# Patient Record
Sex: Female | Born: 1950 | Race: White | Hispanic: No | Marital: Married | Smoking: Never smoker
Health system: Southern US, Community
[De-identification: ages and names within clinical notes are randomized; demographics above are authoritative.]

## PROBLEM LIST (undated history)

## (undated) DIAGNOSIS — I209 Angina pectoris, unspecified: Secondary | ICD-10-CM

## (undated) DIAGNOSIS — R0600 Dyspnea, unspecified: Secondary | ICD-10-CM

## (undated) DIAGNOSIS — F32A Depression, unspecified: Secondary | ICD-10-CM

## (undated) DIAGNOSIS — K7460 Unspecified cirrhosis of liver: Secondary | ICD-10-CM

## (undated) DIAGNOSIS — Z8489 Family history of other specified conditions: Secondary | ICD-10-CM

## (undated) DIAGNOSIS — R600 Localized edema: Secondary | ICD-10-CM

## (undated) DIAGNOSIS — M797 Fibromyalgia: Secondary | ICD-10-CM

## (undated) DIAGNOSIS — E039 Hypothyroidism, unspecified: Secondary | ICD-10-CM

## (undated) DIAGNOSIS — Z8719 Personal history of other diseases of the digestive system: Secondary | ICD-10-CM

## (undated) DIAGNOSIS — I219 Acute myocardial infarction, unspecified: Secondary | ICD-10-CM

## (undated) DIAGNOSIS — F329 Major depressive disorder, single episode, unspecified: Secondary | ICD-10-CM

## (undated) DIAGNOSIS — I10 Essential (primary) hypertension: Secondary | ICD-10-CM

## (undated) DIAGNOSIS — E7800 Pure hypercholesterolemia, unspecified: Secondary | ICD-10-CM

## (undated) DIAGNOSIS — K219 Gastro-esophageal reflux disease without esophagitis: Secondary | ICD-10-CM

## (undated) DIAGNOSIS — R51 Headache: Secondary | ICD-10-CM

## (undated) DIAGNOSIS — I2510 Atherosclerotic heart disease of native coronary artery without angina pectoris: Secondary | ICD-10-CM

## (undated) DIAGNOSIS — I509 Heart failure, unspecified: Secondary | ICD-10-CM

## (undated) DIAGNOSIS — J449 Chronic obstructive pulmonary disease, unspecified: Secondary | ICD-10-CM

## (undated) DIAGNOSIS — R112 Nausea with vomiting, unspecified: Secondary | ICD-10-CM

## (undated) DIAGNOSIS — R011 Cardiac murmur, unspecified: Secondary | ICD-10-CM

## (undated) DIAGNOSIS — Z9889 Other specified postprocedural states: Secondary | ICD-10-CM

## (undated) HISTORY — PX: KNEE SURGERY: SHX244

## (undated) HISTORY — PX: CHOLECYSTECTOMY: SHX55

---

## 1999-12-03 ENCOUNTER — Emergency Department (HOSPITAL_COMMUNITY): Admission: EM | Admit: 1999-12-03 | Discharge: 1999-12-03 | Payer: Self-pay | Admitting: *Deleted

## 1999-12-03 ENCOUNTER — Encounter: Payer: Self-pay | Admitting: Emergency Medicine

## 1999-12-04 ENCOUNTER — Encounter: Payer: Self-pay | Admitting: *Deleted

## 2000-01-14 ENCOUNTER — Other Ambulatory Visit: Admission: RE | Admit: 2000-01-14 | Discharge: 2000-01-14 | Payer: Self-pay | Admitting: Obstetrics and Gynecology

## 2001-02-12 ENCOUNTER — Other Ambulatory Visit: Admission: RE | Admit: 2001-02-12 | Discharge: 2001-02-12 | Payer: Self-pay | Admitting: Obstetrics and Gynecology

## 2002-03-08 ENCOUNTER — Encounter: Admission: RE | Admit: 2002-03-08 | Discharge: 2002-03-08 | Payer: Self-pay | Admitting: Family Medicine

## 2002-03-08 ENCOUNTER — Encounter: Payer: Self-pay | Admitting: Family Medicine

## 2005-04-01 ENCOUNTER — Other Ambulatory Visit: Admission: RE | Admit: 2005-04-01 | Discharge: 2005-04-01 | Payer: Self-pay | Admitting: Family Medicine

## 2006-04-04 ENCOUNTER — Encounter: Admission: RE | Admit: 2006-04-04 | Discharge: 2006-04-04 | Payer: Self-pay | Admitting: Family Medicine

## 2006-04-18 ENCOUNTER — Emergency Department (HOSPITAL_COMMUNITY): Admission: EM | Admit: 2006-04-18 | Discharge: 2006-04-18 | Payer: Self-pay | Admitting: Emergency Medicine

## 2006-05-21 ENCOUNTER — Encounter: Admission: RE | Admit: 2006-05-21 | Discharge: 2006-05-21 | Payer: Self-pay

## 2006-10-03 ENCOUNTER — Other Ambulatory Visit: Admission: RE | Admit: 2006-10-03 | Discharge: 2006-10-03 | Payer: Self-pay | Admitting: Family Medicine

## 2007-06-24 ENCOUNTER — Encounter: Admission: RE | Admit: 2007-06-24 | Discharge: 2007-06-24 | Payer: Self-pay | Admitting: Family Medicine

## 2007-11-19 ENCOUNTER — Encounter: Admission: RE | Admit: 2007-11-19 | Discharge: 2007-11-19 | Payer: Self-pay | Admitting: Family Medicine

## 2007-12-28 ENCOUNTER — Other Ambulatory Visit: Admission: RE | Admit: 2007-12-28 | Discharge: 2007-12-28 | Payer: Self-pay | Admitting: Family Medicine

## 2008-01-03 ENCOUNTER — Emergency Department (HOSPITAL_COMMUNITY): Admission: EM | Admit: 2008-01-03 | Discharge: 2008-01-03 | Payer: Self-pay | Admitting: Emergency Medicine

## 2008-01-14 ENCOUNTER — Encounter: Admission: RE | Admit: 2008-01-14 | Discharge: 2008-01-14 | Payer: Self-pay | Admitting: Family Medicine

## 2009-02-20 ENCOUNTER — Other Ambulatory Visit: Admission: RE | Admit: 2009-02-20 | Discharge: 2009-02-20 | Payer: Self-pay | Admitting: Family Medicine

## 2009-12-05 ENCOUNTER — Emergency Department (HOSPITAL_COMMUNITY): Admission: EM | Admit: 2009-12-05 | Discharge: 2009-12-05 | Payer: Self-pay | Admitting: Emergency Medicine

## 2009-12-20 ENCOUNTER — Encounter: Admission: RE | Admit: 2009-12-20 | Discharge: 2009-12-20 | Payer: Self-pay | Admitting: Sports Medicine

## 2010-01-04 ENCOUNTER — Encounter: Admission: RE | Admit: 2010-01-04 | Discharge: 2010-01-04 | Payer: Self-pay | Admitting: Sports Medicine

## 2010-06-05 ENCOUNTER — Encounter: Admission: RE | Admit: 2010-06-05 | Discharge: 2010-06-05 | Payer: Self-pay | Admitting: Family Medicine

## 2010-10-25 ENCOUNTER — Encounter: Admission: RE | Admit: 2010-10-25 | Discharge: 2010-10-25 | Payer: Self-pay | Admitting: Neurosurgery

## 2011-04-23 NOTE — Consult Note (Signed)
NAME:  Denise Cuevas, Denise Cuevas NO.:  [id number]   MEDICAL RECORD NO.:  [id number]          PATIENT TYPE:  EMS   LOCATION:  ED                            FACILITY:  [facility]   PHYSICIAN:  Skeet Latch, DO    DATE OF BIRTH:  [birth year]   DATE OF CONSULTATION:  01/03/2008  DATE OF DISCHARGE:                                 CONSULTATION   CHIEF COMPLAINT:  Chest pain.   HISTORY OF PRESENT ILLNESS:  This is a 60 year old Caucasian female who  presents with chest pain that started today.  The patient stated it was  a heaviness type sensation with no radiation and it was constant in  nature.  I believe that the patient was at church during this episode.  The patient states she has had this two other times and one other time  she went to her primary care physician's office and had an EKG that was  normal.  Next time she had it she went to [facility] and had an  EKG and blood work that also was normal.  The patient has no cardiac  history.   PAST MEDICAL HISTORY:  The only past medical history includes GERD and a  history of pneumonia.  Newly diagnosed with diabetes.   ALLERGIES:  She has no known drug allergies.   SOCIAL HISTORY:  No smoking, no drinking, no drug abuse.   REVIEW OF SYSTEMS:  Is positive for chest discomfort.  Denies any  shortness of breath, wheezing, any GI or GU complaints.   LABS:  Sodium 138, potassium 4.0, chloride 105, CO2 28, glucose 177, BUN  13, creatinine 0.79, myoglobin 71.4, troponin less than 0.05.  CK-MB is  less than 1.  The patient's chest x-ray was normal.  Last vital signs  showed a blood pressure 120/77, pulse 80, temperature 98, respirations  of 20.   ASSESSMENT AND PLAN:  Chest pain.  The patient will be sent home at this  time.  We will continue her previous medications which includes  metformin and Synthroid.  The patient has no cardiac history.  She is  stable enough to be discharged.  We will set her up for an outpatient  stress test preferably tomorrow with [facility].  We will call  [facility] in the morning and set up an appointment for the  patient.  The patient was instructed if she does not hear over the next  24-48 hours with appointment she is to call the emergency room regarding  appointment with cardiology for stress test.     Skeet Latch, DO  Electronically Signed    SM/MEDQ  D:  01/03/2008  T:  01/03/2008  Job:  [id number]

## 2011-07-09 ENCOUNTER — Other Ambulatory Visit: Payer: Self-pay | Admitting: Family Medicine

## 2011-07-09 DIAGNOSIS — Z1231 Encounter for screening mammogram for malignant neoplasm of breast: Secondary | ICD-10-CM

## 2011-07-18 ENCOUNTER — Ambulatory Visit
Admission: RE | Admit: 2011-07-18 | Discharge: 2011-07-18 | Disposition: A | Discharge status: Home / Self Care | Payer: BC Managed Care – PPO | Source: Ambulatory Visit | Attending: Family Medicine | Admitting: Family Medicine

## 2011-07-18 DIAGNOSIS — Z1231 Encounter for screening mammogram for malignant neoplasm of breast: Secondary | ICD-10-CM

## 2011-07-23 ENCOUNTER — Other Ambulatory Visit: Payer: Self-pay | Admitting: Family Medicine

## 2011-07-23 DIAGNOSIS — R928 Other abnormal and inconclusive findings on diagnostic imaging of breast: Secondary | ICD-10-CM

## 2011-07-29 ENCOUNTER — Ambulatory Visit
Admission: RE | Admit: 2011-07-29 | Discharge: 2011-07-29 | Disposition: A | Discharge status: Home / Self Care | Payer: BC Managed Care – PPO | Source: Ambulatory Visit | Attending: Family Medicine | Admitting: Family Medicine

## 2011-07-29 ENCOUNTER — Other Ambulatory Visit: Payer: Self-pay | Admitting: Family Medicine

## 2011-07-29 DIAGNOSIS — R928 Other abnormal and inconclusive findings on diagnostic imaging of breast: Secondary | ICD-10-CM

## 2011-07-29 DIAGNOSIS — R921 Mammographic calcification found on diagnostic imaging of breast: Secondary | ICD-10-CM

## 2011-07-31 ENCOUNTER — Ambulatory Visit
Admission: RE | Admit: 2011-07-31 | Discharge: 2011-07-31 | Disposition: A | Discharge status: Home / Self Care | Payer: BC Managed Care – PPO | Source: Ambulatory Visit | Attending: Family Medicine | Admitting: Family Medicine

## 2011-07-31 ENCOUNTER — Other Ambulatory Visit: Payer: Self-pay | Admitting: Radiology

## 2011-07-31 DIAGNOSIS — R921 Mammographic calcification found on diagnostic imaging of breast: Secondary | ICD-10-CM

## 2011-08-30 LAB — BASIC METABOLIC PANEL
BUN: 13
CO2: 28
Chloride: 105
GFR calc Af Amer: >60
Sodium: 138

## 2011-08-30 LAB — POCT CARDIAC MARKERS: Troponin i, poc: <0.05

## 2011-11-09 DIAGNOSIS — I219 Acute myocardial infarction, unspecified: Secondary | ICD-10-CM | POA: Insufficient documentation

## 2011-11-09 HISTORY — DX: Acute myocardial infarction, unspecified: I21.9

## 2011-11-18 ENCOUNTER — Other Ambulatory Visit: Payer: Self-pay | Admitting: Cardiology

## 2011-11-18 NOTE — H&P (Signed)
  Patient: Denise Cuevas, Denise Cuevas Provider: Shomari Scicchitano, MD  DOB: 12/08/1951   Age: 60 Y   Sex: Female Date: 11/18/2011  Phone: [phone]  Address: [address], [place], [place]  Pcp: CAMMIE FULP    --------------------------------------------------------------------------------  Subjective:    CC:      1. CONSULTATION AT THE REQUEST OF DR CAMMIE FULP EVALUATE CHEST PAIN.      HPI:     General:             60-year-old with diabetes, hypertension, hyperlipidemia, strong family history of CAD with her father dying in his early 50s here for the evaluation of chest pain. Across chest wall, up into jaw, nose. Both arms radiation. Squeezing. Worse than BP cuff. Felt nausea at times, diaphoresis. Walking dog, increases with activity. Feels like choking. Ususally lasts about 30 min. Sometimes 10-15min. Started in early November.   Mother had CAD, PVD, double amputee.   She has a child with autism. Has a sister with CAD. Age 71. Father died in early 50's with CAD.   .      ROS:      The other elements of the review of systems are negative (12 total elements).     Medical History: GERD, Depression (1998), Hypothyroidism (2009), Type 2 diabetes mellitus (Dr. Kerr), Asthma (since childhood), fibromyalgia (dx'd by Dr. Rowe 2009)--pain much improved on Cymbalta, Bilateral carpal tunnel syndrome (s/p release on right), Hyperlipidemia, h/o vitamin D deficiency--s/p Rx treatment in 2009, normal ABI's 12/08, left L5-S1 and right L5-S1 herniated discs (8/08; Dr. Botero)--pain resolved, Hair loss (seeing derm--started iron 2010), Chest pain started 3 wks ago, Peripheral vision problem.      Gyn History:       Periods :  postmenopausal, approx age 47.       Last pap smear date  02/2009.       Last mammogram date  01/2008.       Abnormal pap smear  no.       STD  none.       OB History:       Number of pregnancies  0; 2 adopted children.       Family History:  Father: deceased MI;  COPD, HTN Mother: deceased arteriosclerosis, colon polyps (in her 60's) Paternal Grand Father: deceased Unknown Paternal Grand Mother: deceased Unknown Maternal Grand Father: deceased Unknown Maternal Grand Mother: deceased Unknown Sister 1: deceased ovarian cancer Sister 2: deceased CVA, HTN Sister 3: alive HTN, asthma 3 sister(s) .      Social History:      General:          History of smoking  cigarettes: Never smoked.           no Smoking.          no Alcohol.          Caffeine: yes, 4 servings per day.          no Recreational drug use.          Exercise: yes, daily.          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          Marital Status: married.          Children: 2 adopted children (23 yo daughter, autistic; 18 yo son, learning disabled).      Medications: Symbicort 160-4.5 MCG/ACT Aerosol 2 puffs once daily, Cymbalta 30 MG Capsule Delayed Release Particles 3 capsules Once a day, Celebrex 100   mg Capsule 1 capsule prn, Omeprazole-Sodium Bicarbonate 40-1100 MG Capsule 1 capsule on an empty stomach Once a day, Aspirin 325 MG Tablet 1 tablet Once a day, Atenolol 25 MG Tablet 1 tablet Once a day, Pravastatin Sodium 20 MG Tablet 1 tablet Once a day, Lantus SoloStar 100 UNIT/ML Solution 20 units Once a day, Metformin XR 500 MG 500 MG Tablet Extended Release 24 Hour 3 tablets Once a day, Vitamin D (Ergocalciferol) 50000 UNIT Capsule 1 capsule Once a Week, Medication List reviewed and reconciled with the patient     Allergies: Crestor: Nausea, vomiting: Side Effects, Byetta 10 MCG Pen: excessive weight loss, nausea, vomiting: Side Effects.      Objective:    Vitals: Wt 166, Ht 61, BMI 31.36, Pulse sitting 80, BP sitting 114/64.     Examination:     General Examination:         GENERAL APPEARANCE alert, oriented, NAD, pleasant.          SKIN: normal, no rash.          HEENT: normal.          HEAD: [place]/AT.          EYES: EOMI, Conjunctiva clear.          NECK: supple, FROM,  without evidence of thyromegaly, adenopathy, +carotid bruits L>R, no jugular venous distention (JVD).          LUNGS: clear to auscultation bilaterally, no wheezes, rhonchi, rales, regular breathing rate and effort.          HEART: regular rate and rhythm, no S3, S4, 2/6 systolic murmur RUSB, point of maximul impulse (PMI) normal.          ABDOMEN: soft, non-tender/non-distended, bowel sounds present, no masses palpated, no bruit.          EXTREMITIES: no clubbing, no edema, pulses 2 plus bilaterally.          NEUROLOGIC EXAM: non-focal exam, alert and oriented x 3.          PERIPHERAL PULSES: normal (2+) bilaterally.          LYMPH NODES: no cervical adenopathy.          PSYCH affect normal.            Assessment:    Assessment:  1. Chest pain - 786.50 (Primary)   2. Diabetes 2, Controlled - 250.00   3. Hypercholesterolemia, Mixed - 272.2   4. BP Borderline - 796.2   5. Bruit - 785.9   6. Murmur, heart - 785.2     Plan:    1. Chest pain        LAB: BASIC METABOLIC      GLUCOSE 137 70-99 - MG/DL H       BUN 13 6-26 - MG/DL        CREATININE 0.67 0.60-1.30 - MG/DL        eGFR (NON-AFRICAN AMERICAN) 90.40 >60.00 - ML/MIN        eGFR (AFRICAN AMERICAN) 109.39 >60.00 - ML/MIN        SODIUM 140 136-145 - MMOL/L        POTASSIUM 4.4 3.5-5.5 - MMOL/L        CHLORIDE 103 98-107 - MMOL/L        C02 31 22-32 - MMOL/L        ANION GAP 10.4 6.0-20.0 - MMOL/L        CALCIUM 9.7 8.6-10.3 - MG/DL           LAB: CBC WITH DIFF     WBC 5.2 4.0-11.0 - K/uL        RBC 4.64 4.20-5.40 - M/uL        HGB 14.7 12.0-16.0 - g/dL        HCT 42.2 37.0-47.0 - %        MCV 91.1 81.0-99.0 - fL        MCHC 34.9 32.0-36.0 - g/dL        RDW 12.1 11.5-15.5 - %         PLT 108 150-400 - K/uL L       NEUT % 44.9 43.3-71.9 - %        LYMPH% 40.6 16.8-43.5 - %        MONO % 9.5 4.6-12.4 - %        EOS % 3.70 0.00-7.80 - %         BASO % 1.3 0.0-1.0 - % H       NEUT # 2.3 1.9-7.2 - K/uL        LYMPH# 2.10  1.10-2.73 - K/uL        MONO # 0.5 0.3-0.8 - K/uL        EOS # 0.20 0.00-0.60 - K/uL        BASO # 0.10 0.00-0.10 - K/uL         LAB: PT/INR in Office  subtherapeutic     Protime 12.6        INR 1.1               Smart,Jeremy 11/18/2011 12:18:28 PM > okay for cath   Concerning story for chest discomfort. Seems to be getting worse. More severe. Increases with activity, relieved with rest. Classic angina. I will be pursuing cardiac catheterization. Risks and benefits of cardiac catheterization have been reviewed including risk of stroke, heart attack, death, bleeding, renal impariment and arterial damage. There was ample oppurtuny to answer questions. Alternatives were discussed. Patient understands and wishes to proceed. MC lab, radial.       2. Hypercholesterolemia, Mixed   Continue with statin. LDL goal less than 100.       3. Bruit   Diagnostic Imaging:[facility] Carotid Doppler (Ordered for 11/18/2011)       4. Murmur, heart   Diagnostic Imaging:[facility] Echocardiogram (Ordered for 11/18/2011)    Systolic murmur.          Immunizations:       Labs:      Procedure Codes: 80048 ECL BMP, 36415 BLOOD COLLECTION ROUTINE VENIPUNCTURE, 85025 ECL CBC PLATELET DIFF, 85610 PROTHROMBIN TIME     Preventive:          CC: Dr. Fulp.     Follow Up: post cath        Provider: Tanaka Gillen, MD  Patient: Denise Cuevas, Denise Cuevas  DOB: 08/10/1951  Date: 11/18/2011   

## 2011-11-19 ENCOUNTER — Ambulatory Visit (HOSPITAL_COMMUNITY)
Admission: RE | Admit: 2011-11-19 | Discharge: 2011-11-19 | Disposition: A | Discharge status: Home / Self Care | Payer: BC Managed Care – PPO | Source: Ambulatory Visit | Attending: Cardiology | Admitting: Cardiology

## 2011-11-19 ENCOUNTER — Encounter (HOSPITAL_COMMUNITY): Payer: Self-pay | Admitting: Cardiology

## 2011-11-19 ENCOUNTER — Encounter (HOSPITAL_COMMUNITY)
Admission: RE | Disposition: A | Discharge status: Home / Self Care | Payer: Self-pay | Source: Ambulatory Visit | Attending: Cardiology

## 2011-11-19 DIAGNOSIS — I10 Essential (primary) hypertension: Secondary | ICD-10-CM | POA: Insufficient documentation

## 2011-11-19 DIAGNOSIS — I2510 Atherosclerotic heart disease of native coronary artery without angina pectoris: Secondary | ICD-10-CM | POA: Insufficient documentation

## 2011-11-19 DIAGNOSIS — R079 Chest pain, unspecified: Secondary | ICD-10-CM | POA: Insufficient documentation

## 2011-11-19 DIAGNOSIS — Z8249 Family history of ischemic heart disease and other diseases of the circulatory system: Secondary | ICD-10-CM | POA: Insufficient documentation

## 2011-11-19 DIAGNOSIS — I209 Angina pectoris, unspecified: Secondary | ICD-10-CM | POA: Diagnosis present

## 2011-11-19 DIAGNOSIS — E1165 Type 2 diabetes mellitus with hyperglycemia: Secondary | ICD-10-CM | POA: Diagnosis present

## 2011-11-19 DIAGNOSIS — IMO0002 Reserved for concepts with insufficient information to code with codable children: Secondary | ICD-10-CM | POA: Diagnosis present

## 2011-11-19 DIAGNOSIS — E785 Hyperlipidemia, unspecified: Secondary | ICD-10-CM | POA: Insufficient documentation

## 2011-11-19 HISTORY — PX: LEFT HEART CATHETERIZATION WITH CORONARY ANGIOGRAM: SHX5451

## 2011-11-19 LAB — GLUCOSE, CAPILLARY
Glucose-Capillary: 149 mg/dL — ABNORMAL HIGH (ref 70–99)
Glucose-Capillary: 89 mg/dL (ref 70–99)
Glucose-Capillary: 84 mg/dL (ref 70–99)

## 2011-11-19 SURGERY — LEFT HEART CATHETERIZATION WITH CORONARY ANGIOGRAM
Anesthesia: LOCAL

## 2011-11-19 MED ORDER — ONDANSETRON HCL 4 MG/2ML IJ SOLN: 4.0000 mg | Freq: Four times a day (QID) | INTRAMUSCULAR | Status: DC | PRN

## 2011-11-19 MED ORDER — ACETAMINOPHEN 325 MG PO TABS: 650.0000 mg | ORAL_TABLET | ORAL | Status: DC | PRN

## 2011-11-19 MED ORDER — SODIUM CHLORIDE 0.9 % IV SOLN
INTRAVENOUS | Status: DC
  Administered 2011-11-19: 12:00:00 via INTRAVENOUS

## 2011-11-19 MED ORDER — FENTANYL CITRATE 0.05 MG/ML IJ SOLN
INTRAMUSCULAR | Status: AC
  Filled 2011-11-19: qty 2

## 2011-11-19 MED ORDER — SODIUM CHLORIDE 0.9 % IV SOLN: 1.0000 mL/kg/h | INTRAVENOUS | Status: DC

## 2011-11-19 MED ORDER — HEPARIN (PORCINE) IN NACL 2-0.9 UNIT/ML-% IJ SOLN
INTRAMUSCULAR | Status: AC
  Filled 2011-11-19: qty 2000

## 2011-11-19 MED ORDER — DIAZEPAM 5 MG PO TABS
5.0000 mg | ORAL_TABLET | ORAL | Status: AC
  Administered 2011-11-19: 5 mg via ORAL
  Filled 2011-11-19: qty 1

## 2011-11-19 MED ORDER — NITROGLYCERIN 0.2 MG/ML ON CALL CATH LAB
INTRAVENOUS | Status: AC
  Filled 2011-11-19: qty 1

## 2011-11-19 MED ORDER — SODIUM CHLORIDE 0.9 % IJ SOLN: 3.0000 mL | Freq: Two times a day (BID) | INTRAMUSCULAR | Status: DC

## 2011-11-19 MED ORDER — SODIUM CHLORIDE 0.9 % IV SOLN: 250.0000 mL | INTRAVENOUS | Status: DC | PRN

## 2011-11-19 MED ORDER — SODIUM CHLORIDE 0.9 % IJ SOLN: 3.0000 mL | INTRAMUSCULAR | Status: DC | PRN

## 2011-11-19 MED ORDER — LIDOCAINE HCL (PF) 1 % IJ SOLN
INTRAMUSCULAR | Status: AC
  Filled 2011-11-19: qty 30

## 2011-11-19 MED ORDER — MIDAZOLAM HCL 2 MG/2ML IJ SOLN
INTRAMUSCULAR | Status: AC
  Filled 2011-11-19: qty 2

## 2011-11-19 MED ORDER — ASPIRIN 81 MG PO CHEW
324.0000 mg | CHEWABLE_TABLET | ORAL | Status: AC
  Administered 2011-11-19: 324 mg via ORAL
  Filled 2011-11-19: qty 4

## 2011-11-19 NOTE — H&P (View-Only) (Signed)
Patient: Denise Cuevas, Denise Cuevas Provider: Donato Schultz, MD  DOB: 11/27/1951   Age: 60 Y   Sex: Female Date: 11/18/2011  Phone: [phone]  Address: [address] [place], [place]  Pcp: CAMMIE FULP    --------------------------------------------------------------------------------  Subjective:    CC:      1. CONSULTATION AT THE REQUEST OF DR CAMMIE FULP EVALUATE CHEST PAIN.      HPI:     General:             60 year old with diabetes, hypertension, hyperlipidemia, strong family history of CAD with her father dying in his early 79s here for the evaluation of chest pain. Across chest wall, up into jaw, nose. Both arms radiation. Squeezing. Worse than BP cuff. Felt nausea at times, diaphoresis. Walking dog, increases with activity. Feels like choking. Ususally lasts about 30 min. Sometimes 10-37min. Started in early November.   Mother had CAD, PVD, double amputee.   She has a child with autism. Has a sister with CAD. Age 69. Father died in early 55's with CAD.   .      ROS:      The other elements of the review of systems are negative (12 total elements).     Medical History: GERD, Depression (1998), Hypothyroidism (2009), Type 2 diabetes mellitus (Dr. Sharl Ma), Asthma (since childhood), fibromyalgia (dx'd by Dr. Phylliss Bob 2009)--pain much improved on Cymbalta, Bilateral carpal tunnel syndrome (s/p release on right), Hyperlipidemia, h/o vitamin D deficiency--s/p Rx treatment in 2009, normal ABI's 12/08, left L5-S1 and right L5-S1 herniated discs (8/08; Dr. Darrelyn Hillock resolved, Hair loss (seeing derm--started iron 2010), Chest pain started 3 wks ago, Peripheral vision problem.      Gyn History:       Periods :  postmenopausal, approx age 18.       Last pap smear date  02/2009.       Last mammogram date  01/2008.       Abnormal pap smear  no.       STD  none.       OB History:       Number of pregnancies  0; 2 adopted children.       Family History:  Father: deceased MI;  COPD, HTN Mother: deceased arteriosclerosis, colon polyps (in her 100's) Paternal Grand Father: deceased Unknown Paternal Grand Mother: deceased Unknown Maternal Grand Father: deceased Unknown Maternal Grand Mother: deceased Unknown Sister 1: deceased ovarian cancer Sister 2: deceased CVA, HTN Sister 3: alive HTN, asthma 3 sister(s) .      Social History:      General:          History of smoking  cigarettes: Never smoked.           no Smoking.          no Alcohol.          Caffeine: yes, 4 servings per day.          no Recreational drug use.          Exercise: yes, daily.          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          Marital Status: married.          Children: 2 adopted children (51 yo daughter, autistic; 61 yo son, learning disabled).      Medications: Symbicort 160-4.5 MCG/ACT Aerosol 2 puffs once daily, Cymbalta 30 MG Capsule Delayed Release Particles 3 capsules Once a day, Celebrex 100  mg Capsule 1 capsule prn, Omeprazole-Sodium Bicarbonate 40-1100 MG Capsule 1 capsule on an empty stomach Once a day, Aspirin 325 MG Tablet 1 tablet Once a day, Atenolol 25 MG Tablet 1 tablet Once a day, Pravastatin Sodium 20 MG Tablet 1 tablet Once a day, Lantus SoloStar 100 UNIT/ML Solution 20 units Once a day, Metformin XR 500 MG 500 MG Tablet Extended Release 24 Hour 3 tablets Once a day, Vitamin D (Ergocalciferol) 50000 UNIT Capsule 1 capsule Once a Week, Medication List reviewed and reconciled with the patient     Allergies: Crestor: Nausea, vomiting: Side Effects, Byetta 10 MCG Pen: excessive weight loss, nausea, vomiting: Side Effects.      Objective:    Vitals: Wt 166, Ht 61, BMI 31.36, Pulse sitting 80, BP sitting 114/64.     Examination:     General Examination:         GENERAL APPEARANCE alert, oriented, NAD, pleasant.          SKIN: normal, no rash.          HEENT: normal.          HEAD: [place]/AT.          EYES: EOMI, Conjunctiva clear.          NECK: supple, FROM,  without evidence of thyromegaly, adenopathy, +carotid bruits L>R, no jugular venous distention (JVD).          LUNGS: clear to auscultation bilaterally, no wheezes, rhonchi, rales, regular breathing rate and effort.          HEART: regular rate and rhythm, no S3, S4, 2/6 systolic murmur RUSB, point of maximul impulse (PMI) normal.          ABDOMEN: soft, non-tender/non-distended, bowel sounds present, no masses palpated, no bruit.          EXTREMITIES: no clubbing, no edema, pulses 2 plus bilaterally.          NEUROLOGIC EXAM: non-focal exam, alert and oriented x 3.          PERIPHERAL PULSES: normal (2+) bilaterally.          LYMPH NODES: no cervical adenopathy.          PSYCH affect normal.            Assessment:    Assessment:  1. Chest pain - 786.50 (Primary)   2. Diabetes 2, Controlled - 250.00   3. Hypercholesterolemia, Mixed - 272.2   4. BP Borderline - 796.2   5. Bruit - 785.9   6. Murmur, heart - 785.2     Plan:    1. Chest pain        LAB: BASIC METABOLIC      GLUCOSE 137 70-99 - MG/DL H       BUN 13 1-61 - MG/DL        CREATININE 0.96 0.60-1.30 - MG/DL        eGFR (NON-AFRICAN AMERICAN) 90.40 >60.00 - ML/MIN        eGFR (AFRICAN AMERICAN) 109.39 >60.00 - ML/MIN        SODIUM 140 136-145 - MMOL/L        POTASSIUM 4.4 3.5-5.5 - MMOL/L        CHLORIDE 103 98-107 - MMOL/L        C02 31 22-32 - MMOL/L        ANION GAP 10.4 6.0-20.0 - MMOL/L        CALCIUM 9.7 8.6-10.3 - MG/DL  LAB: CBC WITH DIFF     WBC 5.2 4.0-11.0 - K/uL        RBC 4.64 4.20-5.40 - M/uL        HGB 14.7 12.0-16.0 - g/dL        HCT 81.1 91.4-78.2 - %        MCV 91.1 81.0-99.0 - fL        MCHC 34.9 32.0-36.0 - g/dL        RDW 95.6 21.3-08.6 - %         PLT 108 150-400 - K/uL L       NEUT % 44.9 43.3-71.9 - %        LYMPH% 40.6 16.8-43.5 - %        MONO % 9.5 4.6-12.4 - %        EOS % 3.70 0.00-7.80 - %         BASO % 1.3 0.0-1.0 - % H       NEUT # 2.3 1.9-7.2 - K/uL        LYMPH# 2.10  1.10-2.73 - K/uL        MONO # 0.5 0.3-0.8 - K/uL        EOS # 0.20 0.00-0.60 - K/uL        BASO # 0.10 0.00-0.10 - K/uL         LAB: PT/INR in Office  subtherapeutic     Protime 12.6        INR 1.1               Smart,Jeremy 11/18/2011 12:18:28 PM > okay for cath   Concerning story for chest discomfort. Seems to be getting worse. More severe. Increases with activity, relieved with rest. Classic angina. I will be pursuing cardiac catheterization. Risks and benefits of cardiac catheterization have been reviewed including risk of stroke, heart attack, death, bleeding, renal impariment and arterial damage. There was ample oppurtuny to answer questions. Alternatives were discussed. Patient understands and wishes to proceed. MC lab, radial.       2. Hypercholesterolemia, Mixed   Continue with statin. LDL goal less than 100.       3. Bruit   Diagnostic Imaging:[facility] Carotid Doppler (Ordered for 11/18/2011)       4. Murmur, heart   Diagnostic Imaging:[facility] Echocardiogram (Ordered for 11/18/2011)    Systolic murmur.          Immunizations:       Labs:      Procedure Codes: 57846 ECL BMP, [id number] BLOOD COLLECTION ROUTINE VENIPUNCTURE, 85025 ECL CBC PLATELET DIFF, 85610 PROTHROMBIN TIME     Preventive:          CC: Dr. Jillyn Hidden.     Follow Up: post cath        Provider: Donato Schultz, MD  Patient: Denise Cuevas, Denise Cuevas  DOB: 1951/03/27  Date: 11/18/2011

## 2011-11-19 NOTE — Interval H&P Note (Signed)
History and Physical Interval Note:  11/19/2011 11:40 AM  Denise Cuevas  has presented today for surgery, with the diagnosis of chest pain  The various methods of treatment have been discussed with the patient and family. After consideration of risks, benefits and other options for treatment, the patient has consented to  Procedure(s): LEFT HEART CATHETERIZATION WITH CORONARY ANGIOGRAM as a surgical intervention .  The patients' history has been reviewed, patient examined, no change in status, stable for surgery.  I have reviewed the patients' chart and labs.  Questions were answered to the patient's satisfaction.     [name]

## 2011-11-19 NOTE — Op Note (Signed)
PROCEDURE:  Left heart catheterization with selective coronary angiography, left ventriculogram via the radial artery approach.  INDICATIONS:  60 year old diabetic with hypertension, hyperlipidemia, strong family history of CAD with her father dying in his early 28s with classic anginal symptoms, radiating and left arm across her chest wall up to jaw, nose, neck. Squeezing. Felt nausea at times as well as diaphoresis and this was brought about with exertion. Walking her dog for instance. Feels like a choking sensation. Usually lasts about 30 minutes duration at its worst and subsides with rest. This all started in early November. Her mother also had CAD, PVD with AA and PT. She has a lot of stress in her life at the child with autism. She also has carotid bruits which are currently being evaluated.  The risks, benefits, and details of the procedure were explained to the patient, including possibilities of stroke, heart attack, death, renal impairment, arterial damage, bleeding.  The patient verbalized understanding and wanted to proceed.  Informed written consent was obtained.  PROCEDURE TECHNIQUE:  Allen's test was performed pre-and post procedure and was normal. The right radial artery site was prepped and draped in a sterile fashion. One percent lidocaine was used for local anesthesia. Using the modified Seldinger technique a 5 French hydrophilic sheath was inserted into the radial artery without difficulty. 3 mg of verapamil was administered via the sheath. A Tiger with the guidance of a Versicore wire was placed in the right coronary cusp and selectively cannulated the right coronary artery. After traversing the aortic arch, 3800 units of heparin IV was administered. A Judkins left #3.5 catheter was used to selectively cannulate the left main artery.  In fact, she had dual ostia of LAD and circumflex. Multiple views with hand injection of Omnipaque were obtained. Catheter a pigtail catheter was used to  cross into the left ventricle, hemodynamics were obtained, and a left ventriculogram was performed in the RAO position with power injection. Following the procedure, sheath was removed, patient was hemodynamically stable, hemostasis was maintained with a Terumo T band.   CONTRAST:  Total of 105 ml.  FLOUROSCOPY TIME: 10 min.  COMPLICATIONS:  None.    HEMODYNAMICS:  Aortic pressure was 125/69 with a mean of 92 ; LV pressure was 126/14/20; LVEDP  20 mmHg.  There was no gradient between the left ventricle and aorta.    ANGIOGRAPHIC DATA:    Left main: There truly was no left main. Dual ostia.   Left anterior descending (LAD):  after the bifurcation of a large diagonal branch, there was significant and diffuse disease in the mid LAD segment likely over 40 mm of vessel and a focal lesion distally in the LAD as well of up to 70%. The mid LAD long segment at points is up to 80-90% significant stenosis. Nitroglycerin was administered and did not alter the confirmation of the vessel dramatically.   Circumflex artery (CIRC):  It was difficult to selectively cannulate the ostia of the circumflex. She did however have a focal 90% proximal obtuse marginal lesion.   Right coronary artery (RCA):  This is the dominant vessel giving rise to the PDA which is a small caliber vessel.There is no significant CAD present.   LEFT VENTRICULOGRAM:  Left ventricular angiogram was done in the 30 RAO projection and revealed normal left ventricular wall motion and systolic function with an estimated ejection fraction of  65 %.  LVEDP was  20  mmHg.  IMPRESSIONS:  Severe coronary artery disease of the mid  LAD just after the bifurcation of a large diagonal branch, long segment of stenosis of up to 80-90%. Focal distal LAD lesion of up to 70%. Focal obtuse marginal lesion of 90%.       2.Normal left ventricular systolic function.  LVEDP  20  mmHg.  Ejection fraction 65 %.  RECOMMENDATION:  I discussed the case with the  patient as well as Dr. Verdis Prime. Given her diabetic status, extensively diseased mid LAD, I will refer to cardiothoracic surgery for discussion of bypass surgery. If percutaneous intervention were to take place, she would need a focal stent in the obtuse marginal and a long LAD stent which would jail the first diagonal branch. This could lead to repeat procedures in the future.   Marland Kitchen

## 2011-11-25 ENCOUNTER — Encounter (HOSPITAL_COMMUNITY): Payer: Self-pay | Admitting: *Deleted

## 2011-11-25 ENCOUNTER — Other Ambulatory Visit: Payer: Self-pay

## 2011-11-25 ENCOUNTER — Inpatient Hospital Stay (HOSPITAL_COMMUNITY)
Admission: AD | Admit: 2011-11-25 | Discharge: 2011-12-02 | DRG: 109 | Disposition: A | Discharge status: Home / Self Care | Payer: BC Managed Care – PPO | Attending: Cardiothoracic Surgery | Admitting: Cardiothoracic Surgery

## 2011-11-25 ENCOUNTER — Inpatient Hospital Stay (HOSPITAL_COMMUNITY): Payer: BC Managed Care – PPO

## 2011-11-25 ENCOUNTER — Emergency Department (HOSPITAL_COMMUNITY): Payer: BC Managed Care – PPO

## 2011-11-25 DIAGNOSIS — IMO0001 Reserved for inherently not codable concepts without codable children: Secondary | ICD-10-CM | POA: Diagnosis present

## 2011-11-25 DIAGNOSIS — I214 Non-ST elevation (NSTEMI) myocardial infarction: Secondary | ICD-10-CM | POA: Diagnosis present

## 2011-11-25 DIAGNOSIS — I2510 Atherosclerotic heart disease of native coronary artery without angina pectoris: Principal | ICD-10-CM

## 2011-11-25 DIAGNOSIS — E669 Obesity, unspecified: Secondary | ICD-10-CM | POA: Diagnosis present

## 2011-11-25 DIAGNOSIS — E119 Type 2 diabetes mellitus without complications: Secondary | ICD-10-CM | POA: Diagnosis present

## 2011-11-25 DIAGNOSIS — Z794 Long term (current) use of insulin: Secondary | ICD-10-CM

## 2011-11-25 DIAGNOSIS — D72829 Elevated white blood cell count, unspecified: Secondary | ICD-10-CM | POA: Diagnosis present

## 2011-11-25 DIAGNOSIS — I10 Essential (primary) hypertension: Secondary | ICD-10-CM | POA: Diagnosis present

## 2011-11-25 DIAGNOSIS — E039 Hypothyroidism, unspecified: Secondary | ICD-10-CM | POA: Diagnosis present

## 2011-11-25 DIAGNOSIS — Z7982 Long term (current) use of aspirin: Secondary | ICD-10-CM

## 2011-11-25 DIAGNOSIS — D696 Thrombocytopenia, unspecified: Secondary | ICD-10-CM | POA: Diagnosis present

## 2011-11-25 DIAGNOSIS — E7800 Pure hypercholesterolemia, unspecified: Secondary | ICD-10-CM | POA: Diagnosis present

## 2011-11-25 DIAGNOSIS — I200 Unstable angina: Secondary | ICD-10-CM

## 2011-11-25 HISTORY — DX: Personal history of other diseases of the digestive system: Z87.19

## 2011-11-25 HISTORY — DX: Essential (primary) hypertension: I10

## 2011-11-25 HISTORY — DX: Angina pectoris, unspecified: I20.9

## 2011-11-25 HISTORY — DX: Fibromyalgia: M79.7

## 2011-11-25 HISTORY — DX: Gastro-esophageal reflux disease without esophagitis: K21.9

## 2011-11-25 HISTORY — DX: Headache: R51

## 2011-11-25 HISTORY — DX: Depression, unspecified: F32.A

## 2011-11-25 HISTORY — DX: Major depressive disorder, single episode, unspecified: F32.9

## 2011-11-25 HISTORY — DX: Atherosclerotic heart disease of native coronary artery without angina pectoris: I25.10

## 2011-11-25 HISTORY — DX: Pure hypercholesterolemia, unspecified: E78.00

## 2011-11-25 HISTORY — DX: Hypothyroidism, unspecified: E03.9

## 2011-11-25 LAB — MRSA PCR SCREENING: MRSA by PCR: NEGATIVE

## 2011-11-25 LAB — BASIC METABOLIC PANEL
Calcium: 9.6 mg/dL (ref 8.4–10.5)
Creatinine, Ser: 0.75 mg/dL (ref 0.50–1.10)
GFR calc Af Amer: >90 mL/min (ref >90)
GFR calc non Af Amer: >90 mL/min (ref >90)

## 2011-11-25 LAB — COMPREHENSIVE METABOLIC PANEL
BUN: 17 mg/dL (ref 6–23)
Calcium: 8.8 mg/dL (ref 8.4–10.5)
Creatinine, Ser: 0.66 mg/dL (ref 0.50–1.10)
GFR calc Af Amer: >90 mL/min (ref >90)
Glucose, Bld: 238 mg/dL — ABNORMAL HIGH (ref 70–99)
Total Protein: 6 g/dL (ref 6.0–8.3)

## 2011-11-25 LAB — CBC
MCH: 30.9 pg (ref 26.0–34.0)
MCV: 88.4 fL (ref 78.0–100.0)
Platelets: 109 10*3/uL — ABNORMAL LOW (ref 150–400)
Platelets: 115 10*3/uL — ABNORMAL LOW (ref 150–400)
RBC: 4.24 MIL/uL (ref 3.87–5.11)
RDW: 13 % (ref 11.5–15.5)
WBC: 7.1 10*3/uL (ref 4.0–10.5)

## 2011-11-25 LAB — GLUCOSE, CAPILLARY
Glucose-Capillary: 130 mg/dL — ABNORMAL HIGH (ref 70–99)
Glucose-Capillary: 105 mg/dL — ABNORMAL HIGH (ref 70–99)

## 2011-11-25 LAB — POCT I-STAT, CHEM 8
Calcium, Ion: 1.14 mmol/L (ref 1.12–1.32)
HCT: 41 % (ref 36.0–46.0)
TCO2: 28 mmol/L (ref 0–100)

## 2011-11-25 LAB — APTT: aPTT: 30 seconds (ref 24–37)

## 2011-11-25 LAB — CARDIAC PANEL(CRET KIN+CKTOT+MB+TROPI)
CK, MB: 12.5 ng/mL (ref 0.3–4.0)
CK, MB: 9.6 ng/mL (ref 0.3–4.0)
Relative Index: INVALID (ref 0.0–2.5)
Total CK: 96 U/L (ref 7–177)
Total CK: 77 U/L (ref 7–177)
Troponin I: 2.12 ng/mL (ref <0.30)

## 2011-11-25 LAB — HEPARIN LEVEL (UNFRACTIONATED): Heparin Unfractionated: 0.3 IU/mL (ref 0.30–0.70)

## 2011-11-25 LAB — DIFFERENTIAL
Basophils Absolute: 0 10*3/uL (ref 0.0–0.1)
Basophils Relative: 0 % (ref 0–1)
Eosinophils Absolute: 0.2 10*3/uL (ref 0.0–0.7)
Eosinophils Relative: 2 % (ref 0–5)
Lymphocytes Relative: 22 % (ref 12–46)
Monocytes Absolute: 0.5 10*3/uL (ref 0.1–1.0)
Monocytes Relative: 7 % (ref 3–12)
Neutro Abs: 4.9 10*3/uL (ref 1.7–7.7)
Neutrophils Relative %: 47 % (ref 43–77)

## 2011-11-25 LAB — PROTIME-INR
INR: 1.05 (ref 0.00–1.49)
Prothrombin Time: 13.9 seconds (ref 11.6–15.2)
Prothrombin Time: 14.5 seconds (ref 11.6–15.2)

## 2011-11-25 LAB — HEMOGLOBIN A1C: Hgb A1c MFr Bld: 8.4 % — ABNORMAL HIGH (ref <5.7)

## 2011-11-25 LAB — POCT I-STAT TROPONIN I: Troponin i, poc: 0.17 ng/mL (ref 0.00–0.08)

## 2011-11-25 MED ORDER — BUDESONIDE-FORMOTEROL FUMARATE 160-4.5 MCG/ACT IN AERO: 2.0000 | INHALATION_SPRAY | Freq: Two times a day (BID) | RESPIRATORY_TRACT | Status: DC | PRN

## 2011-11-25 MED ORDER — ASPIRIN EC 81 MG PO TBEC
81.0000 mg | DELAYED_RELEASE_TABLET | Freq: Every day | ORAL | Status: DC
  Filled 2011-11-25: qty 1

## 2011-11-25 MED ORDER — METFORMIN HCL ER 750 MG PO TB24
1500.0000 mg | ORAL_TABLET | Freq: Every day | ORAL | Status: DC
  Administered 2011-11-25 – 2011-11-26 (×2): 1500 mg via ORAL
  Filled 2011-11-25 (×5): qty 2

## 2011-11-25 MED ORDER — HYDROCORTISONE SOD SUCCINATE 100 MG IJ SOLR: 100.0000 mg | Freq: Three times a day (TID) | INTRAMUSCULAR | Status: DC

## 2011-11-25 MED ORDER — SODIUM CHLORIDE 0.9 % IV SOLN
2.0000 mg/h | INTRAVENOUS | Status: DC
  Filled 2011-11-25: qty 10

## 2011-11-25 MED ORDER — SIMVASTATIN 10 MG PO TABS
10.0000 mg | ORAL_TABLET | Freq: Every day | ORAL | Status: DC
  Administered 2011-11-25 – 2011-11-26 (×2): 10 mg via ORAL
  Filled 2011-11-25 (×3): qty 1

## 2011-11-25 MED ORDER — HEPARIN (PORCINE) IN NACL 100-0.45 UNIT/ML-% IJ SOLN
12.0000 [IU]/kg/h | INTRAMUSCULAR | Status: DC
  Administered 2011-11-25 (×2): 12 [IU]/kg/h via INTRAVENOUS
  Filled 2011-11-25 (×2): qty 250

## 2011-11-25 MED ORDER — ATENOLOL 25 MG PO TABS
25.0000 mg | ORAL_TABLET | Freq: Every day | ORAL | Status: DC
  Administered 2011-11-26: 25 mg via ORAL
  Filled 2011-11-25 (×3): qty 1

## 2011-11-25 MED ORDER — SODIUM CHLORIDE 0.9 % IV BOLUS (SEPSIS): 500.0000 mL | Freq: Once | INTRAVENOUS | Status: DC

## 2011-11-25 MED ORDER — HEPARIN BOLUS VIA INFUSION
4000.0000 [IU] | Freq: Once | INTRAVENOUS | Status: AC
  Administered 2011-11-25: 4000 [IU] via INTRAVENOUS
  Filled 2011-11-25: qty 4000

## 2011-11-25 MED ORDER — INSULIN GLARGINE 100 UNIT/ML ~~LOC~~ SOLN
24.0000 [IU] | Freq: Every day | SUBCUTANEOUS | Status: DC
  Administered 2011-11-25 – 2011-11-26 (×2): 24 [IU] via SUBCUTANEOUS
  Filled 2011-11-25: qty 3

## 2011-11-25 MED ORDER — SODIUM CHLORIDE 0.9 % IV SOLN
Freq: Once | INTRAVENOUS | Status: AC
  Administered 2011-11-25: 03:00:00 via INTRAVENOUS

## 2011-11-25 MED ORDER — NITROGLYCERIN IN D5W 200-5 MCG/ML-% IV SOLN
3.0000 ug/min | INTRAVENOUS | Status: DC
  Administered 2011-11-25: 3 ug/min via INTRAVENOUS

## 2011-11-25 MED ORDER — SODIUM CHLORIDE 0.9 % IV SOLN
250.0000 mL | INTRAVENOUS | Status: DC | PRN
  Administered 2011-11-26: 10 mL via INTRAVENOUS

## 2011-11-25 MED ORDER — LEVOTHYROXINE SODIUM 125 MCG PO TABS
125.0000 ug | ORAL_TABLET | Freq: Every day | ORAL | Status: DC
  Administered 2011-11-26: 125 ug via ORAL
  Filled 2011-11-25 (×3): qty 1

## 2011-11-25 MED ORDER — SODIUM CHLORIDE 0.9 % IJ SOLN: 3.0000 mL | INTRAMUSCULAR | Status: DC | PRN

## 2011-11-25 MED ORDER — NITROGLYCERIN IN D5W 200-5 MCG/ML-% IV SOLN
INTRAVENOUS | Status: AC
  Filled 2011-11-25: qty 250

## 2011-11-25 MED ORDER — PANTOPRAZOLE SODIUM 40 MG PO TBEC
40.0000 mg | DELAYED_RELEASE_TABLET | Freq: Every day | ORAL | Status: DC
  Administered 2011-11-25 – 2011-11-26 (×2): 40 mg via ORAL
  Filled 2011-11-25 (×2): qty 1

## 2011-11-25 MED ORDER — ASPIRIN EC 81 MG PO TBEC
81.0000 mg | DELAYED_RELEASE_TABLET | Freq: Every day | ORAL | Status: DC
  Administered 2011-11-26: 81 mg via ORAL
  Filled 2011-11-25 (×3): qty 1

## 2011-11-25 MED ORDER — DULOXETINE HCL 60 MG PO CPEP
90.0000 mg | ORAL_CAPSULE | Freq: Every day | ORAL | Status: DC
  Administered 2011-11-25 – 2011-11-26 (×2): 90 mg via ORAL
  Filled 2011-11-25 (×3): qty 1

## 2011-11-25 MED ORDER — VITAMIN D (ERGOCALCIFEROL) 1.25 MG (50000 UNIT) PO CAPS: 50000.0000 [IU] | ORAL_CAPSULE | ORAL | Status: DC

## 2011-11-25 MED ORDER — MIDAZOLAM BOLUS VIA INFUSION
1.0000 mg | INTRAVENOUS | Status: DC | PRN
  Filled 2011-11-25: qty 2

## 2011-11-25 MED ORDER — SODIUM CHLORIDE 0.9 % IJ SOLN
3.0000 mL | Freq: Two times a day (BID) | INTRAMUSCULAR | Status: DC
  Administered 2011-11-25 – 2011-11-26 (×3): 3 mL via INTRAVENOUS

## 2011-11-25 MED ORDER — ACETAMINOPHEN 325 MG PO TABS
650.0000 mg | ORAL_TABLET | ORAL | Status: DC | PRN
  Administered 2011-11-25 – 2011-11-26 (×2): 650 mg via ORAL
  Filled 2011-11-25 (×2): qty 2

## 2011-11-25 MED ORDER — ASPIRIN EC 325 MG PO TBEC
325.0000 mg | DELAYED_RELEASE_TABLET | Freq: Every day | ORAL | Status: DC
  Administered 2011-11-25: 325 mg via ORAL
  Filled 2011-11-25 (×2): qty 1

## 2011-11-25 MED ORDER — EPTIFIBATIDE BOLUS VIA INFUSION
180.0000 ug/kg | Freq: Once | INTRAVENOUS | Status: AC
  Administered 2011-11-25: 13600 ug via INTRAVENOUS
  Filled 2011-11-25: qty 19

## 2011-11-25 MED ORDER — INSULIN ASPART 100 UNIT/ML ~~LOC~~ SOLN
0.0000 [IU] | Freq: Three times a day (TID) | SUBCUTANEOUS | Status: DC
  Administered 2011-11-25: 2 [IU] via SUBCUTANEOUS
  Administered 2011-11-26: 5 [IU] via SUBCUTANEOUS
  Administered 2011-11-26: 1 [IU] via SUBCUTANEOUS
  Filled 2011-11-25: qty 3

## 2011-11-25 MED ORDER — FENTANYL BOLUS VIA INFUSION
50.0000 ug | Freq: Four times a day (QID) | INTRAVENOUS | Status: DC | PRN
  Filled 2011-11-25: qty 100

## 2011-11-25 MED ORDER — HEPARIN SOD (PORCINE) IN D5W 100 UNIT/ML IV SOLN
INTRAVENOUS | Status: AC
  Administered 2011-11-25: 25000 [IU]
  Filled 2011-11-25: qty 250

## 2011-11-25 MED ORDER — EPTIFIBATIDE 75 MG/100ML IV SOLN
2.0000 ug/kg/min | INTRAVENOUS | Status: DC
  Administered 2011-11-25 – 2011-11-26 (×4): 2 ug/kg/min via INTRAVENOUS
  Filled 2011-11-25 (×12): qty 100

## 2011-11-25 MED ORDER — NITROGLYCERIN 0.4 MG SL SUBL: 0.4000 mg | SUBLINGUAL_TABLET | SUBLINGUAL | Status: DC | PRN

## 2011-11-25 MED ORDER — SODIUM CHLORIDE 0.9 % IV SOLN
50.0000 ug/h | INTRAVENOUS | Status: DC
  Filled 2011-11-25: qty 50

## 2011-11-25 MED ORDER — HYDROCORTISONE SOD SUCCINATE 250 MG IJ SOLR
200.0000 mg | Freq: Every day | INTRAMUSCULAR | Status: DC
  Filled 2011-11-25: qty 200

## 2011-11-25 NOTE — Procedures (Deleted)
Intubation Procedure Note QUANA CHAMBERLAIN [id number] November 15, 1951  Procedure: Intubation Indications: Respiratory insufficiency  Procedure Details Consent: Risks of procedure as well as the alternatives and risks of each were explained to the (patient/caregiver).  Consent for procedure obtained. Time Out: Verified patient identification, verified procedure, site/side was marked, verified correct patient position, special equipment/implants available, medications/allergies/relevent history reviewed, required imaging and test results available.  Performed  Maximum sterile technique was used including cap, gloves, gown, hand hygiene and mask.  3    Evaluation Hemodynamic Status: Transient hypotension treated with fluid; O2 sats: stable throughout Patient's Current Condition: stable Complications: No apparent complications Patient did tolerate procedure well. Chest X-ray ordered to verify placement.  CXR: tube position acceptable.   Koren Bound 11/25/2011

## 2011-11-25 NOTE — Procedures (Deleted)
Arterial Catheter Insertion Procedure Note Denise Cuevas [id number] 01/03/1951  Procedure: Insertion of Arterial Catheter  Indications: Blood pressure monitoring  Procedure Details Consent: Unable to obtain consent because of emergent medical necessity. Time Out: Verified patient identification, verified procedure, site/side was marked, verified correct patient position, special equipment/implants available, medications/allergies/relevent history reviewed, required imaging and test results available.  Performed  Maximum sterile technique was used including antiseptics, cap, gloves, gown, hand hygiene, mask and sheet. Skin prep: Chlorhexidine; local anesthetic administered 20 gauge catheter was inserted into right radial artery using the Seldinger technique.  Evaluation Blood flow good; BP tracing good. Complications: No apparent complications.   Koren Bound 11/25/2011

## 2011-11-25 NOTE — Progress Notes (Signed)
ANTICOAGULATION CONSULT NOTE - Initial Consult  Pharmacy Consult for heparin + integrilin Indication: unstable angina  Allergies  Allergen Reactions  . Byetta Nausea And Vomiting  . Crestor (Rosuvastatin Calcium) Nausea And Vomiting    Patient Measurements: Height: 5\' 2"  (157.5 cm) Weight: 166 lb (75.297 kg) IBW/kg (Calculated) : 50.1   Vital Signs: Temp: 97.5 F (36.4 C) (12/17 0205) Temp src: Oral (12/17 0205) BP: 103/64 mmHg (12/17 0205) Pulse Rate: 69  (12/17 0205)  Labs:  Basename 11/25/11 0231 11/25/11 0216  HGB 13.9 14.1  HCT 41.0 40.2  PLT -- 109*  APTT -- 30  LABPROT -- 13.9  INR -- 1.05  HEPARINUNFRC -- --  CREATININE 0.80 0.75  CKTOTAL -- --  CKMB -- --  TROPONINI -- --   Estimated Creatinine Clearance: 72.8 ml/min (by C-G formula based on Cr of 0.8).  Medical History: Past Medical History  Diagnosis Date  . Diabetes mellitus   . Hypertension   . Hypothyroid   . Fibromyalgia   . Hypercholesteremia     Assessment: Pt with recent diagnostic cath admitted with continued CP. Noted with ST depression and mildly elevated troponins. Heparin started earlier at an appropriate dose by ED physician and now adding integrilin.    Goal of Therapy:  Heparin level 0.3-0.5   Plan:  Continue heparin gtt at 900units/hr Check heparin level and CBC at 0830 Daily heparin level and CBC Integrilin 159mcg/kg bolus followed by 82mcg/kg/min  Dayra Rapley, Drake Leach 11/25/2011,3:58 AM

## 2011-11-25 NOTE — Progress Notes (Signed)
CRITICAL VALUE ALERT  Critical value received: TROP 2.12/ CKMB 12.5 Date of notification: 11/25/2011 Time of notification: 11:25 Critical value read back:YES Nurse who received alert:Valarie Farace Sheliah Plane MD notified (1st page): M. SKAINS Time of first page:  11:20 MD notified (2nd page):  Time of second page:  Responding WU:[id number]  Time MD responded:11:25

## 2011-11-25 NOTE — Progress Notes (Signed)
ANTICOAGULATION CONSULT NOTE - Initial Consult  Pharmacy Consult for heparin + integrilin Indication: unstable angina  Allergies  Allergen Reactions  . Byetta Nausea And Vomiting  . Crestor (Rosuvastatin Calcium) Nausea And Vomiting    Patient Measurements: Height: 5\' 2"  (157.5 cm) Weight: 162 lb 0.6 oz (73.5 kg) IBW/kg (Calculated) : 50.1   Vital Signs: Temp: 98 F (36.7 C) (12/17 0800) Temp src: Oral (12/17 0800) BP: 95/55 mmHg (12/17 1000) Pulse Rate: 70  (12/17 1000)  Labs:  Basename 11/25/11 0904 11/25/11 0231 11/25/11 0216  HGB 13.1 13.9 --  HCT 37.6 41.0 40.2  PLT 115* -- 109*  APTT 88* -- 30  LABPROT 14.5 -- 13.9  INR 1.11 -- 1.05  HEPARINUNFRC 0.30 -- --  CREATININE 0.66 0.80 0.75  CKTOTAL 101 -- --  CKMB 12.5* -- --  TROPONINI 2.12* -- --   Estimated Creatinine Clearance: 72 ml/min (by C-G formula based on Cr of 0.66).  Medical History: Past Medical History  Diagnosis Date  . Diabetes mellitus   . Hypertension   . Hypothyroid   . Fibromyalgia   . Hypercholesteremia   . Angina   . Coronary artery disease   . GERD (gastroesophageal reflux disease)   . Headache   . Depression   . H/O hiatal hernia   . Asthma     Assessment: 60 yo female admitted for diagnostic cath last week, found to have multi-vessel CAD and in need of surgical intervention.  She subsequently had episode of chest pain and returned to the hospital and was put on Heparin and Integrilin.  Today her heparin level is 0.3 and within the goal range.  No bleeding complications with therapy noted.    Goal of Therapy:  Heparin level 0.3-0.5   Plan:  Continue heparin gtt at 900units/hr Daily heparin level and CBC Integrilin 194mcg/kg bolus followed by 64mcg/kg/min  Nadara Mustard, PharmD., MS 11/25/2011,11:45 AM

## 2011-11-25 NOTE — H&P (Signed)
Admit date: 11/25/2011 Name:  Denise Cuevas Medical record number: [id number] DOB/Age:  1951/06/01  60 y.o.  Referring Physician:  Dr. Norlene Campbell ER Primary Cardiologist: Dr. Donato Schultz Chief complaint/reason for admission:  Chest pain  HPI:  25 WF with DM had cath last week with severe 2VD and was to see cardiac surgeon this Weds.  Worsening chest pain since then and severe pain tonight associated with N&V and came to ED.  1 mm lateral ST depression and mild elevated trop i.  Admitted for treatment of unstable angina.    Past Medical History  Diagnosis Date  . Diabetes mellitus   . Hypertension   . Hypothyroid   . Fibromyalgia   . Hypercholesteremia       History reviewed. No pertinent past surgical history..   Allergies: is allergic to byetta and crestor.    Medications:   No current facility-administered medications on file prior to encounter.   Current Outpatient Prescriptions on File Prior to Encounter  Medication Sig Dispense Refill  . aspirin EC 325 MG tablet Take 325 mg by mouth daily.        Marland Kitchen atenolol (TENORMIN) 25 MG tablet Take 25 mg by mouth daily.        . budesonide-formoterol (SYMBICORT) 160-4.5 MCG/ACT inhaler Inhale 2 puffs into the lungs 2 (two) times daily as needed. For shortness of breath       . DULoxetine (CYMBALTA) 30 MG capsule Take 90 mg by mouth daily.        . ergocalciferol (VITAMIN D2) 50000 UNITS capsule Take 50,000 Units by mouth once a week. Friday       . insulin glargine (LANTUS) 100 UNIT/ML injection Inject 24 Units into the skin daily.       . metFORMIN (GLUCOPHAGE-XR) 500 MG 24 hr tablet Take 1,500 mg by mouth daily.        Marland Kitchen omeprazole (PRILOSEC) 40 MG capsule Take 40 mg by mouth daily.        . pravastatin (PRAVACHOL) 20 MG tablet Take 20 mg by mouth daily.        . celecoxib (CELEBREX) 100 MG capsule Take 100 mg by mouth daily as needed. For pain or inflammation        Family history:  indicated that her mother is deceased.  She indicated that her father is deceased. She indicated that only one of her three sisters is alive.   Social History:   reports that she has never smoked. She has never used smokeless tobacco. She reports that she does not drink alcohol or use illicit drugs.   Review of Systems:  Other than as noted above, the remainder of the review of systems is normal  Physical Exam: BP 103/64  Pulse 69  Temp(Src) 97.5 F (36.4 C) (Oral)  Resp 20  Ht 5\' 2"  (1.575 m)  Wt 75.297 kg (166 lb)  BMI 30.36 kg/m2  SpO2 96%  General appearance: alert, cooperative, appears stated age and no distress Head: Normocephalic, without obvious abnormality, atraumatic Eyes: EOMI, PERRLA, C&S clear Neck: no JVD and supple, symmetrical, trachea midline, bilateral bruits Lungs: clear to auscultation bilaterally Heart: S1, S2 normal and no S3 or S4 2/6 harsh systolic murmur Abdomen: soft, non-tender; bowel sounds normal; no masses,  no organomegaly Pulses: 2+ and symmetric Neurologic: Grossly normal  Results for orders placed during the hospital encounter of 11/25/11 (from the past 24 hour(s))  BASIC METABOLIC PANEL     Status: Abnormal   Collection  Time   11/25/11  2:16 AM      Component Value Range   Sodium 136  135 - 145 (mEq/L)   Potassium 4.2  3.5 - 5.1 (mEq/L)   Chloride 100  96 - 112 (mEq/L)   CO2 29  19 - 32 (mEq/L)   Glucose, Bld 237 (*) 70 - 99 (mg/dL)   BUN 18  6 - 23 (mg/dL)   Creatinine, Ser 4.09  0.50 - 1.10 (mg/dL)   Calcium 9.6  8.4 - 81.1 (mg/dL)   GFR calc non Af Amer >90  >90 (mL/min)   GFR calc Af Amer >90  >90 (mL/min)  CBC     Status: Abnormal   Collection Time   11/25/11  2:16 AM      Component Value Range   WBC 7.1  4.0 - 10.5 (K/uL)   RBC 4.55  3.87 - 5.11 (MIL/uL)   Hemoglobin 14.1  12.0 - 15.0 (g/dL)   HCT 91.4  78.2 - 95.6 (%)   MCV 88.4  78.0 - 100.0 (fL)   MCH 31.0  26.0 - 34.0 (pg)   MCHC 35.1  30.0 - 36.0 (g/dL)   RDW 21.3  08.6 - 57.8 (%)   Platelets 109 (*) 150  - 400 (K/uL)  DIFFERENTIAL     Status: Normal   Collection Time   11/25/11  2:16 AM      Component Value Range   Neutrophils Relative 68  43 - 77 (%)   Neutro Abs 4.9  1.7 - 7.7 (K/uL)   Lymphocytes Relative 22  12 - 46 (%)   Lymphs Abs 1.5  0.7 - 4.0 (K/uL)   Monocytes Relative 8  3 - 12 (%)   Monocytes Absolute 0.6  0.1 - 1.0 (K/uL)   Eosinophils Relative 2  0 - 5 (%)   Eosinophils Absolute 0.2  0.0 - 0.7 (K/uL)   Basophils Relative 0  0 - 1 (%)   Basophils Absolute 0.0  0.0 - 0.1 (K/uL)  APTT     Status: Normal   Collection Time   11/25/11  2:16 AM      Component Value Range   aPTT 30  24 - 37 (seconds)  PROTIME-INR     Status: Normal   Collection Time   11/25/11  2:16 AM      Component Value Range   Prothrombin Time 13.9  11.6 - 15.2 (seconds)   INR 1.05  0.00 - 1.49   POCT I-STAT TROPONIN I     Status: Abnormal   Collection Time   11/25/11  2:30 AM      Component Value Range   Troponin i, poc 0.17 (*) 0.00 - 0.08 (ng/mL)   Comment NOTIFIED PHYSICIAN     Comment 3           POCT I-STAT, CHEM 8     Status: Abnormal   Collection Time   11/25/11  2:31 AM      Component Value Range   Sodium 141  135 - 145 (mEq/L)   Potassium 4.1  3.5 - 5.1 (mEq/L)   Chloride 101  96 - 112 (mEq/L)   BUN 18  6 - 23 (mg/dL)   Creatinine, Ser 4.69  0.50 - 1.10 (mg/dL)   Glucose, Bld 629 (*) 70 - 99 (mg/dL)   Calcium, Ion 5.28  4.13 - 1.32 (mmol/L)   TCO2 28  0 - 100 (mmol/L)   Hemoglobin 13.9  12.0 - 15.0 (g/dL)   HCT  41.0  36.0 - 46.0 (%)   EKG: 1 mm ST depression  In lateral leads Radiology: Upper limits normal heart    IMPRESSIONS:  1. Unstable angina 2. Severe 2 vessel CAD 3. Diabetes mellitus 4. Fibromyalgia 5. Mild thrombocytopenia 6. Murmur 7.  Bilateral bruits  PLAN:  Stepdown, IV NTG, begin Integrilin.  Check enzymes.  Cardia surgery to see.   Signed: Darden Palmer. MD [facility] 11/25/2011, 3:21 AM

## 2011-11-25 NOTE — ED Notes (Signed)
Pt reports having CP all the time, but tonight, pt had 2 episodes of CP that were worse than usual, had nausea and vomiting, diaphoresis and SOB.  Pt reports that both episodes happened around 11 and 1130 tonight.  States that she is not hurting bad at this time-is hurting per her norm.  Pt also denies nausea.  Pt noted to have a heart murmur.  Skin warm, dry and intact at this time.  Neuro intact.

## 2011-11-25 NOTE — Progress Notes (Deleted)
Patient intubated successfully and TLC placed. The saturation has been very poor prior and after. However, when I attempted to increase PEEP SBP dropped dramatically. Will need additional volume and D/C of lasix inorder to be able to recruit and increase oxygenation.  At this point, it is also difficult to ascertain if patient has a PNA, will start vanc/zosyn/aztreonam and pan culture.  Prognosis is very poor, the patient is not responding to mechanical ventilation and decompensating very rapidly.

## 2011-11-25 NOTE — Progress Notes (Deleted)
Spoke with the family, informed them that the patient is responding poorly in the sense of BP and sats. Told them that we will continue to do our best to maintain her but be aware that these are bad signs in patients with pulmonary HTN.   Total CC time of the day of 90 mmHg.

## 2011-11-25 NOTE — ED Provider Notes (Signed)
History     CSN: [id number] Arrival date & time: 11/25/2011  2:00 AM   First MD Initiated Contact with Patient 11/25/11 0206      Chief Complaint  Patient presents with  . Chest Pain    (Consider location/radiation/quality/duration/timing/severity/associated sxs/prior treatment) HPI Comments: Patient is a 60 year old female with a history of severe coronary artery disease as diagnosed by cardiac catheterization last week. She states that she has been having intermittent chest pains over the last 5 weeks which are exertional, heavy pressure and crushing in sensation, radiating to the bilateral shoulders and jaws. This is intermittent, worse with exertion and usually improves with nitroglycerin and rest. Tonight she developed a severe 15 out of 10 crushing pain which was in a similar location the mid chest, radiation to the right shoulder and jaw. This did not improve with nitroglycerin. Paramedics gave the patient 364 mg of aspirin, nitroglycerin and the patient's pain has now subsided other than a mild residual jaw pain. She had associated shortness of breath, diaphoresis and nausea. Symptoms were severe. Currently they are mild.  After having her heart catheterization findings included a diffuse severe or stenosis of the LAD, and significant obstruction of the obtuse marginal. She was scheduled to see cardiothoracic surgery as an outpatient coming in the next week.  Cards:  [facility]: [email or web address]    Patient is a 60 y.o. female presenting with chest pain. The history is provided by the patient, medical records, the EMS personnel and a relative.  Chest Pain     Past Medical History  Diagnosis Date  . Diabetes mellitus   . Hypertension   . Hypothyroid   . Fibromyalgia   . Hypercholesteremia     History reviewed. No pertinent past surgical history.  History reviewed. No pertinent family history.  History  Substance Use Topics  . Smoking status: Not on file  . Smokeless  tobacco: Not on file  . Alcohol Use:     OB History    Grav Para Term Preterm Abortions TAB SAB Ect Mult Living                  Review of Systems  Cardiovascular: Positive for chest pain.  All other systems reviewed and are negative.    Allergies  Byetta and Crestor  Home Medications   Current Outpatient Rx  Name Route Sig Dispense Refill  . ASPIRIN EC 325 MG PO TBEC Oral Take 325 mg by mouth daily.      . ATENOLOL 25 MG PO TABS Oral Take 25 mg by mouth daily.      . BUDESONIDE-FORMOTEROL FUMARATE 160-4.5 MCG/ACT IN AERO Inhalation Inhale 2 puffs into the lungs 2 (two) times daily as needed. For shortness of breath     . DULOXETINE HCL 30 MG PO CPEP Oral Take 90 mg by mouth daily.      . ERGOCALCIFEROL 50000 UNITS PO CAPS Oral Take 50,000 Units by mouth once a week. Friday     . INSULIN GLARGINE 100 UNIT/ML [place] SOLN Subcutaneous Inject 24 Units into the skin daily.     Marland Kitchen METFORMIN HCL ER 500 MG PO TB24 Oral Take 1,500 mg by mouth daily.      Marland Kitchen OMEPRAZOLE 40 MG PO CPDR Oral Take 40 mg by mouth daily.      Marland Kitchen PRAVASTATIN SODIUM 20 MG PO TABS Oral Take 20 mg by mouth daily.      . CELECOXIB 100 MG PO CAPS Oral Take 100 mg by  mouth daily as needed. For pain or inflammation       BP 103/64  Pulse 69  Temp(Src) 97.5 F (36.4 C) (Oral)  Resp 20  Ht 5\' 2"  (1.575 m)  Wt 166 lb (75.297 kg)  BMI 30.36 kg/m2  SpO2 96%  Physical Exam  Nursing note and vitals reviewed. Constitutional: She appears well-developed and well-nourished. No distress.  HENT:  Head: Normocephalic and atraumatic.  Mouth/Throat: Oropharynx is clear and moist. No oropharyngeal exudate.  Eyes: Conjunctivae and EOM are normal. Pupils are equal, round, and reactive to light. Right eye exhibits no discharge. Left eye exhibits no discharge. No scleral icterus.  Neck: Normal range of motion. Neck supple. No JVD present. No thyromegaly present.  Cardiovascular: Normal rate, regular rhythm and intact distal pulses.   Exam reveals no gallop and no friction rub.   Murmur ( 2/6 systolic) heard. Pulmonary/Chest: Effort normal and breath sounds normal. No respiratory distress. She has no wheezes. She has no rales.  Abdominal: Soft. Bowel sounds are normal. She exhibits no distension and no mass. There is no tenderness.  Musculoskeletal: Normal range of motion. She exhibits no edema and no tenderness.  Lymphadenopathy:    She has no cervical adenopathy.  Neurological: She is alert. Coordination normal.  Skin: Skin is warm and dry. No rash noted. No erythema.  Psychiatric: She has a normal mood and affect. Her behavior is normal.    ED Course  Procedures (including critical care time)  Labs Reviewed  BASIC METABOLIC PANEL - Abnormal; Notable for the following:    Glucose, Bld 237 (*)    All other components within normal limits  POCT I-STAT, CHEM 8 - Abnormal; Notable for the following:    Glucose, Bld 230 (*)    All other components within normal limits  POCT I-STAT TROPONIN I - Abnormal; Notable for the following:    Troponin i, poc 0.17 (*)    All other components within normal limits  CBC  DIFFERENTIAL  APTT  PROTIME-INR  I-STAT TROPONIN I  I-STAT, CHEM 8   No results found.   1. Non-STEMI (non-ST elevated myocardial infarction)       MDM  Currently patient has a normal blood pressure and heart rate, loud heart murmur which she states she was diagnosed with last week. Has an EKG which shows normal sinus rhythm without ischemia. This is similar to the EKG from January of 2009. Will discuss with cardiology and anticipate admission to the hospital.  Heparin ordered for anticoagulation   ED ECG REPORT   Date: 11/25/2011   Rate: 67  Rhythm: normal sinus rhythm  QRS Axis: normal  Intervals: normal  ST/T Wave abnormalities: ST depressions in the lateral leads and V4 V5  Conduction Disutrbances:none  Narrative Interpretation:   Old EKG Reviewed: ST depressions now present, minimal, no  ST elevation compared with 01/02/1999 and  Discussed with cardiologist who will come to see the patient.  Dr. Donnie Aho.  Laboratory workup reviewed and reveals a positive troponin at 0.17  Otherwise laboratory workup unremarkable   CRITICAL CARE Performed by: Vida Roller   Total critical care time: 30  Critical care time was exclusive of separately billable procedures and treating other patients.  Critical care was necessary to treat or prevent imminent or life-threatening deterioration.  Critical care was time spent personally by me on the following activities: development of treatment plan with patient and/or surrogate as well as nursing, discussions with consultants, evaluation of patient's response to treatment, examination  of patient, obtaining history from patient or surrogate, ordering and performing treatments and interventions, ordering and review of laboratory studies, ordering and review of radiographic studies, pulse oximetry and re-evaluation of patient's condition.    Vida Roller, MD 11/25/11 [phone]

## 2011-11-25 NOTE — ED Notes (Signed)
Pt from home.  Started to have CP 10/10, diaphoresis, SOB, vomiting.  Pt given 324 asa, 1 SL nitro, 2mg  morphine.  CP now a 2/10.  12-lead unremarkable at this time.  103/58, 62  NSR.  20ga R-hand.  CBG 216.

## 2011-11-25 NOTE — Progress Notes (Signed)
Subjective:  60 year old with severe 2 v CAD (OM and LAD) normal EF, DM. Here with AMI, NSTEMI, [place]. CP free currently. No SOB. Comfortable. No c/o.   Objective:  Vital Signs in the last 24 hours: Temp:  [97.5 F (36.4 C)-98.4 F (36.9 C)] 98.4 F (36.9 C) (12/17 0545) Pulse Rate:  [65-76] 71  (12/17 0600) Resp:  [13-20] 15  (12/17 0600) BP: (98-105)/(51-64) 99/51 mmHg (12/17 0600) SpO2:  [96 %-99 %] 99 % (12/17 0600) Weight:  [73.5 kg (162 lb 0.6 oz)-75.297 kg (166 lb)] 162 lb 0.6 oz (73.5 kg) (12/17 0545)  Intake/Output from previous day: 12/16 0701 - 12/17 0700 In: 156.9 [P.O.:60; I.V.:96.9] Out: -    Physical Exam: General: Well developed, well nourished, in no acute distress. Head:  Normocephalic and atraumatic. Lungs: Clear to auscultation and percussion. Heart: Normal S1 and S2.  No murmur, rubs or gallops.  Pulses: Pulses normal in all 4 extremities. Extremities: No clubbing or cyanosis. No edema. Right radial site normal.  Neurologic: Alert and oriented x 3.    Lab Results:  Basename 11/25/11 0231 11/25/11 0216  WBC -- 7.1  HGB 13.9 14.1  PLT -- 109*    Basename 11/25/11 0231 11/25/11 0216  NA 141 136  K 4.1 4.2  CL 101 100  CO2 -- 29  GLUCOSE 230* 237*  BUN 18 18  CREATININE 0.80 0.75    Imaging: Dg Chest Port 1 View  11/25/2011  *RADIOLOGY REPORT*  Clinical Data: Chest pain, shortness of breath.  PORTABLE CHEST - 1 VIEW  Comparison: 01/03/2008  Findings: Heart size upper normal limits to mildly enlarged.  Mild central vascular congestion.  Interstitial prominence may be exaggerated by poor inspiratory effort.  No definite focal areas of consolidation.  No pleural effusion or pneumothorax.  No acute osseous abnormality.  IMPRESSION: Heart size upper normal limits with central vascular congestion. No overt edema.  Interstitial crowding is likely exaggerated by technique/respiratory effort.  Original Report Authenticated By: Waneta Martins, M.D.     EKG:  ECG this am, NSR 68, no ST changes. Yesterday, ST depression in anterior leads. PAC.   Cardiac Studies:  Cath reviewed. See report. Normal EF. LAD/OM dz.   Assessment/Plan:   NSTEMI - ECG earlier with ischemic changes. Progressive angina. Now CP free on NTG gtt, Integrillin gtt, asa, atenolol 25 QD, simvastatin 10. Trop 0.17 mildly elevated.   Spoke with Dr. Dorris Fetch who was to meet with her on Wed to discuss CABG. He will discuss with Dee at [facility] to make sure that she is seen today. No plavix due to upcoming CABG.  CAD - LAD/OM  HTN - stable. Low side 90's. NTG   DM - stable.   Critical care time: 35 min spent with patient, on phone with other physician, review of medical records, cath, labs.     Denise Cuevas 11/25/2011, 8:41 AM

## 2011-11-25 NOTE — Procedures (Deleted)
Central Venous Catheter Insertion Procedure Note RHYLIE STEHR [id number] 1951-12-01  Procedure: Insertion of Central Venous Catheter Indications: Assessment of intravascular volume and Drug and/or fluid administration  Procedure Details Consent: Risks of procedure as well as the alternatives and risks of each were explained to the (patient/caregiver).  Consent for procedure obtained. Time Out: Verified patient identification, verified procedure, site/side was marked, verified correct patient position, special equipment/implants available, medications/allergies/relevent history reviewed, required imaging and test results available.  Performed  Maximum sterile technique was used including antiseptics, cap, gloves, gown, hand hygiene, mask and sheet. Skin prep: Chlorhexidine; local anesthetic administered A antimicrobial bonded/coated triple lumen catheter was placed in the left internal jugular vein using the Seldinger technique.  Evaluation Blood flow good Complications: No apparent complications Patient did tolerate procedure well. Chest X-ray ordered to verify placement.  CXR: normal.  YACOUB,WESAM 11/25/2011, 7:06 PM

## 2011-11-25 NOTE — Consult Note (Signed)
[address]            [place] [address]          [phone]       Denise Cuevas [facility] Medical Record #[id number] Date of Birth: 02/05/1951  Referring: No ref. provider found Primary Care: Candi Leash, MD  Chief Complaint:    Chief Complaint  Patient presents with  . Chest Pain    History of Present Illness:     60 year old female with 6 week history of episodic chest discomfort with diaphoresis nausea and radiation into both arms. She notes that she first noted the episodes while driving to [place] 6 weeks ago since that time they've become increasingly frequent and severe. She underwent echocardiogram and cardiac catheterization last week and was being referred to the cardiac surgery office this week. The patient had an episode of prolonged chest pain last night that has since resolved came to the emergency room with positive enzymes. In-hospital cardiac surgery consult has been requested for consideration of coronary artery bypass grafting. The patient has no previous history of known cardiac disease. She has had diabetes for several years. She's been told in the past that she has thyroid disease with hypothyroidism but quit taking her thyroid and diabetes medicines several months ago.  Patient is a nonsmoker.  She is currently in the coronary care unit on heparin and Integrilin infusion and has had no chest pain since last night when she was admitted.   Current Activity/ Functional Status: Patient is independent with mobility/ambulation, transfers, ADL's, IADL's.   Past Medical History  Diagnosis Date  . Hypertension   . Hypothyroid   . Fibromyalgia   . Hypercholesteremia   . Angina   . Coronary artery disease   . GERD (gastroesophageal reflux disease)   . Headache   . Depression   . H/O hiatal hernia   . Asthma   . Diabetes mellitus     Past Surgical History  Procedure Date  . Cholecystectomy     History    Smoking status  . Never Smoker   Smokeless tobacco  . Never Used    History  Alcohol Use No    History   Social History  . Marital Status: Married    Spouse Name: N/A    Number of Children: N/A  . Years of Education: N/A   Occupational History  . Not on file.   Social History Main Topics  . Smoking status: Never Smoker   . Smokeless tobacco: Never Used  . Alcohol Use: No  . Drug Use: No  . Sexually Active: Yes   Other Topics Concern  . Not on file   Social History Narrative   Retired Consulting civil engineer    Allergies  Allergen Reactions  . Byetta Nausea And Vomiting  . Crestor (Rosuvastatin Calcium) Nausea And Vomiting    Current Facility-Administered Medications  Medication Dose Route Frequency Provider Last Rate Last Dose  . 0.9 %  sodium chloride infusion   Intravenous Once Vida Roller, MD 75 mL/hr at 11/25/11 0250    . 0.9 %  sodium chloride infusion  250 mL Intravenous PRN W Ashley Royalty., MD      . acetaminophen (TYLENOL) tablet 650 mg  650 mg Oral Q4H PRN W Ashley Royalty., MD      . aspirin EC tablet 81 mg  81 mg Oral Daily Delight Ovens, MD      . atenolol (TENORMIN) tablet 25 mg  25 mg Oral Daily W Ashley Royalty., MD      . budesonide-formoterol [facility]) 160-4.5 MCG/ACT inhaler 2 puff  2 puff Inhalation BID PRN W Ashley Royalty., MD      . DULoxetine (CYMBALTA) DR capsule 90 mg  90 mg Oral Daily W Ashley Royalty., MD   90 mg at 11/25/11 1034  . eptifibatide (INTEGRILIN) 0.75 mg/mL bolus via infusion 13,600 mcg  180 mcg/kg Intravenous Once [employer] Rumbarger, PHARMD   13,600 mcg at 11/25/11 0424  . eptifibatide (INTEGRILIN) 75 mg / 100 mL infusion  2 mcg/kg/min Intravenous Continuous Drake Leach Rumbarger, PHARMD 12 mL/hr at 11/25/11 1030 2 mcg/kg/min at 11/25/11 1030  . heparin 100 UNIT/ML infusion        25,000 Units at 11/25/11 0248  . heparin ADULT infusion 100 units/mL (25000 units/250 mL)  12 Units/kg/hr Intravenous  Continuous Vida Roller, MD 9 mL/hr at 11/25/11 0700 900 Units at 11/25/11 0700  . heparin bolus via infusion 4,000 Units  4,000 Units Intravenous Once Vida Roller, MD   4,000 Units at 11/25/11 0249  . insulin aspart (novoLOG) injection 0-9 Units  0-9 Units Subcutaneous TID WC W Ashley Royalty., MD      . insulin glargine (LANTUS) injection 24 Units  24 Units Subcutaneous Daily W Ashley Royalty., MD   24 Units at 11/25/11 1035  . levothyroxine (SYNTHROID, LEVOTHROID) tablet 125 mcg  125 mcg Oral QAC breakfast Donato Schultz, MD      . metFORMIN (GLUCOPHAGE-XR) 24 hr tablet 1,500 mg  1,500 mg Oral Q breakfast W Ashley Royalty., MD      . nitroGLYCERIN (NITROSTAT) SL tablet 0.4 mg  0.4 mg Sublingual Q5 min PRN W Ashley Royalty., MD      . nitroGLYCERIN 0.2 mg/mL in dextrose 5 % infusion  3-30 mcg/min Intravenous Titrated W Ashley Royalty., MD 1.5 mL/hr at 11/25/11 0700 5 mcg/min at 11/25/11 0700  . nitroGLYCERIN 0.2 mg/mL in dextrose 5 % infusion        50,000 mcg at 11/25/11 0636  . pantoprazole (PROTONIX) EC tablet 40 mg  40 mg Oral Q1200 W Ashley Royalty., MD      . simvastatin [facility]) tablet 10 mg  10 mg Oral q1800 Darden Palmer., MD      . sodium chloride 0.9 % injection 3 mL  3 mL Intravenous Q12H W Ashley Royalty., MD      . sodium chloride 0.9 % injection 3 mL  3 mL Intravenous PRN W Ashley Royalty., MD      . Vitamin D (Ergocalciferol) (DRISDOL) capsule 50,000 Units  50,000 Units Oral Weekly W Ashley Royalty., MD      . DISCONTD: aspirin EC tablet 325 mg  325 mg Oral Daily W Ashley Royalty., MD   325 mg at 11/25/11 4098  . DISCONTD: aspirin EC tablet 81 mg  81 mg Oral Daily W Ashley Royalty., MD        Prescriptions prior to admission  Medication Sig Dispense Refill  . aspirin EC 325 MG tablet Take 325 mg by mouth daily.        Marland Kitchen atenolol (TENORMIN) 25 MG tablet Take 25 mg by mouth daily.        . budesonide-formoterol (SYMBICORT) 160-4.5 MCG/ACT  inhaler Inhale 2 puffs into  the lungs 2 (two) times daily as needed. For shortness of breath       . DULoxetine (CYMBALTA) 30 MG capsule Take 90 mg by mouth daily.        . ergocalciferol (VITAMIN D2) 50000 UNITS capsule Take 50,000 Units by mouth once a week. Friday       . insulin glargine (LANTUS) 100 UNIT/ML injection Inject 24 Units into the skin daily.       Marland Kitchen levothyroxine (SYNTHROID, LEVOTHROID) 125 MCG tablet Take 125 mcg by mouth daily before breakfast.        . metFORMIN (GLUCOPHAGE-XR) 500 MG 24 hr tablet Take 1,500 mg by mouth daily.        Marland Kitchen omeprazole (PRILOSEC) 40 MG capsule Take 40 mg by mouth daily.        . pravastatin (PRAVACHOL) 20 MG tablet Take 20 mg by mouth daily.        . celecoxib (CELEBREX) 100 MG capsule Take 100 mg by mouth daily as needed. For pain or inflammation         Family History  Problem Relation Age of Onset  . Heart disease Father 32    died of MI  . Peripheral vascular disease Mother 29    bilaterial amputations     Review of Systems:     Cardiac Review of Systems: Y or N  Chest Pain [  y  ]  Resting SOB [ y  ] Exertional SOB  [ y ]  Orthopnea [ n ]   Pedal Edema [n   ]    Palpitations [n  ] Syncope  [n ]   Presyncope [ n  ]  General Review of Systems: [Y] = yes [  ]=no Consitutional: recent weight change [place]ienne  ]; anorexia [ y ]; fatigue [  ]; nausea [  y]; night sweats [  ]; fever [  ]; or chills [  ];                                                                                                                                          Dental: poor dentition[n ];   Eye : blurred vision [n  ]; diplopia [  n ]; vision changes [  n];  Amaurosis fugax[n  ]; Resp: cough [n ];  wheezing[n  ];  hemoptysis[n  ]; shortness of breath[ n ]; paroxysmal nocturnal dyspnea[  ]; dyspnea on exertion[ y ]; or orthopnea[  ];  GI:  gallstones[y  ], vomiting[ y ];  dysphagia[ n ]; melena[ n ];  hematochezia [ n ]; heartburn[ n ];   Hx of  Colonoscopy[ ? ];                GU: kidney stones [ n ]; hematurian ];  peripheral edema[n  ];  or itching[  n]; Musculosketetal: myalgias[  y];  joint swelling[  n ];  joint erythema[ n ];  joint pain[ n];  back pain[ y ];  Heme/Lymph: bruising[ y ];  bleeding[n ];  anemia[ n ];  Neuro: TIA[ n];  headaches[  n];  stroke[  ];  vertigon  ];  seizures[ n ];   paresthesias[ n ];  difficulty walking[n  ];  Psych:depression[ y ]; anxiety[ n ];  Endocrine: diabetes[ y ];  thyroid dysfunction[y  ];  Immunizations: Flu [  ?]; Pneumococcal[  ]?;  Other:  Physical Exam: BP 95/55  Pulse 70  Temp(Src) 97.9 F (36.6 C) (Oral)  Resp 13  Ht 5\' 2"  (1.575 m)  Wt 162 lb 0.6 oz (73.5 kg)  BMI 29.64 kg/m2  SpO2 98%  General appearance: alert, cooperative, appears older than stated age and moderately obese Neurologic: intact, I do not appreciate carotid bruits no previous exam are noted a right carotid bruit Heart: regular rate and rhythm, S1, S2 normal, no murmur, click, rub or gallop and normal apical impulse. Lungs: clear to auscultation bilaterally Abdomen: soft, non-tender; bowel sounds normal; no masses,  no organomegaly Extremities: extremities normal, atraumatic, no cyanosis or edema, Homans sign is negative, no sign of DVT, no edema, redness or tenderness in the calves or thighs and no ulcers, gangrene or trophic changes Cath site right wrist is without hematoma   Diagnostic Studies & Laboratory data:     Recent Radiology Findings:   Dg Chest Port 1 View  11/25/2011  *RADIOLOGY REPORT*  Clinical Data: Chest pain, shortness of breath.  PORTABLE CHEST - 1 VIEW  Comparison: 01/03/2008  Findings: Heart size upper normal limits to mildly enlarged.  Mild central vascular congestion.  Interstitial prominence may be exaggerated by poor inspiratory effort.  No definite focal areas of consolidation.  No pleural effusion or pneumothorax.  No acute osseous abnormality.  IMPRESSION: Heart size upper normal limits with central  vascular congestion. No overt edema.  Interstitial crowding is likely exaggerated by technique/respiratory effort.  Original Report Authenticated By: Waneta Martins, M.D.      Recent Lab Findings: Lab Results  Component Value Date   WBC 6.7 11/25/2011   HGB 13.1 11/25/2011   HCT 37.6 11/25/2011   PLT 115* 11/25/2011   GLUCOSE 238* 11/25/2011   ALT 55* 11/25/2011   AST 66* 11/25/2011   NA 138 11/25/2011   K 4.2 11/25/2011   CL 102 11/25/2011   CREATININE 0.66 11/25/2011   BUN 17 11/25/2011   CO2 30 11/25/2011   INR 1.11 11/25/2011   Cath 12/11/20012:  PROCEDURE: Left heart catheterization with selective coronary angiography, left ventriculogram via the radial artery approach.  INDICATIONS: 61 year old diabetic with hypertension, hyperlipidemia, strong family history of CAD with her father dying in his early 55s with classic anginal symptoms, radiating and left arm across her chest wall up to jaw, nose, neck. Squeezing. Felt nausea at times as well as diaphoresis and this was brought about with exertion. Walking her dog for instance. Feels like a choking sensation. Usually lasts about 30 minutes duration at its worst and subsides with rest. This all started in early November. Her mother also had CAD, PVD with AA and PT. She has a lot of stress in her life at the child with autism. She also has carotid bruits which are currently being evaluated.  The risks, benefits, and details of the procedure were explained to the patient, including possibilities of stroke, heart attack, death, renal impairment, arterial damage, bleeding. The patient verbalized understanding and wanted  to proceed. Informed written consent was obtained.  PROCEDURE TECHNIQUE: Allen's test was performed pre-and post procedure and was normal. The right radial artery site was prepped and draped in a sterile fashion. One percent lidocaine was used for local anesthesia. Using the modified Seldinger technique a 5 French  hydrophilic sheath was inserted into the radial artery without difficulty. 3 mg of verapamil was administered via the sheath. A Tiger with the guidance of a Versicore wire was placed in the right coronary cusp and selectively cannulated the right coronary artery. After traversing the aortic arch, 3800 units of heparin IV was administered. A Judkins left #3.5 catheter was used to selectively cannulate the left main artery. In fact, she had dual ostia of LAD and circumflex. Multiple views with hand injection of Omnipaque were obtained. Catheter a pigtail catheter was used to cross into the left ventricle, hemodynamics were obtained, and a left ventriculogram was performed in the RAO position with power injection. Following the procedure, sheath was removed, patient was hemodynamically stable, hemostasis was maintained with a Terumo T band.  CONTRAST: Total of 105 ml.  FLOUROSCOPY TIME: 10 min.  COMPLICATIONS: None.  HEMODYNAMICS: Aortic pressure was 125/69 with a mean of 92 ; LV pressure was 126/14/20; LVEDP 20 mmHg. There was no gradient between the left ventricle and aorta.  ANGIOGRAPHIC DATA:  Left main: There truly was no left main. Dual ostia.  Left anterior descending (LAD): after the bifurcation of a large diagonal branch, there was significant and diffuse disease in the mid LAD segment likely over 40 mm of vessel and a focal lesion distally in the LAD as well of up to 70%. The mid LAD long segment at points is up to 80-90% significant stenosis. Nitroglycerin was administered and did not alter the confirmation of the vessel dramatically.  Circumflex artery (CIRC): It was difficult to selectively cannulate the ostia of the circumflex. She did however have a focal 90% proximal obtuse marginal lesion.  Right coronary artery (RCA): This is the dominant vessel giving rise to the PDA which is a small caliber vessel.There is no significant CAD present.  LEFT VENTRICULOGRAM: Left ventricular angiogram was  done in the 30 RAO projection and revealed normal left ventricular wall motion and systolic function with an estimated ejection fraction of 65 %. LVEDP was 20 mmHg.  IMPRESSIONS:  1. Severe coronary artery disease of the mid LAD just after the bifurcation of a large diagonal branch, long segment of stenosis of up to 80-90%. Focal distal LAD lesion of up to 70%. Focal obtuse marginal lesion of 90%. 2.Normal left ventricular systolic function. LVEDP 20 mmHg. Ejection fraction 65 %.  RECOMMENDATION:  I discussed the case with the patient as well as Dr. Verdis Prime. Given her diabetic status, extensively diseased mid LAD, I will refer to cardiothoracic surgery for discussion of bypass surgery. If percutaneous intervention were to take place, she would need a focal stent in the obtuse marginal and a long LAD stent which would jail the first diagonal branch. This could lead to repeat procedures in the future.   ECHO done in [facility]- report only to be scanned Moderate ca++ aortic valve without stenosis , peak velocity at aortic valve 1.88 m/s, trace mr , NL EF  Assessment / Plan:     Coronary occlusive disease with high-grade mid LAD of 80% and very distal 70% LAD lesion, proximal circumflex disease of 80% to 90%, luminal irregularities of the right coronary artery without high-grade stenosis. Normal ejection fraction  Subendocardial acute myocardial infarction last night Poorly controlled diabetes-patient reports recent hemoglobin A1c of over 9, current level is pending History of hyperthyroidism History of polymyalgia  I agree with Dr. Chales Abrahams  with the patient's diabetes in complex coronary artery disease involving the mid LAD and circumflex next coronary artery bypass grafting offers the best long-term treatment. She does have some aortic valve calcification but no evidence of gradient. I've recommended to her that we proceed with coronary artery bypass grafting. The goals risks and  alternatives of the planned surgical procedure coronary artery bypass  have been discussed with the patient in detail. The risks of the procedure including death, infection, stroke, myocardial infarction, bleeding, blood transfusion have all been discussed specifically.  I have quoted Denise Cuevas a 2 % of perioperative mortality and a complication rate as high as 25 %. The patient's questions have been answered.Denise Cuevas is willing  to proceed with the planned procedure.  Plan to proceed with coronary bypass grafting on Wednesday morning. We'll continue heparin up until that time and stop the Integrilin 6 hours preop.       Delight Ovens MD  Beeper [phone] Office [phone] 11/25/2011 1:00 PM

## 2011-11-25 NOTE — Plan of Care (Signed)
Problem: Consults Goal: Cardiac Surgery Patient Education ( See Patient Education module for education specifics.) Outcome: Progressing Video/book/ iss

## 2011-11-26 ENCOUNTER — Inpatient Hospital Stay (HOSPITAL_COMMUNITY): Payer: BC Managed Care – PPO

## 2011-11-26 DIAGNOSIS — Z01810 Encounter for preprocedural cardiovascular examination: Secondary | ICD-10-CM

## 2011-11-26 DIAGNOSIS — I2510 Atherosclerotic heart disease of native coronary artery without angina pectoris: Secondary | ICD-10-CM

## 2011-11-26 LAB — TYPE AND SCREEN
ABO/RH(D): A POS
Antibody Screen: NEGATIVE

## 2011-11-26 LAB — CBC
Hemoglobin: 12.9 g/dL (ref 12.0–15.0)
Hemoglobin: 12.9 g/dL (ref 12.0–15.0)
MCH: 30.7 pg (ref 26.0–34.0)
MCHC: 35 g/dL (ref 30.0–36.0)
RBC: 4.2 MIL/uL (ref 3.87–5.11)
WBC: 4.9 10*3/uL (ref 4.0–10.5)

## 2011-11-26 LAB — BASIC METABOLIC PANEL
BUN: 10 mg/dL (ref 6–23)
CO2: 30 mEq/L (ref 19–32)
Chloride: 104 mEq/L (ref 96–112)
Creatinine, Ser: 0.67 mg/dL (ref 0.50–1.10)
GFR calc Af Amer: >90 mL/min (ref >90)
Glucose, Bld: 129 mg/dL — ABNORMAL HIGH (ref 70–99)
Potassium: 4.4 mEq/L (ref 3.5–5.1)

## 2011-11-26 LAB — SURGICAL PCR SCREEN
MRSA, PCR: NEGATIVE
Staphylococcus aureus: NEGATIVE

## 2011-11-26 LAB — HEPARIN LEVEL (UNFRACTIONATED)
Heparin Unfractionated: 0.18 IU/mL — ABNORMAL LOW (ref 0.30–0.70)
Heparin Unfractionated: 0.26 IU/mL — ABNORMAL LOW (ref 0.30–0.70)

## 2011-11-26 LAB — ABO/RH: ABO/RH(D): A POS

## 2011-11-26 LAB — GLUCOSE, CAPILLARY
Glucose-Capillary: 137 mg/dL — ABNORMAL HIGH (ref 70–99)
Glucose-Capillary: 251 mg/dL — ABNORMAL HIGH (ref 70–99)
Glucose-Capillary: 116 mg/dL — ABNORMAL HIGH (ref 70–99)

## 2011-11-26 MED ORDER — CHLORHEXIDINE GLUCONATE 4 % EX LIQD
60.0000 mL | Freq: Once | CUTANEOUS | Status: AC
  Filled 2011-11-26: qty 60

## 2011-11-26 MED ORDER — SODIUM CHLORIDE 0.9 % IV SOLN
INTRAVENOUS | Status: DC
  Filled 2011-11-26: qty 40

## 2011-11-26 MED ORDER — HEPARIN (PORCINE) IN NACL 100-0.45 UNIT/ML-% IJ SOLN
1250.0000 [IU]/h | INTRAMUSCULAR | Status: DC
  Administered 2011-11-26: 1250 [IU]/h via INTRAVENOUS
  Filled 2011-11-26 (×2): qty 250

## 2011-11-26 MED ORDER — ALBUTEROL SULFATE (5 MG/ML) 0.5% IN NEBU
2.5000 mg | INHALATION_SOLUTION | Freq: Once | RESPIRATORY_TRACT | Status: AC
  Administered 2011-11-26: 2.5 mg via RESPIRATORY_TRACT

## 2011-11-26 MED ORDER — VANCOMYCIN HCL 1000 MG IV SOLR
1250.0000 mg | INTRAVENOUS | Status: AC
  Administered 2011-11-27: 1250 mg via INTRAVENOUS
  Filled 2011-11-26: qty 1250

## 2011-11-26 MED ORDER — ALPRAZOLAM 0.25 MG PO TABS: 0.2500 mg | ORAL_TABLET | ORAL | Status: DC | PRN

## 2011-11-26 MED ORDER — SODIUM CHLORIDE 0.9 % IV SOLN
INTRAVENOUS | Status: AC
  Administered 2011-11-27: 2.2 [IU]/h via INTRAVENOUS
  Filled 2011-11-26: qty 1

## 2011-11-26 MED ORDER — TEMAZEPAM 15 MG PO CAPS
15.0000 mg | ORAL_CAPSULE | Freq: Once | ORAL | Status: AC | PRN
  Administered 2011-11-26: 15 mg via ORAL
  Filled 2011-11-26: qty 1

## 2011-11-26 MED ORDER — NITROGLYCERIN IN D5W 200-5 MCG/ML-% IV SOLN
2.0000 ug/min | INTRAVENOUS | Status: AC
  Administered 2011-11-27: 5 ug/kg/min via INTRAVENOUS
  Filled 2011-11-26: qty 250

## 2011-11-26 MED ORDER — CHLORHEXIDINE GLUCONATE 4 % EX LIQD
60.0000 mL | Freq: Once | CUTANEOUS | Status: AC
  Administered 2011-11-26: 4 via TOPICAL

## 2011-11-26 MED ORDER — MAGNESIUM SULFATE 50 % IJ SOLN
40.0000 meq | INTRAMUSCULAR | Status: DC
  Filled 2011-11-26: qty 10

## 2011-11-26 MED ORDER — DEXTROSE 5 % IV SOLN
750.0000 mg | INTRAVENOUS | Status: DC
  Filled 2011-11-26 (×2): qty 750

## 2011-11-26 MED ORDER — PLASMA-LYTE 148 IV SOLN
INTRAVENOUS | Status: AC
  Administered 2011-11-27: 10:00:00
  Filled 2011-11-26: qty 0.5

## 2011-11-26 MED ORDER — DEXTROSE 5 % IV SOLN
1.5000 g | INTRAVENOUS | Status: AC
  Administered 2011-11-27: .75 g via INTRAVENOUS
  Administered 2011-11-27: 1.5 g via INTRAVENOUS
  Filled 2011-11-26: qty 1.5

## 2011-11-26 MED ORDER — PHENYLEPHRINE HCL 10 MG/ML IJ SOLN
30.0000 ug/min | INTRAVENOUS | Status: AC
  Administered 2011-11-27 (×2): 20 ug/min via INTRAVENOUS
  Filled 2011-11-26: qty 2

## 2011-11-26 MED ORDER — METOPROLOL TARTRATE 12.5 MG HALF TABLET
12.5000 mg | ORAL_TABLET | Freq: Once | ORAL | Status: AC
  Administered 2011-11-27: 12.5 mg via ORAL
  Filled 2011-11-26 (×2): qty 1

## 2011-11-26 MED ORDER — EPINEPHRINE HCL 1 MG/ML IJ SOLN
0.5000 ug/min | INTRAVENOUS | Status: DC
  Filled 2011-11-26: qty 4

## 2011-11-26 MED ORDER — SODIUM CHLORIDE 0.9 % IV SOLN
0.1000 ug/kg/h | INTRAVENOUS | Status: AC
  Administered 2011-11-27: .2 ug/kg/h via INTRAVENOUS
  Filled 2011-11-26: qty 4

## 2011-11-26 MED ORDER — POTASSIUM CHLORIDE 2 MEQ/ML IV SOLN
80.0000 meq | INTRAVENOUS | Status: DC
  Filled 2011-11-26: qty 40

## 2011-11-26 MED ORDER — DOPAMINE-DEXTROSE 3.2-5 MG/ML-% IV SOLN
2.0000 ug/kg/min | INTRAVENOUS | Status: DC
  Filled 2011-11-26: qty 250

## 2011-11-26 MED ORDER — CHLORHEXIDINE GLUCONATE 4 % EX LIQD
60.0000 mL | Freq: Once | CUTANEOUS | Status: AC
  Administered 2011-11-27: 4 via TOPICAL

## 2011-11-26 MED ORDER — WHITE PETROLATUM GEL
Status: AC
  Administered 2011-11-26: 20:00:00
  Filled 2011-11-26: qty 5

## 2011-11-26 MED ORDER — CHLORHEXIDINE GLUCONATE 4 % EX LIQD
CUTANEOUS | Status: AC
  Administered 2011-11-26: 4 via TOPICAL
  Filled 2011-11-26: qty 60

## 2011-11-26 NOTE — Progress Notes (Signed)
ANTICOAGULATION CONSULT NOTE - Follow Up Consult  Pharmacy Consult for heparin/integrilin Indication: chest pain/ACS  Allergies  Allergen Reactions  . Byetta Nausea And Vomiting  . Crestor (Rosuvastatin Calcium) Nausea And Vomiting    Patient Measurements: Height: 5\' 2"  (157.5 cm) Weight: 166 lb 10.7 oz (75.6 kg) IBW/kg (Calculated) : 50.1  Adjusted Body Weight: 66   Vital Signs: Temp: 98.2 F (36.8 C) (12/18 1100) Temp src: Oral (12/18 1100) BP: 118/45 mmHg (12/18 1400) Pulse Rate: 75  (12/18 1400)  Labs:  Basename 11/26/11 1332 11/26/11 0527 11/25/11 1809 11/25/11 1116 11/25/11 0904 11/25/11 0231 11/25/11 0216  HGB 12.9 12.9 -- -- -- -- --  HCT 36.9 37.3 -- -- 37.6 -- --  PLT 90* 87* -- -- 115* -- --  APTT -- -- -- -- 88* -- 30  LABPROT -- -- -- -- 14.5 -- 13.9  INR -- -- -- -- 1.11 -- 1.05  HEPARINUNFRC 0.26* 0.18* -- -- 0.30 -- --  CREATININE -- 0.67 -- -- 0.66 0.80 --  CKTOTAL -- -- 77 96 101 -- --  CKMB -- -- 9.6* 12.2* 12.5* -- --  TROPONINI -- -- 1.58* 1.72* 2.12* -- --   Estimated Creatinine Clearance: 73 ml/min (by C-G formula based on Cr of 0.67).   Assessment: 60 yof on heparin and integrilin for acs/awaiting OHS in am. Heparin level just below goal (0.26). Renal function stable for current integrilin dosing. Plt have been declining but appear to have stabilized. No overt bleeding noted.  Goal of Therapy:  Heparin level 0.3-0.5   Plan:  Increase IV heparin to 1250 units/hr Continue IV Integrilin at 97mcg/kg/min(stop 6h prior to OHS) Follow up after surgery tomorrow Severiano Gilbert 11/26/2011,3:22 PM

## 2011-11-26 NOTE — Plan of Care (Signed)
Problem: Phase I Progression Outcomes Goal: Verbalized level of anxiety/depression Outcome: Progressing Pt. Appears at ease, but verbalizes concerns about having grafts taken from legs d/t family history of PAD. Encouraged pt. To discuss this with surgeon when he rounds and explained that the doppler results will guide him in choosing appropriate site for harvest. Goal: Pain controlled with appropriate interventions Outcome: Completed/Met Date Met:  11/26/11 Pt. On NTG drip Goal: Initial discharge plan identified Outcome: Completed/Met Date Met:  11/26/11 Plan is to return home with family.

## 2011-11-26 NOTE — Progress Notes (Signed)
*  PRELIMINARY RESULTS* Pre-CABG Dopplers completed. No evidence of ICA stenosis. Vertebral artery flow is antegrade. Palmer arch evaluation -  Doppler waveforms remained normal bilaterally with radial and ulnar compressions. ABIs and Doppler waveforms are normal bilaterally at rest.  Milta Deiters, [place] D 11/26/2011, 10:50 AM

## 2011-11-26 NOTE — Progress Notes (Signed)
Subjective:  60 year old with severe 2 v CAD (OM and LAD) normal EF, DM. Here with AMI, NSTEMI, [place]. CP free currently. No SOB. Comfortable. No c/o of chest pain today, although she says that yesterday she had a 5-7/10 pain scale most of the day. She did not state yesterday am that she was having any pain. She is just back from vascular lab.  Objective:  Vital Signs in the last 24 hours: Temp:  [97.9 F (36.6 C)-98.4 F (36.9 C)] 98 F (36.7 C) (12/18 0400) Pulse Rate:  [66-84] 74  (12/18 0800) Resp:  [14-21] 20  (12/17 2100) BP: (97-134)/(49-77) 109/64 mmHg (12/18 0700) SpO2:  [91 %-98 %] 93 % (12/18 0800) Weight:  [75.6 kg (166 lb 10.7 oz)] 166 lb 10.7 oz (75.6 kg) (12/18 0500)  Intake/Output from previous day: 12/17 0701 - 12/18 0700 In: 2100 [P.O.:840; I.V.:1260] Out: 2400 [Urine:2400]   Physical Exam: General: Well developed, well nourished, in no acute distress. Head:  Normocephalic and atraumatic. Lungs: Clear to auscultation and percussion. Heart: Normal S1 and S2.  2/6 S murmur RUSB, rubs or gallops.  Pulses: Pulses normal in all 4 extremities. Extremities: No clubbing or cyanosis. No edema. Neurologic: Alert and oriented x 3.    Lab Results:  [facility] 11/26/11 0527 11/25/11 0904  WBC 5.0 6.7  HGB 12.9 13.1  PLT 87* 115*    Basename 11/26/11 0527 11/25/11 0904  NA 139 138  K 4.4 4.2  CL 104 102  CO2 30 30  GLUCOSE 129* 238*  BUN 10 17  CREATININE 0.67 0.66    Basename 11/25/11 1809 11/25/11 1116  TROPONINI 1.58* 1.72*   Hepatic Function Panel  Basename 11/25/11 0904  PROT 6.0  ALBUMIN 3.1*  AST 66*  ALT 55*  ALKPHOS 151*  BILITOT 0.8  BILIDIR --  IBILI --  Imaging: Dg Chest Port 1 View  11/25/2011  *RADIOLOGY REPORT*  Clinical Data: Chest pain, shortness of breath.  PORTABLE CHEST - 1 VIEW  Comparison: 01/03/2008  Findings: Heart size upper normal limits to mildly enlarged.  Mild central vascular congestion.  Interstitial prominence may be  exaggerated by poor inspiratory effort.  No definite focal areas of consolidation.  No pleural effusion or pneumothorax.  No acute osseous abnormality.  IMPRESSION: Heart size upper normal limits with central vascular congestion. No overt edema.  Interstitial crowding is likely exaggerated by technique/respiratory effort.  Original Report Authenticated By: Waneta Martins, M.D.    EKG:  Tele - personally viewed, no VT    Assessment/Plan:  Principal Problem:  *Unstable angina pectoris Active Problems:  CAD (coronary artery disease)   NSTEMI - continue with heparin, integrillin IV, NTG IV, atenolol, simvastatin. She is currently CP free. Trop as high as 1.7. Await CABG in am. Dr. Tyrone Sage. Appreciate consult.   CAD - as above. LAD and OM.   Systolic murmur - no aortic stenosis on echo, just sclerosis. Normal EF.   DM - stable  Thrombocytopenia - plts are decreased likely secondary to integrillin. I will repeat this afternoon. If decreased again, I will stop integrillin. They were 109 on admit now 59.  HTN - stable.   Denise Cuevas 11/26/2011, 10:44 AM

## 2011-11-26 NOTE — Progress Notes (Signed)
ANTICOAGULATION CONSULT NOTE - Initial Consult  Pharmacy Consult for heparin + integrilin Indication: unstable angina  Allergies  Allergen Reactions  . Byetta Nausea And Vomiting  . Crestor (Rosuvastatin Calcium) Nausea And Vomiting    Patient Measurements: Height: 5\' 2"  (157.5 cm) Weight: 162 lb 0.6 oz (73.5 kg) IBW/kg (Calculated) : 50.1   Vital Signs: Temp: 98 F (36.7 C) (12/18 0400) Temp src: Oral (12/18 0400) BP: 111/63 mmHg (12/18 0500) Pulse Rate: 72  (12/18 0500)  Labs:  Basename 11/26/11 0527 11/25/11 1809 11/25/11 1116 11/25/11 0904 11/25/11 0231 11/25/11 0216  HGB 12.9 -- -- 13.1 -- --  HCT 37.3 -- -- 37.6 41.0 --  PLT 87* -- -- 115* -- 109*  APTT -- -- -- 88* -- 30  LABPROT -- -- -- 14.5 -- 13.9  INR -- -- -- 1.11 -- 1.05  HEPARINUNFRC 0.18* -- -- 0.30 -- --  CREATININE -- -- -- 0.66 0.80 0.75  CKTOTAL -- 77 96 101 -- --  CKMB -- 9.6* 12.2* 12.5* -- --  TROPONINI -- 1.58* 1.72* 2.12* -- --   Estimated Creatinine Clearance: 72 ml/min (by C-G formula based on Cr of 0.66).  Assessment: 60 yo female with CAD, awaiting CABG tomorrow, for Heparin/Integrilin   Goal of Therapy:  Heparin level 0.3-0.5   Plan:  Increase Heparin 1100 units/hr Check heparin level in 8 hours.  Geannie Risen, PharmD, BCPS 11/26/2011,6:24 AM

## 2011-11-26 NOTE — Progress Notes (Signed)
[facility] DAILY PROGRESS NOTE                   [address]            [place] [address]          [phone]        Procedure(s) (LRB): CORONARY ARTERY BYPASS GRAFTING (CABG)  Planned for tomorrow  Total Length of Stay:  LOS: 1 day   Subjective: No chest pain today  Objective: Vital signs in last 24 hours: Patient Vitals for the past 24 hrs:  BP Temp Temp src Pulse Resp SpO2 Weight  11/26/11 1600 121/64 mmHg - - 75  - 94 % -  11/26/11 1500 114/66 mmHg - - 76  - 96 % -  11/26/11 1400 118/45 mmHg - - 75  - 95 % -  11/26/11 1200 104/55 mmHg - - 77  - 95 % -  11/26/11 1100 112/62 mmHg 98.2 F (36.8 C) Oral 85  - 98 % -  11/26/11 0800 - - - 74  - 93 % -  11/26/11 0700 109/64 mmHg - - 72  - 92 % -  11/26/11 0600 97/77 mmHg - - 72  - 94 % -  11/26/11 0500 111/63 mmHg - - 72  - 93 % 166 lb 10.7 oz (75.6 kg)  11/26/11 0400 109/61 mmHg 98 F (36.7 C) Oral 69  - 95 % -  11/26/11 0300 97/49 mmHg - - 71  - 91 % -  11/26/11 0200 103/57 mmHg - - 78  - 92 % -  11/26/11 0100 117/63 mmHg - - 71  - 94 % -  11/26/11 0000 109/49 mmHg 98.4 F (36.9 C) Oral 77  - 91 % -  11/25/11 2300 128/66 mmHg - - 82  - 94 % -  11/25/11 2200 134/67 mmHg - - 79  - 93 % -  11/25/11 2100 129/68 mmHg - - 80  20  93 % -  11/25/11 2000 131/70 mmHg 98.1 F (36.7 C) Oral 84  16  95 % -  11/25/11 1900 131/68 mmHg - - 81  21  92 % -  11/25/11 1800 131/68 mmHg - - 81  19  96 % -   Wt Readings from Last 3 Encounters:  11/26/11 166 lb 10.7 oz (75.6 kg)  11/19/11 166 lb (75.297 kg)  11/19/11 166 lb (75.297 kg)        Intake/Output from previous day: 12/17 0701 - 12/18 0700 In: 2103 [P.O.:840; I.V.:1263] Out: 2400 [Urine:2400]  Intake/Output this shift: Total I/O In: 857.3 [P.O.:440; I.V.:417.3] Out: 1000 [Urine:1000]  Current Meds: Scheduled Meds:   . albuterol  2.5 mg Nebulization Once  . aspirin EC  81 mg Oral Daily  . atenolol  25 mg Oral Daily  . DULoxetine  90 mg Oral Daily   . insulin aspart  0-9 Units Subcutaneous TID WC  . insulin glargine  24 Units Subcutaneous Daily  . levothyroxine  125 mcg Oral QAC breakfast  . metFORMIN  1,500 mg Oral Q breakfast  . pantoprazole  40 mg Oral Q1200  . simvastatin  10 mg Oral q1800  . sodium chloride  3 mL Intravenous Q12H  . Vitamin D (Ergocalciferol)  50,000 Units Oral Weekly  . DISCONTD: hydrocortisone sod succinate (SOLU-CORTEF) injection  100 mg Intravenous Q8H  . DISCONTD: hydrocortisone sod succinate (SOLU-CORTEF) injection  200 mg Intravenous Daily  . DISCONTD: sodium chloride  500 mL Intravenous Once  Continuous Infusions:   . eptifibatide 2 mcg/kg/min (11/26/11 1600)  . heparin 1,250 Units (11/26/11 1600)  . nitroGLYCERIN 5 mcg/min (11/26/11 1600)  . DISCONTD: fentaNYL infusion INTRAVENOUS    . DISCONTD: heparin 12 Units/kg/hr (11/25/11 2320)  . DISCONTD: midazolam (VERSED) infusion     PRN Meds:.sodium chloride, acetaminophen, ALPRAZolam, budesonide-formoterol, nitroGLYCERIN, sodium chloride, DISCONTD: fentaNYL, DISCONTD: midazolam  General appearance: alert, cooperative and no distress Neurologic: intact Heart: regular rate and rhythm, S1, S2 normal, no murmur, click, rub or gallop and normal apical impulse Lungs: clear to auscultation bilaterally Abdomen: soft, non-tender; bowel sounds normal; no masses,  no organomegaly Extremities: extremities normal, atraumatic, no cyanosis or edema, Homans sign is negative, no sign of DVT and no edema, redness or tenderness in the calves or thighs  Lab Results: CBC: Basename 11/26/11 1332 11/26/11 0527  WBC 4.9 5.0  HGB 12.9 12.9  HCT 36.9 37.3  PLT 90* 87*   BMET:  Basename 11/26/11 0527 11/25/11 0904  NA 139 138  K 4.4 4.2  CL 104 102  CO2 30 30  GLUCOSE 129* 238*  BUN 10 17  CREATININE 0.67 0.66  CALCIUM 9.2 8.8    PT/INR:  Basename 11/25/11 0904  LABPROT 14.5  INR 1.11   Radiology: Dg Chest Port 1 View  11/25/2011  *RADIOLOGY REPORT*   Clinical Data: Chest pain, shortness of breath.  PORTABLE CHEST - 1 VIEW  Comparison: 01/03/2008  Findings: Heart size upper normal limits to mildly enlarged.  Mild central vascular congestion.  Interstitial prominence may be exaggerated by poor inspiratory effort.  No definite focal areas of consolidation.  No pleural effusion or pneumothorax.  No acute osseous abnormality.  IMPRESSION: Heart size upper normal limits with central vascular congestion. No overt edema.  Interstitial crowding is likely exaggerated by technique/respiratory effort.  Original Report Authenticated By: Waneta Martins, M.D.     Assessment/Plan:  Thombocytopenia No chest Pain today Acute MI on admission For CABG tomorrow  The goals risks and alternatives of the planned surgical procedure CABG have been discussed with the patient in detail. The risks of the procedure including death, infection, stroke, myocardial infarction, bleeding, blood transfusion have all been discussed specifically.  I have quoted Denise Cuevas a 2% of perioperative mortality and a complication rate as high as 25 %. The patient's questions have been answered.Denise Cuevas is willing  to proceed with the planned procedure.    Delight Ovens MD  Beeper [phone] Office [phone] 11/26/2011 5:29 PM

## 2011-11-27 ENCOUNTER — Encounter (HOSPITAL_COMMUNITY): Payer: Self-pay | Admitting: Anesthesiology

## 2011-11-27 ENCOUNTER — Inpatient Hospital Stay (HOSPITAL_COMMUNITY): Payer: BC Managed Care – PPO | Admitting: Anesthesiology

## 2011-11-27 ENCOUNTER — Other Ambulatory Visit: Payer: Self-pay | Admitting: Cardiothoracic Surgery

## 2011-11-27 ENCOUNTER — Encounter (HOSPITAL_COMMUNITY)
Admission: AD | Disposition: A | Discharge status: Home / Self Care | Payer: Self-pay | Source: Home / Self Care | Attending: Cardiothoracic Surgery

## 2011-11-27 ENCOUNTER — Encounter: Payer: BC Managed Care – PPO | Admitting: Thoracic Surgery (Cardiothoracic Vascular Surgery)

## 2011-11-27 ENCOUNTER — Inpatient Hospital Stay (HOSPITAL_COMMUNITY): Payer: BC Managed Care – PPO

## 2011-11-27 DIAGNOSIS — I2510 Atherosclerotic heart disease of native coronary artery without angina pectoris: Secondary | ICD-10-CM

## 2011-11-27 HISTORY — PX: CORONARY ARTERY BYPASS GRAFT: SHX141

## 2011-11-27 LAB — POCT I-STAT 3, ART BLOOD GAS (G3+)
Bicarbonate: 25.4 mEq/L — ABNORMAL HIGH (ref 20.0–24.0)
Bicarbonate: 24.3 mEq/L — ABNORMAL HIGH (ref 20.0–24.0)
O2 Saturation: 100 %
O2 Saturation: 99 %
O2 Saturation: 95 %
Patient temperature: 37
TCO2: 25 mmol/L (ref 0–100)
TCO2: 25 mmol/L (ref 0–100)
TCO2: 25 mmol/L (ref 0–100)
TCO2: 25 mmol/L (ref 0–100)
pCO2 arterial: 33.7 mmHg — ABNORMAL LOW (ref 35.0–45.0)
pCO2 arterial: 35.9 mmHg (ref 35.0–45.0)
pCO2 arterial: 40.7 mmHg (ref 35.0–45.0)
pCO2 arterial: 42.5 mmHg (ref 35.0–45.0)
pH, Arterial: 7.452 — ABNORMAL HIGH (ref 7.350–7.400)
pH, Arterial: 7.457 — ABNORMAL HIGH (ref 7.350–7.400)
pH, Arterial: 7.436 — ABNORMAL HIGH (ref 7.350–7.400)
pO2, Arterial: 282 mmHg — ABNORMAL HIGH (ref 80.0–100.0)
pO2, Arterial: 202 mmHg — ABNORMAL HIGH (ref 80.0–100.0)
pO2, Arterial: 126 mmHg — ABNORMAL HIGH (ref 80.0–100.0)
pO2, Arterial: 78 mmHg — ABNORMAL LOW (ref 80.0–100.0)

## 2011-11-27 LAB — PROTIME-INR
INR: 1.32 (ref 0.00–1.49)
Prothrombin Time: 16.6 seconds — ABNORMAL HIGH (ref 11.6–15.2)

## 2011-11-27 LAB — CBC
HCT: 32.2 % — ABNORMAL LOW (ref 36.0–46.0)
HCT: 36.3 % (ref 36.0–46.0)
Hemoglobin: 11.2 g/dL — ABNORMAL LOW (ref 12.0–15.0)
Hemoglobin: 12.8 g/dL (ref 12.0–15.0)
MCH: 30.8 pg (ref 26.0–34.0)
MCH: 30.5 pg (ref 26.0–34.0)
MCH: 31 pg (ref 26.0–34.0)
MCHC: 34.8 g/dL (ref 30.0–36.0)
MCHC: 35.3 g/dL (ref 30.0–36.0)
MCV: 88.7 fL (ref 78.0–100.0)
MCV: 87.7 fL (ref 78.0–100.0)
MCV: 87.9 fL (ref 78.0–100.0)
Platelets: 86 10*3/uL — ABNORMAL LOW (ref 150–400)
Platelets: 69 10*3/uL — ABNORMAL LOW (ref 150–400)
Platelets: 89 10*3/uL — ABNORMAL LOW (ref 150–400)
RBC: 3.67 MIL/uL — ABNORMAL LOW (ref 3.87–5.11)
RBC: 4.13 MIL/uL (ref 3.87–5.11)
RDW: 13.4 % (ref 11.5–15.5)
RDW: 13.1 % (ref 11.5–15.5)
RDW: 13.3 % (ref 11.5–15.5)
WBC: 5 10*3/uL (ref 4.0–10.5)
WBC: 5.9 10*3/uL (ref 4.0–10.5)
WBC: 10.3 10*3/uL (ref 4.0–10.5)

## 2011-11-27 LAB — POCT I-STAT 4, (NA,K, GLUC, HGB,HCT)
Glucose, Bld: 133 mg/dL — ABNORMAL HIGH (ref 70–99)
Glucose, Bld: 165 mg/dL — ABNORMAL HIGH (ref 70–99)
HCT: 32 % — ABNORMAL LOW (ref 36.0–46.0)
HCT: 20 % — ABNORMAL LOW (ref 36.0–46.0)
Hemoglobin: 10.9 g/dL — ABNORMAL LOW (ref 12.0–15.0)
Hemoglobin: 11.2 g/dL — ABNORMAL LOW (ref 12.0–15.0)
Hemoglobin: 6.8 g/dL — CL (ref 12.0–15.0)
Potassium: 3.5 mEq/L (ref 3.5–5.1)
Potassium: 3.9 mEq/L (ref 3.5–5.1)
Potassium: 3.8 mEq/L (ref 3.5–5.1)
Sodium: 140 mEq/L (ref 135–145)
Sodium: 138 mEq/L (ref 135–145)

## 2011-11-27 LAB — POCT I-STAT, CHEM 8
BUN: 6 mg/dL (ref 6–23)
Chloride: 105 mEq/L (ref 96–112)
Creatinine, Ser: 0.5 mg/dL (ref 0.50–1.10)
Glucose, Bld: 123 mg/dL — ABNORMAL HIGH (ref 70–99)
Potassium: 4.1 mEq/L (ref 3.5–5.1)

## 2011-11-27 LAB — POCT I-STAT 3, VENOUS BLOOD GAS (G3P V)
Bicarbonate: 22.6 mEq/L (ref 20.0–24.0)
pCO2, Ven: 35.6 mmHg — ABNORMAL LOW (ref 45.0–50.0)
pH, Ven: 7.402 — ABNORMAL HIGH (ref 7.250–7.300)
pO2, Ven: 37 mmHg (ref 30.0–45.0)

## 2011-11-27 LAB — GLUCOSE, CAPILLARY
Glucose-Capillary: 128 mg/dL — ABNORMAL HIGH (ref 70–99)
Glucose-Capillary: 127 mg/dL — ABNORMAL HIGH (ref 70–99)
Glucose-Capillary: 116 mg/dL — ABNORMAL HIGH (ref 70–99)
Glucose-Capillary: 129 mg/dL — ABNORMAL HIGH (ref 70–99)
Glucose-Capillary: 122 mg/dL — ABNORMAL HIGH (ref 70–99)

## 2011-11-27 LAB — LIPID PANEL
Cholesterol: 179 mg/dL (ref 0–200)
HDL: 60 mg/dL (ref >39)
Total CHOL/HDL Ratio: 3 RATIO
Triglycerides: 93 mg/dL (ref <150)

## 2011-11-27 LAB — BASIC METABOLIC PANEL
BUN: 9 mg/dL (ref 6–23)
CO2: 29 mEq/L (ref 19–32)
Calcium: 9.1 mg/dL (ref 8.4–10.5)
Chloride: 106 mEq/L (ref 96–112)
Creatinine, Ser: 0.69 mg/dL (ref 0.50–1.10)
GFR calc Af Amer: >90 mL/min (ref >90)
GFR calc non Af Amer: >90 mL/min (ref >90)
Glucose, Bld: 127 mg/dL — ABNORMAL HIGH (ref 70–99)
Potassium: 3.6 mEq/L (ref 3.5–5.1)
Sodium: 141 mEq/L (ref 135–145)

## 2011-11-27 LAB — HEMOGLOBIN AND HEMATOCRIT, BLOOD
HCT: 21.5 % — ABNORMAL LOW (ref 36.0–46.0)
Hemoglobin: 7.6 g/dL — ABNORMAL LOW (ref 12.0–15.0)

## 2011-11-27 LAB — CREATININE, SERUM
Creatinine, Ser: 0.53 mg/dL (ref 0.50–1.10)
GFR calc Af Amer: >90 mL/min (ref >90)
GFR calc non Af Amer: >90 mL/min (ref >90)

## 2011-11-27 LAB — APTT: aPTT: 34 seconds (ref 24–37)

## 2011-11-27 LAB — PLATELET COUNT: Platelets: 62 10*3/uL — ABNORMAL LOW (ref 150–400)

## 2011-11-27 LAB — MAGNESIUM: Magnesium: 2.5 mg/dL (ref 1.5–2.5)

## 2011-11-27 SURGERY — CORONARY ARTERY BYPASS GRAFTING (CABG)
Anesthesia: General | Site: Chest | Wound class: Clean

## 2011-11-27 MED ORDER — VANCOMYCIN HCL 1000 MG IV SOLR
1000.0000 mg | Freq: Once | INTRAVENOUS | Status: AC
  Administered 2011-11-27: 1000 mg via INTRAVENOUS
  Filled 2011-11-27 (×2): qty 1000

## 2011-11-27 MED ORDER — BISACODYL 10 MG RE SUPP: 10.0000 mg | Freq: Every day | RECTAL | Status: DC

## 2011-11-27 MED ORDER — SODIUM CHLORIDE 0.9 % IV SOLN
INTRAVENOUS | Status: DC
  Administered 2011-11-29: 7.6 [IU]/h via INTRAVENOUS
  Filled 2011-11-27 (×2): qty 1

## 2011-11-27 MED ORDER — PROPOFOL 10 MG/ML IV EMUL
INTRAVENOUS | Status: DC | PRN
  Administered 2011-11-27: 70 mg via INTRAVENOUS

## 2011-11-27 MED ORDER — DEXTROSE 5 % IV SOLN
INTRAVENOUS | Status: DC | PRN
  Administered 2011-11-27 (×2): via INTRAVENOUS

## 2011-11-27 MED ORDER — SODIUM CHLORIDE 0.9 % IV SOLN
5.0000 g | INTRAVENOUS | Status: DC
  Filled 2011-11-27: qty 20

## 2011-11-27 MED ORDER — SODIUM CHLORIDE 0.9 % IJ SOLN
3.0000 mL | Freq: Two times a day (BID) | INTRAMUSCULAR | Status: DC
  Administered 2011-11-28 (×2): 3 mL via INTRAVENOUS

## 2011-11-27 MED ORDER — HEPARIN SODIUM (PORCINE) 1000 UNIT/ML IJ SOLN
INTRAMUSCULAR | Status: DC | PRN
  Administered 2011-11-27: 16000 [IU] via INTRAVENOUS

## 2011-11-27 MED ORDER — TRAMADOL HCL 50 MG PO TABS
50.0000 mg | ORAL_TABLET | ORAL | Status: DC | PRN
  Administered 2011-11-28: 100 mg via ORAL
  Filled 2011-11-27: qty 2

## 2011-11-27 MED ORDER — SODIUM CHLORIDE 0.9 % IV SOLN
0.1000 ug/kg/h | INTRAVENOUS | Status: DC
  Filled 2011-11-27 (×2): qty 2

## 2011-11-27 MED ORDER — MIDAZOLAM HCL 2 MG/2ML IJ SOLN
2.0000 mg | INTRAMUSCULAR | Status: DC | PRN
Start: 2011-11-27 — End: 2011-11-29

## 2011-11-27 MED ORDER — SODIUM CHLORIDE 0.9 % IV SOLN: INTRAVENOUS | Status: DC

## 2011-11-27 MED ORDER — MIDAZOLAM HCL 5 MG/5ML IJ SOLN
INTRAMUSCULAR | Status: DC | PRN
  Administered 2011-11-27: 2 mg via INTRAVENOUS
  Administered 2011-11-27: 3 mg via INTRAVENOUS
  Administered 2011-11-27 (×2): 2 mg via INTRAVENOUS
  Administered 2011-11-27: 1 mg via INTRAVENOUS

## 2011-11-27 MED ORDER — LEVOTHYROXINE SODIUM 125 MCG PO TABS
125.0000 ug | ORAL_TABLET | Freq: Every day | ORAL | Status: DC
  Administered 2011-11-28 – 2011-12-02 (×5): 125 ug via ORAL
  Filled 2011-11-27 (×6): qty 1

## 2011-11-27 MED ORDER — MORPHINE SULFATE 4 MG/ML IJ SOLN
2.0000 mg | INTRAMUSCULAR | Status: DC | PRN
  Administered 2011-11-27: 2 mg via INTRAVENOUS
  Administered 2011-11-27 (×3): 4 mg via INTRAVENOUS
  Administered 2011-11-27: 2 mg via INTRAVENOUS
  Administered 2011-11-28 (×3): 4 mg via INTRAVENOUS
  Filled 2011-11-27 (×8): qty 1

## 2011-11-27 MED ORDER — ACETAMINOPHEN 650 MG RE SUPP
650.0000 mg | RECTAL | Status: AC
  Administered 2011-11-27: 650 mg via RECTAL

## 2011-11-27 MED ORDER — SODIUM CHLORIDE 0.9 % IV SOLN
INTRAVENOUS | Status: DC | PRN
  Administered 2011-11-27: 09:00:00 via INTRAVENOUS

## 2011-11-27 MED ORDER — PHENYLEPHRINE HCL 10 MG/ML IJ SOLN
0.0000 ug/min | INTRAVENOUS | Status: DC
  Administered 2011-11-27: 10 ug/min via INTRAVENOUS
  Filled 2011-11-27: qty 2

## 2011-11-27 MED ORDER — SODIUM CHLORIDE 0.9 % IJ SOLN
OROMUCOSAL | Status: DC | PRN
  Administered 2011-11-27 (×2): via TOPICAL

## 2011-11-27 MED ORDER — MAGNESIUM SULFATE 40 MG/ML IJ SOLN
4.0000 g | Freq: Once | INTRAMUSCULAR | Status: AC
  Administered 2011-11-27: 4 g via INTRAVENOUS
  Filled 2011-11-27: qty 100

## 2011-11-27 MED ORDER — DEXTROSE 5 % IV SOLN
1.5000 g | Freq: Two times a day (BID) | INTRAVENOUS | Status: DC
  Filled 2011-11-27 (×2): qty 1.5

## 2011-11-27 MED ORDER — HEMOSTATIC AGENTS (NO CHARGE) OPTIME
TOPICAL | Status: DC | PRN
  Administered 2011-11-27: 1 via TOPICAL

## 2011-11-27 MED ORDER — SODIUM CHLORIDE 0.9 % IJ SOLN: 3.0000 mL | INTRAMUSCULAR | Status: DC | PRN

## 2011-11-27 MED ORDER — ALBUMIN HUMAN 5 % IV SOLN
250.0000 mL | INTRAVENOUS | Status: AC | PRN
  Administered 2011-11-27: 250 mL via INTRAVENOUS

## 2011-11-27 MED ORDER — LACTATED RINGERS IV SOLN
INTRAVENOUS | Status: DC | PRN
  Administered 2011-11-27 (×4): via INTRAVENOUS

## 2011-11-27 MED ORDER — DEXTROSE 5 % IV SOLN
1.5000 g | Freq: Two times a day (BID) | INTRAVENOUS | Status: DC
  Administered 2011-11-28 (×3): 1.5 g via INTRAVENOUS
  Filled 2011-11-27 (×4): qty 1.5

## 2011-11-27 MED ORDER — ONDANSETRON HCL 4 MG/2ML IJ SOLN
4.0000 mg | Freq: Four times a day (QID) | INTRAMUSCULAR | Status: DC | PRN
  Filled 2011-11-27: qty 2

## 2011-11-27 MED ORDER — PANTOPRAZOLE SODIUM 40 MG PO TBEC: 40.0000 mg | DELAYED_RELEASE_TABLET | Freq: Every day | ORAL | Status: DC

## 2011-11-27 MED ORDER — ACETAMINOPHEN 500 MG PO TABS
1000.0000 mg | ORAL_TABLET | Freq: Four times a day (QID) | ORAL | Status: DC
  Administered 2011-11-28 (×3): 1000 mg via ORAL
  Filled 2011-11-27 (×8): qty 2

## 2011-11-27 MED ORDER — PAPAVERINE HCL 30 MG/ML IJ SOLN
INTRAMUSCULAR | Status: DC | PRN
  Administered 2011-11-27: 60 mg via INTRAVENOUS

## 2011-11-27 MED ORDER — LACTATED RINGERS IV SOLN: 500.0000 mL | Freq: Once | INTRAVENOUS | Status: AC | PRN

## 2011-11-27 MED ORDER — MORPHINE SULFATE 2 MG/ML IJ SOLN: 1.0000 mg | INTRAMUSCULAR | Status: AC | PRN

## 2011-11-27 MED ORDER — FENTANYL CITRATE 0.05 MG/ML IJ SOLN
INTRAMUSCULAR | Status: DC | PRN
  Administered 2011-11-27: 100 ug via INTRAVENOUS
  Administered 2011-11-27 (×2): 50 ug via INTRAVENOUS
  Administered 2011-11-27: 100 ug via INTRAVENOUS
  Administered 2011-11-27: 200 ug via INTRAVENOUS
  Administered 2011-11-27: 150 ug via INTRAVENOUS
  Administered 2011-11-27: 100 ug via INTRAVENOUS
  Administered 2011-11-27: 50 ug via INTRAVENOUS
  Administered 2011-11-27 (×2): 100 ug via INTRAVENOUS
  Administered 2011-11-27: 50 ug via INTRAVENOUS
  Administered 2011-11-27: 100 ug via INTRAVENOUS
  Administered 2011-11-27 (×2): 50 ug via INTRAVENOUS

## 2011-11-27 MED ORDER — SODIUM CHLORIDE 0.9 % IV SOLN
250.0000 mL | INTRAVENOUS | Status: DC
  Administered 2011-11-29: 06:00:00 via INTRAVENOUS

## 2011-11-27 MED ORDER — SODIUM CHLORIDE 0.45 % IV SOLN: INTRAVENOUS | Status: DC

## 2011-11-27 MED ORDER — ASPIRIN EC 325 MG PO TBEC
325.0000 mg | DELAYED_RELEASE_TABLET | Freq: Every day | ORAL | Status: DC
  Administered 2011-11-28: 325 mg via ORAL
  Filled 2011-11-27 (×2): qty 1

## 2011-11-27 MED ORDER — ACETAMINOPHEN 160 MG/5ML PO SOLN
975.0000 mg | Freq: Four times a day (QID) | ORAL | Status: DC
  Filled 2011-11-27: qty 40.6

## 2011-11-27 MED ORDER — VECURONIUM BROMIDE 10 MG IV SOLR
INTRAVENOUS | Status: DC | PRN
  Administered 2011-11-27 (×2): 5 mg via INTRAVENOUS
  Administered 2011-11-27: 2 mg via INTRAVENOUS

## 2011-11-27 MED ORDER — ROCURONIUM BROMIDE 100 MG/10ML IV SOLN
INTRAVENOUS | Status: DC | PRN
  Administered 2011-11-27 (×2): 50 mg via INTRAVENOUS

## 2011-11-27 MED ORDER — PROTAMINE SULFATE 10 MG/ML IV SOLN
INTRAVENOUS | Status: DC | PRN
  Administered 2011-11-27 (×3): 50 mg via INTRAVENOUS
  Administered 2011-11-27: 20 mg via INTRAVENOUS

## 2011-11-27 MED ORDER — METOPROLOL TARTRATE 12.5 MG HALF TABLET
12.5000 mg | ORAL_TABLET | Freq: Two times a day (BID) | ORAL | Status: DC
Start: 2011-11-27 — End: 2011-11-29
  Administered 2011-11-28: 12.5 mg via ORAL
  Filled 2011-11-27 (×5): qty 1

## 2011-11-27 MED ORDER — LACTATED RINGERS IV SOLN: INTRAVENOUS | Status: DC

## 2011-11-27 MED ORDER — ACETAMINOPHEN 160 MG/5ML PO SOLN: 650.0000 mg | ORAL | Status: AC

## 2011-11-27 MED ORDER — AMINOCAPROIC ACID 250 MG/ML IV SOLN
INTRAVENOUS | Status: DC | PRN
  Administered 2011-11-27: 5 g via INTRAVENOUS
  Administered 2011-11-27: 1 g via INTRAVENOUS

## 2011-11-27 MED ORDER — DOCUSATE SODIUM 100 MG PO CAPS
200.0000 mg | ORAL_CAPSULE | Freq: Every day | ORAL | Status: DC
  Administered 2011-11-28: 200 mg via ORAL
  Filled 2011-11-27: qty 2

## 2011-11-27 MED ORDER — ASPIRIN 81 MG PO CHEW: 324.0000 mg | CHEWABLE_TABLET | Freq: Every day | ORAL | Status: DC

## 2011-11-27 MED ORDER — METOPROLOL TARTRATE 25 MG/10 ML ORAL SUSPENSION
12.5000 mg | Freq: Two times a day (BID) | ORAL | Status: DC
  Administered 2011-11-28: 12.5 mg
  Filled 2011-11-27 (×5): qty 5

## 2011-11-27 MED ORDER — BISACODYL 5 MG PO TBEC
10.0000 mg | DELAYED_RELEASE_TABLET | Freq: Every day | ORAL | Status: DC
  Administered 2011-11-28: 10 mg via ORAL
  Filled 2011-11-27: qty 2

## 2011-11-27 MED ORDER — NITROGLYCERIN IN D5W 200-5 MCG/ML-% IV SOLN: 0.0000 ug/min | INTRAVENOUS | Status: DC

## 2011-11-27 MED ORDER — METOPROLOL TARTRATE 1 MG/ML IV SOLN: 2.5000 mg | INTRAVENOUS | Status: DC | PRN

## 2011-11-27 MED ORDER — FAMOTIDINE IN NACL 20-0.9 MG/50ML-% IV SOLN
20.0000 mg | Freq: Two times a day (BID) | INTRAVENOUS | Status: AC
  Administered 2011-11-28: 20 mg via INTRAVENOUS
  Filled 2011-11-27: qty 50

## 2011-11-27 MED ORDER — POTASSIUM CHLORIDE 10 MEQ/50ML IV SOLN
10.0000 meq | INTRAVENOUS | Status: AC
  Administered 2011-11-27 (×2): 10 meq via INTRAVENOUS

## 2011-11-27 MED FILL — Potassium Chloride Inj 2 mEq/ML: INTRAVENOUS | Qty: 40 | Status: AC

## 2011-11-27 MED FILL — Magnesium Sulfate Inj 50%: INTRAMUSCULAR | Qty: 10 | Status: AC

## 2011-11-27 SURGICAL SUPPLY — 120 items
ATTRACTOMAT 16X20 MAGNETIC DRP (DRAPES) ×2 IMPLANT
BAG DECANTER FOR FLEXI CONT (MISCELLANEOUS) ×2 IMPLANT
BANDAGE ELASTIC 4 VELCRO ST LF (GAUZE/BANDAGES/DRESSINGS) ×2 IMPLANT
BANDAGE ELASTIC 6 VELCRO ST LF (GAUZE/BANDAGES/DRESSINGS) ×2 IMPLANT
BANDAGE GAUZE ELAST BULKY 4 IN (GAUZE/BANDAGES/DRESSINGS) ×2 IMPLANT
BLADE SAW STERNAL (BLADE) ×2 IMPLANT
BLADE SURG 11 STRL SS (BLADE) ×2 IMPLANT
BLADE SURG ROTATE 9660 (MISCELLANEOUS) IMPLANT
CANISTER SUCTION 2500CC (MISCELLANEOUS) ×2 IMPLANT
CANN PRFSN .5XCNCT 15X34-48 (MISCELLANEOUS) ×1
CANNULA PRFSN .5XCNCT 15X34-48 (MISCELLANEOUS) ×1 IMPLANT
CANNULA VEN 2 STAGE (MISCELLANEOUS) ×1
CANNULA VESSEL W/WING WO/VALVE (CANNULA) IMPLANT
CATH CPB KIT GERHARDT (MISCELLANEOUS) ×2 IMPLANT
CATH ROBINSON RED A/P 18FR (CATHETERS) IMPLANT
CATH THORACIC 28FR (CATHETERS) ×2 IMPLANT
CATH THORACIC 28FR RT ANG (CATHETERS) IMPLANT
CATH THORACIC 36FR (CATHETERS) IMPLANT
CATH THORACIC 36FR RT ANG (CATHETERS) IMPLANT
CLIP FOGARTY SPRING 6M (CLIP) IMPLANT
CLIP RETRACTION 3.0MM CORONARY (MISCELLANEOUS) ×2 IMPLANT
CLIP TI MEDIUM 24 (CLIP) IMPLANT
CLIP TI WIDE RED SMALL 24 (CLIP) IMPLANT
CLOTH BEACON ORANGE TIMEOUT ST (SAFETY) ×2 IMPLANT
COVER SURGICAL LIGHT HANDLE (MISCELLANEOUS) ×4 IMPLANT
CRADLE DONUT ADULT HEAD (MISCELLANEOUS) ×2 IMPLANT
DRAIN CHANNEL 28F RND 3/8 FF (WOUND CARE) ×2 IMPLANT
DRAIN CHANNEL 32F RND 10.7 FF (WOUND CARE) IMPLANT
DRAPE CARDIOVASCULAR INCISE (DRAPES) ×1
DRAPE SLUSH MACHINE 52X66 (DRAPES) ×2 IMPLANT
DRAPE SLUSH/WARMER DISC (DRAPES) IMPLANT
DRAPE SRG 135X102X78XABS (DRAPES) ×1 IMPLANT
DRSG COVADERM 4X14 (GAUZE/BANDAGES/DRESSINGS) ×2 IMPLANT
ELECT BLADE 4.0 EZ CLEAN MEGAD (MISCELLANEOUS) ×2
ELECT CAUTERY BLADE 6.4 (BLADE) ×2 IMPLANT
ELECT REM PT RETURN 9FT ADLT (ELECTROSURGICAL) ×4
ELECTRODE BLDE 4.0 EZ CLN MEGD (MISCELLANEOUS) ×1 IMPLANT
ELECTRODE REM PT RTRN 9FT ADLT (ELECTROSURGICAL) ×2 IMPLANT
GLOVE BIO SURGEON STRL SZ 6 (GLOVE) ×8 IMPLANT
GLOVE BIO SURGEON STRL SZ 6.5 (GLOVE) ×16 IMPLANT
GLOVE BIO SURGEON STRL SZ7 (GLOVE) IMPLANT
GLOVE BIO SURGEON STRL SZ7.5 (GLOVE) IMPLANT
GLOVE BIO SURGEON STRL SZ8 (GLOVE) ×6 IMPLANT
GLOVE BIOGEL PI IND STRL 6 (GLOVE) ×2 IMPLANT
GLOVE BIOGEL PI IND STRL 6.5 (GLOVE) ×3 IMPLANT
GLOVE BIOGEL PI IND STRL 7.0 (GLOVE) ×3 IMPLANT
GLOVE BIOGEL PI INDICATOR 6 (GLOVE) ×2
GLOVE BIOGEL PI INDICATOR 6.5 (GLOVE) ×3
GLOVE BIOGEL PI INDICATOR 7.0 (GLOVE) ×3
GLOVE EUDERMIC 7 POWDERFREE (GLOVE) IMPLANT
GLOVE ORTHO TXT STRL SZ7.5 (GLOVE) IMPLANT
GOWN BRE IMP SLV AUR XL STRL (GOWN DISPOSABLE) ×2 IMPLANT
GOWN STRL NON-REIN LRG LVL3 (GOWN DISPOSABLE) ×16 IMPLANT
HEMOSTAT POWDER SURGIFOAM 1G (HEMOSTASIS) ×4 IMPLANT
HEMOSTAT SURGICEL 2X14 (HEMOSTASIS) ×2 IMPLANT
INSERT FOGARTY 61MM (MISCELLANEOUS) IMPLANT
INSERT FOGARTY XLG (MISCELLANEOUS) IMPLANT
KIT BASIN OR (CUSTOM PROCEDURE TRAY) ×2 IMPLANT
KIT ROOM TURNOVER OR (KITS) ×2 IMPLANT
KIT SUCTION CATH 14FR (SUCTIONS) ×4 IMPLANT
KIT VASOVIEW W/TROCAR VH 2000 (KITS) ×2 IMPLANT
LEAD PACING MYOCARDI (MISCELLANEOUS) ×2 IMPLANT
MARKER GRAFT CORONARY BYPASS (MISCELLANEOUS) ×6 IMPLANT
NS IRRIG 1000ML POUR BTL (IV SOLUTION) ×14 IMPLANT
PACK OPEN HEART (CUSTOM PROCEDURE TRAY) ×2 IMPLANT
PAD ARMBOARD 7.5X6 YLW CONV (MISCELLANEOUS) ×4 IMPLANT
PENCIL BUTTON HOLSTER BLD 10FT (ELECTRODE) ×2 IMPLANT
PUNCH AORTIC ROTATE 4.0MM (MISCELLANEOUS) IMPLANT
PUNCH AORTIC ROTATE 4.5MM 8IN (MISCELLANEOUS) IMPLANT
PUNCH AORTIC ROTATE 5MM 8IN (MISCELLANEOUS) IMPLANT
SET CARDIOPLEGIA MPS 5001102 (MISCELLANEOUS) ×2 IMPLANT
SOLUTION ANTI FOG 6CC (MISCELLANEOUS) IMPLANT
SPONGE GAUZE 4X4 12PLY (GAUZE/BANDAGES/DRESSINGS) ×4 IMPLANT
SPONGE INTESTINAL PEANUT (DISPOSABLE) IMPLANT
SPONGE LAP 18X18 X RAY DECT (DISPOSABLE) ×4 IMPLANT
SPONGE LAP 4X18 X RAY DECT (DISPOSABLE) IMPLANT
SUT BONE WAX W31G (SUTURE) ×2 IMPLANT
SUT MNCRL AB 4-0 PS2 18 (SUTURE) ×2 IMPLANT
SUT PROLENE 3 0 SH 1 (SUTURE) ×2 IMPLANT
SUT PROLENE 3 0 SH DA (SUTURE) IMPLANT
SUT PROLENE 3 0 SH1 36 (SUTURE) IMPLANT
SUT PROLENE 4 0 RB 1 (SUTURE)
SUT PROLENE 4 0 SH DA (SUTURE) IMPLANT
SUT PROLENE 4 0 TF (SUTURE) ×4 IMPLANT
SUT PROLENE 4-0 RB1 .5 CRCL 36 (SUTURE) IMPLANT
SUT PROLENE 5 0 C 1 36 (SUTURE) IMPLANT
SUT PROLENE 6 0 C 1 30 (SUTURE) ×10 IMPLANT
SUT PROLENE 6 0 CC (SUTURE) ×6 IMPLANT
SUT PROLENE 7 0 BV 1 (SUTURE) IMPLANT
SUT PROLENE 7 0 BV1 MDA (SUTURE) ×2 IMPLANT
SUT PROLENE 7.0 RB 3 (SUTURE) IMPLANT
SUT PROLENE 8 0 BV175 6 (SUTURE) ×4 IMPLANT
SUT SILK  1 MH (SUTURE)
SUT SILK 1 MH (SUTURE) IMPLANT
SUT SILK 2 0 SH CR/8 (SUTURE) IMPLANT
SUT SILK 3 0 SH CR/8 (SUTURE) IMPLANT
SUT STEEL 6MS V (SUTURE) ×2 IMPLANT
SUT STEEL STERNAL CCS#1 18IN (SUTURE) ×2 IMPLANT
SUT STEEL SZ 6 DBL 3X14 BALL (SUTURE) ×2 IMPLANT
SUT TEM PAC WIRE 2 0 SH (SUTURE) ×2 IMPLANT
SUT VIC AB 1 CTX 18 (SUTURE) ×4 IMPLANT
SUT VIC AB 1 CTX 36 (SUTURE)
SUT VIC AB 1 CTX36XBRD ANBCTR (SUTURE) IMPLANT
SUT VIC AB 2-0 CT1 27 (SUTURE)
SUT VIC AB 2-0 CT1 TAPERPNT 27 (SUTURE) IMPLANT
SUT VIC AB 2-0 CTX 27 (SUTURE) IMPLANT
SUT VIC AB 3-0 SH 27 (SUTURE) ×1
SUT VIC AB 3-0 SH 27X BRD (SUTURE) ×1 IMPLANT
SUT VIC AB 3-0 X1 27 (SUTURE) IMPLANT
SUT VICRYL 4-0 PS2 18IN ABS (SUTURE) IMPLANT
SUTURE E-PAK OPEN HEART (SUTURE) ×2 IMPLANT
SYSTEM SAHARA CHEST DRAIN ATS (WOUND CARE) ×2 IMPLANT
TOWEL OR 17X24 6PK STRL BLUE (TOWEL DISPOSABLE) ×6 IMPLANT
TOWEL OR 17X26 10 PK STRL BLUE (TOWEL DISPOSABLE) ×6 IMPLANT
TRAY FOLEY IC TEMP SENS 14FR (CATHETERS) ×2 IMPLANT
TUBE FEEDING 8FR 16IN STR KANG (MISCELLANEOUS) ×2 IMPLANT
TUBE SUCT INTRACARD DLP 20F (MISCELLANEOUS) ×2 IMPLANT
TUBING INSUFFLATION 10FT LAP (TUBING) ×2 IMPLANT
UNDERPAD 30X30 INCONTINENT (UNDERPADS AND DIAPERS) ×2 IMPLANT
WATER STERILE IRR 1000ML POUR (IV SOLUTION) ×4 IMPLANT

## 2011-11-27 NOTE — Transfer of Care (Signed)
Immediate Anesthesia Transfer of Care Note  Patient: Denise Cuevas  Procedure(s) Performed:  CORONARY ARTERY BYPASS GRAFTING (CABG) - coronary artery bypass graft on pump times two utilizing left internal mammary artery and right saphenous vein harvested endoscopically   Patient Location: PACU and ICU  Anesthesia Type: General  Level of Consciousness: unresponsive  Airway & Oxygen Therapy: Patient remains intubated per anesthesia plan  Post-op Assessment: Report given to PACU RN and Post -op Vital signs reviewed and stable  Post vital signs: Reviewed and stable  Complications: No apparent anesthesia complications

## 2011-11-27 NOTE — Anesthesia Preprocedure Evaluation (Addendum)
Anesthesia Evaluation  Patient identified by MRN, date of birth, ID band Patient awake    Reviewed: Allergy & Precautions, H&P , NPO status , Patient's Chart, lab work & pertinent test results, reviewed documented beta blocker date and time   History of Anesthesia Complications (+) PONV  Airway Mallampati: II  Neck ROM: Full    Dental  (+) Teeth Intact and Dental Advisory Given   Pulmonary asthma ,  clear to auscultation        Cardiovascular hypertension, Pt. on home beta blockers and Pt. on medications + angina + CAD Regular Normal    Neuro/Psych  Headaches, PSYCHIATRIC DISORDERS Depression  Neuromuscular disease    GI/Hepatic hiatal hernia, GERD-  Medicated and Controlled,  Endo/Other  Diabetes mellitus-, Type 2, Insulin DependentHypothyroidism   Renal/GU      Musculoskeletal  (+) Fibromyalgia -  Abdominal (+) obese,   Peds  Hematology   Anesthesia Other Findings   Reproductive/Obstetrics                         Anesthesia Physical Anesthesia Plan  ASA: III  Anesthesia Plan: General   Post-op Pain Management:    Induction: Intravenous  Airway Management Planned: Oral ETT  Additional Equipment: Arterial line and PA Cath  Intra-op Plan:   Post-operative Plan: Post-operative intubation/ventilation  Informed Consent: I have reviewed the patients History and Physical, chart, labs and discussed the procedure including the risks, benefits and alternatives for the proposed anesthesia with the patient or authorized representative who has indicated his/her understanding and acceptance.   Dental advisory given  Plan Discussed with: Anesthesiologist, Surgeon and CRNA  Anesthesia Plan Comments:        Anesthesia Quick Evaluation

## 2011-11-27 NOTE — Anesthesia Procedure Notes (Addendum)
Performed by: Julianne Rice K   Procedure Name: Intubation Date/Time: 11/27/2011 8:48 AM Performed by: Julianne Rice K Pre-anesthesia Checklist: Patient identified, Timeout performed, Emergency Drugs available, Suction available and Patient being monitored Patient Re-evaluated:Patient Re-evaluated prior to inductionOxygen Delivery Method: Circle System Utilized Preoxygenation: Pre-oxygenation with 100% oxygen Intubation Type: IV induction Ventilation: Mask ventilation without difficulty Laryngoscope Size: Mac and 3 Grade View: Grade II Tube type: Oral Tube size: 8.0 mm Number of attempts: 1 Airway Equipment and Method: stylet Placement Confirmation: ETT inserted through vocal cords under direct vision,  positive ETCO2 and CO2 detector Secured at: 22 cm Tube secured with: Tape Dental Injury: Teeth and Oropharynx as per pre-operative assessment

## 2011-11-27 NOTE — Preoperative (Signed)
Beta Blockers   Reason not to administer Beta Blockers:Not Applicable 

## 2011-11-27 NOTE — Procedures (Signed)
Extubation Procedure Note  Patient Details:   Name: JAZZMEN RESTIVO DOB: Jul 16, 1951 MRN: [id number]   Airway Documentation:  Pt extubated to 4L [place] at 1730. Sats 96% No stridor noted. Pt able to vocalize name. NIf -20, VC .6L  Evaluation  O2 sats: stable throughout and currently acceptable Complications: No apparent complications Patient did tolerate procedure well. Bilateral Breath Sounds: Diminished Suctioning: Oral   Christie Beckers 11/27/2011,

## 2011-11-27 NOTE — Brief Op Note (Signed)
11/25/2011 - 11/27/2011  1:14 PM  PATIENT:  Denise Cuevas  60 y.o. female  PRE-OPERATIVE DIAGNOSIS:  Coronary Artery Disease with recent Subendocardial MI  POST-OPERATIVE DIAGNOSIS:  Coronary Artery Disease with recent Subendocardial MI .  PROCEDURE:  Procedure(s): CORONARY ARTERY BYPASS GRAFTING (CABG) x 2 LIMA to LAD, Vein to Cir with rt leg EVH removal of incidental left mammary chain lymph node   SURGEON:  Surgeon(s): Delight Ovens, MD  PHYSICIAN ASSISTANT: Cecile Sheerer  SA    ANESTHESIA:   general  EBL:  Total I/O In: 3808 [I.V.:3350; Blood:458] Out: 1000 [Urine:1000]  BLOOD ADMINISTERED:500 ml CELLSAVER  DRAINS: one Chest Tube(s) in the left chest and (one) Blake drain(s) in the mediastinium     SPECIMEN:  Source of Specimen:  left mammary chain lymph node  DISPOSITION OF SPECIMEN:  PATHOLOGY  COUNTS:  YES  DICTATION: .Other Dictation: Dictation Number   PLAN OF CARE: Admit to inpatient   PATIENT DISPOSITION:  ICU - intubated and hemodynamically stable.   Delay start of Pharmacological VTE agent (>24hrs) due to surgical blood loss or risk of bleeding:  Yes  Delight Ovens MD  Beeper [phone] Office [phone]

## 2011-11-28 ENCOUNTER — Inpatient Hospital Stay (HOSPITAL_COMMUNITY): Payer: BC Managed Care – PPO

## 2011-11-28 ENCOUNTER — Encounter (HOSPITAL_COMMUNITY): Payer: Self-pay | Admitting: Cardiothoracic Surgery

## 2011-11-28 DIAGNOSIS — IMO0001 Reserved for inherently not codable concepts without codable children: Secondary | ICD-10-CM

## 2011-11-28 DIAGNOSIS — E1165 Type 2 diabetes mellitus with hyperglycemia: Secondary | ICD-10-CM

## 2011-11-28 LAB — CBC
HCT: 37.9 % (ref 36.0–46.0)
HCT: 39.3 % (ref 36.0–46.0)
Hemoglobin: 13.3 g/dL (ref 12.0–15.0)
Hemoglobin: 13.8 g/dL (ref 12.0–15.0)
MCH: 30.9 pg (ref 26.0–34.0)
MCH: 31.2 pg (ref 26.0–34.0)
MCHC: 35.1 g/dL (ref 30.0–36.0)
MCHC: 35.1 g/dL (ref 30.0–36.0)
MCV: 88.1 fL (ref 78.0–100.0)
MCV: 88.9 fL (ref 78.0–100.0)
Platelets: 115 10*3/uL — ABNORMAL LOW (ref 150–400)
Platelets: 138 10*3/uL — ABNORMAL LOW (ref 150–400)
RBC: 4.3 MIL/uL (ref 3.87–5.11)
RBC: 4.42 MIL/uL (ref 3.87–5.11)
RDW: 13.6 % (ref 11.5–15.5)
RDW: 14 % (ref 11.5–15.5)
WBC: 16.2 10*3/uL — ABNORMAL HIGH (ref 4.0–10.5)
WBC: 22.5 10*3/uL — ABNORMAL HIGH (ref 4.0–10.5)

## 2011-11-28 LAB — GLUCOSE, CAPILLARY
Glucose-Capillary: 129 mg/dL — ABNORMAL HIGH (ref 70–99)
Glucose-Capillary: 116 mg/dL — ABNORMAL HIGH (ref 70–99)
Glucose-Capillary: 119 mg/dL — ABNORMAL HIGH (ref 70–99)
Glucose-Capillary: 112 mg/dL — ABNORMAL HIGH (ref 70–99)
Glucose-Capillary: 181 mg/dL — ABNORMAL HIGH (ref 70–99)

## 2011-11-28 LAB — CREATININE, SERUM
Creatinine, Ser: 0.6 mg/dL (ref 0.50–1.10)
GFR calc Af Amer: >90 mL/min (ref >90)
GFR calc non Af Amer: >90 mL/min (ref >90)

## 2011-11-28 LAB — POCT I-STAT, CHEM 8
BUN: 12 mg/dL (ref 6–23)
Chloride: 102 mEq/L (ref 96–112)
HCT: 40 % (ref 36.0–46.0)
Potassium: 4.2 mEq/L (ref 3.5–5.1)

## 2011-11-28 LAB — MAGNESIUM
Magnesium: 2.3 mg/dL (ref 1.5–2.5)
Magnesium: 2.1 mg/dL (ref 1.5–2.5)

## 2011-11-28 LAB — BASIC METABOLIC PANEL
BUN: 9 mg/dL (ref 6–23)
CO2: 23 mEq/L (ref 19–32)
Calcium: 8.4 mg/dL (ref 8.4–10.5)
Chloride: 106 mEq/L (ref 96–112)
Creatinine, Ser: 0.53 mg/dL (ref 0.50–1.10)
GFR calc Af Amer: >90 mL/min (ref >90)
GFR calc non Af Amer: >90 mL/min (ref >90)
Glucose, Bld: 120 mg/dL — ABNORMAL HIGH (ref 70–99)
Potassium: 4.1 mEq/L (ref 3.5–5.1)
Sodium: 137 mEq/L (ref 135–145)

## 2011-11-28 MED ORDER — DEXTROSE 5 % IV SOLN
30.0000 mg/h | INTRAVENOUS | Status: DC
  Filled 2011-11-28: qty 9

## 2011-11-28 MED ORDER — OXYCODONE HCL 5 MG PO TABS
5.0000 mg | ORAL_TABLET | ORAL | Status: DC | PRN
  Administered 2011-11-28 (×3): 5 mg via ORAL
  Filled 2011-11-28 (×2): qty 2
  Filled 2011-11-28: qty 1

## 2011-11-28 MED ORDER — FUROSEMIDE 10 MG/ML IJ SOLN
20.0000 mg | Freq: Two times a day (BID) | INTRAMUSCULAR | Status: AC
  Administered 2011-11-28 (×2): 20 mg via INTRAVENOUS
  Filled 2011-11-28 (×2): qty 2

## 2011-11-28 MED ORDER — INSULIN GLARGINE 100 UNIT/ML ~~LOC~~ SOLN
20.0000 [IU] | Freq: Two times a day (BID) | SUBCUTANEOUS | Status: DC
  Administered 2011-11-28: 20 [IU] via SUBCUTANEOUS

## 2011-11-28 MED ORDER — FUROSEMIDE 10 MG/ML IJ SOLN
20.0000 mg | Freq: Once | INTRAMUSCULAR | Status: AC
  Administered 2011-11-28: 20 mg via INTRAVENOUS
  Filled 2011-11-28: qty 2

## 2011-11-28 MED ORDER — LORAZEPAM 2 MG/ML IJ SOLN
1.0000 mg | Freq: Once | INTRAMUSCULAR | Status: AC
  Administered 2011-11-28: 1 mg via INTRAVENOUS
  Filled 2011-11-28: qty 1

## 2011-11-28 MED ORDER — INSULIN ASPART 100 UNIT/ML ~~LOC~~ SOLN
0.0000 [IU] | SUBCUTANEOUS | Status: DC
  Administered 2011-11-28: 4 [IU] via SUBCUTANEOUS
  Administered 2011-11-28: 12 [IU] via SUBCUTANEOUS
  Administered 2011-11-28: 2 [IU] via SUBCUTANEOUS
  Administered 2011-11-29: 4 [IU] via SUBCUTANEOUS
  Filled 2011-11-28: qty 3

## 2011-11-28 MED ORDER — INSULIN GLARGINE 100 UNIT/ML ~~LOC~~ SOLN
20.0000 [IU] | SUBCUTANEOUS | Status: DC
  Administered 2011-11-28: 20 [IU] via SUBCUTANEOUS
  Filled 2011-11-28: qty 3

## 2011-11-28 MED ORDER — MORPHINE SULFATE 2 MG/ML IJ SOLN
2.0000 mg | INTRAMUSCULAR | Status: AC | PRN
  Administered 2011-11-28: 2 mg via INTRAVENOUS
  Filled 2011-11-28: qty 1

## 2011-11-28 MED ORDER — DEXTROSE 5 % IV SOLN
60.0000 mg/h | INTRAVENOUS | Status: DC
  Administered 2011-11-29: 60 mg/h via INTRAVENOUS
  Filled 2011-11-28 (×2): qty 9

## 2011-11-28 MED ORDER — AMIODARONE LOAD VIA INFUSION
150.0000 mg | Freq: Once | INTRAVENOUS | Status: AC
  Administered 2011-11-29: 150 mg via INTRAVENOUS
  Filled 2011-11-28: qty 83.34

## 2011-11-28 NOTE — Progress Notes (Signed)
Patient ID: Denise Cuevas, female   DOB: 1951/05/28, 60 y.o.   MRN: [id number]                   [address]            [place] [address]          [phone]     BP 98/60  Pulse 78  Temp(Src) 97.9 F (36.6 C) (Oral)  Resp 18  Ht 5\' 2"  (1.575 m)  Wt 177 lb 7.5 oz (80.5 kg)  BMI 32.46 kg/m2  SpO2 93%  Walked around unit today Increase lantus dose Stable day Tubes out

## 2011-11-28 NOTE — Progress Notes (Signed)
Courtesy visit Mildly elevated temperature. Doing well. Spoke with Dr. Tyrone Sage.  Telemetry reviewed. Please call with any concerns or questions.

## 2011-11-28 NOTE — Anesthesia Postprocedure Evaluation (Signed)
  Anesthesia Post-op Note  Patient: Denise Cuevas  Procedure(s) Performed:  CORONARY ARTERY BYPASS GRAFTING (CABG) - coronary artery bypass graft on pump times two utilizing left internal mammary artery and right saphenous vein harvested endoscopically   Patient Location: PACU and SICU  Anesthesia Type: General  Level of Consciousness: awake, alert  and oriented  Airway and Oxygen Therapy: Patient Spontanous Breathing and Patient connected to nasal cannula oxygen  Post-op Pain: moderate  Post-op Assessment: Post-op Vital signs reviewed, Patient's Cardiovascular Status Stable, Respiratory Function Stable, No signs of Nausea or vomiting and Adequate PO intake  Post-op Vital Signs: Reviewed and stable  Complications: No apparent anesthesia complications

## 2011-11-28 NOTE — Op Note (Signed)
NAME:  Denise Cuevas, Denise Cuevas NO.:  MEDICAL RECORD NO.:  [id number]  LOCATION:                                 FACILITY:  PHYSICIAN:  Sheliah Plane, MD    DATE OF BIRTH:  10-05-1951  DATE OF PROCEDURE:  11/27/2011 DATE OF DISCHARGE:                              OPERATIVE REPORT   PREOPERATIVE DIAGNOSES:  Coronary occlusive disease with recent subendocardial myocardial infarction.  POSTOPERATIVE DIAGNOSES:  Coronary occlusive disease with recent subendocardial myocardial infarction.  SURGICAL PROCEDURE:  Coronary artery bypass grafting x2 with the left internal mammary to the left anterior descending coronary artery and reverse saphenous vein graft to the circumflex coronary artery, with endo-vein harvesting from the right thigh.  SURGEON:  Sheliah Plane, M.D.  FIRST ASSISTANT:  Cecile Sheerer SA.  BRIEF HISTORY:  The patient is a 60 year old diabetic female, who had presented with a 6-week history of increasing episodes of chest pain. The week prior to this admission as an outpatient, she underwent cardiac catheterization and was found to have a complex high-grade lesion of the LAD after the takeoff of the first diagonal.  She had no left main coronary artery, but separate ostium for the LAD and circumflex.  The circumflex in the proximal third of the vessel had a 80-90% stenosis. The right coronary artery had luminal irregularities, but no high-grade stenosis.  Overall ventricular function was preserved.  The patient went home after cardiac catheterization.  Prior to being evaluated for bypass surgery, she was readmitted with a prolonged episode of chest pain and elevation of cardiac enzymes.  She was started on heparin and Integrilin infusion and cardiac surgery consultation was obtained.  On reviewing the films, a coronary artery bypass grafting was recommended because of the complex LAD lesion and because of her history of diabetes.  The risks and  options of surgery were discussed with the patient in detail. She was agreeable and signed informed consent.  DESCRIPTION OF PROCEDURE:  A 6-hour prior to her surgery, her Integrilin was stopped.  Swan-Ganz and arterial line monitors were placed.  She underwent general endotracheal anesthesia without incidence.  The skin on the chest and legs was prepped with Betadine and draped in usual sterile manner.  Using the [employer] endo-vein harvesting system, a segment of vein was harvested from the right thigh and was of good quality and caliber.  Median sternotomy was performed.  Left internal mammary artery was dissected down as pedicle graft.  The distal artery was divided and had good free flow.  Pericardium was opened.  Overall ventricular function appeared preserved.  The patient did have significant amount of mediastinal fat.  She was systemically heparinized.  Ascending aorta was cannulated.  The right atrium was cannulated.  Aortic root vent cardioplegic needle was placed into the ascending aorta.  The patient was placed on cardiopulmonary bypass 2.4 L/minute/meter square.  The circumflex coronary artery was identified and suitable for bypass.  Aortic cross-clamp was applied and 800 mL cold- blood potassium cardioplegia was administered with diastolic arrest of the heart.  Myocardial temperature was monitored throughout the crossclamp.  The patient's body temp was cooled to 32 degrees.  Heart was elevated and the circumflex coronary artery was opened, admitted 1.5 mm probe distally.  Using a running 7-0 Prolene, distal anastomosis was performed with second reverse saphenous vein graft.  Attention was then turned to the left anterior descending coronary artery.  Between the mid and distal third, the vessel was opened and admitted a 1.5 mm probe distally.  Using a running 8-0 Prolene, left internal mammary artery was anastomosed to the left anterior descending coronary artery.   The bulldog was released from the mammary artery with rise in myocardial septal temperature.  The fashion of the pedicle was tacked to the epicardium.  Bulldog was placed back on the mammary artery.  Additional cold-blood cardioplegia was administered both down the vein graft and into the aortic root.  With the aortic crossclamp still in place, a single punch aortotomy was performed and the vein graft was anastomosed to the ascending aorta.  Air was evacuated from the ascending aortic graft and aortic crossclamp was removed.  Total crossclamp time was 45 minutes.  Sites anastomosed were inspected and free of bleeding.  The patient was then ventilated and weaned cardiopulmonary bypass without difficulty.  She remained hemodynamically stable.  She was decannulated in usual fashion.  Protamine sulfate was administered with operative field hemostatic.  Atrial and ventricular pacing wires were applied. Graft marker was applied.  The left pleural tube was left in place.  A Blake mediastinal drain was left in place.  Pericardium was loosely reapproximated.  Sternum was closed with #6 stainless steel wire. Fascia was closed using 2-0 Vicryl, running 3-0 Vicryl, subcutaneous tissue, and 4-0 subcuticular stitch in skin edges.  Dry dressings were applied.  Sponge and needle counts reported as correct at completion of procedure.  The patient tolerated the procedure without obvious complication and was transferred to Surgical Intensive Care Unit for further postop care.  Total clamp time was 71 minute.  No blood bank blood products were used.  Addendum:  A large incidental lymph node was noted along the mammary chain and this was removed and submitted to Pathology, as incidental left mammary lymph node for pathologic examination.     Sheliah Plane, MD     EG/MEDQ  D:  11/28/2011  T:  11/28/2011  Job:  [id number]  cc:   Jake Bathe, MD

## 2011-11-28 NOTE — Progress Notes (Signed)
Patient ID: Denise Cuevas, female   DOB: 1951-02-12, 60 y.o.   MRN: [id number] TCTS DAILY PROGRESS NOTE                   [address]            [place] [address]          [phone]      1 Day Post-Op Procedure(s) (LRB): CORONARY ARTERY BYPASS GRAFTING (CABG) (N/A)  Total Length of Stay:  LOS: 3 days   Subjective: Patient is extubated neurologically intact with some lower chest soreness but otherwise without complaints  Objective: Vital signs in last 24 hours: Patient Vitals for the past 24 hrs:  BP Temp Temp src Pulse Resp SpO2 Height Weight  11/28/11 0700 - 100.6 F (38.1 C) - 90  23  96 % - -  11/28/11 0605 - - - 89  21  94 % - -  11/28/11 0600 - 100.4 F (38 C) - 89  21  95 % - -  11/28/11 0522 - - - 89  25  94 % - -  11/28/11 0500 - 100.2 F (37.9 C) - 89  19  93 % - 177 lb 7.5 oz (80.5 kg)  11/28/11 0425 - - - 89  17  94 % - -  11/28/11 0400 - 99.5 F (37.5 C) - 90  17  95 % - -  11/28/11 0322 - - - 90  24  96 % - -  11/28/11 0300 - 99.3 F (37.4 C) - 90  15  94 % - -  11/28/11 0200 - 99 F (37.2 C) - 90  12  94 % - -  11/28/11 0100 - 98.8 F (37.1 C) - 90  13  94 % - -  11/28/11 0021 - - - 90  16  93 % - -  11/28/11 0000 - 98.6 F (37 C) - 90  15  94 % - -  11/27/11 2309 - - - 89  17  94 % - -  11/27/11 2300 - 98.6 F (37 C) - 90  15  93 % - -  11/27/11 2247 - - - 90  15  93 % - -  11/27/11 2200 - 98.2 F (36.8 C) - 90  16  95 % - -  11/27/11 2130 - - - 90  13  94 % - -  11/27/11 2115 - - - 90  13  94 % - -  11/27/11 2100 - 98.1 F (36.7 C) - 90  19  94 % - -  11/27/11 2045 - - - 90  17  95 % - -  11/27/11 2030 - - - 90  19  95 % - -  11/27/11 2015 - 97.9 F (36.6 C) - 90  13  94 % - -  11/27/11 2000 - 97.7 F (36.5 C) - 90  16  95 % - -  11/27/11 1945 - - - 89  13  96 % - -  11/27/11 1930 - - - 90  13  94 % - -  11/27/11 1915 - - - 90  16  94 % - -  11/27/11 1900 - 97.5 F (36.4 C) Core 90  16  95 % - -  11/27/11 1845 -  97.5 F (36.4 C) Core 90  13  94 % - -  11/27/11 1830 - 97.7 F (36.5 C) Core 89  16  96 % - -  11/27/11 1815 - 97.3 F (36.3 C) Core 90  15  93 % - -  11/27/11 1800 - 97.3 F (36.3 C) Core 90  17  94 % - -  11/27/11 1756 - 97.5 F (36.4 C) Core 90  16  96 % - -  11/27/11 1745 - 97.5 F (36.4 C) Core 90  12  95 % - -  11/27/11 1730 - 97.5 F (36.4 C) Core 89  21  98 % - -  11/27/11 1715 - 97.9 F (36.6 C) Core 90  10  97 % - -  11/27/11 1700 - 97.7 F (36.5 C) Core 89  23  97 % - -  11/27/11 1645 140/83 mmHg 97.9 F (36.6 C) Core 90  11  96 % - -  11/27/11 1630 - 98.1 F (36.7 C) Core 90  12  96 % - -  11/27/11 1615 - 98.1 F (36.7 C) Core 90  12  97 % - -  11/27/11 1600 - 97.9 F (36.6 C) Core 90  24  97 % - -  11/27/11 1545 - 97.9 F (36.6 C) Core 89  30  97 % - -  11/27/11 1530 - 97.7 F (36.5 C) Core 90  12  98 % - -  11/27/11 1515 - 97.7 F (36.5 C) Core 90  12  98 % - -  11/27/11 1500 - 97.7 F (36.5 C) Core 90  12  97 % - -  11/27/11 1445 - 97.9 F (36.6 C) Core 90  12  97 % - -  11/27/11 1430 - 97.9 F (36.6 C) Core 90  12  96 % - -  11/27/11 1415 - 97.9 F (36.6 C) Core 90  12  96 % - -  11/27/11 1400 - 97.7 F (36.5 C) Core 90  12  97 % 5\' 2"  (1.575 m) 166 lb 10.7 oz (75.6 kg)  11/27/11 1345 - - - 90  1  97 % - -  11/27/11 1340 - - - 90  12  96 % - -  11/27/11 1333 100/59 mmHg - - 90  12  96 % - -   Wt Readings from Last 3 Encounters:  11/28/11 177 lb 7.5 oz (80.5 kg)  11/28/11 177 lb 7.5 oz (80.5 kg)  11/19/11 166 lb (75.297 kg)    Hemodynamic parameters for last 24 hours: PAP: (19-51)/(9-32) 51/27 mmHg CO:  [3.4 L/min-5 L/min] 5 L/min CI:  [1.9 L/min/m2-2.8 L/min/m2] 2.8 L/min/m2  Intake/Output from previous day: 12/19 0701 - 12/20 0700 In: 6660.5 [I.V.:5472.5; Blood:458; NG/GT:150; IV Piggyback:580] Out: 5720 [Urine:2540; Blood:2750; Chest Tube:430]  Intake/Output this shift:    Current Meds: Scheduled Meds:   . acetaminophen (TYLENOL)  oral liquid 160 mg/5 mL  650 mg Per Tube NOW   Or  . acetaminophen  650 mg Rectal NOW  . acetaminophen  1,000 mg Oral Q6H   Or  . acetaminophen (TYLENOL) oral liquid 160 mg/5 mL  975 mg Per Tube Q6H  . aspirin EC  325 mg Oral Daily   Or  . aspirin  324 mg Per Tube Daily  . bisacodyl  10 mg Oral Daily   Or  . bisacodyl  10 mg Rectal Daily  . cefUROXime (ZINACEF)  IV  1.5 g Intravenous To OR  . cefUROXime (ZINACEF)  IV  1.5 g Intravenous Q12H  . docusate sodium  200 mg Oral Daily  .  famotidine (PEPCID) IV  20 mg Intravenous Q12H  . heparin-papaverine-plasmalyte irrigation   Irrigation To OR  . levothyroxine  125 mcg Oral QAC breakfast  . magnesium sulfate  4 g Intravenous Once  . metoprolol tartrate  12.5 mg Oral BID   Or  . metoprolol tartrate  12.5 mg Per Tube BID  . pantoprazole  40 mg Oral Q1200  . phenylephrine (NEO-SYNEPHRINE) Adult infusion  30-200 mcg/min Intravenous To OR  . potassium chloride  10 mEq Intravenous Q1 Hr x 3  . sodium chloride  3 mL Intravenous Q12H  . vancomycin (VANCOCIN) IVPB 1000 mg/100 mL central line  1,000 mg Intravenous Once  . DISCONTD: aminocaproic acid (AMICAR) for OHS   Intravenous To OR  . DISCONTD: aminocaproic acid (AMICAR) IVPB  5 g Intravenous To OR  . DISCONTD: aminocaproic acid (AMICAR) IVPB  5 g Intravenous To OR  . DISCONTD: aspirin EC  81 mg Oral Daily  . DISCONTD: atenolol  25 mg Oral Daily  . DISCONTD: cefUROXime (ZINACEF)  IV  1.5 g Intravenous Q12H  . DISCONTD: cefUROXime (ZINACEF)  IV  750 mg Intravenous To OR  . DISCONTD: DOPamine  2-20 mcg/kg/min Intravenous To OR  . DISCONTD: DULoxetine  90 mg Oral Daily  . DISCONTD: epinephrine  0.5-20 mcg/min Intravenous To OR  . DISCONTD: insulin aspart  0-9 Units Subcutaneous TID WC  . DISCONTD: insulin glargine  24 Units Subcutaneous Daily  . DISCONTD: levothyroxine  125 mcg Oral QAC breakfast  . DISCONTD: magnesium sulfate  40 mEq Other To OR  . DISCONTD: metFORMIN  1,500 mg Oral Q  breakfast  . DISCONTD: pantoprazole  40 mg Oral Q1200  . DISCONTD: potassium chloride  80 mEq Other To OR  . DISCONTD: simvastatin  10 mg Oral q1800  . DISCONTD: sodium chloride  3 mL Intravenous Q12H  . DISCONTD: Vitamin D (Ergocalciferol)  50,000 Units Oral Weekly   Continuous Infusions:   . sodium chloride 20 mL/hr at 11/27/11 1407  . sodium chloride    . sodium chloride    . dexmedetomidine (PRECEDEX) IV infusion Stopped (11/27/11 1600)  . insulin (NOVOLIN-R) infusion 2.1 Units/hr (11/28/11 0800)  . lactated ringers 40 mL/hr at 11/27/11 1500  . nitroGLYCERIN 5 mcg/min (11/27/11 1900)  . phenylephrine (NEO-SYNEPHRINE) Adult infusion Stopped (11/27/11 1700)  . DISCONTD: eptifibatide 2 mcg/kg/min (11/27/11 0200)  . DISCONTD: heparin Stopped (11/27/11 0700)  . DISCONTD: nitroGLYCERIN 5 mcg/min (11/27/11 0200)   PRN Meds:.albumin human, lactated ringers, metoprolol, midazolam, morphine injection, morphine, ondansetron (ZOFRAN) IV, sodium chloride, traMADol, DISCONTD: sodium chloride, DISCONTD: acetaminophen, DISCONTD: ALPRAZolam, DISCONTD: budesonide-formoterol, DISCONTD: hemostatic agents, DISCONTD: nitroGLYCERIN, DISCONTD: papaverine, DISCONTD: sodium chloride, DISCONTD: Surgifoam 1 Gm with 0.9% sodium chloride (4 ml) topical solution  General appearance: alert, cooperative and no distress Neurologic: intact Heart: regular rate and rhythm, S1, S2 normal, no murmur, click, rub or gallop and normal apical impulse Lungs: diminished breath sounds bibasilar and bilaterally Abdomen: soft, non-tender; bowel sounds normal; no masses,  no organomegaly Extremities: extremities normal, atraumatic, no cyanosis or edema, Homans sign is negative, no sign of DVT and no edema, redness or tenderness in the calves or thighs  Lab Results: CBC: Basename 11/28/11 0400 11/27/11 1948  WBC 16.2* 10.3  HGB 13.3 12.8  HCT 37.9 36.3  PLT 115* 89*   BMET:  Basename 11/28/11 0400 11/27/11 1948 11/27/11  1947 11/27/11 0537  NA 137 -- 139 --  K 4.1 -- 4.1 --  CL 106 -- 105 --  CO2 23 -- -- 29  GLUCOSE 120* -- 123* --  BUN 9 -- 6 --  CREATININE 0.53 0.53 -- --  CALCIUM 8.4 -- -- 9.1    PT/INR:  Basename 11/27/11 1345  LABPROT 16.6*  INR 1.32   Radiology: Dg Chest Portable 1 View In Am  11/28/2011  *RADIOLOGY REPORT*  Clinical Data: Postop heart surgery.  PORTABLE CHEST - 1 VIEW  Comparison: 11/27/2011.  Findings: Trachea is midline.  Heart size stable.  Right IJ Swan- Ganz catheter tip projects over the main pulmonary artery or proximal right pulmonary artery.  Mediastinal drain and left chest tube remain in place, as do epicardial pacer wires.  Sternotomy wires are unchanged in position.  There is bibasilar air space disease with low lung volumes.  Overall aeration has improved somewhat in the interval.  No pneumothorax.  IMPRESSION: Low lung volumes with persistent bibasilar air space disease. Overall aeration has improved slightly in the interval.  No pneumothorax.  Original Report Authenticated By: Reyes Ivan, M.D.   Dg Chest Portable 1 View  11/27/2011  *RADIOLOGY REPORT*  Clinical Data: Postop open heart surgery  PORTABLE CHEST - 1 VIEW  Comparison: Portable chest x-ray of 11/25/2011  Findings: Median sternotomy has been performed.  The lungs are poorly aerated with haziness bilaterally consistent with atelectasis and probably effusions.  The endotracheal tip is approximately 1.9 cm above the carina.  A left chest tube is present and no pneumothorax is seen.  Heart size is stable.  Theone Murdoch catheter is noted with the tip in the pulmonary outflow tract.  IMPRESSION:  1.  Poor aeration with basilar atelectasis and  probable effusions. 2.  Endotracheal tip only 1.9 cm above the carina. 3.  Left chest tube.  No definite pneumothorax.  Original Report Authenticated By: Juline Patch, M.D.     Assessment/Plan: S/P Procedure(s) (LRB): CORONARY ARTERY BYPASS GRAFTING (CABG)  (N/A) Mobilize Diuresis Diabetes control d/c tubes/lines See progression orders Pain control Preop Thrombocytopenia while on heparin, PLTS now 115 no blood products in the OR AVOID HEPARIN for now    Delight Ovens MD  Beeper [phone] Office [phone] 11/28/2011 9:39 AM

## 2011-11-29 ENCOUNTER — Inpatient Hospital Stay (HOSPITAL_COMMUNITY): Payer: BC Managed Care – PPO

## 2011-11-29 LAB — BASIC METABOLIC PANEL
BUN: 14 mg/dL (ref 6–23)
CO2: 28 mEq/L (ref 19–32)
Calcium: 8.8 mg/dL (ref 8.4–10.5)
Chloride: 101 mEq/L (ref 96–112)
Creatinine, Ser: 0.56 mg/dL (ref 0.50–1.10)
GFR calc Af Amer: >90 mL/min (ref >90)
GFR calc non Af Amer: >90 mL/min (ref >90)
Glucose, Bld: 197 mg/dL — ABNORMAL HIGH (ref 70–99)
Potassium: 4.4 mEq/L (ref 3.5–5.1)
Sodium: 135 mEq/L (ref 135–145)

## 2011-11-29 LAB — HEPATIC FUNCTION PANEL
ALT: 27 U/L (ref 0–35)
AST: 31 U/L (ref 0–37)
Albumin: 2.5 g/dL — ABNORMAL LOW (ref 3.5–5.2)
Alkaline Phosphatase: 106 U/L (ref 39–117)
Bilirubin, Direct: 1.4 mg/dL — ABNORMAL HIGH (ref 0.0–0.3)
Indirect Bilirubin: 1.2 mg/dL — ABNORMAL HIGH (ref 0.3–0.9)
Total Bilirubin: 2.6 mg/dL — ABNORMAL HIGH (ref 0.3–1.2)
Total Protein: 5.4 g/dL — ABNORMAL LOW (ref 6.0–8.3)

## 2011-11-29 LAB — CBC
HCT: 37.6 % (ref 36.0–46.0)
Hemoglobin: 13.3 g/dL (ref 12.0–15.0)
MCH: 31.1 pg (ref 26.0–34.0)
MCHC: 35.4 g/dL (ref 30.0–36.0)
MCV: 88.1 fL (ref 78.0–100.0)
Platelets: 122 10*3/uL — ABNORMAL LOW (ref 150–400)
RBC: 4.27 MIL/uL (ref 3.87–5.11)
RDW: 13.9 % (ref 11.5–15.5)
WBC: 17 10*3/uL — ABNORMAL HIGH (ref 4.0–10.5)

## 2011-11-29 LAB — GLUCOSE, CAPILLARY
Glucose-Capillary: 144 mg/dL — ABNORMAL HIGH (ref 70–99)
Glucose-Capillary: 138 mg/dL — ABNORMAL HIGH (ref 70–99)
Glucose-Capillary: 167 mg/dL — ABNORMAL HIGH (ref 70–99)
Glucose-Capillary: 147 mg/dL — ABNORMAL HIGH (ref 70–99)
Glucose-Capillary: 151 mg/dL — ABNORMAL HIGH (ref 70–99)

## 2011-11-29 LAB — TSH: TSH: 3.747 u[IU]/mL (ref 0.350–4.500)

## 2011-11-29 MED ORDER — GUAIFENESIN ER 600 MG PO TB12
600.0000 mg | ORAL_TABLET | Freq: Two times a day (BID) | ORAL | Status: DC | PRN
  Filled 2011-11-29: qty 1

## 2011-11-29 MED ORDER — MOVING RIGHT ALONG BOOK
Freq: Once | Status: AC
  Administered 2011-11-29: 08:00:00
  Filled 2011-11-29: qty 1

## 2011-11-29 MED ORDER — BUDESONIDE-FORMOTEROL FUMARATE 160-4.5 MCG/ACT IN AERO
2.0000 | INHALATION_SPRAY | Freq: Two times a day (BID) | RESPIRATORY_TRACT | Status: DC | PRN
  Filled 2011-11-29: qty 6

## 2011-11-29 MED ORDER — PANTOPRAZOLE SODIUM 40 MG PO TBEC
40.0000 mg | DELAYED_RELEASE_TABLET | Freq: Every day | ORAL | Status: DC
  Administered 2011-11-29 – 2011-12-02 (×4): 40 mg via ORAL
  Filled 2011-11-29 (×4): qty 1

## 2011-11-29 MED ORDER — INSULIN GLARGINE 100 UNIT/ML ~~LOC~~ SOLN
30.0000 [IU] | Freq: Two times a day (BID) | SUBCUTANEOUS | Status: DC
  Administered 2011-11-29 – 2011-12-01 (×6): 30 [IU] via SUBCUTANEOUS

## 2011-11-29 MED ORDER — DOCUSATE SODIUM 100 MG PO CAPS
200.0000 mg | ORAL_CAPSULE | Freq: Every day | ORAL | Status: DC
  Administered 2011-11-29 – 2011-12-02 (×4): 200 mg via ORAL
  Filled 2011-11-29 (×4): qty 2

## 2011-11-29 MED ORDER — VITAMIN D (ERGOCALCIFEROL) 1.25 MG (50000 UNIT) PO CAPS
50000.0000 [IU] | ORAL_CAPSULE | ORAL | Status: DC
  Administered 2011-11-29: 50000 [IU] via ORAL
  Filled 2011-11-29: qty 1

## 2011-11-29 MED ORDER — BISACODYL 5 MG PO TBEC: 10.0000 mg | DELAYED_RELEASE_TABLET | Freq: Every day | ORAL | Status: DC | PRN

## 2011-11-29 MED ORDER — POTASSIUM CHLORIDE CRYS ER 20 MEQ PO TBCR
20.0000 meq | EXTENDED_RELEASE_TABLET | Freq: Every day | ORAL | Status: AC
  Administered 2011-11-29 – 2011-12-01 (×3): 20 meq via ORAL
  Filled 2011-11-29 (×3): qty 1

## 2011-11-29 MED ORDER — TRAMADOL HCL 50 MG PO TABS
50.0000 mg | ORAL_TABLET | ORAL | Status: DC | PRN
  Administered 2011-12-02: 100 mg via ORAL
  Filled 2011-11-29: qty 2

## 2011-11-29 MED ORDER — BISACODYL 10 MG RE SUPP: 10.0000 mg | Freq: Every day | RECTAL | Status: DC | PRN

## 2011-11-29 MED ORDER — SODIUM CHLORIDE 0.9 % IJ SOLN
3.0000 mL | Freq: Two times a day (BID) | INTRAMUSCULAR | Status: DC
  Administered 2011-11-29 – 2011-12-01 (×5): 3 mL via INTRAVENOUS

## 2011-11-29 MED ORDER — INSULIN ASPART 100 UNIT/ML ~~LOC~~ SOLN
0.0000 [IU] | Freq: Three times a day (TID) | SUBCUTANEOUS | Status: DC
  Administered 2011-11-29: 4 [IU] via SUBCUTANEOUS
  Administered 2011-11-29 – 2011-11-30 (×4): 2 [IU] via SUBCUTANEOUS
  Administered 2011-11-30: 4 [IU] via SUBCUTANEOUS
  Administered 2011-11-30 – 2011-12-01 (×4): 2 [IU] via SUBCUTANEOUS

## 2011-11-29 MED ORDER — ASPIRIN EC 325 MG PO TBEC
325.0000 mg | DELAYED_RELEASE_TABLET | Freq: Every day | ORAL | Status: DC
  Administered 2011-11-29 – 2011-12-02 (×4): 325 mg via ORAL
  Filled 2011-11-29 (×4): qty 1

## 2011-11-29 MED ORDER — AMIODARONE HCL 200 MG PO TABS
400.0000 mg | ORAL_TABLET | Freq: Two times a day (BID) | ORAL | Status: DC
  Administered 2011-11-29 – 2011-12-02 (×7): 400 mg via ORAL
  Filled 2011-11-29 (×9): qty 2

## 2011-11-29 MED ORDER — MAGNESIUM HYDROXIDE 400 MG/5ML PO SUSP: 30.0000 mL | Freq: Every day | ORAL | Status: DC | PRN

## 2011-11-29 MED ORDER — DULOXETINE HCL 60 MG PO CPEP
90.0000 mg | ORAL_CAPSULE | Freq: Every day | ORAL | Status: DC
  Administered 2011-11-29 – 2011-12-02 (×4): 90 mg via ORAL
  Filled 2011-11-29 (×4): qty 1

## 2011-11-29 MED ORDER — SODIUM CHLORIDE 0.9 % IJ SOLN
3.0000 mL | INTRAMUSCULAR | Status: DC | PRN
  Administered 2011-11-29: 3 mL via INTRAVENOUS

## 2011-11-29 MED ORDER — SIMVASTATIN 10 MG PO TABS
10.0000 mg | ORAL_TABLET | Freq: Every day | ORAL | Status: DC
  Administered 2011-11-29 – 2011-12-01 (×3): 10 mg via ORAL
  Filled 2011-11-29 (×4): qty 1

## 2011-11-29 MED ORDER — METOPROLOL TARTRATE 12.5 MG HALF TABLET
12.5000 mg | ORAL_TABLET | Freq: Two times a day (BID) | ORAL | Status: DC
  Administered 2011-11-29 – 2011-11-30 (×3): 12.5 mg via ORAL
  Filled 2011-11-29 (×4): qty 1

## 2011-11-29 MED ORDER — METFORMIN HCL ER 750 MG PO TB24
750.0000 mg | ORAL_TABLET | Freq: Two times a day (BID) | ORAL | Status: DC
  Administered 2011-11-29 – 2011-12-02 (×7): 750 mg via ORAL
  Filled 2011-11-29 (×9): qty 1

## 2011-11-29 MED ORDER — AMIODARONE HCL 200 MG PO TABS: 400.0000 mg | ORAL_TABLET | Freq: Every day | ORAL | Status: DC

## 2011-11-29 MED ORDER — OXYCODONE HCL 5 MG PO TABS
5.0000 mg | ORAL_TABLET | ORAL | Status: DC | PRN
  Administered 2011-11-29 (×2): 5 mg via ORAL
  Administered 2011-11-30 (×3): 10 mg via ORAL
  Administered 2011-11-30: 5 mg via ORAL
  Filled 2011-11-29: qty 1
  Filled 2011-11-29 (×4): qty 2
  Filled 2011-11-29: qty 1

## 2011-11-29 MED ORDER — FUROSEMIDE 40 MG PO TABS
40.0000 mg | ORAL_TABLET | Freq: Every day | ORAL | Status: AC
  Administered 2011-11-29 – 2011-12-01 (×3): 40 mg via ORAL
  Filled 2011-11-29 (×3): qty 1

## 2011-11-29 MED ORDER — POVIDONE-IODINE 10 % EX SOLN
1.0000 "application " | Freq: Two times a day (BID) | CUTANEOUS | Status: DC
  Administered 2011-11-29 – 2011-12-02 (×6): 1 via TOPICAL
  Filled 2011-11-29: qty 15

## 2011-11-29 MED ORDER — SODIUM CHLORIDE 0.9 % IV SOLN: 250.0000 mL | INTRAVENOUS | Status: DC | PRN

## 2011-11-29 MED ORDER — ACETAMINOPHEN 325 MG PO TABS: 650.0000 mg | ORAL_TABLET | Freq: Four times a day (QID) | ORAL | Status: DC | PRN

## 2011-11-29 MED FILL — Electrolyte-R (PH 7.4) Solution: INTRAVENOUS | Qty: 4000 | Status: AC

## 2011-11-29 MED FILL — Heparin Sodium (Porcine) Inj 1000 Unit/ML: INTRAMUSCULAR | Qty: 60 | Status: AC

## 2011-11-29 MED FILL — Sodium Chloride IV Soln 0.9%: INTRAVENOUS | Qty: 1000 | Status: AC

## 2011-11-29 MED FILL — Sodium Chloride Irrigation Soln 0.9%: Qty: 3000 | Status: AC

## 2011-11-29 NOTE — Progress Notes (Signed)
Patient ID: Denise Cuevas, female   DOB: Nov 08, 1951, 60 y.o.   MRN: [id number] TCTS DAILY PROGRESS NOTE                   [address]            [place] [address]          [phone]      2 Days Post-Op Procedure(s) (LRB): CORONARY ARTERY BYPASS GRAFTING (CABG) (N/A)  Total Length of Stay:  LOS: 4 days   Subjective: A fib last night walked around unit   Objective: Vital signs in last 24 hours: Patient Vitals for the past 24 hrs:  BP Temp Temp src Pulse Resp SpO2 Weight  11/29/11 0700 - - - - - - 176 lb 5.9 oz (80 kg)  11/29/11 0400 87/59 mmHg - - - 24  - -  11/29/11 0333 - 97.5 F (36.4 C) Oral - - - -  11/29/11 0300 83/59 mmHg - - 64  19  100 % -  11/29/11 0200 100/82 mmHg - - - 20  - -  11/29/11 0100 105/77 mmHg - - 32  17  79 % -  11/29/11 0000 97/74 mmHg 99.2 F (37.3 C) Oral - 26  - -  11/28/11 2300 108/69 mmHg - - 109  25  90 % -  11/28/11 2200 98/53 mmHg - - 81  18  92 % -  11/28/11 2100 97/46 mmHg - - 80  20  91 % -  11/28/11 2000 110/59 mmHg - - 81  24  91 % -  11/28/11 1917 - 97.9 F (36.6 C) Oral - - - -  11/28/11 1900 98/60 mmHg - - 78  18  93 % -  11/28/11 1800 101/66 mmHg - - 75  20  94 % -  11/28/11 1700 107/66 mmHg - - 77  28  95 % -  11/28/11 1600 108/67 mmHg - - 82  17  94 % -  11/28/11 1547 - 97.8 F (36.6 C) Oral - - - -  11/28/11 1500 124/69 mmHg - - 84  15  95 % -  11/28/11 1400 131/61 mmHg - - 87  23  97 % -  11/28/11 1300 105/62 mmHg - - 88  19  94 % -  11/28/11 1200 - 100.4 F (38 C) - 92  25  95 % -  11/28/11 1100 - 100.4 F (38 C) - 90  30  95 % -  11/28/11 1000 - 100.4 F (38 C) - 92  28  95 % -  11/28/11 0900 - 100.6 F (38.1 C) - 91  28  96 % -  11/28/11 0800 - 100.9 F (38.3 C) Core 91  26  95 % -   Wt Readings from Last 3 Encounters:  11/29/11 176 lb 5.9 oz (80 kg)  11/29/11 176 lb 5.9 oz (80 kg)  11/19/11 166 lb (75.297 kg)    Hemodynamic parameters for last 24 hours: PAP: (46-49)/(21-24) 46/21  mmHg  Intake/Output from previous day: 12/20 0701 - 12/21 0700 In: 898.6 [P.O.:120; I.V.:626.6; IV Piggyback:152] Out: 1530 [Urine:1450; Chest Tube:80]  Intake/Output this shift:    Current Meds: Scheduled Meds:   . acetaminophen  1,000 mg Oral Q6H   Or  . acetaminophen (TYLENOL) oral liquid 160 mg/5 mL  975 mg Per Tube Q6H  . amiodarone  150 mg Intravenous Once  . aspirin EC  325 mg Oral  Daily   Or  . aspirin  324 mg Per Tube Daily  . bisacodyl  10 mg Oral Daily   Or  . bisacodyl  10 mg Rectal Daily  . cefUROXime (ZINACEF)  IV  1.5 g Intravenous Q12H  . docusate sodium  200 mg Oral Daily  . famotidine (PEPCID) IV  20 mg Intravenous Q12H  . furosemide  20 mg Intravenous Q12H  . furosemide  20 mg Intravenous Once  . insulin aspart  0-24 Units Subcutaneous Q4H  . insulin glargine  20 Units Subcutaneous BID  . levothyroxine  125 mcg Oral QAC breakfast  . LORazepam  1 mg Intravenous Once  . metoprolol tartrate  12.5 mg Oral BID   Or  . metoprolol tartrate  12.5 mg Per Tube BID  . pantoprazole  40 mg Oral Q1200  . sodium chloride  3 mL Intravenous Q12H  . DISCONTD: insulin glargine  20 Units Subcutaneous Q0700   Continuous Infusions:   . sodium chloride 20 mL/hr at 11/27/11 1407  . sodium chloride    . sodium chloride 250 mL (11/29/11 0600)  . amiodarone (CORDARONE) infusion 60 mg/hr (11/29/11 0400)   And  . amiodarone (CORDARONE) infusion 30 mg/hr (11/29/11 0600)  . dexmedetomidine (PRECEDEX) IV infusion Stopped (11/27/11 1600)  . insulin (NOVOLIN-R) infusion 7.6 Units/hr (11/29/11 0545)  . lactated ringers 20 mL/hr at 11/29/11 0600  . nitroGLYCERIN 5 mcg/min (11/27/11 1900)  . phenylephrine (NEO-SYNEPHRINE) Adult infusion Stopped (11/27/11 1700)   PRN Meds:.albumin human, metoprolol, midazolam, morphine injection, ondansetron (ZOFRAN) IV, oxyCODONE, sodium chloride, traMADol, DISCONTD: morphine  General appearance: alert, cooperative, no distress and moderately  obese Neurologic: intact Heart: regular rate and rhythm, S1, S2 normal, no murmur, click, rub or gallop Lungs: clear to auscultation bilaterally Abdomen: soft, non-tender; bowel sounds normal; no masses,  no organomegaly Extremities: extremities normal, atraumatic, no cyanosis or edema and Homans sign is negative, no sign of DVT Wound: sternum stable  Lab Results: CBC: Basename 11/29/11 0400 11/28/11 1700  WBC 17.0* 22.5*  HGB 13.3 13.8  HCT 37.6 39.3  PLT 122* 138*   BMET:  Basename 11/29/11 0400 11/28/11 1700 11/28/11 1658 11/28/11 0400  NA 135 -- 137 --  K 4.4 -- 4.2 --  CL 101 -- 102 --  CO2 28 -- -- 23  GLUCOSE 197* -- 195* --  BUN 14 -- 12 --  CREATININE 0.56 0.60 -- --  CALCIUM 8.8 -- -- 8.4    PT/INR:  Basename 11/27/11 1345  LABPROT 16.6*  INR 1.32   Radiology: Dg Chest Portable 1 View In Am  11/28/2011  *RADIOLOGY REPORT*  Clinical Data: Postop heart surgery.  PORTABLE CHEST - 1 VIEW  Comparison: 11/27/2011.  Findings: Trachea is midline.  Heart size stable.  Right IJ Swan- Ganz catheter tip projects over the main pulmonary artery or proximal right pulmonary artery.  Mediastinal drain and left chest tube remain in place, as do epicardial pacer wires.  Sternotomy wires are unchanged in position.  There is bibasilar air space disease with low lung volumes.  Overall aeration has improved somewhat in the interval.  No pneumothorax.  IMPRESSION: Low lung volumes with persistent bibasilar air space disease. Overall aeration has improved slightly in the interval.  No pneumothorax.  Original Report Authenticated By: Reyes Ivan, M.D.   Dg Chest Portable 1 View  11/27/2011  *RADIOLOGY REPORT*  Clinical Data: Postop open heart surgery  PORTABLE CHEST - 1 VIEW  Comparison: Portable chest x-ray of 11/25/2011  Findings: Median sternotomy has been performed.  The lungs are poorly aerated with haziness bilaterally consistent with atelectasis and probably effusions.  The  endotracheal tip is approximately 1.9 cm above the carina.  A left chest tube is present and no pneumothorax is seen.  Heart size is stable.  Theone Murdoch catheter is noted with the tip in the pulmonary outflow tract.  IMPRESSION:  1.  Poor aeration with basilar atelectasis and  probable effusions. 2.  Endotracheal tip only 1.9 cm above the carina. 3.  Left chest tube.  No definite pneumothorax.  Original Report Authenticated By: Juline Patch, M.D.     Assessment/Plan: S/P Procedure(s) (LRB): CORONARY ARTERY BYPASS GRAFTING (CABG) (N/A) Mobilize Diuresis back on insulin drip also getting increased glucose with Cordarone drip Diabetes control Plan for transfer to step-down: see transfer orders Episode of A fib now sinus    Delight Ovens MD  Beeper [phone] Office [phone] 11/29/2011 7:30 AM

## 2011-11-29 NOTE — Progress Notes (Signed)
Inpatient Diabetes Program Recommendations  AACE/ADA: New Consensus Statement on Inpatient Glycemic Control (2009)  Target Ranges:  Prepandial:   less than 140 mg/dL      Peak postprandial:   less than 180 mg/dL (1-2 hours)      Critically ill patients:  140 - 180 mg/dL   Reason for Visit: Hyperglycemia  Inpatient Diabetes Program Recommendations Outpatient Referral: OP Diabetes Education Consult for HgbA1C > 7.  Note: CBGs 185, 212, 167, 147

## 2011-11-30 ENCOUNTER — Inpatient Hospital Stay (HOSPITAL_COMMUNITY): Payer: BC Managed Care – PPO

## 2011-11-30 ENCOUNTER — Other Ambulatory Visit: Payer: Self-pay

## 2011-11-30 LAB — GLUCOSE, CAPILLARY
Glucose-Capillary: 130 mg/dL — ABNORMAL HIGH (ref 70–99)
Glucose-Capillary: 146 mg/dL — ABNORMAL HIGH (ref 70–99)

## 2011-11-30 LAB — BASIC METABOLIC PANEL
BUN: 17 mg/dL (ref 6–23)
CO2: 27 mEq/L (ref 19–32)
Calcium: 8.9 mg/dL (ref 8.4–10.5)
Chloride: 97 mEq/L (ref 96–112)
Creatinine, Ser: 0.64 mg/dL (ref 0.50–1.10)
GFR calc Af Amer: >90 mL/min (ref >90)
GFR calc non Af Amer: >90 mL/min (ref >90)
Glucose, Bld: 160 mg/dL — ABNORMAL HIGH (ref 70–99)
Potassium: 4.6 mEq/L (ref 3.5–5.1)
Sodium: 131 mEq/L — ABNORMAL LOW (ref 135–145)

## 2011-11-30 LAB — CBC
HCT: 34.3 % — ABNORMAL LOW (ref 36.0–46.0)
Hemoglobin: 12.2 g/dL (ref 12.0–15.0)
MCH: 31.2 pg (ref 26.0–34.0)
MCHC: 35.6 g/dL (ref 30.0–36.0)
MCV: 87.7 fL (ref 78.0–100.0)
Platelets: 132 10*3/uL — ABNORMAL LOW (ref 150–400)
RBC: 3.91 MIL/uL (ref 3.87–5.11)
RDW: 14 % (ref 11.5–15.5)
WBC: 14.3 10*3/uL — ABNORMAL HIGH (ref 4.0–10.5)

## 2011-11-30 LAB — MAGNESIUM: Magnesium: 1.9 mg/dL (ref 1.5–2.5)

## 2011-11-30 MED ORDER — METOPROLOL TARTRATE 1 MG/ML IV SOLN
INTRAVENOUS | Status: AC
  Filled 2011-11-30: qty 5

## 2011-11-30 MED ORDER — METOPROLOL TARTRATE 1 MG/ML IV SOLN
2.5000 mg | Freq: Once | INTRAVENOUS | Status: AC
  Administered 2011-11-30: 2.5 mg via INTRAVENOUS

## 2011-11-30 MED ORDER — METOPROLOL TARTRATE 25 MG PO TABS
25.0000 mg | ORAL_TABLET | Freq: Two times a day (BID) | ORAL | Status: DC
  Administered 2011-11-30 – 2011-12-02 (×5): 25 mg via ORAL
  Filled 2011-11-30 (×7): qty 1

## 2011-11-30 NOTE — Progress Notes (Addendum)
[address]            [place] [address]          [phone]     3 Days Post-Op  Procedure(s) (LRB): CORONARY ARTERY BYPASS GRAFTING (CABG) (N/A) Subjective: Feeling better each day, slept well, ambulation improving.  Objective  Telemetry NSR  Temp:  [97.7 F (36.5 C)-98.2 F (36.8 C)] 97.7 F (36.5 C) (12/22 0455) Pulse Rate:  [78-90] 82  (12/22 0455) Resp:  [20-30] 20  (12/22 0455) BP: (102-128)/(58-71) 112/71 mmHg (12/22 0455) SpO2:  [94 %-100 %] 96 % (12/22 0455) Weight:  [175 lb 9.6 oz (79.652 kg)] 175 lb 9.6 oz (79.652 kg) (12/22 0455)   Intake/Output Summary (Last 24 hours) at 11/30/11 9147 Last data filed at 11/30/11 0412  Gross per 24 hour  Intake    710 ml  Output    951 ml  Net   -241 ml       General appearance: alert, cooperative and no distress Heart: regular rate and rhythm, S1, S2 normal, no murmur, click, rub or gallop Lungs: diminished in the bases Abdomen: soft, non-tender; bowel sounds normal; no masses,  no organomegaly Extremities: no edema Wound: incisions healing well without signs of infection  Lab Results:  Basename 11/30/11 0530 11/29/11 0400 11/28/11 1700  NA 131* 135 --  K 4.6 4.4 --  CL 97 101 --  CO2 27 28 --  GLUCOSE 160* 197* --  BUN 17 14 --  CREATININE 0.64 0.56 --  CALCIUM 8.9 8.8 --  MG 1.9 -- 2.1  PHOS -- -- --    Basename 11/29/11 0940  AST 31  ALT 27  ALKPHOS 106  BILITOT 2.6*  PROT 5.4*  ALBUMIN 2.5*   No results found for this basename: LIPASE:2,AMYLASE:2 in the last 72 hours  Basename 11/30/11 0530 11/29/11 0400  WBC 14.3* 17.0*  NEUTROABS -- --  HGB 12.2 13.3  HCT 34.3* 37.6  MCV 87.7 88.1  PLT 132* 122*   No results found for this basename: CKTOTAL:4,CKMB:4,TROPONINI:4 in the last 72 hours No components found with this basename: POCBNP:3 No results found for this basename: DDIMER in the last 72 hours No results found for this basename: HGBA1C in the last 72  hours No results found for this basename: CHOL,HDL,LDLCALC,TRIG,CHOLHDL in the last 72 hours  Basename 11/29/11 0940  TSH 3.747  T4TOTAL --  T3FREE --  THYROIDAB --   No results found for this basename: VITAMINB12,FOLATE,FERRITIN,TIBC,IRON,RETICCTPCT in the last 72 hours  Medications: Scheduled    . amiodarone  400 mg Oral Q12H   Followed by  . amiodarone  400 mg Oral Daily  . aspirin EC  325 mg Oral Daily  . docusate sodium  200 mg Oral Daily  . DULoxetine  90 mg Oral Daily  . furosemide  40 mg Oral Daily  . insulin aspart  0-24 Units Subcutaneous TID AC & HS  . insulin glargine  30 Units Subcutaneous BID  . levothyroxine  125 mcg Oral QAC breakfast  . metFORMIN  750 mg Oral BID WC  . metoprolol tartrate  12.5 mg Oral BID  . pantoprazole  40 mg Oral QAC breakfast  . potassium chloride  20 mEq Oral Daily  . povidone-iodine  1 application Topical BID  . simvastatin  10 mg Oral q1800  . sodium chloride  3 mL Intravenous Q12H  .  Vitamin D (Ergocalciferol)  50,000 Units Oral Weekly     Radiology/Studies:  Dg Chest 2 View  11/30/2011  *RADIOLOGY REPORT*  Clinical Data: Follow up CABG  CHEST - 2 VIEW  Comparison: Portable chest x-ray of 11/29/2011  Findings: Aeration has improved minimally.  Basilar opacities remain consistent with atelectasis and effusions left greater than right.  Cardiomegaly is stable.  The venous sheath has been removed from the SVC and no pneumothorax is seen.  Median sternotomy sutures are noted.  IMPRESSION: Minimally improved aeration.  Persistent basilar opacities consistent with atelectasis and effusion.  Original Report Authenticated By: Juline Patch, M.D.   Dg Chest Portable 1 View In Am  11/29/2011  *RADIOLOGY REPORT*  Clinical Data: Postop cardiac surgery.  PORTABLE CHEST - 1 VIEW  Comparison: 11/28/2011  Findings: Swan-Ganz catheter has been removed but the introducer remains.  Cardiomegaly persists.  Sternal wires status post O H S. Left chest  tube removed.  No pneumothorax.  Left lower lobe atelectasis with effusion appears slightly worse.  Small right effusion.  No overt failure.  IMPRESSION: Worsening aeration.  Swan-Ganz catheter removed.  Original Report Authenticated By: Elsie Stain, M.D.    INR: Will add last result for INR, ABG once components are confirmed Will add last 4 CBG results once components are confirmed  Assessment/Plan: S/P Procedure(s) (LRB): CORONARY ARTERY BYPASS GRAFTING (CABG) (N/A)  1. Clinically improving each day 2. CBG 146-181 range for 24 hours, cont current rx for now, may need further adjustment 3. In NSR on amio/Beta blocker, elevated T BILI, monitor AST/ALT, normal, no GI sx 4. Leukocytosis improving 5. Cont diuresis/ pulm toilet/ cardiac rehab   LOS: 5 days    GOLD,WAYNE E 12/22/20128:22 AM    I have seen and examined the patient and agree with the assessment above. Hopefully home in 1-2 days

## 2011-11-30 NOTE — Plan of Care (Signed)
Problem: Phase III Progression Outcomes Goal: Time patient transferred to PCTU/Telemetry POD POD 2

## 2011-11-30 NOTE — Progress Notes (Signed)
Patient ambulated 150 ft with 1 liter 02.  02 sats 94/1 lpm.  Patient tolerated procedure well.  Will continue to monitor.

## 2011-11-30 NOTE — Progress Notes (Signed)
Patient develop rapid heart rate at about 1159 am. After close observation she drop into afib. PA Mr Deniece Portela notified, new order to give 25mg  p.o of metoprolol. Rate continued between 110-130. Patient later complain of chest pain. EKG was done and PA Mr Deniece Portela notified with EKG result. Order for 2.5mg  of metoprolol IV. And continue to monitor. Patient later converted to sinus rhythm after a couple of hours. Marigene Ehlers RN

## 2011-11-30 NOTE — Progress Notes (Signed)
CARDIAC REHAB PHASE I   PRE:  Rate/Rhythm: 79 SR  BP:  Supine: 125/52  Sitting:   Standing:    SaO2: 95 RA  MODE:  Ambulation: 550 ft   POST:  Rate/Rhythem: 89 SR  BP:  Supine:   Sitting: 141/70  Standing:    SaO2: 100 RA  Pt tolerated walk well without c/o, using rolling walker and assist x1. Pt stable on walk but a little unsure of herself without the walker at this point.  Pt returned to bed after walk with call bell in reach.  Reveiwed d/c teaching.  Pt unsure of [facility] location, [facility] vs [facility], so we will send info to both locations.  Pt encouraged to walk with family and staff.  We will f/u on Monday.  Fabio Pierce, MA, ACSM RCEP [phone]   Hazle Nordmann

## 2011-12-01 LAB — BASIC METABOLIC PANEL
BUN: 17 mg/dL (ref 6–23)
CO2: 28 mEq/L (ref 19–32)
Calcium: 9 mg/dL (ref 8.4–10.5)
Chloride: 97 mEq/L (ref 96–112)
Creatinine, Ser: 0.72 mg/dL (ref 0.50–1.10)
GFR calc Af Amer: >90 mL/min (ref >90)
GFR calc non Af Amer: >90 mL/min (ref >90)
Glucose, Bld: 141 mg/dL — ABNORMAL HIGH (ref 70–99)
Potassium: 4.7 mEq/L (ref 3.5–5.1)
Sodium: 135 mEq/L (ref 135–145)

## 2011-12-01 MED ORDER — INSULIN GLARGINE 100 UNIT/ML ~~LOC~~ SOLN: 30.0000 [IU] | Freq: Every day | SUBCUTANEOUS | Status: DC

## 2011-12-01 MED ORDER — METOPROLOL TARTRATE 25 MG PO TABS: 25.0000 mg | ORAL_TABLET | Freq: Two times a day (BID) | ORAL | Status: DC

## 2011-12-01 MED ORDER — AMIODARONE HCL 400 MG PO TABS: 400.0000 mg | ORAL_TABLET | Freq: Two times a day (BID) | ORAL | Status: DC

## 2011-12-01 MED ORDER — METFORMIN HCL ER 750 MG PO TB24: 750.0000 mg | ORAL_TABLET | Freq: Two times a day (BID) | ORAL | Status: DC

## 2011-12-01 MED ORDER — OXYCODONE HCL 5 MG PO TABS: 5.0000 mg | ORAL_TABLET | Freq: Four times a day (QID) | ORAL | Status: DC | PRN

## 2011-12-01 NOTE — Progress Notes (Signed)
Subjective:  Patient doing well post bypass but developed atrial fibrillation last night. She converted spontaneously to sinus rhythm after having been given metoprolol but was started on amiodarome. She feels reasonably well now.  Objective:  Vital Signs in the last 24 hours: BP 118/56  Pulse 71  Temp(Src) 98.1 F (36.7 C) (Oral)  Resp 18  Ht 5\' 2"  (1.575 m)  Wt 78.518 kg (173 lb 1.6 oz)  BMI 31.66 kg/m2  SpO2 100%  Physical Exam:  Lungs:  Clear to A&P Cardiac:  Regular rhythm, normal S1 and S2, no S3 Abdomen:  Soft, nontender, no masses Extremities:  No edema present  Intake/Output from previous day: 12/22 0701 - 12/23 0700 In: 720 [P.O.:720] Out: 1100 [Urine:1100]  Lab Results: Basic Metabolic Panel:  Basename 12/01/11 0700 11/30/11 0530  NA 135 131*  K 4.7 4.6  CL 97 97  CO2 28 27  GLUCOSE 141* 160*  BUN 17 17  CREATININE 0.72 0.64    CBC:  Basename 11/30/11 0530 11/29/11 0400  WBC 14.3* 17.0*  NEUTROABS -- --  HGB 12.2 13.3  HCT 34.3* 37.6  MCV 87.7 88.1  PLT 132* 122*    Telemetry: Atrial fibrillation last evening. Currently in sinus rhythm.  Assessment/Plan:  1. Postoperative atrial fibrillation that she is currently receiving amiodarone. She is otherwise doing well following bypass grafting for significant unstable angina.     Darden Palmer.  MD [facility] 12/01/2011, 11:19 AM

## 2011-12-01 NOTE — Progress Notes (Addendum)
[address]            [place] [address]          [phone]     4 Days Post-Op  Procedure(s) (LRB): CORONARY ARTERY BYPASS GRAFTING (CABG) (N/A) Subjective: Overall feeling well, did have episode yesterday of afib to 120's with some chest discomfort during episode. Received additional Beta blocker and it resolved. Feels a little "wobbly" with ambulation  Objective  Telemetry Current NSR   Temp:  [97.9 F (36.6 C)-98.1 F (36.7 C)] 98.1 F (36.7 C) (12/23 0405) Pulse Rate:  [71-80] 71  (12/23 0405) Resp:  [18-19] 18  (12/23 0405) BP: (107-118)/(56-71) 118/56 mmHg (12/23 0405) SpO2:  [96 %-100 %] 100 % (12/23 0405) Weight:  [173 lb 1.6 oz (78.518 kg)] 173 lb 1.6 oz (78.518 kg) (12/23 0405)   Intake/Output Summary (Last 24 hours) at 12/01/11 0750 Last data filed at 12/01/11 0600  Gross per 24 hour  Intake    720 ml  Output   1100 ml  Net   -380 ml       General appearance: alert, cooperative and no distress Heart: regular rate and rhythm, S1, S2 normal, no murmur, click, rub or gallop Lungs: diminished in bases Abdomen: soft, non-tender; bowel sounds normal; no masses,  no organomegaly Extremities: minor edema  Wound: incisions healing well without signs of infection  Lab Results:  Basename 11/30/11 0530 11/29/11 0400 11/28/11 1700  NA 131* 135 --  K 4.6 4.4 --  CL 97 101 --  CO2 27 28 --  GLUCOSE 160* 197* --  BUN 17 14 --  CREATININE 0.64 0.56 --  CALCIUM 8.9 8.8 --  MG 1.9 -- 2.1  PHOS -- -- --    Basename 11/29/11 0940  AST 31  ALT 27  ALKPHOS 106  BILITOT 2.6*  PROT 5.4*  ALBUMIN 2.5*   No results found for this basename: LIPASE:2,AMYLASE:2 in the last 72 hours  Basename 11/30/11 0530 11/29/11 0400  WBC 14.3* 17.0*  NEUTROABS -- --  HGB 12.2 13.3  HCT 34.3* 37.6  MCV 87.7 88.1  PLT 132* 122*   No results found for this basename: CKTOTAL:4,CKMB:4,TROPONINI:4 in the last 72 hours No components found  with this basename: POCBNP:3 No results found for this basename: DDIMER in the last 72 hours No results found for this basename: HGBA1C in the last 72 hours No results found for this basename: CHOL,HDL,LDLCALC,TRIG,CHOLHDL in the last 72 hours  Basename 11/29/11 0940  TSH 3.747  T4TOTAL --  T3FREE --  THYROIDAB --   No results found for this basename: VITAMINB12,FOLATE,FERRITIN,TIBC,IRON,RETICCTPCT in the last 72 hours  Medications: Scheduled    . amiodarone  400 mg Oral Q12H   Followed by  . amiodarone  400 mg Oral Daily  . aspirin EC  325 mg Oral Daily  . docusate sodium  200 mg Oral Daily  . DULoxetine  90 mg Oral Daily  . furosemide  40 mg Oral Daily  . insulin aspart  0-24 Units Subcutaneous TID AC & HS  . insulin glargine  30 Units Subcutaneous BID  . levothyroxine  125 mcg Oral QAC breakfast  . metFORMIN  750 mg Oral BID WC  . metoprolol      . metoprolol  2.5 mg Intravenous Once  . metoprolol tartrate  25 mg Oral BID  . pantoprazole  40 mg  Oral QAC breakfast  . potassium chloride  20 mEq Oral Daily  . povidone-iodine  1 application Topical BID  . simvastatin  10 mg Oral q1800  . sodium chloride  3 mL Intravenous Q12H  . Vitamin D (Ergocalciferol)  50,000 Units Oral Weekly  . DISCONTD: metoprolol tartrate  12.5 mg Oral BID     Radiology/Studies:  Dg Chest 2 View  11/30/2011  *RADIOLOGY REPORT*  Clinical Data: Follow up CABG  CHEST - 2 VIEW  Comparison: Portable chest x-ray of 11/29/2011  Findings: Aeration has improved minimally.  Basilar opacities remain consistent with atelectasis and effusions left greater than right.  Cardiomegaly is stable.  The venous sheath has been removed from the SVC and no pneumothorax is seen.  Median sternotomy sutures are noted.  IMPRESSION: Minimally improved aeration.  Persistent basilar opacities consistent with atelectasis and effusion.  Original Report Authenticated By: Juline Patch, M.D.    INR: Will add last result for INR,  ABG once components are confirmed Will add last 4 CBG results once components are confirmed  Assessment/Plan: S/P Procedure(s) (LRB): CORONARY ARTERY BYPASS GRAFTING (CABG) (N/A)  1. conts to progress nicely 2. cbg's range 130-172 range for 24 hours 3. Will cont amio, beta blocker and observe rhythm, d/c epw's today 4. pulm toilet/ CRI  LOS: 6 days    GOLD,WAYNE E 12/23/20127:50 AM  I have seen and examined the patient and agree with assessment and plan above. Home in AM if rhythm stable. Transfer to regular 2000 bed

## 2011-12-01 NOTE — Discharge Summary (Signed)
[address]            [place] [address]          [phone]      Denise Cuevas 1951/09/12 60 y.o. [id number]  11/25/2011   Denise Ovens, MD  Non-STEMI (non-ST elevated myocardial infarction) [410.70] chest pain CAD  History of Present Illness: At time of consultation by Dr. Sheliah Cuevas 60 year old female with 6 week history of episodic chest discomfort with diaphoresis nausea and radiation into both arms. She notes that she first noted the episodes while driving to [place] 6 weeks ago since that time they've become increasingly frequent and severe. She underwent echocardiogram and cardiac catheterization last week and was being referred to the cardiac surgery office this week. The patient had an episode of prolonged chest pain last night that has since resolved came to the emergency room with positive enzymes. In-hospital cardiac surgery consult has been requested for consideration of coronary artery bypass grafting. The patient has no previous history of known cardiac disease. She has had diabetes for several years. She's been told in the past that she has thyroid disease with hypothyroidism but quit taking her thyroid and diabetes medicines several months ago.  Patient is a nonsmoker.  She is currently in the coronary care unit on heparin and Integrilin infusion and has had no chest pain since last night when she was admitted. Dr. Tyrone Sage evaluated the patient and her studies and agreed with recommendations to proceed with surgical coronary artery revascularization.  Current Activity/ Functional Status:  Patient is independent with mobility/ambulation, transfers, ADL's, IADL's.    Past Medical History   Diagnosis  Date   .  Hypertension    .  Hypothyroid    .  Fibromyalgia    .  Hypercholesteremia    .  Angina    .  Coronary artery disease    .  GERD (gastroesophageal reflux disease)    .  Headache    .  Depression    .  H/O  hiatal hernia    .  Asthma    .  Diabetes mellitus     Past Surgical History   Procedure  Date   .  Cholecystectomy     History   Smoking status   .  Never Smoker   Smokeless tobacco   .  Never Used    History   Alcohol Use  No    History    Social History   .  Marital Status:  Married     Spouse Name:  N/A     Number of Children:  N/A   .  Years of Education:  N/A    Occupational History   .  Not on file.    Social History Main Topics   .  Smoking status:  Never Smoker   .  Smokeless tobacco:  Never Used   .  Alcohol Use:  No   .  Drug Use:  No   .  Sexually Active:  Yes    Other Topics  Concern   .  Not on file    Social History Narrative    Retired Consulting civil engineer    Allergies   Allergen  Reactions   .  Byetta  Nausea And Vomiting   .  Crestor (Rosuvastatin Calcium)  Nausea And Vomiting    Current Facility-Administered Medications  Medication  Dose  Route  Frequency  Provider  Last Rate  Last Dose   .  0.9 % sodium chloride infusion   Intravenous  Once  Denise Roller, MD  75 mL/hr at 11/25/11 0250    .  0.9 % sodium chloride infusion  250 mL  Intravenous  PRN  Denise Ashley Royalty., MD     .  acetaminophen (TYLENOL) tablet 650 mg  650 mg  Oral  Q4H PRN  Denise Ashley Royalty., MD     .  aspirin EC tablet 81 mg  81 mg  Oral  Daily  Denise Ovens, MD     .  atenolol (TENORMIN) tablet 25 mg  25 mg  Oral  Daily  Denise Ashley Royalty., MD     .  budesonide-formoterol [facility]) 160-4.5 MCG/ACT inhaler 2 puff  2 puff  Inhalation  BID PRN  Denise Ashley Royalty., MD     .  DULoxetine (CYMBALTA) DR capsule 90 mg  90 mg  Oral  Daily  Denise Ashley Royalty., MD   90 mg at 11/25/11 1034   .  eptifibatide (INTEGRILIN) 0.75 mg/mL bolus via infusion 13,600 mcg  180 mcg/kg  Intravenous  Once  [employer] Cuevas, PHARMD   13,600 mcg at 11/25/11 0424   .  eptifibatide (INTEGRILIN) 75 mg / 100 mL infusion  2 mcg/kg/min  Intravenous  Continuous  Denise Cuevas,  PHARMD  12 mL/hr at 11/25/11 1030  2 mcg/kg/min at 11/25/11 1030   .  heparin 100 UNIT/ML infusion       25,000 Units at 11/25/11 0248   .  heparin ADULT infusion 100 units/mL (25000 units/250 mL)  12 Units/kg/hr  Intravenous  Continuous  Denise Roller, MD  9 mL/hr at 11/25/11 0700  900 Units at 11/25/11 0700   .  heparin bolus via infusion 4,000 Units  4,000 Units  Intravenous  Once  Denise Roller, MD   4,000 Units at 11/25/11 0249   .  insulin aspart (novoLOG) injection 0-9 Units  0-9 Units  Subcutaneous  TID WC  Denise Ashley Royalty., MD     .  insulin glargine (LANTUS) injection 24 Units  24 Units  Subcutaneous  Daily  Denise Ashley Royalty., MD   24 Units at 11/25/11 1035   .  levothyroxine (SYNTHROID, LEVOTHROID) tablet 125 mcg  125 mcg  Oral  QAC breakfast  Denise Schultz, MD     .  metFORMIN (GLUCOPHAGE-XR) 24 hr tablet 1,500 mg  1,500 mg  Oral  Q breakfast  Denise Ashley Royalty., MD     .  nitroGLYCERIN (NITROSTAT) SL tablet 0.4 mg  0.4 mg  Sublingual  Q5 min PRN  Denise Ashley Royalty., MD     .  nitroGLYCERIN 0.2 mg/mL in dextrose 5 % infusion  3-30 mcg/min  Intravenous  Titrated  Denise Ashley Royalty., MD  1.5 mL/hr at 11/25/11 0700  5 mcg/min at 11/25/11 0700   .  nitroGLYCERIN 0.2 mg/mL in dextrose 5 % infusion       50,000 mcg at 11/25/11 0636   .  pantoprazole (PROTONIX) EC tablet 40 mg  40 mg  Oral  Q1200  Denise Ashley Royalty., MD     .  simvastatin [facility]) tablet 10 mg  10 mg  Oral  q1800  Darden Palmer., MD     .  sodium chloride 0.9 % injection 3  mL  3 mL  Intravenous  Q12H  Denise Ashley Royalty., MD     .  sodium chloride 0.9 % injection 3 mL  3 mL  Intravenous  PRN  Denise Ashley Royalty., MD     .  Vitamin D (Ergocalciferol) (DRISDOL) capsule 50,000 Units  50,000 Units  Oral  Weekly  Denise Ashley Royalty., MD     .  DISCONTD: aspirin EC tablet 325 mg  325 mg  Oral  Daily  Denise Ashley Royalty., MD   325 mg at 11/25/11 1610   .  DISCONTD: aspirin EC tablet 81 mg  81 mg  Oral  Daily  Denise  Ashley Royalty., MD      Prescriptions prior to admission   Medication  Sig  Dispense  Refill   .  aspirin EC 325 MG tablet  Take 325 mg by mouth daily.     Marland Kitchen  atenolol (TENORMIN) 25 MG tablet  Take 25 mg by mouth daily.     .  budesonide-formoterol (SYMBICORT) 160-4.5 MCG/ACT inhaler  Inhale 2 puffs into the lungs 2 (two) times daily as needed. For shortness of breath     .  DULoxetine (CYMBALTA) 30 MG capsule  Take 90 mg by mouth daily.     .  ergocalciferol (VITAMIN D2) 50000 UNITS capsule  Take 50,000 Units by mouth once a week. Friday     .  insulin glargine (LANTUS) 100 UNIT/ML injection  Inject 24 Units into the skin daily.     Marland Kitchen  levothyroxine (SYNTHROID, LEVOTHROID) 125 MCG tablet  Take 125 mcg by mouth daily before breakfast.     .  metFORMIN (GLUCOPHAGE-XR) 500 MG 24 hr tablet  Take 1,500 mg by mouth daily.     Marland Kitchen  omeprazole (PRILOSEC) 40 MG capsule  Take 40 mg by mouth daily.     .  pravastatin (PRAVACHOL) 20 MG tablet  Take 20 mg by mouth daily.     .  celecoxib (CELEBREX) 100 MG capsule  Take 100 mg by mouth daily as needed. For pain or inflammation      Family History   Problem  Relation  Age of Onset   .  Heart disease  Father  75      died of MI    .  Peripheral vascular disease  Mother  12      bilaterial amputations    Review of Systems: At time of consultation Cardiac Review of Systems: Y or N  Chest Pain [ y ] Resting SOB [ y ] Exertional SOB [ y ] Orthopnea [ n ]  Pedal Edema [n ] Palpitations [n ] Syncope [n ] Presyncope [ n ]  General Review of Systems: [Y] = yes [ ] =no  Consitutional: recent weight change [email or web address] ]; anorexia [ y ]; fatigue [ ] ; nausea [ y]; night sweats [ ] ; fever [ ] ; or chills [ ] ;  Dental: poor dentition[n ];  Eye : blurred vision [n ]; diplopia [ n ]; vision changes [ n]; Amaurosis fugax[n ];  Resp: cough [n ]; wheezing[n ]; hemoptysis[n ]; shortness of breath[ n ]; paroxysmal nocturnal dyspnea[ ] ; dyspnea on exertion[ y ]; or orthopnea[  ];  GI: gallstones[y ], vomiting[ y ]; dysphagia[ n ]; melena[ n ]; hematochezia [ n ]; heartburn[ n ]; Hx of Colonoscopy[ ? ];  GU: kidney stones [ n ]; hematurian ]; peripheral edema[n ]; or itching[ n];  Musculosketetal: myalgias[ y];  joint swelling[ n ]; joint erythema[ n ];  joint pain[ n]; back pain[ y ];  Heme/Lymph: bruising[ y ]; bleeding[n ]; anemia[ n ];  Neuro: TIA[ n]; headaches[ n]; stroke[ ] ; vertigon ]; seizures[ n ]; paresthesias[ n ]; difficulty walking[n ];  Psych:depression[ y ]; anxiety[ n ];  Endocrine: diabetes[ y ]; thyroid dysfunction[y ];  Immunizations: Flu [ ?]; Pneumococcal[ ] ?;  Other:  Physical Exam: At time of consultation BP 95/55  Pulse 70  Temp(Src) 97.9 F (36.6 C) (Oral)  Resp 13  Ht 5\' 2"  (1.575 m)  Wt 162 lb 0.6 oz (73.5 kg)  BMI 29.64 kg/m2  SpO2 98%  General appearance: alert, cooperative, appears older than stated age and moderately obese  Neurologic: intact, I do not appreciate carotid bruits no previous exam are noted a right carotid bruit  Heart: regular rate and rhythm, S1, S2 normal, no murmur, click, rub or gallop and normal apical impulse.  Lungs: clear to auscultation bilaterally  Abdomen: soft, non-tender; bowel sounds normal; no masses, no organomegaly  Extremities: extremities normal, atraumatic, no cyanosis or edema, Homans sign is negative, no sign of DVT, no edema, redness or tenderness in the calves or thighs and no ulcers, gangrene or trophic changes  Cath site right wrist is without hematoma  Diagnostic Studies & Laboratory data:  Recent Radiology Findings:  Dg Chest Port 1 View  11/25/2011 *RADIOLOGY REPORT* Clinical Data: Chest pain, shortness of breath. PORTABLE CHEST - 1 VIEW Comparison: 01/03/2008 Findings: Heart size upper normal limits to mildly enlarged. Mild central vascular congestion. Interstitial prominence may be exaggerated by poor inspiratory effort. No definite focal areas of consolidation. No pleural effusion  or pneumothorax. No acute osseous abnormality. IMPRESSION: Heart size upper normal limits with central vascular congestion. No overt edema. Interstitial crowding is likely exaggerated by technique/respiratory effort. Original Report Authenticated By: Waneta Martins, M.D.   Recent Lab Findings:  Lab Results   Component  Value  Date    WBC  6.7  11/25/2011    HGB  13.1  11/25/2011    HCT  37.6  11/25/2011    PLT  115*  11/25/2011    GLUCOSE  238*  11/25/2011    ALT  55*  11/25/2011    AST  66*  11/25/2011    NA  138  11/25/2011    K  4.2  11/25/2011    CL  102  11/25/2011    CREATININE  0.66  11/25/2011    BUN  17  11/25/2011    CO2  30  11/25/2011    INR  1.11  11/25/2011    Cath 12/11/20012:  PROCEDURE: Left heart catheterization with selective coronary angiography, left ventriculogram via the radial artery approach.  INDICATIONS: 61 year old diabetic with hypertension, hyperlipidemia, strong family history of CAD with her father dying in his early 69s with classic anginal symptoms, radiating and left arm across her chest wall up to jaw, nose, neck. Squeezing. Felt nausea at times as well as diaphoresis and this was brought about with exertion. Walking her dog for instance. Feels like a choking sensation. Usually lasts about 30 minutes duration at its worst and subsides with rest. This all started in early November. Her mother also had CAD, PVD with AA and PT. She has a lot of stress in her life at the child with autism. She also has carotid bruits which are currently being evaluated.  The risks, benefits, and details of the procedure were explained to the patient,  including possibilities of stroke, heart attack, death, renal impairment, arterial damage, bleeding. The patient verbalized understanding and wanted to proceed. Informed written consent was obtained.  PROCEDURE TECHNIQUE: Allen's test was performed pre-and post procedure and was normal. The right radial artery site was prepped  and draped in a sterile fashion. One percent lidocaine was used for local anesthesia. Using the modified Seldinger technique a 5 French hydrophilic sheath was inserted into the radial artery without difficulty. 3 mg of verapamil was administered via the sheath. A Tiger with the guidance of a Versicore wire was placed in the right coronary cusp and selectively cannulated the right coronary artery. After traversing the aortic arch, 3800 units of heparin IV was administered. A Judkins left #3.5 catheter was used to selectively cannulate the left main artery. In fact, she had dual ostia of LAD and circumflex. Multiple views with hand injection of Omnipaque were obtained. Catheter a pigtail catheter was used to cross into the left ventricle, hemodynamics were obtained, and a left ventriculogram was performed in the RAO position with power injection. Following the procedure, sheath was removed, patient was hemodynamically stable, hemostasis was maintained with a Terumo T band.  CONTRAST: Total of 105 ml.  FLOUROSCOPY TIME: 10 min.  COMPLICATIONS: None.  HEMODYNAMICS: Aortic pressure was 125/69 with a mean of 92 ; LV pressure was 126/14/20; LVEDP 20 mmHg. There was no gradient between the left ventricle and aorta.  ANGIOGRAPHIC DATA:  Left main: There truly was no left main. Dual ostia.  Left anterior descending (LAD): after the bifurcation of a large diagonal branch, there was significant and diffuse disease in the mid LAD segment likely over 40 mm of vessel and a focal lesion distally in the LAD as well of up to 70%. The mid LAD long segment at points is up to 80-90% significant stenosis. Nitroglycerin was administered and did not alter the confirmation of the vessel dramatically.  Circumflex artery (CIRC): It was difficult to selectively cannulate the ostia of the circumflex. She did however have a focal 90% proximal obtuse marginal lesion.  Right coronary artery (RCA): This is the dominant vessel giving rise  to the PDA which is a small caliber vessel.There is no significant CAD present.  LEFT VENTRICULOGRAM: Left ventricular angiogram was done in the 30 RAO projection and revealed normal left ventricular wall motion and systolic function with an estimated ejection fraction of 65 %. LVEDP was 20 mmHg.  IMPRESSIONS:  1. Severe coronary artery disease of the mid LAD just after the bifurcation of a large diagonal branch, long segment of stenosis of up to 80-90%. Focal distal LAD lesion of up to 70%. Focal obtuse marginal lesion of 90%. 2.Normal left ventricular systolic function. LVEDP 20 mmHg. Ejection fraction 65 %.  RECOMMENDATION:  I discussed the case with the patient as well as Dr. Verdis Prime. Given her diabetic status, extensively diseased mid LAD, I will refer to cardiothoracic surgery for discussion of bypass surgery. If percutaneous intervention were to take place, she would need a focal stent in the obtuse marginal and a long LAD stent which would jail the first diagonal branch. This could lead to repeat procedures in the future.   ECHO done in [facility]- report only to be scanned Moderate ca++ aortic valve without stenosis , peak velocity at aortic valve 1.88 m/s, trace mr , NL EF  The patient was deemed medically stable to proceed with the procedure and on 11/27/2011 she was taken to the operating room and underwent the  following procedure:  PREOPERATIVE DIAGNOSES: Coronary occlusive disease with recent  subendocardial myocardial infarction.  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  SURGICAL PROCEDURE: Coronary artery bypass grafting x2 with the left  internal mammary to the left anterior descending coronary artery and  reverse saphenous vein graft to the circumflex coronary artery, with  endo-vein harvesting from the right thigh.  SURGEON: Denise Cuevas, M.D.  FIRST ASSISTANT: Denise Cuevas SA.  She tolerated  procedure well was taken to the surgical intensive care he in stable condition.   Postoperative hospital course:  Overall she has done quite nicely. She has remained hemodynamically stable. She did have postoperative atrial fibrillation but has subsequently been chemically cardioverted to normal sinus rhythm. Her diabetes has been under adequate control using standard protocols. All routine lines, monitors, drainage devices have been discontinued in a standard fashion. She is tolerating a gradual increase in activities using standard protocols. Incisions are healing well without evidence of infection. She did have a postoperative thrombocytopenia values are improved with most recent platelet count on 11/30/2011 132,000. She has a mild acute blood loss anemia. Most recent hematocrit dated 11/30/2011 is 34.3. Overall her status is felt to be tentatively stable for discharge in the next one to 2 days pending morning rounds.    Basename 12/01/11 0700 11/30/11 0530  NA 135 131*  K 4.7 4.6  CL 97 97  CO2 28 27  GLUCOSE 141* 160*  BUN 17 17  CALCIUM 9.0 8.9    Basename 11/30/11 0530 11/29/11 0400  WBC 14.3* 17.0*  HGB 12.2 13.3  HCT 34.3* 37.6  PLT 132* 122*   No results found for this basename: INR:2 in the last 72 hours   Discharge Instructions:  The patient is discharged to home with extensive instructions on wound care and progressive ambulation.  They are instructed not to drive or perform any heavy lifting until returning to see the physician in his office.  Discharge Diagnosis:  Non-STEMI (non-ST elevated myocardial infarction) [410.70] chest pain CAD  Secondary Diagnosis: Patient Active Problem List  Diagnoses  . Other and unspecified angina pectoris  . Type II or unspecified type diabetes mellitus without mention of complication, not stated as uncontrolled  . Unstable angina pectoris  . Hypothyroidism  . CAD (coronary artery disease)   Past Medical History  Diagnosis  Date  . Hypertension   . Hypothyroid   . Fibromyalgia   . Hypercholesteremia   . Angina   . Coronary artery disease   . GERD (gastroesophageal reflux disease)   . Headache   . Depression   . H/O hiatal hernia   . Asthma   . Diabetes mellitus        Timaya, Bojarski  Home Medication Instructions [id number]   Printed on:12/01/11 1052  Medication Information                    budesonide-formoterol (SYMBICORT) 160-4.5 MCG/ACT inhaler Inhale 2 puffs into the lungs 2 (two) times daily as needed. For shortness of breath            DULoxetine (CYMBALTA) 30 MG capsule Take 90 mg by mouth daily.             omeprazole (PRILOSEC) 40 MG capsule Take 40 mg by mouth daily.             aspirin EC 325 MG tablet Take 325 mg by mouth daily.  pravastatin (PRAVACHOL) 20 MG tablet Take 20 mg by mouth daily.             ergocalciferol (VITAMIN D2) 50000 UNITS capsule Take 50,000 Units by mouth once a week. Friday            levothyroxine (SYNTHROID, LEVOTHROID) 125 MCG tablet Take 125 mcg by mouth daily before breakfast.             amiodarone (PACERONE) 400 MG tablet Take 1 tablet (400 mg total) by mouth every 12 (twelve) hours.           insulin glargine (LANTUS) 100 UNIT/ML injection Inject 30 Units into the skin daily.           metFORMIN (GLUCOPHAGE-XR) 750 MG 24 hr tablet Take 1 tablet (750 mg total) by mouth 2 (two) times daily with a meal.           metoprolol tartrate (LOPRESSOR) 25 MG tablet Take 1 tablet (25 mg total) by mouth 2 (two) times daily.           oxyCODONE (OXY IR/ROXICODONE) 5 MG immediate release tablet Take 1-2 tablets (5-10 mg total) by mouth every 6 (six) hours as needed.             Disposition: Discharged home  Patient's condition is Good  Gershon Crane, PA-C 12/01/2011  10:52 AM

## 2011-12-01 NOTE — Plan of Care (Signed)
Problem: Phase III Progression Outcomes Goal: Discharge plan remains appropriate-arrangements made Outcome: Completed/Met Date Met:  12/01/11 Discharge planned for 12/02/11

## 2011-12-01 NOTE — Progress Notes (Signed)
EPW discontinued per protocol. Tips intact. Patient tolerated well. Last INR INR/Prothrombin Time on   .  Patient advised Bedrest X 1 hour.chest tube sutures also removed. Patient tolerated well. Elease Hashimoto

## 2011-12-02 LAB — BASIC METABOLIC PANEL
BUN: 14 mg/dL (ref 6–23)
CO2: 29 mEq/L (ref 19–32)
Calcium: 8.7 mg/dL (ref 8.4–10.5)
Chloride: 99 mEq/L (ref 96–112)
Creatinine, Ser: 0.82 mg/dL (ref 0.50–1.10)
GFR calc Af Amer: 90 mL/min — ABNORMAL LOW (ref >90)
GFR calc non Af Amer: 77 mL/min — ABNORMAL LOW (ref >90)
Glucose, Bld: 104 mg/dL — ABNORMAL HIGH (ref 70–99)
Potassium: 3.9 mEq/L (ref 3.5–5.1)
Sodium: 135 mEq/L (ref 135–145)

## 2011-12-02 LAB — GLUCOSE, CAPILLARY: Glucose-Capillary: 96 mg/dL (ref 70–99)

## 2011-12-02 NOTE — Progress Notes (Addendum)
5 Days Post-Op Procedure(s) (LRB): CORONARY ARTERY BYPASS GRAFTING (CABG) (N/A)  Subjective: Feels well, no complaints.  No further a-fib.  Objective: Vital signs in last 24 hours: Patient Vitals for the past 24 hrs:  BP Temp Temp src Pulse Resp SpO2 Weight  12/02/11 0644 105/68 mmHg 98.2 F (36.8 C) Oral 68  16  94 % 170 lb 10.2 oz (77.4 kg)  12/01/11 1934 111/72 mmHg 98.8 F (37.1 C) Oral 81  18  94 % -  12/01/11 1437 100/54 mmHg 98 F (36.7 C) - 74  18  98 % -  12/01/11 1305 98/51 mmHg - - 76  18  92 % -  12/01/11 1235 100/53 mmHg - - 72  18  94 % -  12/01/11 1220 112/53 mmHg - - 68  16  96 % -  12/01/11 1205 118/56 mmHg - - 69  16  94 % -  12/01/11 1150 113/57 mmHg - - 70  18  96 % -   Current Weight  12/02/11 170 lb 10.2 oz (77.4 kg)     Intake/Output from previous day: 12/23 0701 - 12/24 0700 In: 1080 [P.O.:1080] Out: 1150 [Urine:1150]  CBGs 135-94-104  PHYSICAL EXAM:  Heart: RRR Lungs: clear Wound: clean and dry Extremities: no significant edema  Lab Results: CBC: Basename 11/30/11 0530  WBC 14.3*  HGB 12.2  HCT 34.3*  PLT 132*   BMET:  Basename 12/02/11 0600 12/01/11 0700  NA 135 135  K 3.9 4.7  CL 99 97  CO2 29 28  GLUCOSE 104* 141*  BUN 14 17  CREATININE 0.82 0.72  CALCIUM 8.7 9.0    PT/INR: No results found for this basename: LABPROT,INR in the last 72 hours   Assessment/Plan: S/P Procedure(s) (LRB): CORONARY ARTERY BYPASS GRAFTING (CABG) (N/A) Plan for discharge: see discharge orders   LOS: 7 days    COLLINS,GINA H 12/02/2011    Chart reviewed, patient examined, agree with above. She looks good for discharge.

## 2011-12-02 NOTE — Progress Notes (Signed)
Patient ambulated 356ft   On room air.  Patient sats 93/ra.  Patient tolerated walking well. Will continue to monitor.

## 2011-12-05 ENCOUNTER — Telehealth: Payer: Self-pay

## 2011-12-05 ENCOUNTER — Emergency Department (HOSPITAL_COMMUNITY)
Admission: EM | Admit: 2011-12-05 | Discharge: 2011-12-06 | Disposition: A | Discharge status: Home / Self Care | Payer: BC Managed Care – PPO | Attending: Emergency Medicine | Admitting: Emergency Medicine

## 2011-12-05 ENCOUNTER — Encounter (HOSPITAL_COMMUNITY): Payer: Self-pay | Admitting: *Deleted

## 2011-12-05 ENCOUNTER — Other Ambulatory Visit: Payer: Self-pay

## 2011-12-05 ENCOUNTER — Emergency Department (HOSPITAL_COMMUNITY): Payer: BC Managed Care – PPO

## 2011-12-05 DIAGNOSIS — R059 Cough, unspecified: Secondary | ICD-10-CM | POA: Insufficient documentation

## 2011-12-05 DIAGNOSIS — I2510 Atherosclerotic heart disease of native coronary artery without angina pectoris: Secondary | ICD-10-CM | POA: Insufficient documentation

## 2011-12-05 DIAGNOSIS — J90 Pleural effusion, not elsewhere classified: Secondary | ICD-10-CM | POA: Insufficient documentation

## 2011-12-05 DIAGNOSIS — R079 Chest pain, unspecified: Secondary | ICD-10-CM | POA: Insufficient documentation

## 2011-12-05 DIAGNOSIS — M25519 Pain in unspecified shoulder: Secondary | ICD-10-CM | POA: Insufficient documentation

## 2011-12-05 DIAGNOSIS — Z951 Presence of aortocoronary bypass graft: Secondary | ICD-10-CM | POA: Insufficient documentation

## 2011-12-05 DIAGNOSIS — Z79899 Other long term (current) drug therapy: Secondary | ICD-10-CM | POA: Insufficient documentation

## 2011-12-05 DIAGNOSIS — R05 Cough: Secondary | ICD-10-CM | POA: Insufficient documentation

## 2011-12-05 LAB — POCT I-STAT, CHEM 8
Creatinine, Ser: 0.8 mg/dL (ref 0.50–1.10)
Glucose, Bld: 129 mg/dL — ABNORMAL HIGH (ref 70–99)
Hemoglobin: 11.2 g/dL — ABNORMAL LOW (ref 12.0–15.0)
Sodium: 139 mEq/L (ref 135–145)
TCO2: 28 mmol/L (ref 0–100)

## 2011-12-05 LAB — DIFFERENTIAL
Basophils Relative: 0 % (ref 0–1)
Eosinophils Absolute: 0.3 10*3/uL (ref 0.0–0.7)
Eosinophils Relative: 4 % (ref 0–5)
Lymphs Abs: 2.4 10*3/uL (ref 0.7–4.0)
Monocytes Relative: 10 % (ref 3–12)

## 2011-12-05 LAB — CBC
MCH: 31.4 pg (ref 26.0–34.0)
MCHC: 35.5 g/dL (ref 30.0–36.0)
MCV: 88.4 fL (ref 78.0–100.0)
Platelets: 166 10*3/uL (ref 150–400)
RBC: 3.79 MIL/uL — ABNORMAL LOW (ref 3.87–5.11)

## 2011-12-05 MED ORDER — IBUPROFEN 200 MG PO TABS
400.0000 mg | ORAL_TABLET | Freq: Once | ORAL | Status: AC
  Administered 2011-12-05: 400 mg via ORAL
  Filled 2011-12-05: qty 2

## 2011-12-05 MED ORDER — IOHEXOL 350 MG/ML SOLN
100.0000 mL | Freq: Once | INTRAVENOUS | Status: AC | PRN
  Administered 2011-12-05: 100 mL via INTRAVENOUS

## 2011-12-05 MED ORDER — FENTANYL CITRATE 0.05 MG/ML IJ SOLN
100.0000 ug | Freq: Once | INTRAMUSCULAR | Status: AC
  Administered 2011-12-05: 100 ug via INTRAVENOUS
  Filled 2011-12-05: qty 2

## 2011-12-05 NOTE — ED Provider Notes (Signed)
History     CSN: [id number]  Arrival date & time 12/05/11  7829   First MD Initiated Contact with Patient 12/05/11 2112      Chief Complaint  Patient presents with  . Shoulder Pain    (Consider location/radiation/quality/duration/timing/severity/associated sxs/prior treatment) Patient is a 60 y.o. female presenting with shoulder pain. The history is provided by the patient and the spouse.  Shoulder Pain This is a new problem. The current episode started 6 to 12 hours ago. The problem occurs constantly. The problem has been gradually worsening. Associated symptoms include chest pain (Left upper). Pertinent negatives include no headaches and no shortness of breath. The symptoms are aggravated by twisting (Moving left arm and, lying supine). The symptoms are relieved by nothing. Treatments tried: Oxycodone. The treatment provided no relief.   She has mild, nonproductive cough. She was discharged after coronary artery bypass grafting 4 days ago. The current pain does not feel like the pain with the MI that she had prior to the CABG. She has no fever or change in bowel or urine habits.  Past Medical History  Diagnosis Date  . Hypertension   . Hypothyroid   . Fibromyalgia   . Hypercholesteremia   . Angina   . Coronary artery disease   . GERD (gastroesophageal reflux disease)   . Headache   . Depression   . H/O hiatal hernia   . Asthma   . Diabetes mellitus     Past Surgical History  Procedure Date  . Cholecystectomy   . Coronary artery bypass graft 11/27/2011    Procedure: CORONARY ARTERY BYPASS GRAFTING (CABG);  Surgeon: Delight Ovens, MD;  Location: [facility] OR;  Service: Open Heart Surgery;  Laterality: N/A;  coronary artery bypass graft on pump times two utilizing left internal mammary artery and right saphenous vein harvested endoscopically     Family History  Problem Relation Age of Onset  . Heart disease Father 41    died of MI  . Peripheral vascular disease Mother 17      bilaterial amputations    History  Substance Use Topics  . Smoking status: Never Smoker   . Smokeless tobacco: Never Used  . Alcohol Use: No    OB History    Grav Para Term Preterm Abortions TAB SAB Ect Mult Living                  Review of Systems  Respiratory: Negative for shortness of breath.   Cardiovascular: Positive for chest pain (Left upper).  Neurological: Negative for headaches.  All other systems reviewed and are negative.    Allergies  Byetta and Crestor  Home Medications   Current Outpatient Rx  Name Route Sig Dispense Refill  . AMIODARONE HCL 400 MG PO TABS Oral Take 1 tablet (400 mg total) by mouth every 12 (twelve) hours. 70 tablet 1    Take 400 mg twice daily for 7 day; s then 400 mg o ...  . ASPIRIN EC 325 MG PO TBEC Oral Take 325 mg by mouth daily.      . BUDESONIDE-FORMOTEROL FUMARATE 160-4.5 MCG/ACT IN AERO Inhalation Inhale 2 puffs into the lungs 2 (two) times daily as needed. For shortness of breath     . DULOXETINE HCL 30 MG PO CPEP Oral Take 90 mg by mouth daily.     . ERGOCALCIFEROL 50000 UNITS PO CAPS Oral Take 50,000 Units by mouth once a week. Friday    . INSULIN GLARGINE 100 UNIT/ML Reynolds  SOLN Subcutaneous Inject 30 Units into the skin daily. 10 mL   . LEVOTHYROXINE SODIUM 125 MCG PO TABS Oral Take 125 mcg by mouth daily before breakfast.      . METFORMIN HCL ER 750 MG PO TB24 Oral Take 1 tablet (750 mg total) by mouth 2 (two) times daily with a meal. 60 tablet 1  . METOPROLOL TARTRATE 25 MG PO TABS Oral Take 1 tablet (25 mg total) by mouth 2 (two) times daily. 60 tablet 1  . OMEPRAZOLE 40 MG PO CPDR Oral Take 40 mg by mouth daily.      . OXYCODONE HCL 5 MG PO TABS Oral Take 5-10 mg by mouth every 6 (six) hours as needed. For pain     . PRAVASTATIN SODIUM 20 MG PO TABS Oral Take 20 mg by mouth daily.        BP 116/63  Pulse 71  Temp(Src) 98.1 F (36.7 C) (Oral)  Resp 18  SpO2 97%  Physical Exam  Nursing note and vitals  reviewed. Constitutional: She is oriented to person, place, and time. She appears well-developed and well-nourished.  HENT:  Head: Normocephalic and atraumatic.  Eyes: Conjunctivae and EOM are normal. Pupils are equal, round, and reactive to light.  Neck: Normal range of motion and phonation normal. Neck supple.  Cardiovascular: Normal rate, regular rhythm and intact distal pulses.   Pulmonary/Chest: Effort normal.       Decreased breath sounds left lung. No wheezes, rales, or rhonchi. Moderate left upper chest wall tenderness and mild, left scapula tenderness. She has a healing midsternal incision consistent with her recent CABG; no associated bleeding or drainage.  Abdominal: Soft. She exhibits no distension. There is no tenderness. There is no guarding.  Musculoskeletal: Normal range of motion. She exhibits no edema and no tenderness.  Neurological: She is alert and oriented to person, place, and time. She has normal strength and normal reflexes. She exhibits normal muscle tone.  Skin: Skin is warm and dry.  Psychiatric: She has a normal mood and affect. Her behavior is normal. Judgment and thought content normal.    ED Course  Procedures (including critical care time)  Labs Reviewed  CBC - Abnormal; Notable for the following:    RBC 3.79 (*)    Hemoglobin 11.9 (*)    HCT 33.5 (*)    All other components within normal limits  POCT I-STAT, CHEM 8 - Abnormal; Notable for the following:    Glucose, Bld 129 (*)    Hemoglobin 11.2 (*)    HCT 33.0 (*)    All other components within normal limits  GLUCOSE, CAPILLARY - Abnormal; Notable for the following:    Glucose-Capillary 118 (*)    All other components within normal limits  DIFFERENTIAL  POCT I-STAT TROPONIN I  I-STAT, CHEM 8  I-STAT TROPONIN I  POCT CBG MONITORING   Dg Chest 2 View  12/05/2011  *RADIOLOGY REPORT*  Clinical Data: Chest pain  CHEST - 2 VIEW  Comparison: 11/30/2011  Findings: Prior median sternotomy CABG  procedure.  Moderate left pleural effusion and small right pleural effusion are identified.  When compared with the previous exam the left pleural effusion appears increased in volume.  Bibasilar atelectasis noted.  No interstitial edema.  IMPRESSION:  1.  Increase in left pleural effusion. 2.  Stable small right effusion.  Original Report Authenticated By: Rosealee Albee, M.D.   Ct Angio Chest W/cm &/or Wo Cm  12/05/2011  *RADIOLOGY REPORT*  Clinical Data:  Status post open heart surgery, with postoperative left upper chest and scapular pain.  CT ANGIOGRAPHY CHEST WITH CONTRAST  Technique:  Multidetector CT imaging of the chest was performed using the standard protocol during bolus administration of intravenous contrast.  Multiplanar CT image reconstructions including MIPs were obtained to evaluate the vascular anatomy.  Contrast: OMNIPAQUE IOHEXOL 350 MG/ML IV SOLN  Comparison:  Chest radiograph performed earlier today at 07:35 p.m.  Findings:  There is no evidence of significant pulmonary embolus. Evaluation for pulmonary embolus is suboptimal in areas of airspace consolidation, and due to motion artifact.  Small right and moderate left pleural effusions are noted, with partial consolidation of both lower lung lobes.  Mild left upper lobe atelectasis is also noted.  There is no evidence of pneumothorax.  No masses are identified; no abnormal focal contrast enhancement is seen.  Trace postoperative pericardial fluid is noted underlying the patient's median sternotomy, most prominent inferiorly.  A single focus of postoperative air is noted along the superior mediastinum.  The mediastinum is otherwise unremarkable in appearance.  The great vessels are grossly unremarkable.  No mediastinal lymphadenopathy is seen; a borderline 1.0 cm right paratracheal node is seen.  No axillary lymphadenopathy is seen.  The visualized portions of the thyroid gland are unremarkable in appearance.  The visualized portions  of the liver are unremarkable.  The spleen is mildly enlarged, measuring 13.5 cm in length.  The patient is status post cholecystectomy, with clips noted along the gallbladder fossa.  A moderate hiatal hernia is noted.  There is slightly unusual wall thickening along the distal esophagus, raising question for mild esophagitis; mass is considered less likely, but cannot be excluded.  No acute osseous abnormalities are seen.  Review of the MIP images confirms the above findings.  IMPRESSION:  1.  No evidence of significant pulmonary embolus. 2.  Small right and moderate left pleural effusions, with partial consolidation of both lower lung lobes. 3.  Mild left upper lobe atelectasis noted. 4.  Trace postoperative pericardial fluid underlying the median sternotomy, most prominent inferiorly, with minimal postoperative air. 5.  Mild splenomegaly. 6.  Moderate hiatal hernia seen. 7.  Slightly unusual wall thickening along the distal esophagus, raising question for mild esophagitis; mass is considered less likely, but cannot be excluded.  Original Report Authenticated By: Tonia Ghent, M.D.     1. Chest pain   2. Pleural effusion       MDM  Chest pain, with pulmonary effusion, status post CABG. Her symptoms are consistent with pulmonary effusion as cause of pain. No additional problems such as pneumonia, pneumonitis are present on the CT scan.        Flint Melter, MD 12/09/11 [phone]

## 2011-12-05 NOTE — ED Notes (Signed)
MD at bedside. 

## 2011-12-05 NOTE — Telephone Encounter (Signed)
Pt was advised to go to [facility]/ED for evaluation on increase pain with breathing.

## 2011-12-05 NOTE — ED Notes (Signed)
Patient transported to CT 

## 2011-12-05 NOTE — ED Notes (Signed)
Pt reports starting last night she had discomfort to left shoulder blade surrounding area. Denies sob, nausea. Reports diaphoresis. Pt had open heart surgery with double bypass last Wednesday and was told to come to ED for further work up because their office was closing.

## 2011-12-09 ENCOUNTER — Ambulatory Visit (INDEPENDENT_AMBULATORY_CARE_PROVIDER_SITE_OTHER): Payer: Self-pay | Admitting: Surgical

## 2011-12-09 VITALS — BP 112/75 | HR 74 | Temp 97.4°F | Resp 20 | Ht 62.0 in | Wt 168.0 lb

## 2011-12-09 DIAGNOSIS — Z951 Presence of aortocoronary bypass graft: Secondary | ICD-10-CM

## 2011-12-09 DIAGNOSIS — I2510 Atherosclerotic heart disease of native coronary artery without angina pectoris: Secondary | ICD-10-CM

## 2011-12-09 DIAGNOSIS — M25512 Pain in left shoulder: Secondary | ICD-10-CM

## 2011-12-09 DIAGNOSIS — M25519 Pain in unspecified shoulder: Secondary | ICD-10-CM

## 2011-12-09 MED ORDER — CYCLOBENZAPRINE HCL 10 MG PO TABS: 10.0000 mg | ORAL_TABLET | Freq: Three times a day (TID) | ORAL | Status: DC | PRN

## 2011-12-09 MED ORDER — POTASSIUM CHLORIDE ER 10 MEQ PO TBCR: 20.0000 meq | EXTENDED_RELEASE_TABLET | Freq: Every day | ORAL | Status: DC

## 2011-12-09 MED ORDER — CEPHALEXIN 500 MG PO CAPS: 500.0000 mg | ORAL_CAPSULE | Freq: Three times a day (TID) | ORAL | Status: AC

## 2011-12-09 MED ORDER — FUROSEMIDE 40 MG PO TABS: 40.0000 mg | ORAL_TABLET | Freq: Every day | ORAL | Status: DC

## 2011-12-09 NOTE — Progress Notes (Signed)
HPI: Patient returns for routine postoperative follow-up having undergone coronary artery bypass grafting. She was seen 2 days in the emergency department due to severe left shoulder pain. A CT scan at that time revealed a moderate-sized left effusion. She was told to take ibuprofen 400 mg 3 times daily. She is seen today for the same complaint. She has gotten no relief from the ibuprofen. Additionally the oxycodone is getting minimal relief.     Current Outpatient Prescriptions  Medication Sig Dispense Refill  . amiodarone (PACERONE) 400 MG tablet Take 1 tablet (400 mg total) by mouth every 12 (twelve) hours.  70 tablet  1  . aspirin EC 325 MG tablet Take 325 mg by mouth daily.        . budesonide-formoterol (SYMBICORT) 160-4.5 MCG/ACT inhaler Inhale 2 puffs into the lungs 2 (two) times daily as needed. For shortness of breath       . DULoxetine (CYMBALTA) 30 MG capsule Take 90 mg by mouth daily.       . ergocalciferol (VITAMIN D2) 50000 UNITS capsule Take 50,000 Units by mouth once a week. Friday      . insulin glargine (LANTUS) 100 UNIT/ML injection Inject 30 Units into the skin daily.  10 mL    . levothyroxine (SYNTHROID, LEVOTHROID) 125 MCG tablet Take 125 mcg by mouth daily before breakfast.        . metFORMIN (GLUCOPHAGE-XR) 750 MG 24 hr tablet Take 1 tablet (750 mg total) by mouth 2 (two) times daily with a meal.  60 tablet  1  . metoprolol tartrate (LOPRESSOR) 25 MG tablet Take 1 tablet (25 mg total) by mouth 2 (two) times daily.  60 tablet  1  . omeprazole (PRILOSEC) 40 MG capsule Take 40 mg by mouth daily.        Marland Kitchen oxyCODONE (OXY IR/ROXICODONE) 5 MG immediate release tablet Take 5-10 mg by mouth every 6 (six) hours as needed. For pain       . pravastatin (PRAVACHOL) 20 MG tablet Take 20 mg by mouth daily.        . cephALEXin (KEFLEX) 500 MG capsule Take 1 capsule (500 mg total) by mouth 3 (three) times daily.  30 capsule  0  . cyclobenzaprine (FLEXERIL) 10 MG tablet Take 1 tablet  (10 mg total) by mouth every 8 (eight) hours as needed for muscle spasms.  30 tablet  0  . furosemide (LASIX) 40 MG tablet Take 1 tablet (40 mg total) by mouth daily.  14 tablet  11  . potassium chloride (K-DUR) 10 MEQ tablet Take 2 tablets (20 mEq total) by mouth daily.  14 tablet  0    Physical Exam: General: She appears to be in moderate distress secondary to pain. Pulmonary exam: Diminished breath sounds left base. Cardiac exam: S1-S2 regular rate and rhythm Left shoulder exam: Decreased range of motion grossly secondary to pain. Left hand neurovascularly intact. Mild tenderness to palpation left posterior shoulder region. Sternal incision: Minor drainage lower pole with mild erythema. The drainage is serosanguineous. Extremities: 1-2+ bilateral lower extremity edema. EVH incision healing well  Diagnostic Tests: None  Impression: Left shoulder pain, possibly stretch injury related to retractor during surgery. 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 Additionally, she does appear to have some fatty necrosis related to lower pole of the sternal incision.   Plan: I will start her on a plan of Lasix 40 mg daily with potassium chloride 20 mEq daily  for 14 days. Additionally we'll start her on Keflex 500 mg by mouth 3 times a day for 10 days. Also we'll give her a prescription for Flexeril 10 mg by mouth 3 times a day when necessary. We will see her in one week for recheck and when necessary as necessary.

## 2011-12-09 NOTE — Patient Instructions (Signed)
Take medications as recommended. Additionally can try moist heat on the left shoulder region. He will be seen in one week for followup.

## 2011-12-11 NOTE — Telephone Encounter (Signed)
Pt seen in ED 

## 2011-12-19 ENCOUNTER — Encounter: Payer: BC Managed Care – PPO | Admitting: Cardiothoracic Surgery

## 2011-12-19 ENCOUNTER — Other Ambulatory Visit: Payer: Self-pay | Admitting: Cardiothoracic Surgery

## 2011-12-19 DIAGNOSIS — I2510 Atherosclerotic heart disease of native coronary artery without angina pectoris: Secondary | ICD-10-CM

## 2011-12-24 ENCOUNTER — Encounter: Payer: Self-pay | Admitting: Cardiothoracic Surgery

## 2011-12-24 ENCOUNTER — Ambulatory Visit
Admission: RE | Admit: 2011-12-24 | Discharge: 2011-12-24 | Disposition: A | Discharge status: Home / Self Care | Payer: BC Managed Care – PPO | Source: Ambulatory Visit | Attending: Cardiothoracic Surgery | Admitting: Cardiothoracic Surgery

## 2011-12-24 ENCOUNTER — Ambulatory Visit (INDEPENDENT_AMBULATORY_CARE_PROVIDER_SITE_OTHER): Payer: Self-pay | Admitting: Cardiothoracic Surgery

## 2011-12-24 VITALS — BP 116/65 | HR 73 | Resp 18 | Ht 62.0 in | Wt 168.0 lb

## 2011-12-24 DIAGNOSIS — M25512 Pain in left shoulder: Secondary | ICD-10-CM

## 2011-12-24 DIAGNOSIS — I2510 Atherosclerotic heart disease of native coronary artery without angina pectoris: Secondary | ICD-10-CM

## 2011-12-24 DIAGNOSIS — M25519 Pain in unspecified shoulder: Secondary | ICD-10-CM

## 2011-12-24 DIAGNOSIS — T8131XA Disruption of external operation (surgical) wound, not elsewhere classified, initial encounter: Secondary | ICD-10-CM

## 2011-12-24 DIAGNOSIS — Z951 Presence of aortocoronary bypass graft: Secondary | ICD-10-CM

## 2011-12-24 MED ORDER — OXYCODONE HCL 5 MG PO CAPS: 5.0000 mg | ORAL_CAPSULE | ORAL | Status: DC | PRN

## 2011-12-24 NOTE — Progress Notes (Signed)
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  SURGICAL PROCEDURE: Coronary artery bypass grafting x2 with the left  internal mammary to the left anterior descending coronary artery and  reverse saphenous vein graft to the circumflex coronary artery, with  endo-vein harvesting from the right thigh.   History of Present Illness:      Stable at home , no angina, or heart failure. Increasing activity well.     History  Smoking status  . Never Smoker   Smokeless tobacco  . Never Used       Allergies  Allergen Reactions  . Byetta Nausea And Vomiting  . Crestor (Rosuvastatin Calcium) Nausea And Vomiting    Current Outpatient Prescriptions  Medication Sig Dispense Refill  . amiodarone (PACERONE) 400 MG tablet Take 200 mg by mouth daily.      Marland Kitchen aspirin EC 325 MG tablet Take 325 mg by mouth daily.        . budesonide-formoterol (SYMBICORT) 160-4.5 MCG/ACT inhaler Inhale 2 puffs into the lungs 2 (two) times daily as needed. For shortness of breath       . DULoxetine (CYMBALTA) 30 MG capsule Take 90 mg by mouth daily.       . ergocalciferol (VITAMIN D2) 50000 UNITS capsule Take 50,000 Units by mouth once a week. Friday      . insulin glargine (LANTUS) 100 UNIT/ML injection Inject 30 Units into the skin daily.  10 mL    . levothyroxine (SYNTHROID, LEVOTHROID) 125 MCG tablet Take 125 mcg by mouth daily before breakfast.        . metFORMIN (GLUCOPHAGE-XR) 750 MG 24 hr tablet Take 1 tablet (750 mg total)  by mouth 2 (two) times daily with a meal.  60 tablet  1  . metoprolol tartrate (LOPRESSOR) 25 MG tablet Take 1 tablet (25 mg total) by mouth 2 (two) times daily.  60 tablet  1  . omeprazole (PRILOSEC) 40 MG capsule Take 40 mg by mouth daily.        Marland Kitchen oxycodone (OXY-IR) 5 MG capsule Take 5 mg by mouth every 4 (four) hours as needed.      . pravastatin (PRAVACHOL) 20 MG tablet Take 20 mg by mouth daily.             Physical Exam: BP 116/65  Pulse 73  Resp 18  Ht 5\' 2"  (1.575 m)  Wt 168 lb (76.204 kg)  BMI 30.73 kg/m2  SpO2 97%  General appearance: alert, cooperative and no distress Neurologic: intact Heart: regular rate and rhythm, S1, S2 normal, no murmur, click, rub or gallop Lungs: clear to auscultation bilaterally Abdomen: soft, non-tender; bowel sounds normal; no masses,  no organomegaly Extremities: extremities normal, atraumatic, no cyanosis or edema, Homans sign is negative, no sign of DVT and no edema, redness or tenderness in the calves or thighs Wound: stable   Diagnostic Studies & Laboratory  data:         Recent Radiology Findings: Dg Chest 2 View  12/24/2011  *RADIOLOGY REPORT*  Clinical Data: History of heart surgery 1 month ago, follow-up  CHEST - 2 VIEW  Comparison: Chest x-ray of 12/05/2011 andCT angio chest of the same date  Findings: The pleural effusions have resolved.  No active infiltrate is seen.  Mediastinal contours are stable.  Mild cardiomegaly is stable.  No bony abnormality is seen.  IMPRESSION: No active lung disease.  Resolution of small pleural effusions.  Original Report Authenticated By: Juline Patch, M.D.      Recent Labs: Lab Results  Component Value Date   WBC 6.8 12/05/2011   HGB 11.2* 12/05/2011   HCT 33.0* 12/05/2011   PLT 166 12/05/2011   GLUCOSE 129* 12/05/2011   CHOL 179 11/27/2011   TRIG 93 11/27/2011   HDL 60 11/27/2011   LDLCALC 100* 11/27/2011   ALT 27 11/29/2011   AST 31 11/29/2011   NA 139 12/05/2011   K 4.3 12/05/2011    CL 101 12/05/2011   CREATININE 0.80 12/05/2011   BUN 9 12/05/2011   CO2 29 12/02/2011   TSH 3.747 11/29/2011   INR 1.32 11/27/2011   HGBA1C 8.4* 11/25/2011      Assessment / Plan:    Stable post op, shoulder pain almost gone No angina or Heart Failure To start cardiac rehab       Delight Ovens MD 12/24/2011 3:55 PM

## 2011-12-24 NOTE — Patient Instructions (Signed)
Start Cardiac Rehab  Coronary Artery Bypass Grafting  Care After  Refer to this sheet in the next few weeks. These instructions provide you with information on caring for yourself after your procedure. Your caregiver may also give you more specific instructions. Your treatment has been planned according to current medical practices, but problems sometimes occur. Call your caregiver if you have any problems or questions after your procedure.  Recovery from open heart surgery will be different for everyone. Some people feel well after 3 or 4 weeks, while for others it takes longer. After heart surgery, it may be normal to:  Not have an appetite, feel nauseated by the smell of food, or only want to eat a small amount.   Be constipated because of changes in your diet, activity, and medicines. Eat foods high in fiber. Add fresh fruits and vegetables to your diet. Stool softeners may be helpful.   Feel sad or unhappy. You may be frustrated or cranky. You may have good days and bad days. Do not give up. Talk to your caregiver if you do not feel better.   Feel weakness and fatigue. You many need physical therapy or cardiac rehabilitation to get your strength back.   Develop an irregular heartbeat called atrial fibrillation. Symptoms of atrial fibrillation are a fast, irregular heartbeat or feelings of fluttery heartbeats, shortness of breath, low blood pressure, and dizziness. If these symptoms develop, see your caregiver right away.  MEDICATION  Have a list of all the medicines you will be taking when you leave the hospital. For every medicine, know the following:   Name.   Exact dose.   Time of day to be taken.   How often it should be taken.   Why you are taking it.   Ask which medicines should or should not be taken together. If you take more than one heart medicine, ask if it is okay to take them together. Some heart medicines should not be taken at the same time because they may lower  your blood pressure too much.   Narcotic pain medicine can cause constipation. Eat fresh fruits and vegetables. Add fiber to your diet. Stool softener medicine may help relieve constipation.   Keep a copy of your medicines with you at all times.   Do not add or stop taking any medicine until you check with your caregiver.   Medicines can have side effects. Call your caregiver who prescribed the medicine if you:   Start throwing up, have diarrhea, or have stomach pain.   Feel dizzy or lightheaded when you stand up.   Feel your heart is skipping beats or is beating too fast or too slow.   Develop a rash.   Notice unusual bruising or bleeding.  HOME CARE INSTRUCTIONS  After heart surgery, it is important to learn how to take your pulse. Have your caregiver show you how to take your pulse.   Use your incentive spirometer. Ask your caregiver how long after surgery you need to use it.  Care of your chest incision  Tell your caregiver right away if you notice clicking in your chest (sternum).   Support your chest with a pillow or your arms when you take deep breaths and cough.   Follow your caregiver's instructions about when you can bathe or swim.   Protect your incision from sunlight during the first year to keep the scar from getting dark.   Tell your caregiver if you notice:   Increased tenderness of  your incision.   Increased redness or swelling around your incision.   Drainage or pus from your incision.  Care of your leg incision(s)  Avoid crossing your legs.   Avoid sitting for long periods of time. Change positions every half hour.   Elevate your leg(s) when you are sitting.   Check your leg(s) daily for swelling. Check the incisions for redness or drainage.   Diet is very important to heart health.   Eat plenty of fresh fruits and vegetables. Meats should be lean cut. Avoid canned, processed, and fried foods.   Talk to a dietician. They can teach you how to  make healthy food and drink choices.  Weight  Weigh yourself every day. This is important because it helps to know if you are retaining fluid that may make your heart and lungs work harder.   Use the same scale each time.   Weigh yourself every morning at the same time. You should do this after you go to the bathroom, but before you eat breakfast.   Your weight will be more accurate if you do not wear any clothes.   Record your weight.   Tell your caregiver if you have gained 2 pounds or more overnight.  Activity Stop any activity at once if you have chest pain, shortness of breath, irregular heartbeats, or dizziness. Get help right away if you have any of these symptoms.  Bathing.  Avoid soaking in a bath or hot tub until your incisions are healed.   Rest. You need a balance of rest and activity.   Exercise. Exercise per your caregiver's advice. You may need physical therapy or cardiac rehabilitation to help strengthen your muscles and build your endurance.   Climbing stairs. Unless your caregiver tells you not to climb stairs, go up stairs slowly and rest if you tire. Do not pull yourself up by the handrail.   Driving a car. Follow your caregiver's advice on when you may drive. You may ride as a passenger at any time. When traveling for long periods of time in a car, get out of the car and walk around for a few minutes every 2 hours.   Lifting. Avoid lifting, pushing, or pulling anything heavier than 10 pounds for 6 weeks after surgery or as told by your caregiver.   Returning to work. Check with your caregiver. People heal at different rates. Most people will be able to go back to work 6 to 12 weeks after surgery.   Sexual activity. You may resume sexual relations as told by your caregiver.  SEEK MEDICAL CARE IF:  Any of your incisions are red, painful, or have any type of drainage coming from them.   You have an oral temperature above 101.5 F .   You have ankle or leg  swelling.   You have pain in your legs.   You have weight gain of 2 or more pounds a day.   You feel dizzy or lightheaded when you stand up.  SEEK IMMEDIATE MEDICAL CARE IF:  You have angina or chest pain that goes to your jaw or arms. Call your local emergency services right away.   You have shortness of breath at rest or with activity.   You have a fast or irregular heartbeat (arrhythmia).   There is a "clicking" in your sternum when you move.   You have numbness or weakness in your arms or legs.  MAKE SURE YOU:  Understand these instructions.   Will watch your  condition.   Will get help right away if you are not doing well or get worse.

## 2011-12-25 ENCOUNTER — Other Ambulatory Visit: Payer: Self-pay | Admitting: Cardiothoracic Surgery

## 2011-12-25 DIAGNOSIS — I2510 Atherosclerotic heart disease of native coronary artery without angina pectoris: Secondary | ICD-10-CM

## 2011-12-26 ENCOUNTER — Encounter (HOSPITAL_COMMUNITY): Payer: Self-pay

## 2011-12-26 ENCOUNTER — Ambulatory Visit: Payer: Self-pay | Admitting: Cardiothoracic Surgery

## 2011-12-26 ENCOUNTER — Encounter (HOSPITAL_COMMUNITY)
Admission: RE | Admit: 2011-12-26 | Discharge: 2011-12-26 | Disposition: A | Discharge status: Home / Self Care | Payer: BC Managed Care – PPO | Source: Ambulatory Visit | Attending: Cardiology | Admitting: Cardiology

## 2011-12-26 DIAGNOSIS — E119 Type 2 diabetes mellitus without complications: Secondary | ICD-10-CM | POA: Insufficient documentation

## 2011-12-26 DIAGNOSIS — Z951 Presence of aortocoronary bypass graft: Secondary | ICD-10-CM | POA: Insufficient documentation

## 2011-12-26 DIAGNOSIS — I2510 Atherosclerotic heart disease of native coronary artery without angina pectoris: Secondary | ICD-10-CM | POA: Insufficient documentation

## 2011-12-26 DIAGNOSIS — Z5189 Encounter for other specified aftercare: Secondary | ICD-10-CM | POA: Insufficient documentation

## 2011-12-26 DIAGNOSIS — I252 Old myocardial infarction: Secondary | ICD-10-CM | POA: Insufficient documentation

## 2011-12-26 DIAGNOSIS — E039 Hypothyroidism, unspecified: Secondary | ICD-10-CM | POA: Insufficient documentation

## 2011-12-26 DIAGNOSIS — Z7982 Long term (current) use of aspirin: Secondary | ICD-10-CM | POA: Insufficient documentation

## 2011-12-26 DIAGNOSIS — E7800 Pure hypercholesterolemia, unspecified: Secondary | ICD-10-CM | POA: Insufficient documentation

## 2011-12-26 DIAGNOSIS — I10 Essential (primary) hypertension: Secondary | ICD-10-CM | POA: Insufficient documentation

## 2011-12-26 DIAGNOSIS — Z794 Long term (current) use of insulin: Secondary | ICD-10-CM | POA: Insufficient documentation

## 2011-12-26 DIAGNOSIS — E669 Obesity, unspecified: Secondary | ICD-10-CM | POA: Insufficient documentation

## 2011-12-26 DIAGNOSIS — I200 Unstable angina: Secondary | ICD-10-CM | POA: Insufficient documentation

## 2011-12-30 ENCOUNTER — Ambulatory Visit (HOSPITAL_COMMUNITY): Payer: BC Managed Care – PPO

## 2011-12-30 ENCOUNTER — Encounter (HOSPITAL_COMMUNITY)
Admission: RE | Admit: 2011-12-30 | Discharge: 2011-12-30 | Disposition: A | Discharge status: Home / Self Care | Payer: BC Managed Care – PPO | Source: Ambulatory Visit | Attending: Cardiology | Admitting: Cardiology

## 2011-12-30 LAB — GLUCOSE, CAPILLARY: Glucose-Capillary: 167 mg/dL — ABNORMAL HIGH (ref 70–99)

## 2011-12-30 NOTE — Progress Notes (Signed)
Pt started cardiac rehab today.  Pt tolerated light exercise without difficulty. Denies chest pain or dyspnea.  VSS.  Telemetry-NSR.  Pt oriented to exercise equipment and routine.  Understanding verbalized.  Will continue to monitor the patient throughout  the program.

## 2012-01-01 ENCOUNTER — Ambulatory Visit (HOSPITAL_COMMUNITY): Payer: BC Managed Care – PPO

## 2012-01-01 ENCOUNTER — Encounter (HOSPITAL_COMMUNITY)
Admission: RE | Admit: 2012-01-01 | Discharge: 2012-01-01 | Disposition: A | Discharge status: Home / Self Care | Payer: BC Managed Care – PPO | Source: Ambulatory Visit | Attending: Cardiology | Admitting: Cardiology

## 2012-01-01 LAB — GLUCOSE, CAPILLARY
Glucose-Capillary: 72 mg/dL (ref 70–99)
Glucose-Capillary: 99 mg/dL (ref 70–99)

## 2012-01-03 ENCOUNTER — Encounter (HOSPITAL_COMMUNITY): Admission: RE | Admit: 2012-01-03 | Payer: BC Managed Care – PPO | Source: Ambulatory Visit

## 2012-01-03 ENCOUNTER — Ambulatory Visit (HOSPITAL_COMMUNITY): Payer: BC Managed Care – PPO

## 2012-01-06 ENCOUNTER — Ambulatory Visit (HOSPITAL_COMMUNITY): Payer: BC Managed Care – PPO

## 2012-01-06 ENCOUNTER — Encounter (HOSPITAL_COMMUNITY)
Admission: RE | Admit: 2012-01-06 | Discharge: 2012-01-06 | Disposition: A | Discharge status: Home / Self Care | Payer: BC Managed Care – PPO | Source: Ambulatory Visit | Attending: Cardiology | Admitting: Cardiology

## 2012-01-06 LAB — GLUCOSE, CAPILLARY: Glucose-Capillary: 132 mg/dL — ABNORMAL HIGH (ref 70–99)

## 2012-01-08 ENCOUNTER — Encounter (HOSPITAL_COMMUNITY)
Admission: RE | Admit: 2012-01-08 | Discharge: 2012-01-08 | Disposition: A | Discharge status: Home / Self Care | Payer: BC Managed Care – PPO | Source: Ambulatory Visit | Attending: Cardiology | Admitting: Cardiology

## 2012-01-08 ENCOUNTER — Ambulatory Visit (HOSPITAL_COMMUNITY): Payer: BC Managed Care – PPO

## 2012-01-08 LAB — GLUCOSE, CAPILLARY: Glucose-Capillary: 181 mg/dL — ABNORMAL HIGH (ref 70–99)

## 2012-01-08 NOTE — Progress Notes (Signed)
Denise Cuevas has an appointment with Dr Sharl Ma today.  Pre exercise  CBG 181 post exercise CBG 90.  Patient given lemonade.  [name] left cardiac rehab without symptoms or complaints.

## 2012-01-08 NOTE — Progress Notes (Signed)
Denise Cuevas 61 y.o. female       Nutrition Screen                                                                    YES  NO Do you live in a nursing home?  X   Do you eat out more than 3 times/week?   X  If yes, how many times per week do you eat out?  Do you have food allergies?   X If yes, what are you allergic to?  Have you gained or lost more than 10 lbs without trying?              X  If yes, how much weight have you lost and over what time period? 10 lbs gained or lost over 1 month  Do you want to lose weight?    X  If yes, what is a goal weight or amount of weight you would like to lose? 10 lbs  Do you eat alone most of the time?   X   Do you eat less than 2 meals/day?  X If yes, how many meals do you eat?  Do you drink more than 3 alcohol drinks/day?  X If yes, how many drinks per day?  Are you having trouble with constipation? * X  If yes, what are you doing to help relieve constipation?  Do you have financial difficulties with buying food?*    X   Are you experiencing regular nausea/ vomiting?*     X   Do you have a poor appetite? *                                        X   Do you have trouble chewing/swallowing? *   X    Pt with diagnoses of:  X CABG              X GERD          X Dyslipidemia  / HDL< 40 / LDL>70 / High TG      X %  Body fat >goal / Body Mass Index >25 X MI X HTN        X DM/A1c >6 / CBG >126       Pt Risk Score   8       Diagnosis Risk Score  80       Total Risk Score   88                        X High Risk                Low Risk              HT: 62.5" Ht Readings from Last 1 Encounters:  12/26/11 5' 2.5" (1.588 m)    WT:   156.6 lb (71.2 kg) Wt Readings from Last 3 Encounters:  12/26/11 156 lb 15.5 oz (71.2 kg)  12/24/11 168 lb (76.204 kg)  12/09/11 168 lb (76.204 kg)     IBW 51.1 139%IBW BMI 28.3 39.3%body fat   Meds  reviewed: Metformin ER, Lantus, vitamin D  Past Medical History  Diagnosis Date  . Hypertension   .  Hypothyroid   . Fibromyalgia   . Hypercholesteremia   . Angina   . Coronary artery disease   . GERD (gastroesophageal reflux disease)   . Headache   . Depression   . H/O hiatal hernia   . Asthma   . Diabetes mellitus         Activity level: Pt is active  Wt goal: 132-144 lb ( 60-65.5 kg) Current tobacco use? No      Food/Drug Interaction? No      Labs:  Lipid Panel     Component Value Date/Time   CHOL 179 11/27/2011 0537   TRIG 93 11/27/2011 0537   HDL 60 11/27/2011 0537   CHOLHDL 3.0 11/27/2011 0537   VLDL 19 11/27/2011 0537   LDLCALC 100* 11/27/2011 0537   Lab Results  Component Value Date   HGBA1C 8.4* 11/25/2011   12/05/11 Glucose 129   LDL goal: < 70      MI, DM and > 2: HTN, family h/o, >59 yo female   Estimated Daily Nutrition Needs for: ? wt loss  1250-1500 Kcal , Total Fat 35-40gm, Saturated Fat 9-12 gm, Trans Fat 1.2-1.5 gm,  Sodium less than 1500 mg, Grams of CHO 150-175

## 2012-01-10 ENCOUNTER — Encounter (HOSPITAL_COMMUNITY)
Admission: RE | Admit: 2012-01-10 | Discharge: 2012-01-10 | Disposition: A | Discharge status: Home / Self Care | Payer: BC Managed Care – PPO | Source: Ambulatory Visit | Attending: Cardiology | Admitting: Cardiology

## 2012-01-10 ENCOUNTER — Ambulatory Visit (HOSPITAL_COMMUNITY): Payer: BC Managed Care – PPO

## 2012-01-10 DIAGNOSIS — I2510 Atherosclerotic heart disease of native coronary artery without angina pectoris: Secondary | ICD-10-CM | POA: Insufficient documentation

## 2012-01-10 DIAGNOSIS — E119 Type 2 diabetes mellitus without complications: Secondary | ICD-10-CM | POA: Insufficient documentation

## 2012-01-10 DIAGNOSIS — Z951 Presence of aortocoronary bypass graft: Secondary | ICD-10-CM | POA: Insufficient documentation

## 2012-01-10 DIAGNOSIS — Z5189 Encounter for other specified aftercare: Secondary | ICD-10-CM | POA: Insufficient documentation

## 2012-01-10 DIAGNOSIS — I252 Old myocardial infarction: Secondary | ICD-10-CM | POA: Insufficient documentation

## 2012-01-10 DIAGNOSIS — I200 Unstable angina: Secondary | ICD-10-CM | POA: Insufficient documentation

## 2012-01-10 DIAGNOSIS — Z794 Long term (current) use of insulin: Secondary | ICD-10-CM | POA: Insufficient documentation

## 2012-01-10 DIAGNOSIS — E039 Hypothyroidism, unspecified: Secondary | ICD-10-CM | POA: Insufficient documentation

## 2012-01-10 DIAGNOSIS — I10 Essential (primary) hypertension: Secondary | ICD-10-CM | POA: Insufficient documentation

## 2012-01-10 DIAGNOSIS — Z7982 Long term (current) use of aspirin: Secondary | ICD-10-CM | POA: Insufficient documentation

## 2012-01-10 DIAGNOSIS — E669 Obesity, unspecified: Secondary | ICD-10-CM | POA: Insufficient documentation

## 2012-01-10 DIAGNOSIS — E7800 Pure hypercholesterolemia, unspecified: Secondary | ICD-10-CM | POA: Insufficient documentation

## 2012-01-13 ENCOUNTER — Encounter (HOSPITAL_COMMUNITY)
Admission: RE | Admit: 2012-01-13 | Discharge: 2012-01-13 | Disposition: A | Discharge status: Home / Self Care | Payer: BC Managed Care – PPO | Source: Ambulatory Visit | Attending: Cardiology | Admitting: Cardiology

## 2012-01-13 ENCOUNTER — Ambulatory Visit (HOSPITAL_COMMUNITY): Payer: BC Managed Care – PPO

## 2012-01-13 LAB — GLUCOSE, CAPILLARY: Glucose-Capillary: 107 mg/dL — ABNORMAL HIGH (ref 70–99)

## 2012-01-15 ENCOUNTER — Ambulatory Visit (HOSPITAL_COMMUNITY): Payer: BC Managed Care – PPO

## 2012-01-15 ENCOUNTER — Encounter (HOSPITAL_COMMUNITY)
Admission: RE | Admit: 2012-01-15 | Discharge: 2012-01-15 | Disposition: A | Discharge status: Home / Self Care | Payer: BC Managed Care – PPO | Source: Ambulatory Visit | Attending: Cardiology | Admitting: Cardiology

## 2012-01-15 LAB — GLUCOSE, CAPILLARY
Glucose-Capillary: 105 mg/dL — ABNORMAL HIGH (ref 70–99)
Glucose-Capillary: 75 mg/dL (ref 70–99)
Glucose-Capillary: 104 mg/dL — ABNORMAL HIGH (ref 70–99)

## 2012-01-15 NOTE — Progress Notes (Addendum)
Denise Cuevas 61 y.o. female Nutrition Note Spoke with pt.  Nutrition Plan and Nutrition Survey reviewed with pt. Pt is following Step 2 of the Therapeutic Lifestyle Changes diet. Pt reports h/o 80 lb wt loss 2 times in her life.  Pt successfully lost wt on Optifast and Wt Watchers, but gained the wt back because "I didn't make the lifestyle changes needed to maintain the wt loss."  Pt motivated to keep the wt off by making lifestyle changes. Weight loss tips discussed. Pt has held Lantus x 2 days.  Morning fasting CBG's have been 107 and 92 mg/dL per pt.  Pre-exercise CBG 72 mg/dL within 2 hours of eating lunch.  Pt celebrated son's birthday at lunch and "where he wanted to go there wasn't anything I could eat."  Pt ate 3/4 waffle with regular syrup and the meaty part of 3 strips of bacon.  Pt did walk "a little" before coming to exercise.  Pt states A1c re-checked at Dr. Daune Perch office last week and Dr. Sharl Ma was "pleased and told me I could work on decreasing the Lantus." According to pt meds, pt taking 750 mg Metformin BID.  Pt states she thinks "Dr. Sharl Ma changed the Metformin to 500 mg BID." Pt does not recall A1c #.   Nutrition Diagnosis   Food-and nutrition-related knowledge deficit related to lack of exposure to information as related to diagnosis of: ? CVD ? DM (A1c 8.4)    Overweightrelated to excessive energy intake as evidenced by a BMI of 28.4  Nutrition RX/ Estimated Daily Nutrition Needs for: wt loss  1250-1500 Kcal, 35-40 gm fat, 9-12 gm sat fat, 1.2-1.5 gm trans-fat, <1500 mg sodium , 150 gm CHO   Nutrition Intervention   Pt's individual nutrition plan including cholesterol goals reviewed with pt.   Benefits of adopting Therapeutic Lifestyle Changes discussed when [employer] reviewed.   Dr. Sharl Ma faxed re: updated A1c   Pt to continue to hold Lantus and monitor CBG's with team   Pt to attend the Portion Distortion class   Pt to attend the  ? Nutrition I class - met, 01/14/12               ? Nutrition II class - met, 01/07/12          ? Diabetes Blitz class         ? Diabetes Q & A class   Pt given handouts for: ? wt loss ? DM    Continue client-centered nutrition education by RD, as part of interdisciplinary care. Goal(s)   Pt to identify food quantities necessary to achieve: ? wt loss to a goal wt of 132-144 lb (60-65.5 kg) at graduation from cardiac rehab.    CBG concentrations in the normal range or as close to normal as is safely possible.   Use pre-meal and post-meal CBG's and A1c to determine whether adjustments in food/meal planning will be beneficial or if any meds need to be combined with nutrition therapy. Monitor and Evaluate progress toward nutrition goal with team.

## 2012-01-17 ENCOUNTER — Encounter (HOSPITAL_COMMUNITY)
Admission: RE | Admit: 2012-01-17 | Discharge: 2012-01-17 | Disposition: A | Discharge status: Home / Self Care | Payer: BC Managed Care – PPO | Source: Ambulatory Visit | Attending: Cardiology | Admitting: Cardiology

## 2012-01-17 ENCOUNTER — Ambulatory Visit (HOSPITAL_COMMUNITY): Payer: BC Managed Care – PPO

## 2012-01-17 LAB — GLUCOSE, CAPILLARY: Glucose-Capillary: 267 mg/dL — ABNORMAL HIGH (ref 70–99)

## 2012-01-20 ENCOUNTER — Ambulatory Visit (HOSPITAL_COMMUNITY): Payer: BC Managed Care – PPO

## 2012-01-20 ENCOUNTER — Encounter (HOSPITAL_COMMUNITY)
Admission: RE | Admit: 2012-01-20 | Discharge: 2012-01-20 | Disposition: A | Discharge status: Home / Self Care | Payer: BC Managed Care – PPO | Source: Ambulatory Visit | Attending: Cardiology | Admitting: Cardiology

## 2012-01-20 NOTE — Progress Notes (Signed)
Reviewed home exercise with pt today.  Pt plans to walk and use upright stationary bicycle for exercise.  Reviewed THR, pulse, RPE, sign and symptoms, and when to call 911 or MD.  Pt voiced understanding. Electronically signed by Harriett Sine MS on Monday January 20 2012 at 1415

## 2012-01-22 ENCOUNTER — Encounter (HOSPITAL_COMMUNITY)
Admission: RE | Admit: 2012-01-22 | Discharge: 2012-01-22 | Disposition: A | Discharge status: Home / Self Care | Payer: BC Managed Care – PPO | Source: Ambulatory Visit | Attending: Cardiology | Admitting: Cardiology

## 2012-01-22 ENCOUNTER — Ambulatory Visit (HOSPITAL_COMMUNITY): Payer: BC Managed Care – PPO

## 2012-01-22 LAB — GLUCOSE, CAPILLARY: Glucose-Capillary: 105 mg/dL — ABNORMAL HIGH (ref 70–99)

## 2012-01-24 ENCOUNTER — Encounter (HOSPITAL_COMMUNITY)
Admission: RE | Admit: 2012-01-24 | Discharge: 2012-01-24 | Disposition: A | Discharge status: Home / Self Care | Payer: BC Managed Care – PPO | Source: Ambulatory Visit | Attending: Cardiology | Admitting: Cardiology

## 2012-01-24 ENCOUNTER — Ambulatory Visit (HOSPITAL_COMMUNITY): Payer: BC Managed Care – PPO

## 2012-01-24 LAB — GLUCOSE, CAPILLARY: Glucose-Capillary: 175 mg/dL — ABNORMAL HIGH (ref 70–99)

## 2012-01-27 ENCOUNTER — Encounter (HOSPITAL_COMMUNITY)
Admission: RE | Admit: 2012-01-27 | Discharge: 2012-01-27 | Disposition: A | Discharge status: Home / Self Care | Payer: BC Managed Care – PPO | Source: Ambulatory Visit | Attending: Cardiology | Admitting: Cardiology

## 2012-01-27 ENCOUNTER — Ambulatory Visit (HOSPITAL_COMMUNITY): Payer: BC Managed Care – PPO

## 2012-01-27 LAB — GLUCOSE, CAPILLARY
Glucose-Capillary: 147 mg/dL — ABNORMAL HIGH (ref 70–99)
Glucose-Capillary: 94 mg/dL (ref 70–99)

## 2012-01-28 NOTE — Progress Notes (Signed)
Pt CBG-71 post exercise.  Pt asymptomatic.  Lemonade (15gm carb) and graham crackers given.  CBG up to 94 post snack. Pt reports she is not taking lantus qpm however she is currently taking metformin 750mg .   Pt has appt with Dr. Jillyn Hidden 01/29/2012. Will fax rehab reports with CBG results to Dr. Jillyn Hidden for review.

## 2012-01-29 ENCOUNTER — Ambulatory Visit (HOSPITAL_COMMUNITY): Payer: BC Managed Care – PPO

## 2012-01-29 ENCOUNTER — Encounter (HOSPITAL_COMMUNITY)
Admission: RE | Admit: 2012-01-29 | Discharge: 2012-01-29 | Disposition: A | Discharge status: Home / Self Care | Payer: BC Managed Care – PPO | Source: Ambulatory Visit | Attending: Cardiology | Admitting: Cardiology

## 2012-01-29 LAB — GLUCOSE, CAPILLARY: Glucose-Capillary: 94 mg/dL (ref 70–99)

## 2012-01-29 NOTE — Progress Notes (Signed)
Pt reports she had appt today with Dr. Jillyn Hidden.  Pt was advised to decreased metformin 750mg  once daily and take synthroid , pending thyroid function studies.

## 2012-01-31 ENCOUNTER — Ambulatory Visit (HOSPITAL_COMMUNITY): Payer: BC Managed Care – PPO

## 2012-01-31 ENCOUNTER — Encounter (HOSPITAL_COMMUNITY)
Admission: RE | Admit: 2012-01-31 | Discharge: 2012-01-31 | Disposition: A | Discharge status: Home / Self Care | Payer: BC Managed Care – PPO | Source: Ambulatory Visit | Attending: Cardiology | Admitting: Cardiology

## 2012-01-31 LAB — GLUCOSE, CAPILLARY: Glucose-Capillary: 149 mg/dL — ABNORMAL HIGH (ref 70–99)

## 2012-02-03 ENCOUNTER — Ambulatory Visit (HOSPITAL_COMMUNITY): Payer: BC Managed Care – PPO

## 2012-02-03 ENCOUNTER — Encounter (HOSPITAL_COMMUNITY)
Admission: RE | Admit: 2012-02-03 | Discharge: 2012-02-03 | Disposition: A | Discharge status: Home / Self Care | Payer: BC Managed Care – PPO | Source: Ambulatory Visit | Attending: Cardiology | Admitting: Cardiology

## 2012-02-03 LAB — GLUCOSE, CAPILLARY: Glucose-Capillary: 131 mg/dL — ABNORMAL HIGH (ref 70–99)

## 2012-02-05 ENCOUNTER — Ambulatory Visit (HOSPITAL_COMMUNITY): Payer: BC Managed Care – PPO

## 2012-02-05 ENCOUNTER — Encounter (HOSPITAL_COMMUNITY)
Admission: RE | Admit: 2012-02-05 | Discharge: 2012-02-05 | Disposition: A | Discharge status: Home / Self Care | Payer: BC Managed Care – PPO | Source: Ambulatory Visit | Attending: Cardiology | Admitting: Cardiology

## 2012-02-05 NOTE — Progress Notes (Addendum)
Pt arrived to cardiac rehab today c/o chest discomfort radiating to left arm.  Discomfort started yesterday around 530 pm after pt finished strenous exercise activity on stationary bike at home.  Pt states she pushed herself harder than usual.  Pt states the discomfort started with nausea, chest pain and left arm pain similar to her pre CABG symptoms.  Pt rates pain 3-4/10 now with same quality all day, pt reports discomfort woke her up several times during the night.  Pt admits she had a stressful morning today.  Pt states pain in worsened with deep inspiration and chest burns at incision site when touched.  Pt BP-104/64, hr-80, CBG-250 wt -70.0 up from 68.3 on 2/01/14/12.  Pt repots only minimal relief with O2 4l via [place].  NSR no ectopy.  Pt report episode of nausea and vomiting 5 days ago associated with generalized body aches.  Phone call to Dr. Anne Fu office, per Burna Mortimer, pt advised to come to Dr. Anne Fu office for evaluation.  Will send monitor tracings-jr,rn

## 2012-02-05 NOTE — Progress Notes (Signed)
Pt arrived to cardiac rehab today c/o chest discomfort radiating to left arm.  Discomfort started yesterday around 530 pm after pt finished strenous exercise activity on stationary bike at home.  Pt states she pushed herself harder than usual.  Pt states the discomfort started with nausea, chest pain and left arm pain similar to her pre CABG symptoms.  Pt rates pain 3-4/10 now with same quality all day, pt reports discomfort woke her up several times during the night.  Pt admits she had a stressful morning today.  Pt states pain in worsened with deep inspiration and chest burns at incision site when touched.  Pt BP-104/64, hr-80, CBG-250 wt -70.0 up from 68.3 on 2/01/14/12.  Pt repots only minimal relief with O2 4l via [name].  NSR no ectopy.  Pt report episode of nausea and vomiting 5 days ago associated with generalized body aches.  Phone call to Dr. Skains office, per Wanda, pt advised to come to Dr. Skains office for evaluation.  Will send monitor tracings-jr,rn 

## 2012-02-07 ENCOUNTER — Encounter (HOSPITAL_COMMUNITY): Admission: RE | Admit: 2012-02-07 | Payer: BC Managed Care – PPO | Source: Ambulatory Visit

## 2012-02-07 ENCOUNTER — Ambulatory Visit (HOSPITAL_COMMUNITY): Payer: BC Managed Care – PPO

## 2012-02-10 ENCOUNTER — Encounter (HOSPITAL_COMMUNITY)
Admission: RE | Admit: 2012-02-10 | Discharge: 2012-02-10 | Disposition: A | Discharge status: Home / Self Care | Payer: BC Managed Care – PPO | Source: Ambulatory Visit | Attending: Cardiology | Admitting: Cardiology

## 2012-02-10 ENCOUNTER — Ambulatory Visit (HOSPITAL_COMMUNITY): Payer: BC Managed Care – PPO

## 2012-02-10 DIAGNOSIS — I252 Old myocardial infarction: Secondary | ICD-10-CM | POA: Insufficient documentation

## 2012-02-10 DIAGNOSIS — E669 Obesity, unspecified: Secondary | ICD-10-CM | POA: Insufficient documentation

## 2012-02-10 DIAGNOSIS — Z5189 Encounter for other specified aftercare: Secondary | ICD-10-CM | POA: Insufficient documentation

## 2012-02-10 DIAGNOSIS — E119 Type 2 diabetes mellitus without complications: Secondary | ICD-10-CM | POA: Insufficient documentation

## 2012-02-10 DIAGNOSIS — Z7982 Long term (current) use of aspirin: Secondary | ICD-10-CM | POA: Insufficient documentation

## 2012-02-10 DIAGNOSIS — E039 Hypothyroidism, unspecified: Secondary | ICD-10-CM | POA: Insufficient documentation

## 2012-02-10 DIAGNOSIS — Z794 Long term (current) use of insulin: Secondary | ICD-10-CM | POA: Insufficient documentation

## 2012-02-10 DIAGNOSIS — Z951 Presence of aortocoronary bypass graft: Secondary | ICD-10-CM | POA: Insufficient documentation

## 2012-02-10 DIAGNOSIS — I2510 Atherosclerotic heart disease of native coronary artery without angina pectoris: Secondary | ICD-10-CM | POA: Insufficient documentation

## 2012-02-10 DIAGNOSIS — E7800 Pure hypercholesterolemia, unspecified: Secondary | ICD-10-CM | POA: Insufficient documentation

## 2012-02-10 DIAGNOSIS — I200 Unstable angina: Secondary | ICD-10-CM | POA: Insufficient documentation

## 2012-02-10 DIAGNOSIS — I10 Essential (primary) hypertension: Secondary | ICD-10-CM | POA: Insufficient documentation

## 2012-02-10 LAB — GLUCOSE, CAPILLARY: Glucose-Capillary: 129 mg/dL — ABNORMAL HIGH (ref 70–99)

## 2012-02-12 ENCOUNTER — Ambulatory Visit (HOSPITAL_COMMUNITY): Payer: BC Managed Care – PPO

## 2012-02-12 ENCOUNTER — Encounter (HOSPITAL_COMMUNITY)
Admission: RE | Admit: 2012-02-12 | Discharge: 2012-02-12 | Disposition: A | Discharge status: Home / Self Care | Payer: BC Managed Care – PPO | Source: Ambulatory Visit | Attending: Cardiology | Admitting: Cardiology

## 2012-02-12 LAB — GLUCOSE, CAPILLARY: Glucose-Capillary: 120 mg/dL — ABNORMAL HIGH (ref 70–99)

## 2012-02-13 LAB — GLUCOSE, CAPILLARY: Glucose-Capillary: 96 mg/dL (ref 70–99)

## 2012-02-14 ENCOUNTER — Encounter (HOSPITAL_COMMUNITY)
Admission: RE | Admit: 2012-02-14 | Discharge: 2012-02-14 | Disposition: A | Discharge status: Home / Self Care | Payer: BC Managed Care – PPO | Source: Ambulatory Visit | Attending: Cardiology | Admitting: Cardiology

## 2012-02-14 ENCOUNTER — Other Ambulatory Visit: Payer: Self-pay | Admitting: Gastroenterology

## 2012-02-14 ENCOUNTER — Ambulatory Visit (HOSPITAL_COMMUNITY): Payer: BC Managed Care – PPO

## 2012-02-14 LAB — GLUCOSE, CAPILLARY
Glucose-Capillary: 133 mg/dL — ABNORMAL HIGH (ref 70–99)
Glucose-Capillary: 72 mg/dL (ref 70–99)

## 2012-02-17 ENCOUNTER — Ambulatory Visit (HOSPITAL_COMMUNITY): Payer: BC Managed Care – PPO

## 2012-02-17 ENCOUNTER — Encounter (HOSPITAL_COMMUNITY)
Admission: RE | Admit: 2012-02-17 | Discharge: 2012-02-17 | Disposition: A | Discharge status: Home / Self Care | Payer: BC Managed Care – PPO | Source: Ambulatory Visit | Attending: Cardiology | Admitting: Cardiology

## 2012-02-17 LAB — GLUCOSE, CAPILLARY
Glucose-Capillary: 187 mg/dL — ABNORMAL HIGH (ref 70–99)
Glucose-Capillary: 87 mg/dL (ref 70–99)
Glucose-Capillary: 108 mg/dL — ABNORMAL HIGH (ref 70–99)

## 2012-02-19 ENCOUNTER — Encounter (HOSPITAL_COMMUNITY)
Admission: RE | Admit: 2012-02-19 | Discharge: 2012-02-19 | Disposition: A | Discharge status: Home / Self Care | Payer: BC Managed Care – PPO | Source: Ambulatory Visit | Attending: Cardiology | Admitting: Cardiology

## 2012-02-19 ENCOUNTER — Ambulatory Visit (HOSPITAL_COMMUNITY): Payer: BC Managed Care – PPO

## 2012-02-19 ENCOUNTER — Ambulatory Visit
Admission: RE | Admit: 2012-02-19 | Discharge: 2012-02-19 | Disposition: A | Discharge status: Home / Self Care | Payer: BC Managed Care – PPO | Source: Ambulatory Visit | Attending: Gastroenterology | Admitting: Gastroenterology

## 2012-02-21 ENCOUNTER — Ambulatory Visit (HOSPITAL_COMMUNITY): Payer: BC Managed Care – PPO

## 2012-02-21 ENCOUNTER — Encounter (HOSPITAL_COMMUNITY)
Admission: RE | Admit: 2012-02-21 | Discharge: 2012-02-21 | Disposition: A | Discharge status: Home / Self Care | Payer: BC Managed Care – PPO | Source: Ambulatory Visit | Attending: Cardiology | Admitting: Cardiology

## 2012-02-24 ENCOUNTER — Ambulatory Visit (HOSPITAL_COMMUNITY): Payer: BC Managed Care – PPO

## 2012-02-24 ENCOUNTER — Encounter (HOSPITAL_COMMUNITY)
Admission: RE | Admit: 2012-02-24 | Discharge: 2012-02-24 | Disposition: A | Discharge status: Home / Self Care | Payer: BC Managed Care – PPO | Source: Ambulatory Visit | Attending: Cardiology | Admitting: Cardiology

## 2012-02-24 LAB — GLUCOSE, CAPILLARY: Glucose-Capillary: 121 mg/dL — ABNORMAL HIGH (ref 70–99)

## 2012-02-26 ENCOUNTER — Ambulatory Visit (HOSPITAL_COMMUNITY): Payer: BC Managed Care – PPO

## 2012-02-26 ENCOUNTER — Encounter (HOSPITAL_COMMUNITY)
Admission: RE | Admit: 2012-02-26 | Discharge: 2012-02-26 | Disposition: A | Discharge status: Home / Self Care | Payer: BC Managed Care – PPO | Source: Ambulatory Visit | Attending: Cardiology | Admitting: Cardiology

## 2012-02-26 LAB — GLUCOSE, CAPILLARY
Glucose-Capillary: 206 mg/dL — ABNORMAL HIGH (ref 70–99)
Glucose-Capillary: 165 mg/dL — ABNORMAL HIGH (ref 70–99)

## 2012-02-28 ENCOUNTER — Encounter (HOSPITAL_COMMUNITY)
Admission: RE | Admit: 2012-02-28 | Discharge: 2012-02-28 | Disposition: A | Discharge status: Home / Self Care | Payer: BC Managed Care – PPO | Source: Ambulatory Visit | Attending: Cardiology | Admitting: Cardiology

## 2012-02-28 ENCOUNTER — Ambulatory Visit (HOSPITAL_COMMUNITY): Payer: BC Managed Care – PPO

## 2012-02-28 LAB — GLUCOSE, CAPILLARY
Glucose-Capillary: 287 mg/dL — ABNORMAL HIGH (ref 70–99)
Glucose-Capillary: 123 mg/dL — ABNORMAL HIGH (ref 70–99)

## 2012-03-02 ENCOUNTER — Ambulatory Visit (HOSPITAL_COMMUNITY): Payer: BC Managed Care – PPO

## 2012-03-02 ENCOUNTER — Encounter (HOSPITAL_COMMUNITY): Payer: BC Managed Care – PPO

## 2012-03-04 ENCOUNTER — Encounter (HOSPITAL_COMMUNITY)
Admission: RE | Admit: 2012-03-04 | Discharge: 2012-03-04 | Disposition: A | Discharge status: Home / Self Care | Payer: BC Managed Care – PPO | Source: Ambulatory Visit | Attending: Cardiology | Admitting: Cardiology

## 2012-03-04 ENCOUNTER — Ambulatory Visit (HOSPITAL_COMMUNITY): Payer: BC Managed Care – PPO

## 2012-03-04 LAB — GLUCOSE, CAPILLARY: Glucose-Capillary: 267 mg/dL — ABNORMAL HIGH (ref 70–99)

## 2012-03-06 ENCOUNTER — Ambulatory Visit (HOSPITAL_COMMUNITY): Payer: BC Managed Care – PPO

## 2012-03-06 ENCOUNTER — Encounter (HOSPITAL_COMMUNITY)
Admission: RE | Admit: 2012-03-06 | Discharge: 2012-03-06 | Disposition: A | Discharge status: Home / Self Care | Payer: BC Managed Care – PPO | Source: Ambulatory Visit | Attending: Cardiology | Admitting: Cardiology

## 2012-03-09 ENCOUNTER — Ambulatory Visit (HOSPITAL_COMMUNITY): Payer: BC Managed Care – PPO

## 2012-03-09 ENCOUNTER — Encounter (HOSPITAL_COMMUNITY)
Admission: RE | Admit: 2012-03-09 | Discharge: 2012-03-09 | Disposition: A | Discharge status: Home / Self Care | Payer: BC Managed Care – PPO | Source: Ambulatory Visit | Attending: Cardiology | Admitting: Cardiology

## 2012-03-09 DIAGNOSIS — Z5189 Encounter for other specified aftercare: Secondary | ICD-10-CM | POA: Insufficient documentation

## 2012-03-09 DIAGNOSIS — I200 Unstable angina: Secondary | ICD-10-CM | POA: Insufficient documentation

## 2012-03-09 DIAGNOSIS — Z794 Long term (current) use of insulin: Secondary | ICD-10-CM | POA: Insufficient documentation

## 2012-03-09 DIAGNOSIS — Z951 Presence of aortocoronary bypass graft: Secondary | ICD-10-CM | POA: Insufficient documentation

## 2012-03-09 DIAGNOSIS — E039 Hypothyroidism, unspecified: Secondary | ICD-10-CM | POA: Insufficient documentation

## 2012-03-09 DIAGNOSIS — E119 Type 2 diabetes mellitus without complications: Secondary | ICD-10-CM | POA: Insufficient documentation

## 2012-03-09 DIAGNOSIS — I252 Old myocardial infarction: Secondary | ICD-10-CM | POA: Insufficient documentation

## 2012-03-09 DIAGNOSIS — E669 Obesity, unspecified: Secondary | ICD-10-CM | POA: Insufficient documentation

## 2012-03-09 DIAGNOSIS — E7800 Pure hypercholesterolemia, unspecified: Secondary | ICD-10-CM | POA: Insufficient documentation

## 2012-03-09 DIAGNOSIS — Z7982 Long term (current) use of aspirin: Secondary | ICD-10-CM | POA: Insufficient documentation

## 2012-03-09 DIAGNOSIS — I10 Essential (primary) hypertension: Secondary | ICD-10-CM | POA: Insufficient documentation

## 2012-03-09 DIAGNOSIS — I2510 Atherosclerotic heart disease of native coronary artery without angina pectoris: Secondary | ICD-10-CM | POA: Insufficient documentation

## 2012-03-11 ENCOUNTER — Encounter (HOSPITAL_COMMUNITY)
Admission: RE | Admit: 2012-03-11 | Discharge: 2012-03-11 | Disposition: A | Discharge status: Home / Self Care | Payer: BC Managed Care – PPO | Source: Ambulatory Visit | Attending: Cardiology | Admitting: Cardiology

## 2012-03-11 ENCOUNTER — Ambulatory Visit (HOSPITAL_COMMUNITY): Payer: BC Managed Care – PPO

## 2012-03-13 ENCOUNTER — Ambulatory Visit (HOSPITAL_COMMUNITY): Payer: BC Managed Care – PPO

## 2012-03-13 ENCOUNTER — Encounter (HOSPITAL_COMMUNITY)
Admission: RE | Admit: 2012-03-13 | Discharge: 2012-03-13 | Disposition: A | Discharge status: Home / Self Care | Payer: BC Managed Care – PPO | Source: Ambulatory Visit | Attending: Cardiology | Admitting: Cardiology

## 2012-03-16 ENCOUNTER — Ambulatory Visit (HOSPITAL_COMMUNITY): Payer: BC Managed Care – PPO

## 2012-03-16 ENCOUNTER — Encounter (HOSPITAL_COMMUNITY)
Admission: RE | Admit: 2012-03-16 | Discharge: 2012-03-16 | Disposition: A | Discharge status: Home / Self Care | Payer: BC Managed Care – PPO | Source: Ambulatory Visit | Attending: Cardiology | Admitting: Cardiology

## 2012-03-16 LAB — GLUCOSE, CAPILLARY: Glucose-Capillary: 254 mg/dL — ABNORMAL HIGH (ref 70–99)

## 2012-03-18 ENCOUNTER — Encounter (HOSPITAL_COMMUNITY)
Admission: RE | Admit: 2012-03-18 | Discharge: 2012-03-18 | Disposition: A | Discharge status: Home / Self Care | Payer: BC Managed Care – PPO | Source: Ambulatory Visit | Attending: Cardiology | Admitting: Cardiology

## 2012-03-18 ENCOUNTER — Ambulatory Visit (HOSPITAL_COMMUNITY): Payer: BC Managed Care – PPO

## 2012-03-20 ENCOUNTER — Ambulatory Visit (HOSPITAL_COMMUNITY): Payer: BC Managed Care – PPO

## 2012-03-20 ENCOUNTER — Telehealth (HOSPITAL_COMMUNITY): Payer: Self-pay | Admitting: Cardiac Rehabilitation

## 2012-03-20 ENCOUNTER — Encounter (HOSPITAL_COMMUNITY): Payer: BC Managed Care – PPO

## 2012-03-23 ENCOUNTER — Ambulatory Visit (HOSPITAL_COMMUNITY): Payer: BC Managed Care – PPO

## 2012-03-23 ENCOUNTER — Encounter (HOSPITAL_COMMUNITY): Payer: BC Managed Care – PPO

## 2012-03-24 ENCOUNTER — Ambulatory Visit (HOSPITAL_COMMUNITY): Admit: 2012-03-24 | Payer: Self-pay | Admitting: Interventional Cardiology

## 2012-03-24 ENCOUNTER — Encounter (HOSPITAL_COMMUNITY)
Admission: EM | Disposition: A | Discharge status: Home / Self Care | Payer: Self-pay | Source: Home / Self Care | Attending: Interventional Cardiology

## 2012-03-24 ENCOUNTER — Emergency Department (HOSPITAL_COMMUNITY): Payer: BC Managed Care – PPO

## 2012-03-24 ENCOUNTER — Encounter (HOSPITAL_COMMUNITY): Payer: Self-pay | Admitting: Emergency Medicine

## 2012-03-24 ENCOUNTER — Inpatient Hospital Stay (HOSPITAL_COMMUNITY)
Admission: EM | Admit: 2012-03-24 | Discharge: 2012-03-26 | DRG: 853 | Disposition: A | Discharge status: Home / Self Care | Payer: BC Managed Care – PPO | Attending: Interventional Cardiology | Admitting: Interventional Cardiology

## 2012-03-24 DIAGNOSIS — R739 Hyperglycemia, unspecified: Secondary | ICD-10-CM

## 2012-03-24 DIAGNOSIS — I214 Non-ST elevation (NSTEMI) myocardial infarction: Principal | ICD-10-CM | POA: Diagnosis present

## 2012-03-24 DIAGNOSIS — I200 Unstable angina: Secondary | ICD-10-CM

## 2012-03-24 DIAGNOSIS — Z7982 Long term (current) use of aspirin: Secondary | ICD-10-CM

## 2012-03-24 DIAGNOSIS — I10 Essential (primary) hypertension: Secondary | ICD-10-CM | POA: Diagnosis present

## 2012-03-24 DIAGNOSIS — I25810 Atherosclerosis of coronary artery bypass graft(s) without angina pectoris: Secondary | ICD-10-CM | POA: Diagnosis present

## 2012-03-24 DIAGNOSIS — Z8249 Family history of ischemic heart disease and other diseases of the circulatory system: Secondary | ICD-10-CM

## 2012-03-24 DIAGNOSIS — I2510 Atherosclerotic heart disease of native coronary artery without angina pectoris: Secondary | ICD-10-CM | POA: Diagnosis present

## 2012-03-24 DIAGNOSIS — E785 Hyperlipidemia, unspecified: Secondary | ICD-10-CM | POA: Diagnosis present

## 2012-03-24 DIAGNOSIS — F3289 Other specified depressive episodes: Secondary | ICD-10-CM | POA: Diagnosis present

## 2012-03-24 DIAGNOSIS — Z79899 Other long term (current) drug therapy: Secondary | ICD-10-CM

## 2012-03-24 DIAGNOSIS — E039 Hypothyroidism, unspecified: Secondary | ICD-10-CM | POA: Diagnosis present

## 2012-03-24 DIAGNOSIS — E875 Hyperkalemia: Secondary | ICD-10-CM

## 2012-03-24 DIAGNOSIS — K219 Gastro-esophageal reflux disease without esophagitis: Secondary | ICD-10-CM | POA: Diagnosis present

## 2012-03-24 DIAGNOSIS — E7800 Pure hypercholesterolemia, unspecified: Secondary | ICD-10-CM | POA: Diagnosis present

## 2012-03-24 DIAGNOSIS — I252 Old myocardial infarction: Secondary | ICD-10-CM

## 2012-03-24 DIAGNOSIS — E119 Type 2 diabetes mellitus without complications: Secondary | ICD-10-CM | POA: Diagnosis present

## 2012-03-24 DIAGNOSIS — F329 Major depressive disorder, single episode, unspecified: Secondary | ICD-10-CM | POA: Diagnosis present

## 2012-03-24 HISTORY — DX: Cardiac murmur, unspecified: R01.1

## 2012-03-24 HISTORY — DX: Other specified postprocedural states: R11.2

## 2012-03-24 HISTORY — DX: Acute myocardial infarction, unspecified: I21.9

## 2012-03-24 HISTORY — DX: Other specified postprocedural states: Z98.890

## 2012-03-24 HISTORY — PX: LEFT HEART CATHETERIZATION WITH CORONARY/GRAFT ANGIOGRAM: SHX5450

## 2012-03-24 HISTORY — PX: PERCUTANEOUS CORONARY STENT INTERVENTION (PCI-S): SHX5485

## 2012-03-24 LAB — CBC
Hemoglobin: 13.8 g/dL (ref 12.0–15.0)
Platelets: 129 10*3/uL — ABNORMAL LOW (ref 150–400)
RBC: 4.53 MIL/uL (ref 3.87–5.11)

## 2012-03-24 LAB — BASIC METABOLIC PANEL
BUN: 15 mg/dL (ref 6–23)
CO2: 29 mEq/L (ref 19–32)
Calcium: 9.7 mg/dL (ref 8.4–10.5)
Chloride: 103 mEq/L (ref 96–112)
Creatinine, Ser: 0.62 mg/dL (ref 0.50–1.10)
GFR calc Af Amer: >90 mL/min (ref >90)
GFR calc non Af Amer: >90 mL/min (ref >90)
Glucose, Bld: 325 mg/dL — ABNORMAL HIGH (ref 70–99)
Potassium: 5.2 mEq/L — ABNORMAL HIGH (ref 3.5–5.1)
Sodium: 141 mEq/L (ref 135–145)

## 2012-03-24 LAB — CARDIAC PANEL(CRET KIN+CKTOT+MB+TROPI)
Relative Index: 17.3 — ABNORMAL HIGH (ref 0.0–2.5)
Total CK: 256 U/L — ABNORMAL HIGH (ref 7–177)

## 2012-03-24 LAB — D-DIMER, QUANTITATIVE: D-Dimer, Quant: <0.22 ug/mL-FEU (ref 0.00–0.48)

## 2012-03-24 LAB — POCT I-STAT TROPONIN I: Troponin i, poc: 0.97 ng/mL (ref 0.00–0.08)

## 2012-03-24 LAB — GLUCOSE, CAPILLARY: Glucose-Capillary: 113 mg/dL — ABNORMAL HIGH (ref 70–99)

## 2012-03-24 SURGERY — PERCUTANEOUS CORONARY STENT INTERVENTION (PCI-S)

## 2012-03-24 MED ORDER — BIVALIRUDIN 250 MG IV SOLR
INTRAVENOUS | Status: AC
  Filled 2012-03-24: qty 250

## 2012-03-24 MED ORDER — HEPARIN (PORCINE) IN NACL 100-0.45 UNIT/ML-% IJ SOLN
750.0000 [IU]/h | INTRAMUSCULAR | Status: DC
  Administered 2012-03-24: 750 [IU]/h via INTRAVENOUS
  Filled 2012-03-24: qty 250

## 2012-03-24 MED ORDER — NITROGLYCERIN 0.4 MG SL SUBL
0.4000 mg | SUBLINGUAL_TABLET | SUBLINGUAL | Status: DC | PRN
  Administered 2012-03-24: 0.4 mg via SUBLINGUAL
  Filled 2012-03-24: qty 25

## 2012-03-24 MED ORDER — FENTANYL CITRATE 0.05 MG/ML IJ SOLN
INTRAMUSCULAR | Status: AC
  Filled 2012-03-24: qty 2

## 2012-03-24 MED ORDER — ASPIRIN EC 325 MG PO TBEC: 325.0000 mg | DELAYED_RELEASE_TABLET | Freq: Every day | ORAL | Status: DC

## 2012-03-24 MED ORDER — ONDANSETRON HCL 4 MG/2ML IJ SOLN: 4.0000 mg | Freq: Four times a day (QID) | INTRAMUSCULAR | Status: DC | PRN

## 2012-03-24 MED ORDER — ONDANSETRON HCL 4 MG/2ML IJ SOLN
4.0000 mg | Freq: Once | INTRAMUSCULAR | Status: AC
  Administered 2012-03-24: 4 mg via INTRAVENOUS
  Filled 2012-03-24: qty 2

## 2012-03-24 MED ORDER — DULOXETINE HCL 60 MG PO CPEP: 60.0000 mg | ORAL_CAPSULE | Freq: Every day | ORAL | Status: DC

## 2012-03-24 MED ORDER — METOPROLOL TARTRATE 25 MG PO TABS
25.0000 mg | ORAL_TABLET | Freq: Two times a day (BID) | ORAL | Status: DC
  Administered 2012-03-25 (×3): 25 mg via ORAL
  Filled 2012-03-24 (×5): qty 1

## 2012-03-24 MED ORDER — NITROGLYCERIN 0.2 MG/ML ON CALL CATH LAB
INTRAVENOUS | Status: AC
  Filled 2012-03-24: qty 1

## 2012-03-24 MED ORDER — DEXTROSE 50 % IV SOLN
25.0000 mL | Freq: Once | INTRAVENOUS | Status: AC
  Administered 2012-03-24: 25 mL via INTRAVENOUS
  Filled 2012-03-24: qty 50

## 2012-03-24 MED ORDER — ASPIRIN EC 81 MG PO TBEC: 81.0000 mg | DELAYED_RELEASE_TABLET | Freq: Every day | ORAL | Status: DC

## 2012-03-24 MED ORDER — ACETAMINOPHEN 325 MG PO TABS: 650.0000 mg | ORAL_TABLET | ORAL | Status: DC | PRN

## 2012-03-24 MED ORDER — MORPHINE SULFATE 2 MG/ML IJ SOLN: 1.0000 mg | INTRAMUSCULAR | Status: DC | PRN

## 2012-03-24 MED ORDER — ASPIRIN 81 MG PO CHEW
324.0000 mg | CHEWABLE_TABLET | Freq: Once | ORAL | Status: AC
  Administered 2012-03-24: 324 mg via ORAL
  Filled 2012-03-24: qty 4

## 2012-03-24 MED ORDER — NITROGLYCERIN IN D5W 200-5 MCG/ML-% IV SOLN
3.0000 ug/min | INTRAVENOUS | Status: DC
  Administered 2012-03-25: 5 ug/min via INTRAVENOUS

## 2012-03-24 MED ORDER — HEPARIN BOLUS VIA INFUSION
3500.0000 [IU] | Freq: Once | INTRAVENOUS | Status: AC
  Administered 2012-03-24: 3500 [IU] via INTRAVENOUS

## 2012-03-24 MED ORDER — CEFAZOLIN SODIUM 1-5 GM-% IV SOLN
INTRAVENOUS | Status: AC
  Filled 2012-03-24: qty 50

## 2012-03-24 MED ORDER — MORPHINE SULFATE 2 MG/ML IJ SOLN
2.0000 mg | Freq: Once | INTRAMUSCULAR | Status: AC
  Administered 2012-03-24: 2 mg via INTRAVENOUS
  Filled 2012-03-24: qty 1

## 2012-03-24 MED ORDER — LEVOTHYROXINE SODIUM 125 MCG PO TABS
125.0000 ug | ORAL_TABLET | Freq: Every day | ORAL | Status: DC
  Administered 2012-03-25 – 2012-03-26 (×2): 125 ug via ORAL
  Filled 2012-03-24 (×3): qty 1

## 2012-03-24 MED ORDER — DULOXETINE HCL 30 MG PO CPEP: 30.0000 mg | ORAL_CAPSULE | Freq: Every day | ORAL | Status: DC

## 2012-03-24 MED ORDER — NITROGLYCERIN 0.4 MG SL SUBL: 0.4000 mg | SUBLINGUAL_TABLET | SUBLINGUAL | Status: DC | PRN

## 2012-03-24 MED ORDER — ASPIRIN 81 MG PO CHEW
324.0000 mg | CHEWABLE_TABLET | ORAL | Status: AC
  Administered 2012-03-25: 324 mg via ORAL
  Filled 2012-03-24: qty 4

## 2012-03-24 MED ORDER — TICAGRELOR 90 MG PO TABS
ORAL_TABLET | ORAL | Status: AC
  Filled 2012-03-24: qty 2

## 2012-03-24 MED ORDER — NITROGLYCERIN IN D5W 200-5 MCG/ML-% IV SOLN
5.0000 ug/min | INTRAVENOUS | Status: DC
  Administered 2012-03-24: 5 ug/min via INTRAVENOUS
  Filled 2012-03-24: qty 250

## 2012-03-24 MED ORDER — BUDESONIDE-FORMOTEROL FUMARATE 160-4.5 MCG/ACT IN AERO
2.0000 | INHALATION_SPRAY | Freq: Two times a day (BID) | RESPIRATORY_TRACT | Status: DC | PRN
  Filled 2012-03-24: qty 6

## 2012-03-24 MED ORDER — SODIUM CHLORIDE 0.9 % IV SOLN
INTRAVENOUS | Status: DC
  Administered 2012-03-25: via INTRAVENOUS

## 2012-03-24 MED ORDER — INSULIN ASPART 100 UNIT/ML ~~LOC~~ SOLN
10.0000 [IU] | Freq: Once | SUBCUTANEOUS | Status: AC
  Administered 2012-03-24: 10 [IU] via INTRAVENOUS
  Filled 2012-03-24: qty 1

## 2012-03-24 MED ORDER — LIDOCAINE HCL (PF) 1 % IJ SOLN
INTRAMUSCULAR | Status: AC
  Filled 2012-03-24: qty 30

## 2012-03-24 MED ORDER — PANTOPRAZOLE SODIUM 40 MG PO TBEC
80.0000 mg | DELAYED_RELEASE_TABLET | Freq: Every day | ORAL | Status: DC
  Administered 2012-03-25: 80 mg via ORAL
  Filled 2012-03-24: qty 1

## 2012-03-24 MED ORDER — TICAGRELOR 90 MG PO TABS
90.0000 mg | ORAL_TABLET | Freq: Two times a day (BID) | ORAL | Status: DC
  Administered 2012-03-25 – 2012-03-26 (×3): 90 mg via ORAL
  Filled 2012-03-24 (×4): qty 1

## 2012-03-24 MED ORDER — MIDAZOLAM HCL 2 MG/2ML IJ SOLN
INTRAMUSCULAR | Status: AC
  Filled 2012-03-24: qty 2

## 2012-03-24 MED ORDER — ASPIRIN 81 MG PO CHEW
81.0000 mg | CHEWABLE_TABLET | Freq: Every day | ORAL | Status: DC
  Administered 2012-03-25 – 2012-03-26 (×2): 81 mg via ORAL
  Filled 2012-03-24: qty 1

## 2012-03-24 MED ORDER — HEPARIN (PORCINE) IN NACL 2-0.9 UNIT/ML-% IJ SOLN
INTRAMUSCULAR | Status: AC
  Filled 2012-03-24: qty 2000

## 2012-03-24 MED ORDER — ASPIRIN 300 MG RE SUPP: 300.0000 mg | RECTAL | Status: AC

## 2012-03-24 NOTE — H&P (Signed)
Admit date: 03/24/2012 Referring Physician:  Dr. Karma Ganja  Primary Cardiologist  Dr. Donato Schultz Chief complaint/reason for admission:CHest pain/NSTEMI  HPI: This is a [phone] WF with a history of CAD with NSTEMI 11/2011 s/p CABG, HTN, DM, dyslipidemia and GERD who was in her USOH and had gone to the beach.  About 3 days ago developed SSCP which has been pretty much constant but wax and wane in severity.  She denied any SOB but was nauseated with vomiting to the point that today she threw up bile.  It is located across her chest and down her right arm into her fingers and up into her throat and teeth.  This is identical to her prior MI except that at that time she also had pain in her left arm as well.  Today she was in the shower and her symptoms worsened.  She felt nauseated and thought she was going to throw up and became presyncopal.  She did not want to call EMS at the beach so she got in her car and came back to [place] to the ER. Currently her CP is an 8/10 in severity despite IV Heparin gtt and NTG gtt.      PMH:    Past Medical History  Diagnosis Date  . Hypertension   . Hypothyroid   . Fibromyalgia   . Hypercholesteremia   . Angina   . GERD (gastroesophageal reflux disease)   . Headache   . Depression   . H/O hiatal hernia   . Asthma   . PONV (postoperative nausea and vomiting)   . Heart murmur   . Diabetes mellitus     type 2  . Coronary artery disease     s/p CABG  . Myocardial infarction 11/2011    PSH:    Past Surgical History  Procedure Date  . Cholecystectomy   . Coronary artery bypass graft 11/27/2011    Procedure: CORONARY ARTERY BYPASS GRAFTING (CABG);  Surgeon: Delight Ovens, MD;  Location: [facility] OR;  Service: Open Heart Surgery;  Laterality: N/A;  coronary artery bypass graft on pump times two utilizing left internal mammary artery and right saphenous vein harvested endoscopically - LIMA to LAD and SVG to left circ  . Knee surgery     ALLERGIES:   Byetta and  Crestor  Prior to Admit Meds:   (Not in a hospital admission) Family HX:    Family History  Problem Relation Age of Onset  . Heart disease Father 46    died of MI  . Peripheral vascular disease Mother 74    bilaterial amputations   Social HX:    History   Social History  . Marital Status: Married    Spouse Name: N/A    Number of Children: N/A  . Years of Education: N/A   Occupational History  . Not on file.   Social History Main Topics  . Smoking status: Never Smoker   . Smokeless tobacco: Never Used  . Alcohol Use: No  . Drug Use: No  . Sexually Active: Yes   Other Topics Concern  . Not on file   Social History Narrative   Retired Geophysicist/field seismologist [employer]     ROS:  All 11 ROS were addressed and are negative except what is stated in the HPI  PHYSICAL EXAM Filed Vitals:   03/24/12 1619  BP: 145/70  Pulse: 74  Temp: 98.3 F (36.8 C)  Resp: 20   General: Well developed, well nourished, in no acute distress  Head: Eyes PERRLA, No xanthomas.   Normal cephalic and atramatic  Lungs:   Clear bilaterally to auscultation and percussion. Heart:   HRRR S1 S2 Pulses are 2+ & equal.            No carotid bruit. No JVD.  No abdominal bruits. No femoral bruits. Abdomen: Bowel sounds are positive, abdomen soft and non-tender without masses  Extremities:   No clubbing, cyanosis or edema.  DP +1 Neuro: Alert and oriented X 3. Psych:  Good affect, responds appropriately   Labs:   Lab Results  Component Value Date   WBC 6.7 03/24/2012   HGB 13.8 03/24/2012   HCT 40.1 03/24/2012   MCV 88.5 03/24/2012   PLT 129* 03/24/2012    Lab 03/24/12 1631  NA 141  K 5.2*  CL 103  CO2 29  BUN 15  CREATININE 0.62  CALCIUM 9.7  PROT --  BILITOT --  ALKPHOS --  ALT --  AST --  GLUCOSE 325*   Lab Results  Component Value Date   CKTOTAL 77 11/25/2011   CKMB 9.6* 11/25/2011   TROPONINI 1.58* 11/25/2011   No results found for this basename: PTT   Lab Results  Component Value  Date   INR 1.32 11/27/2011   INR 1.11 11/25/2011   INR 1.05 11/25/2011     Lab Results  Component Value Date   CHOL 179 11/27/2011   Lab Results  Component Value Date   HDL 60 11/27/2011   Lab Results  Component Value Date   LDLCALC 100* 11/27/2011   Lab Results  Component Value Date   TRIG 93 11/27/2011   Lab Results  Component Value Date   CHOLHDL 3.0 11/27/2011   No results found for this basename: LDLDIRECT      Radiology:  *RADIOLOGY REPORT*  Clinical Data: Chest pain  CHEST - 2 VIEW  Comparison: 10/23/2012  Findings: Previous coronary bypass changes noted. Normal heart  size and vascularity. Negative for pneumonia, collapse,  consolidation, edema, effusion or pneumothorax. Trachea midline.  IMPRESSION:  Stable exam. No acute chest process.  Original Report Authenticated By: Judie Petit. Ruel Favors, M.D.   EKG:  Nonspecific ST abnormality, NSR  ASSESSMENT:  1.  NSTEMI with ongoing CP despite IV NTG and Heparin gtts - DDIMER is negative 2.  CAD s/p recent CABG 3.  DM 4.  HTN 5.  Dyslipidemia  PLAN:   1.  Admit to stepdown unit 2.  Continue to cycle cardiac enzymes 3.  Continue IV Heparin gtt 4.  Titrated IV NTG gtt to try to get pain free 5.  Continue ASA/beta blocker 6.  If enzymes continue to trend upward and cannot get pain free will need to proceed with cath tonight  Quintella Reichert, MD  03/24/2012  7:05 PM

## 2012-03-24 NOTE — ED Provider Notes (Signed)
History     CSN: [id number]  Arrival date & time 03/24/12  1615   First MD Initiated Contact with Patient 03/24/12 1654      Chief Complaint  Patient presents with  . Chest Pain    (Consider location/radiation/quality/duration/timing/severity/associated sxs/prior treatment) HPI Patient with history of coronary artery disease and prior MI presents with complaint of midsternal chest pain with radiation into her right shoulder and arm which has been present constantly over the past 3 days. She also states pain radiated into her right jaw and symptoms were also associated with nausea. Her symptoms have been ongoing for 3 days and she was out of town at [place] and therefore did not want to seek medical treatment until arriving home from the beach. She states that her symptoms were very similar to the prior MI that she had in December of 2012. She has taken nitroglycerin which did help decrease the pain somewhat. She denies any palpitations or syncope. She's had no fever or cough. She also denies swelling of her legs.  Exertion makes the symptoms worse, there are no other alleviating or modifying factors, there are no associated systemic symptoms.     Past Medical History  Diagnosis Date  . Hypertension   . Hypothyroid   . Fibromyalgia   . Hypercholesteremia   . Angina   . GERD (gastroesophageal reflux disease)   . Headache   . Depression   . H/O hiatal hernia   . Asthma   . PONV (postoperative nausea and vomiting)   . Heart murmur   . Diabetes mellitus     type 2  . Coronary artery disease     s/p CABG  . Myocardial infarction 11/2011    Past Surgical History  Procedure Date  . Cholecystectomy   . Coronary artery bypass graft 11/27/2011    Procedure: CORONARY ARTERY BYPASS GRAFTING (CABG);  Surgeon: Delight Ovens, MD;  Location: [facility] OR;  Service: Open Heart Surgery;  Laterality: N/A;  coronary artery bypass graft on pump times two utilizing left internal mammary artery  and right saphenous vein harvested endoscopically   . Knee surgery     Family History  Problem Relation Age of Onset  . Heart disease Father 69    died of MI  . Peripheral vascular disease Mother 53    bilaterial amputations    History  Substance Use Topics  . Smoking status: Never Smoker   . Smokeless tobacco: Never Used  . Alcohol Use: No    OB History    Grav Para Term Preterm Abortions TAB SAB Ect Mult Living                  Review of Systems ROS reviewed and all otherwise negative except for mentioned in HPI  Allergies  Byetta and Crestor  Home Medications  No current outpatient prescriptions on file.  BP 116/68  Pulse 82  Temp(Src) 98.2 F (36.8 C) (Oral)  Resp 17  Ht 5\' 3"  (1.6 m)  Wt 161 lb 9.6 oz (73.3 kg)  BMI 28.63 kg/m2  SpO2 100% Vitals reviewed Physical Exam Physical Examination: General appearance - alert, well appearing, and in no distress Mental status - alert, oriented to person, place, and time Eyes - pupils equal and reactive Mouth - mucous membranes moist, pharynx normal without lesions Chest - clear to auscultation, no wheezes, rales or rhonchi, symmetric air entry Heart - normal rate, regular rhythm, normal S1, S2, no murmurs, rubs, clicks or gallops  Abdomen - soft, nontender, nondistended, no masses or organomegaly, nabs Extremities - peripheral pulses normal, no pedal edema, no clubbing or cyanosis Skin - normal coloration and turgor, no rashes Psych-normal mood and affect  ED Course  Procedures (including critical care time)   Date: 03/24/2012  Rate: 73  Rhythm: normal sinus rhythm  QRS Axis: normal  Intervals: normal  ST/T Wave abnormalities: very mild ST depression in leads I, aVL, V2, V3  Conduction Disutrbances:none  Narrative Interpretation:   Old EKG Reviewed: changes noted compared to ekg of 12/05/11  CRITICAL CARE Performed by: Ethelda Chick   Total critical care time: 40  Critical care time was exclusive  of separately billable procedures and treating other patients.  Critical care was necessary to treat or prevent imminent or life-threatening deterioration.  Critical care was time spent personally by me on the following activities: development of treatment plan with patient and/or surrogate as well as nursing, discussions with consultants, evaluation of patient's response to treatment, examination of patient, obtaining history from patient or surrogate, ordering and performing treatments and interventions, ordering and review of laboratory studies, ordering and review of radiographic studies, pulse oximetry and re-evaluation of patient's condition.     Labs Reviewed  CBC - Abnormal; Notable for the following:    Platelets 129 (*)    All other components within normal limits  BASIC METABOLIC PANEL - Abnormal; Notable for the following:    Potassium 5.2 (*)    Glucose, Bld 325 (*)    All other components within normal limits  POCT I-STAT TROPONIN I - Abnormal; Notable for the following:    Troponin i, poc 0.97 (*)    All other components within normal limits  CARDIAC PANEL(CRET KIN+CKTOT+MB+TROPI) - Abnormal; Notable for the following:    Total CK 256 (*)    CK, MB 44.3 (*)    Troponin I 1.52 (*)    Relative Index 17.3 (*)    All other components within normal limits  D-DIMER, QUANTITATIVE  HEPARIN LEVEL (UNFRACTIONATED)  HEPARIN LEVEL (UNFRACTIONATED)  CBC  MRSA PCR SCREENING  CARDIAC PANEL(CRET KIN+CKTOT+MB+TROPI)  CARDIAC PANEL(CRET KIN+CKTOT+MB+TROPI)  CARDIAC PANEL(CRET KIN+CKTOT+MB+TROPI)  PROTIME-INR  APTT  CBC  DIFFERENTIAL  TSH  COMPREHENSIVE METABOLIC PANEL  HEMOGLOBIN A1C  OCCULT BLOOD X 1 CARD TO LAB, STOOL  LIPID PANEL  BASIC METABOLIC PANEL   Dg Chest 2 View  03/24/2012  *RADIOLOGY REPORT*  Clinical Data: Chest pain  CHEST - 2 VIEW  Comparison: 10/23/2012  Findings: Previous coronary bypass changes noted.  Normal heart size and vascularity.  Negative for  pneumonia, collapse, consolidation, edema, effusion or pneumothorax.  Trachea midline.  IMPRESSION: Stable exam.  No acute chest process.  Original Report Authenticated By: Judie Petit. TREVOR Miles Costain, M.D.     1. NSTEMI (non-ST elevated myocardial infarction)   2. Hyperglycemia   3. Hyperkalemia       MDM   Patient with history of prior MI presenting with symptoms similar to her prior MI. Her workup in the emergency department showed some EKG changes as noted above, she also had an elevated troponin. She was started on heparin and nitroglycerin drip and continued to have chest pain although pain was somewhat improved after nitroglycerin. Cardiology was consult and Dr. Mayford Knife saw the patient in the emergency room. Her note specified that if the chest pain continued she may proceed with cardiac catheterization. Prior to patient being admitted to the floor her Dr. Mayford Knife did decide that patient should be catheterized as she  continued to have ongoing pain.       Ethelda Chick, MD 03/28/12 [phone]

## 2012-03-24 NOTE — ED Notes (Signed)
Pt c/o midsternal CP with radiation to right arm x 3 days; pt sts pain radiates into jaw and nausea; pt sts hx of MI with similar sx in December; pt sts was at beach and did not want to go to hospital; pt sts nitro helps decrease pain

## 2012-03-24 NOTE — Progress Notes (Signed)
ANTICOAGULATION CONSULT NOTE - Initial Consult  Pharmacy Consult for Heparin Indication: chest pain/ACS  Allergies  Allergen Reactions  . Byetta Nausea And Vomiting  . Crestor (Rosuvastatin Calcium) Nausea And Vomiting    Patient Measurements:   Per patient: Ht=5'2", Wt=69 kg Heparin Dosing Weight: 64.5kg  Vital Signs: Temp: 98.3 F (36.8 C) (04/16 1619) BP: 145/70 mmHg (04/16 1619) Pulse Rate: 74  (04/16 1619)  Labs:  Basename 03/24/12 1631  HGB 13.8  HCT 40.1  PLT 129*  APTT --  LABPROT --  INR --  HEPARINUNFRC --  CREATININE 0.62  CKTOTAL --  CKMB --  TROPONINI --   The CrCl is unknown because both a height and weight (above a minimum accepted value) are required for this calculation.  Medical History: Past Medical History  Diagnosis Date  . Hypertension   . Hypothyroid   . Fibromyalgia   . Hypercholesteremia   . Angina   . Coronary artery disease   . GERD (gastroesophageal reflux disease)   . Headache   . Depression   . H/O hiatal hernia   . Asthma   . Diabetes mellitus     Assessment: 61 yo F admitted with chest pain, r/o ACS, with +troponin to begin heparin per pharmacy.  Platelets slightly low at 129.  Spoke with patient, no recent bleeding episodes.  Goal of Therapy:  Heparin level 0.3-0.7 units/ml   Plan:  1.  Heparin 3500 unit IV bolus x1 2.  Begin heparin infusion at 750 units/hr 3.  Heparin level 6 hours after beginning 4.  Daily heparin level, CBC  Rolland Porter, Pharm.D., BCPS Clinical Pharmacist Pager: [phone]

## 2012-03-24 NOTE — Progress Notes (Signed)
Chaplain's Note:  Responded to Code STEMI pg.  Staff preparing pt to go to Cath Lab.  Introduced myself and offered pastoral support to pt.  Pt smiled and thanked chaplain for checking but she had no need for my presence at this time.  Will follow-up as needed or requested.

## 2012-03-24 NOTE — ED Notes (Signed)
Abnormal lab test results handed to Linker, MD

## 2012-03-24 NOTE — CV Procedure (Signed)
PROCEDURE:  Left heart catheterization with selective coronary angiography, left ventriculogram. SVG angiogram. Arterial conduit angiogram. PCI left circumflex.  INDICATIONS:  NSTEMI with refractory chest pain  The risks, benefits, and details of the procedure were explained to the patient.  The patient verbalized understanding and wanted to proceed.  Informed written consent was obtained.  PROCEDURE TECHNIQUE:  After Xylocaine anesthesia a 51F sheath was placed in the right femoral artery with a single anterior needle wall stick.   Left coronary angiography was done using a Judkins L4 guide catheter.  Right coronary angiography was done using a Judkins R4 guide catheter.  Left ventriculography was done using a pigtail catheter.    CONTRAST:  Total of 225 cc.  COMPLICATIONS:  None.    HEMODYNAMICS:  Aortic pressure was 110/65; LV pressure was 120/16; LVEDP 22.  There was no gradient between the left ventricle and aorta.    ANGIOGRAPHIC DATA:   There is no left main coronary artery.  There are separate LAD and circumflex ostia.  The left anterior descending artery had moderate disease in the proximal LAD.  The mid LAD was occluded.   There is a large first diagonal that is widely patent.  The left circumflex artery is a large vessel proximally.  The proximal vessel is occluded.  The right coronary artery is a medium sized dominant vessel.  There is spasm at the tip of the catheter.  The RCA is widely patent.  The PLA and PDA are medium sized and widely patent.  LIMA to LAD is patent. SVG to OM has a 95% ostial stenosis.  At the distal insertion, there is a 90 percent stenosis that spans the entire insertion point.  Disease extends into the anterograde and retrograde branches.  LEFT VENTRICULOGRAM:  Left ventricular angiogram was done in the 30 RAO projection and revealed inferior hypokinesis and low normal systolic function with an estimated ejection fraction of 50%.  LVEDP was 22  mmHg.   PCI NARRATIVE: CLS 3.5 Guide catheter.  Prowter wire across the area of disease.  Angiomax was used for coagulation.  A CT disease confirmed at the Angiomax is therapeutic.  A 2.0 x 15 emerge balloon was placed across the area of disease in the proximal circumflex.  This was inflated twice to 8 atmospheres.  It was noted that there was significant disease in the OM1.  The 2.0 x 15 emergently was used to predilate this area.  Multiple doses of intracoronary nitroglycerin were used to help increase the size of this vessel.  When looking at the prior antegrade and from December, the circumflex is an extremely large vessel.  There was some vasospasm which was improved with nitroglycerin.  The distal area of disease in the OM1 was stented with a 2.5 x 24 Promus element stent.   The proximal area was stented with a 2.75 x 32 Promus element stent.  The distal area was postdilated to low pressure with a 3.0 x 20 [place] Quantum Apex balloon.  The same balloon was used to postdilated the proximal stent to 18 atmospheres.  There is no residual stenosis.  There is some spasm at the proximal edge of the distal stent.  This resolved with intracoronary nitroglycerin.  TIMI 3 flow was restored from initial TIMI 0 flow.  The distal lesion in the OM1 had a lesion length of 16 mm.  The more proximal area of disease in the circumflex had a length of 28 mm.  IMPRESSIONS:  1. Patent RCA, LIMA  to distal LAD, large first diagonal from the native proximal LAD.  The mid LAD is occluded. 2. Ostial 95% stenosis in the SVG to circumflex.  90% stenosis in the insertion of the SVG to OM which extended into the anterograde and retrograde branches. 3. DES to proximal circumflex with a 2.75 x 32 Promus stent, postdilated to > 3.0 mm in diameter.  DES to OM1 with a 2.5 x 24 Promus.  TIMI 3 flow restored. 4. Normal right coronary artery.  There was some catheter-induced spasm noted in the RCA. 5. Low normal left ventricular systolic  function.  LVEDP 22 mmHg.  Ejection fraction 50%.  RECOMMENDATION:  Continue dual antiplatelet therapy for at least a year.  I discussed with the patient that her vessels were likely smaller in diameter than usual due to her acute MI.  Consideration would have to be made for repeat cath with possible IVUS to make sure that her stents are well apposed.  This could be done in the next few months.  She'll be watched in the step down unit tonight.  She'll be in the hospital for at least a few days.

## 2012-03-24 NOTE — ED Notes (Signed)
TC from DR Mayford Knife -  The plan of care is to send PT to Cath. Lab .

## 2012-03-25 ENCOUNTER — Ambulatory Visit (HOSPITAL_COMMUNITY): Payer: BC Managed Care – PPO

## 2012-03-25 ENCOUNTER — Encounter (HOSPITAL_COMMUNITY): Payer: BC Managed Care – PPO

## 2012-03-25 LAB — CBC
HCT: 35.8 % — ABNORMAL LOW (ref 36.0–46.0)
Hemoglobin: 12.4 g/dL (ref 12.0–15.0)
Hemoglobin: 12.9 g/dL (ref 12.0–15.0)
MCH: 29.8 pg (ref 26.0–34.0)
MCHC: 33.7 g/dL (ref 30.0–36.0)
MCV: 88 fL (ref 78.0–100.0)
Platelets: 115 10*3/uL — ABNORMAL LOW (ref 150–400)
RBC: 4.07 MIL/uL (ref 3.87–5.11)
RBC: 4.33 MIL/uL (ref 3.87–5.11)
RDW: 13.4 % (ref 11.5–15.5)
WBC: 7.8 10*3/uL (ref 4.0–10.5)

## 2012-03-25 LAB — BASIC METABOLIC PANEL
CO2: 26 mEq/L (ref 19–32)
Calcium: 9 mg/dL (ref 8.4–10.5)
GFR calc non Af Amer: >90 mL/min (ref >90)
Glucose, Bld: 159 mg/dL — ABNORMAL HIGH (ref 70–99)
Potassium: 3.8 mEq/L (ref 3.5–5.1)
Sodium: 140 mEq/L (ref 135–145)

## 2012-03-25 LAB — GLUCOSE, CAPILLARY
Glucose-Capillary: 154 mg/dL — ABNORMAL HIGH (ref 70–99)
Glucose-Capillary: 222 mg/dL — ABNORMAL HIGH (ref 70–99)

## 2012-03-25 LAB — COMPREHENSIVE METABOLIC PANEL
Alkaline Phosphatase: 144 U/L — ABNORMAL HIGH (ref 39–117)
BUN: 13 mg/dL (ref 6–23)
CO2: 28 mEq/L (ref 19–32)
Chloride: 102 mEq/L (ref 96–112)
Creatinine, Ser: 0.65 mg/dL (ref 0.50–1.10)
GFR calc non Af Amer: >90 mL/min (ref >90)
Glucose, Bld: 142 mg/dL — ABNORMAL HIGH (ref 70–99)
Potassium: 3.9 mEq/L (ref 3.5–5.1)
Total Bilirubin: 0.7 mg/dL (ref 0.3–1.2)

## 2012-03-25 LAB — DIFFERENTIAL
Eosinophils Relative: 1 % (ref 0–5)
Lymphocytes Relative: 29 % (ref 12–46)
Lymphs Abs: 2.2 10*3/uL (ref 0.7–4.0)
Monocytes Absolute: 0.8 10*3/uL (ref 0.1–1.0)
Monocytes Relative: 10 % (ref 3–12)

## 2012-03-25 LAB — LIPID PANEL: LDL Cholesterol: 101 mg/dL — ABNORMAL HIGH (ref 0–99)

## 2012-03-25 LAB — CARDIAC PANEL(CRET KIN+CKTOT+MB+TROPI)
Relative Index: 16 — ABNORMAL HIGH (ref 0.0–2.5)
Relative Index: 15.9 — ABNORMAL HIGH (ref 0.0–2.5)
Total CK: 343 U/L — ABNORMAL HIGH (ref 7–177)
Total CK: 361 U/L — ABNORMAL HIGH (ref 7–177)
Total CK: 261 U/L — ABNORMAL HIGH (ref 7–177)
Troponin I: 10.78 ng/mL (ref <0.30)

## 2012-03-25 LAB — HEMOGLOBIN A1C
Hgb A1c MFr Bld: 6 % — ABNORMAL HIGH (ref <5.7)
Mean Plasma Glucose: 126 mg/dL — ABNORMAL HIGH (ref <117)

## 2012-03-25 LAB — MRSA PCR SCREENING: MRSA by PCR: NEGATIVE

## 2012-03-25 LAB — HEPARIN LEVEL (UNFRACTIONATED): Heparin Unfractionated: <0.1 IU/mL — ABNORMAL LOW (ref 0.30–0.70)

## 2012-03-25 MED ORDER — ASPIRIN 81 MG PO CHEW
324.0000 mg | CHEWABLE_TABLET | ORAL | Status: DC
  Filled 2012-03-25: qty 1

## 2012-03-25 MED ORDER — SODIUM CHLORIDE 0.9 % IJ SOLN
3.0000 mL | Freq: Two times a day (BID) | INTRAMUSCULAR | Status: DC
  Administered 2012-03-25 (×2): 3 mL via INTRAVENOUS

## 2012-03-25 MED ORDER — SODIUM CHLORIDE 0.9 % IV SOLN: 250.0000 mL | INTRAVENOUS | Status: DC | PRN

## 2012-03-25 MED ORDER — DULOXETINE HCL 60 MG PO CPEP
90.0000 mg | ORAL_CAPSULE | Freq: Every day | ORAL | Status: DC
  Administered 2012-03-25 – 2012-03-26 (×2): 90 mg via ORAL
  Filled 2012-03-25 (×2): qty 1

## 2012-03-25 MED ORDER — INSULIN ASPART 100 UNIT/ML ~~LOC~~ SOLN
0.0000 [IU] | Freq: Three times a day (TID) | SUBCUTANEOUS | Status: DC
  Administered 2012-03-25: 3 [IU] via SUBCUTANEOUS
  Administered 2012-03-25: 5 [IU] via SUBCUTANEOUS
  Administered 2012-03-25 – 2012-03-26 (×2): 2 [IU] via SUBCUTANEOUS

## 2012-03-25 MED FILL — Dextrose Inj 5%: INTRAVENOUS | Qty: 50 | Status: AC

## 2012-03-25 NOTE — Progress Notes (Signed)
CARDIAC REHAB PHASE I   PRE:  Rate/Rhythm: 78 SR    BP: sitting 98/50    SaO2:   MODE:  Ambulation: 350 ft   POST:  Rate/Rhythm: 87 SR    BP: sitting 99/49     SaO2:   Tolerated well, no c/o. Ed completed. Will update CRPII. Pt has 5 sessions left. [id number]  Harriet Masson CES, ACSM

## 2012-03-25 NOTE — Progress Notes (Signed)
UR Completed. Simmons, Samiha Denapoli F [phone]  

## 2012-03-25 NOTE — Progress Notes (Signed)
SUBJECTIVE:  No chest pain.  The symptom that brought her to the hospital has resolved completely.  She does have a symptom that she describes as a wave across her chest.  It comes and goes.  She has had this in the past when her blood sugar has been high.  She had not eat anything in about 24 hours.  She has been off her metformin since a cardiac cath last night.  Her blood pressures have been borderline at times.  OBJECTIVE:   Vitals:   Filed Vitals:   03/25/12 0200 03/25/12 0300 03/25/12 0340 03/25/12 0817  BP: 92/55 91/41 90/47  97/57  Pulse: 77 71 73 72  Temp:   98.1 F (36.7 C) 97.6 F (36.4 C)  TempSrc:    Oral  Resp: 15 17 20    Height:      Weight:      SpO2: 100% 100% 100% 100%   I&O's:   Intake/Output Summary (Last 24 hours) at 03/25/12 1002 Last data filed at 03/25/12 0900  Gross per 24 hour  Intake    884 ml  Output    800 ml  Net     84 ml   TELEMETRY: Reviewed telemetry pt in NSR:     PHYSICAL EXAM General: Well developed, well nourished, in no acute distress Head:    Normal cephalic and atramatic  Lungs: Clear bilaterally to auscultation and percussion. Heart:   HRRR S1 S2  Abdomen:  abdomen soft and non-tender Msk:  Back normal, normal gait. Normal strength and tone for age. Extremities:   No  edema.  No hematoma; palpable DP pulse Neuro: Alert and oriented X 3. Psych:  Good affect, responds appropriately   LABS: Basic Metabolic Panel:  Basename 03/25/12 0545 03/25/12 0002  NA 140 136  K 3.8 3.9  CL 104 102  CO2 26 28  GLUCOSE 159* 142*  BUN 11 13  CREATININE 0.69 0.65  CALCIUM 9.0 8.8  MG -- --  PHOS -- --   Liver Function Tests:  [facility] 03/25/12 0002  AST 69*  ALT 43*  ALKPHOS 144*  BILITOT 0.7  PROT 5.8*  ALBUMIN 3.0*   No results found for this basename: LIPASE:2,AMYLASE:2 in the last 72 hours CBC:  Basename 03/25/12 0545 03/25/12 0002  WBC 7.1 7.8  NEUTROABS -- 4.7  HGB 12.9 12.4  HCT 38.3 35.8*  MCV 88.5 88.0  PLT 115*  116*   Cardiac Enzymes:  Basename 03/25/12 0545 03/25/12 0002 03/24/12 1944  CKTOTAL 361* 343* 256*  CKMB 57.9* 53.7* 44.3*  CKMBINDEX -- -- --  TROPONINI 10.78* 12.55* 1.52*   BNP: No components found with this basename: POCBNP:3 D-Dimer:  [facility] 03/24/12 1824  DDIMER <0.22   Hemoglobin A1C: No results found for this basename: HGBA1C in the last 72 hours Fasting Lipid Panel:  Basename 03/25/12 0545  CHOL 176  HDL 54  LDLCALC 101*  TRIG 104  CHOLHDL 3.3  LDLDIRECT --   Thyroid Function Tests: No results found for this basename: TSH,T4TOTAL,FREET3,T3FREE,THYROIDAB in the last 72 hours Anemia Panel: No results found for this basename: VITAMINB12,FOLATE,FERRITIN,TIBC,IRON,RETICCTPCT in the last 72 hours Coag Panel:   Lab Results  Component Value Date   INR 1.42 03/25/2012   INR 1.32 11/27/2011   INR 1.11 11/25/2011    RADIOLOGY: Dg Chest 2 View  03/24/2012  *RADIOLOGY REPORT*  Clinical Data: Chest pain  CHEST - 2 VIEW  Comparison: 10/23/2012  Findings: Previous coronary bypass changes noted.  Normal heart size  and vascularity.  Negative for pneumonia, collapse, consolidation, edema, effusion or pneumothorax.  Trachea midline.  IMPRESSION: Stable exam.  No acute chest process.  Original Report Authenticated By: Judie Petit. Ruel Favors, M.D.      ASSESSMENT: NSTEMI  PLAN:  1) her symptoms have been going on for 3 days intermittently.  No severe symptoms were yesterday morning.  I suspect that her graft may have been completely closed at the time her symptoms were very severe.  She had some recanalization with flow into the lateral wall territory.  Due to the nature of the disease in relation to the graft, it was better to treat the native vessel.  When comparing the size of the native vessel from December 2012 to this cath done last night, the vessel appeared quite smaller on the more recent cath.  I think this is likely due to vasospasm in the vessel being underfilled.  We tried  to expand her stents as much as possible without traumatizing the vessel.  I did explain to her that in a few weeks, after she recovered from her MI, we will consider doing another angiogram with intravascular ultrasound to make sure that the stents are well apposed and were appropriately sized.  2) It is unclear to me what this other sensation is that she is having in her chest.  We'll continue to watch and cycle enzymes.  Her total CK has been only mildly elevated.  Due to the duration of her symptoms, I'm not surprised that we're having a slow resolution to the enzyme  Abnormality.  3) continue lipid-lowering therapy.  Her LFTs are mildly elevated.  We'll have to compare to what results she has had in the office.    4) diabetes not controlled with metformin alone.  She is off of the metformin for 48 hours after the cardiac cath.  Continue to follow blood sugars closely.  Will watch in step down until the symptoms that she reported this morning have resolved.  Corky Crafts., MD  03/25/2012  10:02 AM

## 2012-03-25 NOTE — Progress Notes (Signed)
Responded to code stemi page, assisted bedside RN with prepping patient for Cath Lab. Transported patient to cath lab,

## 2012-03-25 NOTE — Progress Notes (Signed)
Report from Night RN. Chart reviewed together. Handoff complete.  

## 2012-03-26 LAB — CBC
Hemoglobin: 12.7 g/dL (ref 12.0–15.0)
Platelets: 105 10*3/uL — ABNORMAL LOW (ref 150–400)

## 2012-03-26 MED ORDER — ASPIRIN 81 MG PO CHEW: 81.0000 mg | CHEWABLE_TABLET | Freq: Every day | ORAL | Status: AC

## 2012-03-26 MED ORDER — TICAGRELOR 90 MG PO TABS: 90.0000 mg | ORAL_TABLET | Freq: Two times a day (BID) | ORAL | Status: DC

## 2012-03-26 NOTE — Discharge Summary (Signed)
Patient ID: DIMA MINI MRN: [id number] DOB/AGE: 59-Dec-1952 59 y.o.  Admit date: 03/26/2012 Discharge date: 03/26/2012  Primary Discharge Diagnosis: Non-ST elevation myocardial infarction, occluded SVG to OM graft, circumflex stenting, DES  Secondary Discharge Diagnosis: CAD post bypass December 2012, diabetes, hyperlipidemia  Significant Diagnostic Studies: Cardiac catheterization 03/26/2012: 1. Patent RCA, LIMA to distal LAD, large first diagonal from the native proximal LAD. The mid LAD is occluded. 2. Ostial 95% stenosis in the SVG to circumflex. 90% stenosis in the insertion of the SVG to OM which extended into the anterograde and retrograde branches. 3. DES to proximal circumflex with a 2.75 x 32 Promus stent, postdilated to > 3.0 mm in diameter. DES to OM1 with a 2.5 x 24 Promus. TIMI 3 flow restored. 4. Normal right coronary artery. There was some catheter-induced spasm noted in the RCA. 5. Low normal left ventricular systolic function. LVEDP 22 mmHg. Ejection fraction 50%.   Hospital Course: 61 year old female with known coronary artery disease status post bypass in December of 2012 by Dr. Tyrone Sage in the setting of non-ST elevation myocardial infarction at that time after being released from the outpatient setting following diagnostic cardiac catheterization who presented to the hospital with chest discomfort similar to prior anginal event with radiation down her right arm, feeling poorly. She had just gone to the beach, [place] at [facility], and she began feeling poorly there. Her discomfort was waxing and waning in severity. Radiated to the jaw as well. Her prior MI involved left arm pain however.  Her troponin was positive on admission and she was taken to the cardiac catheterization lab at night because of unrelenting chest discomfort. Dr. Eldridge Dace performed cardiac catheterization. Findings are as follows: 6. Patent RCA, LIMA to distal LAD, large first diagonal from the  native proximal LAD. The mid LAD is occluded. 7. Ostial 95% stenosis in the SVG to circumflex. 90% stenosis in the insertion of the SVG to OM which extended into the anterograde and retrograde branches. 8. DES to proximal circumflex with a 2.75 x 32 Promus stent, postdilated to > 3.0 mm in diameter. DES to OM1 with a 2.5 x 24 Promus. TIMI 3 flow restored. 9. Normal right coronary artery. There was some catheter-induced spasm noted in the RCA. 10. Low normal left ventricular systolic function. LVEDP 22 mmHg. Ejection fraction 50%.  Plan was to take her back in to the cardiac catheterization lab in approximally 2 months for angiogram/IVUS to determine if stent wall apposition is appropriate. There was a degree of vasospasm during cardiac catheterization.  Her hospitalization was uneventful. She remained chest pain-free and was angulating well on morning of discharge. Her blood pressures were running in the upper 90s/low 100s but she was asymptomatic. No evidence of bleeding. Angio-Seal was performed. She was alert and oriented x3 in no acute distress, her heart sounds were regular rate and rhythm with 2/6 systolic murmur left lower sternal border, lungs were clear, pulses were 2+ distal extremities, small hematoma was noted at catheterization site, no bruit.  I had just seen her in clinic last Friday and started her on Livalo 2 mg. She is a history of fatty liver disease and mildly elevated AST/ALT. Her LDL during this admission was 101.  Cardiac rehabilitation saw her during admission. She will be resuming after next week.    Discharge Exam: Blood pressure 105/49, pulse 69, temperature 97.9 F (36.6 C), temperature source Oral, resp. rate 16, height 5\' 3"  (1.6 m), weight 73.3 kg (161 lb 9.6  oz), SpO2 98.00%.    Labs:   Lab Results  Component Value Date   WBC 6.4 03/26/2012   HGB 12.7 03/26/2012   HCT 36.5 03/26/2012   MCV 88.4 03/26/2012   PLT 105* 03/26/2012    Lab 03/25/12 0545 03/25/12  0002  NA 140 --  K 3.8 --  CL 104 --  CO2 26 --  BUN 11 --  CREATININE 0.69 --  CALCIUM 9.0 --  PROT -- 5.8*  BILITOT -- 0.7  ALKPHOS -- 144*  ALT -- 43*  AST -- 69*  GLUCOSE 159* --   Lab Results  Component Value Date   CKTOTAL 261* 03/25/2012   CKMB 41.6* 03/25/2012   TROPONINI 5.98* 03/25/2012    Lab Results  Component Value Date   CHOL 176 03/25/2012   CHOL 179 11/27/2011   Lab Results  Component Value Date   HDL 54 03/25/2012   HDL 60 16/09/9603   Lab Results  Component Value Date   LDLCALC 101* 03/25/2012   LDLCALC 100* 11/27/2011   Lab Results  Component Value Date   TRIG 104 03/25/2012   TRIG 93 11/27/2011   Lab Results  Component Value Date   CHOLHDL 3.3 03/25/2012   CHOLHDL 3.0 11/27/2011   No results found for this basename: LDLDIRECT        FOLLOW UP PLANS AND APPOINTMENTS Discharge Orders    Future Appointments: Provider: Department: Dept Phone: Center:   03/30/2012 1:15 PM [facility] [phone] None   04/01/2012 1:15 PM [facility] [phone] None   04/03/2012 1:15 PM [facility] [phone] None     Future Orders Please Complete By Expires   Diet - low sodium heart healthy      Increase activity slowly        Medication List  As of 03/26/2012  8:57 AM   STOP taking these medications         aspirin EC 325 MG tablet         TAKE these medications         aspirin 81 MG chewable tablet   Chew 1 tablet (81 mg total) by mouth daily.      budesonide-formoterol 160-4.5 MCG/ACT inhaler   Commonly known as: SYMBICORT   Inhale 2 puffs into the lungs 2 (two) times daily as needed. For shortness of breath      DULoxetine 30 MG capsule   Commonly known as: CYMBALTA   Take 30 mg by mouth daily.      DULoxetine 60 MG capsule   Commonly known as: CYMBALTA   Take 60 mg by mouth daily.      levothyroxine 125 MCG tablet   Commonly known as: SYNTHROID, LEVOTHROID    Take 125 mcg by mouth daily before breakfast.      LIVALO PO   Take 1 tablet by mouth daily.      metFORMIN 500 MG (MOD) 24 hr tablet   Commonly known as: GLUMETZA   Take 500 mg by mouth daily with breakfast.      metoprolol tartrate 25 MG tablet   Commonly known as: LOPRESSOR   Take 25 mg by mouth 2 (two) times daily.      nitroGLYCERIN 0.4 MG SL tablet   Commonly known as: NITROSTAT   Place 0.4 mg under the tongue every 5 (five) minutes as needed. For chest pain      omeprazole 40 MG capsule  Commonly known as: PRILOSEC   Take 40 mg by mouth daily.      Ticagrelor 90 MG Tabs tablet   Commonly known as: BRILINTA   Take 1 tablet (90 mg total) by mouth 2 (two) times daily.           Follow-up Information    Follow up with FERGUSON,CYNTHIA A, NP on 04/02/2012. (1030am)    Contact information:   [facility] [address] [place] [address] [phone]         We discussed the importance of dual antiplatelet therapy for a year with drug-eluting stent.  She knows to contact me if any worrisome symptoms develop or seek medical attention.  BRING ALL MEDICATIONS WITH YOU TO FOLLOW UP APPOINTMENTS  Time spent with patient to include physician time: 35 minutes with med reconciliation, education, review of medical records, review of cardiac catheterization, discussion with nursing.  SignedDonato Schultz 03/26/2012, 8:57 AM

## 2012-03-26 NOTE — Progress Notes (Signed)
AVS reviewed with pt. Discharged; transported by spouse

## 2012-03-26 NOTE — Progress Notes (Signed)
Pt has been walking independently without sx. For D/C.  Denise Cuevas CES, ACSM

## 2012-03-27 ENCOUNTER — Encounter (HOSPITAL_COMMUNITY): Payer: BC Managed Care – PPO

## 2012-03-27 ENCOUNTER — Ambulatory Visit (HOSPITAL_COMMUNITY): Payer: BC Managed Care – PPO

## 2012-03-30 ENCOUNTER — Ambulatory Visit (HOSPITAL_COMMUNITY): Payer: BC Managed Care – PPO

## 2012-03-30 ENCOUNTER — Encounter (HOSPITAL_COMMUNITY): Payer: BC Managed Care – PPO

## 2012-04-01 ENCOUNTER — Ambulatory Visit (HOSPITAL_COMMUNITY): Payer: BC Managed Care – PPO

## 2012-04-01 ENCOUNTER — Encounter (HOSPITAL_COMMUNITY): Payer: BC Managed Care – PPO

## 2012-04-03 ENCOUNTER — Encounter (HOSPITAL_COMMUNITY): Payer: BC Managed Care – PPO

## 2012-04-03 ENCOUNTER — Ambulatory Visit (HOSPITAL_COMMUNITY): Payer: BC Managed Care – PPO

## 2012-04-07 NOTE — Progress Notes (Signed)
Pt discharged from cardiac rehab.  Pt had active participation in education and exercise classes. Pt attended 8 education classes and 31 exercise sessions prior to her event.   Pt VSS, glucose difficult to control-frequent hypoglycemia at the beginning of her rehab time.  This was controlled after evaluation by her PCP with medication and diet changes.   Telemetry-NSR.  Pt making positive lifestyle changes and plans to re-enroll in cardiac rehab.

## 2012-04-09 ENCOUNTER — Encounter (HOSPITAL_COMMUNITY)
Admission: RE | Admit: 2012-04-09 | Discharge: 2012-04-09 | Disposition: A | Discharge status: Home / Self Care | Payer: BC Managed Care – PPO | Source: Ambulatory Visit | Attending: Cardiology | Admitting: Cardiology

## 2012-04-09 ENCOUNTER — Encounter (HOSPITAL_COMMUNITY): Payer: Self-pay

## 2012-04-09 DIAGNOSIS — Z951 Presence of aortocoronary bypass graft: Secondary | ICD-10-CM | POA: Insufficient documentation

## 2012-04-09 DIAGNOSIS — E7800 Pure hypercholesterolemia, unspecified: Secondary | ICD-10-CM | POA: Insufficient documentation

## 2012-04-09 DIAGNOSIS — I2510 Atherosclerotic heart disease of native coronary artery without angina pectoris: Secondary | ICD-10-CM | POA: Insufficient documentation

## 2012-04-09 DIAGNOSIS — Z7982 Long term (current) use of aspirin: Secondary | ICD-10-CM | POA: Insufficient documentation

## 2012-04-09 DIAGNOSIS — Z5189 Encounter for other specified aftercare: Secondary | ICD-10-CM | POA: Insufficient documentation

## 2012-04-09 DIAGNOSIS — Z794 Long term (current) use of insulin: Secondary | ICD-10-CM | POA: Insufficient documentation

## 2012-04-09 DIAGNOSIS — E669 Obesity, unspecified: Secondary | ICD-10-CM | POA: Insufficient documentation

## 2012-04-09 DIAGNOSIS — E039 Hypothyroidism, unspecified: Secondary | ICD-10-CM | POA: Insufficient documentation

## 2012-04-09 DIAGNOSIS — I10 Essential (primary) hypertension: Secondary | ICD-10-CM | POA: Insufficient documentation

## 2012-04-09 DIAGNOSIS — I252 Old myocardial infarction: Secondary | ICD-10-CM | POA: Insufficient documentation

## 2012-04-09 DIAGNOSIS — I200 Unstable angina: Secondary | ICD-10-CM | POA: Insufficient documentation

## 2012-04-09 DIAGNOSIS — E119 Type 2 diabetes mellitus without complications: Secondary | ICD-10-CM | POA: Insufficient documentation

## 2012-04-09 NOTE — Progress Notes (Signed)
Cardiac Rehab Medication Review by a Pharmacist  Does the patient  feel that his/her medications are working for him/her?  yes  Has the patient been experiencing any side effects to the medications prescribed?  no  Does the patient measure his/her own blood pressure or blood glucose at home?  yes - checks blood sugar at home  Does the patient have any problems obtaining medications due to transportation or finances?   no  Understanding of regimen: good Understanding of indications: good Potential of compliance: good - uses a pill box for the week and with 4 times per day.        Concha Norway 04/09/2012 9:41 AM

## 2012-04-13 ENCOUNTER — Encounter (HOSPITAL_COMMUNITY)
Admission: RE | Admit: 2012-04-13 | Discharge: 2012-04-13 | Disposition: A | Discharge status: Home / Self Care | Payer: BC Managed Care – PPO | Source: Ambulatory Visit | Attending: Cardiology | Admitting: Cardiology

## 2012-04-13 ENCOUNTER — Encounter (HOSPITAL_COMMUNITY): Payer: Self-pay

## 2012-04-14 NOTE — Progress Notes (Signed)
Pt started cardiac rehab today.  Pt tolerated light exercise without difficulty.  Pt aymptomatic with exercise. VSS,  Telemetry-Sinus rhythm.  Pt oriented to exercise equipment and routine.  Understanding verbalized.  Will continue to monitor the patient throughout  the program.

## 2012-04-15 ENCOUNTER — Encounter (HOSPITAL_COMMUNITY)
Admission: RE | Admit: 2012-04-15 | Discharge: 2012-04-15 | Disposition: A | Discharge status: Home / Self Care | Payer: BC Managed Care – PPO | Source: Ambulatory Visit | Attending: Cardiology | Admitting: Cardiology

## 2012-04-15 LAB — GLUCOSE, CAPILLARY: Glucose-Capillary: 350 mg/dL — ABNORMAL HIGH (ref 70–99)

## 2012-04-15 NOTE — Progress Notes (Signed)
Pt CBG 350 upon arrival to cardiac rehab today.  Pt urine negative for ketones.  Pt denies s/s of infection or illness.  Pt only c/o fatigue.  Pt advised to contact PCP if sx of fatigue worsen or persist and/or elevated CBG continues. Pt reports home CBG 130 fasting this am.  Pt  Will test her glucometer for accuracy with test solution.  Offered referral to Behavioral health for counseling for situational stress and fears r/t recent cardiac event.  Pt declined at this time however does wish to discuss offer again in a few weeks.  Will continue to monitor the patient throughout  the program.

## 2012-04-15 NOTE — Progress Notes (Signed)
Spoke with Denise Furlong, RN, and pt. Pt pre-exercise CBG 350 mg/dL. Pt states, "I'm used to my blood sugar being too low." Pt well-known to this writer from previous admission. Pt typically  follows a strict diet regimen.   24 hour recall Dinner 1 Fried chicken leg (Pt was not feeling well and had not had fried chicken since December 2012) Breakfast Special K blueberries Milk Coffee  Lunch [place] sandwich on whole wheat bread 1.5 cups watermelon 2 bites banana pudding  Fasting CBG this am was 130 mg/dL. Per discussion, pt's glucometer is 59 years old and she has not used a control solution to test it's accuracy. Pt states, "it's always pretty close to what you get here." Pt given control solution to try at home and instructed on how to use it. Pt expressed understanding. Pt questioned re: why she thinks glucose is elevated. Pt denies s/s of infection. Pt reports she has been under stress re: her son's friend's girlfriend. 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 Pt reports her daughter handled this hospitalization "much better since I wasn't gone as long." Pt denies taking on too much/many activities. Per discussion, pt took a nap yesterday for "over 3 hours while the cleaning lady cleaned around me." Pt feels her fibromyalgia is flaring up because she has had a difficult time sleeping due to leg/foot pain. Information above relayed to Denise Cuevas, Charity fundraiser. Denise Cuevas to ask pt if she is interested in counseling to help her cope with stress. Continue client-centered nutrition education by RD as part of interdisciplinary care.  Monitor and evaluate progress toward nutrition goal with team.

## 2012-04-16 NOTE — Progress Notes (Signed)
Denise Cuevas 61 y.o. female       Nutrition Screen                                                                    YES  NO Do you live in a nursing home?  X   Do you eat out more than 3 times/week?   X  If yes, how many times per week do you eat out?  Do you have food allergies?   X If yes, what are you allergic to?  Have you gained or lost more than 10 lbs without trying?               X If yes, how much weight have you lost and over what time period?  lbs gained or lost over  weeks/month  Do you want to lose weight?    X  If yes, what is a goal weight or amount of weight you would like to lose? 10 lb  Do you eat alone most of the time?   X   Do you eat less than 2 meals/day?  X If yes, how many meals do you eat?  Do you drink more than 3 alcohol drinks/day?  X If yes, how many drinks per day?  Are you having trouble with constipation? *  X If yes, what are you doing to help relieve constipation?  Do you have financial difficulties with buying food?*    X   Are you experiencing regular nausea/ vomiting?*     X   Do you have a poor appetite? *                                        X   Do you have trouble chewing/swallowing? *   X    Pt with diagnoses of:  X CABG              X Stent/ PTCA X GERD          X Dyslipidemia  / HDL< 40 / LDL>70 / High TG      X %  Body fat >goal / Body Mass Index >25 X HTN / BP >120/80 X DM/A1c >6 / CBG >126       Pt Risk Score   2       Diagnosis Risk Score  80       Total Risk Score   82                        X High Risk                Low Risk    HT: 62.75" Ht Readings from Last 1 Encounters:  04/09/12 5' 2.75" (1.594 m)    WT:   152.2 lb (69.2 kg) Wt Readings from Last 3 Encounters:  04/09/12 152 lb 8.9 oz (69.2 kg)  03/24/12 161 lb 9.6 oz (73.3 kg)  03/24/12 161 lb 9.6 oz (73.3 kg)     IBW 51.7 134%IBW BMI 27.2 36.0%body fat  Meds reviewed: Metformin  Past Medical History  Diagnosis Date  .  Hypertension   . Hypothyroid   .  Fibromyalgia   . Hypercholesteremia   . Angina   . GERD (gastroesophageal reflux disease)   . Headache   . Depression   . H/O hiatal hernia   . Asthma   . PONV (postoperative nausea and vomiting)   . Heart murmur   . Diabetes mellitus     type 2  . Coronary artery disease     s/p CABG  . Myocardial infarction 11/2011       Activity level: Pt is moderately active  Wt goal: 142 lb ( 64.5 kg) Current tobacco use? No Food/Drug Interaction? No Labs:  Lipid Panel     Component Value Date/Time   CHOL 176 03/25/2012 0545   TRIG 104 03/25/2012 0545   HDL 54 03/25/2012 0545   CHOLHDL 3.3 03/25/2012 0545   VLDL 21 03/25/2012 0545   LDLCALC 101* 03/25/2012 0545   Lab Results  Component Value Date   HGBA1C 6.0* 03/25/2012   03/25/12 Glucose 159  LDL goal: < 70       MI, DM and > 2:      HTN, family h/o, >59 yo female Estimated Daily Nutrition Needs for: ? wt loss  1250-1500 Kcal , Total Fat 35-40gm, Saturated Fat 9-11 gm, Trans Fat 1.2-1.5 gm,  Sodium less than 1500 mg , Grams of CHO 150

## 2012-04-17 ENCOUNTER — Encounter (HOSPITAL_COMMUNITY)
Admission: RE | Admit: 2012-04-17 | Discharge: 2012-04-17 | Disposition: A | Discharge status: Home / Self Care | Payer: BC Managed Care – PPO | Source: Ambulatory Visit | Attending: Cardiology | Admitting: Cardiology

## 2012-04-20 ENCOUNTER — Encounter (HOSPITAL_COMMUNITY)
Admission: RE | Admit: 2012-04-20 | Discharge: 2012-04-20 | Disposition: A | Discharge status: Home / Self Care | Payer: BC Managed Care – PPO | Source: Ambulatory Visit | Attending: Cardiology | Admitting: Cardiology

## 2012-04-20 NOTE — Progress Notes (Signed)
Denise Cuevas 61 y.o. female Nutrition Note Spoke with pt. Pt well-known to this Clinical research associate. Nutrition Plan and Nutrition Survey reviewed with pt. Pt is following Step 2 of the Therapeutic Lifestyle Changes diet. Pt is diabetic. Last A1c indicates CBG's well-controlled. Pt checks CBG's q am before breakfast. Pt pre-exercise CBG elevated last week. Per discussion with pt, elevated CBG's reportedly resolved. This Clinical research associate went over Diabetes Education test results. Weight loss tips discussed briefly. Pt has resumed keeping a food journal, which pt feels has already helped after only 3 days.  Nutrition Diagnosis   Food-and nutrition-related knowledge deficit related to lack of exposure to information as related to diagnosis of: ? CVD ? DM (A1c 6.0)    Overweight related to excessive energy intake as evidenced by a BMI of 27.2  Nutrition RX/ Estimated Daily Nutrition Needs for: wt loss  1250-1500 Kcal, 35-40 gm fat, 9-11 gm sat fat, 1.2-1.5 gm trans-fat, <1500 mg sodium , 150 gm CHO   Nutrition Intervention   Pt's individual nutrition plan including cholesterol goals reviewed with pt.   Benefits of adopting Therapeutic Lifestyle Changes discussed when Medficts reviewed.   Pt to attend the Portion Distortion class   Pt to attend the  ? Nutrition I class - met 01/14/12                        ? Nutrition II class - met 01/07/12        ? Diabetes Blitz class        ? Diabetes Q & A class   Pt given handouts for: ? wt loss ? DM    Continue client-centered nutrition education by RD, as part of interdisciplinary care. Goal(s)   Pt to identify food quantities necessary to achieve: ? wt loss to a goal wt of 142 lb (64.5 kg) at graduation from cardiac rehab.  Monitor and Evaluate progress toward nutrition goal with team.   Nutrition Risk: Moderate

## 2012-04-22 ENCOUNTER — Encounter (HOSPITAL_COMMUNITY)
Admission: RE | Admit: 2012-04-22 | Discharge: 2012-04-22 | Disposition: A | Discharge status: Home / Self Care | Payer: BC Managed Care – PPO | Source: Ambulatory Visit | Attending: Cardiology | Admitting: Cardiology

## 2012-04-24 ENCOUNTER — Encounter (HOSPITAL_COMMUNITY)
Admission: RE | Admit: 2012-04-24 | Discharge: 2012-04-24 | Disposition: A | Discharge status: Home / Self Care | Payer: BC Managed Care – PPO | Source: Ambulatory Visit | Attending: Cardiology | Admitting: Cardiology

## 2012-04-27 ENCOUNTER — Encounter (HOSPITAL_COMMUNITY)
Admission: RE | Admit: 2012-04-27 | Discharge: 2012-04-27 | Disposition: A | Discharge status: Home / Self Care | Payer: BC Managed Care – PPO | Source: Ambulatory Visit | Attending: Cardiology | Admitting: Cardiology

## 2012-04-27 NOTE — Progress Notes (Signed)
Pt reports at cardiac rehab she had episode of chest discomfort at home r/t anxiety associated with power outage at her home.  Pt describes pain as atypical for her anginal equivalent.  Was relieved with NTG SLx1 and ASA.  Pt has had no further episodes of chest discomfort.  Pt exercised at cardiac rehab today without discomfort.  Pt has scheduled f/u with Dr. Anne Fu this week.  Will continue to monitor. Pt advised to notify Dr. Anne Fu for continued episodes and call 911 for worsening unrelieved sx.  Proper use of NTG reviewed.  Understanding verbalized

## 2012-04-29 ENCOUNTER — Encounter (HOSPITAL_COMMUNITY)
Admission: RE | Admit: 2012-04-29 | Discharge: 2012-04-29 | Disposition: A | Discharge status: Home / Self Care | Payer: BC Managed Care – PPO | Source: Ambulatory Visit | Attending: Cardiology | Admitting: Cardiology

## 2012-04-30 ENCOUNTER — Other Ambulatory Visit: Payer: Self-pay | Admitting: Interventional Cardiology

## 2012-05-01 ENCOUNTER — Encounter (HOSPITAL_COMMUNITY): Payer: BC Managed Care – PPO

## 2012-05-04 ENCOUNTER — Encounter (HOSPITAL_COMMUNITY): Payer: BC Managed Care – PPO

## 2012-05-06 ENCOUNTER — Ambulatory Visit (HOSPITAL_COMMUNITY)
Admission: RE | Admit: 2012-05-06 | Discharge: 2012-05-07 | Disposition: A | Discharge status: Home / Self Care | Payer: BC Managed Care – PPO | Source: Ambulatory Visit | Attending: Interventional Cardiology | Admitting: Interventional Cardiology

## 2012-05-06 ENCOUNTER — Encounter (HOSPITAL_COMMUNITY)
Admission: RE | Admit: 2012-05-06 | Discharge: 2012-05-06 | Payer: BC Managed Care – PPO | Source: Ambulatory Visit | Attending: Cardiology | Admitting: Cardiology

## 2012-05-06 ENCOUNTER — Encounter (HOSPITAL_COMMUNITY): Payer: Self-pay | Admitting: General Practice

## 2012-05-06 ENCOUNTER — Encounter (HOSPITAL_COMMUNITY)
Admission: RE | Disposition: A | Discharge status: Home / Self Care | Payer: Self-pay | Source: Ambulatory Visit | Attending: Interventional Cardiology

## 2012-05-06 DIAGNOSIS — I25810 Atherosclerosis of coronary artery bypass graft(s) without angina pectoris: Secondary | ICD-10-CM | POA: Insufficient documentation

## 2012-05-06 DIAGNOSIS — I219 Acute myocardial infarction, unspecified: Secondary | ICD-10-CM | POA: Insufficient documentation

## 2012-05-06 DIAGNOSIS — I2510 Atherosclerotic heart disease of native coronary artery without angina pectoris: Secondary | ICD-10-CM | POA: Insufficient documentation

## 2012-05-06 DIAGNOSIS — E119 Type 2 diabetes mellitus without complications: Secondary | ICD-10-CM | POA: Insufficient documentation

## 2012-05-06 HISTORY — PX: PERCUTANEOUS CORONARY STENT INTERVENTION (PCI-S): SHX5485

## 2012-05-06 HISTORY — PX: LEFT HEART CATHETERIZATION WITH CORONARY ANGIOGRAM: SHX5451

## 2012-05-06 HISTORY — PX: CORONARY ANGIOPLASTY WITH STENT PLACEMENT: SHX49

## 2012-05-06 LAB — POCT ACTIVATED CLOTTING TIME: Activated Clotting Time: 320 seconds

## 2012-05-06 LAB — GLUCOSE, CAPILLARY
Glucose-Capillary: 164 mg/dL — ABNORMAL HIGH (ref 70–99)
Glucose-Capillary: 132 mg/dL — ABNORMAL HIGH (ref 70–99)

## 2012-05-06 SURGERY — PERCUTANEOUS CORONARY STENT INTERVENTION (PCI-S)
Anesthesia: LOCAL | Laterality: Bilateral

## 2012-05-06 MED ORDER — DIAZEPAM 5 MG PO TABS
5.0000 mg | ORAL_TABLET | ORAL | Status: AC
  Administered 2012-05-06: 5 mg via ORAL
  Filled 2012-05-06: qty 1

## 2012-05-06 MED ORDER — DULOXETINE HCL 30 MG PO CPEP
90.0000 mg | ORAL_CAPSULE | Freq: Every day | ORAL | Status: DC
  Administered 2012-05-06 – 2012-05-07 (×2): 90 mg via ORAL
  Filled 2012-05-06 (×2): qty 3

## 2012-05-06 MED ORDER — ONDANSETRON HCL 4 MG/2ML IJ SOLN: 4.0000 mg | Freq: Four times a day (QID) | INTRAMUSCULAR | Status: DC | PRN

## 2012-05-06 MED ORDER — FENTANYL CITRATE 0.05 MG/ML IJ SOLN
INTRAMUSCULAR | Status: AC
  Filled 2012-05-06: qty 2

## 2012-05-06 MED ORDER — LIDOCAINE-EPINEPHRINE 1 %-1:100000 IJ SOLN
INTRAMUSCULAR | Status: AC
  Filled 2012-05-06: qty 1

## 2012-05-06 MED ORDER — PITAVASTATIN CALCIUM 1 MG PO TABS: 1.0000 mg | ORAL_TABLET | Freq: Every day | ORAL | Status: DC

## 2012-05-06 MED ORDER — SODIUM CHLORIDE 0.9 % IV SOLN
1.0000 mL/kg/h | INTRAVENOUS | Status: AC
  Administered 2012-05-06: 1 mL/kg/h via INTRAVENOUS

## 2012-05-06 MED ORDER — LIDOCAINE HCL (PF) 1 % IJ SOLN
INTRAMUSCULAR | Status: AC
  Filled 2012-05-06: qty 30

## 2012-05-06 MED ORDER — METOPROLOL TARTRATE 25 MG PO TABS
25.0000 mg | ORAL_TABLET | Freq: Two times a day (BID) | ORAL | Status: DC
  Administered 2012-05-06 – 2012-05-07 (×3): 25 mg via ORAL
  Filled 2012-05-06 (×4): qty 1

## 2012-05-06 MED ORDER — BIVALIRUDIN 250 MG IV SOLR
INTRAVENOUS | Status: AC
  Filled 2012-05-06: qty 250

## 2012-05-06 MED ORDER — MIDAZOLAM HCL 2 MG/2ML IJ SOLN
INTRAMUSCULAR | Status: AC
  Filled 2012-05-06: qty 2

## 2012-05-06 MED ORDER — ASPIRIN 81 MG PO CHEW
81.0000 mg | CHEWABLE_TABLET | Freq: Every day | ORAL | Status: DC
  Administered 2012-05-07: 81 mg via ORAL
  Filled 2012-05-06: qty 1

## 2012-05-06 MED ORDER — DULOXETINE HCL 60 MG PO CPEP: 60.0000 mg | ORAL_CAPSULE | Freq: Every day | ORAL | Status: DC

## 2012-05-06 MED ORDER — PANTOPRAZOLE SODIUM 40 MG PO TBEC: 40.0000 mg | DELAYED_RELEASE_TABLET | Freq: Every day | ORAL | Status: DC

## 2012-05-06 MED ORDER — HEPARIN (PORCINE) IN NACL 2-0.9 UNIT/ML-% IJ SOLN
INTRAMUSCULAR | Status: AC
  Filled 2012-05-06: qty 2000

## 2012-05-06 MED ORDER — ASPIRIN 81 MG PO CHEW
324.0000 mg | CHEWABLE_TABLET | ORAL | Status: AC
  Administered 2012-05-06: 324 mg via ORAL
  Filled 2012-05-06: qty 2

## 2012-05-06 MED ORDER — SODIUM CHLORIDE 0.9 % IV SOLN: 250.0000 mL | INTRAVENOUS | Status: DC | PRN

## 2012-05-06 MED ORDER — SODIUM CHLORIDE 0.9 % IJ SOLN: 3.0000 mL | INTRAMUSCULAR | Status: DC | PRN

## 2012-05-06 MED ORDER — TICAGRELOR 90 MG PO TABS
90.0000 mg | ORAL_TABLET | Freq: Two times a day (BID) | ORAL | Status: DC
  Administered 2012-05-06 – 2012-05-07 (×2): 90 mg via ORAL
  Filled 2012-05-06 (×3): qty 1

## 2012-05-06 MED ORDER — ACETAMINOPHEN 325 MG PO TABS: 650.0000 mg | ORAL_TABLET | ORAL | Status: DC | PRN

## 2012-05-06 MED ORDER — LEVOTHYROXINE SODIUM 50 MCG PO TABS
50.0000 ug | ORAL_TABLET | Freq: Every day | ORAL | Status: DC
  Administered 2012-05-07: 50 ug via ORAL
  Filled 2012-05-06 (×2): qty 1

## 2012-05-06 MED ORDER — MORPHINE SULFATE 2 MG/ML IJ SOLN: 1.0000 mg | INTRAMUSCULAR | Status: DC | PRN

## 2012-05-06 MED ORDER — NITROGLYCERIN 0.4 MG SL SUBL: 0.4000 mg | SUBLINGUAL_TABLET | SUBLINGUAL | Status: DC | PRN

## 2012-05-06 MED ORDER — SODIUM CHLORIDE 0.9 % IJ SOLN: 3.0000 mL | Freq: Two times a day (BID) | INTRAMUSCULAR | Status: DC

## 2012-05-06 MED ORDER — TICAGRELOR 90 MG PO TABS
ORAL_TABLET | ORAL | Status: AC
  Administered 2012-05-07: 90 mg via ORAL
  Filled 2012-05-06: qty 1

## 2012-05-06 MED ORDER — SODIUM CHLORIDE 0.9 % IV SOLN
INTRAVENOUS | Status: DC
  Administered 2012-05-06: 08:00:00 via INTRAVENOUS

## 2012-05-06 MED ORDER — ATORVASTATIN CALCIUM 10 MG PO TABS
10.0000 mg | ORAL_TABLET | Freq: Every day | ORAL | Status: DC
  Administered 2012-05-06: 10 mg via ORAL
  Filled 2012-05-06 (×2): qty 1

## 2012-05-06 MED ORDER — SODIUM CHLORIDE 0.9 % IV SOLN
0.2500 mg/kg/h | INTRAVENOUS | Status: AC
  Administered 2012-05-06: 0.25 mg/kg/h via INTRAVENOUS
  Filled 2012-05-06: qty 250

## 2012-05-06 NOTE — H&P (Signed)
  Date of Initial H&P: 04/30/12  History reviewed, patient examined, no change in status, stable for surgery.

## 2012-05-06 NOTE — CV Procedure (Signed)
PROCEDURE:  Left heart catheterization with selective coronary angiography, intravascular ultrasound of the left circumflex and obtuse marginal, PCI of the distal LAD.  INDICATIONS:    The risks, benefits, and details of the procedure were explained to the patient.  The patient verbalized understanding and wanted to proceed.  Informed written consent was obtained.  PROCEDURE TECHNIQUE:  After Xylocaine anesthesia a 6 F sheath was placed in the right femoral artery with a single anterior needle wall stick.   Left coronary angiography was done using a CLS 3.0 guide catheter.  Right coronary angiography was done using a Judkins R4 guide catheter.  They graft angiography was performed using a JR 4 catheter.  The JR 4 catheter was used to engage the LIMA graft. Left heart catheterization was done using a pigtail catheter.    CONTRAST:  Total of 240 cc.  COMPLICATIONS:  None.    HEMODYNAMICS:  Aortic pressure was 128/66; LV pressure was 129/3; LVEDP 9.  There was no gradient between the left ventricle and aorta.    ANGIOGRAPHIC DATA:   The left main coronary artery is short, but widely patent..  The left anterior descending artery is occluded after the origin of the large first diagonal.  The first diagonal has a moderate, 30-40% stenosis proximally.    The left circumflex artery is a large vessel.  The proximal stent is widely patent and appears well size.  The distal stent in the large OM is also widely patent.  The AV continuation artery is widely patent.  The right coronary artery is a medium-sized, dominant vessel.  There is some catheter-induced spasm proximally.  The SVG to OM is occluded.  The LIMA to LAD is widely patent.  In the anastomotic site, there is mild plaque in some views.  In the distal LAD, there is a focal 80% stenosis.  PCI NARRATIVE: Angiomax was given for anticoagulation.  A CLS 3.0 guiding catheters used.  A pro-water wire was placed down the circumflex into the large  OM across both stents.  An intravascular ultrasound catheter was advanced to the second more distal stent to the midportion.  A pullback was performed.  We did not advance it further due to the small nature of the very distal obtuse marginal.  The intravascular ultrasound shows that the stents in the circumflex are well apposed.  There are no stent struts which appear free from vessel wall.  And IMA guiding catheters used to engage the left internal mammary artery with some difficulty.  A pro-water wire was placed into the LIMA into the LAD.  This could not successfully navigate always into the distal LAD.  A BMW wire was then advanced across the 80% stenosis in the distal LAD.  This was predilated with a 2.0 x 12 emerge balloon. A 2.25 x 12 Promus stent was then inflated to 11 atmospheres for 18 seconds across the diseased area .  There still appeared to be a waist in the stent.  This postdilated with a 2.5 x 9 [place] sprinter balloon to 16 atmospheres.  There was no residual stenosis.  TIMI-3 flow was maintained throughout.  Lesion length was 8 mm.    Severeal doses of IC NTG were given to treat vasospasm in the circumflex from the IVUS and in the LAD from the interventional equipment.  IMPRESSIONS:  1.  Widely patent right coronary artery.   2.  Successful drug-eluting stent placement to the distal  left anterior descending artery. 3.  Well apposed stents  in the left circumflex artery and obtuse marginal.  LVEDP 9 mm Hg.  Left ventriculogram not performed.   RECOMMENDATION:   She should continue on dual antiplatelet therapy for at least a year.  Continue aggressive secondary prevention including diabetes control.  She will be watched overnight. Marland Kitchen

## 2012-05-06 NOTE — Progress Notes (Signed)
Pt husband arrived at cardiac rehab department reporting pt will be absent today as she is scheduled for cardiac cath today.

## 2012-05-07 LAB — BASIC METABOLIC PANEL
BUN: 14 mg/dL (ref 6–23)
Chloride: 104 mEq/L (ref 96–112)
GFR calc Af Amer: >90 mL/min (ref >90)
GFR calc non Af Amer: >90 mL/min (ref >90)
Glucose, Bld: 159 mg/dL — ABNORMAL HIGH (ref 70–99)
Potassium: 4.7 mEq/L (ref 3.5–5.1)
Sodium: 140 mEq/L (ref 135–145)

## 2012-05-07 LAB — CBC
HCT: 37.2 % (ref 36.0–46.0)
Hemoglobin: 12.8 g/dL (ref 12.0–15.0)
MCHC: 34.4 g/dL (ref 30.0–36.0)
RDW: 13 % (ref 11.5–15.5)
WBC: 5.1 10*3/uL (ref 4.0–10.5)

## 2012-05-07 MED ORDER — METFORMIN HCL 500 MG PO TABS: 500.0000 mg | ORAL_TABLET | Freq: Two times a day (BID) | ORAL | Status: DC

## 2012-05-07 MED FILL — Dextrose Inj 5%: INTRAVENOUS | Qty: 50 | Status: AC

## 2012-05-07 NOTE — Progress Notes (Signed)
CARDIAC REHAB PHASE I   PRE:  Rate/Rhythm: 70 SR    BP: sitting 130/85    SaO2:   MODE:  Ambulation: 640 ft   POST:  Rate/Rhythm: 80 SR    BP: sitting 141/72     SaO2:   Tolerated well, feels good. Reviewed ed, all questions answered.  [id number]  Harriet Masson CES, ACSM

## 2012-05-07 NOTE — Discharge Summary (Signed)
Patient ID: Denise Cuevas MRN: [id number] DOB/AGE: 1951/03/15 61 y.o.  Admit date: 05/06/2012 Discharge date: 05/07/2012  Primary Discharge Diagnosis CAD Secondary Discharge Diagnosis Diabetes  Significant Diagnostic Studies: angiography: Drug eluting stent to the distal LAD  Consults: None  Hospital Course: 61 y/o who had an MI about 6 weeks ago.  She had stents to the circumflex.  She had further angina and fatigue and was referred for cath.  She had a lesion in the distal LAD.  IVUS of the circumflex showed well apposed stents.  She tolerated the procedure well.  No bleeding.  No further angina.  SHe walked with cardiac rehab and had no problems.   Discharge Exam: Blood pressure 130/85, pulse 72, temperature 97.1 F (36.2 C), temperature source Oral, resp. rate 19, height 5\' 2"  (1.575 m), weight 70.6 kg (155 lb 10.3 oz), SpO2 97.00%.  [place]/AT RRR S1S2 CTA bilaterally No hematoma; right DP pulse 1+ No edema  Labs:   Lab Results  Component Value Date   WBC 5.1 05/07/2012   HGB 12.8 05/07/2012   HCT 37.2 05/07/2012   MCV 86.3 05/07/2012   PLT 106* 05/07/2012    Lab 05/07/12 0425  NA 140  K 4.7  CL 104  CO2 28  BUN 14  CREATININE 0.72  CALCIUM 9.3  PROT --  BILITOT --  ALKPHOS --  ALT --  AST --  GLUCOSE 159*   Lab Results  Component Value Date   CKTOTAL 261* 03/25/2012   CKMB 41.6* 03/25/2012   TROPONINI 5.98* 03/25/2012    Lab Results  Component Value Date   CHOL 176 03/25/2012   CHOL 179 11/27/2011   Lab Results  Component Value Date   HDL 54 03/25/2012   HDL 60 40/98/1191   Lab Results  Component Value Date   LDLCALC 101* 03/25/2012   LDLCALC 100* 11/27/2011   Lab Results  Component Value Date   TRIG 104 03/25/2012   TRIG 93 11/27/2011   Lab Results  Component Value Date   CHOLHDL 3.3 03/25/2012   CHOLHDL 3.0 11/27/2011   No results found for this basename: LDLDIRECT       EKG:NSR, poor R wave progression, no ST segment changes  FOLLOW UP  PLANS AND APPOINTMENTS Discharge Orders    Future Appointments: Provider: Department: Dept Phone: Center:   05/08/2012 1:15 PM [facility] [phone] None   05/11/2012 1:15 PM [facility] [phone] None   05/13/2012  1:15 PM [facility] [phone] None   05/15/2012 1:15 PM [facility] [phone] None   05/18/2012 1:15 PM [facility] [phone] None   05/20/2012 1:15 PM [facility] [phone] None   05/22/2012 1:15 PM [facility] [phone] None   05/25/2012 1:15 PM [facility] [phone] None   05/27/2012 1:15 PM [facility] [phone] None   05/29/2012 1:15 PM [facility] [phone] None   06/01/2012 1:15 PM [facility] [phone] None   06/03/2012 1:15 PM [facility] [phone] None   06/05/2012 1:15 PM [facility] [phone] None   06/08/2012 1:15 PM [facility] [phone] None   06/10/2012 1:15 PM [facility] [phone] None   06/12/2012 1:15 PM [facility] [phone] None   06/15/2012 1:15 PM [facility] [phone] None   06/17/2012 1:15 PM [facility] [phone] None   06/19/2012 1:15 PM [facility] [phone] None   06/22/2012 1:15 PM [facility] [phone] None   06/24/2012 1:15 PM [facility] [phone] None   06/26/2012 1:15 PM [facility] [phone] None   06/29/2012 1:15 PM [facility] [phone] None   07/01/2012 1:15 PM [facility] [phone] None    07/03/2012 1:15 PM [facility] [phone] None   07/06/2012 1:15 PM [facility] [phone] None   07/08/2012 1:15 PM [facility] [phone] None   07/10/2012 1:15 PM [facility] [phone] None   07/13/2012 1:15 PM [facility] [phone] None   07/15/2012 1:15 PM [facility] [phone] None   07/17/2012 1:15 PM [facility] [phone] None     Medication List  As of 05/07/2012  9:06 AM   STOP taking these medications         celecoxib 200 MG capsule         TAKE these medications         aspirin 81 MG chewable tablet   Chew 1 tablet (81 mg total) by mouth daily.      DULoxetine 30 MG capsule   Commonly known as: CYMBALTA   Take 30 mg by mouth daily.      DULoxetine 60 MG capsule   Commonly known as: CYMBALTA   Take 60 mg by mouth daily.      levothyroxine 50 MCG tablet   Commonly known as: SYNTHROID, LEVOTHROID   Take 50 mcg by mouth daily before breakfast.      LIVALO PO   Take 2 mg by mouth daily.      metFORMIN 500 MG tablet   Commonly known as: GLUCOPHAGE   Take 1 tablet (500 mg total) by mouth 2 (two) times daily with a meal.   Start taking on: 05/08/2012      metoprolol tartrate 25 MG tablet   Commonly known as: LOPRESSOR   Take 25 mg by mouth 2 (two) times daily.      nitroGLYCERIN 0.4 MG SL tablet   Commonly known as: NITROSTAT   Place 0.4 mg under the tongue every 5 (five) minutes as needed. For chest pain      omeprazole-sodium bicarbonate 40-1100 MG per capsule   Commonly known as: ZEGERID   Take 1 capsule by mouth daily before breakfast.      Ticagrelor 90 MG Tabs tablet   Commonly known as: BRILINTA   Take 1 tablet (90 mg total) by mouth 2 (two) times daily.           Follow-up Information    Follow up with Donato Schultz, MD in 2 weeks. (call to schedule)     Contact information:   [address] [place] [address] [phone]          BRING ALL MEDICATIONS WITH YOU TO FOLLOW UP APPOINTMENTS  Time spent with patient to include physician time:20 minutes Signed: Dalani Mette S. 05/07/2012, 9:06 AM

## 2012-05-07 NOTE — Discharge Instructions (Signed)
Avoid lifting more than 10 lbs for a week. Coronary Angiography with Stent This is a procedure to widen or open a narrow blood vessel of the heart (coronary artery). When a coronary artery becomes partially blocked it decreases blood flow to that area. This may lead to chest pain or a heart attack (myocardial infarction). Arteries may become blocked by cholesterol buildup (plaque) in the lining or wall. A stent is a small piece of metal that looks like a mesh or a spring. Stent placement may be done right after an angiogram that finds a blocked artery or as a treatment for a heart attack. RISKS AND COMPLICATIONS  Damage to the heart.   A blockage may return.   Bleeding at the site.   Blood clot to another part of the body.  PROCEDURE  You may be given a medication to help you relax before and during the procedure through an IV in your hand or arm.   A local anesthetic to make the area numb may be used before inserting the catheter (a long, hollow tube about the size of a piece of cooked spaghetti).   You will be prepared for the procedure by washing and shaving the area where the catheter will be inserted. This is usually done in the groin.   A specially trained doctor will insert the catheter with a guide wire into an artery. This is guided under a special type of X-ray (fluoroscopy) to the opening of the blocked artery.   Special dye is then injected and X-rays are taken.   A tiny wire is guided to the blocked spot and a balloon is inflated to make the artery wider. The stent is expanded and crushes the plaque into the wall of the vessel. The stent holds the area open like a scaffolding and improves the blood flow.   Sometimes the artery may be made wider using a laser or other tools to remove plaque.   When the blood flow is better, the catheter is removed. The lining of the artery will grow over the stent which stays where it was placed.  AFTER THE PROCEDURE  You will stay in bed  for several hours.   The access site will be watched and you will be checked frequently.   Blood tests, X-rays and an EKG may be done.   You may stay in the hospital overnight for observation.  SEEK IMMEDIATE MEDICAL CARE IF:   You develop chest pain, shortness of breath, feel faint, or pass out.   There is bleeding, swelling, or drainage from the catheter insertion site.   You develop pain, discoloration, coldness, or severe bruising in the leg or arm that held the catheter.   You see blood in your urine or stool. This may be bright red blood in urine or stools, or also appear as black, tarry stools.   You have a fever.  Document Released: 06/01/2003 Document Revised: 11/14/2011 Document Reviewed: 01/22/2008 [facility] Patient Information 2012 [place], [place].Groin Site Care Refer to this sheet in the next few weeks. These instructions provide you with information on caring for yourself after your procedure. Your caregiver may also give you more specific instructions. Your treatment has been planned according to current medical practices, but problems sometimes occur. Call your caregiver if you have any problems or questions after your procedure. HOME CARE INSTRUCTIONS  You may shower 24 hours after the procedure. Remove the bandage (dressing) and gently wash the site with plain soap and water. Gently pat  the site dry.   Do not apply powder or lotion to the site.   Do not sit in a bathtub, swimming pool, or whirlpool for 5 to 7 days.   No bending, squatting, or lifting anything over 10 pounds (4.5 kg) as directed by your caregiver.   Inspect the site at least twice daily.   Do not drive home if you are discharged the same day of the procedure. Have someone else drive you.   You may drive 24 hours after the procedure unless otherwise instructed by your caregiver.  What to expect:  Any bruising will usually fade within 1 to 2 weeks.   Blood that collects in the tissue (hematoma)  may be painful to the touch. It should usually decrease in size and tenderness within 1 to 2 weeks.  SEEK IMMEDIATE MEDICAL CARE IF:  You have unusual pain at the groin site or down the affected leg.   You have redness, warmth, swelling, or pain at the groin site.   You have drainage (other than a small amount of blood on the dressing).   You have chills.   You have a fever or persistent symptoms for more than 72 hours.   You have a fever and your symptoms suddenly get worse.   Your leg becomes pale, cool, tingly, or numb.   You have heavy bleeding from the site. Hold pressure on the site.  Document Released: 12/28/2010 Document Revised: 11/14/2011 Document Reviewed: 12/28/2010 [facility] Patient Information 2012 [place], [place].

## 2012-05-08 ENCOUNTER — Encounter (HOSPITAL_COMMUNITY)
Admission: RE | Admit: 2012-05-08 | Discharge: 2012-05-08 | Disposition: A | Discharge status: Home / Self Care | Payer: BC Managed Care – PPO | Source: Ambulatory Visit | Attending: Cardiology | Admitting: Cardiology

## 2012-05-11 ENCOUNTER — Encounter (HOSPITAL_COMMUNITY): Payer: BC Managed Care – PPO

## 2012-05-13 ENCOUNTER — Encounter (HOSPITAL_COMMUNITY): Admission: RE | Admit: 2012-05-13 | Payer: BC Managed Care – PPO | Source: Ambulatory Visit

## 2012-05-15 ENCOUNTER — Encounter (HOSPITAL_COMMUNITY): Payer: BC Managed Care – PPO

## 2012-05-18 ENCOUNTER — Encounter (HOSPITAL_COMMUNITY): Payer: BC Managed Care – PPO

## 2012-05-20 ENCOUNTER — Encounter (HOSPITAL_COMMUNITY): Payer: BC Managed Care – PPO

## 2012-05-22 ENCOUNTER — Encounter (HOSPITAL_COMMUNITY): Payer: BC Managed Care – PPO

## 2012-05-25 ENCOUNTER — Telehealth (HOSPITAL_COMMUNITY): Payer: Self-pay | Admitting: Cardiac Rehabilitation

## 2012-05-25 ENCOUNTER — Encounter (HOSPITAL_COMMUNITY): Payer: BC Managed Care – PPO

## 2012-05-25 NOTE — Telephone Encounter (Signed)
Spoke to pt about returning to cardiac rehab.  Pt states she continues to have chest pain, requiring NTG at times.  Pt has next office visit 06/01/12. Pt wishes to wait until after this appt to return to exercise.  Pt also reports she has a great deal of anxiety and apprehension r/t the continued discomfort and her ability to resume normal activities without symptoms.  Offered emotional support  and offer to make behavorial health referral for counseling.  Pt declined offer for counseling at this time, however states she will notify if willing to make appt.

## 2012-05-27 ENCOUNTER — Encounter (HOSPITAL_COMMUNITY): Payer: BC Managed Care – PPO

## 2012-05-29 ENCOUNTER — Encounter (HOSPITAL_COMMUNITY): Admission: RE | Admit: 2012-05-29 | Payer: BC Managed Care – PPO | Source: Ambulatory Visit

## 2012-06-01 ENCOUNTER — Encounter (HOSPITAL_COMMUNITY): Payer: BC Managed Care – PPO

## 2012-06-03 ENCOUNTER — Encounter (HOSPITAL_COMMUNITY): Payer: BC Managed Care – PPO

## 2012-06-05 ENCOUNTER — Encounter (HOSPITAL_COMMUNITY): Payer: BC Managed Care – PPO

## 2012-06-08 ENCOUNTER — Encounter (HOSPITAL_COMMUNITY): Payer: BC Managed Care – PPO

## 2012-06-10 ENCOUNTER — Encounter (HOSPITAL_COMMUNITY): Payer: BC Managed Care – PPO

## 2012-06-12 ENCOUNTER — Encounter (HOSPITAL_COMMUNITY): Payer: BC Managed Care – PPO

## 2012-06-12 ENCOUNTER — Telehealth (HOSPITAL_COMMUNITY): Payer: Self-pay | Admitting: Cardiac Rehabilitation

## 2012-06-12 NOTE — Telephone Encounter (Signed)
pc to assess pt ability to return to cardiac rehab. Pt states her chest pain has resolved however she now has right leg and knee pain associated with her lower back. Pt PCP has prescribed prednisone and oxycodone.  Pt also has planned beach vacation 06/14/12-06/21/12. Pt will call cardiac rehab after return from beach trip to return to cardiac rehab.   Pt will also talk with her insurance company for new benefit year for out of pocket expense.

## 2012-06-15 ENCOUNTER — Encounter (HOSPITAL_COMMUNITY): Payer: BC Managed Care – PPO

## 2012-06-17 ENCOUNTER — Encounter (HOSPITAL_COMMUNITY): Payer: BC Managed Care – PPO

## 2012-06-19 ENCOUNTER — Encounter (HOSPITAL_COMMUNITY): Payer: BC Managed Care – PPO

## 2012-06-22 ENCOUNTER — Encounter (HOSPITAL_COMMUNITY): Payer: BC Managed Care – PPO

## 2012-06-23 ENCOUNTER — Other Ambulatory Visit: Payer: Self-pay | Admitting: Family Medicine

## 2012-06-23 DIAGNOSIS — Z1231 Encounter for screening mammogram for malignant neoplasm of breast: Secondary | ICD-10-CM

## 2012-06-24 ENCOUNTER — Encounter (HOSPITAL_COMMUNITY): Payer: BC Managed Care – PPO

## 2012-06-26 ENCOUNTER — Encounter (HOSPITAL_COMMUNITY): Payer: BC Managed Care – PPO

## 2012-06-29 ENCOUNTER — Encounter (HOSPITAL_COMMUNITY): Payer: BC Managed Care – PPO

## 2012-07-01 ENCOUNTER — Encounter (HOSPITAL_COMMUNITY): Payer: BC Managed Care – PPO

## 2012-07-03 ENCOUNTER — Encounter (HOSPITAL_COMMUNITY): Payer: BC Managed Care – PPO

## 2012-07-06 ENCOUNTER — Encounter (HOSPITAL_COMMUNITY): Payer: BC Managed Care – PPO

## 2012-07-08 ENCOUNTER — Encounter (HOSPITAL_COMMUNITY): Payer: BC Managed Care – PPO

## 2012-07-10 ENCOUNTER — Encounter (HOSPITAL_COMMUNITY): Payer: BC Managed Care – PPO

## 2012-07-13 ENCOUNTER — Encounter (HOSPITAL_COMMUNITY): Payer: BC Managed Care – PPO

## 2012-07-15 ENCOUNTER — Encounter (HOSPITAL_COMMUNITY): Payer: BC Managed Care – PPO

## 2012-07-17 ENCOUNTER — Encounter (HOSPITAL_COMMUNITY): Payer: BC Managed Care – PPO

## 2012-07-20 ENCOUNTER — Ambulatory Visit (HOSPITAL_COMMUNITY): Payer: BC Managed Care – PPO

## 2012-07-22 ENCOUNTER — Ambulatory Visit (HOSPITAL_COMMUNITY): Payer: BC Managed Care – PPO

## 2012-07-24 ENCOUNTER — Ambulatory Visit (HOSPITAL_COMMUNITY): Payer: BC Managed Care – PPO

## 2012-07-27 ENCOUNTER — Ambulatory Visit (HOSPITAL_COMMUNITY): Payer: BC Managed Care – PPO

## 2012-07-29 ENCOUNTER — Ambulatory Visit (HOSPITAL_COMMUNITY): Payer: BC Managed Care – PPO

## 2012-07-31 ENCOUNTER — Ambulatory Visit (HOSPITAL_COMMUNITY): Payer: BC Managed Care – PPO

## 2012-08-03 ENCOUNTER — Ambulatory Visit (HOSPITAL_COMMUNITY): Payer: BC Managed Care – PPO

## 2012-08-05 ENCOUNTER — Ambulatory Visit (HOSPITAL_COMMUNITY): Payer: BC Managed Care – PPO

## 2012-08-07 ENCOUNTER — Ambulatory Visit (HOSPITAL_COMMUNITY): Payer: BC Managed Care – PPO

## 2012-08-12 ENCOUNTER — Ambulatory Visit (HOSPITAL_COMMUNITY): Payer: BC Managed Care – PPO

## 2012-08-14 ENCOUNTER — Ambulatory Visit (HOSPITAL_COMMUNITY): Payer: BC Managed Care – PPO

## 2012-08-17 ENCOUNTER — Ambulatory Visit
Admission: RE | Admit: 2012-08-17 | Discharge: 2012-08-17 | Disposition: A | Discharge status: Home / Self Care | Payer: BC Managed Care – PPO | Source: Ambulatory Visit | Attending: Family Medicine | Admitting: Family Medicine

## 2012-08-17 DIAGNOSIS — Z1231 Encounter for screening mammogram for malignant neoplasm of breast: Secondary | ICD-10-CM

## 2012-11-30 ENCOUNTER — Encounter (HOSPITAL_COMMUNITY): Payer: Self-pay | Admitting: Adult Health

## 2012-11-30 ENCOUNTER — Observation Stay (HOSPITAL_COMMUNITY)
Admission: EM | Admit: 2012-11-30 | Discharge: 2012-12-01 | DRG: 183 | Disposition: A | Discharge status: Home / Self Care | Payer: BC Managed Care – PPO | Attending: Interventional Cardiology | Admitting: Interventional Cardiology

## 2012-11-30 ENCOUNTER — Emergency Department (HOSPITAL_COMMUNITY): Payer: BC Managed Care – PPO

## 2012-11-30 DIAGNOSIS — J45909 Unspecified asthma, uncomplicated: Secondary | ICD-10-CM | POA: Insufficient documentation

## 2012-11-30 DIAGNOSIS — IMO0001 Reserved for inherently not codable concepts without codable children: Secondary | ICD-10-CM | POA: Insufficient documentation

## 2012-11-30 DIAGNOSIS — R51 Headache: Secondary | ICD-10-CM | POA: Insufficient documentation

## 2012-11-30 DIAGNOSIS — E119 Type 2 diabetes mellitus without complications: Secondary | ICD-10-CM | POA: Insufficient documentation

## 2012-11-30 DIAGNOSIS — Z9861 Coronary angioplasty status: Secondary | ICD-10-CM | POA: Insufficient documentation

## 2012-11-30 DIAGNOSIS — E785 Hyperlipidemia, unspecified: Secondary | ICD-10-CM | POA: Insufficient documentation

## 2012-11-30 DIAGNOSIS — F329 Major depressive disorder, single episode, unspecified: Secondary | ICD-10-CM | POA: Insufficient documentation

## 2012-11-30 DIAGNOSIS — R079 Chest pain, unspecified: Principal | ICD-10-CM | POA: Insufficient documentation

## 2012-11-30 DIAGNOSIS — F3289 Other specified depressive episodes: Secondary | ICD-10-CM | POA: Insufficient documentation

## 2012-11-30 DIAGNOSIS — E039 Hypothyroidism, unspecified: Secondary | ICD-10-CM | POA: Insufficient documentation

## 2012-11-30 DIAGNOSIS — K449 Diaphragmatic hernia without obstruction or gangrene: Secondary | ICD-10-CM | POA: Insufficient documentation

## 2012-11-30 DIAGNOSIS — I2510 Atherosclerotic heart disease of native coronary artery without angina pectoris: Secondary | ICD-10-CM | POA: Insufficient documentation

## 2012-11-30 DIAGNOSIS — K219 Gastro-esophageal reflux disease without esophagitis: Secondary | ICD-10-CM | POA: Insufficient documentation

## 2012-11-30 DIAGNOSIS — Z951 Presence of aortocoronary bypass graft: Secondary | ICD-10-CM | POA: Insufficient documentation

## 2012-11-30 DIAGNOSIS — I10 Essential (primary) hypertension: Secondary | ICD-10-CM | POA: Insufficient documentation

## 2012-11-30 DIAGNOSIS — Z79899 Other long term (current) drug therapy: Secondary | ICD-10-CM | POA: Insufficient documentation

## 2012-11-30 LAB — POCT I-STAT TROPONIN I: Troponin i, poc: 0 ng/mL (ref 0.00–0.08)

## 2012-11-30 LAB — CBC
MCH: 27.8 pg (ref 26.0–34.0)
MCHC: 33.5 g/dL (ref 30.0–36.0)
Platelets: 105 10*3/uL — ABNORMAL LOW (ref 150–400)
RBC: 4.49 MIL/uL (ref 3.87–5.11)
RDW: 14.1 % (ref 11.5–15.5)

## 2012-11-30 LAB — BASIC METABOLIC PANEL
Calcium: 9 mg/dL (ref 8.4–10.5)
GFR calc Af Amer: 78 mL/min — ABNORMAL LOW (ref >90)
GFR calc non Af Amer: 68 mL/min — ABNORMAL LOW (ref >90)
Sodium: 138 mEq/L (ref 135–145)

## 2012-11-30 MED ORDER — NITROGLYCERIN 2 % TD OINT
1.0000 [in_us] | TOPICAL_OINTMENT | Freq: Four times a day (QID) | TRANSDERMAL | Status: DC
  Administered 2012-11-30: 1 [in_us] via TOPICAL
  Filled 2012-11-30: qty 1
  Filled 2012-11-30: qty 30

## 2012-11-30 MED ORDER — ASPIRIN 325 MG PO TABS
325.0000 mg | ORAL_TABLET | ORAL | Status: AC
  Administered 2012-11-30: 325 mg via ORAL
  Filled 2012-11-30: qty 1

## 2012-11-30 MED ORDER — NITROGLYCERIN 0.4 MG SL SUBL: 0.4000 mg | SUBLINGUAL_TABLET | SUBLINGUAL | Status: DC | PRN

## 2012-11-30 MED ORDER — ONDANSETRON HCL 4 MG/2ML IJ SOLN
4.0000 mg | Freq: Once | INTRAMUSCULAR | Status: AC
  Administered 2012-11-30: 4 mg via INTRAVENOUS
  Filled 2012-11-30: qty 2

## 2012-11-30 MED ORDER — MORPHINE SULFATE 4 MG/ML IJ SOLN
4.0000 mg | Freq: Once | INTRAMUSCULAR | Status: AC
  Administered 2012-11-30: 4 mg via INTRAVENOUS
  Filled 2012-11-30: qty 1

## 2012-11-30 NOTE — ED Notes (Signed)
Patient is complaining of 4/10 chest pain.  Patient states that it has gotten better since she has been here.  Patient reports that she got sick on the way to radiology.

## 2012-11-30 NOTE — ED Provider Notes (Signed)
History     CSN: [id number]  Arrival date & time 11/30/12  2112   First MD Initiated Contact with Patient 11/30/12 2259      Chief Complaint  Patient presents with  . Chest Pain    (Consider location/radiation/quality/duration/timing/severity/associated sxs/prior treatment) Patient is a 61 y.o. female presenting with chest pain. The history is provided by the patient and a relative.  Chest Pain The chest pain began 3 - 5 hours ago. Duration of episode(s) is 5 minutes. Chest pain occurs intermittently. The chest pain is worsening. At its most intense, the pain is at 10/10. The pain is currently at 3/10. The severity of the pain is moderate. The quality of the pain is described as aching. The pain radiates to the left jaw, left neck and left shoulder. Pertinent negatives for primary symptoms include no fever, no fatigue, no syncope, no shortness of breath, no cough, no wheezing, no palpitations, no abdominal pain, no nausea, no vomiting, no dizziness and no altered mental status. She tried nitroglycerin and aspirin for the symptoms.  Her past medical history is significant for CAD, diabetes and MI.     Past Medical History  Diagnosis Date  . Hypertension   . Hypothyroid   . Fibromyalgia   . Hypercholesteremia   . Angina   . GERD (gastroesophageal reflux disease)   . Headache   . Depression   . H/O hiatal hernia   . Asthma   . PONV (postoperative nausea and vomiting)   . Heart murmur   . Diabetes mellitus     type 2  . Coronary artery disease     s/p CABG  . Myocardial infarction 11/2011    Past Surgical History  Procedure Date  . Cholecystectomy   . Coronary artery bypass graft 11/27/2011    Procedure: CORONARY ARTERY BYPASS GRAFTING (CABG);  Surgeon: Delight Ovens, MD;  Location: [facility];  Service: Open Heart Surgery;  Laterality: N/A;  coronary artery bypass graft on pump times two utilizing left internal mammary artery and right saphenous vein harvested  endoscopically   . Knee surgery   . Coronary angioplasty with stent placement 05/06/2012    Family History  Problem Relation Age of Onset  . Heart disease Father 37    died of MI  . Peripheral vascular disease Mother 14    bilaterial amputations    History  Substance Use Topics  . Smoking status: Never Smoker   . Smokeless tobacco: Never Used  . Alcohol Use: No    OB History    Grav Para Term Preterm Abortions TAB SAB Ect Mult Living                  Review of Systems  Constitutional: Negative for fever and fatigue.  Respiratory: Negative for cough, shortness of breath and wheezing.   Cardiovascular: Positive for chest pain. Negative for palpitations and syncope.  Gastrointestinal: Negative for nausea, vomiting and abdominal pain.  Neurological: Negative for dizziness.  Psychiatric/Behavioral: Negative for altered mental status.  All other systems reviewed and are negative.    Allergies  Crestor and Exenatide  Home Medications   Current Outpatient Rx  Name  Route  Sig  Dispense  Refill  . ASPIRIN 81 MG PO CHEW   Oral   Chew 1 tablet (81 mg total) by mouth daily.   30 tablet   12   . DULOXETINE HCL 30 MG PO CPEP   Oral   Take 30 mg by mouth daily.          Marland Kitchen  DULOXETINE HCL 60 MG PO CPEP   Oral   Take 60 mg by mouth daily.         Marland Kitchen LEVOTHYROXINE SODIUM 50 MCG PO TABS   Oral   Take 50 mcg by mouth daily before breakfast.         . METFORMIN HCL 500 MG PO TABS   Oral   Take 1 tablet (500 mg total) by mouth 2 (two) times daily with a meal.         . METOPROLOL TARTRATE 25 MG PO TABS   Oral   Take 25 mg by mouth 2 (two) times daily.         Marland Kitchen NITROGLYCERIN 0.4 MG SL SUBL   Sublingual   Place 0.4 mg under the tongue every 5 (five) minutes as needed. For chest pain         . OMEPRAZOLE-SODIUM BICARBONATE 40-1100 MG PO CAPS   Oral   Take 1 capsule by mouth daily before breakfast.         . LIVALO PO   Oral   Take 2 mg by mouth  daily.          Marland Kitchen TICAGRELOR 90 MG PO TABS   Oral   Take 1 tablet (90 mg total) by mouth 2 (two) times daily.   60 tablet   12     BP 126/67  Pulse 80  Temp 99.1 F (37.3 C) (Oral)  Resp 17  SpO2 97%  Physical Exam  Constitutional: She is oriented to person, place, and time. She appears well-developed and well-nourished.  HENT:  Head: Normocephalic and atraumatic.  Eyes: Conjunctivae normal and EOM are normal. Pupils are equal, round, and reactive to light.  Neck: Normal range of motion.  Cardiovascular: Normal rate, regular rhythm and normal heart sounds.   Pulmonary/Chest: Effort normal and breath sounds normal.  Abdominal: Soft. Bowel sounds are normal.  Musculoskeletal: Normal range of motion.  Neurological: She is alert and oriented to person, place, and time.  Skin: Skin is warm and dry.  Psychiatric: She has a normal mood and affect. Her behavior is normal.    ED Course  Procedures (including critical care time)  Labs Reviewed  CBC - Abnormal; Notable for the following:    WBC 3.7 (*)     Platelets 105 (*)  PLATELET COUNT CONFIRMED BY SMEAR   All other components within normal limits  BASIC METABOLIC PANEL - Abnormal; Notable for the following:    Glucose, Bld 286 (*)     GFR calc non Af Amer 68 (*)     GFR calc Af Amer 78 (*)     All other components within normal limits  POCT I-STAT TROPONIN I   Dg Chest 2 View  11/30/2012  *RADIOLOGY REPORT*  Clinical Data: Central chest pain, extending into the neck, cough and T.  CHEST - 2 VIEW  Comparison: Chest radiograph performed 03/1961 1013  Findings: The lungs are well-aerated.  Mild peribronchial thickening is noted.  There is no evidence of focal opacification, pleural effusion or pneumothorax.  The heart is normal in size; the patient is status post median sternotomy.  No acute osseous abnormalities are seen.  IMPRESSION: Mild peribronchial thickening noted; lungs otherwise clear.   Original Report  Authenticated By: Tonia Ghent, M.D.      No diagnosis found.   Date: 11/30/2012  Rate: 85  Rhythm: normal sinus rhythm  QRS Axis: left  Intervals: normal  ST/T Wave abnormalities: nonspecific  ST changes  Conduction Disutrbances:none  Narrative Interpretation:   Old EKG Reviewed: unchanged   MDM  + cp similar to mi per pt.  Continuing chest pain.  Will analgesia,  Anticoag,  Admit.  CE neg x 1        Cattaleya Wien Lytle Michaels, MD 11/30/12 2313

## 2012-11-30 NOTE — ED Notes (Addendum)
Pt called for room but is currently in radiology. They will bring her back to the room when finished.

## 2012-11-30 NOTE — H&P (Signed)
Physician History and Physical    Denise Cuevas MRN: [id number] DOB/AGE: 1951/09/18 61 y.o. Admit date: 11/30/2012  Primary Cardiologist:  Dr. Eldridge Dace  CC:  Chest pain  HPI:  Pt is a 61 yo woman with HTN, HLD, DM, CAD s/p CABG (LIMA-LAD and SVG-OM (occluded)), CAD s/p PCI, last DES to distal LAD 5/13 who presents with an episode of chest pain starting today at 7pm.  Pt was folding laundry and walked upstairs and told her husband that she did not feel well.  She had 8/10 chest pain in the center of her chest that radiated across her chest and into her neck.  She describes it as a sharp pain.  The pain is similar to her prior MIs, except that it wasn't as severe.  With her prior MIs, the pain also radiated to her arms and this time it did not.  She was nauseous and vomited as well.  The pain did not improve with nitro.  Her pain is now 2/10 after morphine in the ED.  She reports that she has been having chest pain without radiation anywhere since her stent was placed 5/13, but this episode was bad because of the radiation and that is what worried her.  She initially had some SOB when the pain started and it hurt to breathe because her chest was heavy, but this has improved.  She denies any SOB now.  She has not traveled recently.  She has no other acute complaints.  Review of systems: A review of 10 organ systems was done and is negative except as stated above in HPI  Past Medical History  Diagnosis Date  . Hypertension   . Hypothyroid   . Fibromyalgia   . Hypercholesteremia   . Angina   . GERD (gastroesophageal reflux disease)   . Headache   . Depression   . H/O hiatal hernia   . Asthma   . PONV (postoperative nausea and vomiting)   . Heart murmur   . Diabetes mellitus     type 2  . Coronary artery disease     s/p CABG  . Myocardial infarction 11/2011   Past Surgical History  Procedure Date  . Cholecystectomy   . Coronary artery bypass graft 11/27/2011    Procedure:  CORONARY ARTERY BYPASS GRAFTING (CABG);  Surgeon: Delight Ovens, MD;  Location: [facility] OR;  Service: Open Heart Surgery;  Laterality: N/A;  coronary artery bypass graft on pump times two utilizing left internal mammary artery and right saphenous vein harvested endoscopically   . Knee surgery   . Coronary angioplasty with stent placement 05/06/2012   History   Social History  . Marital Status: Married    Spouse Name: N/A    Number of Children: N/A  . Years of Education: N/A   Occupational History  . Not on file.   Social History Main Topics  . Smoking status: Never Smoker   . Smokeless tobacco: Never Used  . Alcohol Use: No  . Drug Use: No  . Sexually Active: Yes   Other Topics Concern  . Not on file   Social History Narrative   Retired Consulting civil engineer    Family History  Problem Relation Age of Onset  . Heart disease Father 41    died of MI  . Peripheral vascular disease Mother 91    bilaterial amputations     Allergies  Allergen Reactions  . Crestor (Rosuvastatin Calcium) Nausea And Vomiting  .  Exenatide Nausea And Vomiting     (Not in a hospital admission)  Current facility-administered medications:morphine 4 MG/ML injection 4 mg, 4 mg, Intravenous, Once, Chionesu Lytle Michaels, MD;  nitroGLYCERIN (NITROGLYN) 2 % ointment 1 inch, 1 inch, Topical, Q6H, Chionesu Lytle Michaels, MD, 1 inch at 11/30/12 2326;  nitroGLYCERIN (NITROSTAT) SL tablet 0.4 mg, 0.4 mg, Sublingual, Q5 min PRN, Chionesu Lytle Michaels, MD;  ondansetron (ZOFRAN) injection 4 mg, 4 mg, Intravenous, Once, Chionesu Lytle Michaels, MD Current outpatient prescriptions:aspirin 81 MG chewable tablet, Chew 1 tablet (81 mg total) by mouth daily., Disp: 30 tablet, Rfl: 12;  DULoxetine (CYMBALTA) 30 MG capsule, Take 30 mg by mouth daily. , Disp: , Rfl: ;  DULoxetine (CYMBALTA) 60 MG capsule, Take 60 mg by mouth daily., Disp: , Rfl: ;  levothyroxine (SYNTHROID, LEVOTHROID) 50 MCG tablet, Take 50 mcg by mouth daily before  breakfast., Disp: , Rfl:  metFORMIN (GLUCOPHAGE) 500 MG tablet, Take 1 tablet (500 mg total) by mouth 2 (two) times daily with a meal., Disp: , Rfl: ;  metoprolol tartrate (LOPRESSOR) 25 MG tablet, Take 25 mg by mouth 2 (two) times daily., Disp: , Rfl: ;  nitroGLYCERIN (NITROSTAT) 0.4 MG SL tablet, Place 0.4 mg under the tongue every 5 (five) minutes as needed. For chest pain, Disp: , Rfl:  omeprazole-sodium bicarbonate (ZEGERID) 40-1100 MG per capsule, Take 1 capsule by mouth daily before breakfast., Disp: , Rfl: ;  Pitavastatin Calcium (LIVALO PO), Take 2 mg by mouth daily. , Disp: , Rfl: ;  Ticagrelor (BRILINTA) 90 MG TABS tablet, Take 1 tablet (90 mg total) by mouth 2 (two) times daily., Disp: 60 tablet, Rfl: 12  Physical Exam: Blood pressure 126/67, pulse 80, temperature 99.1 F (37.3 C), temperature source Oral, resp. rate 17, SpO2 97.00%.; There is no height or weight on file to calculate BMI. Temp:  [99.1 F (37.3 C)] 99.1 F (37.3 C) (12/23 2121) Pulse Rate:  [80-87] 80  (12/23 2230) Resp:  [17-20] 17  (12/23 2230) BP: (126-149)/(57-67) 126/67 mmHg (12/23 2230) SpO2:  [97 %-100 %] 97 % (12/23 2230)  No intake or output data in the 24 hours ending 11/30/12 2329 General: NAD Heent: MMM Neck: No JVD  CV: RRR, 2/6 SEM Lungs: Clear to auscultation bilaterally with normal respiratory effort Abdomen: Soft, nontender, nondistended Extremities: No clubbing or cyanosis. No pedal edema Skin: Intact without lesions or rashes  Neurologic: Alert and oriented, grossly nonfocal  Psych: Normal mood and affect    Labs: No results found for this basename: CKTOTAL:4,CKMB:4,TROPONINI:4 in the last 72 hours Lab Results  Component Value Date   WBC 3.7* 11/30/2012   HGB 12.5 11/30/2012   HCT 37.3 11/30/2012   MCV 83.1 11/30/2012   PLT 105* 11/30/2012    Lab 11/30/12 2145  NA 138  K 4.0  CL 102  CO2 29  BUN 13  CREATININE 0.91  CALCIUM 9.0  PROT --  BILITOT --  ALKPHOS --  ALT --    AST --  GLUCOSE 286*   Lab Results  Component Value Date   CHOL 176 03/25/2012   HDL 54 03/25/2012   LDLCALC 101* 03/25/2012   TRIG 104 03/25/2012    Trop 0.00   EKG:  Sinus, LVH, no ischemia   Cath:  5/13 PROCEDURE: Left heart catheterization with selective coronary angiography, intravascular ultrasound of the left circumflex and obtuse marginal, PCI of the distal LAD.  INDICATIONS:  The risks, benefits, and details of the procedure were explained to  the patient. The patient verbalized understanding and wanted to proceed. Informed written consent was obtained.  PROCEDURE TECHNIQUE: After Xylocaine anesthesia a 6 F sheath was placed in the right femoral artery with a single anterior needle wall stick. Left coronary angiography was done using a CLS 3.0 guide catheter. Right coronary angiography was done using a Judkins R4 guide catheter. They graft angiography was performed using a JR 4 catheter. The JR 4 catheter was used to engage the LIMA graft. Left heart catheterization was done using a pigtail catheter.  CONTRAST: Total of 240 cc.  COMPLICATIONS: None.  HEMODYNAMICS: Aortic pressure was 128/66; LV pressure was 129/3; LVEDP 9. There was no gradient between the left ventricle and aorta.  ANGIOGRAPHIC DATA: The left main coronary artery is short, but widely patent..  The left anterior descending artery is occluded after the origin of the large first diagonal. The first diagonal has a moderate, 30-40% stenosis proximally.  The left circumflex artery is a large vessel. The proximal stent is widely patent and appears well size. The distal stent in the large OM is also widely patent. The AV continuation artery is widely patent.  The right coronary artery is a medium-sized, dominant vessel. There is some catheter-induced spasm proximally.  The SVG to OM is occluded.  The LIMA to LAD is widely patent. In the anastomotic site, there is mild plaque in some views. In the distal LAD, there is a  focal 80% stenosis.  PCI NARRATIVE: Angiomax was given for anticoagulation. A CLS 3.0 guiding catheters used. A pro-water wire was placed down the circumflex into the large OM across both stents. An intravascular ultrasound catheter was advanced to the second more distal stent to the midportion. A pullback was performed. We did not advance it further due to the small nature of the very distal obtuse marginal. The intravascular ultrasound shows that the stents in the circumflex are well apposed. There are no stent struts which appear free from vessel wall.  And IMA guiding catheters used to engage the left internal mammary artery with some difficulty. A pro-water wire was placed into the LIMA into the LAD. This could not successfully navigate always into the distal LAD. A BMW wire was then advanced across the 80% stenosis in the distal LAD. This was predilated with a 2.0 x 12 emerge balloon. A 2.25 x 12 Promus stent was then inflated to 11 atmospheres for 18 seconds across the diseased area . There still appeared to be a waist in the stent. This postdilated with a 2.5 x 9 [place] sprinter balloon to 16 atmospheres. There was no residual stenosis. TIMI-3 flow was maintained throughout. Lesion length was 8 mm.  Severeal doses of IC NTG were given to treat vasospasm in the circumflex from the IVUS and in the LAD from the interventional equipment.  IMPRESSIONS:  1. Widely patent right coronary artery.  2. Successful drug-eluting stent placement to the distal left anterior descending artery. 3. Well apposed stents in the left circumflex artery and obtuse marginal. LVEDP 9 mm Hg. Left ventriculogram not performed.     Radiology:  Dg Chest 2 View  11/30/2012  *RADIOLOGY REPORT*  Clinical Data: Central chest pain, extending into the neck, cough and T.  CHEST - 2 VIEW  Comparison: Chest radiograph performed 03/1961 1013  Findings: The lungs are well-aerated.  Mild peribronchial thickening is noted.  There is no  evidence of focal opacification, pleural effusion or pneumothorax.  The heart is normal in size; the patient is  status post median sternotomy.  No acute osseous abnormalities are seen.  IMPRESSION: Mild peribronchial thickening noted; lungs otherwise clear.   Original Report Authenticated By: Tonia Ghent, M.D.     ASSESSMENT: Pt is a 61 yo woman with HTN, HLD, DM, CAD s/p CABG (LIMA-LAD and SVG-OM (occluded)), CAD s/p PCI, last DES to distal LAD 5/13 who presents with an episode of chest pain typical of her prior cardiac pain  PLAN:  Chest pain - concerning for ACS given description similar to prior MIs.  EKG with no acute ischemia, first set of enzymes negative - s/p aspirin 325 mg in ED - continue aspirin 81 mg daily - continue ticagrelor - start heparin ACS nomogram (pharmacy consult for dosing) - lipitor 80 mg (we dont have pt's statin on formulary) - continue home BB - morphine and nitro prn pain  - NPO p MN for cath in am - serial EKGs and cardiac enzymes - check echo (no recent study)  DM - hold metformin, SSI  HTN - continue home meds  HLD - switch statin to lipitor for now (her statin is nonformulary).  Check lipids in am  Hypothyroid - continue home meds  Depression - continue home meds  Dispo - admit to [place] service   Signed: Hilary Hertz, MD Cardiology Fellow 11/30/2012, 11:29 PM

## 2012-11-30 NOTE — ED Notes (Signed)
Presents with sternal chest pain that radiates into neck and into jaw as well as both arms. similair pain to MI X 2 this year.associated with Nausea and pain with deep inspiration. Pain not relieved by nitro at home. Pain is constant, nothing makes pain better. Breathing makes pain worse.

## 2012-11-30 NOTE — ED Notes (Signed)
Patient states that she took 2 Nitro at home before coming to the ED.  The nitro did not help relieve the pain completely.

## 2012-12-01 ENCOUNTER — Inpatient Hospital Stay (HOSPITAL_COMMUNITY): Payer: BC Managed Care – PPO

## 2012-12-01 ENCOUNTER — Encounter (HOSPITAL_COMMUNITY): Payer: Self-pay | Admitting: *Deleted

## 2012-12-01 LAB — CBC
Platelets: 105 10*3/uL — ABNORMAL LOW (ref 150–400)
RBC: 4.18 MIL/uL (ref 3.87–5.11)
RDW: 14.1 % (ref 11.5–15.5)
WBC: 4.9 10*3/uL (ref 4.0–10.5)

## 2012-12-01 LAB — BASIC METABOLIC PANEL
CO2: 26 mEq/L (ref 19–32)
Calcium: 8.8 mg/dL (ref 8.4–10.5)
Chloride: 102 mEq/L (ref 96–112)
GFR calc Af Amer: >90 mL/min (ref >90)
Sodium: 138 mEq/L (ref 135–145)

## 2012-12-01 LAB — D-DIMER, QUANTITATIVE: D-Dimer, Quant: <0.27 ug/mL-FEU (ref 0.00–0.48)

## 2012-12-01 LAB — GLUCOSE, CAPILLARY
Glucose-Capillary: 168 mg/dL — ABNORMAL HIGH (ref 70–99)
Glucose-Capillary: 264 mg/dL — ABNORMAL HIGH (ref 70–99)

## 2012-12-01 LAB — PROTIME-INR
INR: 1.14 (ref 0.00–1.49)
Prothrombin Time: 14.4 seconds (ref 11.6–15.2)

## 2012-12-01 LAB — LIPID PANEL
Total CHOL/HDL Ratio: 2.6 RATIO
VLDL: 9 mg/dL (ref 0–40)

## 2012-12-01 MED ORDER — DULOXETINE HCL 60 MG PO CPEP: 60.0000 mg | ORAL_CAPSULE | Freq: Every day | ORAL | Status: DC

## 2012-12-01 MED ORDER — NITROGLYCERIN 0.4 MG SL SUBL: 0.4000 mg | SUBLINGUAL_TABLET | SUBLINGUAL | Status: DC | PRN

## 2012-12-01 MED ORDER — TICAGRELOR 90 MG PO TABS
90.0000 mg | ORAL_TABLET | Freq: Two times a day (BID) | ORAL | Status: DC
  Administered 2012-12-01: 90 mg via ORAL
  Filled 2012-12-01 (×2): qty 1

## 2012-12-01 MED ORDER — MORPHINE SULFATE 2 MG/ML IJ SOLN: 2.0000 mg | INTRAMUSCULAR | Status: DC | PRN

## 2012-12-01 MED ORDER — METOPROLOL TARTRATE 25 MG PO TABS: 25.0000 mg | ORAL_TABLET | Freq: Two times a day (BID) | ORAL | Status: DC

## 2012-12-01 MED ORDER — PANTOPRAZOLE SODIUM 40 MG PO TBEC
40.0000 mg | DELAYED_RELEASE_TABLET | Freq: Every day | ORAL | Status: DC
  Administered 2012-12-01: 40 mg via ORAL
  Filled 2012-12-01: qty 1

## 2012-12-01 MED ORDER — HEPARIN BOLUS VIA INFUSION
4000.0000 [IU] | Freq: Once | INTRAVENOUS | Status: AC
  Administered 2012-12-01: 4000 [IU] via INTRAVENOUS
  Filled 2012-12-01: qty 4000

## 2012-12-01 MED ORDER — HEPARIN BOLUS VIA INFUSION
2000.0000 [IU] | Freq: Once | INTRAVENOUS | Status: DC
  Filled 2012-12-01: qty 2000

## 2012-12-01 MED ORDER — DULOXETINE HCL 60 MG PO CPEP
90.0000 mg | ORAL_CAPSULE | Freq: Every day | ORAL | Status: DC
  Administered 2012-12-01: 90 mg via ORAL
  Filled 2012-12-01: qty 1

## 2012-12-01 MED ORDER — TECHNETIUM TC 99M SESTAMIBI GENERIC - CARDIOLITE
30.0000 | Freq: Once | INTRAVENOUS | Status: AC | PRN
  Administered 2012-12-01: 30 via INTRAVENOUS

## 2012-12-01 MED ORDER — TECHNETIUM TC 99M SESTAMIBI GENERIC - CARDIOLITE
10.0000 | Freq: Once | INTRAVENOUS | Status: AC | PRN
  Administered 2012-12-01: 10 via INTRAVENOUS

## 2012-12-01 MED ORDER — REGADENOSON 0.4 MG/5ML IV SOLN
INTRAVENOUS | Status: AC
  Administered 2012-12-01: 0.4 mg
  Filled 2012-12-01: qty 5

## 2012-12-01 MED ORDER — DULOXETINE HCL 30 MG PO CPEP: 30.0000 mg | ORAL_CAPSULE | Freq: Every day | ORAL | Status: DC

## 2012-12-01 MED ORDER — LEVOTHYROXINE SODIUM 50 MCG PO TABS
50.0000 ug | ORAL_TABLET | Freq: Every day | ORAL | Status: DC
  Administered 2012-12-01: 50 ug via ORAL
  Filled 2012-12-01 (×3): qty 1

## 2012-12-01 MED ORDER — METOPROLOL TARTRATE 25 MG PO TABS
25.0000 mg | ORAL_TABLET | Freq: Two times a day (BID) | ORAL | Status: DC
  Administered 2012-12-01: 25 mg via ORAL
  Filled 2012-12-01 (×3): qty 1

## 2012-12-01 MED ORDER — ASPIRIN EC 81 MG PO TBEC
81.0000 mg | DELAYED_RELEASE_TABLET | Freq: Every day | ORAL | Status: DC
  Administered 2012-12-01: 81 mg via ORAL
  Filled 2012-12-01: qty 1

## 2012-12-01 MED ORDER — HEPARIN (PORCINE) IN NACL 100-0.45 UNIT/ML-% IJ SOLN
1000.0000 [IU]/h | INTRAMUSCULAR | Status: DC
  Administered 2012-12-01: 800 [IU]/h via INTRAVENOUS
  Filled 2012-12-01: qty 250

## 2012-12-01 MED ORDER — ONDANSETRON HCL 4 MG/2ML IJ SOLN: 4.0000 mg | Freq: Four times a day (QID) | INTRAMUSCULAR | Status: DC | PRN

## 2012-12-01 MED ORDER — ATORVASTATIN CALCIUM 80 MG PO TABS
80.0000 mg | ORAL_TABLET | Freq: Every day | ORAL | Status: DC
  Filled 2012-12-01: qty 1

## 2012-12-01 MED ORDER — ACETAMINOPHEN 325 MG PO TABS
650.0000 mg | ORAL_TABLET | ORAL | Status: DC | PRN
  Administered 2012-12-01: 650 mg via ORAL
  Filled 2012-12-01: qty 2

## 2012-12-01 MED ORDER — INSULIN ASPART 100 UNIT/ML ~~LOC~~ SOLN
0.0000 [IU] | Freq: Three times a day (TID) | SUBCUTANEOUS | Status: DC
  Administered 2012-12-01: 8 [IU] via SUBCUTANEOUS

## 2012-12-01 MED ORDER — ASPIRIN 81 MG PO CHEW: 81.0000 mg | CHEWABLE_TABLET | Freq: Every day | ORAL | Status: DC

## 2012-12-01 NOTE — Progress Notes (Signed)
SUBJECTIVE:  No further chest pain  OBJECTIVE:   Vitals:   Filed Vitals:   12/01/12 0030 12/01/12 0100 12/01/12 0140 12/01/12 0527  BP: 128/68 133/58 135/74 102/64  Pulse: 86 83 80 65  Temp:   98.1 F (36.7 C) 97.9 F (36.6 C)  TempSrc:   Oral Oral  Resp: 17 20 18 18   Height:   5\' 2"  (1.575 m)   Weight:   77.565 kg (171 lb)   SpO2: 95% 95% 96% 95%   I&O's:  No intake or output data in the 24 hours ending 12/01/12 0909 TELEMETRY: Reviewed telemetry pt in NSR:     PHYSICAL EXAM General: Well developed, well nourished, in no acute distress Head: Eyes PERRLA, No xanthomas.   Normal cephalic and atramatic  Lungs:   Clear bilaterally to auscultation and percussion. Heart:   HRRR S1 S2 Pulses are 2+ & equal. Abdomen: Bowel sounds are positive, abdomen soft and non-tender without masses Extremities:   No clubbing, cyanosis or edema.  DP +1 Neuro: Alert and oriented X 3. Psych:  Good affect, responds appropriately   LABS: Basic Metabolic Panel:  Basename 12/01/12 0620 11/30/12 2145  NA 138 138  K 3.8 4.0  CL 102 102  CO2 26 29  GLUCOSE 193* 286*  BUN 13 13  CREATININE 0.81 0.91  CALCIUM 8.8 9.0  MG -- --  PHOS -- --   Liver Function Tests: No results found for this basename: AST:2,ALT:2,ALKPHOS:2,BILITOT:2,PROT:2,ALBUMIN:2 in the last 72 hours No results found for this basename: LIPASE:2,AMYLASE:2 in the last 72 hours CBC:  Basename 12/01/12 0620 11/30/12 2145  WBC 4.9 3.7*  NEUTROABS -- --  HGB 11.6* 12.5  HCT 34.6* 37.3  MCV 82.8 83.1  PLT 105* 105*   Cardiac Enzymes:  Basename 12/01/12 0152 11/30/12 2307  CKTOTAL -- 60  CKMB -- 2.2  CKMBINDEX -- --  TROPONINI <0.30 --   Fasting Lipid Panel:  Basename 12/01/12 0620  CHOL 152  HDL 59  LDLCALC 84  TRIG 46  CHOLHDL 2.6  LDLDIRECT --   Coag Panel:   Lab Results  Component Value Date   INR 1.14 12/01/2012   INR 1.06 11/30/2012   INR 1.42 03/25/2012    RADIOLOGY: Dg Chest 2 View  11/30/2012   *RADIOLOGY REPORT*  Clinical Data: Central chest pain, extending into the neck, cough and T.  CHEST - 2 VIEW  Comparison: Chest radiograph performed 03/1961 1013  Findings: The lungs are well-aerated.  Mild peribronchial thickening is noted.  There is no evidence of focal opacification, pleural effusion or pneumothorax.  The heart is normal in size; the patient is status post median sternotomy.  No acute osseous abnormalities are seen.  IMPRESSION: Mild peribronchial thickening noted; lungs otherwise clear.   Original Report Authenticated By: Tonia Ghent, M.D.       ASSESSMENT:  1.  Chest pain similar to her cardiac pain in the past but less severe.  Cardiac enzymes thus far are negative 2.  CAD s/p CABG with LIMA to LAD and SVG to left circ 11/2011 followed by NSTEMI 03/2012 at which time cath showed high grade stenosis of SVG to OM s/p DES to proximal left circ and repeat cath 04/2012 showing widely patent RCA, well opposed stents in left circ/OM and underwent PCI of the distal LAD 3.  HTN 4.  Hypothyroidism 5.  GERD with hiatal hernia 6.  DM  PLAN:   1.  Will get another set of cardiac enzymes and if  negative will get a nuclear stress test today to rule out ischemia  Quintella Reichert, MD  12/01/2012  9:09 AM

## 2012-12-01 NOTE — Discharge Summary (Signed)
Patient ID: Denise Cuevas MRN: [id number] DOB/AGE: October 02, 1951 [age] y.o.  Admit date: 11/30/2012 Discharge date: 12/01/2012  Primary Discharge Diagnosis  Chest pain most likely secondary to GERD with esophageal spasm Secondary Discharge Diagnosis  CAD s/p CABG and PCI of distal LAD and Lecft circumflex  HTN  DM  Dyslipidemia  GERD with Hiatal hernia  Hypothyroidism  Fibromyalgia  Headaches  Depression  Asthma  PONV      Significant Diagnostic Studies: nuclear medicine: no ischemia, normal LVF  Consults: None  Hospital Course: Pt is a 61 yo woman with HTN, HLD, DM, CAD s/p CABG (LIMA-LAD and SVG-OM (occluded)), CAD s/p PCI, last DES to distal LAD 5/13 who presented with an episode of chest pain starting last PM at 7pm. Pt was folding laundry and walked upstairs and told her husband that she did not feel well. She had 8/10 chest pain in the center of her chest that radiated across her chest and into her neck. She described it as a sharp pain. The pain is similar to her prior MIs, except that it wasn't as severe. With her prior MIs, the pain also radiated to her arms and this time it did not. She was nauseous and vomited as well. The pain did not improve with nitro. She reports that she had been having chest pain without radiation anywhere since her stent was placed 5/13, but this episode was bad because of the radiation and that is what worried her. She initially had some SOB when the pain started and it hurt to breathe because her chest was heavy, but this has improved.  Her CP resolved after admission.  She ruled out for MI by serial enzymes and EKG showed no ischemia.  She underwent Lexiscan myoview without complications. The stress test showed no inducible ischemia and normal LVF.  She was subsequently discharged to home in stable condition.     Discharge Exam: Blood pressure 102/64, pulse 65, temperature 97.9 F (36.6 C), temperature source Oral, resp. rate 18, height 5\' 2"  (1.575  m), weight 77.565 kg (171 lb), SpO2 95.00%.   General appearance: alert Resp: clear to auscultation bilaterally Cardio: regular rate and rhythm, S1, S2 normal, no murmur, click, rub or gallop GI: soft, non-tender; bowel sounds normal; no masses,  no organomegaly Extremities: extremities normal, atraumatic, no cyanosis or edema Labs:   Lab Results  Component Value Date   WBC 4.9 12/01/2012   HGB 11.6* 12/01/2012   HCT 34.6* 12/01/2012   MCV 82.8 12/01/2012   PLT 105* 12/01/2012    Lab 12/01/12 0620  NA 138  K 3.8  CL 102  CO2 26  BUN 13  CREATININE 0.81  CALCIUM 8.8  PROT --  BILITOT --  ALKPHOS --  ALT --  AST --  GLUCOSE 193*   Lab Results  Component Value Date   CKTOTAL 60 11/30/2012   CKMB 2.2 11/30/2012   TROPONINI <0.30 12/01/2012    Lab Results  Component Value Date   CHOL 152 12/01/2012   CHOL 176 03/25/2012   CHOL 179 11/27/2011   Lab Results  Component Value Date   HDL 59 12/01/2012   HDL 54 03/25/2012   HDL 60 96/29/5284   Lab Results  Component Value Date   LDLCALC 84 12/01/2012   LDLCALC 101* 03/25/2012   LDLCALC 100* 11/27/2011   Lab Results  Component Value Date   TRIG 46 12/01/2012   TRIG 104 03/25/2012   TRIG 93 11/27/2011   Lab Results  Component Value Date   CHOLHDL 2.6 12/01/2012   CHOLHDL 3.3 03/25/2012   CHOLHDL 3.0 11/27/2011   No results found for this basename: LDLDIRECT      Radiology:  *RADIOLOGY REPORT*  Clinical Data: Central chest pain, extending into the neck, cough  and T.  CHEST - 2 VIEW  Comparison: Chest radiograph performed 03/1961 1013  Findings: The lungs are well-aerated. Mild peribronchial  thickening is noted. There is no evidence of focal opacification,  pleural effusion or pneumothorax.  The heart is normal in size; the patient is status post median  sternotomy. No acute osseous abnormalities are seen.  IMPRESSION:  Mild peribronchial thickening noted; lungs otherwise clear.  Original Report  Authenticated By: Tonia Ghent, M.D.   EKG:  NSR  FOLLOW UP PLANS AND APPOINTMENTS Discharge Orders    Future Appointments: Provider: Department: Dept Phone: Center:   12/01/2012 1:10 PM [facility] NUCLEAR [facility]IC[facility] [phone] [facility]       Medication List     As of 12/01/2012 12:00 PM    TAKE these medications         aspirin 81 MG chewable tablet   Chew 1 tablet (81 mg total) by mouth daily.      DULoxetine 30 MG capsule   Commonly known as: CYMBALTA   Take 30 mg by mouth daily.      DULoxetine 60 MG capsule   Commonly known as: CYMBALTA   Take 60 mg by mouth daily.      levothyroxine 50 MCG tablet   Commonly known as: SYNTHROID, LEVOTHROID   Take 50 mcg by mouth daily before breakfast.      LIVALO PO   Take 2 mg by mouth daily.      metFORMIN 500 MG tablet   Commonly known as: GLUCOPHAGE   Take 1 tablet (500 mg total) by mouth 2 (two) times daily with a meal.      metoprolol tartrate 25 MG tablet   Commonly known as: LOPRESSOR   Take 1 tablet (25 mg total) by mouth 2 (two) times daily.      nitroGLYCERIN 0.4 MG SL tablet   Commonly known as: NITROSTAT   Place 0.4 mg under the tongue every 5 (five) minutes as needed. For chest pain      omeprazole-sodium bicarbonate 40-1100 MG per capsule   Commonly known as: ZEGERID   Take 1 capsule by mouth daily before breakfast.      Ticagrelor 90 MG Tabs tablet   Commonly known as: BRILINTA   Take 1 tablet (90 mg total) by mouth 2 (two) times daily.           Follow-up Information    Call Donato Schultz, MD. (call for an appointment with Dr. Anne Fu in 1-2 weeks)    Contact information:   [address] [place] [address] [phone]          BRING ALL MEDICATIONS WITH YOU TO FOLLOW UP APPOINTMENTS  Time spent with patient to include physician time:  30 minutes Signed: TURNER,TRACI R 12/01/2012, 12:00 PM

## 2012-12-01 NOTE — Progress Notes (Signed)
Pt and pt family provided with dc instructions and education. Pt aware to follow up with Dr. Anne Fu in 1-2 weeks. Pt  Denies CP/SOB. Pt has no complaints at this time. IV removed with tip intact. Heart monitor cleaned and returned to front. Pt leaving by foot with family for home. Levonne Spiller, RN

## 2012-12-01 NOTE — Progress Notes (Signed)
ANTICOAGULATION CONSULT NOTE - Initial Consult  Pharmacy Consult for heparin Indication: chest pain/ACS  Allergies  Allergen Reactions  . Crestor (Rosuvastatin Calcium) Nausea And Vomiting  . Exenatide Nausea And Vomiting    Patient Measurements: Height: 5\' 2"  (157.5 cm) Weight: 171 lb (77.565 kg) IBW/kg (Calculated) : 50.1  Heparin Dosing Weight: 67 kg  Vital Signs: Temp: 98.1 F (36.7 C) (12/24 0140) Temp src: Oral (12/24 0140) BP: 135/74 mmHg (12/24 0140) Pulse Rate: 80  (12/24 0140)  Labs:  Basename 11/30/12 2322 11/30/12 2307 11/30/12 2145  HGB -- -- 12.5  HCT -- -- 37.3  PLT -- -- 105*  APTT 32 -- --  LABPROT 13.7 -- --  INR 1.06 -- --  HEPARINUNFRC -- -- --  CREATININE -- -- 0.91  CKTOTAL -- 60 --  CKMB -- 2.2 --  TROPONINI -- -- --    Estimated Creatinine Clearance: 64.2 ml/min (by C-G formula based on Cr of 0.91).   Medical History: Past Medical History  Diagnosis Date  . Hypertension   . Hypothyroid   . Fibromyalgia   . Hypercholesteremia   . Angina   . GERD (gastroesophageal reflux disease)   . Headache   . Depression   . H/O hiatal hernia   . Asthma   . PONV (postoperative nausea and vomiting)   . Heart murmur   . Diabetes mellitus     type 2  . Coronary artery disease     s/p CABG  . Myocardial infarction 11/2011    Medications:  Scheduled:    . aspirin EC  81 mg Oral Daily  . [COMPLETED] aspirin  325 mg Oral STAT  . atorvastatin  80 mg Oral q1800  . DULoxetine  90 mg Oral Daily  . insulin aspart  0-15 Units Subcutaneous TID WC  . levothyroxine  50 mcg Oral QAC breakfast  . metoprolol tartrate  25 mg Oral BID  . [COMPLETED]  morphine injection  4 mg Intravenous Once  . nitroGLYCERIN  1 inch Topical Q6H  . [COMPLETED] ondansetron (ZOFRAN) IV  4 mg Intravenous Once  . [COMPLETED] ondansetron (ZOFRAN) IV  4 mg Intravenous Once  . pantoprazole  40 mg Oral Daily  . Ticagrelor  90 mg Oral BID  . [DISCONTINUED] aspirin  81 mg Oral  Daily  . [DISCONTINUED] DULoxetine  30 mg Oral Daily  . [DISCONTINUED] DULoxetine  60 mg Oral Daily    Assessment: 61 yo female admitted with r/o ACS. Pharmacy to manage heparin. Baseline Hgb 12.5, platelet 105, INR 1.06.   Goal of Therapy:  Heparin level 0.3-0.7 units/ml Monitor platelets by anticoagulation protocol: Yes   Plan:  1. Heparin IV bolus of 4000 units x 1, then IV infusion of 800 units/hr.  2. Heparin level in 6 hours. 3. Daily CBC, heparin level.   Emeline Gins 12/01/2012,2:04 AM

## 2012-12-01 NOTE — Progress Notes (Signed)
ANTICOAGULATION CONSULT NOTE  Pharmacy Consult for heparin Indication: chest pain/ACS  Allergies  Allergen Reactions  . Crestor (Rosuvastatin Calcium) Nausea And Vomiting  . Exenatide Nausea And Vomiting   Labs:  Alvira Philips 12/01/12 0843 12/01/12 0620 12/01/12 0152 11/30/12 2322 11/30/12 2307 11/30/12 2145  HGB -- 11.6* -- -- -- 12.5  HCT -- 34.6* -- -- -- 37.3  PLT -- 105* -- -- -- 105*  APTT -- -- -- 32 -- --  LABPROT -- 14.4 -- 13.7 -- --  INR -- 1.14 -- 1.06 -- --  HEPARINUNFRC 0.15* -- -- -- -- --  CREATININE -- 0.81 -- -- -- 0.91  CKTOTAL -- -- -- -- 60 --  CKMB -- -- -- -- 2.2 --  TROPONINI <0.30 -- <0.30 -- -- --    Estimated Creatinine Clearance: 72.1 ml/min (by C-G formula based on Cr of 0.81).   Assessment: 61 yo female admitted with r/o ACS. Pharmacy to manage heparin.   First heparin level low at 0.15, no bleeding noted  Goal of Therapy:  Heparin level 0.3-0.7 units/ml Monitor platelets by anticoagulation protocol: Yes   Plan:  1. Heparin IV bolus of 2000 units x 1, then IV infusion of 1000 units/hr.  2. Heparin level in 6 hours. 3. Daily CBC, heparin level.   Thank you. Okey Regal, PharmD [phone]  12/01/2012,10:42 AM

## 2012-12-01 NOTE — Progress Notes (Signed)
Utilization review completed.  

## 2013-02-03 DIAGNOSIS — R413 Other amnesia: Secondary | ICD-10-CM

## 2013-02-10 DIAGNOSIS — R413 Other amnesia: Secondary | ICD-10-CM

## 2013-02-10 DIAGNOSIS — F331 Major depressive disorder, recurrent, moderate: Secondary | ICD-10-CM

## 2013-02-25 ENCOUNTER — Other Ambulatory Visit (HOSPITAL_COMMUNITY): Payer: Self-pay | Admitting: Podiatry

## 2013-02-25 DIAGNOSIS — E1110 Type 2 diabetes mellitus with ketoacidosis without coma: Secondary | ICD-10-CM

## 2013-02-26 ENCOUNTER — Ambulatory Visit (HOSPITAL_COMMUNITY)
Admission: RE | Admit: 2013-02-26 | Discharge: 2013-02-26 | Disposition: A | Discharge status: Home / Self Care | Payer: BC Managed Care – PPO | Source: Ambulatory Visit | Attending: Cardiovascular Disease | Admitting: Cardiovascular Disease

## 2013-02-26 DIAGNOSIS — E1110 Type 2 diabetes mellitus with ketoacidosis without coma: Secondary | ICD-10-CM

## 2013-02-26 DIAGNOSIS — R0989 Other specified symptoms and signs involving the circulatory and respiratory systems: Secondary | ICD-10-CM | POA: Insufficient documentation

## 2013-02-26 DIAGNOSIS — E1310 Other specified diabetes mellitus with ketoacidosis without coma: Secondary | ICD-10-CM | POA: Insufficient documentation

## 2013-02-26 NOTE — Progress Notes (Signed)
Arterial Lower Ext. Duplex Doppler Completed. Denise Cuevas

## 2013-03-22 ENCOUNTER — Ambulatory Visit: Payer: Self-pay | Admitting: Nurse Practitioner

## 2013-04-21 ENCOUNTER — Other Ambulatory Visit: Payer: Self-pay | Admitting: Dermatology

## 2013-10-13 ENCOUNTER — Encounter: Payer: Self-pay | Admitting: Cardiology

## 2013-10-13 DIAGNOSIS — R011 Cardiac murmur, unspecified: Secondary | ICD-10-CM | POA: Insufficient documentation

## 2013-10-13 DIAGNOSIS — F329 Major depressive disorder, single episode, unspecified: Secondary | ICD-10-CM | POA: Insufficient documentation

## 2013-10-13 DIAGNOSIS — K219 Gastro-esophageal reflux disease without esophagitis: Secondary | ICD-10-CM | POA: Insufficient documentation

## 2013-10-14 ENCOUNTER — Encounter (HOSPITAL_COMMUNITY): Payer: Self-pay | Admitting: Emergency Medicine

## 2013-10-14 ENCOUNTER — Emergency Department (HOSPITAL_COMMUNITY)
Admission: EM | Admit: 2013-10-14 | Discharge: 2013-10-14 | Disposition: A | Discharge status: Home / Self Care | Payer: BC Managed Care – PPO | Attending: Emergency Medicine | Admitting: Emergency Medicine

## 2013-10-14 ENCOUNTER — Emergency Department (HOSPITAL_COMMUNITY): Payer: BC Managed Care – PPO

## 2013-10-14 DIAGNOSIS — Z951 Presence of aortocoronary bypass graft: Secondary | ICD-10-CM | POA: Insufficient documentation

## 2013-10-14 DIAGNOSIS — J45901 Unspecified asthma with (acute) exacerbation: Secondary | ICD-10-CM | POA: Insufficient documentation

## 2013-10-14 DIAGNOSIS — Z794 Long term (current) use of insulin: Secondary | ICD-10-CM | POA: Insufficient documentation

## 2013-10-14 DIAGNOSIS — I10 Essential (primary) hypertension: Secondary | ICD-10-CM | POA: Insufficient documentation

## 2013-10-14 DIAGNOSIS — R011 Cardiac murmur, unspecified: Secondary | ICD-10-CM | POA: Insufficient documentation

## 2013-10-14 DIAGNOSIS — E119 Type 2 diabetes mellitus without complications: Secondary | ICD-10-CM | POA: Insufficient documentation

## 2013-10-14 DIAGNOSIS — Z79899 Other long term (current) drug therapy: Secondary | ICD-10-CM | POA: Insufficient documentation

## 2013-10-14 DIAGNOSIS — E039 Hypothyroidism, unspecified: Secondary | ICD-10-CM | POA: Insufficient documentation

## 2013-10-14 DIAGNOSIS — I2510 Atherosclerotic heart disease of native coronary artery without angina pectoris: Secondary | ICD-10-CM | POA: Insufficient documentation

## 2013-10-14 DIAGNOSIS — Z792 Long term (current) use of antibiotics: Secondary | ICD-10-CM | POA: Insufficient documentation

## 2013-10-14 DIAGNOSIS — Z8659 Personal history of other mental and behavioral disorders: Secondary | ICD-10-CM | POA: Insufficient documentation

## 2013-10-14 DIAGNOSIS — R259 Unspecified abnormal involuntary movements: Secondary | ICD-10-CM | POA: Insufficient documentation

## 2013-10-14 DIAGNOSIS — Z7982 Long term (current) use of aspirin: Secondary | ICD-10-CM | POA: Insufficient documentation

## 2013-10-14 DIAGNOSIS — J40 Bronchitis, not specified as acute or chronic: Secondary | ICD-10-CM

## 2013-10-14 DIAGNOSIS — I252 Old myocardial infarction: Secondary | ICD-10-CM | POA: Insufficient documentation

## 2013-10-14 DIAGNOSIS — K219 Gastro-esophageal reflux disease without esophagitis: Secondary | ICD-10-CM | POA: Insufficient documentation

## 2013-10-14 LAB — CBC
HCT: 35 % — ABNORMAL LOW (ref 36.0–46.0)
MCV: 77.3 fL — ABNORMAL LOW (ref 78.0–100.0)
Platelets: 87 10*3/uL — ABNORMAL LOW (ref 150–400)
RBC: 4.53 MIL/uL (ref 3.87–5.11)
WBC: 5.2 10*3/uL (ref 4.0–10.5)

## 2013-10-14 LAB — BASIC METABOLIC PANEL
CO2: 25 mEq/L (ref 19–32)
Chloride: 101 mEq/L (ref 96–112)
Creatinine, Ser: 0.91 mg/dL (ref 0.50–1.10)
GFR calc Af Amer: 78 mL/min — ABNORMAL LOW (ref >90)
Potassium: 4.5 mEq/L (ref 3.5–5.1)

## 2013-10-14 LAB — POCT I-STAT TROPONIN I: Troponin i, poc: 0.01 ng/mL (ref 0.00–0.08)

## 2013-10-14 LAB — PRO B NATRIURETIC PEPTIDE: Pro B Natriuretic peptide (BNP): 146.7 pg/mL — ABNORMAL HIGH (ref 0–125)

## 2013-10-14 MED ORDER — ALBUTEROL SULFATE HFA 108 (90 BASE) MCG/ACT IN AERS: 2.0000 | INHALATION_SPRAY | RESPIRATORY_TRACT | Status: DC | PRN

## 2013-10-14 MED ORDER — ALBUTEROL SULFATE HFA 108 (90 BASE) MCG/ACT IN AERS
2.0000 | INHALATION_SPRAY | Freq: Once | RESPIRATORY_TRACT | Status: AC
  Administered 2013-10-14: 2 via RESPIRATORY_TRACT
  Filled 2013-10-14: qty 6.7

## 2013-10-14 MED ORDER — SODIUM CHLORIDE 0.9 % IV BOLUS (SEPSIS)
500.0000 mL | Freq: Once | INTRAVENOUS | Status: AC
  Administered 2013-10-14: 500 mL via INTRAVENOUS

## 2013-10-14 NOTE — ED Notes (Signed)
Pt sts SOB x 3 days worse today; pt sts diagnosed with bronchitis yesterday but no better today; pt sts 4 episodes of generalized shaking that pt is aware of; pt sts hx of MI

## 2013-10-14 NOTE — ED Provider Notes (Signed)
CSN: [id number]     Arrival date & time 10/14/13  1531 History   First MD Initiated Contact with Patient 10/14/13 1716     Chief Complaint  Patient presents with  . Shortness of Breath  . Shaking   (Consider location/radiation/quality/duration/timing/severity/associated sxs/prior Treatment) HPI  This is a 62 year old female with a history of coronary artery disease, diabetes, and hypertension who presents with 5 days of shortness of breath. Patient reports cough that is nonproductive during this time. She endorses shortness of breath but no dyspnea on exertion or orthopnea. She states "I just don't feel well."  Patient was seen by her primary care physician yesterday and diagnosed with bronchitis. She's currently on amoxicillin and steroids. She reports worsening of cough since yesterday. She denies any fevers. Patient also reports several episodes of whole body shaking earlier today. She was aware of these episodes and they spontaneously resolved. She denies any chest pain.  Past Medical History  Diagnosis Date  . Hypertension   . Hypothyroid   . Fibromyalgia   . Hypercholesteremia   . Angina   . GERD (gastroesophageal reflux disease)   . Headache(784.0)   . Depression   . H/O hiatal hernia   . Asthma   . PONV (postoperative nausea and vomiting)   . Heart murmur   . Diabetes mellitus     type 2  . Coronary artery disease     s/p CABG  . Myocardial infarction 11/2011   Past Surgical History  Procedure Laterality Date  . Cholecystectomy    . Coronary artery bypass graft  11/27/2011    Procedure: CORONARY ARTERY BYPASS GRAFTING (CABG);  Surgeon: Delight Ovens, MD;  Location: [facility] OR;  Service: Open Heart Surgery;  Laterality: N/A;  coronary artery bypass graft on pump times two utilizing left internal mammary artery and right saphenous vein harvested endoscopically   . Knee surgery    . Coronary angioplasty with stent placement  05/06/2012   Family History  Problem Relation  Age of Onset  . Heart disease Father 36    died of MI  . Peripheral vascular disease Mother 30    bilaterial amputations   History  Substance Use Topics  . Smoking status: Never Smoker   . Smokeless tobacco: Never Used  . Alcohol Use: No   OB History   Grav Para Term Preterm Abortions TAB SAB Ect Mult Living                 Review of Systems  Constitutional: Negative for fever.  Respiratory: Positive for cough and shortness of breath. Negative for chest tightness.   Cardiovascular: Negative for chest pain.  Gastrointestinal: Negative for nausea, vomiting and abdominal pain.  Genitourinary: Negative for dysuria.  Neurological: Negative for headaches.  All other systems reviewed and are negative.    Allergies  Crestor and Exenatide  Home Medications   Current Outpatient Rx  Name  Route  Sig  Dispense  Refill  . amoxicillin-clavulanate (AUGMENTIN) 875-125 MG per tablet   Oral   Take 1 tablet by mouth 2 (two) times daily.         Marland Kitchen aspirin 81 MG tablet   Oral   Take 81 mg by mouth daily.         . DULoxetine (CYMBALTA) 30 MG capsule   Oral   Take 30 mg by mouth daily.          . DULoxetine (CYMBALTA) 60 MG capsule   Oral   Take  60 mg by mouth daily.         . ergocalciferol (VITAMIN D2) 50000 UNITS capsule   Oral   Take 50,000 Units by mouth once a week. fridays         . insulin glargine (LANTUS) 100 UNIT/ML injection   Subcutaneous   Inject 25 Units into the skin at bedtime.          Marland Kitchen levothyroxine (SYNTHROID, LEVOTHROID) 50 MCG tablet   Oral   Take 50 mcg by mouth daily before breakfast.         . metFORMIN (GLUCOPHAGE) 1000 MG tablet   Oral   Take 1,000 mg by mouth 2 (two) times daily with a meal.         . metoprolol tartrate (LOPRESSOR) 25 MG tablet   Oral   Take 1 tablet (25 mg total) by mouth 2 (two) times daily.         . nitroGLYCERIN (NITROSTAT) 0.4 MG SL tablet   Sublingual   Place 0.4 mg under the tongue every 5  (five) minutes as needed. For chest pain         . omeprazole-sodium bicarbonate (ZEGERID) 40-1100 MG per capsule   Oral   Take 1 capsule by mouth daily before breakfast.         . predniSONE (DELTASONE) 10 MG tablet   Oral   Take 10 mg by mouth See admin instructions. 21 day taper pack 6,5,4,3,2,1         . albuterol (PROVENTIL HFA;VENTOLIN HFA) 108 (90 BASE) MCG/ACT inhaler   Inhalation   Inhale 2 puffs into the lungs every 4 (four) hours as needed for wheezing or shortness of breath.   1 Inhaler   0    BP 137/69  Pulse 101  Temp(Src) 98.7 F (37.1 C) (Oral)  Resp 20  SpO2 98% Physical Exam  Nursing note and vitals reviewed. Constitutional: She is oriented to person, place, and time. She appears well-developed and well-nourished. No distress.  HENT:  Head: Normocephalic and atraumatic.  Mouth/Throat: Oropharynx is clear and moist.  Eyes: Pupils are equal, round, and reactive to light.  Neck: Neck supple.  Cardiovascular: Normal rate, regular rhythm and normal heart sounds.   No murmur heard. Pulmonary/Chest: Effort normal and breath sounds normal. No respiratory distress. She has no wheezes.  Coughing  Abdominal: Soft. Bowel sounds are normal. She exhibits no distension. There is no tenderness. There is no rebound.  Musculoskeletal: She exhibits no edema.  Neurological: She is alert and oriented to person, place, and time.  Skin: Skin is warm and dry.  Psychiatric: She has a normal mood and affect.    ED Course  Procedures (including critical care time) Labs Review Labs Reviewed  CBC - Abnormal; Notable for the following:    Hemoglobin 11.6 (*)    HCT 35.0 (*)    MCV 77.3 (*)    MCH 25.6 (*)    Platelets 87 (*)    All other components within normal limits  BASIC METABOLIC PANEL - Abnormal; Notable for the following:    Glucose, Bld 514 (*)    GFR calc non Af Amer 68 (*)    GFR calc Af Amer 78 (*)    All other components within normal limits  PRO B  NATRIURETIC PEPTIDE - Abnormal; Notable for the following:    Pro B Natriuretic peptide (BNP) 146.7 (*)    All other components within normal limits  GLUCOSE, CAPILLARY - Abnormal; Notable  for the following:    Glucose-Capillary 479 (*)    All other components within normal limits  POCT I-STAT TROPONIN I   Imaging Review Dg Chest 2 View  10/14/2013   CLINICAL DATA:  Shortness of breath and cough  EXAM: CHEST  2 VIEW  COMPARISON:  Chest radiograph 11/30/2012 and 11/25/2011  FINDINGS: There are changes of median sternotomy for CABG. Heart size is stable and appears upper normal. Pulmonary vascularity is normal. The lungs are normally expanded and clear. Negative for pleural effusion or focal airspace disease. Negative for pulmonary edema. Stable small sclerotic focus in the left humeral head likely a benign bone island. No acute osseous abnormality.  IMPRESSION: No active cardiopulmonary disease. Prior median sternotomy for CABG.   Electronically Signed   By: Britta Mccreedy M.D.   On: 10/14/2013 16:39    EKG Interpretation     Ventricular Rate:  101 PR Interval:  150 QRS Duration: 66 QT Interval:  344 QTC Calculation: 446 R Axis:   -16 Text Interpretation:  Sinus tachycardia Cannot rule out Anterior infarct , age undetermined Abnormal ECG            MDM   1. Bronchitis   2.  Hyperglycemia 3.  Thrombocytopenia  This is a 62 year old female who presents with cough and shortness of breath. She is nontoxic-appearing on exam and vital signs are notable for pulse of 101. Patient's physical exam is benign at this time but she is actively coughing. Suspect she has a viral syndrome given cough and shortness of breath. Have low suspicion for pneumonia. Chest x-ray is negative. EKG is unchanged from prior and initial troponin is negative. Patient has a normal white count. I discussed with the patient that I felt she likely had a virus causing her current symptoms. She was informed that  viruses can last from 7-10 days and that she may get worse before she gets better. Patient was given albuterol with some improvement of her shortness of breath and cough.  Patient was noted to be mildly tachycardic and appears somewhat dry. She was given 500 cc of normal saline. She is not orthostatic. She denies any history of blood clots, unilateral swelling, recent hospitalization or any other risk factors for PE. Given her infectious symptoms, I have low suspicion for PE. She is now status post albuterol and continues to have mild tachycardia..  Of note, patient had a blood pressure checked on both upper extremities and developed petechiae in her bilateral hands. Patient is on aspirin and her platelet count is 87. Her baseline is around 100. This likely a result of her antiplatelet therapy and thrombocytopenia. She was told to followup with her primary care physician given her thrombocytopenia.  Patient was also noted to be hyperglycemic. She does not have an anion gap. This is likely secondary to prednisone.  I instructed the patient to continue to monitor her blood glucoses at home.  After history, exam, and medical workup I feel the patient has been appropriately medically screened and is safe for discharge home. Pertinent diagnoses were discussed with the patient. Patient was given return precautions.     Shon Baton, MD 10/14/13 [phone]

## 2013-10-14 NOTE — ED Notes (Signed)
Family at bedside. 

## 2013-10-15 ENCOUNTER — Encounter: Payer: Self-pay | Admitting: Cardiology

## 2013-10-15 ENCOUNTER — Ambulatory Visit (INDEPENDENT_AMBULATORY_CARE_PROVIDER_SITE_OTHER): Payer: BC Managed Care – PPO | Admitting: Cardiology

## 2013-10-15 VITALS — BP 144/68 | HR 98 | Ht 62.0 in | Wt 182.4 lb

## 2013-10-15 DIAGNOSIS — R251 Tremor, unspecified: Secondary | ICD-10-CM

## 2013-10-15 DIAGNOSIS — I2510 Atherosclerotic heart disease of native coronary artery without angina pectoris: Secondary | ICD-10-CM

## 2013-10-15 DIAGNOSIS — R259 Unspecified abnormal involuntary movements: Secondary | ICD-10-CM

## 2013-10-15 NOTE — Patient Instructions (Signed)
Your physician wants you to follow-up in: 6 months with Dr. Skains You will receive a reminder letter in the mail two months in advance. If you don't receive a letter, please call our office to schedule the follow-up appointment.  Your physician recommends that you continue on your current medications as directed. Please refer to the Current Medication list given to you today.  

## 2013-10-15 NOTE — Progress Notes (Signed)
[address] [place], [place]  [address] Phone: [phone] Fax:  [phone]  Date:  10/15/2013   ID:  Denise Cuevas, DOB Sep 16, 1951, MRN [id number]  PCP:  Cain Saupe, MD   History of Present Illness: Denise Cuevas is a 62 y.o. female with coronary artery disease status post bypass surgery who yesterday went to the emergency room after having an episode of shaking from head to chest. She had an episode similar to this in the setting of her prior myocardial infarction. She did not think was her heart but she was asked strongly to go to the emergency room for further evaluation. Her EKG in the emergency room, personally viewed, showed no ST segment changes. Troponin was normal. Excellent.  In the ER when utilize the blood pressure cuff on bilateral arms caused minor petechiae in the thumb region.  Today she feels well. Her cough is improved. She does feel some insomnia when taking prednisone.   Wt Readings from Last 3 Encounters:  10/15/13 182 lb 6.4 oz (82.736 kg)  12/01/12 171 lb (77.565 kg)  05/07/12 155 lb 10.3 oz (70.6 kg)     Past Medical History  Diagnosis Date  . Hypertension   . Hypothyroid   . Fibromyalgia   . Hypercholesteremia   . Angina   . GERD (gastroesophageal reflux disease)   . Headache(784.0)   . Depression   . H/O hiatal hernia   . Asthma   . PONV (postoperative nausea and vomiting)   . Heart murmur   . Diabetes mellitus     type 2  . Coronary artery disease     s/p CABG  . Myocardial infarction 11/2011    Past Surgical History  Procedure Laterality Date  . Cholecystectomy    . Coronary artery bypass graft  11/27/2011    Procedure: CORONARY ARTERY BYPASS GRAFTING (CABG);  Surgeon: Delight Ovens, MD;  Location: [facility] OR;  Service: Open Heart Surgery;  Laterality: N/A;  coronary artery bypass graft on pump times two utilizing left internal mammary artery and right saphenous vein harvested endoscopically   . Knee surgery     . Coronary angioplasty with stent placement  05/06/2012    Current Outpatient Prescriptions  Medication Sig Dispense Refill  . albuterol (PROVENTIL HFA;VENTOLIN HFA) 108 (90 BASE) MCG/ACT inhaler Inhale 2 puffs into the lungs every 4 (four) hours as needed for wheezing or shortness of breath.  1 Inhaler  0  . amoxicillin-clavulanate (AUGMENTIN) 875-125 MG per tablet Take 1 tablet by mouth 2 (two) times daily.      Marland Kitchen aspirin 81 MG tablet Take 81 mg by mouth daily.      . DULoxetine (CYMBALTA) 30 MG capsule Take 30 mg by mouth daily.       . DULoxetine (CYMBALTA) 60 MG capsule Take 60 mg by mouth daily.      . ergocalciferol (VITAMIN D2) 50000 UNITS capsule Take 50,000 Units by mouth once a week. fridays      . insulin glargine (LANTUS) 100 UNIT/ML injection Inject 100 Units into the skin at bedtime.       Marland Kitchen levothyroxine (SYNTHROID, LEVOTHROID) 50 MCG tablet Take 50 mcg by mouth daily before breakfast.      . metFORMIN (GLUCOPHAGE) 1000 MG tablet Take 1,000 mg by mouth 2 (two) times daily with a meal.      . metoprolol tartrate (LOPRESSOR) 25 MG tablet Take 1 tablet (25 mg total) by mouth  2 (two) times daily.      . nitroGLYCERIN (NITROSTAT) 0.4 MG SL tablet Place 0.4 mg under the tongue every 5 (five) minutes as needed. For chest pain      . omeprazole-sodium bicarbonate (ZEGERID) 40-1100 MG per capsule Take 1 capsule by mouth daily before breakfast.      . predniSONE (DELTASONE) 10 MG tablet Take 10 mg by mouth See admin instructions. 21 day taper pack 6,5,4,3,2,1       No current facility-administered medications for this visit.    Allergies:    Allergies  Allergen Reactions  . Crestor [Rosuvastatin Calcium] Nausea And Vomiting  . Exenatide Nausea And Vomiting    Social History:  The patient  reports that she has never smoked. She has never used smokeless tobacco. She reports that she does not drink alcohol or use illicit drugs.   ROS:  Please see the history of present illness.    No chest pain, no current shortness of breath, no fevers, no chills.    PHYSICAL EXAM: VS:  BP 144/68  Pulse 98  Ht 5\' 2"  (1.575 m)  Wt 182 lb 6.4 oz (82.736 kg)  BMI 33.35 kg/m2 Well nourished, well developed, in no acute distress HEENT: normal Neck: no JVD Cardiac:  normal S1, S2; RRR; no murmurScar noted. Lungs:  clear to auscultation bilaterally, no wheezing, rhonchi or rales Abd: soft, nontender, no hepatomegaly Ext: no edemaMinor petechiae THUMB region Skin: warm and dry Neuro: no focal abnormalities noted  EKG:  Reviewed from emergency room 10/14/13-no ST segment changes     ASSESSMENT AND PLAN:  1. Coronary artery disease-no current anginal symptoms. Reassuring workup in the ER. No further cardiac workup necessary. I'm unsure what to make of her shakiness. Perhaps stress hormone/anxiety surrounding certain situations. I do not think that it is cardiac. Reassurance. 2. Obesity-encourage weight loss. 3. We will see her back in 6 months.  Signed, Donato Schultz, MD [facility]  10/15/2013 12:17 PM

## 2013-10-29 ENCOUNTER — Other Ambulatory Visit: Payer: Self-pay

## 2013-10-29 DIAGNOSIS — Z1231 Encounter for screening mammogram for malignant neoplasm of breast: Secondary | ICD-10-CM

## 2013-12-07 ENCOUNTER — Ambulatory Visit
Admission: RE | Admit: 2013-12-07 | Discharge: 2013-12-07 | Disposition: A | Discharge status: Home / Self Care | Payer: BC Managed Care – PPO | Source: Ambulatory Visit

## 2013-12-07 DIAGNOSIS — Z1231 Encounter for screening mammogram for malignant neoplasm of breast: Secondary | ICD-10-CM

## 2014-07-25 ENCOUNTER — Other Ambulatory Visit (HOSPITAL_COMMUNITY): Payer: Self-pay | Admitting: Gastroenterology

## 2014-07-25 DIAGNOSIS — R945 Abnormal results of liver function studies: Principal | ICD-10-CM

## 2014-07-25 DIAGNOSIS — R7989 Other specified abnormal findings of blood chemistry: Secondary | ICD-10-CM

## 2014-08-01 ENCOUNTER — Ambulatory Visit (HOSPITAL_COMMUNITY)
Admission: RE | Admit: 2014-08-01 | Discharge: 2014-08-01 | Disposition: A | Discharge status: Home / Self Care | Payer: BC Managed Care – PPO | Source: Ambulatory Visit | Attending: Gastroenterology | Admitting: Gastroenterology

## 2014-08-01 DIAGNOSIS — R7989 Other specified abnormal findings of blood chemistry: Secondary | ICD-10-CM | POA: Insufficient documentation

## 2014-08-01 DIAGNOSIS — R161 Splenomegaly, not elsewhere classified: Secondary | ICD-10-CM | POA: Insufficient documentation

## 2014-08-01 DIAGNOSIS — R945 Abnormal results of liver function studies: Secondary | ICD-10-CM

## 2014-08-09 ENCOUNTER — Encounter (HOSPITAL_COMMUNITY): Payer: Self-pay | Admitting: Pharmacy Technician

## 2014-08-09 DIAGNOSIS — K7460 Unspecified cirrhosis of liver: Secondary | ICD-10-CM

## 2014-08-09 HISTORY — DX: Unspecified cirrhosis of liver: K74.60

## 2014-08-12 ENCOUNTER — Encounter (HOSPITAL_COMMUNITY): Payer: Self-pay | Admitting: *Deleted

## 2014-08-19 ENCOUNTER — Other Ambulatory Visit: Payer: Self-pay | Admitting: Gastroenterology

## 2014-08-22 NOTE — Addendum Note (Signed)
Addended by: Arta Silence on: 08/22/2014 05:58 AM   Modules accepted: Orders

## 2014-08-24 ENCOUNTER — Ambulatory Visit (HOSPITAL_COMMUNITY): Payer: BC Managed Care – PPO | Admitting: Anesthesiology

## 2014-08-24 ENCOUNTER — Encounter (HOSPITAL_COMMUNITY): Payer: BC Managed Care – PPO | Admitting: Anesthesiology

## 2014-08-24 ENCOUNTER — Ambulatory Visit (HOSPITAL_COMMUNITY)
Admission: RE | Admit: 2014-08-24 | Discharge: 2014-08-24 | Disposition: A | Discharge status: Home / Self Care | Payer: BC Managed Care – PPO | Source: Ambulatory Visit | Attending: Gastroenterology | Admitting: Gastroenterology

## 2014-08-24 ENCOUNTER — Encounter (HOSPITAL_COMMUNITY)
Admission: RE | Disposition: A | Discharge status: Home / Self Care | Payer: Self-pay | Source: Ambulatory Visit | Attending: Gastroenterology

## 2014-08-24 ENCOUNTER — Encounter (HOSPITAL_COMMUNITY): Payer: Self-pay

## 2014-08-24 DIAGNOSIS — K2270 Barrett's esophagus without dysplasia: Secondary | ICD-10-CM | POA: Insufficient documentation

## 2014-08-24 DIAGNOSIS — I10 Essential (primary) hypertension: Secondary | ICD-10-CM | POA: Diagnosis not present

## 2014-08-24 DIAGNOSIS — K921 Melena: Secondary | ICD-10-CM | POA: Insufficient documentation

## 2014-08-24 DIAGNOSIS — K648 Other hemorrhoids: Secondary | ICD-10-CM | POA: Diagnosis not present

## 2014-08-24 DIAGNOSIS — K449 Diaphragmatic hernia without obstruction or gangrene: Secondary | ICD-10-CM | POA: Insufficient documentation

## 2014-08-24 DIAGNOSIS — K7460 Unspecified cirrhosis of liver: Secondary | ICD-10-CM | POA: Diagnosis not present

## 2014-08-24 DIAGNOSIS — E119 Type 2 diabetes mellitus without complications: Secondary | ICD-10-CM | POA: Insufficient documentation

## 2014-08-24 DIAGNOSIS — K644 Residual hemorrhoidal skin tags: Secondary | ICD-10-CM | POA: Diagnosis not present

## 2014-08-24 DIAGNOSIS — E669 Obesity, unspecified: Secondary | ICD-10-CM | POA: Diagnosis not present

## 2014-08-24 DIAGNOSIS — Z83719 Family history of colon polyps, unspecified: Secondary | ICD-10-CM | POA: Insufficient documentation

## 2014-08-24 DIAGNOSIS — I8500 Esophageal varices without bleeding: Secondary | ICD-10-CM | POA: Diagnosis not present

## 2014-08-24 DIAGNOSIS — D126 Benign neoplasm of colon, unspecified: Secondary | ICD-10-CM | POA: Diagnosis not present

## 2014-08-24 DIAGNOSIS — Z8371 Family history of colonic polyps: Secondary | ICD-10-CM | POA: Insufficient documentation

## 2014-08-24 HISTORY — DX: Localized edema: R60.0

## 2014-08-24 HISTORY — DX: Unspecified cirrhosis of liver: K74.60

## 2014-08-24 HISTORY — PX: ESOPHAGOGASTRODUODENOSCOPY (EGD) WITH PROPOFOL: SHX5813

## 2014-08-24 HISTORY — PX: COLONOSCOPY WITH PROPOFOL: SHX5780

## 2014-08-24 LAB — GLUCOSE, CAPILLARY: Glucose-Capillary: 182 mg/dL — ABNORMAL HIGH (ref 70–99)

## 2014-08-24 SURGERY — ESOPHAGOGASTRODUODENOSCOPY (EGD) WITH PROPOFOL
Anesthesia: Monitor Anesthesia Care

## 2014-08-24 MED ORDER — PROPOFOL 10 MG/ML IV BOLUS
INTRAVENOUS | Status: DC | PRN
  Administered 2014-08-24: 25 mg via INTRAVENOUS
  Administered 2014-08-24: 50 mg via INTRAVENOUS
  Administered 2014-08-24: 25 mg via INTRAVENOUS
  Administered 2014-08-24: 50 mg via INTRAVENOUS
  Administered 2014-08-24: 25 mg via INTRAVENOUS
  Administered 2014-08-24: 50 mg via INTRAVENOUS
  Administered 2014-08-24 (×3): 25 mg via INTRAVENOUS
  Administered 2014-08-24: 50 mg via INTRAVENOUS

## 2014-08-24 MED ORDER — LACTATED RINGERS IV SOLN
INTRAVENOUS | Status: DC | PRN
  Administered 2014-08-24: 10:00:00 via INTRAVENOUS

## 2014-08-24 MED ORDER — LACTATED RINGERS IV SOLN
INTRAVENOUS | Status: DC
  Administered 2014-08-24: 10:00:00 via INTRAVENOUS

## 2014-08-24 MED ORDER — SODIUM CHLORIDE 0.9 % IV SOLN: INTRAVENOUS | Status: DC

## 2014-08-24 MED ORDER — PROPOFOL 10 MG/ML IV BOLUS
INTRAVENOUS | Status: AC
  Filled 2014-08-24: qty 20

## 2014-08-24 MED ORDER — BUTAMBEN-TETRACAINE-BENZOCAINE 2-2-14 % EX AERO
INHALATION_SPRAY | CUTANEOUS | Status: DC | PRN
  Administered 2014-08-24: 2 via TOPICAL

## 2014-08-24 SURGICAL SUPPLY — 24 items

## 2014-08-24 NOTE — Discharge Instructions (Signed)
Endoscopy Care After Please read the instructions outlined below and refer to this sheet in the next few weeks. These discharge instructions provide you with general information on caring for yourself after you leave the hospital. Your doctor may also give you specific instructions. While your treatment has been planned according to the most current medical practices available, unavoidable complications occasionally occur. If you have any problems or questions after discharge, please call Dr. Raygen Linquist ([facility]) at [phone].  HOME CARE INSTRUCTIONS Activity You may resume your regular activity but move at a slower pace for the next 24 hours.  Take frequent rest periods for the next 24 hours.  Walking will help expel (get rid of) the air and reduce the bloated feeling in your abdomen.  No driving for 24 hours (because of the anesthesia (medicine) used during the test).  You may shower.  Do not sign any important legal documents or operate any machinery for 24 hours (because of the anesthesia used during the test).  Nutrition Drink plenty of fluids.  You may resume your normal diet.  Begin with a light meal and progress to your normal diet.  Avoid alcoholic beverages for 24 hours or as instructed by your caregiver.  Medications You may resume your normal medications unless your caregiver tells you otherwise. What you can expect today You may experience abdominal discomfort such as a feeling of fullness or "gas" pains.  You may experience a sore throat for 2 to 3 days. This is normal. Gargling with salt water may help this.   SEEK IMMEDIATE MEDICAL CARE IF: You have excessive nausea (feeling sick to your stomach) and/or vomiting.  You have severe abdominal pain and distention (swelling).  You have trouble swallowing.  You have a temperature over 100 F (37.8 C).  You have rectal bleeding or vomiting of blood.  Document Released: 07/09/2004 Document Revised: 08/07/2011  Document Reviewed: 01/20/2008 ExitCare Patient Information 2012 [employer]   Colonoscopy  Post procedure instructions:  Read the instructions outlined below and refer to this sheet in the next few weeks. These discharge instructions provide you with general information on caring for yourself after you leave the hospital. Your doctor may also give you specific instructions. While your treatment has been planned according to the most current medical practices available, unavoidable complications occasionally occur. If you have any problems or questions after discharge, call Dr. Bracen Schum at [facility] ([phone]).  HOME CARE INSTRUCTIONS  ACTIVITY: You may resume your regular activity, but move at a slower pace for the next 24 hours.  Take frequent rest periods for the next 24 hours.  Walking will help get rid of the air and reduce the bloated feeling in your belly (abdomen).  No driving for 24 hours (because of the medicine (anesthesia) used during the test).  You may shower.  Do not sign any important legal documents or operate any machinery for 24 hours (because of the anesthesia used during the test).  NUTRITION: Drink plenty of fluids.  You may resume your normal diet as instructed by your doctor.  Begin with a light meal and progress to your normal diet. Heavy or fried foods are harder to digest and may make you feel sick to your stomach (nauseated).  Avoid alcoholic beverages for 24 hours or as instructed.  MEDICATIONS: You may resume your normal medications unless your doctor tells you otherwise.  WHAT TO EXPECT TODAY: Some feelings of bloating in the abdomen.  Passage of more gas than usual.  Spotting   of blood in your stool or on the toilet paper.  IF YOU HAD POLYPS REMOVED DURING THE COLONOSCOPY: No aspirin products for 7 days or as instructed.  No alcohol for 7 days or as instructed.  Eat a soft diet for the next 24 hours.   FINDING OUT THE RESULTS OF YOUR  TEST  Not all test results are available during your visit. If your test results are not back during the visit, make an appointment with your caregiver to find out the results. Do not assume everything is normal if you have not heard from your caregiver or the medical facility. It is important for you to follow up on all of your test results.     SEEK IMMEDIATE MEDICAL CARE IF:  You have more than a spotting of blood in your stool.  Your belly is swollen (abdominal distention).  You are nauseated or vomiting.  You have a fever.  You have abdominal pain or discomfort that is severe or gets worse throughout the day.    Document Released: 07/09/2004 Document Revised: 08/07/2011 Document Reviewed: 07/07/2008 Exit[employer] Patient Information 2012 [employer].  

## 2014-08-24 NOTE — Anesthesia Preprocedure Evaluation (Signed)
Anesthesia Evaluation    Airway Mallampati: II TM Distance: >3 FB Neck ROM: Full    Dental no notable dental hx.    Pulmonary  breath sounds clear to auscultation  Pulmonary exam normal       Cardiovascular hypertension, Rhythm:Regular Rate:Normal + Systolic murmurs    Neuro/Psych    GI/Hepatic   Endo/Other  diabetes  Renal/GU      Musculoskeletal   Abdominal (+) + obese,   Peds  Hematology   Anesthesia Other Findings   Reproductive/Obstetrics                           Anesthesia Physical Anesthesia Plan  ASA: III  Anesthesia Plan: MAC   Post-op Pain Management:    Induction: Intravenous  Airway Management Planned:   Additional Equipment:   Intra-op Plan:   Post-operative Plan:   Informed Consent: I have reviewed the patients History and Physical, chart, labs and discussed the procedure including the risks, benefits and alternatives for the proposed anesthesia with the patient or authorized representative who has indicated his/her understanding and acceptance.   Dental advisory given  Plan Discussed with: CRNA  Anesthesia Plan Comments:         Anesthesia Quick Evaluation

## 2014-08-24 NOTE — H&P (Signed)
Patient interval history reviewed.  Patient examined again.  There has been no change from documented H/P dated 07/25/14 (scanned into chart from our office) except as documented above.  Assessment:  1.  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 2.  Blood in stool. 3.  Family history of colon polyps (mother).  Plan:  1.  Endoscopy for variceal screening. 2.  Risks (bleeding, infection, bowel perforation that could require surgery, sedation-related changes in cardiopulmonary systems), benefits (identification and possible treatment of source of symptoms, exclusion of certain causes of symptoms), and alternatives (watchful waiting, radiographic imaging studies, empiric medical treatment) of upper endoscopy (EGD) were explained to patient/family in detail and patient wishes to proceed. 3.  Colonoscopy to assess for blood in stool. 4.  Risks (bleeding, infection, bowel perforation that could require surgery, sedation-related changes in cardiopulmonary systems), benefits (identification and possible treatment of source of symptoms, exclusion of certain causes of symptoms), and alternatives (watchful waiting, radiographic imaging studies, empiric medical treatment) of colonoscopy were explained to patient/family in detail and patient wishes to proceed.

## 2014-08-24 NOTE — Anesthesia Postprocedure Evaluation (Signed)
  Anesthesia Post-op Note  Patient: Denise Cuevas  Procedure(s) Performed: Procedure(s) (LRB): ESOPHAGOGASTRODUODENOSCOPY (EGD) WITH PROPOFOL (N/A) COLONOSCOPY WITH PROPOFOL (N/A)  Patient Location: PACU  Anesthesia Type: MAC  Level of Consciousness: awake and alert   Airway and Oxygen Therapy: Patient Spontanous Breathing  Post-op Pain: mild  Post-op Assessment: Post-op Vital signs reviewed, Patient's Cardiovascular Status Stable, Respiratory Function Stable, Patent Airway and No signs of Nausea or vomiting  Last Vitals:  Filed Vitals:   08/24/14 1150  BP: 117/42  Pulse: 71  Temp:   Resp: 21    Post-op Vital Signs: stable   Complications: No apparent anesthesia complications

## 2014-08-24 NOTE — Op Note (Signed)
[facility] [place], [address]   ENDOSCOPY PROCEDURE REPORT  PATIENT: Denise Cuevas, Denise Cuevas  MR#: [id number] BIRTHDATE: 1951-08-31 , 62  yrs. old GENDER: Female ENDOSCOPIST: Arta Silence, MD REFERRED BY:  Cammie Fulp PROCEDURE DATE:  08/24/2014 PROCEDURE:  EGD, screening ASA CLASS:     Class III INDICATIONS:  cirrhosis, variceal screening. MEDICATIONS: MAC sedation, administered by CRNA TOPICAL ANESTHETIC: Cetacaine Spray  DESCRIPTION OF PROCEDURE: After the risks benefits and alternatives of the procedure were thoroughly explained, informed consent was obtained.  The Pentax Gastroscope [id number] endoscope was introduced through the mouth and advanced to the second portion of the duodenum. Without limitations.  The instrument was slowly withdrawn as the mucosa was fully examined.    Findings:  Upper esophageal sphincter is at 15cm from the incisors. Long-segment Barrett's esophagus, extending from 18 to 31 cm from the incisors.  No obvious mucosal ulceration or mass seen in this region.   In midst of Barrett's segment, there were a couple lengthy serpiginous submucosal indentations, most consistent with small (Gr I/II) varices, which flattened with insufflation.  Given presence of these varices, I did not biposy her presumed long-segment Barrett's segment.   GE junction 31cm.  Hiatal hernia 31 to 37 cm from incisors.   Stomach with antral nodularity, suspect nodular gastritis, but can't rule out antral varices. Retroflexion into cardia again demonstrated large hiatal hernia, but no evidence of gastric varices.  Normal pylorus.  Normal duodenum to the second portion.            The scope was then withdrawn from the patient and the procedure completed.  ENDOSCOPIC IMPRESSION:     As above.  Large hiatal hernia Small esophageal varices.  No gastric varices.  Long-segment Barrett's mucosa, encompassing nearly her entire esophagus, not biopsied  for reasons as mentioned above.  RECOMMENDATIONS:     1.  Watch for potential complications of procedure. 2.  Consider small uptitration of her beta blocker, would need to get cardiology's input. 3.  Repeat endoscopy in one year, both for variceal surveillance and for Barrett's surveillance.  Will also discuss option of ablative therapies with tertiary center, but doubt they would be able to do this given patient's known (albeit small) esophageal varices. 4.  Proceed with colonoscopy today.  eSigned:  Arta Silence, MD 08/24/2014 11:17 AM   CC:

## 2014-08-24 NOTE — Op Note (Signed)
[facility] [place], [address]   COLONOSCOPY PROCEDURE REPORT  PATIENT: Denise Cuevas, Denise Cuevas  MR#: [id number] BIRTHDATE: 09-23-1951 , 62  yrs. old GENDER: Female ENDOSCOPIST: Arta Silence, MD REFERRED LY:YTKPTW Fulp PROCEDURE DATE:  08/24/2014 PROCEDURE:   Colonoscopy with cold biopsy polypectomy ASA CLASS:   Class III INDICATIONS:hematochezia, family history colon polyps (mother). MEDICATIONS: MAC sedation, administered by CRNA  DESCRIPTION OF PROCEDURE:   After the risks benefits and alternatives of the procedure were thoroughly explained, informed consent was obtained.  A digital rectal exam revealed external hemorrhoids.   The Pentax Ped Colon [id number]  endoscope was introduced through the anus and advanced to the cecum, which was identified by both the appendix and ileocecal valve. No adverse events experienced.   The quality of the prep was good.  The instrument was then slowly withdrawn as the colon was fully examined.   Findings:  External hemorrhoids, otherwise normal digital rectal exam.  Prep quality was good. 74mm and 41mm transverse colon polyps, removed with cold biopsy forceps.  No diverticula evident.  No other polyps, masses, vascular ectasias, or inflammatory changes were seen. Retroflexed views revealed internal hemorrhoids, otherwise normal retroflexed view of rectum.  Withdrawal time was 15 minutes.  The scope was withdrawn and the procedure completed.  ENDOSCOPIC IMPRESSION:     As above.  Hemorrhoids likely source of patient's intermittent and longstanding hematochezia.  RECOMMENDATIONS:     1.  Watch for potential complications of procedure. 2.  Await polypectomy results. 3.  Repeat colonoscopy in 5 years. 4.  Follow-up with [facility] in 3 months.  eSigned:  Arta Silence, MD 08/24/2014 11:33 AM   cc:

## 2014-08-24 NOTE — Transfer of Care (Signed)
Immediate Anesthesia Transfer of Care Note  Patient: Denise Cuevas  Procedure(s) Performed: Procedure(s): ESOPHAGOGASTRODUODENOSCOPY (EGD) WITH PROPOFOL (N/A) COLONOSCOPY WITH PROPOFOL (N/A)  Patient Location: PACU  Anesthesia Type:MAC  Level of Consciousness: sedated  Airway & Oxygen Therapy: Patient Spontanous Breathing and Patient connected to nasal cannula oxygen  Post-op Assessment: Report given to PACU RN and Post -op Vital signs reviewed and stable  Post vital signs: Reviewed and stable  Complications: No apparent anesthesia complications

## 2014-08-25 ENCOUNTER — Encounter (HOSPITAL_COMMUNITY): Payer: Self-pay | Admitting: Gastroenterology

## 2014-10-13 ENCOUNTER — Other Ambulatory Visit (HOSPITAL_COMMUNITY)
Admission: RE | Admit: 2014-10-13 | Discharge: 2014-10-13 | Disposition: A | Discharge status: Home / Self Care | Payer: BC Managed Care – PPO | Source: Ambulatory Visit | Attending: Family Medicine | Admitting: Family Medicine

## 2014-10-13 ENCOUNTER — Other Ambulatory Visit: Payer: Self-pay | Admitting: Family Medicine

## 2014-10-13 DIAGNOSIS — Z0000 Encounter for general adult medical examination without abnormal findings: Secondary | ICD-10-CM | POA: Insufficient documentation

## 2014-10-17 LAB — CYTOLOGY - PAP

## 2014-11-16 ENCOUNTER — Encounter (HOSPITAL_COMMUNITY): Payer: Self-pay | Admitting: Cardiology

## 2014-11-17 ENCOUNTER — Encounter (HOSPITAL_COMMUNITY): Payer: Self-pay | Admitting: Interventional Cardiology

## 2015-02-01 ENCOUNTER — Ambulatory Visit
Admission: RE | Admit: 2015-02-01 | Discharge: 2015-02-01 | Disposition: A | Discharge status: Home / Self Care | Payer: BC Managed Care – PPO | Source: Ambulatory Visit | Attending: Family Medicine | Admitting: Family Medicine

## 2015-02-01 ENCOUNTER — Encounter (INDEPENDENT_AMBULATORY_CARE_PROVIDER_SITE_OTHER): Payer: Self-pay

## 2015-02-01 ENCOUNTER — Other Ambulatory Visit: Payer: Self-pay | Admitting: Family Medicine

## 2015-02-01 DIAGNOSIS — R059 Cough, unspecified: Secondary | ICD-10-CM

## 2015-02-01 DIAGNOSIS — R079 Chest pain, unspecified: Secondary | ICD-10-CM

## 2015-02-01 DIAGNOSIS — R05 Cough: Secondary | ICD-10-CM

## 2015-02-01 DIAGNOSIS — R0602 Shortness of breath: Secondary | ICD-10-CM

## 2015-02-02 ENCOUNTER — Encounter (HOSPITAL_COMMUNITY): Payer: Self-pay

## 2015-02-02 ENCOUNTER — Emergency Department (HOSPITAL_COMMUNITY): Payer: BC Managed Care – PPO

## 2015-02-02 ENCOUNTER — Inpatient Hospital Stay (HOSPITAL_COMMUNITY)
Admission: EM | Admit: 2015-02-02 | Discharge: 2015-02-04 | DRG: 202 | Disposition: A | Discharge status: Home / Self Care | Payer: BC Managed Care – PPO | Attending: Internal Medicine | Admitting: Internal Medicine

## 2015-02-02 DIAGNOSIS — E785 Hyperlipidemia, unspecified: Secondary | ICD-10-CM | POA: Diagnosis present

## 2015-02-02 DIAGNOSIS — B349 Viral infection, unspecified: Secondary | ICD-10-CM | POA: Diagnosis present

## 2015-02-02 DIAGNOSIS — Z951 Presence of aortocoronary bypass graft: Secondary | ICD-10-CM

## 2015-02-02 DIAGNOSIS — I252 Old myocardial infarction: Secondary | ICD-10-CM

## 2015-02-02 DIAGNOSIS — K7460 Unspecified cirrhosis of liver: Secondary | ICD-10-CM | POA: Diagnosis present

## 2015-02-02 DIAGNOSIS — Z888 Allergy status to other drugs, medicaments and biological substances status: Secondary | ICD-10-CM

## 2015-02-02 DIAGNOSIS — J069 Acute upper respiratory infection, unspecified: Secondary | ICD-10-CM | POA: Diagnosis present

## 2015-02-02 DIAGNOSIS — I2510 Atherosclerotic heart disease of native coronary artery without angina pectoris: Secondary | ICD-10-CM | POA: Diagnosis present

## 2015-02-02 DIAGNOSIS — R0603 Acute respiratory distress: Secondary | ICD-10-CM | POA: Diagnosis present

## 2015-02-02 DIAGNOSIS — J9601 Acute respiratory failure with hypoxia: Secondary | ICD-10-CM | POA: Diagnosis present

## 2015-02-02 DIAGNOSIS — J45901 Unspecified asthma with (acute) exacerbation: Principal | ICD-10-CM | POA: Diagnosis present

## 2015-02-02 DIAGNOSIS — F329 Major depressive disorder, single episode, unspecified: Secondary | ICD-10-CM | POA: Diagnosis present

## 2015-02-02 DIAGNOSIS — R0602 Shortness of breath: Secondary | ICD-10-CM

## 2015-02-02 DIAGNOSIS — K2270 Barrett's esophagus without dysplasia: Secondary | ICD-10-CM | POA: Diagnosis present

## 2015-02-02 DIAGNOSIS — R0600 Dyspnea, unspecified: Secondary | ICD-10-CM | POA: Diagnosis not present

## 2015-02-02 DIAGNOSIS — E780 Pure hypercholesterolemia: Secondary | ICD-10-CM | POA: Diagnosis present

## 2015-02-02 DIAGNOSIS — K219 Gastro-esophageal reflux disease without esophagitis: Secondary | ICD-10-CM | POA: Diagnosis present

## 2015-02-02 DIAGNOSIS — F32A Depression, unspecified: Secondary | ICD-10-CM | POA: Diagnosis present

## 2015-02-02 DIAGNOSIS — Z9049 Acquired absence of other specified parts of digestive tract: Secondary | ICD-10-CM | POA: Diagnosis present

## 2015-02-02 DIAGNOSIS — E1165 Type 2 diabetes mellitus with hyperglycemia: Secondary | ICD-10-CM | POA: Diagnosis present

## 2015-02-02 DIAGNOSIS — Z955 Presence of coronary angioplasty implant and graft: Secondary | ICD-10-CM | POA: Diagnosis not present

## 2015-02-02 DIAGNOSIS — I10 Essential (primary) hypertension: Secondary | ICD-10-CM | POA: Diagnosis present

## 2015-02-02 DIAGNOSIS — D638 Anemia in other chronic diseases classified elsewhere: Secondary | ICD-10-CM | POA: Diagnosis present

## 2015-02-02 DIAGNOSIS — D696 Thrombocytopenia, unspecified: Secondary | ICD-10-CM | POA: Diagnosis present

## 2015-02-02 DIAGNOSIS — E039 Hypothyroidism, unspecified: Secondary | ICD-10-CM | POA: Diagnosis present

## 2015-02-02 DIAGNOSIS — Z7982 Long term (current) use of aspirin: Secondary | ICD-10-CM | POA: Diagnosis not present

## 2015-02-02 DIAGNOSIS — R011 Cardiac murmur, unspecified: Secondary | ICD-10-CM

## 2015-02-02 DIAGNOSIS — Z794 Long term (current) use of insulin: Secondary | ICD-10-CM | POA: Diagnosis not present

## 2015-02-02 DIAGNOSIS — D509 Iron deficiency anemia, unspecified: Secondary | ICD-10-CM | POA: Diagnosis present

## 2015-02-02 LAB — I-STAT VENOUS BLOOD GAS, ED
Acid-base deficit: 5 mmol/L — ABNORMAL HIGH (ref 0.0–2.0)
BICARBONATE: 18.8 meq/L — AB (ref 20.0–24.0)
O2 Saturation: 88 %
PH VEN: 7.393 — AB (ref 7.250–7.300)
TCO2: 20 mmol/L (ref 0–100)
pCO2, Ven: 30.8 mmHg — ABNORMAL LOW (ref 45.0–50.0)
pO2, Ven: 55 mmHg — ABNORMAL HIGH (ref 30.0–45.0)

## 2015-02-02 LAB — BASIC METABOLIC PANEL
ANION GAP: 13 (ref 5–15)
BUN: 12 mg/dL (ref 6–23)
CHLORIDE: 105 mmol/L (ref 96–112)
CO2: 18 mmol/L — ABNORMAL LOW (ref 19–32)
CREATININE: 1 mg/dL (ref 0.50–1.10)
Calcium: 8.9 mg/dL (ref 8.4–10.5)
GFR calc Af Amer: 69 mL/min — ABNORMAL LOW (ref >90)
GFR calc non Af Amer: 60 mL/min — ABNORMAL LOW (ref >90)
Glucose, Bld: 531 mg/dL — ABNORMAL HIGH (ref 70–99)
POTASSIUM: 3.9 mmol/L (ref 3.5–5.1)
Sodium: 136 mmol/L (ref 135–145)

## 2015-02-02 LAB — TSH: TSH: 0.833 u[IU]/mL (ref 0.350–4.500)

## 2015-02-02 LAB — GLUCOSE, CAPILLARY: Glucose-Capillary: 271 mg/dL — ABNORMAL HIGH (ref 70–99)

## 2015-02-02 LAB — URINE MICROSCOPIC-ADD ON

## 2015-02-02 LAB — BRAIN NATRIURETIC PEPTIDE: B NATRIURETIC PEPTIDE 5: 256.8 pg/mL — AB (ref 0.0–100.0)

## 2015-02-02 LAB — D-DIMER, QUANTITATIVE: D-Dimer, Quant: 0.51 ug/mL-FEU — ABNORMAL HIGH (ref 0.00–0.48)

## 2015-02-02 LAB — CBC
HCT: 32.1 % — ABNORMAL LOW (ref 36.0–46.0)
Hemoglobin: 9.4 g/dL — ABNORMAL LOW (ref 12.0–15.0)
MCH: 21 pg — ABNORMAL LOW (ref 26.0–34.0)
MCHC: 29.3 g/dL — ABNORMAL LOW (ref 30.0–36.0)
MCV: 71.7 fL — ABNORMAL LOW (ref 78.0–100.0)
PLATELETS: 71 10*3/uL — AB (ref 150–400)
RBC: 4.48 MIL/uL (ref 3.87–5.11)
RDW: 18.8 % — AB (ref 11.5–15.5)
WBC: 5.3 10*3/uL (ref 4.0–10.5)

## 2015-02-02 LAB — TROPONIN I

## 2015-02-02 LAB — I-STAT TROPONIN, ED: TROPONIN I, POC: 0.01 ng/mL (ref 0.00–0.08)

## 2015-02-02 LAB — URINALYSIS, ROUTINE W REFLEX MICROSCOPIC
Bilirubin Urine: NEGATIVE
HGB URINE DIPSTICK: NEGATIVE
KETONES UR: NEGATIVE mg/dL
Leukocytes, UA: NEGATIVE
Nitrite: NEGATIVE
PROTEIN: NEGATIVE mg/dL
Specific Gravity, Urine: 1.042 — ABNORMAL HIGH (ref 1.005–1.030)
Urobilinogen, UA: 1 mg/dL (ref 0.0–1.0)
pH: 5.5 (ref 5.0–8.0)

## 2015-02-02 LAB — RETICULOCYTES
RBC.: 4 MIL/uL (ref 3.87–5.11)
Retic Count, Absolute: 104 10*3/uL (ref 19.0–186.0)
Retic Ct Pct: 2.6 % (ref 0.4–3.1)

## 2015-02-02 LAB — CBG MONITORING, ED: GLUCOSE-CAPILLARY: 293 mg/dL — AB (ref 70–99)

## 2015-02-02 MED ORDER — LEVOTHYROXINE SODIUM 50 MCG PO TABS
50.0000 ug | ORAL_TABLET | Freq: Every day | ORAL | Status: DC
  Administered 2015-02-03 – 2015-02-04 (×2): 50 ug via ORAL
  Filled 2015-02-02 (×3): qty 1

## 2015-02-02 MED ORDER — DULOXETINE HCL 60 MG PO CPEP
60.0000 mg | ORAL_CAPSULE | Freq: Every day | ORAL | Status: DC
  Administered 2015-02-03 – 2015-02-04 (×2): 60 mg via ORAL
  Filled 2015-02-02 (×2): qty 1

## 2015-02-02 MED ORDER — SODIUM CHLORIDE 0.9 % IJ SOLN
3.0000 mL | Freq: Two times a day (BID) | INTRAMUSCULAR | Status: DC
  Administered 2015-02-03 (×2): 3 mL via INTRAVENOUS

## 2015-02-02 MED ORDER — SODIUM CHLORIDE 0.9 % IJ SOLN
3.0000 mL | Freq: Two times a day (BID) | INTRAMUSCULAR | Status: DC
  Administered 2015-02-03: 3 mL via INTRAVENOUS

## 2015-02-02 MED ORDER — INSULIN ASPART 100 UNIT/ML ~~LOC~~ SOLN
0.0000 [IU] | Freq: Three times a day (TID) | SUBCUTANEOUS | Status: DC
  Administered 2015-02-03: 5 [IU] via SUBCUTANEOUS
  Administered 2015-02-03: 9 [IU] via SUBCUTANEOUS
  Administered 2015-02-03: 2 [IU] via SUBCUTANEOUS
  Administered 2015-02-04: 9 [IU] via SUBCUTANEOUS
  Administered 2015-02-04: 2 [IU] via SUBCUTANEOUS

## 2015-02-02 MED ORDER — ATORVASTATIN CALCIUM 40 MG PO TABS
40.0000 mg | ORAL_TABLET | Freq: Every day | ORAL | Status: DC
  Administered 2015-02-03 – 2015-02-04 (×2): 40 mg via ORAL
  Filled 2015-02-02 (×3): qty 1

## 2015-02-02 MED ORDER — GUAIFENESIN ER 600 MG PO TB12
600.0000 mg | ORAL_TABLET | Freq: Two times a day (BID) | ORAL | Status: DC
  Administered 2015-02-02 – 2015-02-04 (×4): 600 mg via ORAL
  Filled 2015-02-02 (×5): qty 1

## 2015-02-02 MED ORDER — SODIUM CHLORIDE 0.9 % IV BOLUS (SEPSIS)
1000.0000 mL | Freq: Once | INTRAVENOUS | Status: AC
  Administered 2015-02-02: 1000 mL via INTRAVENOUS

## 2015-02-02 MED ORDER — SODIUM CHLORIDE 0.9 % IV SOLN: 250.0000 mL | INTRAVENOUS | Status: DC | PRN

## 2015-02-02 MED ORDER — ONDANSETRON HCL 4 MG/2ML IJ SOLN: 4.0000 mg | Freq: Four times a day (QID) | INTRAMUSCULAR | Status: DC | PRN

## 2015-02-02 MED ORDER — ALBUTEROL SULFATE (2.5 MG/3ML) 0.083% IN NEBU: 2.5000 mg | INHALATION_SOLUTION | RESPIRATORY_TRACT | Status: DC | PRN

## 2015-02-02 MED ORDER — METOPROLOL TARTRATE 12.5 MG HALF TABLET
12.5000 mg | ORAL_TABLET | Freq: Two times a day (BID) | ORAL | Status: DC
  Administered 2015-02-02 – 2015-02-04 (×4): 12.5 mg via ORAL
  Filled 2015-02-02 (×5): qty 1

## 2015-02-02 MED ORDER — ASPIRIN 81 MG PO CHEW
81.0000 mg | CHEWABLE_TABLET | Freq: Every day | ORAL | Status: DC
  Administered 2015-02-02 – 2015-02-04 (×3): 81 mg via ORAL
  Filled 2015-02-02 (×4): qty 1

## 2015-02-02 MED ORDER — LEVOFLOXACIN IN D5W 500 MG/100ML IV SOLN
500.0000 mg | INTRAVENOUS | Status: DC
  Administered 2015-02-02 – 2015-02-03 (×2): 500 mg via INTRAVENOUS
  Filled 2015-02-02 (×3): qty 100

## 2015-02-02 MED ORDER — ONDANSETRON HCL 4 MG PO TABS: 4.0000 mg | ORAL_TABLET | Freq: Four times a day (QID) | ORAL | Status: DC | PRN

## 2015-02-02 MED ORDER — PANTOPRAZOLE SODIUM 40 MG PO TBEC
40.0000 mg | DELAYED_RELEASE_TABLET | Freq: Every day | ORAL | Status: DC
  Administered 2015-02-03 – 2015-02-04 (×2): 40 mg via ORAL
  Filled 2015-02-02 (×2): qty 1

## 2015-02-02 MED ORDER — ALBUTEROL SULFATE (2.5 MG/3ML) 0.083% IN NEBU
2.5000 mg | INHALATION_SOLUTION | Freq: Four times a day (QID) | RESPIRATORY_TRACT | Status: DC
  Administered 2015-02-02 – 2015-02-04 (×7): 2.5 mg via RESPIRATORY_TRACT
  Filled 2015-02-02 (×8): qty 3

## 2015-02-02 MED ORDER — DULOXETINE HCL 30 MG PO CPEP
30.0000 mg | ORAL_CAPSULE | Freq: Every day | ORAL | Status: DC
  Administered 2015-02-03 – 2015-02-04 (×2): 30 mg via ORAL
  Filled 2015-02-02 (×2): qty 1

## 2015-02-02 MED ORDER — LISINOPRIL 5 MG PO TABS
5.0000 mg | ORAL_TABLET | Freq: Every day | ORAL | Status: DC
  Administered 2015-02-03: 5 mg via ORAL
  Filled 2015-02-02 (×2): qty 1

## 2015-02-02 MED ORDER — METHYLPREDNISOLONE SODIUM SUCC 40 MG IJ SOLR: 40.0000 mg | Freq: Two times a day (BID) | INTRAMUSCULAR | Status: DC

## 2015-02-02 MED ORDER — GUAIFENESIN-DM 100-10 MG/5ML PO SYRP
5.0000 mL | ORAL_SOLUTION | ORAL | Status: DC | PRN
  Filled 2015-02-02 (×2): qty 5

## 2015-02-02 MED ORDER — METHYLPREDNISOLONE SODIUM SUCC 125 MG IJ SOLR
40.0000 mg | Freq: Two times a day (BID) | INTRAMUSCULAR | Status: DC
  Filled 2015-02-02 (×2): qty 0.64

## 2015-02-02 MED ORDER — METHYLPREDNISOLONE SODIUM SUCC 40 MG IJ SOLR
40.0000 mg | Freq: Two times a day (BID) | INTRAMUSCULAR | Status: DC
  Administered 2015-02-02: 40 mg via INTRAVENOUS
  Filled 2015-02-02 (×2): qty 1

## 2015-02-02 MED ORDER — SODIUM CHLORIDE 0.9 % IJ SOLN: 3.0000 mL | INTRAMUSCULAR | Status: DC | PRN

## 2015-02-02 MED ORDER — ALBUTEROL SULFATE HFA 108 (90 BASE) MCG/ACT IN AERS
4.0000 | INHALATION_SPRAY | Freq: Once | RESPIRATORY_TRACT | Status: AC
  Administered 2015-02-02: 4 via RESPIRATORY_TRACT
  Filled 2015-02-02: qty 6.7

## 2015-02-02 MED ORDER — METHYLPREDNISOLONE SODIUM SUCC 40 MG IJ SOLR
40.0000 mg | Freq: Two times a day (BID) | INTRAMUSCULAR | Status: DC
  Administered 2015-02-03 – 2015-02-04 (×3): 40 mg via INTRAVENOUS
  Filled 2015-02-02 (×3): qty 0.64
  Filled 2015-02-02 (×2): qty 1

## 2015-02-02 MED ORDER — INSULIN ASPART PROT & ASPART (70-30 MIX) 100 UNIT/ML ~~LOC~~ SUSP
60.0000 [IU] | Freq: Two times a day (BID) | SUBCUTANEOUS | Status: DC
Start: 2015-02-03 — End: 2015-02-03
  Administered 2015-02-03: 60 [IU] via SUBCUTANEOUS
  Filled 2015-02-02: qty 10

## 2015-02-02 MED ORDER — PREDNISONE 20 MG PO TABS
20.0000 mg | ORAL_TABLET | Freq: Every day | ORAL | Status: DC
  Administered 2015-02-03: 20 mg via ORAL
  Filled 2015-02-02 (×2): qty 1

## 2015-02-02 NOTE — H&P (Addendum)
[facility] History and Physical  Denise Cuevas MWU:[id number] DOB: Nov 27, 1951 DOA: 02/02/2015  Referring physician: ER physician PCP: Antony Blackbird, MD   Chief Complaint: dyspnea   HPI:  Pt is 64 yo female with HTN, HLD, DM, CAD with MI in 2012 and s/p CABG, presented to several days duration of progressive dyspnea that initially started with exertion and now present at rest, associated with non productive cough, wheezing, malaise, poor oral intake, subjective fevers, anterior chest pain that is mostly present with coughing spells. Pt explained that chest pain is also worse with deep breathing and with no specific alleviating factors. Chest tightness is currently not present but when present earlier, was 5/10 in severity and non radiating, no specific alleviating factors. Pt denies abdominal or urinary concerns. Pt reports she was seen by PCP and was started on Levaquin and prednisone but her symptoms got worse.   In ED, pt noted to be hemodynamically stable, VS notable for RR in mid 20's, O2 sat in mid 90's on 2 L [place]. TRH asked to admit for further management of presumptive asthma exacerbation.   Assessment & Plan    Principal Problem:   Acute respiratory disease - appears to be secondary to acute on chronic asthma exacerbation with ? Acute bronchitis  - admit to telemetry bed due to chest discomfort - place on bronchodilators scheduled and as needed - keep on oxygen via  for now - start Solumedrol 40 mg IV BID and taper down as clinically indicated  - since pt had fevers yesterday, will place on empiric ABX Levaquin and will need to follow up on sputum cx, urine legionella and strep pneumo   Active Problems:   Chest pain - possibly related to coughing spells in the setting acute asthma exacerbation - monitor on telemetry given extensive cardiac history and MI in 2012 s/p CABG  - cycle CE's    Anemia of chronic disease, microcytic, ? IDA - Hg in the past year was ~11 -  will ask for FOBT, anemia panel - place on SCD's and hold on all heparin products for now  - please note that pt had recent EGD by Dr. Paulita Fujita 08/2014 notable for segment of Barrett's esophagus which will need close follow up and repeat EGD in 1 year, colonoscopy 08/2014 with tubular adenoma and no signs of malignancy  - repeat CBC in AM    Thrombocytopenia  - chronic in nature and per record review ~ 26 - 100K in the past year - will hold all heparin products for now - no signs of active bleeding - repeat CBC in AM    CAD (coronary artery disease) - continue aspirin     Hypothyroidism - check TSH and continue home medical regimen     Depression - stable at this time, continue duloxetine    DM type II - continue home medical regimen with insulin 70/30 but will hold Janumet - place on SSI for now as well  - check A1C    GERD (gastroesophageal reflux disease) - continue PPI    HTN - continue metoprolol and lisinopril     HLD - continue statin   DVT prophylaxis: SCD's  Radiological Exams on Admission: Dg Chest 2 View  02/02/2015 No active cardiopulmonary disease.     Dg Chest 2 View  02/01/2015  Borderline cardiomegaly.  Status post CABG.  No active disease.    EKG: pending   Code Status: Full Family Communication: Plan of care discussed with the patient  Disposition Plan: Admit for further evaluation  Leisa Lenz, MD  [facility] Pager [phone]  Review of Systems:  Constitutional: Negative for fever, chills. Negative for diaphoresis.  HENT: Negative for hearing loss, ear pain, nosebleeds, congestion, sore throat, neck pain, tinnitus and ear discharge.   Eyes: Negative for blurred vision, double vision, photophobia, pain, discharge and redness.  Respiratory: Negative for hemoptysis and stridor.   Cardiovascular: Negative for orthopnea, claudication and leg swelling.  Gastrointestinal: Negative for nausea, vomiting and abdominal pain.  Genitourinary: Negative  for dysuria, urgency, frequency, hematuria and flank pain.  Musculoskeletal: Negative for myalgias, back pain, joint pain and falls.  Skin: Negative for itching and rash.  Neurological: Negative for dizziness and weakness.  Endo/Heme/Allergies: Negative for environmental allergies and polydipsia. Does not bruise/bleed easily.  Psychiatric/Behavioral: Negative for suicidal ideas. The patient is not nervous/anxious.      Past Medical History  Diagnosis Date  . Hypertension   . Hypothyroid   . Fibromyalgia   . Hypercholesteremia   . Angina   . GERD (gastroesophageal reflux disease)   . Depression   . H/O hiatal hernia   . Asthma   . Heart murmur   . Diabetes mellitus     type 2  . Headache(784.0)     occ. generalized  . Cirrhosis of liver     08-12-14 one week ago" told has cirrhosis"  . Edema extremities     consistent edema lower legs, feet, and right quadrant abdominal pain-"goes and comes"  . Coronary artery disease     s/p CABG  . Myocardial infarction 11/2011    MI x2- 3'55,7'32 coronary stent(chest pain episode at time)  . PONV (postoperative nausea and vomiting)    Past Surgical History  Procedure Laterality Date  . Knee surgery Right     scope surgery  . Coronary angioplasty with stent placement  05/06/2012  . Cholecystectomy      open many yrs ago  . Coronary artery bypass graft  11/27/2011    Procedure: CORONARY ARTERY BYPASS GRAFTING (CABG);  Surgeon: [name], MD;  Location: [facility];  Service: Open Heart Surgery;  Laterality: N/A;  coronary artery bypass graft on pump times two utilizing left internal mammary artery and right saphenous vein harvested endoscopically   . Esophagogastroduodenoscopy (egd) with propofol N/A 08/24/2014    Procedure: ESOPHAGOGASTRODUODENOSCOPY (EGD) WITH PROPOFOL;  Surgeon: Arta Silence, MD;  Location: [facility];  Service: Endoscopy;  Laterality: N/A;  . Colonoscopy with propofol N/A 08/24/2014    Procedure: COLONOSCOPY WITH  PROPOFOL;  Surgeon: Arta Silence, MD;  Location: [facility];  Service: Endoscopy;  Laterality: N/A;  . Left heart catheterization with coronary angiogram N/A 11/19/2011    Procedure: LEFT HEART CATHETERIZATION WITH CORONARY ANGIOGRAM;  Surgeon: Candee Furbish, MD;  Location: [facility] CATH LAB;  Service: Cardiovascular;  Laterality: N/A;  . Left heart catheterization with coronary/graft angiogram N/A 03/24/2012    Procedure: LEFT HEART CATHETERIZATION WITH Beatrix Fetters;  Surgeon: Jettie Booze, MD;  Location: [facility] CATH LAB;  Service: Cardiovascular;  Laterality: N/A;  . Percutaneous coronary stent intervention (pci-s)  03/24/2012    Procedure: PERCUTANEOUS CORONARY STENT INTERVENTION (PCI-S);  Surgeon: Jettie Booze, MD;  Location: [facility] CATH LAB;  Service: Cardiovascular;;  . Percutaneous coronary stent intervention (pci-s) Bilateral 05/06/2012    Procedure: PERCUTANEOUS CORONARY STENT INTERVENTION (PCI-S);  Surgeon: Jettie Booze, MD;  Location: [facility] CATH LAB;  Service: Cardiovascular;  Laterality: Bilateral;  . Left heart catheterization with coronary angiogram  05/06/2012    Procedure: LEFT HEART CATHETERIZATION WITH CORONARY ANGIOGRAM;  Surgeon: Jettie Booze, MD;  Location: [facility] CATH LAB;  Service: Cardiovascular;;   Social History:  reports that she has never smoked. She has never used smokeless tobacco. She reports that she does not drink alcohol or use illicit drugs.  Allergies  Allergen Reactions  . Crestor [Rosuvastatin Calcium] Nausea And Vomiting  . Exenatide Nausea And Vomiting    Family History:  Family History  Problem Relation Age of Onset  . Heart disease Father 59    died of MI  . Peripheral vascular disease Mother 3    bilaterial amputations     Prior to Admission medications   Medication Sig Start Date End Date Taking? Authorizing Provider  aspirin 81 MG tablet Take 81 mg by mouth daily.   Yes Historical Provider, MD  atorvastatin (LIPITOR) 40  MG tablet Take 40 mg by mouth daily.   Yes Historical Provider, MD  DULoxetine (CYMBALTA) 30 MG capsule Take 30 mg by mouth daily with lunch.    Yes Historical Provider, MD  DULoxetine (CYMBALTA) 60 MG capsule Take 60 mg by mouth daily with lunch.    Yes Historical Provider, MD  insulin aspart protamine- aspart (NOVOLOG MIX 70/30) (70-30) 100 UNIT/ML injection Inject 60 Units into the skin 2 (two) times daily with a meal.   Yes Historical Provider, MD  levofloxacin (LEVAQUIN) 500 MG tablet Take 500 mg by mouth daily. Daily for 10 days.  Started on 02-01-15   Yes Historical Provider, MD  levothyroxine (SYNTHROID, LEVOTHROID) 50 MCG tablet Take 50 mcg by mouth daily before breakfast.   Yes Historical Provider, MD  lisinopril (PRINIVIL,ZESTRIL) 5 MG tablet Take 5 mg by mouth daily with lunch.    Yes Historical Provider, MD  metoprolol tartrate (LOPRESSOR) 25 MG tablet Take 12.5 mg by mouth 2 (two) times daily.    Yes Historical Provider, MD  omeprazole-sodium bicarbonate (ZEGERID) 40-1100 MG per capsule Take 1 capsule by mouth daily before breakfast.   Yes Historical Provider, MD  predniSONE (DELTASONE) 20 MG tablet Take 20 mg by mouth daily with breakfast. 60 mg for on day 1, 40 mg on day 2, 20 mg for day 3 and 4. 10 mg for 5 more days. Total of 9 day therapy.   Yes Historical Provider, MD  sitaGLIPtin-metformin (JANUMET) 50-1000 MG per tablet Take 1 tablet by mouth 2 (two) times daily with a meal.   Yes Historical Provider, MD  nitroGLYCERIN (NITROSTAT) 0.4 MG SL tablet Place 0.4 mg under the tongue every 5 (five) minutes as needed. For chest pain    Historical Provider, MD   Physical Exam: Filed Vitals:   02/02/15 1516 02/02/15 1615 02/02/15 1630 02/02/15 1632  BP: 125/49 123/36 120/58 120/58  Pulse: 81 86 83 84  Temp:      TempSrc:      Resp: 21 27 17 18   Height:      Weight:      SpO2: 99% 100% 100% 100%    Physical Exam  Constitutional: Appears well-developed and well-nourished. No  distress.  HENT: Normocephalic. No tonsillar erythema or exudates Eyes: Conjunctivae and EOM are normal. PERRLA, no scleral icterus.  Neck: Normal ROM. Neck supple. No JVD. No tracheal deviation. No thyromegaly.  CVS: RRR, S1/S2 +, no murmurs, no gallops, no carotid bruit.  Pulmonary: Effort and breath sounds normal, no stridor, rhonchi, wheezes, rales.  Abdominal: Soft. BS +,  no distension, tenderness, rebound or guarding.  Musculoskeletal: Normal range of motion. No edema and no tenderness.  Lymphadenopathy: No lymphadenopathy noted, cervical, inguinal. Neuro: Alert. Normal reflexes, muscle tone coordination. No focal neurologic deficits. Skin: Skin is warm and dry. No rash noted.  No erythema. No pallor.  Psychiatric: Normal mood and affect. Behavior, judgment, thought content normal.   Labs on Admission:  Basic Metabolic Panel:  Recent Labs Lab 02/02/15 1426  NA 136  K 3.9  CL 105  CO2 18*  GLUCOSE 531*  BUN 12  CREATININE 1.00  CALCIUM 8.9   CBC:  Recent Labs Lab 02/02/15 1426  WBC 5.3  HGB 9.4*  HCT 32.1*  MCV 71.7*  PLT 71*   CBG:  Recent Labs Lab 02/02/15 1720  GLUCAP 293*    If 7PM-7AM, please contact night-coverage [email or web address] Password [facility] 02/02/2015, 5:27 PM

## 2015-02-02 NOTE — ED Provider Notes (Signed)
CSN: [id number]     Arrival date & time 02/02/15  1417 History   First MD Initiated Contact with Patient 02/02/15 1456     No chief complaint on file.  (Consider location/radiation/quality/duration/timing/severity/associated sxs/prior Treatment) Patient is a 64 y.o. female presenting with shortness of breath. The history is provided by the patient. No language interpreter was used.  Shortness of Breath Severity:  Moderate Onset quality:  Gradual Duration:  3 days Timing:  Constant Chronicity:  Recurrent Context: URI   Context: not activity, not known allergens, not occupational exposure and not smoke exposure   Relieved by:  Nothing Worsened by:  Deep breathing and coughing Ineffective treatments: Levaquin, Prednisone. Associated symptoms: chest pain, cough, fever, sore throat and wheezing   Associated symptoms: no hemoptysis, no rash, no sputum production and no vomiting   Chest pain:    Quality:  Sharp   Severity:  Moderate   Onset quality:  Sudden   Duration:  3 days   Timing:  Intermittent   Progression:  Unchanged   Chronicity:  Recurrent Cough:    Cough characteristics:  Non-productive   Severity:  Moderate   Onset quality:  Gradual   Duration:  3 days   Timing:  Intermittent   Progression:  Unchanged   Chronicity:  New Fever:    Duration:  3 days   Timing:  Sporadic   Max temp PTA (F):  100   Past Medical History  Diagnosis Date  . Hypertension   . Hypothyroid   . Fibromyalgia   . Hypercholesteremia   . Angina   . GERD (gastroesophageal reflux disease)   . Depression   . H/O hiatal hernia   . Asthma   . Heart murmur   . Diabetes mellitus     type 2  . Headache(784.0)     occ. generalized  . Cirrhosis of liver     08-12-14 one week ago" told has cirrhosis"  . Edema extremities     consistent edema lower legs, feet, and right quadrant abdominal pain-"goes and comes"  . Coronary artery disease     s/p CABG  . Myocardial infarction 11/2011    MI x2-  8'34,1'96 coronary stent(chest pain episode at time)  . PONV (postoperative nausea and vomiting)    Past Surgical History  Procedure Laterality Date  . Knee surgery Right     scope surgery  . Coronary angioplasty with stent placement  05/06/2012  . Cholecystectomy      open many yrs ago  . Coronary artery bypass graft  11/27/2011    Procedure: CORONARY ARTERY BYPASS GRAFTING (CABG);  Surgeon: [name], MD;  Location: [facility];  Service: Open Heart Surgery;  Laterality: N/A;  coronary artery bypass graft on pump times two utilizing left internal mammary artery and right saphenous vein harvested endoscopically   . Esophagogastroduodenoscopy (egd) with propofol N/A 08/24/2014    Procedure: ESOPHAGOGASTRODUODENOSCOPY (EGD) WITH PROPOFOL;  Surgeon: Arta Silence, MD;  Location: [facility];  Service: Endoscopy;  Laterality: N/A;  . Colonoscopy with propofol N/A 08/24/2014    Procedure: COLONOSCOPY WITH PROPOFOL;  Surgeon: Arta Silence, MD;  Location: [facility];  Service: Endoscopy;  Laterality: N/A;  . Left heart catheterization with coronary angiogram N/A 11/19/2011    Procedure: LEFT HEART CATHETERIZATION WITH CORONARY ANGIOGRAM;  Surgeon: Candee Furbish, MD;  Location: [facility] CATH LAB;  Service: Cardiovascular;  Laterality: N/A;  . Left heart catheterization with coronary/graft angiogram N/A 03/24/2012    Procedure: LEFT HEART CATHETERIZATION WITH  Beatrix Fetters;  Surgeon: Jettie Booze, MD;  Location: [facility] CATH LAB;  Service: Cardiovascular;  Laterality: N/A;  . Percutaneous coronary stent intervention (pci-s)  03/24/2012    Procedure: PERCUTANEOUS CORONARY STENT INTERVENTION (PCI-S);  Surgeon: Jettie Booze, MD;  Location: [facility] CATH LAB;  Service: Cardiovascular;;  . Percutaneous coronary stent intervention (pci-s) Bilateral 05/06/2012    Procedure: PERCUTANEOUS CORONARY STENT INTERVENTION (PCI-S);  Surgeon: Jettie Booze, MD;  Location: [facility] CATH LAB;  Service:  Cardiovascular;  Laterality: Bilateral;  . Left heart catheterization with coronary angiogram  05/06/2012    Procedure: LEFT HEART CATHETERIZATION WITH CORONARY ANGIOGRAM;  Surgeon: Jettie Booze, MD;  Location: [facility] CATH LAB;  Service: Cardiovascular;;   Family History  Problem Relation Age of Onset  . Heart disease Father 69    died of MI  . Peripheral vascular disease Mother 67    bilaterial amputations   History  Substance Use Topics  . Smoking status: Never Smoker   . Smokeless tobacco: Never Used  . Alcohol Use: No   OB History    No data available     Review of Systems  Constitutional: Positive for fever.  HENT: Positive for sore throat.   Respiratory: Positive for cough, shortness of breath and wheezing. Negative for hemoptysis and sputum production.   Cardiovascular: Positive for chest pain.  Gastrointestinal: Negative for vomiting.  Skin: Negative for rash.      Allergies  Crestor and Exenatide  Home Medications   Prior to Admission medications   Medication Sig Start Date End Date Taking? Authorizing Provider  aspirin 81 MG tablet Take 81 mg by mouth daily.    Historical Provider, MD  atorvastatin (LIPITOR) 40 MG tablet Take 40 mg by mouth daily.    Historical Provider, MD  DULoxetine (CYMBALTA) 30 MG capsule Take 30 mg by mouth daily with lunch.     Historical Provider, MD  DULoxetine (CYMBALTA) 60 MG capsule Take 60 mg by mouth daily with lunch.     Historical Provider, MD  insulin aspart protamine- aspart (NOVOLOG MIX 70/30) (70-30) 100 UNIT/ML injection Inject 60 Units into the skin 2 (two) times daily with a meal.    Historical Provider, MD  levothyroxine (SYNTHROID, LEVOTHROID) 50 MCG tablet Take 50 mcg by mouth daily before breakfast.    Historical Provider, MD  lisinopril (PRINIVIL,ZESTRIL) 5 MG tablet Take 5 mg by mouth daily with lunch.     Historical Provider, MD  metoprolol tartrate (LOPRESSOR) 25 MG tablet Take 12.5 mg by mouth 2 (two) times  daily.     Historical Provider, MD  nitroGLYCERIN (NITROSTAT) 0.4 MG SL tablet Place 0.4 mg under the tongue every 5 (five) minutes as needed. For chest pain    Historical Provider, MD  omeprazole-sodium bicarbonate (ZEGERID) 40-1100 MG per capsule Take 1 capsule by mouth daily before breakfast.    Historical Provider, MD  sitaGLIPtin-metformin (JANUMET) 50-1000 MG per tablet Take 1 tablet by mouth 2 (two) times daily with a meal.    Historical Provider, MD   BP 151/47 mmHg  Pulse 83  Temp(Src) 98.3 F (36.8 C) (Oral)  Resp 24  Ht 5\' 2"  (1.575 m)  Wt 180 lb (81.647 kg)  BMI 32.91 kg/m2  SpO2 100% Physical Exam  ED Course  Procedures (including critical care time) Labs Review Labs Reviewed  CBC - Abnormal; Notable for the following:    Hemoglobin 9.4 (*)    HCT 32.1 (*)    MCV 71.7 (*)  MCH 21.0 (*)    MCHC 29.3 (*)    RDW 18.8 (*)    Platelets 71 (*)    All other components within normal limits  BASIC METABOLIC PANEL - Abnormal; Notable for the following:    CO2 18 (*)    Glucose, Bld 531 (*)    GFR calc non Af Amer 60 (*)    GFR calc Af Amer 69 (*)    All other components within normal limits  BRAIN NATRIURETIC PEPTIDE - Abnormal; Notable for the following:    B Natriuretic Peptide 256.8 (*)    All other components within normal limits  URINALYSIS, ROUTINE W REFLEX MICROSCOPIC - Abnormal; Notable for the following:    Specific Gravity, Urine 1.042 (*)    Glucose, UA >1000 (*)    All other components within normal limits  D-DIMER, QUANTITATIVE - Abnormal; Notable for the following:    D-Dimer, Quant 0.51 (*)    All other components within normal limits  URINE MICROSCOPIC-ADD ON - Abnormal; Notable for the following:    Bacteria, UA FEW (*)    All other components within normal limits  GLUCOSE, CAPILLARY - Abnormal; Notable for the following:    Glucose-Capillary 271 (*)    All other components within normal limits  I-STAT VENOUS BLOOD GAS, ED - Abnormal; Notable  for the following:    pH, Ven 7.393 (*)    pCO2, Ven 30.8 (*)    pO2, Ven 55.0 (*)    Bicarbonate 18.8 (*)    Acid-base deficit 5.0 (*)    All other components within normal limits  CBG MONITORING, ED - Abnormal; Notable for the following:    Glucose-Capillary 293 (*)    All other components within normal limits  CULTURE, EXPECTORATED SPUTUM-ASSESSMENT  TROPONIN I  TROPONIN I  RETICULOCYTES  TSH  TROPONIN I  VITAMIN B12  FOLATE  IRON AND TIBC  FERRITIN  HEMOGLOBIN G1W  BASIC METABOLIC PANEL  CBC  LEGIONELLA ANTIGEN, URINE  STREP PNEUMONIAE URINARY ANTIGEN  I-STAT TROPOININ, ED    Imaging Review Dg Chest 2 View  02/01/2015   CLINICAL DATA:  Cough, chest congestion for 1 week, question pneumonia  EXAM: CHEST  2 VIEW  COMPARISON:  10/14/2013  FINDINGS: Borderline cardiomegaly. Status post CABG. No acute infiltrate or pulmonary edema. Mild degenerative changes lower thoracic spine.  IMPRESSION: Borderline cardiomegaly.  Status post CABG.  No active disease.   Electronically Signed   By: Lahoma Crocker M.D.   On: 02/01/2015 17:17     EKG Interpretation   Date/Time:  Thursday February 02 2015 14:21:12 EST Ventricular Rate:  83 PR Interval:  144 QRS Duration: 74 QT Interval:  392 QTC Calculation: 460 R Axis:   -7 Text Interpretation:  Normal sinus rhythm ST abnormality, possible  digitalis effect Abnormal ECG No significant change since last tracing  Confirmed by Winfred Leeds  MD, SAM [phone]) on 02/02/2015 3:46:11 PM      MDM   Final diagnoses:  Acute respiratory disease  SOB (shortness of breath)   64 yo F HTN, HLD, CAD s/p CABG, PCI, Cirrhosis, asthma, present with CC SOB, cough, CP X 3 days.  Pt started on Levaquin and Prednisone for her symptoms yesterday by PCP, which have not improved.    Physical exam as above.  Pt with some wheezing, and ongoing CP.  EKG appears no significant changes from prior.  CBC with anemia 9.4, which appears microcytic.  BMP with elevated  glucose 531 with decreased  bicarb, but normal anion gap, and normal pH on blood gas.  Troponin was WNL.  BNP slightly elevated.  UA was negative.  D dimer age-adjusted does not meet criteria for elevation, thus unlikely PE.    Likely viral syndrome with asthma exacerbation, and pt with hyperglycemia 2/2 infection, steroids.  Pt given NS bolus, albuterol with mild improvement.    Medicine consulted for observation admission given multiple problems, and hx of CAD with ongoing CP, although this is likely to be noncardiac in nature.  Pt understands and agrees with plan.   Sinda Du  Discussed pt with attending Dr. Winfred Leeds.    Sinda Du, MD 02/03/15 [id number]  Orlie Dakin, MD 02/04/15 [phone]

## 2015-02-02 NOTE — ED Provider Notes (Signed)
Complains of left-sided chest pain anterior intermittent nonradiating, for several days(pt states several weeks)  accompanied by shortness of breath, for the past 3 days . Patient states that pain does not feel like "either heart attack"    However her previous 2 MIs did  bnot resemble each other. No chest pain now  Orlie Dakin, MD 02/03/15 [phone]

## 2015-02-02 NOTE — Progress Notes (Signed)
RN called ED for report.

## 2015-02-02 NOTE — Progress Notes (Signed)
NURSING PROGRESS NOTE  Denise Cuevas [id number] Admission Data: 02/02/2015 6:40 PM Attending Provider: Theodis Blaze, MD BFX:OVAN, CAMMIE, MD Code Status: full   Denise Cuevas is a 64 y.o. female patient admitted from ED:  -No acute distress noted.  -No complaints of shortness of breath.  -No complaints of chest pain.   Cardiac Monitoring: Box # [id number] in place. Cardiac monitor yields:normal sinus rhythm.  Blood pressure 148/49, pulse 80, temperature 98.8 F (37.1 C), temperature source Oral, resp. rate 17, height 5\' 2"  (1.575 m), weight 81.647 kg (180 lb), SpO2 100 %.   IV Fluids:  IV in place, occlusive dsg intact without redness, IV cath forearm right, condition patent and no redness normal saline lock.   Allergies:  Crestor and Exenatide  Past Medical History:   has a past medical history of Hypertension; Hypothyroid; Fibromyalgia; Hypercholesteremia; Angina; GERD (gastroesophageal reflux disease); Depression; H/O hiatal hernia; Asthma; Heart murmur; Diabetes mellitus; Headache(784.0); Cirrhosis of liver; Edema extremities; Coronary artery disease; Myocardial infarction (11/2011); and PONV (postoperative nausea and vomiting).  Past Surgical History:   has past surgical history that includes Knee surgery (Right); Coronary angioplasty with stent (05/06/2012); Cholecystectomy; Coronary artery bypass graft (11/27/2011); Esophagogastroduodenoscopy (egd) with propofol (N/A, 08/24/2014); Colonoscopy with propofol (N/A, 08/24/2014); left heart catheterization with coronary angiogram (N/A, 11/19/2011); left heart catheterization with coronary/graft angiogram (N/A, 03/24/2012); percutaneous coronary stent intervention (pci-s) (03/24/2012); percutaneous coronary stent intervention (pci-s) (Bilateral, 05/06/2012); and left heart catheterization with coronary angiogram (05/06/2012).  Social History:   reports that she has never smoked. She has never used smokeless tobacco. She reports that she does not  drink alcohol or use illicit drugs.  Skin: dry intact   Patient/Family orientated to room. Information packet given to patient/family. Admission inpatient armband information verified with patient/family to include name and date of birth and placed on patient arm. Side rails up x 2, fall assessment and education completed with patient/family. Patient/family able to verbalize understanding of risk associated with falls and verbalized understanding to call for assistance before getting out of bed. Call light within reach. Patient/family able to voice and demonstrate understanding of unit orientation instructions.    Will continue to evaluate and treat per MD orders.

## 2015-02-02 NOTE — ED Notes (Addendum)
Pt presents with shortness of breath, cough and chest pain x 3-4 days.  Pt reports pain has been constant, was seen at PCP yesterday and sent home.  Pt reports dizziness and shortness of breath without exertion. Pt also reports onset of lower lip, tip of tongue numbness and L thumb, 2nd and 3rd finger numbness that began at 0900 this morning.  Pt reports taking a medication from PCP yesterday but reports symptoms began this morning.

## 2015-02-03 DIAGNOSIS — K210 Gastro-esophageal reflux disease with esophagitis: Secondary | ICD-10-CM

## 2015-02-03 DIAGNOSIS — J4541 Moderate persistent asthma with (acute) exacerbation: Secondary | ICD-10-CM

## 2015-02-03 DIAGNOSIS — R0600 Dyspnea, unspecified: Secondary | ICD-10-CM

## 2015-02-03 DIAGNOSIS — F329 Major depressive disorder, single episode, unspecified: Secondary | ICD-10-CM

## 2015-02-03 DIAGNOSIS — J9601 Acute respiratory failure with hypoxia: Secondary | ICD-10-CM

## 2015-02-03 LAB — VITAMIN B12: Vitamin B-12: 564 pg/mL (ref 211–911)

## 2015-02-03 LAB — BASIC METABOLIC PANEL
Anion gap: 13 (ref 5–15)
BUN: 10 mg/dL (ref 6–23)
CALCIUM: 8.6 mg/dL (ref 8.4–10.5)
CO2: 20 mmol/L (ref 19–32)
CREATININE: 0.87 mg/dL (ref 0.50–1.10)
Chloride: 106 mmol/L (ref 96–112)
GFR calc Af Amer: 82 mL/min — ABNORMAL LOW (ref >90)
GFR, EST NON AFRICAN AMERICAN: 70 mL/min — AB (ref >90)
Glucose, Bld: 257 mg/dL — ABNORMAL HIGH (ref 70–99)
Potassium: 3.6 mmol/L (ref 3.5–5.1)
Sodium: 139 mmol/L (ref 135–145)

## 2015-02-03 LAB — TROPONIN I
Troponin I: 0.03 ng/mL (ref <0.031)
Troponin I: 0.05 ng/mL — ABNORMAL HIGH (ref <0.031)

## 2015-02-03 LAB — CBC
HEMATOCRIT: 28.5 % — AB (ref 36.0–46.0)
Hemoglobin: 8.3 g/dL — ABNORMAL LOW (ref 12.0–15.0)
MCH: 21 pg — ABNORMAL LOW (ref 26.0–34.0)
MCHC: 29.1 g/dL — ABNORMAL LOW (ref 30.0–36.0)
MCV: 72 fL — ABNORMAL LOW (ref 78.0–100.0)
PLATELETS: 78 10*3/uL — AB (ref 150–400)
RBC: 3.96 MIL/uL (ref 3.87–5.11)
RDW: 19.1 % — ABNORMAL HIGH (ref 11.5–15.5)
WBC: 8.4 10*3/uL (ref 4.0–10.5)

## 2015-02-03 LAB — FERRITIN: Ferritin: 12 ng/mL (ref 10–291)

## 2015-02-03 LAB — IRON AND TIBC
Iron: 13 ug/dL — ABNORMAL LOW (ref 42–145)
Saturation Ratios: 3 % — ABNORMAL LOW (ref 20–55)
TIBC: 394 ug/dL (ref 250–470)
UIBC: 381 ug/dL (ref 125–400)

## 2015-02-03 LAB — FOLATE: FOLATE: 14 ng/mL

## 2015-02-03 LAB — GLUCOSE, CAPILLARY
GLUCOSE-CAPILLARY: 348 mg/dL — AB (ref 70–99)
Glucose-Capillary: 192 mg/dL — ABNORMAL HIGH (ref 70–99)
Glucose-Capillary: 277 mg/dL — ABNORMAL HIGH (ref 70–99)
Glucose-Capillary: 321 mg/dL — ABNORMAL HIGH (ref 70–99)
Glucose-Capillary: 372 mg/dL — ABNORMAL HIGH (ref 70–99)

## 2015-02-03 MED ORDER — FERROUS SULFATE 325 (65 FE) MG PO TABS
325.0000 mg | ORAL_TABLET | Freq: Two times a day (BID) | ORAL | Status: DC
  Administered 2015-02-03 – 2015-02-04 (×2): 325 mg via ORAL
  Filled 2015-02-03 (×4): qty 1

## 2015-02-03 MED ORDER — INSULIN ASPART PROT & ASPART (70-30 MIX) 100 UNIT/ML ~~LOC~~ SUSP
70.0000 [IU] | Freq: Two times a day (BID) | SUBCUTANEOUS | Status: DC
  Administered 2015-02-03 – 2015-02-04 (×2): 70 [IU] via SUBCUTANEOUS
  Filled 2015-02-03: qty 10

## 2015-02-03 NOTE — Progress Notes (Signed)
HO Ghimire paged, pt noticed today she has a right breast wound, quarter sized, yellow in color, with skin breakdown, having scant purulent drainage, asked if a wound consult could be ordered. Pt has not pain associated with this wound, has not noticed it before, and believes it has been there since prior to admission.

## 2015-02-03 NOTE — Progress Notes (Signed)
  Echocardiogram 2D Echocardiogram has been performed.  Denise Cuevas 02/03/2015, 5:20 PM

## 2015-02-03 NOTE — Progress Notes (Addendum)
PATIENT DETAILS Name: Denise Cuevas Age: 64 y.o. Sex: female Date of Birth: 09-17-1951 Admit Date: 02/02/2015 Admitting Physician Robbie Lis, MD NTZ:GYFV, CAMMIE, MD  Subjective: Feels much better  Assessment/Plan: Principal Problem:   Acute hypoxic respiratory failure: Suspect secondary to acute asthmatic bronchitis. Significantly improved following IV steroids, empiric antibiotics and scheduled bronchodilators. Taper off oxygen  Active Problems:   Acute asthmatic bronchitis: Improved, continue Solu-Medrol, Levaquin, scheduled bronchodilators. Still some rhonchi, but moving air well bilaterally. Will check echo, Follow.    Chest pain: Atypical-mostly musculoskeletal from cough. Cardiac enzymes negative. Follow echo    Microcytic anemia: Await iron panel.Colonoscopy in September 2015 negative except for hemorrhoids. Patient currently denies any overt GI loss. Further workup will be deferred to the outpatient setting     Thrombocytopenia: Chronic issue. Follow CBC periodically. Further workup deferred to the outpatient setting    Hypothyroidism: Continue levothyroxine    DM: CBGs uncontrolled as on steroids-increase NovoLog 70/30-70 units twice a day.    Hypertension: Continue lisinopril and metoprolol. Follow.    CAD (coronary artery disease): Chest pain atypical, shortness of breath secondary to bronchospasm. Continue with aspirin, statin and beta blocker     Depression: Continue with Cymbalta     GERD (gastroesophageal reflux disease): Continue Protonix   Disposition: Remain inpatient  Antibiotics:  See below   Anti-infectives    Start     Dose/Rate Route Frequency Ordered Stop   02/02/15 1815  levofloxacin (LEVAQUIN) IVPB 500 mg     500 mg 100 mL/hr over 60 Minutes Intravenous Every 24 hours 02/02/15 1814        DVT Prophylaxis:  SCD's  Code Status: Full code   Family Communication None at bedside  Procedures:  None  CONSULTS:   None  Time spent 40 minutes-which includes 50% of the time with face-to-face with patient/ family and coordinating care related to the above assessment and plan.  MEDICATIONS: Scheduled Meds: . albuterol  2.5 mg Nebulization Q6H  . aspirin  81 mg Oral Daily  . atorvastatin  40 mg Oral Daily  . DULoxetine  30 mg Oral Q lunch  . DULoxetine  60 mg Oral Q lunch  . guaiFENesin  600 mg Oral BID  . insulin aspart  0-9 Units Subcutaneous TID WC  . insulin aspart protamine- aspart  60 Units Subcutaneous BID WC  . levofloxacin (LEVAQUIN) IV  500 mg Intravenous Q24H  . levothyroxine  50 mcg Oral QAC breakfast  . lisinopril  5 mg Oral Q lunch  . methylPREDNISolone sodium succinate  40 mg Intravenous Q12H  . metoprolol tartrate  12.5 mg Oral BID  . pantoprazole  40 mg Oral Daily  . predniSONE  20 mg Oral Q breakfast  . sodium chloride  3 mL Intravenous Q12H  . sodium chloride  3 mL Intravenous Q12H   Continuous Infusions:  PRN Meds:.sodium chloride, albuterol, guaiFENesin-dextromethorphan, ondansetron **OR** ondansetron (ZOFRAN) IV, sodium chloride    PHYSICAL EXAM: Vital signs in last 24 hours: Filed Vitals:   02/02/15 2012 02/02/15 2224 02/03/15 0552 02/03/15 0943  BP:  156/50 139/50   Pulse:   84   Temp:  99 F (37.2 C) 98.3 F (36.8 C)   TempSrc:  Oral Oral   Resp:   15   Height:      Weight:      SpO2: 94%  100% 100%    Weight change:  Filed Weights   02/02/15 1422  Weight: 81.647 kg (180 lb)   Body mass index is 32.91 kg/(m^2).   Gen Exam: Awake and alert with clear speech.   Neck: Supple, No JVD.   Chest: B/L Clear.   CVS: S1 S2 Regular, no murmurs.  Abdomen: soft, BS +, non tender, non distended.  Extremities: no edema, lower extremities warm to touch. Neurologic: Non Focal.   Skin: No Rash.   Wounds: N/A.   Intake/Output from previous day:  Intake/Output Summary (Last 24 hours) at 02/03/15 1247 Last data filed at 02/03/15 1027  Gross per 24 hour  Intake     120 ml  Output   1100 ml  Net   -980 ml     LAB RESULTS: CBC  Recent Labs Lab 02/02/15 1426 02/03/15 0600  WBC 5.3 8.4  HGB 9.4* 8.3*  HCT 32.1* 28.5*  PLT 71* 78*  MCV 71.7* 72.0*  MCH 21.0* 21.0*  MCHC 29.3* 29.1*  RDW 18.8* 19.1*    Chemistries   Recent Labs Lab 02/02/15 1426 02/03/15 0600  NA 136 139  K 3.9 3.6  CL 105 106  CO2 18* 20  GLUCOSE 531* 257*  BUN 12 10  CREATININE 1.00 0.87  CALCIUM 8.9 8.6    CBG:  Recent Labs Lab 02/02/15 1720 02/02/15 2224 02/03/15 0801 02/03/15 1135  GLUCAP 293* 271* 192* 372*    GFR Estimated Creatinine Clearance: 67.2 mL/min (by C-G formula based on Cr of 0.87).  Coagulation profile No results for input(s): INR, PROTIME in the last 168 hours.  Cardiac Enzymes  Recent Labs Lab 02/02/15 1739 02/02/15 2326 02/03/15 0600  TROPONINI <0.03 0.03 0.05*    Invalid input(s): POCBNP  Recent Labs  02/02/15 1546  DDIMER 0.51*   No results for input(s): HGBA1C in the last 72 hours. No results for input(s): CHOL, HDL, LDLCALC, TRIG, CHOLHDL, LDLDIRECT in the last 72 hours.  Recent Labs  02/02/15 2000  TSH 0.833    Recent Labs  02/02/15 2000  VITAMINB12 564  FOLATE 14.0  FERRITIN 12  TIBC 394  IRON 13*  RETICCTPCT 2.6   No results for input(s): LIPASE, AMYLASE in the last 72 hours.  Urine Studies No results for input(s): UHGB, CRYS in the last 72 hours.  Invalid input(s): UACOL, UAPR, USPG, UPH, UTP, UGL, UKET, UBIL, UNIT, UROB, ULEU, UEPI, UWBC, URBC, UBAC, CAST, UCOM, BILUA  MICROBIOLOGY: No results found for this or any previous visit (from the past 240 hour(s)).  RADIOLOGY STUDIES/RESULTS: Dg Chest 2 View  02/02/2015   CLINICAL DATA:  Chest pain, shortness of breath, cough  EXAM: CHEST  2 VIEW  COMPARISON:  02/01/2015  FINDINGS: Cardiomediastinal silhouette is stable. Status post CABG. No acute infiltrate or pleural effusion. No pulmonary edema. Bony thorax is stable.  IMPRESSION: No  active cardiopulmonary disease.   Electronically Signed   By: Lahoma Crocker M.D.   On: 02/02/2015 15:12   Dg Chest 2 View  02/01/2015   CLINICAL DATA:  Cough, chest congestion for 1 week, question pneumonia  EXAM: CHEST  2 VIEW  COMPARISON:  10/14/2013  FINDINGS: Borderline cardiomegaly. Status post CABG. No acute infiltrate or pulmonary edema. Mild degenerative changes lower thoracic spine.  IMPRESSION: Borderline cardiomegaly.  Status post CABG.  No active disease.   Electronically Signed   By: Lahoma Crocker M.D.   On: 02/01/2015 17:17    Oren Binet, MD  [facility] Pager:[phone]  If 7PM-7AM, please contact night-coverage [email or web address] Password [facility] 02/03/2015, 12:47 PM   LOS:  1 day

## 2015-02-03 NOTE — Care Management Note (Signed)
Utilization Review completed   Marlia Schewe,RN, BSN,CCM 

## 2015-02-03 NOTE — Progress Notes (Signed)
Results for SHERESE, HEYWARD (MRN [id number]) as of 02/03/2015 13:14  Ref. Range 08/24/2014 10:10 02/02/2015 17:20 02/02/2015 22:24 02/03/2015 08:01 02/03/2015 11:35  Glucose-Capillary Latest Range: 70-99 mg/dL 182 (H) 293 (H) 271 (H) 192 (H) 372 (H)  CBGs continue to be greater than 180 mg/dl.  Continue 70/30 60 units BID. Recommend increasing Novolog to RESISTANT TID if CBGs continue to be elevated and while on steroids.  May need to decrease Novolog when steroids are decreased.  Will continue to follow while in hospital. Harvel Ricks RN BSN CDE

## 2015-02-04 DIAGNOSIS — E039 Hypothyroidism, unspecified: Secondary | ICD-10-CM

## 2015-02-04 DIAGNOSIS — R079 Chest pain, unspecified: Secondary | ICD-10-CM

## 2015-02-04 DIAGNOSIS — J209 Acute bronchitis, unspecified: Secondary | ICD-10-CM

## 2015-02-04 LAB — CBC
HCT: 29.5 % — ABNORMAL LOW (ref 36.0–46.0)
Hemoglobin: 8.6 g/dL — ABNORMAL LOW (ref 12.0–15.0)
MCH: 21 pg — ABNORMAL LOW (ref 26.0–34.0)
MCHC: 29.2 g/dL — ABNORMAL LOW (ref 30.0–36.0)
MCV: 72 fL — ABNORMAL LOW (ref 78.0–100.0)
Platelets: 74 10*3/uL — ABNORMAL LOW (ref 150–400)
RBC: 4.1 MIL/uL (ref 3.87–5.11)
RDW: 19.3 % — AB (ref 11.5–15.5)
WBC: 7.1 10*3/uL (ref 4.0–10.5)

## 2015-02-04 LAB — GLUCOSE, CAPILLARY
Glucose-Capillary: 181 mg/dL — ABNORMAL HIGH (ref 70–99)
Glucose-Capillary: 356 mg/dL — ABNORMAL HIGH (ref 70–99)

## 2015-02-04 LAB — HEMOGLOBIN A1C
Hgb A1c MFr Bld: 7.9 % — ABNORMAL HIGH (ref 4.8–5.6)
Mean Plasma Glucose: 180 mg/dL

## 2015-02-04 MED ORDER — PREDNISONE 20 MG PO TABS
40.0000 mg | ORAL_TABLET | Freq: Every day | ORAL | Status: DC
  Filled 2015-02-04: qty 2

## 2015-02-04 MED ORDER — LEVOFLOXACIN 500 MG PO TABS: 500.0000 mg | ORAL_TABLET | Freq: Every day | ORAL | Status: DC

## 2015-02-04 MED ORDER — ALBUTEROL SULFATE (2.5 MG/3ML) 0.083% IN NEBU: 2.5000 mg | INHALATION_SOLUTION | RESPIRATORY_TRACT | Status: AC | PRN

## 2015-02-04 MED ORDER — GUAIFENESIN ER 600 MG PO TB12: 600.0000 mg | ORAL_TABLET | Freq: Two times a day (BID) | ORAL | Status: DC

## 2015-02-04 MED ORDER — FERROUS SULFATE 325 (65 FE) MG PO TABS: 325.0000 mg | ORAL_TABLET | Freq: Two times a day (BID) | ORAL | Status: DC

## 2015-02-04 MED ORDER — FLUTICASONE-SALMETEROL 250-50 MCG/DOSE IN AEPB: 1.0000 | INHALATION_SPRAY | Freq: Two times a day (BID) | RESPIRATORY_TRACT | Status: DC

## 2015-02-04 MED ORDER — BENZONATATE 200 MG PO CAPS
200.0000 mg | ORAL_CAPSULE | Freq: Three times a day (TID) | ORAL | Status: DC | PRN
Start: 2015-02-04 — End: 2015-04-20

## 2015-02-04 MED ORDER — ALBUTEROL SULFATE HFA 108 (90 BASE) MCG/ACT IN AERS: 2.0000 | INHALATION_SPRAY | Freq: Four times a day (QID) | RESPIRATORY_TRACT | Status: AC | PRN

## 2015-02-04 NOTE — Discharge Summary (Signed)
PATIENT DETAILS Name: Denise Cuevas Age: 64 y.o. Sex: female Date of Birth: [birth year] MRN: [id number]. Admitting Physician: Robbie Lis, MD SEG:BTDV, CAMMIE, MD  Admit Date: 02/02/2015 Discharge date: 02/04/2015  Recommendations for Outpatient Follow-up:  Has Iron deficiency anemia-had EGD/Colonoscopy 2015. Please refer to GI. Started on Iron supplementation Small dry superficial ulceration in the right breast area-further workup deferred to the outpatient setting.  PRIMARY DISCHARGE DIAGNOSIS:  Principal Problem:   Acute respiratory disease Active Problems:   Hypothyroidism   CAD (coronary artery disease)   Depression   GERD (gastroesophageal reflux disease)   SOB (shortness of breath)   Acute respiratory distress      PAST MEDICAL HISTORY: Past Medical History  Diagnosis Date  . Hypertension   . Hypothyroid   . Fibromyalgia   . Hypercholesteremia   . Angina   . GERD (gastroesophageal reflux disease)   . Depression   . H/O hiatal hernia   . Asthma   . Heart murmur   . Diabetes mellitus     type 2  . Headache(784.0)     occ. generalized  . Cirrhosis of liver     08-12-14 one week ago" told has cirrhosis"  . Edema extremities     consistent edema lower legs, feet, and right quadrant abdominal pain-"goes and comes"  . Coronary artery disease     s/p CABG  . Myocardial infarction 11/2011    MI x2- 7'61,6'07 coronary stent(chest pain episode at time)  . PONV (postoperative nausea and vomiting)     DISCHARGE MEDICATIONS: Current Discharge Medication List    START taking these medications   Details  albuterol (PROVENTIL HFA;VENTOLIN HFA) 108 (90 BASE) MCG/ACT inhaler Inhale 2 puffs into the lungs every 6 (six) hours as needed for wheezing or shortness of breath. Qty: 1 Inhaler, Refills: 2    albuterol (PROVENTIL) (2.5 MG/3ML) 0.083% nebulizer solution Take 3 mLs (2.5 mg total) by nebulization every 2 (two) hours as needed for wheezing. Qty: 75 mL,  Refills: 0    benzonatate (TESSALON) 200 MG capsule Take 1 capsule (200 mg total) by mouth 3 (three) times daily as needed for cough. Qty: 20 capsule, Refills: 0    ferrous sulfate 325 (65 FE) MG tablet Take 1 tablet (325 mg total) by mouth 2 (two) times daily with a meal. Qty: 60 tablet, Refills: 0    Fluticasone-Salmeterol (ADVAIR DISKUS) 250-50 MCG/DOSE AEPB Inhale 1 puff into the lungs 2 (two) times daily. Qty: 60 each, Refills: 0    guaiFENesin (MUCINEX) 600 MG 12 hr tablet Take 1 tablet (600 mg total) by mouth 2 (two) times daily. Qty: 7 tablet, Refills: 0      CONTINUE these medications which have CHANGED   Details  levofloxacin (LEVAQUIN) 500 MG tablet Take 1 tablet (500 mg total) by mouth daily. Qty: 4 tablet, Refills: 0      CONTINUE these medications which have NOT CHANGED   Details  aspirin 81 MG tablet Take 81 mg by mouth daily.    atorvastatin (LIPITOR) 40 MG tablet Take 40 mg by mouth daily.    !! DULoxetine (CYMBALTA) 30 MG capsule Take 30 mg by mouth daily with lunch.     !! DULoxetine (CYMBALTA) 60 MG capsule Take 60 mg by mouth daily with lunch.     insulin aspart protamine- aspart (NOVOLOG MIX 70/30) (70-30) 100 UNIT/ML injection Inject 60 Units into the skin 2 (two) times daily with a meal.    levothyroxine (SYNTHROID, LEVOTHROID)  50 MCG tablet Take 50 mcg by mouth daily before breakfast.    lisinopril (PRINIVIL,ZESTRIL) 5 MG tablet Take 5 mg by mouth daily with lunch.     metoprolol tartrate (LOPRESSOR) 25 MG tablet Take 12.5 mg by mouth 2 (two) times daily.     omeprazole-sodium bicarbonate (ZEGERID) 40-1100 MG per capsule Take 1 capsule by mouth daily before breakfast.    predniSONE (DELTASONE) 20 MG tablet Take 20 mg by mouth daily with breakfast. 60 mg for on day 1, 40 mg on day 2, 20 mg for day 3 and 4. 10 mg for 5 more days. Total of 9 day therapy.    sitaGLIPtin-metformin (JANUMET) 50-1000 MG per tablet Take 1 tablet by mouth 2 (two) times daily  with a meal.    nitroGLYCERIN (NITROSTAT) 0.4 MG SL tablet Place 0.4 mg under the tongue every 5 (five) minutes as needed. For chest pain     !! - Potential duplicate medications found. Please discuss with provider.      ALLERGIES:   Allergies  Allergen Reactions  . Crestor [Rosuvastatin Calcium] Nausea And Vomiting  . Exenatide Nausea And Vomiting    BRIEF HPI:  See H&P, Labs, Consult and Test reports for all details in brief, patient is 64 yo female with HTN, HLD, DM, CAD with MI in 2012 and s/p CABG, presented to several days duration of progressive dyspnea. Reportedly seen by PCP and started on Levaquin and prednisone, however symptoms continued to get worse with worsening wheezing and shortness of breath, subsequently presented to the ED and was admitted to the hospitalist service for acute asthmatic bronchitis.  CONSULTATIONS:   None  PERTINENT RADIOLOGIC STUDIES: Dg Chest 2 View  02/02/2015   CLINICAL DATA:  Chest pain, shortness of breath, cough  EXAM: CHEST  2 VIEW  COMPARISON:  02/01/2015  FINDINGS: Cardiomediastinal silhouette is stable. Status post CABG. No acute infiltrate or pleural effusion. No pulmonary edema. Bony thorax is stable.  IMPRESSION: No active cardiopulmonary disease.   Electronically Signed   By: Lahoma Crocker M.D.   On: 02/02/2015 15:12   Dg Chest 2 View  02/01/2015   CLINICAL DATA:  Cough, chest congestion for 1 week, question pneumonia  EXAM: CHEST  2 VIEW  COMPARISON:  10/14/2013  FINDINGS: Borderline cardiomegaly. Status post CABG. No acute infiltrate or pulmonary edema. Mild degenerative changes lower thoracic spine.  IMPRESSION: Borderline cardiomegaly.  Status post CABG.  No active disease.   Electronically Signed   By: Lahoma Crocker M.D.   On: 02/01/2015 17:17     PERTINENT LAB RESULTS: CBC:  Recent Labs  02/03/15 0600 02/04/15 0600  WBC 8.4 7.1  HGB 8.3* 8.6*  HCT 28.5* 29.5*  PLT 78* 74*   CMET CMP     Component Value Date/Time   NA 139  02/03/2015 0600   K 3.6 02/03/2015 0600   CL 106 02/03/2015 0600   CO2 20 02/03/2015 0600   GLUCOSE 257* 02/03/2015 0600   BUN 10 02/03/2015 0600   CREATININE 0.87 02/03/2015 0600   CALCIUM 8.6 02/03/2015 0600   PROT 5.8* 03/25/2012 0002   ALBUMIN 3.0* 03/25/2012 0002   AST 69* 03/25/2012 0002   ALT 43* 03/25/2012 0002   ALKPHOS 144* 03/25/2012 0002   BILITOT 0.7 03/25/2012 0002   GFRNONAA 70* 02/03/2015 0600   GFRAA 82* 02/03/2015 0600    GFR Estimated Creatinine Clearance: 67.2 mL/min (by C-G formula based on Cr of 0.87). No results for input(s): LIPASE, AMYLASE in  the last 72 hours.  Recent Labs  02/02/15 1739 02/02/15 2326 02/03/15 0600  TROPONINI <0.03 0.03 0.05*   Invalid input(s): POCBNP  Recent Labs  02/02/15 1546  DDIMER 0.51*    Recent Labs  02/02/15 2000  HGBA1C 7.9*   No results for input(s): CHOL, HDL, LDLCALC, TRIG, CHOLHDL, LDLDIRECT in the last 72 hours.  Recent Labs  02/02/15 2000  TSH 0.833    Recent Labs  02/02/15 2000  VITAMINB12 564  FOLATE 14.0  FERRITIN 12  TIBC 394  IRON 13*  RETICCTPCT 2.6   Coags: No results for input(s): INR in the last 72 hours.  Invalid input(s): PT Microbiology: No results found for this or any previous visit (from the past 240 hour(s)).   BRIEF HOSPITAL COURSE:  Acute hypoxic respiratory failure: Suspect secondary to acute asthmatic bronchitis. Resolved with IV steroids, empiric antibiotics and scheduled bronchodilators. Tapered off oxygen. Breathing much better, per patient she is almost back to her usual baseline, lungs are essentially clear. She is requesting discharge today, she will be discharged on tapering prednisone, bronchodilators and empiric antibiotics as above.  Active Problems:  Acute asthmatic bronchitis: Admitted and started on Solu-Medrol, empiric Levaquin, scheduled bronchodilators.with these measures, patient rapidly improved. By day of discharge was breathing much better, lungs  are essentially clear without any rhonchi with very good air entry bilaterally. She has ambulated in the room and in the hallway without major difficulty, and claimed to this M.D. that she wanted to go home and was almost at her baseline. She is being discharged at her own request, she will be placed on tapering prednisone, bronchodilators and empiric antibiotics as noted above in the medication list.    Chest pain: Atypical-mostly musculoskeletal from cough. Cardiac enzymes neechocardiogram demonstrated preserved ejection fraction   Microcytic anemia:  patient has developed microcytic anemia over the past few years, iron panel confirms iron deficiency..Colonoscopy in September 2015 negative except for hemorrhoids.  EGD in 2015 showed varices. Patient was supposed to follow up with equal gastroenterology, but was lost to follow-up. Patient denies any recent black stools or hematochezia. I have instructed patient to begin taking iron supplementation. She has been asked to follow-up with gastroenterology for further outpatient workup and monitoring. Patient will need periodic CBC monitoring to make sure that her counts start going up on iron supplementation.  Further workup will be deferred to the outpatient setting    Thrombocytopenia: Chronic issue. Follow CBC periodically at PCPs office . Further workup deferred to the outpatient setting   Hypothyroidism: Continue levothyroxine   DM: CBGs uncontrolled as on steroids- briefly increase NovoLog 70/30-70 units twice a day-however on discharge since we'll be back on oral tapering prednisone, we will continue her usual insulin regimen and oral hypoglycemics will also be resumed.    Hypertension: Continue lisinopril and metoprolol. Follow and optimize as needed as outpatient    CAD (coronary artery disease): Chest pain atypical, shortness of breath secondary to bronchospasm. Continue with aspirin, statin and beta blocker    Depression: Continue  with Cymbalta    GERD (gastroesophageal reflux disease): Continue PPI    Superficial/shallow dry ulcer in right breast: Further workup deferred to the outpatient setting. I have instructed the patient to let her PCP know at her next follow-up about this lesion.  TODAY-DAY OF DISCHARGE:  Subjective:   Summar Mcglothlin today has no headache,no chest abdominal pain,no new weakness tingling or numbness, feels much better wants to go home today.   Objective:  Blood pressure 127/45, pulse 67, temperature 98 F (36.7 C), temperature source Oral, resp. rate 18, height 5\' 2"  (1.575 m), weight 81.647 kg (180 lb), SpO2 97 %.  Intake/Output Summary (Last 24 hours) at 02/04/15 1040 Last data filed at 02/04/15 0350  Gross per 24 hour  Intake      0 ml  Output   1200 ml  Net  -1200 ml   Filed Weights   02/02/15 1422  Weight: 81.647 kg (180 lb)    Exam Awake Alert, Oriented *3, No new F.N deficits, Normal affect [place].AT,PERRAL Supple Neck,No JVD, No cervical lymphadenopathy appriciated.  Symmetrical Chest wall movement, Good air movement bilaterally, CTAB RRR,No Gallops,Rubs or new Murmurs, No Parasternal Heave +ve B.Sounds, Abd Soft, Non tender, No organomegaly appriciated, No rebound -guarding or rigidity. No Cyanosis, Clubbing or edema, No new Rash or bruise  DISCHARGE CONDITION: Stable  DISPOSITION: Home  DISCHARGE INSTRUCTIONS:    Activity:  As tolerated   Diet recommendation: Heart Healthy diet   Discharge Instructions    Diet - low sodium heart healthy    Complete by:  As directed      Increase activity slowly    Complete by:  As directed            Follow-up Information    Follow up with FULP, CAMMIE, MD. Schedule an appointment as soon as possible for a visit in 1 week.   Specialty:  Family Medicine   Why:  ask her to check CBC   Contact information:   [address] [place] [address] [phone]       Follow up with Landry Dyke, MD.  Schedule an appointment as soon as possible for a visit in 1 week.   Specialty:  Gastroenterology   Contact information:   [address], [place] [address] [phone]      Total Time spent on discharge equals 45 minutes.  SignedOren Binet 02/04/2015 10:40 AM

## 2015-02-04 NOTE — Progress Notes (Addendum)
NP Rogue Bussing notified that patient has elevated D-Dimer from 2/25. Orders to leave sticky note for attending MD.

## 2015-02-04 NOTE — Care Management Note (Signed)
    Page 1 of 1   02/04/2015     2:43:26 PM CARE MANAGEMENT NOTE 02/04/2015  Patient:  Denise Cuevas, Denise Cuevas   Account Number:  [id number]  Date Initiated:  02/03/2015  Documentation initiated by:  Marylyn Ishihara  Subjective/Objective Assessment:   Admitting Dx:  Acute RespiratoryDisease     Action/Plan:   Discharge Planning   Anticipated DC Date:  02/06/2015   Anticipated DC Plan:  [place]  CM consult      Choice offered to / List presented to:     DME arranged  NEBULIZER MACHINE      DME agency  [employer].        Status of service:  Completed, signed off Medicare Important Message given?   (If response is "NO", the following Medicare IM given date fields will be blank) Date Medicare IM given:   Medicare IM given by:   Date Additional Medicare IM given:   Additional Medicare IM given by:    Discharge Disposition:  HOME/SELF CARE  Per UR Regulation:    If discussed at Long Length of Stay Meetings, dates discussed:    Comments:  02/04/15 12:15 CM received call from RN requesting arrangement for Neb machine. CM called [facility] DME rep, Jeneen Rinks to please deliver to room prior to discharge.  No other CM needs were communicated.  Mariane Masters, BSn, CM [phone].

## 2015-02-04 NOTE — Progress Notes (Signed)
Nsg Discharge Note  Admit Date:  02/02/2015 Discharge date: 02/04/2015   Denise Cuevas to be D/C'd Home per MD order.  AVS completed.  Copy for chart, and copy for patient signed, and dated. Patient/caregiver able to verbalize understanding.  Discharge Medication:   Medication List    TAKE these medications        albuterol (2.5 MG/3ML) 0.083% nebulizer solution  Commonly known as:  PROVENTIL  Take 3 mLs (2.5 mg total) by nebulization every 2 (two) hours as needed for wheezing.     albuterol 108 (90 BASE) MCG/ACT inhaler  Commonly known as:  PROVENTIL HFA;VENTOLIN HFA  Inhale 2 puffs into the lungs every 6 (six) hours as needed for wheezing or shortness of breath.     aspirin 81 MG tablet  Take 81 mg by mouth daily.     atorvastatin 40 MG tablet  Commonly known as:  LIPITOR  Take 40 mg by mouth daily.     benzonatate 200 MG capsule  Commonly known as:  TESSALON  Take 1 capsule (200 mg total) by mouth 3 (three) times daily as needed for cough.     DULoxetine 30 MG capsule  Commonly known as:  CYMBALTA  Take 30 mg by mouth daily with lunch.     DULoxetine 60 MG capsule  Commonly known as:  CYMBALTA  Take 60 mg by mouth daily with lunch.     ferrous sulfate 325 (65 FE) MG tablet  Take 1 tablet (325 mg total) by mouth 2 (two) times daily with a meal.     Fluticasone-Salmeterol 250-50 MCG/DOSE Aepb  Commonly known as:  ADVAIR DISKUS  Inhale 1 puff into the lungs 2 (two) times daily.     guaiFENesin 600 MG 12 hr tablet  Commonly known as:  MUCINEX  Take 1 tablet (600 mg total) by mouth 2 (two) times daily.     insulin aspart protamine- aspart (70-30) 100 UNIT/ML injection  Commonly known as:  NOVOLOG MIX 70/30  Inject 60 Units into the skin 2 (two) times daily with a meal.     levofloxacin 500 MG tablet  Commonly known as:  LEVAQUIN  Take 1 tablet (500 mg total) by mouth daily.     levothyroxine 50 MCG tablet  Commonly known as:  SYNTHROID, LEVOTHROID  Take 50  mcg by mouth daily before breakfast.     lisinopril 5 MG tablet  Commonly known as:  PRINIVIL,ZESTRIL  Take 5 mg by mouth daily with lunch.     metoprolol tartrate 25 MG tablet  Commonly known as:  LOPRESSOR  Take 12.5 mg by mouth 2 (two) times daily.     nitroGLYCERIN 0.4 MG SL tablet  Commonly known as:  NITROSTAT  Place 0.4 mg under the tongue every 5 (five) minutes as needed. For chest pain     omeprazole-sodium bicarbonate 40-1100 MG per capsule  Commonly known as:  ZEGERID  Take 1 capsule by mouth daily before breakfast.     predniSONE 20 MG tablet  Commonly known as:  DELTASONE  Take 20 mg by mouth daily with breakfast. 60 mg for on day 1, 40 mg on day 2, 20 mg for day 3 and 4. 10 mg for 5 more days. Total of 9 day therapy.     sitaGLIPtin-metformin 50-1000 MG per tablet  Commonly known as:  JANUMET  Take 1 tablet by mouth 2 (two) times daily with a meal.        Discharge Assessment: Danley Danker  Vitals:   02/04/15 1031  BP: 127/45  Pulse: 67  Temp:   Resp:   Has small open wound on R breast. IV catheter discontinued intact. Site without signs and symptoms of complications - no redness or edema noted at insertion site, patient denies c/o pain - only slight tenderness at site.  Dressing with slight pressure applied.  D/c Instructions-Education: Discharge instructions given to patient/family with verbalized understanding. D/c education completed with patient/family including follow up instructions, medication list, d/c activities limitations if indicated, with other d/c instructions as indicated by MD - patient able to verbalize understanding, all questions fully answered. Patient instructed to return to ED, call 911, or call MD for any changes in condition.  Patient escorted via [place], and D/C home via private auto.  [name], RN 02/04/2015 1:59 PM

## 2015-03-20 ENCOUNTER — Encounter: Payer: Self-pay | Admitting: Internal Medicine

## 2015-03-20 ENCOUNTER — Ambulatory Visit (INDEPENDENT_AMBULATORY_CARE_PROVIDER_SITE_OTHER): Payer: BC Managed Care – PPO | Admitting: Internal Medicine

## 2015-03-20 VITALS — BP 122/80 | HR 74 | Ht 62.0 in | Wt 180.0 lb

## 2015-03-20 DIAGNOSIS — R0600 Dyspnea, unspecified: Secondary | ICD-10-CM

## 2015-03-20 DIAGNOSIS — I10 Essential (primary) hypertension: Secondary | ICD-10-CM | POA: Diagnosis not present

## 2015-03-20 MED ORDER — VALSARTAN 80 MG PO TABS: 80.0000 mg | ORAL_TABLET | Freq: Every day | ORAL | Status: DC

## 2015-03-20 MED ORDER — VALSARTAN 160 MG PO TABS: 160.0000 mg | ORAL_TABLET | Freq: Every day | ORAL | Status: DC

## 2015-03-20 NOTE — Assessment & Plan Note (Signed)
ACE inhibitors are problematic in  pts with airway complaints because  even experienced pulmonologists can't always distinguish ace effects from copd/asthma.  By themselves they don't actually cause a problem, much like oxygen can't by itself start a fire, but they certainly serve as a powerful catalyst or enhancer for any "fire"  or inflammatory process in the upper airway, be it caused by an ET  tube or more commonly reflux (especially in the obese or pts with known GERD or who are on biphoshonates).    In the era of ARB near equivalency needs trial off acei > rec valsartan 80 mg daily and recheck in 4 weeks

## 2015-03-20 NOTE — Progress Notes (Signed)
Subjective:    Patient ID: Denise Cuevas, female    DOB: 1951-05-25,   MRN: [id number]  HPI  4 yowf never smoker asthma as child better by 86's no need for any meds then acutely  sick x 2 days PTA :  Admit Date: 02/02/2015 Discharge date: 02/04/2015    PRIMARY DISCHARGE DIAGNOSIS:  Acute respiratory disease   Hypothyroidism  CAD (coronary artery disease)  Depression  GERD (gastroesophageal reflux disease)  SOB (shortness of breath)  Acute respiratory distress   03/20/2015 1st [facility] office visit/ Melvyn Novas  /consultation  Chief Complaint  Patient presents with  . Pulmonary Consult    Referred by Dr. Chapman Fitch. Pt states recently hospitalized with acute respiratory distress.  She was admitted 02/02/15- 02/04/15. She states that she gets out of breath walking from room to room at home.   says had improved some  by d/c but still not back to baseline with persistent new sensation of something stuck in throat and upper chest congestion on continued ACEi and newly on Advair with doe room to room Sleeping ok / requiring alb avg qod and neb rarely and needed neither prior to admit   No obvious other patterns in day to day or daytime variabilty or assoc chronic cough or cp or chest tightness, subjective wheeze overt sinus or hb symptoms. No unusual exp hx or h/o childhood pna/ asthma or knowledge of premature birth.  Sleeping ok without nocturnal  or early am exacerbation  of respiratory  c/o's or need for noct saba. Also denies any obvious fluctuation of symptoms with weather or environmental changes or other aggravating or alleviating factors except as outlined above   Current Medications, Allergies, Complete Past Medical History, Past Surgical History, Family History, and Social History were reviewed in [employer] [facility] record.             Review of Systems  Constitutional: Negative for fever, chills and unexpected weight change.  HENT: Negative for  congestion, dental problem, ear pain, nosebleeds, postnasal drip, rhinorrhea, sinus pressure, sneezing, sore throat, trouble swallowing and voice change.   Eyes: Negative for visual disturbance.  Respiratory: Positive for shortness of breath. Negative for cough and choking.   Cardiovascular: Negative for chest pain and leg swelling.  Gastrointestinal: Negative for vomiting, abdominal pain and diarrhea.  Genitourinary: Negative for difficulty urinating.  Musculoskeletal: Negative for arthralgias.  Skin: Negative for rash.  Neurological: Negative for tremors, syncope and headaches.  Hematological: Does not bruise/bleed easily.       Objective:   Physical Exam   amb wf nad  Wt Readings from Last 3 Encounters:  03/20/15 180 lb (81.647 kg)  02/02/15 180 lb (81.647 kg)  08/24/14 178 lb (80.74 kg)    Vital signs reviewed   HEENT: nl dentition, turbinates, and orophanx. Nl external ear canals without cough reflex   NECK :  without JVD/Nodes/TM/ nl carotid upstrokes bilaterally   LUNGS: no acc muscle use, clear to A and P bilaterally without cough on insp or exp maneuvers   CV:  RRR  no s3 or murmur or increase in P2, no edema   ABD:  soft and nontender with nl excursion in the supine position. No bruits or organomegaly, bowel sounds nl  MS:  warm without deformities, calf tenderness, cyanosis or clubbing  SKIN: warm and dry without lesions    NEURO:  alert, approp, no deficits     I personally reviewed images and agree with radiology impression as  follows:  CXR:  02/02/15  No active cardiopulmonary disease      Assessment & Plan:

## 2015-03-20 NOTE — Patient Instructions (Addendum)
Stop advair and lisinopril  Start valsartan 80 mg one daily   Only use your albuterol inhaler as a rescue medication to be used if you can't catch your breath by resting or doing a relaxed purse lip breathing pattern.  - The less you use it, the better it will work when you need it. - Ok to use up to 2 puffs  every 4 hours if you must but call for immediate appointment if use goes up over your usual need - Don't leave home without it !!  (think of it like the spare tire for your car)  - ok to use your neb if you use the inhaler first and it doesn't work.   Please schedule a follow up office visit in 4 weeks, sooner if needed

## 2015-03-20 NOTE — Assessment & Plan Note (Addendum)
Echo 02/03/15: - Left ventricle: The cavity size was normal. Wall thickness was normal. Systolic function was normal. The estimated ejection fraction was in the range of 60% to 65%. - Aortic valve: AV is thckened, calcified with mildly restricted motion. Peak and mean gradients through the valve are 35 and 19 mm Hg respectively consistent with mild AS. There was trivial regurgitation. - Mitral valve: There was mild regurgitation. - Left atrium: The atrium was mildly dilated - 03/20/2015  Walked RA x 3 laps @ 185 ft each stopped due to end of study, mild chest discomfort/sob but no desat at nl pace    Symptoms are markedly disproportionate to objective findings and not clear this is a lung problem but pt does appear to have difficult airway management issues. DDX of  difficult airways management all start with A and  include Adherence, Ace Inhibitors, Acid Reflux, Active Sinus Disease, Alpha 1 Antitripsin deficiency, Anxiety masquerading as Airways dz,  ABPA,  allergy(esp in young), Aspiration (esp in elderly), Adverse effects of DPI,  Active smokers, plus two Bs  = Bronchiectasis and Beta blocker use..and one C= CHF   ACEi at top of list of usual suspects > only way to know is trial off > see hbp a/p  Adverse effects of dpi, esp advair > try off and just use saba prn   ? Acid (or non-acid) GERD > always difficult to exclude as up to 75% of pts in some series report no assoc GI/ Heartburn symptoms> rec continue max (24h)  acid suppression and diet restrictions/ reviewed     ? CHF from AS - see Echo 02/03/15: - Left ventricle: The cavity size was normal. Wall thickness was normal. Systolic function was normal. The estimated ejection fraction was in the range of 60% to 65%. - Aortic valve: AV is thckened, calcified with mildly restricted motion. Peak and mean gradients through the valve are 35 and 19 mm Hg respectively consistent with mild AS. There was  trivial regurgitation. - Mitral valve: There was mild regurgitation. - Left atrium: The atrium was mildly dilated  F/u in 4 weeks to regroup

## 2015-03-21 ENCOUNTER — Encounter: Payer: Self-pay | Admitting: Internal Medicine

## 2015-04-20 ENCOUNTER — Encounter: Payer: Self-pay | Admitting: Internal Medicine

## 2015-04-20 ENCOUNTER — Telehealth: Payer: Self-pay | Admitting: *Deleted

## 2015-04-20 ENCOUNTER — Ambulatory Visit (INDEPENDENT_AMBULATORY_CARE_PROVIDER_SITE_OTHER): Payer: BC Managed Care – PPO | Admitting: Internal Medicine

## 2015-04-20 VITALS — BP 118/76 | HR 72 | Ht 62.0 in | Wt 177.0 lb

## 2015-04-20 DIAGNOSIS — I10 Essential (primary) hypertension: Secondary | ICD-10-CM | POA: Diagnosis not present

## 2015-04-20 DIAGNOSIS — R0600 Dyspnea, unspecified: Secondary | ICD-10-CM | POA: Diagnosis not present

## 2015-04-20 NOTE — Patient Instructions (Addendum)
I would avoid any future use of ACE inhibitors based on your response  Pulmonary follow up is as needed but we need to see you right away if your use of albuterol goes up  Late add : refer back for cards f/u Dr [name] to review newest echo and set up serial f/u if needed

## 2015-04-20 NOTE — Telephone Encounter (Signed)
-----   Message from Tanda Rockers, MD sent at 04/20/2015  1:20 PM EDT ----- refer back for cards f/u Dr [name] to review newest echo and set up serial f/u if needed

## 2015-04-20 NOTE — Telephone Encounter (Signed)
LMTCB for the pt 

## 2015-04-20 NOTE — Assessment & Plan Note (Addendum)
Echo 02/03/15: - Left ventricle: The cavity size was normal. Wall thickness was normal. Systolic function was normal. The estimated ejection fraction was in the range of 60% to 65%. - Aortic valve: AV is thckened, calcified with mildly restricted motion. Peak and mean gradients through the valve are 35 and 19 mm Hg respectively consistent with mild AS. There was trivial regurgitation. - Mitral valve: There was mild regurgitation. - Left atrium: The atrium was mildly dilated - 03/20/2015  Walked RA x 3 laps @ 185 ft each stopped due to end of study, mild chest discomfort/sob but no desat at nl pace  - trial off acei 03/20/2015 > all symptoms resolved at f/u ov 04/20/2015 > pulmonary f/u prn    I had an extended final summary discussion with the patient reviewing all relevant studies completed to date and  lasting 15 to 20 minutes of a 25 minute visit on the following issues:     1) clearly her recent illness was aggravated by ACEi use and she's now gone from doe across the room to no doe at all despite cough not being a really prominent part of her symptom complex   2) ACE inhibitors are problematic in  pts with airway complaints because  even experienced pulmonologists can't always distinguish ace effects from copd/asthma/pnds/ allergies etc.  By themselves they don't actually cause a problem, much like oxygen can't by itself start a fire, but they certainly serve as a powerful catalyst or enhancer for any "fire"  or inflammatory process in the upper airway, be it caused by an ET  tube or more commonly reflux (especially in the obese or pts with known GERD or who are on biphoshonates) or URI's, due to interference with bradykinin clearance.  The effects of acei on bradykinin levels occurs in 100% of pt's on acei (unless they surreptitiously stop the med!) but the classic cough is only reported in 5%.  This leaves 95% of pts on acei's  with a variety of syndromes including no identifiable  symptom in most  vs non-specific symptoms that wax and wane depending on what other insult is occuring at the level of the upper airway, which is probably the case here.  3) no pulmonary f/u needed at this point but reviewed with here carefully the need to return for increase saba need for any reason other than very short term situations   4) Dr [name] following AS   See instructions for specific recommendations which were reviewed directly with the patient who was given a copy with highlighter outlining the key components.

## 2015-04-20 NOTE — Assessment & Plan Note (Signed)
Trial off acei 03/20/2015 due to pseudowheeze > resolved  Adequate control on present rx, reviewed > no change in rx needed  = valsartan 80 mg daily

## 2015-04-20 NOTE — Progress Notes (Signed)
Subjective:    Patient ID: Denise Cuevas, female    DOB: 03-Jan-1951,   MRN: [id number]    Brief patient profile:  69 yowf never smoker asthma as child better by 89's no need for any meds then acutely  sick x 2 days PTA :  Admit Date: 02/02/2015 Discharge date: 02/04/2015    PRIMARY DISCHARGE DIAGNOSIS:  Acute respiratory disease   Hypothyroidism  CAD (coronary artery disease)  Depression  GERD (gastroesophageal reflux disease)  SOB (shortness of breath)  Acute respiratory distress   03/20/2015 1st [facility] office visit/ Melvyn Novas  /consultation  Chief Complaint  Patient presents with  . Pulmonary Consult    Referred by Dr. Chapman Fitch. Pt states recently hospitalized with acute respiratory distress.  She was admitted 02/02/15- 02/04/15. She states that she gets out of breath walking from room to room at home.   says had improved some  by d/c but still not back to baseline with persistent new sensation of something stuck in throat and upper chest congestion on continued ACEi and newly on Advair with doe room to room Sleeping ok / requiring alb avg qod and neb rarely and needed neither prior to admit  rec Stop advair and lisinopril Start valsartan 80 mg one daily  Only use your albuterol inhaler as a rescue medication  - ok to use your neb if you use the inhaler first and it doesn't work.    04/20/2015 f/u ov/Jessica Checketts re: uacs off acei   Chief Complaint  Patient presents with  . Follow-up    Pt states that her breathing is much improved. Using albuterol maybe once per wk on average and has not had to use neb.    rarely used saba prior to admit above and completely back to baselne now. Not limited by breathing from desired activities    No obvious day to day or daytime variabilty or assoc chronic cough or cp or chest tightness, subjective wheeze overt sinus or hb symptoms. No unusual exp hx or h/o childhood pna/ asthma or knowledge of premature birth.  Sleeping ok without  nocturnal  or early am exacerbation  of respiratory  c/o's or need for noct saba. Also denies any obvious fluctuation of symptoms with weather or environmental changes or other aggravating or alleviating factors except as outlined above   Current Medications, Allergies, Complete Past Medical History, Past Surgical History, Family History, and Social History were reviewed in [employer] [facility] record.  ROS  The following are not active complaints unless bolded sore throat, dysphagia, dental problems, itching, sneezing,  nasal congestion or excess/ purulent secretions, ear ache,   fever, chills, sweats, unintended wt loss, pleuritic or exertional cp, hemoptysis,  orthopnea pnd or leg swelling, presyncope, palpitations, heartburn, abdominal pain, anorexia, nausea, vomiting, diarrhea  or change in bowel or urinary habits, change in stools or urine, dysuria,hematuria,  rash, arthralgias, visual complaints, headache, numbness weakness or ataxia or problems with walking or coordination,  change in mood/affect or memory.                Objective:   Physical Exam   amb wf nad   04/20/2015         177  Wt Readings from Last 3 Encounters:  03/20/15 180 lb (81.647 kg)  02/02/15 180 lb (81.647 kg)  08/24/14 178 lb (80.74 kg)    Vital signs reviewed   HEENT: nl dentition, turbinates, and orophanx. Nl external ear canals without cough reflex  NECK :  without JVD/Nodes/TM/ nl carotid upstrokes bilaterally   LUNGS: no acc muscle use, clear to A and P bilaterally without cough on insp or exp maneuvers   CV:  RRR   II/VI sem with no s3 or increase in P2, no edema   ABD:  soft and nontender with nl excursion in the supine position. No bruits or organomegaly, bowel sounds nl  MS:  warm without deformities, calf tenderness, cyanosis or clubbing  SKIN: warm and dry without lesions    NEURO:  alert, approp, no deficits     I personally reviewed images and agree with radiology  impression as follows:  CXR:  02/02/15  No active cardiopulmonary disease      Assessment & Plan:

## 2015-04-21 NOTE — Telephone Encounter (Signed)
lmtcb x2 for pt. 

## 2015-04-21 NOTE — Telephone Encounter (Signed)
Pt returning call and can be reached @ [phone].Denise Cuevas

## 2015-04-25 NOTE — Telephone Encounter (Signed)
LMTCB

## 2015-04-28 NOTE — Telephone Encounter (Signed)
Called and spoke to pt. Informed her to f/u with Dr. [name] for recent echo. Pt verbalized understanding and stated she would call them to make an appt. Nothing further needed at this time.

## 2015-05-23 ENCOUNTER — Other Ambulatory Visit: Payer: Self-pay | Admitting: Gastroenterology

## 2015-05-23 DIAGNOSIS — C220 Liver cell carcinoma: Secondary | ICD-10-CM

## 2015-05-26 ENCOUNTER — Ambulatory Visit
Admission: RE | Admit: 2015-05-26 | Discharge: 2015-05-26 | Disposition: A | Discharge status: Home / Self Care | Payer: BC Managed Care – PPO | Source: Ambulatory Visit | Attending: Gastroenterology | Admitting: Gastroenterology

## 2015-05-26 DIAGNOSIS — C220 Liver cell carcinoma: Secondary | ICD-10-CM

## 2015-05-29 ENCOUNTER — Ambulatory Visit (INDEPENDENT_AMBULATORY_CARE_PROVIDER_SITE_OTHER): Payer: BC Managed Care – PPO | Admitting: Physician Assistant

## 2015-05-29 ENCOUNTER — Telehealth (HOSPITAL_COMMUNITY): Payer: Self-pay | Admitting: *Deleted

## 2015-05-29 ENCOUNTER — Encounter: Payer: Self-pay | Admitting: Physician Assistant

## 2015-05-29 VITALS — BP 140/70 | HR 90

## 2015-05-29 DIAGNOSIS — R0789 Other chest pain: Secondary | ICD-10-CM

## 2015-05-29 DIAGNOSIS — R079 Chest pain, unspecified: Secondary | ICD-10-CM | POA: Insufficient documentation

## 2015-05-29 DIAGNOSIS — I25118 Atherosclerotic heart disease of native coronary artery with other forms of angina pectoris: Secondary | ICD-10-CM

## 2015-05-29 DIAGNOSIS — I10 Essential (primary) hypertension: Secondary | ICD-10-CM

## 2015-05-29 DIAGNOSIS — R011 Cardiac murmur, unspecified: Secondary | ICD-10-CM | POA: Diagnosis not present

## 2015-05-29 DIAGNOSIS — I200 Unstable angina: Secondary | ICD-10-CM

## 2015-05-29 DIAGNOSIS — R0600 Dyspnea, unspecified: Secondary | ICD-10-CM | POA: Diagnosis not present

## 2015-05-29 DIAGNOSIS — R413 Other amnesia: Secondary | ICD-10-CM

## 2015-05-29 NOTE — Patient Instructions (Addendum)
Medication Instructions:   .INSTUCR   Labwork:   Testing/Procedures:  Your physician has requested that you have en exercise stress myoview. For further information please visit [email or web address]. Please follow instruction sheet, as given.  Follow-UP  WITH DR [name] IN 3 TO 4 MONTHS OR NEXT AVAILABLE    Any Other Special Instructions Will Be Listed Below (If Applicable).  MAKE SURE YOU FOLLOW UP WITH AN NEUROLOGIST OF CHOICE

## 2015-05-29 NOTE — Telephone Encounter (Signed)
Left message on voicemail in reference to upcoming appointment scheduled for 05/30/15. Phone number given for a call back so details instructions can be given. Mintie Witherington, Ranae Palms

## 2015-05-29 NOTE — Assessment & Plan Note (Signed)
Patient can planes of exertional chest pain as well as some atypical chest pain. 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 Last stress test December 2013. Recommend follow-up with stress Myoview. Follow-up with Dr. [name].

## 2015-05-29 NOTE — Assessment & Plan Note (Signed)
Patient has significant memory loss which is causing many lifestyle adjustments for her. She states that her gastroenterologist thinks it may be due to her psoriasis and she will start a new medication tomorrow. If this does not help I recommend she see a neurologist.

## 2015-05-29 NOTE — Telephone Encounter (Signed)
Patient given detailed instructions per Myocardial Perfusion Study Information Sheet for test on 05/30/15 at 8:15. Patient Notified to arrive 15 minutes early, and that it is imperative to arrive on time for appointment to keep from having the test rescheduled. Patient verbalized understanding. Glade Lloyd

## 2015-05-29 NOTE — Assessment & Plan Note (Signed)
Blood pressure stable off ACE inhibitor which was stopped because of dyspnea on exertion by Dr. Melvyn Novas

## 2015-05-29 NOTE — Assessment & Plan Note (Signed)
CAD with recurrent chest pain. Recommend stress Myoview.

## 2015-05-29 NOTE — Assessment & Plan Note (Signed)
Patient has mild AS and 2-D echo in 01/2015. Will need follow-up echo 01/2016

## 2015-05-29 NOTE — Progress Notes (Signed)
Cardiology Office Note   Date:  05/29/2015   ID:  [name], DOB 1951-09-28, MRN [id number]  PCP:  Antony Blackbird, MD  Cardiologist:  Dr. Candee Furbish  Chief Complaint: Chest pain    History of Present Illness: Denise Cuevas is a 64 y.o. female who presents for follow-up. She has a history of CABG in 11/2011 with a LIMA to the LAD and SVG to the OM. Follow-up cath in 2013 revealed 95% SVG to the OM at the distal insertion there was a 90% stenosis at its bands entire insertion point. Disease extends into the anterior grade and retrograde branches. EF 50%. She underwent drug-eluting stent to the proximal circumflex 03/24/12. She was taken back to the Cath Lab on 05/06/12 and underwent successful drug-eluting stent placement to the distal LAD. She had a widely patent RCA, well opposed stents in the left circumflex and OM. Patient also has history of hypertension and obesity. Recently her ACE inhibitor was stopped by Dr. Melvyn Novas because of dyspnea on exertion. 2-D echo in 01/2015 showed normal LV function EF 60-65% with thickened aortic valve a mildly restricted motion. There was mild AF and trivial AI. Mild MR.  Patient hasn't been here since 2014. She complains of chest tightness and pressure with exertion. Last night she had an episode of chest pain described as sharp shooting and stabbing. She said there was some tightness there is well. She said she did rub her chest and eases it. It lasted about one hour. She forgets to take nitroglycerin when she has chest pain. She can only walk half a mile before she develops chest pressure, shortness of breath and just gives out. This is new since she was last here.  Patient also complains of significant memory problems. She has 3 master's degrees and can hardly remember what words to say. She has become very forgetful and does often get lost. Her gastroenterologist thinks it's coming from her cirrhosis and is beginning a new medication tomorrow. She does not  know what this medication is. She is very tearful and upset about her memory. She is on several boards and is not comfortable speaking anymore.   Past Medical History  Diagnosis Date  . Hypertension   . Hypothyroid   . Fibromyalgia   . Hypercholesteremia   . Angina   . GERD (gastroesophageal reflux disease)   . Depression   . H/O hiatal hernia   . Asthma   . Heart murmur   . Diabetes mellitus     type 2  . Headache(784.0)     occ. generalized  . Cirrhosis of liver     08-12-14 one week ago" told has cirrhosis"  . Edema extremities     consistent edema lower legs, feet, and right quadrant abdominal pain-"goes and comes"  . Coronary artery disease     s/p CABG  . Myocardial infarction 11/2011    MI x2- 6'73,4'19 coronary stent(chest pain episode at time)  . PONV (postoperative nausea and vomiting)     Past Surgical History  Procedure Laterality Date  . Knee surgery Right     scope surgery  . Coronary angioplasty with stent placement  05/06/2012  . Cholecystectomy      open many yrs ago  . Coronary artery bypass graft  11/27/2011    Procedure: CORONARY ARTERY BYPASS GRAFTING (CABG);  Surgeon: [name], MD;  Location: [place];  Service: Open Heart Surgery;  Laterality: N/A;  coronary artery bypass graft on pump times  two utilizing left internal mammary artery and right saphenous vein harvested endoscopically   . Esophagogastroduodenoscopy (egd) with propofol N/A 08/24/2014    Procedure: ESOPHAGOGASTRODUODENOSCOPY (EGD) WITH PROPOFOL;  Surgeon: Arta Silence, MD;  Location: [facility];  Service: Endoscopy;  Laterality: N/A;  . Colonoscopy with propofol N/A 08/24/2014    Procedure: COLONOSCOPY WITH PROPOFOL;  Surgeon: Arta Silence, MD;  Location: [facility];  Service: Endoscopy;  Laterality: N/A;  . Left heart catheterization with coronary angiogram N/A 11/19/2011    Procedure: LEFT HEART CATHETERIZATION WITH CORONARY ANGIOGRAM;  Surgeon: Candee Furbish, MD;  Location: [facility]  CATH LAB;  Service: Cardiovascular;  Laterality: N/A;  . Left heart catheterization with coronary/graft angiogram N/A 03/24/2012    Procedure: LEFT HEART CATHETERIZATION WITH Beatrix Fetters;  Surgeon: Jettie Booze, MD;  Location: [facility] CATH LAB;  Service: Cardiovascular;  Laterality: N/A;  . Percutaneous coronary stent intervention (pci-s)  03/24/2012    Procedure: PERCUTANEOUS CORONARY STENT INTERVENTION (PCI-S);  Surgeon: Jettie Booze, MD;  Location: [facility] CATH LAB;  Service: Cardiovascular;;  . Percutaneous coronary stent intervention (pci-s) Bilateral 05/06/2012    Procedure: PERCUTANEOUS CORONARY STENT INTERVENTION (PCI-S);  Surgeon: Jettie Booze, MD;  Location: [facility] CATH LAB;  Service: Cardiovascular;  Laterality: Bilateral;  . Left heart catheterization with coronary angiogram  05/06/2012    Procedure: LEFT HEART CATHETERIZATION WITH CORONARY ANGIOGRAM;  Surgeon: Jettie Booze, MD;  Location: [facility] CATH LAB;  Service: Cardiovascular;;     Current Outpatient Prescriptions  Medication Sig Dispense Refill  . albuterol (PROVENTIL HFA;VENTOLIN HFA) 108 (90 BASE) MCG/ACT inhaler Inhale 2 puffs into the lungs every 6 (six) hours as needed for wheezing or shortness of breath. 1 Inhaler 2  . albuterol (PROVENTIL) (2.5 MG/3ML) 0.083% nebulizer solution Take 3 mLs (2.5 mg total) by nebulization every 2 (two) hours as needed for wheezing. 75 mL 0  . aspirin 81 MG tablet Take 81 mg by mouth daily.    Marland Kitchen atorvastatin (LIPITOR) 40 MG tablet Take 40 mg by mouth daily.    . DULoxetine (CYMBALTA) 30 MG capsule Take 30 mg by mouth daily with lunch.     . DULoxetine (CYMBALTA) 60 MG capsule Take 60 mg by mouth daily with lunch.     . insulin aspart protamine- aspart (NOVOLOG MIX 70/30) (70-30) 100 UNIT/ML injection Inject 60 Units into the skin 2 (two) times daily with a meal.    . levothyroxine (SYNTHROID, LEVOTHROID) 50 MCG tablet Take 50 mcg by mouth daily before breakfast.    .  metoprolol tartrate (LOPRESSOR) 25 MG tablet Take 12.5 mg by mouth 2 (two) times daily.     . nitroGLYCERIN (NITROSTAT) 0.4 MG SL tablet Place 0.4 mg under the tongue every 5 (five) minutes as needed. For chest pain    . omeprazole-sodium bicarbonate (ZEGERID) 40-1100 MG per capsule Take 1 capsule by mouth daily before breakfast.    . oxycodone (OXY-IR) 5 MG capsule Take 5 mg by mouth every 6 (six) hours as needed.    . sitaGLIPtin-metformin (JANUMET) 50-1000 MG per tablet Take 1 tablet by mouth 2 (two) times daily with a meal.    . valACYclovir (VALTREX) 1000 MG tablet Take 1,000 mg by mouth 2 (two) times daily.    . valsartan (DIOVAN) 80 MG tablet Take 1 tablet (80 mg total) by mouth daily. 30 tablet 11   No current facility-administered medications for this visit.    Allergies:   Crestor; Exenatide; and Ace inhibitors    Social History:  The patient  reports that she has never smoked. She has never used smokeless tobacco. She reports that she does not drink alcohol or use illicit drugs.   Family History:  The patient's    family history includes Emphysema in her father; Heart disease (age of onset: 19) in her father; Peripheral vascular disease (age of onset: 27) in her mother.    ROS:  Please see the history of present illness.   Otherwise, review of systems are positive for none.   All other systems are reviewed and negative.    PHYSICAL EXAM: BP 140/70 mmHg  Pulse 90 GEN: Well nourished, well developed, in no acute distress Neck: no JVD, HJR, carotid bruits, or masses Cardiac:  RRR; 2/6 systolic murmur at the left sternal border, no gallop, rubs, thrill or heave,  Respiratory:  clear to auscultation bilaterally, normal work of breathing GI: soft, nontender, nondistended, + BS MS: no deformity or atrophy Extremities: without cyanosis, clubbing, edema, good distal pulses bilaterally.  Skin: warm and dry, no rash Neuro:  Strength and sensation are intact    EKG:  EKG is  ordered today. The ekg ordered today demonstrates normal sinus rhythm, normal EKG Recent Labs: 02/02/2015: B Natriuretic Peptide 256.8*; TSH 0.833 02/03/2015: BUN 10; Creatinine, Ser 0.87; Potassium 3.6; Sodium 139 02/04/2015: Hemoglobin 8.6*; Platelets 74*    Lipid Panel    Component Value Date/Time   CHOL 152 12/01/2012 0620   TRIG 46 12/01/2012 0620   HDL 59 12/01/2012 0620   CHOLHDL 2.6 12/01/2012 0620   VLDL 9 12/01/2012 0620   LDLCALC 84 12/01/2012 0620      Wt Readings from Last 3 Encounters:  04/20/15 177 lb (80.287 kg)  03/20/15 180 lb (81.647 kg)  02/02/15 180 lb (81.647 kg)      Other studies Reviewed: Additional studies/ records that were reviewed today include and review of the records demonstrates:   Cath 05/06/12 ANGIOGRAPHIC DATA:   The left main coronary artery is short, but widely patent..  The left anterior descending artery is occluded after the origin of the large first diagonal. The first diagonal has a moderate, 30-40% stenosis proximally.    The left circumflex artery is a large vessel.  The proximal stent is widely patent and appears well size.  The distal stent in the large OM is also widely patent.  The AV continuation artery is widely patent.  The right coronary artery is a medium-sized, dominant vessel.  There is some catheter-induced spasm proximally.  The SVG to OM is occluded.  The LIMA to LAD is widely patent.  In the anastomotic site, there is mild plaque in some views.  In the distal LAD, there is a focal 80% stenosis.  PCI NARRATIVE: Angiomax was given for anticoagulation.  A CLS 3.0 guiding catheters used.  A pro-water wire was placed down the circumflex into the large OM across both stents.  An intravascular ultrasound catheter was advanced to the second more distal stent to the midportion.  A pullback was performed.  We did not advance it further due to the small nature of the very distal obtuse marginal.  The intravascular ultrasound  shows that the stents in the circumflex are well apposed.  There are no stent struts which appear free from vessel wall.  And IMA guiding catheters used to engage the left internal mammary artery with some difficulty.  A pro-water wire was placed into the LIMA into the LAD.  This could not successfully navigate always into the  distal LAD.  A BMW wire was then advanced across the 80% stenosis in the distal LAD.  This was predilated with a 2.0 x 12 emerge balloon. A 2.25 x 12 Promus stent was then inflated to 11 atmospheres for 18 seconds across the diseased area .  There still appeared to be a waist in the stent.  This postdilated with a 2.5 x 9 [place] sprinter balloon to 16 atmospheres.  There was no residual stenosis.  TIMI-3 flow was maintained throughout.  Lesion length was 8 mm.    Severeal doses of IC NTG were given to treat vasospasm in the circumflex from the IVUS and in the LAD from the interventional equipment.  IMPRESSIONS:    1.  Widely patent right coronary artery.    2. Successful drug-eluting stent placement to the distal  left anterior descending artery. 3. Well apposed stents in the left circumflex artery and obtuse marginal.  LVEDP 9 mm Hg.  Left ventriculogram not performed.    RECOMMENDATION:   She should continue on dual antiplatelet therapy for at least a year.  Continue aggressive secondary prevention including diabetes control.  She will be watched overnight. .              Cath 03/24/12 IMPRESSIONS:    4. Patent RCA, LIMA to distal LAD, large first diagonal from the native proximal LAD.  The mid LAD is occluded. 5. Ostial 95% stenosis in the SVG to circumflex.  90% stenosis in the insertion of the SVG to OM which extended into the anterograde and retrograde branches. 6. DES to proximal circumflex with a 2.75 x 32 Promus stent, postdilated to > 3.0 mm in diameter.  DES to OM1 with a 2.5 x 24 Promus.  TIMI 3 flow restored. 7. Normal right coronary artery.  There was some  catheter-induced spasm noted in the RCA. 8. Low normal left ventricular systolic function.  LVEDP 22 mmHg.  Ejection fraction 50%.  RECOMMENDATION:  Continue dual antiplatelet therapy for at least a year.  I discussed with the patient that her vessels were likely smaller in diameter than usual due to her acute MI.  Consideration would have to be made for repeat cath with possible IVUS to make sure that her stents are well apposed.  This could be done in the next few months.  She'll be watched in the step down unit tonight.  She'll be in the hospital for at least a few days.    Echo 02/03/15: - Left ventricle: The cavity size was normal. Wall thickness was   normal. Systolic function was normal. The estimated ejection   fraction was in the range of 60% to 65%. - Aortic valve: AV is thckened, calcified with mildly restricted   motion. Peak and mean gradients through the valve are 35 and 19   mm Hg respectively consistent with mild AS. There was trivial   regurgitation. - Mitral valve: There was mild regurgitation. - Left atrium: The atrium was mildly dilated - 03/20/2015  Walked RA x 3 laps @ 185 ft each stopped due to end of study, mild chest discomfort/sob but no desat at nl pace   - trial off acei 03/20/2015 > all symptoms resolved at f/u ov 04/20/2015 > pulmonary f/u prn   Stress test 11/2012: IMPRESSION: No evidence for pharmacologically induced ischemia.   Calculated ejection fraction is 73%.        ASSESSMENT AND PLAN: Chest pain Patient can planes of exertional chest pain as well as some  atypical chest pain. 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 Last stress test December 2013. Recommend follow-up with stress Myoview. Follow-up with Dr. [name].  Heart murmur Patient has mild AS and 2-D echo in 01/2015. Will need follow-up echo 01/2016  Essential hypertension Blood pressure stable off ACE inhibitor which was stopped because of dyspnea on  exertion by Dr. Melvyn Novas  CAD (coronary artery disease) CAD with recurrent chest pain. Recommend stress Myoview.  Memory loss Patient has significant memory loss which is causing many lifestyle adjustments for her. She states that her gastroenterologist thinks it may be due to her psoriasis and she will start a new medication tomorrow. If this does not help I recommend she see a neurologist.     Signed, Ermalinda Barrios, PA-C  05/29/2015 [address], [place],   [address] Phone: [phone]; Fax: [phone]

## 2015-05-30 ENCOUNTER — Ambulatory Visit (HOSPITAL_COMMUNITY): Payer: BC Managed Care – PPO | Attending: Cardiology

## 2015-05-30 DIAGNOSIS — I200 Unstable angina: Secondary | ICD-10-CM

## 2015-05-30 DIAGNOSIS — R9439 Abnormal result of other cardiovascular function study: Secondary | ICD-10-CM | POA: Diagnosis not present

## 2015-05-30 LAB — MYOCARDIAL PERFUSION IMAGING
CSEPPHR: 99 {beats}/min
LHR: 0.34
LV dias vol: 93 mL
LV sys vol: 38 mL
Rest HR: 73 {beats}/min
SDS: 3
SRS: 5
SSS: 8
TID: 0.93

## 2015-05-30 MED ORDER — REGADENOSON 0.4 MG/5ML IV SOLN
0.4000 mg | Freq: Once | INTRAVENOUS | Status: AC
  Administered 2015-05-30: 0.4 mg via INTRAVENOUS

## 2015-05-30 MED ORDER — TECHNETIUM TC 99M SESTAMIBI GENERIC - CARDIOLITE
33.0000 | Freq: Once | INTRAVENOUS | Status: AC | PRN
  Administered 2015-05-30: 33 via INTRAVENOUS

## 2015-07-17 ENCOUNTER — Other Ambulatory Visit: Payer: Self-pay

## 2015-07-17 DIAGNOSIS — Z1231 Encounter for screening mammogram for malignant neoplasm of breast: Secondary | ICD-10-CM

## 2015-07-21 ENCOUNTER — Ambulatory Visit
Admission: RE | Admit: 2015-07-21 | Discharge: 2015-07-21 | Disposition: A | Discharge status: Home / Self Care | Payer: BC Managed Care – PPO | Source: Ambulatory Visit

## 2015-07-21 ENCOUNTER — Ambulatory Visit: Payer: BC Managed Care – PPO

## 2015-07-21 DIAGNOSIS — Z1231 Encounter for screening mammogram for malignant neoplasm of breast: Secondary | ICD-10-CM

## 2016-03-20 ENCOUNTER — Other Ambulatory Visit: Payer: Self-pay | Admitting: Internal Medicine

## 2016-05-08 ENCOUNTER — Other Ambulatory Visit: Payer: Self-pay | Admitting: Internal Medicine

## 2016-05-08 MED ORDER — VALSARTAN 80 MG PO TABS: 80.0000 mg | ORAL_TABLET | Freq: Every day | ORAL | Status: DC

## 2016-07-25 ENCOUNTER — Other Ambulatory Visit: Payer: Self-pay | Admitting: Internal Medicine

## 2016-08-09 ENCOUNTER — Other Ambulatory Visit: Payer: Self-pay | Admitting: Internal Medicine

## 2016-08-23 ENCOUNTER — Other Ambulatory Visit: Payer: Self-pay | Admitting: Gastroenterology

## 2016-08-23 DIAGNOSIS — C220 Liver cell carcinoma: Secondary | ICD-10-CM

## 2016-08-23 DIAGNOSIS — K7460 Unspecified cirrhosis of liver: Secondary | ICD-10-CM

## 2016-09-03 ENCOUNTER — Ambulatory Visit
Admission: RE | Admit: 2016-09-03 | Discharge: 2016-09-03 | Disposition: A | Discharge status: Home / Self Care | Payer: Medicare Other | Source: Ambulatory Visit | Attending: Gastroenterology | Admitting: Gastroenterology

## 2016-09-03 DIAGNOSIS — K7460 Unspecified cirrhosis of liver: Secondary | ICD-10-CM

## 2016-09-03 DIAGNOSIS — C220 Liver cell carcinoma: Secondary | ICD-10-CM

## 2016-09-13 ENCOUNTER — Other Ambulatory Visit: Payer: Self-pay | Admitting: Family Medicine

## 2016-09-13 DIAGNOSIS — Z1231 Encounter for screening mammogram for malignant neoplasm of breast: Secondary | ICD-10-CM

## 2016-10-16 ENCOUNTER — Encounter (HOSPITAL_COMMUNITY): Payer: Self-pay

## 2016-10-16 ENCOUNTER — Emergency Department (HOSPITAL_COMMUNITY)
Admission: EM | Admit: 2016-10-16 | Discharge: 2016-10-16 | Disposition: A | Discharge status: Home / Self Care | Payer: Medicare Other | Attending: Emergency Medicine | Admitting: Emergency Medicine

## 2016-10-16 DIAGNOSIS — D696 Thrombocytopenia, unspecified: Secondary | ICD-10-CM | POA: Insufficient documentation

## 2016-10-16 DIAGNOSIS — Z9114 Patient's other noncompliance with medication regimen: Secondary | ICD-10-CM | POA: Diagnosis not present

## 2016-10-16 DIAGNOSIS — Z79899 Other long term (current) drug therapy: Secondary | ICD-10-CM | POA: Diagnosis not present

## 2016-10-16 DIAGNOSIS — Z794 Long term (current) use of insulin: Secondary | ICD-10-CM | POA: Diagnosis not present

## 2016-10-16 DIAGNOSIS — I25110 Atherosclerotic heart disease of native coronary artery with unstable angina pectoris: Secondary | ICD-10-CM | POA: Diagnosis not present

## 2016-10-16 DIAGNOSIS — Z951 Presence of aortocoronary bypass graft: Secondary | ICD-10-CM | POA: Diagnosis not present

## 2016-10-16 DIAGNOSIS — Z7982 Long term (current) use of aspirin: Secondary | ICD-10-CM | POA: Diagnosis not present

## 2016-10-16 DIAGNOSIS — E860 Dehydration: Secondary | ICD-10-CM | POA: Diagnosis not present

## 2016-10-16 DIAGNOSIS — I252 Old myocardial infarction: Secondary | ICD-10-CM | POA: Diagnosis not present

## 2016-10-16 DIAGNOSIS — D509 Iron deficiency anemia, unspecified: Secondary | ICD-10-CM | POA: Insufficient documentation

## 2016-10-16 DIAGNOSIS — I10 Essential (primary) hypertension: Secondary | ICD-10-CM | POA: Diagnosis not present

## 2016-10-16 DIAGNOSIS — E039 Hypothyroidism, unspecified: Secondary | ICD-10-CM | POA: Diagnosis not present

## 2016-10-16 DIAGNOSIS — R739 Hyperglycemia, unspecified: Secondary | ICD-10-CM

## 2016-10-16 DIAGNOSIS — E1165 Type 2 diabetes mellitus with hyperglycemia: Secondary | ICD-10-CM | POA: Insufficient documentation

## 2016-10-16 LAB — BASIC METABOLIC PANEL
ANION GAP: 6 (ref 5–15)
BUN: 9 mg/dL (ref 6–20)
CALCIUM: 8.6 mg/dL — AB (ref 8.9–10.3)
CHLORIDE: 103 mmol/L (ref 101–111)
CO2: 25 mmol/L (ref 22–32)
Creatinine, Ser: 0.69 mg/dL (ref 0.44–1.00)
GFR calc non Af Amer: >60 mL/min (ref >60)
GLUCOSE: 353 mg/dL — AB (ref 65–99)
POTASSIUM: 4.1 mmol/L (ref 3.5–5.1)
Sodium: 134 mmol/L — ABNORMAL LOW (ref 135–145)

## 2016-10-16 LAB — URINALYSIS, ROUTINE W REFLEX MICROSCOPIC
BILIRUBIN URINE: NEGATIVE
Ketones, ur: NEGATIVE mg/dL
Nitrite: NEGATIVE
PH: 6 (ref 5.0–8.0)
Protein, ur: NEGATIVE mg/dL
SPECIFIC GRAVITY, URINE: 1.046 — AB (ref 1.005–1.030)

## 2016-10-16 LAB — URINE MICROSCOPIC-ADD ON

## 2016-10-16 LAB — CBC
HEMATOCRIT: 33.5 % — AB (ref 36.0–46.0)
HEMOGLOBIN: 10.7 g/dL — AB (ref 12.0–15.0)
MCH: 24.2 pg — AB (ref 26.0–34.0)
MCHC: 31.9 g/dL (ref 30.0–36.0)
MCV: 75.6 fL — AB (ref 78.0–100.0)
Platelets: 64 10*3/uL — ABNORMAL LOW (ref 150–400)
RBC: 4.43 MIL/uL (ref 3.87–5.11)
RDW: 16.9 % — ABNORMAL HIGH (ref 11.5–15.5)
WBC: 3.6 10*3/uL — ABNORMAL LOW (ref 4.0–10.5)

## 2016-10-16 LAB — CBG MONITORING, ED: GLUCOSE-CAPILLARY: 345 mg/dL — AB (ref 65–99)

## 2016-10-16 NOTE — ED Notes (Signed)
POCT CBG resulted 345; Roselyn Reef, RN aware

## 2016-10-16 NOTE — ED Triage Notes (Signed)
Patient sent from doctors office after being told blood sugar greater than 400. Patient complains of fatigue but stopped her po diabetic medication due to side effects she saw on tv. Has been taking insulin as prescribed. Alert and oriented, NAD

## 2016-10-16 NOTE — Discharge Instructions (Signed)
Try to drink 2 L of water each day.  Restart your insulin Janumet, and Invokana, and take them every day as prescribed.  Call your endocrinologist for a follow-up appointment as soon as possible.  It is important to stay on a low carbohydrate diet.  For the anemia take 2 iron tablets (325 mg each) , twice a day for 2 weeks, then once a day for 1 month.  Follow-up with your primary care doctor for a checkup in one or 2 weeks to discuss the various medical conditions.    Call your primary care doctor for a follow-up appointment in one or 2 weeks to address the various medical problems.

## 2016-10-16 NOTE — ED Provider Notes (Signed)
[facility] DEPT Provider Note   CSN: [id number] Arrival date & time: 10/16/16  1303     History   Chief Complaint Chief Complaint  Patient presents with  . Hyperglycemia    HPI Denise Cuevas is a 65 y.o. female.  She presents for evaluation of hyperglycemia. She went to her primary care office today for a routine physical and was found to be hyperglycemic. She was therefore sent here for further evaluation. She states that she takes her Lantus insulin 80 units, sporadically, about 3 times per week. She also has been prescribed in foci no, but stopped it one month ago "because of side effects I saw on TV." She denies fever, chills, nausea, vomiting, dysuria, urinary frequency, hematuria, diarrhea or constipation. She has not had a headache, back pain, dizziness, weakness or paresthesia. There are no other known modifying factors.    HPI  Past Medical History:  Diagnosis Date  . Angina   . Asthma   . Cirrhosis of liver ([place])    08-12-14 one week ago" told has cirrhosis"  . Coronary artery disease    s/p CABG  . Depression   . Diabetes mellitus    type 2  . Edema extremities    consistent edema lower legs, feet, and right quadrant abdominal pain-"goes and comes"  . Fibromyalgia   . GERD (gastroesophageal reflux disease)   . H/O hiatal hernia   . Headache(784.0)    occ. generalized  . Heart murmur   . Hypercholesteremia   . Hypertension   . Hypothyroid   . Myocardial infarction 11/2011   MI x2- [id number] coronary stent(chest pain episode at time)  . PONV (postoperative nausea and vomiting)     Patient Active Problem List   Diagnosis Date Noted  . Chest pain 05/29/2015  . Memory loss 05/29/2015  . Essential hypertension 03/20/2015  . Acute respiratory disease 02/02/2015  . Dyspnea 02/02/2015  . Acute respiratory distress 02/02/2015  . Heart murmur   . Depression   . GERD (gastroesophageal reflux disease)   . Unstable angina pectoris ([place]) 11/25/2011  .  Hypothyroidism   . CAD (coronary artery disease)   . Other and unspecified angina pectoris 11/19/2011  . Type II or unspecified type diabetes mellitus without mention of complication, not stated as uncontrolled 11/19/2011  . Myocardial infarction 11/09/2011    Past Surgical History:  Procedure Laterality Date  . CHOLECYSTECTOMY     open many yrs ago  . COLONOSCOPY WITH PROPOFOL N/A 08/24/2014   Procedure: COLONOSCOPY WITH PROPOFOL;  Surgeon: Arta Silence, MD;  Location: [facility];  Service: Endoscopy;  Laterality: N/A;  . CORONARY ANGIOPLASTY WITH STENT PLACEMENT  05/06/2012  . CORONARY ARTERY BYPASS GRAFT  11/27/2011   Procedure: CORONARY ARTERY BYPASS GRAFTING (CABG);  Surgeon: [name], MD;  Location: [facility];  Service: Open Heart Surgery;  Laterality: N/A;  coronary artery bypass graft on pump times two utilizing left internal mammary artery and right saphenous vein harvested endoscopically   . ESOPHAGOGASTRODUODENOSCOPY (EGD) WITH PROPOFOL N/A 08/24/2014   Procedure: ESOPHAGOGASTRODUODENOSCOPY (EGD) WITH PROPOFOL;  Surgeon: Arta Silence, MD;  Location: [facility];  Service: Endoscopy;  Laterality: N/A;  . KNEE SURGERY Right    scope surgery  . LEFT HEART CATHETERIZATION WITH CORONARY ANGIOGRAM N/A 11/19/2011   Procedure: LEFT HEART CATHETERIZATION WITH CORONARY ANGIOGRAM;  Surgeon: Candee Furbish, MD;  Location: [facility] CATH LAB;  Service: Cardiovascular;  Laterality: N/A;  . LEFT HEART CATHETERIZATION WITH CORONARY ANGIOGRAM  05/06/2012  Procedure: LEFT HEART CATHETERIZATION WITH CORONARY ANGIOGRAM;  Surgeon: Jettie Booze, MD;  Location: [facility] CATH LAB;  Service: Cardiovascular;;  . LEFT HEART CATHETERIZATION WITH CORONARY/GRAFT ANGIOGRAM N/A 03/24/2012   Procedure: LEFT HEART CATHETERIZATION WITH Beatrix Fetters;  Surgeon: Jettie Booze, MD;  Location: [facility] CATH LAB;  Service: Cardiovascular;  Laterality: N/A;  . PERCUTANEOUS CORONARY STENT INTERVENTION (PCI-S)   03/24/2012   Procedure: PERCUTANEOUS CORONARY STENT INTERVENTION (PCI-S);  Surgeon: Jettie Booze, MD;  Location: [facility] CATH LAB;  Service: Cardiovascular;;  . PERCUTANEOUS CORONARY STENT INTERVENTION (PCI-S) Bilateral 05/06/2012   Procedure: PERCUTANEOUS CORONARY STENT INTERVENTION (PCI-S);  Surgeon: Jettie Booze, MD;  Location: [facility] CATH LAB;  Service: Cardiovascular;  Laterality: Bilateral;    OB History    No data available       Home Medications    Prior to Admission medications   Medication Sig Start Date End Date Taking? Authorizing Provider  albuterol (PROVENTIL HFA;VENTOLIN HFA) 108 (90 BASE) MCG/ACT inhaler Inhale 2 puffs into the lungs every 6 (six) hours as needed for wheezing or shortness of breath. 02/04/15   Shanker Kristeen Mans, MD  albuterol (PROVENTIL) (2.5 MG/3ML) 0.083% nebulizer solution Take 3 mLs (2.5 mg total) by nebulization every 2 (two) hours as needed for wheezing. 02/04/15   Jonetta Osgood, MD  aspirin 81 MG tablet Take 81 mg by mouth daily.    Historical Provider, MD  atorvastatin (LIPITOR) 40 MG tablet Take 40 mg by mouth daily.    Historical Provider, MD  DULoxetine (CYMBALTA) 30 MG capsule Take 30 mg by mouth daily with lunch.     Historical Provider, MD  DULoxetine (CYMBALTA) 60 MG capsule Take 60 mg by mouth daily with lunch.     Historical Provider, MD  insulin aspart protamine- aspart (NOVOLOG MIX 70/30) (70-30) 100 UNIT/ML injection Inject 60 Units into the skin 2 (two) times daily with a meal.    Historical Provider, MD  Lancets (FREESTYLE) lancets 1 each by Other route 2 (two) times daily. 04/20/15   Historical Provider, MD  levothyroxine (SYNTHROID, LEVOTHROID) 50 MCG tablet Take 50 mcg by mouth daily before breakfast.    Historical Provider, MD  metoprolol tartrate (LOPRESSOR) 25 MG tablet Take 12.5 mg by mouth 2 (two) times daily.     Historical Provider, MD  nitroGLYCERIN (NITROSTAT) 0.4 MG SL tablet Place 0.4 mg under the tongue every 5 (five)  minutes as needed. For chest pain    Historical Provider, MD  omeprazole-sodium bicarbonate (ZEGERID) 40-1100 MG per capsule Take 1 capsule by mouth daily before breakfast.    Historical Provider, MD  oxycodone (OXY-IR) 5 MG capsule Take 5 mg by mouth every 6 (six) hours as needed.    Historical Provider, MD  sitaGLIPtin-metformin (JANUMET) 50-1000 MG per tablet Take 1 tablet by mouth 2 (two) times daily with a meal.    Historical Provider, MD  valACYclovir (VALTREX) 1000 MG tablet Take 1,000 mg by mouth 2 (two) times daily.    Historical Provider, MD  valsartan (DIOVAN) 80 MG tablet Take 1 tablet (80 mg total) by mouth daily. 05/08/16   Tanda Rockers, MD    Family History Family History  Problem Relation Age of Onset  . Heart disease Father 53    died of MI  . Emphysema Father     smoked  . Peripheral vascular disease Mother 79    bilaterial amputations    Social History Social History  Substance Use Topics  . Smoking status:  Never Smoker  . Smokeless tobacco: Never Used  . Alcohol use No     Allergies   Crestor [rosuvastatin calcium]; Exenatide; and Ace inhibitors   Review of Systems Review of Systems  All other systems reviewed and are negative.    Physical Exam Updated Vital Signs BP 138/61 (BP Location: Right Arm)   Pulse 78   Temp 98.5 F (36.9 C) (Oral)   Resp 15   Ht 5\' 2"  (1.575 m)   Wt 160 lb (72.6 kg)   SpO2 100%   BMI 29.26 kg/m   Physical Exam  Constitutional: She is oriented to person, place, and time. She appears well-developed and well-nourished. No distress.  HENT:  Head: Normocephalic and atraumatic.  Moist mucous membranes. No lesions of tongue, lips, or mucosa.  Eyes: Conjunctivae and EOM are normal. Pupils are equal, round, and reactive to light.  Neck: Normal range of motion and phonation normal. Neck supple.  Cardiovascular: Normal rate and regular rhythm.   Pulmonary/Chest: Effort normal and breath sounds normal. No respiratory  distress. She has no wheezes. She exhibits no tenderness.  Good air movement bilaterally. No rhonchi.  Abdominal: Soft. She exhibits no distension. There is no tenderness. There is no guarding.  Musculoskeletal: Normal range of motion.  Neurological: She is alert and oriented to person, place, and time. She exhibits normal muscle tone.  Skin: Skin is warm and dry.  Psychiatric: She has a normal mood and affect. Her behavior is normal. Judgment and thought content normal.  Nursing note and vitals reviewed.    ED Treatments / Results  Labs (all labs ordered are listed, but only abnormal results are displayed) Labs Reviewed  BASIC METABOLIC PANEL - Abnormal; Notable for the following:       Result Value   Sodium 134 (*)    Glucose, Bld 353 (*)    Calcium 8.6 (*)    All other components within normal limits  CBC - Abnormal; Notable for the following:    WBC 3.6 (*)    Hemoglobin 10.7 (*)    HCT 33.5 (*)    MCV 75.6 (*)    MCH 24.2 (*)    RDW 16.9 (*)    Platelets 64 (*)    All other components within normal limits  URINALYSIS, ROUTINE W REFLEX MICROSCOPIC (NOT AT [facility]) - Abnormal; Notable for the following:    Specific Gravity, Urine 1.046 (*)    Glucose, UA >1000 (*)    Hgb urine dipstick TRACE (*)    Leukocytes, UA SMALL (*)    All other components within normal limits  URINE MICROSCOPIC-ADD ON - Abnormal; Notable for the following:    Squamous Epithelial / LPF 0-5 (*)    Bacteria, UA FEW (*)    All other components within normal limits  CBG MONITORING, ED - Abnormal; Notable for the following:    Glucose-Capillary 345 (*)    All other components within normal limits  URINE CULTURE    EKG  EKG Interpretation None       Radiology No results found.  Procedures Procedures (including critical care time)  Medications Ordered in ED Medications - No data to display   Initial Impression / Assessment and Plan / ED Course  I have reviewed the triage vital signs and  the nursing notes.  Pertinent labs & imaging results that were available during my care of the patient were reviewed by me and considered in my medical decision making (see chart for details).  Clinical  Course as of Oct 17 1703  Wed Oct 16, 2016  1659 High, consistent with dehydration Specific Gravity, Urine: (!) 1.046 [EW]  1659 High Glucose: (!) >1000 [EW]  1700 Slightly low Sodium: (!) 134 [EW]  1700 High, nonfasting Glucose: (!) 353 [EW]  1700 Mild elevation, without clinical symptoms for UTI WBC, UA: 6-30 [EW]  1700 Nonspecific elevation on clean-catch urine. Bacteria, UA: (!) FEW [EW]  1700 Slightly low WBC: (!) 3.6 [EW]  1700 Low Hemoglobin: (!) 10.7 [EW]  1700 Low, consistent with iron deficiency MCV: (!) 75.6 [EW]  1700 Low Platelets: (!) 64 [EW]    Clinical Course User Index [EW] Daleen Bo, MD    Medications - No data to display  Patient Vitals for the past 24 hrs:  BP Temp Temp src Pulse Resp SpO2 Height Weight  10/16/16 1700 138/61 - - 78 15 100 % - -  10/16/16 1554 (!) 147/45 98.5 F (36.9 C) Oral 79 17 99 % - -  10/16/16 1309 131/58 97.7 F (36.5 C) Oral 79 20 100 % 5\' 2"  (1.575 m) 160 lb (72.6 kg)    5:01 PM Reevaluation with update and discussion. After initial assessment and treatment, an updated evaluation reveals No change in clinical status. Findings discussed with patient, and husband, all questions answered. Naziah Portee L    Final Clinical Impressions(s) / ED Diagnoses   Final diagnoses:  Hyperglycemia  Iron deficiency anemia, unspecified iron deficiency anemia type  Noncompliance with medications   Incidental hyperglycemia found at physical exam today. Patient is essentially asymptomatic for hyperglycemia. She does have mild dehydration, without evidence for ketosis. She has incidental anemia, iron deficient and thrombocytopenia, without evidence for active bleeding. She has been noncompliant with recommended treatment for diabetes. Doubt acute  infectious process including UTI, metabolic instability or impending vascular collapse. The patient is comfortable with outpatient treatment, including  replenishment of fluid orally,  and reinitiation of her medications as prescribed.   Nursing Notes Reviewed/ Care Coordinated Applicable Imaging Reviewed Interpretation of Laboratory Data incorporated into ED treatment  The patient appears reasonably screened and/or stabilized for discharge and I doubt any other medical condition or other [facility] requiring further screening, evaluation, or treatment in the ED at this time prior to discharge.  Plan: Home Medications- continue/restart, also, take iron tablets twice a day for 2 weeks, then once a day for 1 month; Home Treatments- drink 2 L of water each day for 1 week.; return here if the recommended treatment, does not improve the symptoms; Recommended follow up- PCP follow-up in one week.   New Prescriptions New Prescriptions   No medications on file     Daleen Bo, MD 10/17/16 1256

## 2016-10-17 ENCOUNTER — Ambulatory Visit
Admission: RE | Admit: 2016-10-17 | Discharge: 2016-10-17 | Disposition: A | Discharge status: Home / Self Care | Payer: Medicare Other | Source: Ambulatory Visit | Attending: Family Medicine | Admitting: Family Medicine

## 2016-10-17 ENCOUNTER — Other Ambulatory Visit: Payer: Self-pay | Admitting: Family

## 2016-10-17 DIAGNOSIS — Z1231 Encounter for screening mammogram for malignant neoplasm of breast: Secondary | ICD-10-CM

## 2016-10-17 DIAGNOSIS — E041 Nontoxic single thyroid nodule: Secondary | ICD-10-CM

## 2016-10-17 LAB — URINE CULTURE

## 2016-10-21 ENCOUNTER — Other Ambulatory Visit: Payer: Self-pay | Admitting: Family

## 2016-10-21 DIAGNOSIS — R928 Other abnormal and inconclusive findings on diagnostic imaging of breast: Secondary | ICD-10-CM

## 2016-10-23 ENCOUNTER — Ambulatory Visit
Admission: RE | Admit: 2016-10-23 | Discharge: 2016-10-23 | Disposition: A | Discharge status: Home / Self Care | Payer: Medicare Other | Source: Ambulatory Visit | Attending: Family | Admitting: Family

## 2016-10-23 ENCOUNTER — Other Ambulatory Visit: Payer: Self-pay | Admitting: Family

## 2016-10-23 DIAGNOSIS — R928 Other abnormal and inconclusive findings on diagnostic imaging of breast: Secondary | ICD-10-CM

## 2016-10-24 ENCOUNTER — Ambulatory Visit
Admission: RE | Admit: 2016-10-24 | Discharge: 2016-10-24 | Disposition: A | Discharge status: Home / Self Care | Payer: Medicare Other | Source: Ambulatory Visit | Attending: Family | Admitting: Family

## 2016-10-24 DIAGNOSIS — E041 Nontoxic single thyroid nodule: Secondary | ICD-10-CM

## 2016-11-19 ENCOUNTER — Emergency Department (HOSPITAL_COMMUNITY): Payer: Medicare Other

## 2016-11-19 ENCOUNTER — Inpatient Hospital Stay (HOSPITAL_COMMUNITY)
Admission: EM | Admit: 2016-11-19 | Discharge: 2016-11-22 | DRG: 291 | Disposition: A | Discharge status: Home / Self Care | Payer: Medicare Other | Attending: Internal Medicine | Admitting: Internal Medicine

## 2016-11-19 ENCOUNTER — Encounter (HOSPITAL_COMMUNITY): Payer: Self-pay | Admitting: *Deleted

## 2016-11-19 DIAGNOSIS — E039 Hypothyroidism, unspecified: Secondary | ICD-10-CM | POA: Diagnosis present

## 2016-11-19 DIAGNOSIS — E1165 Type 2 diabetes mellitus with hyperglycemia: Secondary | ICD-10-CM | POA: Diagnosis present

## 2016-11-19 DIAGNOSIS — E7800 Pure hypercholesterolemia, unspecified: Secondary | ICD-10-CM | POA: Diagnosis present

## 2016-11-19 DIAGNOSIS — Z6831 Body mass index (BMI) 31.0-31.9, adult: Secondary | ICD-10-CM | POA: Diagnosis not present

## 2016-11-19 DIAGNOSIS — D61818 Other pancytopenia: Secondary | ICD-10-CM | POA: Diagnosis present

## 2016-11-19 DIAGNOSIS — I2510 Atherosclerotic heart disease of native coronary artery without angina pectoris: Secondary | ICD-10-CM | POA: Diagnosis not present

## 2016-11-19 DIAGNOSIS — Z7982 Long term (current) use of aspirin: Secondary | ICD-10-CM

## 2016-11-19 DIAGNOSIS — I509 Heart failure, unspecified: Secondary | ICD-10-CM | POA: Diagnosis not present

## 2016-11-19 DIAGNOSIS — I110 Hypertensive heart disease with heart failure: Principal | ICD-10-CM | POA: Diagnosis present

## 2016-11-19 DIAGNOSIS — M797 Fibromyalgia: Secondary | ICD-10-CM | POA: Diagnosis present

## 2016-11-19 DIAGNOSIS — Z825 Family history of asthma and other chronic lower respiratory diseases: Secondary | ICD-10-CM

## 2016-11-19 DIAGNOSIS — Z7951 Long term (current) use of inhaled steroids: Secondary | ICD-10-CM

## 2016-11-19 DIAGNOSIS — Z8241 Family history of sudden cardiac death: Secondary | ICD-10-CM

## 2016-11-19 DIAGNOSIS — I10 Essential (primary) hypertension: Secondary | ICD-10-CM | POA: Diagnosis present

## 2016-11-19 DIAGNOSIS — E43 Unspecified severe protein-calorie malnutrition: Secondary | ICD-10-CM | POA: Diagnosis present

## 2016-11-19 DIAGNOSIS — Z79899 Other long term (current) drug therapy: Secondary | ICD-10-CM

## 2016-11-19 DIAGNOSIS — E8770 Fluid overload, unspecified: Secondary | ICD-10-CM

## 2016-11-19 DIAGNOSIS — K760 Fatty (change of) liver, not elsewhere classified: Secondary | ICD-10-CM | POA: Diagnosis present

## 2016-11-19 DIAGNOSIS — Z79891 Long term (current) use of opiate analgesic: Secondary | ICD-10-CM | POA: Diagnosis not present

## 2016-11-19 DIAGNOSIS — Z794 Long term (current) use of insulin: Secondary | ICD-10-CM

## 2016-11-19 DIAGNOSIS — R0789 Other chest pain: Secondary | ICD-10-CM | POA: Diagnosis present

## 2016-11-19 DIAGNOSIS — K219 Gastro-esophageal reflux disease without esophagitis: Secondary | ICD-10-CM | POA: Diagnosis present

## 2016-11-19 DIAGNOSIS — R0602 Shortness of breath: Secondary | ICD-10-CM

## 2016-11-19 DIAGNOSIS — Z888 Allergy status to other drugs, medicaments and biological substances status: Secondary | ICD-10-CM

## 2016-11-19 DIAGNOSIS — Z955 Presence of coronary angioplasty implant and graft: Secondary | ICD-10-CM

## 2016-11-19 DIAGNOSIS — I350 Nonrheumatic aortic (valve) stenosis: Secondary | ICD-10-CM | POA: Diagnosis present

## 2016-11-19 DIAGNOSIS — K7460 Unspecified cirrhosis of liver: Secondary | ICD-10-CM | POA: Diagnosis present

## 2016-11-19 DIAGNOSIS — R011 Cardiac murmur, unspecified: Secondary | ICD-10-CM | POA: Diagnosis present

## 2016-11-19 DIAGNOSIS — Z8249 Family history of ischemic heart disease and other diseases of the circulatory system: Secondary | ICD-10-CM | POA: Diagnosis not present

## 2016-11-19 DIAGNOSIS — R600 Localized edema: Secondary | ICD-10-CM

## 2016-11-19 DIAGNOSIS — R188 Other ascites: Secondary | ICD-10-CM

## 2016-11-19 DIAGNOSIS — J45909 Unspecified asthma, uncomplicated: Secondary | ICD-10-CM | POA: Diagnosis present

## 2016-11-19 DIAGNOSIS — K7469 Other cirrhosis of liver: Secondary | ICD-10-CM | POA: Diagnosis present

## 2016-11-19 DIAGNOSIS — R262 Difficulty in walking, not elsewhere classified: Secondary | ICD-10-CM

## 2016-11-19 DIAGNOSIS — I5031 Acute diastolic (congestive) heart failure: Secondary | ICD-10-CM | POA: Diagnosis present

## 2016-11-19 DIAGNOSIS — I252 Old myocardial infarction: Secondary | ICD-10-CM | POA: Diagnosis not present

## 2016-11-19 DIAGNOSIS — Z951 Presence of aortocoronary bypass graft: Secondary | ICD-10-CM | POA: Diagnosis not present

## 2016-11-19 DIAGNOSIS — L259 Unspecified contact dermatitis, unspecified cause: Secondary | ICD-10-CM | POA: Diagnosis present

## 2016-11-19 DIAGNOSIS — IMO0002 Reserved for concepts with insufficient information to code with codable children: Secondary | ICD-10-CM | POA: Diagnosis present

## 2016-11-19 LAB — CBC WITH DIFFERENTIAL/PLATELET
Basophils Absolute: 0 10*3/uL (ref 0.0–0.1)
Basophils Relative: 1 %
EOS ABS: 0.1 10*3/uL (ref 0.0–0.7)
Eosinophils Relative: 3 %
HCT: 32.5 % — ABNORMAL LOW (ref 36.0–46.0)
Hemoglobin: 10.3 g/dL — ABNORMAL LOW (ref 12.0–15.0)
Lymphocytes Relative: 47 %
Lymphs Abs: 1.8 10*3/uL (ref 0.7–4.0)
MCH: 24.9 pg — ABNORMAL LOW (ref 26.0–34.0)
MCHC: 31.7 g/dL (ref 30.0–36.0)
MCV: 78.7 fL (ref 78.0–100.0)
MONO ABS: 0.6 10*3/uL (ref 0.1–1.0)
Monocytes Relative: 16 %
NEUTROS PCT: 35 %
Neutro Abs: 1.3 10*3/uL — ABNORMAL LOW (ref 1.7–7.7)
Platelets: 73 10*3/uL — ABNORMAL LOW (ref 150–400)
RBC: 4.13 MIL/uL (ref 3.87–5.11)
RDW: 17.8 % — AB (ref 11.5–15.5)
WBC: 3.8 10*3/uL — ABNORMAL LOW (ref 4.0–10.5)

## 2016-11-19 LAB — GLUCOSE, CAPILLARY: Glucose-Capillary: 222 mg/dL — ABNORMAL HIGH (ref 65–99)

## 2016-11-19 LAB — BASIC METABOLIC PANEL
Anion gap: 7 (ref 5–15)
BUN: 8 mg/dL (ref 6–20)
CHLORIDE: 104 mmol/L (ref 101–111)
CO2: 24 mmol/L (ref 22–32)
CREATININE: 0.64 mg/dL (ref 0.44–1.00)
Calcium: 8.6 mg/dL — ABNORMAL LOW (ref 8.9–10.3)
GFR calc Af Amer: >60 mL/min (ref >60)
GFR calc non Af Amer: >60 mL/min (ref >60)
GLUCOSE: 310 mg/dL — AB (ref 65–99)
Potassium: 3.8 mmol/L (ref 3.5–5.1)
SODIUM: 135 mmol/L (ref 135–145)

## 2016-11-19 LAB — TROPONIN I: Troponin I: <0.03 ng/mL (ref <0.03)

## 2016-11-19 LAB — CBC
HEMATOCRIT: 33.1 % — AB (ref 36.0–46.0)
Hemoglobin: 10.4 g/dL — ABNORMAL LOW (ref 12.0–15.0)
MCH: 24.8 pg — AB (ref 26.0–34.0)
MCHC: 31.4 g/dL (ref 30.0–36.0)
MCV: 79 fL (ref 78.0–100.0)
PLATELETS: 79 10*3/uL — AB (ref 150–400)
RBC: 4.19 MIL/uL (ref 3.87–5.11)
RDW: 18 % — AB (ref 11.5–15.5)
WBC: 4.2 10*3/uL (ref 4.0–10.5)

## 2016-11-19 LAB — I-STAT TROPONIN, ED: Troponin i, poc: 0 ng/mL (ref 0.00–0.08)

## 2016-11-19 LAB — PROTIME-INR
INR: 1.28
Prothrombin Time: 16.1 seconds — ABNORMAL HIGH (ref 11.4–15.2)

## 2016-11-19 LAB — BRAIN NATRIURETIC PEPTIDE: B Natriuretic Peptide: 219.5 pg/mL — ABNORMAL HIGH (ref 0.0–100.0)

## 2016-11-19 MED ORDER — ASPIRIN EC 81 MG PO TBEC
81.0000 mg | DELAYED_RELEASE_TABLET | Freq: Every day | ORAL | Status: DC
  Administered 2016-11-20 – 2016-11-22 (×3): 81 mg via ORAL
  Filled 2016-11-19 (×3): qty 1

## 2016-11-19 MED ORDER — ENSURE ENLIVE PO LIQD
237.0000 mL | Freq: Two times a day (BID) | ORAL | Status: DC
  Administered 2016-11-20: 237 mL via ORAL

## 2016-11-19 MED ORDER — FUROSEMIDE 10 MG/ML IJ SOLN
40.0000 mg | Freq: Once | INTRAMUSCULAR | Status: AC
  Administered 2016-11-19: 40 mg via INTRAVENOUS
  Filled 2016-11-19: qty 4

## 2016-11-19 MED ORDER — MOMETASONE FURO-FORMOTEROL FUM 100-5 MCG/ACT IN AERO
2.0000 | INHALATION_SPRAY | Freq: Two times a day (BID) | RESPIRATORY_TRACT | Status: DC
  Administered 2016-11-19 – 2016-11-21 (×2): 2 via RESPIRATORY_TRACT
  Filled 2016-11-19 (×2): qty 8.8

## 2016-11-19 MED ORDER — ONDANSETRON HCL 4 MG/2ML IJ SOLN: 4.0000 mg | Freq: Four times a day (QID) | INTRAMUSCULAR | Status: DC | PRN

## 2016-11-19 MED ORDER — OXYCODONE HCL 5 MG PO TABS: 5.0000 mg | ORAL_TABLET | Freq: Four times a day (QID) | ORAL | Status: DC | PRN

## 2016-11-19 MED ORDER — GABAPENTIN 100 MG PO CAPS: 100.0000 mg | ORAL_CAPSULE | Freq: Three times a day (TID) | ORAL | Status: DC | PRN

## 2016-11-19 MED ORDER — IRBESARTAN 75 MG PO TABS
75.0000 mg | ORAL_TABLET | Freq: Every day | ORAL | Status: DC
  Administered 2016-11-20 – 2016-11-22 (×3): 75 mg via ORAL
  Filled 2016-11-19 (×3): qty 1

## 2016-11-19 MED ORDER — LEVOTHYROXINE SODIUM 50 MCG PO TABS
50.0000 ug | ORAL_TABLET | Freq: Every day | ORAL | Status: DC
  Administered 2016-11-20 – 2016-11-22 (×3): 50 ug via ORAL
  Filled 2016-11-19 (×3): qty 1

## 2016-11-19 MED ORDER — ENOXAPARIN SODIUM 40 MG/0.4ML ~~LOC~~ SOLN: 40.0000 mg | Freq: Every day | SUBCUTANEOUS | Status: DC

## 2016-11-19 MED ORDER — INSULIN ASPART 100 UNIT/ML ~~LOC~~ SOLN
0.0000 [IU] | Freq: Every day | SUBCUTANEOUS | Status: DC
  Administered 2016-11-19: 2 [IU] via SUBCUTANEOUS
  Administered 2016-11-20 – 2016-11-21 (×2): 3 [IU] via SUBCUTANEOUS

## 2016-11-19 MED ORDER — METOPROLOL TARTRATE 12.5 MG HALF TABLET
12.5000 mg | ORAL_TABLET | Freq: Two times a day (BID) | ORAL | Status: DC
  Administered 2016-11-19 – 2016-11-22 (×6): 12.5 mg via ORAL
  Filled 2016-11-19 (×6): qty 1

## 2016-11-19 MED ORDER — ALBUTEROL SULFATE (2.5 MG/3ML) 0.083% IN NEBU: 2.5000 mg | INHALATION_SOLUTION | RESPIRATORY_TRACT | Status: DC | PRN

## 2016-11-19 MED ORDER — INSULIN ASPART 100 UNIT/ML ~~LOC~~ SOLN
0.0000 [IU] | Freq: Three times a day (TID) | SUBCUTANEOUS | Status: DC
  Administered 2016-11-20: 5 [IU] via SUBCUTANEOUS
  Administered 2016-11-20: 15 [IU] via SUBCUTANEOUS
  Administered 2016-11-20: 3 [IU] via SUBCUTANEOUS
  Administered 2016-11-21: 5 [IU] via SUBCUTANEOUS
  Administered 2016-11-21: 15 [IU] via SUBCUTANEOUS
  Administered 2016-11-22: 3 [IU] via SUBCUTANEOUS
  Administered 2016-11-22: 11 [IU] via SUBCUTANEOUS

## 2016-11-19 MED ORDER — INSULIN GLARGINE 100 UNIT/ML ~~LOC~~ SOLN
100.0000 [IU] | Freq: Every day | SUBCUTANEOUS | Status: DC
  Filled 2016-11-19: qty 1

## 2016-11-19 MED ORDER — ATORVASTATIN CALCIUM 40 MG PO TABS
40.0000 mg | ORAL_TABLET | Freq: Every day | ORAL | Status: DC
  Administered 2016-11-20 – 2016-11-22 (×3): 40 mg via ORAL
  Filled 2016-11-19 (×3): qty 1

## 2016-11-19 MED ORDER — FUROSEMIDE 10 MG/ML IJ SOLN
80.0000 mg | Freq: Two times a day (BID) | INTRAMUSCULAR | Status: DC
  Administered 2016-11-20 – 2016-11-22 (×5): 80 mg via INTRAVENOUS
  Filled 2016-11-19 (×5): qty 8

## 2016-11-19 MED ORDER — SODIUM CHLORIDE 0.9 % IV SOLN: 250.0000 mL | INTRAVENOUS | Status: DC | PRN

## 2016-11-19 MED ORDER — PANTOPRAZOLE SODIUM 40 MG PO TBEC
80.0000 mg | DELAYED_RELEASE_TABLET | Freq: Every day | ORAL | Status: DC
  Administered 2016-11-20 – 2016-11-22 (×3): 80 mg via ORAL
  Filled 2016-11-19 (×3): qty 2

## 2016-11-19 MED ORDER — ACETAMINOPHEN 325 MG PO TABS: 650.0000 mg | ORAL_TABLET | ORAL | Status: DC | PRN

## 2016-11-19 MED ORDER — SODIUM CHLORIDE 0.9% FLUSH: 3.0000 mL | INTRAVENOUS | Status: DC | PRN

## 2016-11-19 MED ORDER — VALACYCLOVIR HCL 500 MG PO TABS
1000.0000 mg | ORAL_TABLET | Freq: Two times a day (BID) | ORAL | Status: DC
  Administered 2016-11-19 – 2016-11-22 (×6): 1000 mg via ORAL
  Filled 2016-11-19 (×6): qty 2

## 2016-11-19 MED ORDER — DULOXETINE HCL 30 MG PO CPEP
90.0000 mg | ORAL_CAPSULE | Freq: Every day | ORAL | Status: DC
  Administered 2016-11-20 – 2016-11-22 (×3): 90 mg via ORAL
  Filled 2016-11-19 (×3): qty 3

## 2016-11-19 MED ORDER — SODIUM CHLORIDE 0.9% FLUSH
3.0000 mL | Freq: Two times a day (BID) | INTRAVENOUS | Status: DC
  Administered 2016-11-19 – 2016-11-22 (×6): 3 mL via INTRAVENOUS

## 2016-11-19 MED ORDER — DULOXETINE HCL 60 MG PO CPEP: 60.0000 mg | ORAL_CAPSULE | Freq: Every day | ORAL | Status: DC

## 2016-11-19 NOTE — ED Triage Notes (Signed)
Pt reports mid chest heaviness for 4 days. Has sob and leg swelling. ekg done at triage and airway is intact.

## 2016-11-19 NOTE — ED Notes (Signed)
Patient states she just noticed swelling in ankles and feet is getting worse and also has rash to same.  Ihor Austin RN notified.

## 2016-11-19 NOTE — Progress Notes (Signed)
New Admission Note:   Arrival Method: Bed Mental Orientation: AOX4 Telemetry:3E-12 Assessment: Completed Skin: Intact. Rash on Right ankle, Bilateral pitting edema 1+ IV:  Left AC Pain:0/10 Tubes: None Safety Measures: Safety Fall Prevention Plan has been discussed  Admission:  [facility] [place] Orientation: Patient has been orientated to the room, unit and staff.  Family: husband at bedside  Orders to be reviewed and implemented. Will continue to monitor the patient. Call light has been placed within reach and bed alarm has been activated.   Alyvia Derk, RN Phone: [phone]

## 2016-11-19 NOTE — Consult Note (Addendum)
CARDIOLOGY CONSULT NOTE   Referring Physician: Loma Boston Primary Physician: None Primary Cardiologist: [name] Reason for Consultation: SOB and cp HPI: Denise Cuevas is a 65 yo F with a h/o MI x 3 (2013) s/p CABG (LIMA-LAD, SVG-OM, 2012) and then PCI, HTN, HL, DM, mild AS, cirrhosis thought to be due to NAFLD, and asthma who presents with one week of worsening SOB and cp.   Over the past 6 months, Ms. Diglio has lost 40 lbs, some confusion re: involuntarily ("I have no appetite") vs. voluntary ("I'm so pleased I lost 40 lbs"). Over the past two weeks, however, she states she has gained 17 lbs, accompanied by SOB, severe DOE, position cp, and lower extremity swelling. The cp she has experienced, which is sharp and positional, has been very different than her prior anginal equivalent which is dull cp that radiated to her jaw and both arms. She denies any prior such episodes but notably has been seen in the ER several times for presumed asthma.   She has had a non-productive cough x 1 week, denies fevers, sick contacts.   She denies any dietary indiscretions beyond Chex Mix.  She is not on any Lasix p.o. at home.  CAD history is CABG (LIMA-LAD, SVG-OM, 2012) c/b 90% SVG to the OM at the distal insertion s/p drug-eluting stent to the proximal circumflex 03/24/12, DES to distal LAD in 05/06/12.  Last TTE in 2016 with LVEF 65%, mild AS. Last SPECT in 05/2015 which demonstrated only prior infarction.  Review of Systems:     Cardiac Review of Systems: {Y] = yes [ ]  = no  Chest Pain [Y ]  Resting SOB [Y] Exertional SOB  [Y]  Orthopnea [  ]   Pedal Edema [Y]    Palpitations [  ] Syncope  [  ]   Presyncope [   ]  General Review of Systems: [Y] = yes [  ]=no Constitional: recent weight change [Y]; anorexia [Y]; fatigue [Y]; nausea [  ]; night sweats [  ]; fever [  ]; or chills [  ];                                                                     Eyes : blurred vision [  ]; diplopia [   ]; vision  changes [  ];  Amaurosis fugax[  ]; Resp: cough [Y];  wheezing[  ];  hemoptysis[  ];  PND [  ];  GI:  gallstones[  ], vomiting[  ];  dysphagia[  ]; melena[  ];  hematochezia [  ]; heartburn[  ];   GU: kidney stones [  ]; hematuria[  ];   dysuria [  ];  nocturia[  ]; incontinence [  ];             Skin: rash, swelling[Y];, hair loss[  ];  peripheral edema[  ];  or itching[  ]; Musculosketetal: myalgias[  ];  joint swelling[  ];  joint erythema[  ];  joint pain[  ];  back pain[  ];  Heme/Lymph: bruising[  ];  bleeding[  ];  anemia[  ];  Neuro: TIA[  ];  headaches[  ];  stroke[  ];  vertigo[  ];  seizures[  ];  paresthesias[  ];  difficulty walking[ ] ;  Psych:depression[  ]; anxiety[  ];  Endocrine: diabetes[  ];  thyroid dysfunction[  ];  Other:  Past Medical History:  Diagnosis Date  . Angina   . Asthma   . Cirrhosis of liver ([place]) 08/2014   idiopathic  . Coronary artery disease    s/p CABG  . Depression   . Diabetes mellitus    type 2  . Edema extremities    consistent edema lower legs, feet, and right quadrant abdominal pain-"goes and comes"  . Fibromyalgia   . GERD (gastroesophageal reflux disease)   . H/O hiatal hernia   . Headache(784.0)    occ. generalized  . Heart murmur   . Hypercholesteremia   . Hypertension   . Hypothyroid   . Myocardial infarction 11/2011   MI x2- [id number] coronary stent(chest pain episode at time)  . PONV (postoperative nausea and vomiting)     (Not in a hospital admission)  Infusions:   Allergies  Allergen Reactions  . Exenatide Nausea And Vomiting  . Ace Inhibitors Other (See Comments)    pseudoasthma    Social History   Social History  . Marital status: Married    Spouse name: N/A  . Number of children: N/A  . Years of education: N/A   Occupational History  . Retired Doctor, hospital    Social History Main Topics  . Smoking status: Never Smoker  . Smokeless tobacco: Never Used  . Alcohol use No  . Drug use: No  . Sexual  activity: Yes   Other Topics Concern  . Not on file   Social History Narrative   Retired Naval architect          Family History  Problem Relation Age of Onset  . Heart disease Father 76    died of MI  . Emphysema Father     smoked  . Peripheral vascular disease Mother 40    bilaterial amputations    PHYSICAL EXAM: Vitals:   11/19/16 2030 11/19/16 2100  BP: 130/66 118/69  Pulse: 75 76  Resp:    Temp:      No intake or output data in the 24 hours ending 11/19/16 2129  General:  Well appearing. No respiratory difficulty.  HEENT: JVP at the angle of the jaw when sitting upright, +HJR Neck: supple. No JVD, Carotids 2+ bilaterally, not parvus e tardus. No lymphadenopathy or thryomegaly appreciated. Cor: PMI nondisplaced. Regular rate & rhythm. 3/6 SEM that radiates to carotids, does not obliterate S2.  Lungs: Clear, good breath sounds, no wheezing. Abdomen: Soft, Nontender, Nondistended, Obese, +fluid wave. No hepatosplenomegaly. No bruits or masses. Good bowel sounds. Prior chole scar. Extremities: No cyanosis, clubbing. 2+ pitting edema bilaterally, maculopapapular rash over R ankle.  Neuro: alert & oriented x 3, cranial nerves grossly intact. moves all 4 extremities w/o difficulty. Affect pleasant.  ECG: NSR, normal EKG, no ST-T changes or TWI  Results for orders placed or performed during the hospital encounter of 11/19/16 (from the past 24 hour(s))  Basic metabolic panel     Status: Abnormal   Collection Time: 11/19/16  3:03 PM  Result Value Ref Range   Sodium 135 135 - 145 mmol/L   Potassium 3.8 3.5 - 5.1 mmol/L   Chloride 104 101 - 111 mmol/L   CO2 24 22 - 32 mmol/L   Glucose, Bld 310 (H) 65 - 99 mg/dL   BUN 8 6 - 20 mg/dL   Creatinine, Ser 0.64  0.44 - 1.00 mg/dL   Calcium 8.6 (L) 8.9 - 10.3 mg/dL   GFR calc non Af Amer >60 >60 mL/min   GFR calc Af Amer >60 >60 mL/min   Anion gap 7 5 - 15  CBC     Status: Abnormal   Collection Time: 11/19/16  3:03 PM   Result Value Ref Range   WBC 4.2 4.0 - 10.5 K/uL   RBC 4.19 3.87 - 5.11 MIL/uL   Hemoglobin 10.4 (L) 12.0 - 15.0 g/dL   HCT 33.1 (L) 36.0 - 46.0 %   MCV 79.0 78.0 - 100.0 fL   MCH 24.8 (L) 26.0 - 34.0 pg   MCHC 31.4 30.0 - 36.0 g/dL   RDW 18.0 (H) 11.5 - 15.5 %   Platelets 79 (L) 150 - 400 K/uL  I-stat troponin, ED     Status: None   Collection Time: 11/19/16  3:13 PM  Result Value Ref Range   Troponin i, poc 0.00 0.00 - 0.08 ng/mL   Comment 3          Brain natriuretic peptide     Status: Abnormal   Collection Time: 11/19/16  8:24 PM  Result Value Ref Range   B Natriuretic Peptide 219.5 (H) 0.0 - 100.0 pg/mL   Dg Chest 2 View  Result Date: 11/19/2016 CLINICAL DATA:  Initial evaluation for acute chest pain, shortness of breath. EXAM: CHEST  2 VIEW COMPARISON:  Prior radiograph from 02/02/2015. FINDINGS: Median sternotomy wires underlying CABG markers noted. Cardiac and mediastinal silhouettes are stable. Lungs are hypoinflated. Bilateral pleural effusions with associated bibasilar opacities, likely atelectasis. No overt pulmonary edema. No other focal infiltrates. No pneumothorax. No acute osseous abnormality. IMPRESSION: 1. Small bilateral pleural effusions, right slightly larger than left. Associated mild bibasilar opacities favored to reflect atelectasis. 2. Sequelae of prior CABG. Electronically Signed   By: Jeannine Boga M.D.   On: 11/19/2016 15:37   TTE 2/16:  - Left ventricle: The cavity size was normal. Wall thickness was normal. Systolic function was normal. The estimated ejection fraction was in the range of 60% to 65%. - Aortic valve: AV is thckened, calcified with mildly restricted motion. Peak and mean gradients through the valve are 35 and 19 mm Hg respectively consistent with mild AS. There was trivial regurgitation. - Mitral valve: There was mild regurgitation. - Left atrium: The atrium was mildly dilated.  SPECT 6/16:  Defect 1: There is a  medium defect of moderate severity present in the basal inferolateral location.  Defect 2: There is a small defect of moderate severity present in the apical lateral and apex location.  Findings consistent with prior myocardial infarction.  This is an intermediate risk study.  The left ventricular ejection fraction is normal (55-65%).  Abnormal study. Old infarction noted, no ischemia  ASSESSMENT: 65 yo F with a h/o MI x 3 (2013) s/p CABG (LIMA-LAD, SVG-LCx, 2012) and then PCI, HTN, HL, DM, mild AS, cirrhosis, and asthma who presents with one week of worsening SOB, edema, and atypical cp which is secondary to volume overload. She has R>L symptoms. Probable acute diastolic heart failure versus worsening cirrhosis.   PLAN/DISCUSSION: #) Volume overload - 17 lb weight gain over the past 2 weeks, this probably represents acute diastolic heart failure, may also be a contribution from cirrhosis.  Dx: - CBC, Chem 7, TSH  - LFTs, INR to assess contribution cirrhosis - EKG - TTE, reassess LVEF, AS - Ischemic assessment; SPECT (if LVEF preserved) vs. LHC (  if LVEF depressed) prior to discharge.  Tx: - Lasix IV  - Further therapy to be determined based on etiology, LVEF  #) CAD Dx: - 1 week of atypical cp; only needs one neg Trop - EKG no changes - Ischemic assessment as above prior to discharge Tx: - Continue ASA, Metoprolol, Statin  #) HTN Tx: - Continue Metoprolol and Valsartan   #) DM - per primary team  #) Rash on LE - not c/w venous stasis changes, arterial ulcer, defer work-up to primary team.  #) 40 lbs weight loss over the past 6 months, confusion regarding involuntary vs. voluntary - Age appropriate Ca screening - Defer further workup to primary team  Allyson Sabal, MD

## 2016-11-19 NOTE — ED Notes (Signed)
Hospitalist at bedside 

## 2016-11-19 NOTE — H&P (Signed)
History and Physical    Denise Cuevas [id number] DOB: 20-Sep-1951 DOA: 11/19/2016  PCP: Antony Blackbird, MD Consultants:  Etter Sjogren - cardiology; Buddy Duty - endocrinology; Gerhardt - surgery; Outlaw - GI Patient coming from: home - lives with husband and adult (autistic) daughter; Donald Prose: husband, [phone]  Chief Complaint: LE edema and SOB  HPI: Denise Cuevas is a 65 y.o. female with medical history significant of CAD s/p CABG, idiopathic cirrhosis of the liver, hypothyroidism, HTN, HLD, fibromyalgia, and DM presenting with new-onset CHF.  Patient wasn't sure if she was having a heart attack - has had several.  Symptoms for about a week.  Can't breathe.  Slight non-productive cough.  Severe chest pain and SOB if she tries to bend over.  Minimal ability to take many steps - "I just give out."  Lost 40 pounds (intentional), but gained 17 in the last few weeks.  +LE edema, mild erythema on feet and ankles, maybe over the last few weeks.  New LLE rash since arriving in the ER.  Substernal chest pressure, radiating up into her neck (with heart attacks, it was across her chest and down her arms as well as in her back, so this was different).  Constant pressure for about a week.  No change in chest pressure with exertion other than bending over.  ED Course: Per Pa-C [place]: Patient seen and examined. Work-up initiated. Medications ordered. Discussed with Dr. Roderic Palau. I have spoken with cardiology who will see and consult. Will admit to medicine service.  Vital signs reviewed and are as follows:  BP 135/61 (BP Location: Right Arm)  Pulse 74  Temp 97.6 F (36.4 C) (Oral)  Resp 20  Ht 5\' 2"  (1.575 m)  Wt 80.7 kg  SpO2 100%  BMI 32.54 kg/m  8:44 PM Cardiology has seen.  Dr. Lorin Mercy to admit. Pt updated.    Review of Systems: As per HPI; otherwise 10 point review of systems reviewed and negative.   Ambulatory Status: ambulates without assistance  Past Medical History:  Diagnosis Date  . Angina     . Asthma   . Cirrhosis of liver ([place]) 08/2014   idiopathic  . Coronary artery disease    s/p CABG  . Depression   . Diabetes mellitus    type 2  . Edema extremities    consistent edema lower legs, feet, and right quadrant abdominal pain-"goes and comes"  . Fibromyalgia   . GERD (gastroesophageal reflux disease)   . H/O hiatal hernia   . Headache(784.0)    occ. generalized  . Heart murmur   . Hypercholesteremia   . Hypertension   . Hypothyroid   . Myocardial infarction 11/2011   MI x2- [id number] coronary stent(chest pain episode at time)  . PONV (postoperative nausea and vomiting)     Past Surgical History:  Procedure Laterality Date  . CHOLECYSTECTOMY     open many yrs ago  . COLONOSCOPY WITH PROPOFOL N/A 08/24/2014   Procedure: COLONOSCOPY WITH PROPOFOL;  Surgeon: Arta Silence, MD;  Location: [facility];  Service: Endoscopy;  Laterality: N/A;  . CORONARY ANGIOPLASTY WITH STENT PLACEMENT  05/06/2012  . CORONARY ARTERY BYPASS GRAFT  11/27/2011   Procedure: CORONARY ARTERY BYPASS GRAFTING (CABG);  Surgeon: [name], MD;  Location: [place];  Service: Open Heart Surgery;  Laterality: N/A;  coronary artery bypass graft on pump times two utilizing left internal mammary artery and right saphenous vein harvested endoscopically   . ESOPHAGOGASTRODUODENOSCOPY (EGD) WITH PROPOFOL N/A 08/24/2014  Procedure: ESOPHAGOGASTRODUODENOSCOPY (EGD) WITH PROPOFOL;  Surgeon: Arta Silence, MD;  Location: [facility];  Service: Endoscopy;  Laterality: N/A;  . KNEE SURGERY Right    scope surgery  . LEFT HEART CATHETERIZATION WITH CORONARY ANGIOGRAM N/A 11/19/2011   Procedure: LEFT HEART CATHETERIZATION WITH CORONARY ANGIOGRAM;  Surgeon: Candee Furbish, MD;  Location: [facility] CATH LAB;  Service: Cardiovascular;  Laterality: N/A;  . LEFT HEART CATHETERIZATION WITH CORONARY ANGIOGRAM  05/06/2012   Procedure: LEFT HEART CATHETERIZATION WITH CORONARY ANGIOGRAM;  Surgeon: Jettie Booze, MD;   Location: [facility] CATH LAB;  Service: Cardiovascular;;  . LEFT HEART CATHETERIZATION WITH CORONARY/GRAFT ANGIOGRAM N/A 03/24/2012   Procedure: LEFT HEART CATHETERIZATION WITH Beatrix Fetters;  Surgeon: Jettie Booze, MD;  Location: [facility] CATH LAB;  Service: Cardiovascular;  Laterality: N/A;  . PERCUTANEOUS CORONARY STENT INTERVENTION (PCI-S)  03/24/2012   Procedure: PERCUTANEOUS CORONARY STENT INTERVENTION (PCI-S);  Surgeon: Jettie Booze, MD;  Location[facility] C[facility] LAB;  Service: Cardiovascular;;  . PERCUTANEOUS CORONARY STENT INTERVENTION (PCI-S) Bilateral 05/06/2012   Procedure: PERCUTANEOUS CORONARY STENT INTERVENTION (PCI-S);  Surgeon: Jettie Booze, MD;  Location: [facility] CATH LAB;  Service: Cardiovascular;  Laterality: Bilateral;    Social History   Social History  . Marital status: Married    Spouse name: N/A  . Number of children: N/A  . Years of education: N/A   Occupational History  . Retired Doctor, hospital    Social History Main Topics  . Smoking status: Never Smoker  . Smokeless tobacco: Never Used  . Alcohol use No  . Drug use: No  . Sexual activity: Yes   Other Topics Concern  . Not on file   Social History Narrative   Retired Naval architect          Allergies  Allergen Reactions  . Exenatide Nausea And Vomiting  . Ace Inhibitors Other (See Comments)    pseudoasthma    Family History  Problem Relation Age of Onset  . Heart disease Father 18    died of MI  . Emphysema Father     smoked  . Peripheral vascular disease Mother 75    bilaterial amputations    Prior to Admission medications   Medication Sig Start Date End Date Taking? Authorizing Provider  albuterol (PROVENTIL HFA;VENTOLIN HFA) 108 (90 BASE) MCG/ACT inhaler Inhale 2 puffs into the lungs every 6 (six) hours as needed for wheezing or shortness of breath. 02/04/15  Yes Shanker Kristeen Mans, MD  albuterol (PROVENTIL) (2.5 MG/3ML) 0.083% nebulizer solution Take 3 mLs (2.5 mg total) by  nebulization every 2 (two) hours as needed for wheezing. 02/04/15  Yes Shanker Kristeen Mans, MD  aspirin 81 MG tablet Take 81 mg by mouth daily.   Yes Historical Provider, MD  atorvastatin (LIPITOR) 40 MG tablet Take 40 mg by mouth daily.   Yes Historical Provider, MD  budesonide-formoterol (SYMBICORT) 80-4.5 MCG/ACT inhaler Inhale 2 puffs into the lungs 2 (two) times daily.   Yes Historical Provider, MD  canagliflozin (INVOKANA) 100 MG TABS tablet Take 100 mg by mouth daily before breakfast.   Yes Historical Provider, MD  DULoxetine (CYMBALTA) 30 MG capsule Take 30 mg by mouth daily with lunch. In conjunction with one 60 mg capsule to equal a total of 90 milligrams   Yes Historical Provider, MD  DULoxetine (CYMBALTA) 60 MG capsule Take 60 mg by mouth daily with lunch. In conjunction with one 30 mg capsule to equal a total of 90 milligrams   Yes Historical Provider, MD  Insulin Glargine (LANTUS SOLOSTAR) 100 UNIT/ML Solostar Pen Inject 100 Units into the skin daily before breakfast.    Yes Historical Provider, MD  levothyroxine (SYNTHROID, LEVOTHROID) 50 MCG tablet Take 50 mcg by mouth daily before breakfast.   Yes Historical Provider, MD  metoprolol tartrate (LOPRESSOR) 25 MG tablet Take 12.5 mg by mouth 2 (two) times daily.    Yes Historical Provider, MD  nitroGLYCERIN (NITROSTAT) 0.4 MG SL tablet Place 0.4 mg under the tongue every 5 (five) minutes as needed. For chest pain   Yes Historical Provider, MD  omeprazole-sodium bicarbonate (ZEGERID) 40-1100 MG per capsule Take 1 capsule by mouth daily before breakfast.   Yes Historical Provider, MD  sitaGLIPtin-metformin (JANUMET) 50-1000 MG per tablet Take 1 tablet by mouth 2 (two) times daily with a meal.   Yes Historical Provider, MD  SSD 1 % cream Apply 1 application topically daily as needed (for irritation).  10/02/16  Yes Historical Provider, MD  valsartan (DIOVAN) 80 MG tablet Take 1 tablet (80 mg total) by mouth daily. 05/08/16  Yes Tanda Rockers, MD   gabapentin (NEURONTIN) 100 MG capsule Take 100-300 mg by mouth 3 (three) times daily as needed (FOR BURNING PAIN).  09/04/16   Historical Provider, MD  oxycodone (OXY-IR) 5 MG capsule Take 5 mg by mouth every 6 (six) hours as needed for pain.     Historical Provider, MD  valACYclovir (VALTREX) 1000 MG tablet Take 1,000 mg by mouth 2 (two) times daily.    Historical Provider, MD    Physical Exam: Vitals:   11/19/16 2030 11/19/16 2100 11/19/16 2129 11/19/16 2200  BP: 130/66 118/69 118/55 131/60  Pulse: 75 76 77 77  Resp:   16 18  Temp:    98.1 F (36.7 C)  TempSrc:    Oral  SpO2: 99% 100% 100% 100%  Weight:    78.8 kg (173 lb 12.8 oz)  Height:    5\' 2"  (1.575 m)     General: Appears calm and comfortable and is NAD Eyes:  PERRL, EOMI, normal lids, iris ENT:  grossly normal hearing, lips & tongue, mmm Neck:  no LAD, masses or thyromegaly Cardiovascular:  RRR, [id number] systolic murmur, no r/g. 2+ pitting LE edema.  Respiratory:  CTA bilaterally, no w/r/r. Normal respiratory effort. Abdomen:  soft, ntnd, NABS Skin:  Erythema of right medial lower leg that looks c/w stasis dermatitis Musculoskeletal:  grossly normal tone BUE/BLE, good ROM, no bony abnormality Psychiatric:  grossly normal mood and affect, speech fluent and appropriate, AOx3 Neurologic:  CN 2-12 grossly intact, moves all extremities in coordinated fashion, sensation intact  Labs on Admission: I have personally reviewed following labs and imaging studies  CBC:  Recent Labs Lab 11/19/16 1503 11/19/16 2252  WBC 4.2 3.8*  NEUTROABS  --  1.3*  HGB 10.4* 10.3*  HCT 33.1* 32.5*  MCV 79.0 78.7  PLT 79* 73*   Basic Metabolic Panel:  Recent Labs Lab 11/19/16 1503  NA 135  K 3.8  CL 104  CO2 24  GLUCOSE 310*  BUN 8  CREATININE 0.64  CALCIUM 8.6*   GFR: Estimated Creatinine Clearance: 70 mL/min (by C-G formula based on SCr of 0.64 mg/dL). Liver Function Tests:  Recent Labs Lab 11/19/16 2252  AST 78*  ALT  39  ALKPHOS 116  BILITOT 1.7*  PROT 5.2*  ALBUMIN 2.5*   No results for input(s): LIPASE, AMYLASE in the last 168 hours. No results for input(s): AMMONIA in the last 168 hours.  Coagulation Profile:  Recent Labs Lab 11/19/16 2252  INR 1.28   Cardiac Enzymes:  Recent Labs Lab 11/19/16 2023 11/19/16 2252  TROPONINI <0.03 <0.03   BNP (last 3 results) No results for input(s): PROBNP in the last 8760 hours. HbA1C: No results for input(s): HGBA1C in the last 72 hours. CBG:  Recent Labs Lab 11/19/16 2151  GLUCAP 222*   Lipid Profile: No results for input(s): CHOL, HDL, LDLCALC, TRIG, CHOLHDL, LDLDIRECT in the last 72 hours. Thyroid Function Tests:  Recent Labs  11/19/16 2252  TSH 3.084   Anemia Panel: No results for input(s): VITAMINB12, FOLATE, FERRITIN, TIBC, IRON, RETICCTPCT in the last 72 hours. Urine analysis:    Component Value Date/Time   COLORURINE YELLOW 10/16/2016 [place] 10/16/2016 1326   LABSPEC 1.046 (H) 10/16/2016 1326   PHURINE 6.0 10/16/2016 1326   GLUCOSEU >1000 (A) 10/16/2016 1326   HGBUR TRACE (A) 10/16/2016 1326   BILIRUBINUR NEGATIVE 10/16/2016 1326   KETONESUR NEGATIVE 10/16/2016 1326   PROTEINUR NEGATIVE 10/16/2016 1326   UROBILINOGEN 1.0 02/02/2015 1539   NITRITE NEGATIVE 10/16/2016 1326   LEUKOCYTESUR SMALL (A) 10/16/2016 1326    Creatinine Clearance: Estimated Creatinine Clearance: 70 mL/min (by C-G formula based on SCr of 0.64 mg/dL).  Sepsis Labs: @LABRCNTIP (procalcitonin:4,lacticidven:4) )No results found for this or any previous visit (from the past 240 hour(s)).   Radiological Exams on Admission: Dg Chest 2 View  Result Date: 11/19/2016 CLINICAL DATA:  Initial evaluation for acute chest pain, shortness of breath. EXAM: CHEST  2 VIEW COMPARISON:  Prior radiograph from 02/02/2015. FINDINGS: Median sternotomy wires underlying CABG markers noted. Cardiac and mediastinal silhouettes are stable. Lungs are  hypoinflated. Bilateral pleural effusions with associated bibasilar opacities, likely atelectasis. No overt pulmonary edema. No other focal infiltrates. No pneumothorax. No acute osseous abnormality. IMPRESSION: 1. Small bilateral pleural effusions, right slightly larger than left. Associated mild bibasilar opacities favored to reflect atelectasis. 2. Sequelae of prior CABG. Electronically Signed   By: Jeannine Boga M.D.   On: 11/19/2016 15:37    EKG: Independently reviewed.  NSR with rate 71; nonspecific ST changes with no evidence of acute ischemia  Assessment/Plan Principal Problem:   Congestive heart failure (HCC) Active Problems:   Type 2 diabetes mellitus, uncontrolled (HCC)   Hypothyroidism   Heart murmur   Essential hypertension   Cirrhosis of liver not due to alcohol (HCC)   Pancytopenia (HCC)   Protein-calorie malnutrition, severe (HCC)   CHF -Patient without known history of CHF (last Echo in 2/16 with preserved EF) presenting with typical symptoms of CHF exacerbation -CXR consistent with pulmonary edema but not read as such -Normal (slightly low) WBC count, no infectious symptoms; will not give antibiotics at this time -Elevated BNP, consistent with prior in 2/16 -Will admit with telemetry -Will request echocardiogram -Will continue ASA -Will continue valsartan -Will continue Lopressor - with acute CHF exacerbation, it would also be reasonable to hold this medication -Will continue Lipitor 40 mg -CHF order set utilized -Cardiology consulted, patient seen by fellow -Plan for stress testing vs. Cath prior to discharge for further risk stratification -Was given Lasix 40 mg x 1 in ER and will continue with 80 mg IV BID for now -Continue  O2 for now if needed -Normal kidney function at this time, will follow -Repeat EKG in AM -Will r/o with serial troponins although doubt ACS based on symptoms  DM -Poor control, admitting glucose 310 -Hold Invokana and Janumet -  which do not  appear to be working well anyway -Last A1c was in 2/16 and was 7.9 -Will repeat A1c -Patient is already taking Lantus 100 units daily -Will change to 50 units BID, as sometimes spilt dosing can improve glycemic control -Cover with SSI  Hypothyroidism -Normal TSH -Continue Synthroid at current dosing  Cirrhosis -Appears to be relatively stable -No intervention for now  Pancytopenia -Stable -Possibly related to cirrhosis -May benefit from heme consult -No Lovenox  Malnutrition -Albumin 2.5 -Severe protein-calorie malnutrition -Will request nutrition consult   DVT prophylaxis: SCDs Code Status:  Full - confirmed with patient/family Family Communication: Husband present throughout evaluation Disposition Plan:  Home once clinically improved Consults called: Cardiology, Nutrition  Admission status: Admit - It is my clinical opinion that admission to INPATIENT is reasonable and necessary because this patient will require at least 2 midnights in the hospital to treat this condition based on the medical complexity of the problems presented.  Given the aforementioned information, the predictability of an adverse outcome is felt to be significant.    Karmen Bongo MD [facility]  If 7PM-7AM, please contact night-coverage [email or web address] Password [email or web address]  11/20/2016, 2:14 AM

## 2016-11-19 NOTE — ED Notes (Signed)
Cardiology at bedside pt appears comfortable

## 2016-11-19 NOTE — ED Provider Notes (Signed)
[facility] DEPT Provider Note   CSN: [id number] Arrival date & time: 11/19/16  1453     History   Chief Complaint Chief Complaint  Patient presents with  . Chest Pain  . Shortness of Breath    HPI Denise Cuevas is a 65 y.o. female.  Patient with history of CABG, stent 1 (2012) -- presents with complaint of one week of worsening shortness of breath, lower extremity swelling, mid chest heaviness. Shortness of breath is much worse with activity. Patient is unable to perform routine activities due to extreme shortness of breath. She weighs herself at home and feels that she has gained approximately 17 pounds over the past 1-2 weeks. Echocardiogram performed 01/2015 showed mild aortic stenosis and a normal ejection fraction. Patient does not have a diagnosis of congestive heart failure. She sleeps propped up on 2 pillows. She does not wake up in the middle the night gasping for air. She states that she gets short of breath even transferring from a chair to the bed. She went to the [facility] several days ago 'and I felt like I was dying when I got to my seat'. No fevers or URI symptoms. The onset of this condition was acute. The course is worsening. Aggravating factors: activity. Alleviating factors: none.         Past Medical History:  Diagnosis Date  . Angina   . Asthma   . Cirrhosis of liver ([place])    08-12-14 one week ago" told has cirrhosis"  . Coronary artery disease    s/p CABG  . Depression   . Diabetes mellitus    type 2  . Edema extremities    consistent edema lower legs, feet, and right quadrant abdominal pain-"goes and comes"  . Fibromyalgia   . GERD (gastroesophageal reflux disease)   . H/O hiatal hernia   . Headache(784.0)    occ. generalized  . Heart murmur   . Hypercholesteremia   . Hypertension   . Hypothyroid   . Myocardial infarction 11/2011   MI x2- [id number] coronary stent(chest pain episode at time)  . PONV (postoperative nausea and  vomiting)     Patient Active Problem List   Diagnosis Date Noted  . Chest pain 05/29/2015  . Memory loss 05/29/2015  . Essential hypertension 03/20/2015  . Acute respiratory disease 02/02/2015  . Dyspnea 02/02/2015  . Acute respiratory distress 02/02/2015  . Heart murmur   . Depression   . GERD (gastroesophageal reflux disease)   . Unstable angina pectoris ([place]) 11/25/2011  . Hypothyroidism   . CAD (coronary artery disease)   . Other and unspecified angina pectoris 11/19/2011  . Type II or unspecified type diabetes mellitus without mention of complication, not stated as uncontrolled 11/19/2011  . Myocardial infarction 11/09/2011    Past Surgical History:  Procedure Laterality Date  . CHOLECYSTECTOMY     open many yrs ago  . COLONOSCOPY WITH PROPOFOL N/A 08/24/2014   Procedure: COLONOSCOPY WITH PROPOFOL;  Surgeon: Arta Silence, MD;  Location: [facility];  Service: Endoscopy;  Laterality: N/A;  . CORONARY ANGIOPLASTY WITH STENT PLACEMENT  05/06/2012  . CORONARY ARTERY BYPASS GRAFT  11/27/2011   Procedure: CORONARY ARTERY BYPASS GRAFTING (CABG);  Surgeon: [name], MD;  Location: [facility];  Service: Open Heart Surgery;  Laterality: N/A;  coronary artery bypass graft on pump times two utilizing left internal mammary artery and right saphenous vein harvested endoscopically   . ESOPHAGOGASTRODUODENOSCOPY (EGD) WITH PROPOFOL N/A 08/24/2014   Procedure: ESOPHAGOGASTRODUODENOSCOPY (  EGD) WITH PROPOFOL;  Surgeon: Arta Silence, MD;  Location: [facility];  Service: Endoscopy;  Laterality: N/A;  . KNEE SURGERY Right    scope surgery  . LEFT HEART CATHETERIZATION WITH CORONARY ANGIOGRAM N/A 11/19/2011   Procedure: LEFT HEART CATHETERIZATION WITH CORONARY ANGIOGRAM;  Surgeon: Candee Furbish, MD;  Location: [facility] CATH LAB;  Service: Cardiovascular;  Laterality: N/A;  . LEFT HEART CATHETERIZATION WITH CORONARY ANGIOGRAM  05/06/2012   Procedure: LEFT HEART CATHETERIZATION WITH CORONARY  ANGIOGRAM;  Surgeon: Jettie Booze, MD;  Location: [facility] CATH LAB;  Service: Cardiovascular;;  . LEFT HEART CATHETERIZATION WITH CORONARY/GRAFT ANGIOGRAM N/A 03/24/2012   Procedure: LEFT HEART CATHETERIZATION WITH Beatrix Fetters;  Surgeon: Jettie Booze, MD;  Location: [facility] CATH LAB;  Service: Cardiovascular;  Laterality: N/A;  . PERCUTANEOUS CORONARY STENT INTERVENTION (PCI-S)  03/24/2012   Procedure: PERCUTANEOUS CORONARY STENT INTERVENTION (PCI-S);  Surgeon: Jettie Booze, MD;  Location: [facility] CATH LAB;  Service: Cardiovascular;;  . PERCUTANEOUS CORONARY STENT INTERVENTION (PCI-S) Bilateral 05/06/2012   Procedure: PERCUTANEOUS CORONARY STENT INTERVENTION (PCI-S);  Surgeon: Jettie Booze, MD;  Location: [facility] CATH LAB;  Service: Cardiovascular;  Laterality: Bilateral;    OB History    No data available       Home Medications    Prior to Admission medications   Medication Sig Start Date End Date Taking? Authorizing Provider  albuterol (PROVENTIL HFA;VENTOLIN HFA) 108 (90 BASE) MCG/ACT inhaler Inhale 2 puffs into the lungs every 6 (six) hours as needed for wheezing or shortness of breath. 02/04/15  Yes Shanker Kristeen Mans, MD  albuterol (PROVENTIL) (2.5 MG/3ML) 0.083% nebulizer solution Take 3 mLs (2.5 mg total) by nebulization every 2 (two) hours as needed for wheezing. 02/04/15  Yes Shanker Kristeen Mans, MD  aspirin 81 MG tablet Take 81 mg by mouth daily.   Yes Historical Provider, MD  atorvastatin (LIPITOR) 40 MG tablet Take 40 mg by mouth daily.   Yes Historical Provider, MD  budesonide-formoterol (SYMBICORT) 80-4.5 MCG/ACT inhaler Inhale 2 puffs into the lungs 2 (two) times daily.   Yes Historical Provider, MD  canagliflozin (INVOKANA) 100 MG TABS tablet Take 100 mg by mouth daily before breakfast.   Yes Historical Provider, MD  DULoxetine (CYMBALTA) 30 MG capsule Take 30 mg by mouth daily with lunch. In conjunction with one 60 mg capsule to equal a total of 90 milligrams    Yes Historical Provider, MD  DULoxetine (CYMBALTA) 60 MG capsule Take 60 mg by mouth daily with lunch. In conjunction with one 30 mg capsule to equal a total of 90 milligrams   Yes Historical Provider, MD  Insulin Glargine (LANTUS SOLOSTAR) 100 UNIT/ML Solostar Pen Inject 100 Units into the skin daily before breakfast.    Yes Historical Provider, MD  levothyroxine (SYNTHROID, LEVOTHROID) 50 MCG tablet Take 50 mcg by mouth daily before breakfast.   Yes Historical Provider, MD  metoprolol tartrate (LOPRESSOR) 25 MG tablet Take 12.5 mg by mouth 2 (two) times daily.    Yes Historical Provider, MD  nitroGLYCERIN (NITROSTAT) 0.4 MG SL tablet Place 0.4 mg under the tongue every 5 (five) minutes as needed. For chest pain   Yes Historical Provider, MD  omeprazole-sodium bicarbonate (ZEGERID) 40-1100 MG per capsule Take 1 capsule by mouth daily before breakfast.   Yes Historical Provider, MD  sitaGLIPtin-metformin (JANUMET) 50-1000 MG per tablet Take 1 tablet by mouth 2 (two) times daily with a meal.   Yes Historical Provider, MD  SSD 1 % cream Apply 1 application  topically daily as needed (for irritation).  10/02/16  Yes Historical Provider, MD  valsartan (DIOVAN) 80 MG tablet Take 1 tablet (80 mg total) by mouth daily. 05/08/16  Yes Tanda Rockers, MD  gabapentin (NEURONTIN) 100 MG capsule Take 100-300 mg by mouth 3 (three) times daily as needed (FOR BURNING PAIN).  09/04/16   Historical Provider, MD  oxycodone (OXY-IR) 5 MG capsule Take 5 mg by mouth every 6 (six) hours as needed for pain.     Historical Provider, MD  valACYclovir (VALTREX) 1000 MG tablet Take 1,000 mg by mouth 2 (two) times daily.    Historical Provider, MD    Family History Family History  Problem Relation Age of Onset  . Heart disease Father 29    died of MI  . Emphysema Father     smoked  . Peripheral vascular disease Mother 40    bilaterial amputations    Social History Social History  Substance Use Topics  . Smoking status:  Never Smoker  . Smokeless tobacco: Never Used  . Alcohol use No     Allergies   Exenatide and Ace inhibitors   Review of Systems Review of Systems  Constitutional: Negative for diaphoresis and fever.  Eyes: Negative for redness.  Respiratory: Positive for shortness of breath. Negative for cough.   Cardiovascular: Positive for chest pain and leg swelling. Negative for palpitations.  Gastrointestinal: Negative for abdominal pain, nausea and vomiting.  Genitourinary: Negative for dysuria.  Musculoskeletal: Negative for back pain and neck pain.  Skin: Positive for rash (new tonight, left ankle).  Neurological: Negative for syncope and light-headedness.  Psychiatric/Behavioral: The patient is not nervous/anxious.      Physical Exam Updated Vital Signs BP 135/61 (BP Location: Right Arm)   Pulse 74   Temp 97.6 F (36.4 C) (Oral)   Resp 20   Ht 5\' 2"  (1.575 m)   Wt 80.7 kg   SpO2 100%   BMI 32.54 kg/m   Physical Exam  Constitutional: She appears well-developed and well-nourished.  HENT:  Head: Normocephalic and atraumatic.  Mouth/Throat: Oropharynx is clear and moist.  Eyes: Conjunctivae are normal. Right eye exhibits no discharge. Left eye exhibits no discharge.  Neck: Normal range of motion. Neck supple. JVD (mild) present.  Cardiovascular: Normal rate and regular rhythm.   Murmur (III/VI systolic at upper LSB) heard. Pulmonary/Chest: Effort normal. She has decreased breath sounds in the right lower field and the left lower field.  Abdominal: Soft. There is no tenderness.  Neurological: She is alert.  Skin: Skin is warm and dry. Rash (small area maculopapular rash inner aspect L ankle. Appears to be contact dermatitis. ) noted.  Psychiatric: She has a normal mood and affect.  Nursing note and vitals reviewed.    ED Treatments / Results  Labs (all labs ordered are listed, but only abnormal results are displayed) Labs Reviewed  BASIC METABOLIC PANEL - Abnormal;  Notable for the following:       Result Value   Glucose, Bld 310 (*)    Calcium 8.6 (*)    All other components within normal limits  CBC - Abnormal; Notable for the following:    Hemoglobin 10.4 (*)    HCT 33.1 (*)    MCH 24.8 (*)    RDW 18.0 (*)    Platelets 79 (*)    All other components within normal limits  TROPONIN I  BRAIN NATRIURETIC PEPTIDE  I-STAT TROPOININ, ED    EKG  EKG Interpretation  Date/Time:  Tuesday November 19 2016 14:56:37 EST Ventricular Rate:  71 PR Interval:  146 QRS Duration: 68 QT Interval:  390 QTC Calculation: 423 R Axis:   11 Text Interpretation:  Normal sinus rhythm Cannot rule out Anterior infarct , age undetermined Abnormal ECG Confirmed by ZAMMIT  MD, JOSEPH [id number]) on 11/19/2016 7:15:48 PM       Radiology Dg Chest 2 View  Result Date: 11/19/2016 CLINICAL DATA:  Initial evaluation for acute chest pain, shortness of breath. EXAM: CHEST  2 VIEW COMPARISON:  Prior radiograph from 02/02/2015. FINDINGS: Median sternotomy wires underlying CABG markers noted. Cardiac and mediastinal silhouettes are stable. Lungs are hypoinflated. Bilateral pleural effusions with associated bibasilar opacities, likely atelectasis. No overt pulmonary edema. No other focal infiltrates. No pneumothorax. No acute osseous abnormality. IMPRESSION: 1. Small bilateral pleural effusions, right slightly larger than left. Associated mild bibasilar opacities favored to reflect atelectasis. 2. Sequelae of prior CABG. Electronically Signed   By: Jeannine Boga M.D.   On: 11/19/2016 15:37    Procedures Procedures (including critical care time)  Medications Ordered in ED Medications  furosemide (LASIX) injection 40 mg (not administered)     Initial Impression / Assessment and Plan / ED Course  I have reviewed the triage vital signs and the nursing notes.  Pertinent labs & imaging results that were available during my care of the patient were reviewed by me and  considered in my medical decision making (see chart for details).  Clinical Course    Patient seen and examined. Work-up initiated. Medications ordered. Discussed with Dr. Roderic Palau. I have spoken with cardiology who will see and consult. Will admit to medicine service.   Vital signs reviewed and are as follows: BP 135/61 (BP Location: Right Arm)   Pulse 74   Temp 97.6 F (36.4 C) (Oral)   Resp 20   Ht 5\' 2"  (1.575 m)   Wt 80.7 kg   SpO2 100%   BMI 32.54 kg/m   8:44 PM Cardiology has seen.   Dr. Lorin Mercy to admit. Pt updated.   Final Clinical Impressions(s) / ED Diagnoses   Final diagnoses:  Shortness of breath  Lower extremity edema    New Prescriptions New Prescriptions   No medications on file     Carlisle Cater, Hershal Coria 11/19/16 2044    Milton Ferguson, MD 11/19/16 [phone]

## 2016-11-20 ENCOUNTER — Inpatient Hospital Stay (HOSPITAL_COMMUNITY): Payer: Medicare Other

## 2016-11-20 DIAGNOSIS — R011 Cardiac murmur, unspecified: Secondary | ICD-10-CM

## 2016-11-20 DIAGNOSIS — R188 Other ascites: Secondary | ICD-10-CM

## 2016-11-20 DIAGNOSIS — I509 Heart failure, unspecified: Secondary | ICD-10-CM

## 2016-11-20 DIAGNOSIS — K7460 Unspecified cirrhosis of liver: Secondary | ICD-10-CM | POA: Diagnosis present

## 2016-11-20 DIAGNOSIS — I10 Essential (primary) hypertension: Secondary | ICD-10-CM

## 2016-11-20 DIAGNOSIS — I5031 Acute diastolic (congestive) heart failure: Secondary | ICD-10-CM

## 2016-11-20 DIAGNOSIS — D61818 Other pancytopenia: Secondary | ICD-10-CM

## 2016-11-20 DIAGNOSIS — E43 Unspecified severe protein-calorie malnutrition: Secondary | ICD-10-CM | POA: Diagnosis present

## 2016-11-20 DIAGNOSIS — Z794 Long term (current) use of insulin: Secondary | ICD-10-CM

## 2016-11-20 DIAGNOSIS — E039 Hypothyroidism, unspecified: Secondary | ICD-10-CM

## 2016-11-20 DIAGNOSIS — E1165 Type 2 diabetes mellitus with hyperglycemia: Secondary | ICD-10-CM

## 2016-11-20 LAB — BASIC METABOLIC PANEL
ANION GAP: 7 (ref 5–15)
BUN: 8 mg/dL (ref 6–20)
CALCIUM: 8.8 mg/dL — AB (ref 8.9–10.3)
CO2: 28 mmol/L (ref 22–32)
Chloride: 107 mmol/L (ref 101–111)
Creatinine, Ser: 0.7 mg/dL (ref 0.44–1.00)
GFR calc Af Amer: >60 mL/min (ref >60)
GLUCOSE: 113 mg/dL — AB (ref 65–99)
Potassium: 3.8 mmol/L (ref 3.5–5.1)
SODIUM: 142 mmol/L (ref 135–145)

## 2016-11-20 LAB — HEPATIC FUNCTION PANEL
ALBUMIN: 2.5 g/dL — AB (ref 3.5–5.0)
ALK PHOS: 116 U/L (ref 38–126)
ALT: 39 U/L (ref 14–54)
AST: 78 U/L — AB (ref 15–41)
BILIRUBIN TOTAL: 1.7 mg/dL — AB (ref 0.3–1.2)
Bilirubin, Direct: 0.7 mg/dL — ABNORMAL HIGH (ref 0.1–0.5)
Indirect Bilirubin: 1 mg/dL — ABNORMAL HIGH (ref 0.3–0.9)
TOTAL PROTEIN: 5.2 g/dL — AB (ref 6.5–8.1)

## 2016-11-20 LAB — GLUCOSE, CAPILLARY
GLUCOSE-CAPILLARY: 78 mg/dL (ref 65–99)
GLUCOSE-CAPILLARY: 209 mg/dL — AB (ref 65–99)
GLUCOSE-CAPILLARY: 254 mg/dL — AB (ref 65–99)
Glucose-Capillary: 106 mg/dL — ABNORMAL HIGH (ref 65–99)
Glucose-Capillary: 151 mg/dL — ABNORMAL HIGH (ref 65–99)
Glucose-Capillary: 391 mg/dL — ABNORMAL HIGH (ref 65–99)

## 2016-11-20 LAB — TSH: TSH: 3.084 u[IU]/mL (ref 0.350–4.500)

## 2016-11-20 LAB — ECHOCARDIOGRAM COMPLETE
Height: 62 in
Weight: 2768 oz

## 2016-11-20 LAB — TROPONIN I: Troponin I: <0.03 ng/mL (ref <0.03)

## 2016-11-20 MED ORDER — GLUCERNA SHAKE PO LIQD
237.0000 mL | Freq: Two times a day (BID) | ORAL | Status: DC
  Administered 2016-11-21 (×2): 237 mL via ORAL

## 2016-11-20 MED ORDER — CYCLOBENZAPRINE HCL 5 MG PO TABS
5.0000 mg | ORAL_TABLET | Freq: Once | ORAL | Status: AC
  Administered 2016-11-20: 5 mg via ORAL
  Filled 2016-11-20: qty 1

## 2016-11-20 MED ORDER — SPIRONOLACTONE 25 MG PO TABS
25.0000 mg | ORAL_TABLET | Freq: Every day | ORAL | Status: DC
  Administered 2016-11-20 – 2016-11-22 (×3): 25 mg via ORAL
  Filled 2016-11-20 (×3): qty 1

## 2016-11-20 MED ORDER — INSULIN GLARGINE 100 UNIT/ML ~~LOC~~ SOLN
50.0000 [IU] | Freq: Two times a day (BID) | SUBCUTANEOUS | Status: DC
  Administered 2016-11-20 – 2016-11-22 (×5): 50 [IU] via SUBCUTANEOUS
  Filled 2016-11-20 (×7): qty 0.5

## 2016-11-20 MED ORDER — HYDROCORTISONE 1 % EX CREA
TOPICAL_CREAM | Freq: Two times a day (BID) | CUTANEOUS | Status: DC
  Administered 2016-11-20: 1 via TOPICAL
  Administered 2016-11-20 (×2): via TOPICAL
  Administered 2016-11-21: 1 via TOPICAL
  Administered 2016-11-21: 10:00:00 via TOPICAL
  Filled 2016-11-20: qty 28

## 2016-11-20 MED ORDER — ADULT MULTIVITAMIN W/MINERALS CH
1.0000 | ORAL_TABLET | Freq: Every day | ORAL | Status: DC
  Administered 2016-11-20 – 2016-11-22 (×3): 1 via ORAL
  Filled 2016-11-20 (×3): qty 1

## 2016-11-20 NOTE — Progress Notes (Signed)
Initially pt blood sugar was 222 on admission. Insulin given, pt refused snack stating that she had a dinner while waiting in ED.  During the night  blood sugar went down to 78. Pt. asymptomatic. Insulin Lantus ordered at 3 am not given. Pt. Was given a snack.    Blood sugar  In the morning 152. Pt. Reports that she feels good. Morning sliding scale given along with snack. Will continue to monitor.   Islay Polanco, RN

## 2016-11-20 NOTE — Evaluation (Signed)
Physical Therapy Evaluation & discharge Patient Details Name: Denise Cuevas MRN: [id number] DOB: 1951-04-01 Today's Date: 11/20/2016   History of Present Illness   Denise Cuevas is a 65 y.o. female with medical history significant of CAD s/p CABG, idiopathic cirrhosis of the liver, hypothyroidism, HTN, HLD, fibromyalgia, and DM presenting with new-onset CHF.  Clinical Impression  Pt is at baseline level and no skilled PT needs identified.  Pt able to manage stairs with 1 rail with o2 sat 100% after gait.  Will sign off.    Follow Up Recommendations No PT follow up    Equipment Recommendations  None recommended by PT    Recommendations for Other Services       Precautions / Restrictions Precautions Precautions: None Restrictions Weight Bearing Restrictions: No      Mobility  Bed Mobility Overal bed mobility: Modified Independent                Transfers Overall transfer level: Modified independent                  Ambulation/Gait Ambulation/Gait assistance: Modified independent (Device/Increase time) Ambulation Distance (Feet): 200 Feet Assistive device: None Gait Pattern/deviations: Step-through pattern;Wide base of support     General Gait Details: Pt with slightly widened BOS and no AD with minimal SOB at end of gait with o2 sat 100%. Pt able to turn head and nod with no LOB.  Stairs Stairs: Yes Stairs assistance: Supervision Stair Management: Alternating pattern;Step to pattern;One rail Right;Forwards Number of Stairs: 8 General stair comments: step through pattern with ascent and step to pattern with descent  Wheelchair Mobility    Modified Rankin (Stroke Patients Only)       Balance Overall balance assessment: No apparent balance deficits (not formally assessed)                                           Pertinent Vitals/Pain Pain Assessment: No/denies pain    Home Living Family/patient expects to be  discharged to:: Private residence Living Arrangements: Spouse/significant other;Children (husband and adult autistic daughter) Available Help at Discharge: Family Type of Home: House Home Access: Stairs to enter Entrance Stairs-Rails: Right Entrance Stairs-Number of Steps: 7 Home Layout: Multi-level;Able to live on main level with bedroom/bathroom Home Equipment: None      Prior Function Level of Independence: Independent         Comments: Retired Administrator, arts, 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, newspaper        Extremity/Trunk Assessment   Upper Extremity Assessment Upper Extremity Assessment: Overall WFL for tasks assessed    Lower Extremity Assessment Lower Extremity Assessment: Overall WFL for tasks assessed    Cervical / Trunk Assessment Cervical / Trunk Assessment: Normal  Communication   Communication: No difficulties  Cognition Arousal/Alertness: Awake/alert Behavior During Therapy: WFL for tasks assessed/performed Overall Cognitive Status: Within Functional Limits for tasks assessed                      General Comments      Exercises     Assessment/Plan    PT Assessment Patent does not need any further PT services  PT Problem List            PT Treatment Interventions      PT Goals (Current goals can be found in the Care  Plan section)  Acute Rehab PT Goals Patient Stated Goal: go home PT Goal Formulation: All assessment and education complete, DC therapy    Frequency     Barriers to discharge        Co-evaluation               End of Session Equipment Utilized During Treatment: Gait belt Activity Tolerance: Patient tolerated treatment well Patient left: in chair;with call bell/phone within reach;with family/visitor present Nurse Communication: Mobility status         Time: [id number] (no charge for time MD in room)-1041 PT Time Calculation (min) (ACUTE ONLY): 33 min   Charges:   PT Evaluation $PT  Eval Low Complexity: 1 Procedure PT Treatments $Gait Training: 8-22 mins   PT G Codes:        Maisee Vollman LUBECK 11/20/2016, 11:24 AM

## 2016-11-20 NOTE — Progress Notes (Signed)
Initial Nutrition Assessment   INTERVENTION:  Provide Glucerna Shake po BID, each supplement provides 220 kcal and 10 grams of protein Provide Multivitamin with minerals daily Provide HS snack  Provided and discussed "Low Sodium Nutrition Therapy" handout from the Academy of [employer]etics   NUTRITION DIAGNOSIS:   Inadequate oral intake related to poor appetite as evidenced by per patient/family report.   GOAL:   Patient will meet greater than or equal to 90% of their needs   MONITOR:   PO intake, Supplement acceptance, Labs, Skin, I & O's, Weight trends  REASON FOR ASSESSMENT:   Consult Assessment of nutrition requirement/status  ASSESSMENT:   65 y.o. female with medical history significant of CAD s/p CABG, idiopathic cirrhosis of the liver, hypothyroidism, HTN, HLD, fibromyalgia, and DM presenting with new-onset CHF.  Pt reports losing 40 lbs in the past year due to poor appetite and lethargy from medications. She states that she used to weigh close to 200 lbs a year ago. She reports that some days she doesn't eat anything and some days she only eats one meal. Per weight history, pt weighed 177 lbs about 18 months ago. Pt appears well nourished. RD discussed the importance of consistent PO intake with healthful foods. She reports following a low sodium carb balanced diet; she saw a dietitian after having multiple heart attacks a few years ago. She also takes Lantus once daily. However, pt was unable to provide any details as to how she follows a low sodium diet and she reports eating out most meals.   RD provided "Low Sodium Nutrition Therapy" handout from the Academy of [employer]etics. Reviewed patient's dietary recall. Provided examples on ways to decrease sodium intake in diet. Discouraged intake of processed foods and use of salt shaker. Encouraged fresh fruits and vegetables as well as whole grain sources of carbohydrates to maximize fiber intake.   RD  discussed why it is important for patient to adhere to diet recommendations, and emphasized the role of fluids, foods to avoid, and importance of weighing self daily. Teach back method used.  Expect fair compliance.  Labs: elevated glucose, low calcium, low hemoglobin  Diet Order:  Diet Carb Modified Fluid consistency: Thin; Room service appropriate? Yes  Skin:  Reviewed, no issues  Last BM:  12/11  Height:   Ht Readings from Last 1 Encounters:  11/19/16 5\' 2"  (1.575 m)    Weight:   Wt Readings from Last 1 Encounters:  11/20/16 173 lb (78.5 kg)    Ideal Body Weight:  50 kg  BMI:  Body mass index is 31.64 kg/m.  Estimated Nutritional Needs:   Kcal:  1600-1800  Protein:  80-90 grams  Fluid:  per MD  EDUCATION NEEDS:   Education needs addressed  Scarlette Ar RD, CSP, LDN Inpatient Clinical Dietitian Pager: [phone] After Hours Pager: [phone]

## 2016-11-20 NOTE — Progress Notes (Addendum)
Patient Name: Denise Cuevas Date of Encounter: 11/20/2016  Primary Cardiologist: [facility] Problem List     Principal Problem:   Congestive heart failure [facility]) Active Problems:   Type 2 diabetes mellitus, uncontrolled (HCC)   Hypothyroidism   Heart murmur   Essential hypertension   Cirrhosis of liver not due to alcohol (HCC)   Pancytopenia (HCC)   Protein-calorie malnutrition, severe (HCC)     Subjective   Feels better. No CP. Previously in clinic was forgetful and tearful about this, GI worried it was from hepatic encephalopathy. Last clinic note from 05/29/15  Inpatient Medications    Scheduled Meds: . aspirin EC  81 mg Oral Daily  . atorvastatin  40 mg Oral Daily  . DULoxetine  90 mg Oral Q lunch  . feeding supplement (ENSURE ENLIVE)  237 mL Oral BID BM  . furosemide  80 mg Intravenous BID  . hydrocortisone cream   Topical BID  . insulin aspart  0-15 Units Subcutaneous TID WC  . insulin aspart  0-5 Units Subcutaneous QHS  . insulin glargine  50 Units Subcutaneous BID  . irbesartan  75 mg Oral Daily  . levothyroxine  50 mcg Oral QAC breakfast  . metoprolol tartrate  12.5 mg Oral BID  . mometasone-formoterol  2 puff Inhalation BID  . pantoprazole  80 mg Oral Daily  . sodium chloride flush  3 mL Intravenous Q12H  . valACYclovir  1,000 mg Oral BID   Continuous Infusions:  PRN Meds: sodium chloride, acetaminophen, albuterol, gabapentin, ondansetron (ZOFRAN) IV, oxyCODONE, sodium chloride flush   Vital Signs    Vitals:   11/19/16 2200 11/20/16 0504 11/20/16 0614 11/20/16 0740  BP: 131/60 (!) 134/41  (!) 151/54  Pulse: 77 67  70  Resp: 18 18  18   Temp: 98.1 F (36.7 C) 98.1 F (36.7 C)  98.4 F (36.9 C)  TempSrc: Oral Oral  Oral  SpO2: 100% 96%  92%  Weight: 173 lb 12.8 oz (78.8 kg)  173 lb (78.5 kg)   Height: 5\' 2"  (1.575 m)       Intake/Output Summary (Last 24 hours) at 11/20/16 1042 Last data filed at 11/20/16 0617  Gross per 24 hour    Intake              272 ml  Output             1300 ml  Net            -1028 ml   Filed Weights   11/19/16 1506 11/19/16 2200 11/20/16 0614  Weight: 177 lb 14.4 oz (80.7 kg) 173 lb 12.8 oz (78.8 kg) 173 lb (78.5 kg)    Physical Exam    GEN: Well nourished, well developed, in no acute distress.  HEENT: Grossly normal.  Neck: Supple, no JVD, carotid bruits, or masses. Cardiac: RRR, 2/6 systolic murmur, no rubs, or gallops. No clubbing, cyanosis, 2+ BLE edema.  Radials/DP/PT 2+ and equal bilaterally.  Respiratory:  Respirations regular and unlabored, clear to auscultation bilaterally. GI: Soft, nontender, nondistended, BS + x 4. MS: no deformity or atrophy. Skin: warm and dry, no rash. Neuro:  Strength and sensation are intact. Psych: AAOx3.  Normal affect.  Labs    CBC  Recent Labs  11/19/16 1503 11/19/16 2252  WBC 4.2 3.8*  NEUTROABS  --  1.3*  HGB 10.4* 10.3*  HCT 33.1* 32.5*  MCV 79.0 78.7  PLT 79* 73*   Basic Metabolic Panel  Recent Labs  11/19/16 1503 11/20/16 0458  NA 135 142  K 3.8 3.8  CL 104 107  CO2 24 28  GLUCOSE 310* 113*  BUN 8 8  CREATININE 0.64 0.70  CALCIUM 8.6* 8.8*   Liver Function Tests  Recent Labs  11/19/16 2252  AST 78*  ALT 39  ALKPHOS 116  BILITOT 1.7*  PROT 5.2*  ALBUMIN 2.5*   No results for input(s): LIPASE, AMYLASE in the last 72 hours. Cardiac Enzymes  Recent Labs  11/19/16 2023 11/19/16 2252 11/20/16 0458  TROPONINI <0.03 <0.03 <0.03   Thyroid Function Tests  Recent Labs  11/19/16 2252  TSH 3.084    Telemetry    No adverse rhythms - Personally Reviewed  ECG    NSR, PRWP, no ischemic changes - Personally Reviewed  Radiology    Dg Chest 2 View  Result Date: 11/19/2016 CLINICAL DATA:  Initial evaluation for acute chest pain, shortness of breath. EXAM: CHEST  2 VIEW COMPARISON:  Prior radiograph from 02/02/2015. FINDINGS: Median sternotomy wires underlying CABG markers noted. Cardiac and  mediastinal silhouettes are stable. Lungs are hypoinflated. Bilateral pleural effusions with associated bibasilar opacities, likely atelectasis. No overt pulmonary edema. No other focal infiltrates. No pneumothorax. No acute osseous abnormality. IMPRESSION: 1. Small bilateral pleural effusions, right slightly larger than left. Associated mild bibasilar opacities favored to reflect atelectasis. 2. Sequelae of prior CABG. Electronically Signed   By: Jeannine Boga M.D.   On: 11/19/2016 15:37    Cardiac Studies   NUC stress 05/30/15  Defect 1: There is a medium defect of moderate severity present in the basal inferolateral location.  Defect 2: There is a small defect of moderate severity present in the apical lateral and apex location.  Findings consistent with prior myocardial infarction.  This is an intermediate risk study.  The left ventricular ejection fraction is normal (55-65%).  Abnormal study. Old infarction noted, no ischemia   ECHO 11/20/16 - Left ventricle: The cavity size was normal. Wall thickness was   normal. Systolic function was normal. The estimated ejection   fraction was in the range of 60% to 65%. The study is not   technically sufficient to allow evaluation of LV diastolic   function. - Aortic valve: There was mild stenosis. Valve area (VTI): 0.97   cm^2. Valve area (Vmax): 1.05 cm^2. Valve area (Vmean): 0.98   cm^2. - Mitral valve: Calcified annulus. There was mild regurgitation. - Left atrium: The atrium was mildly dilated. - Atrial septum: No defect or patent foramen ovale was identified. - Impressions: Gradients actually slightly lower than 02/03/15.  Impressions:  - Gradients actually slightly lower than 02/03/15.  Patient Profile     65 year old post CABG with CAD, cirrhosis, normal EF  Assessment & Plan    Acute diastolic heart failure  - increased fluid overload, likely cirrhosis contributing if not the actual cause.   - Agree with IV  lasix 80mg  BID  Atypical chest pain  - recent NUC reassuring.  - trop normal  - no further testing  CAD post CABG  - as above  Aortic stenosis  - mild   HTN  - metop and ARB  Cirrhosis  - might be a good idea to start spironolactone 25mg  as well.   Signed, Candee Furbish, MD  11/20/2016, 10:42 AM

## 2016-11-20 NOTE — Progress Notes (Signed)
  Echocardiogram 2D Echocardiogram has been performed.  Diamond Nickel 11/20/2016, 9:32 AM

## 2016-11-20 NOTE — Progress Notes (Addendum)
PROGRESS NOTE  Denise Cuevas  [id number] DOB: November 19, 1951  DOA: 11/19/2016 PCP: Antony Blackbird, MD  Outpatient consultants: [name] - Cardiology; Buddy Duty - Endocrinology; [place] - Surgery; [facility] - GI  Brief Narrative:  65 y.o. female with medical history significant of CAD s/p CABG, idiopathic cirrhosis of the liver, hypothyroidism, HTN, HLD, fibromyalgia, and DM presented to [facility] ED on 11/19/16 with complaints of worsening dyspnea, lower extremity swelling and some redness, mild nonproductive cough, Central chest pain and dyspnea which worsened when she bent down, lost 40 pounds (intentional) but gained back 17 pounds in the last few weeks. Admitted for decompensated CHF. Cardiology consulted.   Assessment & Plan:   Principal Problem:   Congestive heart failure (HCC) Active Problems:   Type 2 diabetes mellitus, uncontrolled (HCC)   Hypothyroidism   Heart murmur   Essential hypertension   Cirrhosis of liver not due to alcohol (HCC)   Pancytopenia (HCC)   Protein-calorie malnutrition, severe ([place])   1. Acute diastolic CHF: Cirrhosis may be contributing to her volume overload. Treating with IV Lasix with improvement. -1.8 L since admission. Cardiology consultation and follow-up appreciated. 2. Atypical chest pain: Troponins 4 negative. Chest pain resolved. Likely related to decompensated CHF. Cardiology follow-up appreciated: Recent nuclear stress testing reassuring and no further testing recommended at this time. 3. Essential hypertension: Controlled. Continue metoprolol and ARB. 4. Mild aortic stenosis: Outpatient follow-up. 5. Idiopathic cirrhosis of liver: As per cardiology recommendation, start Aldactone 25 MG daily. 6. CAD status post CABG: Management as above. 7. Uncontrolled DM 2: Holding oral medications. Last A1c in 2/16 was 7.9. Follow repeat A1c results. Lantus changed from 100 units daily to 50 units twice a day and SSI added in the hospital. Fluctuating control. Adjust  insulins as needed. Diabetes coordinator consultation. 8. Hypothyroid: Continue Synthroid. 9. Pancytopenia: Possibly related to cirrhosis. Follow CBCs in a.m.   DVT prophylaxis: SCD's Code Status: Full Family Communication: Discussed with patient's sister at bedside. Disposition Plan: DC home possibly in the next 2-3 days.   Consultants:   Cardiology  Procedures:   2-D echo 11/20/16: Study Conclusions  - Left ventricle: The cavity size was normal. Wall thickness was   normal. Systolic function was normal. The estimated ejection   fraction was in the range of 60% to 65%. The study is not   technically sufficient to allow evaluation of LV diastolic   function. - Aortic valve: There was mild stenosis. Valve area (VTI): 0.97   cm^2. Valve area (Vmax): 1.05 cm^2. Valve area (Vmean): 0.98   cm^2. - Mitral valve: Calcified annulus. There was mild regurgitation. - Left atrium: The atrium was mildly dilated. - Atrial septum: No defect or patent foramen ovale was identified. - Impressions: Gradients actually slightly lower than 02/03/15.  Impressions:  - Gradients actually slightly lower than 02/03/15.  Antimicrobials:   None    Subjective: Feels much better. Dyspnea has improved but not yet at baseline. Left leg rash has also improved. Decreased leg swelling. No chest pain or cough.  Objective:  Vitals:   11/19/16 2200 11/20/16 0504 11/20/16 0614 11/20/16 0740  BP: 131/60 (!) 134/41  (!) 151/54  Pulse: 77 67  70  Resp: 18 18  18   Temp: 98.1 F (36.7 C) 98.1 F (36.7 C)  98.4 F (36.9 C)  TempSrc: Oral Oral  Oral  SpO2: 100% 96%  92%  Weight: 78.8 kg (173 lb 12.8 oz)  78.5 kg (173 lb)   Height: 5\' 2"  (1.575 m)  Intake/Output Summary (Last 24 hours) at 11/20/16 1541 Last data filed at 11/20/16 1330  Gross per 24 hour  Intake              272 ml  Output             2100 ml  Net            -1828 ml   Filed Weights   11/19/16 1506 11/19/16 2200 11/20/16  [id number]  Weight: 80.7 kg (177 lb 14.4 oz) 78.8 kg (173 lb 12.8 oz) 78.5 kg (173 lb)    Examination:  General exam: Pleasant middle-aged female seen ambulating comfortably in the room. PT and patient's sister at bedside. Respiratory system: Few basal crackles but otherwise clear to auscultation.Marland Kitchen Respiratory effort normal. Cardiovascular system: S1 & S2 heard, RRR. No JVD, murmurs, rubs, gallops or clicks. 1+ pedal edema. Telemetry: Sinus rhythm. Gastrointestinal system: Abdomen is nondistended, soft and nontender. No organomegaly or masses felt. Normal bowel sounds heard. Central nervous system: Alert and oriented. No focal neurological deficits. Extremities: Symmetric 5 x 5 power. Skin: Faintly erythematous rash over the right medial lower leg? Contact dermatitis. Improving Psychiatry: Judgement and insight appear normal. Mood & affect appropriate.     Data Reviewed: I have personally reviewed following labs and imaging studies  CBC:  Recent Labs Lab 11/19/16 1503 11/19/16 2252  WBC 4.2 3.8*  NEUTROABS  --  1.3*  HGB 10.4* 10.3*  HCT 33.1* 32.5*  MCV 79.0 78.7  PLT 79* 73*   Basic Metabolic Panel:  Recent Labs Lab 11/19/16 1503 11/20/16 0458  NA 135 142  K 3.8 3.8  CL 104 107  CO2 24 28  GLUCOSE 310* 113*  BUN 8 8  CREATININE 0.64 0.70  CALCIUM 8.6* 8.8*   GFR: Estimated Creatinine Clearance: 69.9 mL/min (by C-G formula based on SCr of 0.7 mg/dL). Liver Function Tests:  Recent Labs Lab 11/19/16 2252  AST 78*  ALT 39  ALKPHOS 116  BILITOT 1.7*  PROT 5.2*  ALBUMIN 2.5*   No results for input(s): LIPASE, AMYLASE in the last 168 hours. No results for input(s): AMMONIA in the last 168 hours. Coagulation Profile:  Recent Labs Lab 11/19/16 2252  INR 1.28   Cardiac Enzymes:  Recent Labs Lab 11/19/16 2023 11/19/16 2252 11/20/16 0458 11/20/16 1155  TROPONINI <0.03 <0.03 <0.03 <0.03   BNP (last 3 results) No results for input(s): PROBNP in the last  8760 hours. HbA1C: No results for input(s): HGBA1C in the last 72 hours. CBG:  Recent Labs Lab 11/19/16 2151 11/20/16 0251 11/20/16 0437 11/20/16 0558 11/20/16 1144  GLUCAP 222* 106* 78 151* 391*   Lipid Profile: No results for input(s): CHOL, HDL, LDLCALC, TRIG, CHOLHDL, LDLDIRECT in the last 72 hours. Thyroid Function Tests:  Recent Labs  11/19/16 2252  TSH 3.084   Anemia Panel: No results for input(s): VITAMINB12, FOLATE, FERRITIN, TIBC, IRON, RETICCTPCT in the last 72 hours.  Sepsis Labs: No results for input(s): PROCALCITON, LATICACIDVEN in the last 168 hours.  No results found for this or any previous visit (from the past 240 hour(s)).       Radiology Studies: Dg Chest 2 View  Result Date: 11/19/2016 CLINICAL DATA:  Initial evaluation for acute chest pain, shortness of breath. EXAM: CHEST  2 VIEW COMPARISON:  Prior radiograph from 02/02/2015. FINDINGS: Median sternotomy wires underlying CABG markers noted. Cardiac and mediastinal silhouettes are stable. Lungs are hypoinflated. Bilateral pleural effusions with associated bibasilar opacities, likely atelectasis. No  overt pulmonary edema. No other focal infiltrates. No pneumothorax. No acute osseous abnormality. IMPRESSION: 1. Small bilateral pleural effusions, right slightly larger than left. Associated mild bibasilar opacities favored to reflect atelectasis. 2. Sequelae of prior CABG. Electronically Signed   By: Jeannine Boga M.D.   On: 11/19/2016 15:37        Scheduled Meds: . aspirin EC  81 mg Oral Daily  . atorvastatin  40 mg Oral Daily  . DULoxetine  90 mg Oral Q lunch  . feeding supplement (GLUCERNA SHAKE)  237 mL Oral BID BM  . furosemide  80 mg Intravenous BID  . hydrocortisone cream   Topical BID  . insulin aspart  0-15 Units Subcutaneous TID WC  . insulin aspart  0-5 Units Subcutaneous QHS  . insulin glargine  50 Units Subcutaneous BID  . irbesartan  75 mg Oral Daily  . levothyroxine  50  mcg Oral QAC breakfast  . metoprolol tartrate  12.5 mg Oral BID  . mometasone-formoterol  2 puff Inhalation BID  . multivitamin with minerals  1 tablet Oral Daily  . pantoprazole  80 mg Oral Daily  . sodium chloride flush  3 mL Intravenous Q12H  . valACYclovir  1,000 mg Oral BID   Continuous Infusions:   LOS: 1 day      [facility], MD [facility] Pager [phone]  If 7PM-7AM, please contact night-coverage [email or web address] Password [email or web address] 11/20/2016, 3:41 PM

## 2016-11-21 LAB — BASIC METABOLIC PANEL
Anion gap: 9 (ref 5–15)
BUN: 9 mg/dL (ref 6–20)
CHLORIDE: 101 mmol/L (ref 101–111)
CO2: 30 mmol/L (ref 22–32)
CREATININE: 0.77 mg/dL (ref 0.44–1.00)
Calcium: 8.8 mg/dL — ABNORMAL LOW (ref 8.9–10.3)
GFR calc Af Amer: >60 mL/min (ref >60)
GFR calc non Af Amer: >60 mL/min (ref >60)
Glucose, Bld: 154 mg/dL — ABNORMAL HIGH (ref 65–99)
POTASSIUM: 3.8 mmol/L (ref 3.5–5.1)
SODIUM: 140 mmol/L (ref 135–145)

## 2016-11-21 LAB — CBC
HEMATOCRIT: 35.3 % — AB (ref 36.0–46.0)
Hemoglobin: 11.1 g/dL — ABNORMAL LOW (ref 12.0–15.0)
MCH: 24.7 pg — AB (ref 26.0–34.0)
MCHC: 31.4 g/dL (ref 30.0–36.0)
MCV: 78.4 fL (ref 78.0–100.0)
Platelets: 87 10*3/uL — ABNORMAL LOW (ref 150–400)
RBC: 4.5 MIL/uL (ref 3.87–5.11)
RDW: 17.7 % — AB (ref 11.5–15.5)
WBC: 5.8 10*3/uL (ref 4.0–10.5)

## 2016-11-21 LAB — HEMOGLOBIN A1C
HEMOGLOBIN A1C: 8.6 % — AB (ref 4.8–5.6)
Mean Plasma Glucose: 200 mg/dL

## 2016-11-21 LAB — GLUCOSE, CAPILLARY
GLUCOSE-CAPILLARY: 317 mg/dL — AB (ref 65–99)
GLUCOSE-CAPILLARY: 240 mg/dL — AB (ref 65–99)
GLUCOSE-CAPILLARY: 253 mg/dL — AB (ref 65–99)
Glucose-Capillary: 112 mg/dL — ABNORMAL HIGH (ref 65–99)
Glucose-Capillary: 389 mg/dL — ABNORMAL HIGH (ref 65–99)

## 2016-11-21 MED ORDER — INSULIN ASPART 100 UNIT/ML ~~LOC~~ SOLN
5.0000 [IU] | Freq: Three times a day (TID) | SUBCUTANEOUS | Status: DC
  Administered 2016-11-21: 5 [IU] via SUBCUTANEOUS

## 2016-11-21 NOTE — Progress Notes (Signed)
PROGRESS NOTE  Denise Cuevas  [id number] DOB: [birth year]  DOA: 11/19/2016 PCP: Antony Blackbird, MD  Outpatient consultants: [name] - Cardiology; Buddy Duty - Endocrinology; [place] - Surgery; [facility] - GI  Brief Narrative:  65 y.o. female with medical history significant of CAD s/p CABG, idiopathic cirrhosis of the liver, hypothyroidism, HTN, HLD, fibromyalgia, and DM presented to [facility] ED on 11/19/16 with complaints of worsening dyspnea, lower extremity swelling and some redness, mild nonproductive cough, Central chest pain and dyspnea which worsened when she bent down, lost 40 pounds (intentional) but gained back 17 pounds in the last few weeks. Admitted for decompensated CHF. Cardiology consulted. Improving.   Assessment & Plan:   Principal Problem:   Congestive heart failure (HCC) Active Problems:   Type 2 diabetes mellitus, uncontrolled (HCC)   Hypothyroidism   Heart murmur   Essential hypertension   Cirrhosis of liver not due to alcohol (HCC)   Pancytopenia (HCC)   Protein-calorie malnutrition, severe (HCC)   Acute diastolic CHF (congestive heart failure) ([place])   1. Acute diastolic CHF: Cirrhosis may be contributing to her volume overload. Treating with IV Lasix 80 mg twice a day and Aldactone 25 MG daily added, with improvement. - 6 L since admission. Cardiology consultation and follow-up appreciated. 2. Atypical chest pain: Troponins 4 negative. Chest pain resolved. Likely related to decompensated CHF. Cardiology follow-up appreciated: Recent nuclear stress testing reassuring and no further testing recommended at this time. 3. Essential hypertension: Controlled. Continue metoprolol and ARB. 4. Mild aortic stenosis: Outpatient follow-up. 5. Idiopathic cirrhosis of liver: As per cardiology recommendation, started Aldactone 25 MG daily. 6. CAD status post CABG: Management as above. 7. Uncontrolled DM 2: Holding oral medications. Last A1c in 2/16 was 7.9. Repeat A1c: 8.6 suggesting  poor outpatient control. Lantus changed from 100 units daily to 50 units twice a day and SSI added in the hospital. Fluctuating control. Adjust insulins as needed. Diabetes coordinator consultation appreciated. Add mealtime NovoLog 5 units 3 times a day. 8. Hypothyroid: Continue Synthroid. 9. Pancytopenia: Possibly related to cirrhosis. Stable.   DVT prophylaxis: SCD's Code Status: Full Family Communication: Discussed with patient's spouse at bedside Disposition Plan: DC home possibly in the next 1-2 days.   Consultants:   Cardiology  Procedures:   2-D echo 11/20/16: Study Conclusions  - Left ventricle: The cavity size was normal. Wall thickness was   normal. Systolic function was normal. The estimated ejection   fraction was in the range of 60% to 65%. The study is not   technically sufficient to allow evaluation of LV diastolic   function. - Aortic valve: There was mild stenosis. Valve area (VTI): 0.97   cm^2. Valve area (Vmax): 1.05 cm^2. Valve area (Vmean): 0.98   cm^2. - Mitral valve: Calcified annulus. There was mild regurgitation. - Left atrium: The atrium was mildly dilated. - Atrial septum: No defect or patent foramen ovale was identified. - Impressions: Gradients actually slightly lower than 02/03/15.  Impressions:  - Gradients actually slightly lower than 02/03/15.  Antimicrobials:   None    Subjective: Continues to feel better with improving leg swelling. No dyspnea or chest pain. No new complaints.  Objective:  Vitals:   11/20/16 2341 11/21/16 0553 11/21/16 1012 11/21/16 1138  BP: (!) 119/54 132/62 (!) 116/41 113/61  Pulse: 70 67 71 70  Resp: 16 18  16   Temp: 98.2 F (36.8 C) 98.2 F (36.8 C)  98.6 F (37 C)  TempSrc: Oral Oral  Oral  SpO2: 99% 98%  97%  Weight:  74.9 kg (165 lb 3.2 oz)    Height:        Intake/Output Summary (Last 24 hours) at 11/21/16 1710 Last data filed at 11/21/16 1651  Gross per 24 hour  Intake              730 ml    Output             3625 ml  Net            -2895 ml   Filed Weights   11/19/16 2200 11/20/16 0614 11/21/16 0553  Weight: 78.8 kg (173 lb 12.8 oz) 78.5 kg (173 lb) 74.9 kg (165 lb 3.2 oz)    Examination:  General exam: Pleasant middle-aged female Sitting up comfortably in chair. Respiratory system: Occasional basal crackles but otherwise clear to auscultation.Marland Kitchen Respiratory effort normal. Cardiovascular system: S1 & S2 heard, RRR. No JVD, murmurs, rubs, gallops or clicks. 1+ pedal edema-decreasing. Telemetry: Sinus rhythm. Gastrointestinal system: Abdomen is nondistended, soft and nontender. No organomegaly or masses felt. Normal bowel sounds heard. Central nervous system: Alert and oriented. No focal neurological deficits. Extremities: Symmetric 5 x 5 power. Skin: Faintly erythematous rash over the right medial lower leg? Contact dermatitis. Improving/stable Psychiatry: Judgement and insight appear normal. Mood & affect appropriate.     Data Reviewed: I have personally reviewed following labs and imaging studies  CBC:  Recent Labs Lab 11/19/16 1503 11/19/16 2252 11/21/16 0245  WBC 4.2 3.8* 5.8  NEUTROABS  --  1.3*  --   HGB 10.4* 10.3* 11.1*  HCT 33.1* 32.5* 35.3*  MCV 79.0 78.7 78.4  PLT 79* 73* 87*   Basic Metabolic Panel:  Recent Labs Lab 11/19/16 1503 11/20/16 0458 11/21/16 0245  NA 135 142 140  K 3.8 3.8 3.8  CL 104 107 101  CO2 24 28 30   GLUCOSE 310* 113* 154*  BUN 8 8 9   CREATININE 0.64 0.70 0.77  CALCIUM 8.6* 8.8* 8.8*   GFR: Estimated Creatinine Clearance: 68.2 mL/min (by C-G formula based on SCr of 0.77 mg/dL). Liver Function Tests:  Recent Labs Lab 11/19/16 2252  AST 78*  ALT 39  ALKPHOS 116  BILITOT 1.7*  PROT 5.2*  ALBUMIN 2.5*   No results for input(s): LIPASE, AMYLASE in the last 168 hours. No results for input(s): AMMONIA in the last 168 hours. Coagulation Profile:  Recent Labs Lab 11/19/16 2252  INR 1.28   Cardiac  Enzymes:  Recent Labs Lab 11/19/16 2023 11/19/16 2252 11/20/16 0458 11/20/16 1155  TROPONINI <0.03 <0.03 <0.03 <0.03   BNP (last 3 results) No results for input(s): PROBNP in the last 8760 hours. HbA1C:  Recent Labs  11/19/16 2252  HGBA1C 8.6*   CBG:  Recent Labs Lab 11/20/16 2106 11/21/16 0602 11/21/16 1136 11/21/16 1422 11/21/16 1646  GLUCAP 254* 112* 317* 240* 389*   Lipid Profile: No results for input(s): CHOL, HDL, LDLCALC, TRIG, CHOLHDL, LDLDIRECT in the last 72 hours. Thyroid Function Tests:  Recent Labs  11/19/16 2252  TSH 3.084   Anemia Panel: No results for input(s): VITAMINB12, FOLATE, FERRITIN, TIBC, IRON, RETICCTPCT in the last 72 hours.  Sepsis Labs: No results for input(s): PROCALCITON, LATICACIDVEN in the last 168 hours.  No results found for this or any previous visit (from the past 240 hour(s)).       Radiology Studies: No results found.      Scheduled Meds: . aspirin EC  81 mg Oral Daily  . atorvastatin  40 mg Oral Daily  . DULoxetine  90 mg Oral Q lunch  . feeding supplement (GLUCERNA SHAKE)  237 mL Oral BID BM  . furosemide  80 mg Intravenous BID  . hydrocortisone cream   Topical BID  . insulin aspart  0-15 Units Subcutaneous TID WC  . insulin aspart  0-5 Units Subcutaneous QHS  . insulin glargine  50 Units Subcutaneous BID  . irbesartan  75 mg Oral Daily  . levothyroxine  50 mcg Oral QAC breakfast  . metoprolol tartrate  12.5 mg Oral BID  . mometasone-formoterol  2 puff Inhalation BID  . multivitamin with minerals  1 tablet Oral Daily  . pantoprazole  80 mg Oral Daily  . sodium chloride flush  3 mL Intravenous Q12H  . spironolactone  25 mg Oral Daily  . valACYclovir  1,000 mg Oral BID   Continuous Infusions:   LOS: 2 days      [facility], MD [facility] Pager [phone]  If 7PM-7AM, please contact night-coverage [email or web address] Password [email or web address] 11/21/2016, 5:10 PM

## 2016-11-21 NOTE — Progress Notes (Signed)
Pt c/o leg and abdominal cramping, MD notified, flexeril ordered and given, will continue to monitor.

## 2016-11-21 NOTE — Progress Notes (Signed)
Patient Name: Denise Cuevas Date of Encounter: 11/21/2016  Primary Cardiologist: [facility] Problem List     Principal Problem:   Congestive heart failure [facility]) Active Problems:   Type 2 diabetes mellitus, uncontrolled (HCC)   Hypothyroidism   Heart murmur   Essential hypertension   Cirrhosis of liver not due to alcohol (HCC)   Pancytopenia (HCC)   Protein-calorie malnutrition, severe (HCC)   Acute diastolic CHF (congestive heart failure) ([place])     Subjective   Feels better. No CP.   Inpatient Medications    Scheduled Meds: . aspirin EC  81 mg Oral Daily  . atorvastatin  40 mg Oral Daily  . DULoxetine  90 mg Oral Q lunch  . feeding supplement (GLUCERNA SHAKE)  237 mL Oral BID BM  . furosemide  80 mg Intravenous BID  . hydrocortisone cream   Topical BID  . insulin aspart  0-15 Units Subcutaneous TID WC  . insulin aspart  0-5 Units Subcutaneous QHS  . insulin glargine  50 Units Subcutaneous BID  . irbesartan  75 mg Oral Daily  . levothyroxine  50 mcg Oral QAC breakfast  . metoprolol tartrate  12.5 mg Oral BID  . mometasone-formoterol  2 puff Inhalation BID  . multivitamin with minerals  1 tablet Oral Daily  . pantoprazole  80 mg Oral Daily  . sodium chloride flush  3 mL Intravenous Q12H  . spironolactone  25 mg Oral Daily  . valACYclovir  1,000 mg Oral BID   Continuous Infusions:  PRN Meds: sodium chloride, acetaminophen, albuterol, gabapentin, ondansetron (ZOFRAN) IV, oxyCODONE, sodium chloride flush   Vital Signs    Vitals:   11/20/16 2341 11/21/16 0553 11/21/16 1012 11/21/16 1138  BP: (!) 119/54 132/62 (!) 116/41 113/61  Pulse: 70 67 71 70  Resp: 16 18  16   Temp: 98.2 F (36.8 C) 98.2 F (36.8 C)  98.6 F (37 C)  TempSrc: Oral Oral  Oral  SpO2: 99% 98%  97%  Weight:  165 lb 3.2 oz (74.9 kg)    Height:        Intake/Output Summary (Last 24 hours) at 11/21/16 1208 Last data filed at 11/21/16 1008  Gross per 24 hour  Intake               510 ml  Output             4425 ml  Net            -3915 ml   Filed Weights   11/19/16 2200 11/20/16 0614 11/21/16 0553  Weight: 173 lb 12.8 oz (78.8 kg) 173 lb (78.5 kg) 165 lb 3.2 oz (74.9 kg)    Physical Exam    GEN: Well nourished, well developed, in no acute distress.  HEENT: Grossly normal.  Neck: Supple, no JVD, carotid bruits, or masses. Cardiac: RRR, 2/6 systolic murmur, no rubs, or gallops. No clubbing, cyanosis, 1-2+ BLE edema.  Radials/DP/PT 2+ and equal bilaterally.  Respiratory:  Respirations regular and unlabored, clear to auscultation bilaterally. GI: Soft, nontender, nondistended, BS + x 4. MS: no deformity or atrophy. Skin: warm and dry, mild rash, right lower extremity above ankle. Neuro:  Strength and sensation are intact. Psych: AAOx3.  Normal affect.  Labs    CBC  Recent Labs  11/19/16 2252 11/21/16 0245  WBC 3.8* 5.8  NEUTROABS 1.3*  --   HGB 10.3* 11.1*  HCT 32.5* 35.3*  MCV 78.7 78.4  PLT 73* 87*  Basic Metabolic Panel  Recent Labs  11/20/16 0458 11/21/16 0245  NA 142 140  K 3.8 3.8  CL 107 101  CO2 28 30  GLUCOSE 113* 154*  BUN 8 9  CREATININE 0.70 0.77  CALCIUM 8.8* 8.8*   Liver Function Tests  Recent Labs  11/19/16 2252  AST 78*  ALT 39  ALKPHOS 116  BILITOT 1.7*  PROT 5.2*  ALBUMIN 2.5*   No results for input(s): LIPASE, AMYLASE in the last 72 hours. Cardiac Enzymes  Recent Labs  11/19/16 2252 11/20/16 0458 11/20/16 1155  TROPONINI <0.03 <0.03 <0.03   Thyroid Function Tests  Recent Labs  11/19/16 2252  TSH 3.084    Telemetry    No adverse rhythms - Personally Reviewed  ECG    NSR, PRWP, no ischemic changes - Personally Reviewed  Radiology    Dg Chest 2 View  Result Date: 11/19/2016 CLINICAL DATA:  Initial evaluation for acute chest pain, shortness of breath. EXAM: CHEST  2 VIEW COMPARISON:  Prior radiograph from 02/02/2015. FINDINGS: Median sternotomy wires underlying CABG markers noted.  Cardiac and mediastinal silhouettes are stable. Lungs are hypoinflated. Bilateral pleural effusions with associated bibasilar opacities, likely atelectasis. No overt pulmonary edema. No other focal infiltrates. No pneumothorax. No acute osseous abnormality. IMPRESSION: 1. Small bilateral pleural effusions, right slightly larger than left. Associated mild bibasilar opacities favored to reflect atelectasis. 2. Sequelae of prior CABG. Electronically Signed   By: Jeannine Boga M.D.   On: 11/19/2016 15:37    Cardiac Studies   NUC stress 05/30/15  Defect 1: There is a medium defect of moderate severity present in the basal inferolateral location.  Defect 2: There is a small defect of moderate severity present in the apical lateral and apex location.  Findings consistent with prior myocardial infarction.  This is an intermediate risk study.  The left ventricular ejection fraction is normal (55-65%).  Abnormal study. Old infarction noted, no ischemia   ECHO 11/20/16 - Left ventricle: The cavity size was normal. Wall thickness was   normal. Systolic function was normal. The estimated ejection   fraction was in the range of 60% to 65%. The study is not   technically sufficient to allow evaluation of LV diastolic   function. - Aortic valve: There was mild stenosis. Valve area (VTI): 0.97   cm^2. Valve area (Vmax): 1.05 cm^2. Valve area (Vmean): 0.98   cm^2. - Mitral valve: Calcified annulus. There was mild regurgitation. - Left atrium: The atrium was mildly dilated. - Atrial septum: No defect or patent foramen ovale was identified. - Impressions: Gradients actually slightly lower than 02/03/15.  Impressions:  - Gradients actually slightly lower than 02/03/15.  Patient Profile     65 year old post CABG with CAD, cirrhosis, normal EF  Assessment & Plan    Acute diastolic heart failure  - increased fluid overload, likely cirrhosis contributing if not the actual cause.   - Agree  with IV lasix 80mg  BID  - Creatinine stable 0.7, potassium 3.8  - Just started spironolactone  - Out 5.7 L, weight on admission 177, current weight 165  Atypical chest pain  - recent NUC reassuring.  - trop normal  - no further testing  CAD post CABG  - as above  Aortic stenosis  - mild   HTN  - metop and ARB  Cirrhosis  - spironolactone 25mg  as well.   Signed, Candee Furbish, MD  11/21/2016, 12:08 PM

## 2016-11-21 NOTE — Progress Notes (Signed)
Inpatient Diabetes Program Recommendations  AACE/ADA: New Consensus Statement on Inpatient Glycemic Control (2015)  Target Ranges:  Prepandial:   less than 140 mg/dL      Peak postprandial:   less than 180 mg/dL (1-2 hours)      Critically ill patients:  140 - 180 mg/dL   Lab Results  Component Value Date   GLUCAP 112 (H) 11/21/2016   HGBA1C 8.6 (H) 11/19/2016    Review of Glycemic Control Results for Denise Cuevas, Denise Cuevas (MRN [id number]) as of 11/21/2016 08:11  Ref. Range 11/20/2016 05:58 11/20/2016 11:44 11/20/2016 16:18 11/20/2016 21:06 11/21/2016 06:02  Glucose-Capillary Latest Ref Range: 65 - 99 mg/dL 151 (H) 391 (H) 209 (H) 254 (H) 112 (H)   Diabetes history: DM2 Outpatient Diabetes medications: Lantus 100 units daily+Invokana 100 ac breakfast + Janumet 50-1gm bid Current orders for Inpatient glycemic control: Lantus 50 units bid +Novolog correction 0-15 units tid + 0-5 units hs  Inpatient Diabetes Program Recommendations:  Noted elevated postprandial CBGs. Please consider: -Add meal coverage 5 units tid if eats 50%  Thank you, Bethena Roys E. Santiel Topper, RN, MSN, CDE Inpatient Glycemic Control Team Team Pager [phone] (8am-5pm) 11/21/2016 8:17 AM

## 2016-11-21 NOTE — Progress Notes (Signed)
Pt refused to have bed alarm on. Pt made aware of safety precautions, will continue to monitor.

## 2016-11-22 LAB — GLUCOSE, CAPILLARY
Glucose-Capillary: 152 mg/dL — ABNORMAL HIGH (ref 65–99)
Glucose-Capillary: 308 mg/dL — ABNORMAL HIGH (ref 65–99)

## 2016-11-22 LAB — BASIC METABOLIC PANEL
ANION GAP: 6 (ref 5–15)
BUN: 9 mg/dL (ref 6–20)
CALCIUM: 8.6 mg/dL — AB (ref 8.9–10.3)
CO2: 29 mmol/L (ref 22–32)
Chloride: 102 mmol/L (ref 101–111)
Creatinine, Ser: 0.8 mg/dL (ref 0.44–1.00)
Glucose, Bld: 153 mg/dL — ABNORMAL HIGH (ref 65–99)
Potassium: 3.8 mmol/L (ref 3.5–5.1)
Sodium: 137 mmol/L (ref 135–145)

## 2016-11-22 MED ORDER — GLUCERNA SHAKE PO LIQD: 237.0000 mL | Freq: Two times a day (BID) | ORAL | Status: DC

## 2016-11-22 MED ORDER — INSULIN ASPART 100 UNIT/ML ~~LOC~~ SOLN
5.0000 [IU] | Freq: Three times a day (TID) | SUBCUTANEOUS | Status: DC
  Administered 2016-11-22 (×2): 5 [IU] via SUBCUTANEOUS

## 2016-11-22 MED ORDER — INSULIN GLARGINE 100 UNIT/ML SOLOSTAR PEN: 50.0000 [IU] | PEN_INJECTOR | Freq: Two times a day (BID) | SUBCUTANEOUS | Status: DC

## 2016-11-22 MED ORDER — HYDROCORTISONE 1 % EX CREA: TOPICAL_CREAM | Freq: Two times a day (BID) | CUTANEOUS | 0 refills | Status: DC

## 2016-11-22 MED ORDER — FUROSEMIDE 40 MG PO TABS: 40.0000 mg | ORAL_TABLET | Freq: Every day | ORAL | 0 refills | Status: DC

## 2016-11-22 MED ORDER — SPIRONOLACTONE 25 MG PO TABS: 25.0000 mg | ORAL_TABLET | Freq: Every day | ORAL | 0 refills | Status: DC

## 2016-11-22 MED ORDER — ADULT MULTIVITAMIN W/MINERALS CH: 1.0000 | ORAL_TABLET | Freq: Every day | ORAL | Status: DC

## 2016-11-22 NOTE — Discharge Instructions (Signed)
Heart Failure °Heart failure is a condition in which the heart has trouble pumping blood because it has become weak or stiff. This means that the heart does not pump blood efficiently for the body to work well. For some people with heart failure, fluid may back up into the lungs and there may be swelling (edema) in the lower legs. Heart failure is usually a long-term (chronic) condition. It is important for you to take good care of yourself and follow the treatment plan from your health care provider. °What are the causes? °This condition is caused by some health problems, including: °· High blood pressure (hypertension). Hypertension causes the heart muscle to work harder than normal. High blood pressure eventually causes the heart to become stiff and weak. °· Coronary artery disease (CAD). CAD is the buildup of cholesterol and fat (plaques) in the arteries of the heart. °· Heart attack (myocardial infarction). Injured tissue, which is caused by the heart attack, does not contract as well and the heart's ability to pump blood is weakened. °· Abnormal heart valves. When the heart valves do not open and close properly, the heart muscle must pump harder to keep the blood flowing. °· Heart muscle disease (cardiomyopathy or myocarditis). Heart muscle disease is damage to the heart muscle from a variety of causes, such as drug or alcohol abuse, infections, or unknown causes. These can increase the risk of heart failure. °· Lung disease. When the lungs do not work properly, the heart must work harder. ° °What increases the risk? °Risk of heart failure increases as a person ages. This condition is also more likely to develop in people who: °· Are overweight. °· Are female. °· Smoke or chew tobacco. °· Abuse alcohol or illegal drugs. °· Have taken medicines that can damage the heart, such as chemotherapy drugs. °· Have diabetes. °? High blood sugar (glucose) is associated with high fat (lipid) levels in the blood. °? Diabetes  can also damage tiny blood vessels that carry nutrients to the heart muscle. °· Have abnormal heart rhythms. °· Have thyroid problems. °· Have low blood counts (anemia). ° °What are the signs or symptoms? °Symptoms of this condition include: °· Shortness of breath with activity, such as when climbing stairs. °· Persistent cough. °· Swelling of the feet, ankles, legs, or abdomen. °· Unexplained weight gain. °· Difficulty breathing when lying flat (orthopnea). °· Waking from sleep because of the need to sit up and get more air. °· Rapid heartbeat. °· Fatigue and loss of energy. °· Feeling light-headed, dizzy, or close to fainting. °· Loss of appetite. °· Nausea. °· Increased urination during the night (nocturia). °· Confusion. ° °How is this diagnosed? °This condition is diagnosed based on: °· Medical history, symptoms, and a physical exam. °· Diagnostic tests, which may include: °? Echocardiogram. °? Electrocardiogram (ECG). °? Chest X-ray. °? Blood tests. °? Exercise stress test. °? Radionuclide scans. °? Cardiac catheterization and angiogram. ° °How is this treated? °Treatment for this condition is aimed at managing the symptoms of heart failure. Medicines, behavioral changes, or other treatments may be necessary to treat heart failure. °Medicines °These may include: °· Angiotensin-converting enzyme (ACE) inhibitors. This type of medicine blocks the effects of a blood protein called angiotensin-converting enzyme. ACE inhibitors relax (dilate) the blood vessels and help to lower blood pressure. °· Angiotensin receptor blockers (ARBs). This type of medicine blocks the actions of a blood protein called angiotensin. ARBs dilate the blood vessels and help to lower blood pressure. °· Water   pills (diuretics). Diuretics cause the kidneys to remove salt and water from the blood. The extra fluid is removed through urination, leaving a lower volume of blood that the heart has to pump. °· Beta blockers. These improve heart  muscle strength and they prevent the heart from beating too quickly. °· Digoxin. This increases the force of the heartbeat. ° °Healthy behavior changes °These may include: °· Reaching and maintaining a healthy weight. °· Stopping smoking or chewing tobacco. °· Eating heart-healthy foods. °· Limiting or avoiding alcohol. °· Stopping use of street drugs (illegal drugs). °· Physical activity. ° °Other treatments °These may include: °· Surgery to open blocked coronary arteries or repair damaged heart valves. °· Placement of a biventricular pacemaker to improve heart muscle function (cardiac resynchronization therapy). This device paces both the right ventricle and left ventricle. °· Placement of a device to treat serious abnormal heart rhythms (implantable cardioverter defibrillator, or ICD). °· Placement of a device to improve the pumping ability of the heart (left ventricular assist device, or LVAD). °· Heart transplant. This can cure heart failure, and it is considered for certain patients who do not improve with other therapies. ° °Follow these instructions at home: °Medicines °· Take over-the-counter and prescription medicines only as told by your health care provider. Medicines are important in reducing the workload of your heart, slowing the progression of heart failure, and improving your symptoms. °? Do not stop taking your medicine unless your health care provider told you to do that. °? Do not skip any dose of medicine. °? Refill your prescriptions before you run out of medicine. You need your medicines every day. °Eating and drinking ° °· Eat heart-healthy foods. Talk with a dietitian to make an eating plan that is right for you. °? Choose foods that contain no trans fat and are low in saturated fat and cholesterol. Healthy choices include fresh or frozen fruits and vegetables, fish, lean meats, legumes, fat-free or low-fat dairy products, and whole-grain or high-fiber foods. °? Limit salt (sodium) if  directed by your health care provider. Sodium restriction may reduce symptoms of heart failure. Ask a dietitian to recommend heart-healthy seasonings. °? Use healthy cooking methods instead of frying. Healthy methods include roasting, grilling, broiling, baking, poaching, steaming, and stir-frying. °· Limit your fluid intake if directed by your health care provider. Fluid restriction may reduce symptoms of heart failure. °Lifestyle °· Stop smoking or using chewing tobacco. Nicotine and tobacco can damage your heart and your blood vessels. Do not use nicotine gum or patches before talking to your health care provider. °· Limit alcohol intake to no more than 1 drink per day for non-pregnant women and 2 drinks per day for men. One drink equals 12 oz of beer, 5 oz of wine, or 1½ oz of hard liquor. °? Drinking more than that is harmful to your heart. Tell your health care provider if you drink alcohol several times a week. °? Talk with your health care provider about whether any level of alcohol use is safe for you. °? If your heart has already been damaged by alcohol or you have severe heart failure, drinking alcohol should be stopped completely. °· Stop use of illegal drugs. °· Lose weight if directed by your health care provider. Weight loss may reduce symptoms of heart failure. °· Do moderate physical activity if directed by your health care provider. People who are elderly and people with severe heart failure should consult with a health care provider for physical activity recommendations. °  Monitor important information °· Weigh yourself every day. Keeping track of your weight daily helps you to notice excess fluid sooner. °? Weigh yourself every morning after you urinate and before you eat breakfast. °? Wear the same amount of clothing each time you weigh yourself. °? Record your daily weight. Provide your health care provider with your weight record. °· Monitor and record your blood pressure as told by your health  care provider. °· Check your pulse as told by your health care provider. °Dealing with extreme temperatures °· If the weather is extremely hot: °? Avoid vigorous physical activity. °? Use air conditioning or fans or seek a cooler location. °? Avoid caffeine and alcohol. °? Wear loose-fitting, lightweight, and light-colored clothing. °· If the weather is extremely cold: °? Avoid vigorous physical activity. °? Layer your clothes. °? Wear mittens or gloves, a hat, and a scarf when you go outside. °? Avoid alcohol. °General instructions °· Manage other health conditions such as hypertension, diabetes, thyroid disease, or abnormal heart rhythms as told by your health care provider. °· Learn to manage stress. If you need help to do this, ask your health care provider. °· Plan rest periods when fatigued. °· Get ongoing education and support as needed. °· Participate in or seek rehabilitation as needed to maintain or improve independence and quality of life. °· Stay up to date with immunizations. Keeping current on pneumococcal and influenza immunizations is especially important to prevent respiratory infections. °· Keep all follow-up visits as told by your health care provider. This is important. °Contact a health care provider if: °· You have a rapid weight gain. °· You have increasing shortness of breath that is unusual for you. °· You are unable to participate in your usual physical activities. °· You tire easily. °· You cough more than normal, especially with physical activity. °· You have any swelling or more swelling in areas such as your hands, feet, ankles, or abdomen. °· You are unable to sleep because it is hard to breathe. °· You feel like your heart is beating quickly (palpitations). °· You become dizzy or light-headed when you stand up. °Get help right away if: °· You have difficulty breathing. °· You notice or your family notices a change in your awareness, such as having trouble staying awake or having  difficulty with concentration. °· You have pain or discomfort in your chest. °· You have an episode of fainting (syncope). °This information is not intended to replace advice given to you by your health care provider. Make sure you discuss any questions you have with your health care provider. °Document Released: 11/25/2005 Document Revised: 07/30/2016 Document Reviewed: 06/19/2016 °[employer] Interactive Patient Education © 2017 [employer] ° °

## 2016-11-22 NOTE — Discharge Summary (Signed)
Physician Discharge Summary  Denise Cuevas [id number] DOB: 1951/09/05  PCP: Antony Blackbird, MD  Outpatient consultants: [name] - Cardiology; Buddy Duty - Endocrinology; [place] - Surgery; [facility] - GI  Admit date: 11/19/2016 Discharge date: 11/22/2016  Recommendations for Outpatient Follow-up:  1. Dr. Sheryn Bison for Dr. Antony Blackbird, PCP on 11/29/16 at 10:30 AM with repeat labs (CBC & BMP). 2. Dr. Candee Furbish, Cardiology: MD's office will arrange follow up in 1 month. 3. Dr. Delrae Rend, Endocrinology: Follow-up in 1 week to reassess poor diabetic control and make necessary adjustments. 4. Recommend repeating chest x-ray in a couple of weeks after diuresis to reassess bilateral pleural effusions.  Home Health: None Equipment/Devices: None    Discharge Condition: Improved and stable  CODE STATUS: Full  Diet recommendation: Heart healthy and diabetic diet.  Discharge Diagnoses:  Principal Problem:   Congestive heart failure (HCC) Active Problems:   Type 2 diabetes mellitus, uncontrolled (HCC)   Hypothyroidism   Heart murmur   Essential hypertension   Cirrhosis of liver not due to alcohol (HCC)   Pancytopenia (HCC)   Protein-calorie malnutrition, severe (HCC)   Acute diastolic CHF (congestive heart failure) ([place])   Brief/Interim Summary: 65 y.o.femalewith medical history significant of CAD s/p CABG, idiopathic cirrhosis of the liver, hypothyroidism, HTN, HLD, fibromyalgia, and DM presented to [facility] ED on 11/19/16 with complaints of worsening dyspnea, lower extremity swelling and some redness, mild nonproductive cough, Central chest pain and dyspnea which worsened when she bent down, lost 40 pounds (intentional) but gained back 17 pounds in the last few weeks. Admitted for decompensated CHF. Cardiology consulted. Improving.   Assessment & Plan:   1. Acute diastolic CHF: Cirrhosis may be contributing to her volume overload. Treated with IV Lasix 80 mg twice a day and Aldactone 25 MG  daily added, with improvement. - 7.6 L since admission. Cardiology follow-up appreciated. Weight on admission 177 pounds, current weight 162 pounds. As discussed with cardiology, DC home on Lasix 40 mg daily and Aldactone 25 MG daily with outpatient follow-up with cardiology in one month. 2. Atypical chest pain: Troponins 4 negative. Chest pain resolved. Likely related to decompensated CHF. Cardiology follow-up appreciated: Recent nuclear stress testing reassuring and no further testing recommended at this time. 3. Essential hypertension: Controlled. Continue metoprolol and ARB. 4. Mild aortic stenosis: Outpatient follow-up. 5. Idiopathic cirrhosis of liver: As per cardiology recommendation, started Aldactone 25 MG daily. 6. CAD status post CABG: Management as above. 7. Uncontrolled DM 2: Held oral medications while inpatient and will be resumed at discharge. Last A1c in 2/16 was 7.9. Repeat A1c: 8.6 suggesting poor outpatient control. Lantus changed from 100 units daily to 50 units twice a day. Fluctuating control. Recommend outpatient follow-up with endocrinology for better control. This was discussed with patient and she verbalizes understanding. 8. Hypothyroid: Continue Synthroid. 9. Pancytopenia: Possibly related to cirrhosis. Stable. Follow CBCs as outpatient.   Consultants:   Cardiology  Procedures:   2-D echo 11/20/16: Study Conclusions  - Left ventricle: The cavity size was normal. Wall thickness was normal. Systolic function was normal. The estimated ejection fraction was in the range of 60% to 65%. The study is not technically sufficient to allow evaluation of LV diastolic function. - Aortic valve: There was mild stenosis. Valve area (VTI): 0.97 cm^2. Valve area (Vmax): 1.05 cm^2. Valve area (Vmean): 0.98 cm^2. - Mitral valve: Calcified annulus. There was mild regurgitation. - Left atrium: The atrium was mildly dilated. - Atrial septum: No defect or patent  foramen ovale  was identified. - Impressions: Gradients actually slightly lower than 02/03/15.  Impressions:  - Gradients actually slightly lower than 02/03/15.  Discharge Instructions  Discharge Instructions    (HEART FAILURE PATIENTS) Call MD:  Anytime you have any of the following symptoms: 1) 3 pound weight gain in 24 hours or 5 pounds in 1 week 2) shortness of breath, with or without a dry hacking cough 3) swelling in the hands, feet or stomach 4) if you have to sleep on extra pillows at night in order to breathe.    Complete by:  As directed    Call MD for:  difficulty breathing, headache or visual disturbances    Complete by:  As directed    Call MD for:  extreme fatigue    Complete by:  As directed    Call MD for:  persistant dizziness or light-headedness    Complete by:  As directed    Call MD for:  severe uncontrolled pain    Complete by:  As directed    Diet - low sodium heart healthy    Complete by:  As directed    Diet Carb Modified    Complete by:  As directed    Increase activity slowly    Complete by:  As directed        Medication List    STOP taking these medications   gabapentin 100 MG capsule Commonly known as:  NEURONTIN   SSD 1 % cream Generic drug:  silver sulfADIAZINE   valACYclovir 1000 MG tablet Commonly known as:  VALTREX     TAKE these medications   albuterol (2.5 MG/3ML) 0.083% nebulizer solution Commonly known as:  PROVENTIL Take 3 mLs (2.5 mg total) by nebulization every 2 (two) hours as needed for wheezing.   albuterol 108 (90 Base) MCG/ACT inhaler Commonly known as:  PROVENTIL HFA;VENTOLIN HFA Inhale 2 puffs into the lungs every 6 (six) hours as needed for wheezing or shortness of breath.   aspirin 81 MG tablet Take 81 mg by mouth daily.   atorvastatin 40 MG tablet Commonly known as:  LIPITOR Take 40 mg by mouth daily.   budesonide-formoterol 80-4.5 MCG/ACT inhaler Commonly known as:  SYMBICORT Inhale 2 puffs into the lungs 2  (two) times daily.   DULoxetine 30 MG capsule Commonly known as:  CYMBALTA Take 30 mg by mouth daily with lunch. In conjunction with one 60 mg capsule to equal a total of 90 milligrams   DULoxetine 60 MG capsule Commonly known as:  CYMBALTA Take 60 mg by mouth daily with lunch. In conjunction with one 30 mg capsule to equal a total of 90 milligrams   feeding supplement (GLUCERNA SHAKE) Liqd Take 237 mLs by mouth 2 (two) times daily between meals. Start taking on:  11/23/2016   furosemide 40 MG tablet Commonly known as:  LASIX Take 1 tablet (40 mg total) by mouth daily.   hydrocortisone cream 1 % Apply topically 2 (two) times daily. Apply to right lower leg   Insulin Glargine 100 UNIT/ML Solostar Pen Commonly known as:  LANTUS SOLOSTAR Inject 50 Units into the skin 2 (two) times daily. What changed:  how much to take  when to take this   INVOKANA 100 MG Tabs tablet Generic drug:  canagliflozin Take 100 mg by mouth daily before breakfast.   levothyroxine 50 MCG tablet Commonly known as:  SYNTHROID, LEVOTHROID Take 50 mcg by mouth daily before breakfast.   metoprolol tartrate 25 MG tablet Commonly known as:  LOPRESSOR Take 12.5 mg by mouth 2 (two) times daily.   multivitamin with minerals Tabs tablet Take 1 tablet by mouth daily. Start taking on:  11/23/2016   nitroGLYCERIN 0.4 MG SL tablet Commonly known as:  NITROSTAT Place 0.4 mg under the tongue every 5 (five) minutes as needed. For chest pain   omeprazole-sodium bicarbonate 40-1100 MG capsule Commonly known as:  ZEGERID Take 1 capsule by mouth daily before breakfast.   oxycodone 5 MG capsule Commonly known as:  OXY-IR Take 5 mg by mouth every 6 (six) hours as needed for pain.   sitaGLIPtin-metformin 50-1000 MG tablet Commonly known as:  JANUMET Take 1 tablet by mouth 2 (two) times daily with a meal.   spironolactone 25 MG tablet Commonly known as:  ALDACTONE Take 1 tablet (25 mg total) by mouth  daily. Start taking on:  11/23/2016   valsartan 80 MG tablet Commonly known as:  DIOVAN Take 1 tablet (80 mg total) by mouth daily.      Follow-up Information    FULP, CAMMIE, MD. Go on 11/29/2016.   Specialty:  Family Medicine Why:  @10 :30AM WITH DR.THACKER. To be seen with repeat labs (CBC & BMP). Contact information: [id number] [place] [address] [phone]        Mark Skains, MD Follow up.   Specialty:  Cardiology Why:  Cardiologist's office will arrange follow-up appointment in 1 month. Contact information: [id number] [place] [address] [phone]        KERR,JEFFREY, MD. Schedule an appointment as soon as possible for a visit in 2 week(s).   Specialty:  Endocrinology Why:  Follow-up regarding better control of your diabetes. Contact information: [address] [place] [address] [phone]          Allergies  Allergen Reactions  . Exenatide Nausea And Vomiting  . Ace Inhibitors Other (See Comments)    pseudoasthma    Procedures/Studies: Dg Chest 2 View  Result Date: 11/19/2016 CLINICAL DATA:  Initial evaluation for acute chest pain, shortness of breath. EXAM: CHEST  2 VIEW COMPARISON:  Prior radiograph from 02/02/2015. FINDINGS: Median sternotomy wires underlying CABG markers noted. Cardiac and mediastinal silhouettes are stable. Lungs are hypoinflated. Bilateral pleural effusions with associated bibasilar opacities, likely atelectasis. No overt pulmonary edema. No other focal infiltrates. No pneumothorax. No acute osseous abnormality. IMPRESSION: 1. Small bilateral pleural effusions, right slightly larger than left. Associated mild bibasilar opacities favored to reflect atelectasis. 2. Sequelae of prior CABG. Electronically Signed   By: Jeannine Boga M.D.   On: 11/19/2016 15:37   US Thyroid  Result Date: 10/24/2016 CLINICAL DATA:  Thyroid nodule felt on physical examination. EXAM: THYROID  ULTRASOUND TECHNIQUE: Ultrasound examination of the thyroid gland and adjacent soft tissues was performed. COMPARISON:  None. FINDINGS: Parenchymal Echotexture: Mildly heterogenous Estimated total number of nodules >/= 1 cm: 0 Number of spongiform nodules >/=  2 cm not described below (TR1): 0 Number of mixed cystic and solid nodules >/= 1.5 cm not described below (TR2): 0 _________________________________________________________ Isthmus: Measures 0.3 cm in thickness. No discrete nodules are identified within the thyroid isthmus. _________________________________________________________ Right lobe: Measures 2.8 x 1.0 x 1.1 cm. No discrete nodules are identified within the right lobe of the thyroid. _________________________________________________________ Left lobe: Measures 3.0 x 0.6 x 1.1 cm. No discrete nodules are identified within the left lobe of the thyroid. IMPRESSION: Normal thyroid ultrasound.  No discrete nodules. Electronically Signed   By: Markus Daft  M.D.   On: 10/24/2016 17:20      Subjective: Feels much better. No dyspnea or chest pain. Leg swelling continues to improve. A rash over right lower leg slowly improving.  Discharge Exam:  Vitals:   11/21/16 1138 11/21/16 1938 11/21/16 2104 11/22/16 0624  BP: 113/61 (!) 109/54  111/63  Pulse: 70 80  70  Resp: 16 18  18   Temp: 98.6 F (37 C) 98.3 F (36.8 C)  98 F (36.7 C)  TempSrc: Oral Oral  Oral  SpO2: 97% 100% 100% 97%  Weight:    73.5 kg (162 lb 1.6 oz)  Height:        General exam: Pleasant middle-aged female Sitting up comfortably in chair. Respiratory system: clear to auscultation.Marland Kitchen Respiratory effort normal. Cardiovascular system: S1 & S2 heard, RRR. No JVD, murmurs, rubs, gallops or clicks. 1+ pedal edema-decreasing. Telemetry: Sinus rhythm. Gastrointestinal system: Abdomen is nondistended, soft and nontender. No organomegaly or masses felt. Normal bowel sounds heard. Central nervous system: Alert and oriented. No  focal neurological deficits. Extremities: Symmetric 5 x 5 power. Skin: Faintly erythematous rash over the right medial lower leg? Contact dermatitis. Improving/stable Psychiatry: Judgement and insight appear normal. Mood & affect appropriate.    The results of significant diagnostics from this hospitalization (including imaging, microbiology, ancillary and laboratory) are listed below for reference.     Microbiology: No results found for this or any previous visit (from the past 240 hour(s)).   Labs: BNP (last 3 results)  Recent Labs  11/19/16 2024  BNP [id number]*   Basic Metabolic Panel:  Recent Labs Lab 11/19/16 1503 11/20/16 0458 11/21/16 0245 11/22/16 0419  NA 135 142 140 137  K 3.8 3.8 3.8 3.8  CL 104 107 101 102  CO2 24 28 30 29   GLUCOSE 310* 113* 154* 153*  BUN 8 8 9 9   CREATININE 0.64 0.70 0.77 0.80  CALCIUM 8.6* 8.8* 8.8* 8.6*   Liver Function Tests:  Recent Labs Lab 11/19/16 2252  AST 78*  ALT 39  ALKPHOS 116  BILITOT 1.7*  PROT 5.2*  ALBUMIN 2.5*   No results for input(s): LIPASE, AMYLASE in the last 168 hours. No results for input(s): AMMONIA in the last 168 hours. CBC:  Recent Labs Lab 11/19/16 1503 11/19/16 2252 11/21/16 0245  WBC 4.2 3.8* 5.8  NEUTROABS  --  1.3*  --   HGB 10.4* 10.3* 11.1*  HCT 33.1* 32.5* 35.3*  MCV 79.0 78.7 78.4  PLT 79* 73* 87*   Cardiac Enzymes:  Recent Labs Lab 11/19/16 2023 11/19/16 2252 11/20/16 0458 11/20/16 1155  TROPONINI <0.03 <0.03 <0.03 <0.03   BNP: Invalid input(s): POCBNP CBG:  Recent Labs Lab 11/21/16 1422 11/21/16 1646 11/21/16 2058 11/22/16 0622 11/22/16 1201  GLUCAP 240* 389* 253* 152* 308*    Hgb A1c  Recent Labs  11/19/16 2252  HGBA1C 8.6*   Thyroid function studies  Recent Labs  11/19/16 2252  TSH 3.084      Time coordinating discharge: Over 30 minutes  SIGNED:  Vernell Leep, MD, FACP, [place]. [facility] Pager [phone]  If 7PM-7AM, please  contact night-coverage [email or web address] Password [facility] 11/22/2016, 12:47 PM

## 2016-11-22 NOTE — Progress Notes (Signed)
Patient discharge education provided at bedside. Pt IV discontinued and catheter intact, telemetry removed. All belongings in hand and paperwork. Discharged via wheelchair with volunteer.  Denise Cuevas

## 2016-11-22 NOTE — Progress Notes (Signed)
Patient Name: Denise Cuevas Date of Encounter: 11/22/2016  Primary Cardiologist: [facility] Problem List     Principal Problem:   Congestive heart failure [facility]) Active Problems:   Type 2 diabetes mellitus, uncontrolled (HCC)   Hypothyroidism   Heart murmur   Essential hypertension   Cirrhosis of liver not due to alcohol (HCC)   Pancytopenia (HCC)   Protein-calorie malnutrition, severe (HCC)   Acute diastolic CHF (congestive heart failure) ([place])     Subjective   Feels better. No CP. No SOB.    Inpatient Medications    Scheduled Meds: . aspirin EC  81 mg Oral Daily  . atorvastatin  40 mg Oral Daily  . DULoxetine  90 mg Oral Q lunch  . feeding supplement (GLUCERNA SHAKE)  237 mL Oral BID BM  . furosemide  80 mg Intravenous BID  . hydrocortisone cream   Topical BID  . insulin aspart  0-15 Units Subcutaneous TID WC  . insulin aspart  0-5 Units Subcutaneous QHS  . insulin aspart  5 Units Subcutaneous TID WC  . insulin glargine  50 Units Subcutaneous BID  . irbesartan  75 mg Oral Daily  . levothyroxine  50 mcg Oral QAC breakfast  . metoprolol tartrate  12.5 mg Oral BID  . mometasone-formoterol  2 puff Inhalation BID  . multivitamin with minerals  1 tablet Oral Daily  . pantoprazole  80 mg Oral Daily  . sodium chloride flush  3 mL Intravenous Q12H  . spironolactone  25 mg Oral Daily  . valACYclovir  1,000 mg Oral BID   Continuous Infusions:  PRN Meds: sodium chloride, acetaminophen, albuterol, gabapentin, ondansetron (ZOFRAN) IV, oxyCODONE, sodium chloride flush   Vital Signs    Vitals:   11/21/16 1138 11/21/16 1938 11/21/16 2104 11/22/16 0624  BP: 113/61 (!) 109/54  111/63  Pulse: 70 80  70  Resp: 16 18  18   Temp: 98.6 F (37 C) 98.3 F (36.8 C)  98 F (36.7 C)  TempSrc: Oral Oral  Oral  SpO2: 97% 100% 100% 97%  Weight:    162 lb 1.6 oz (73.5 kg)  Height:        Intake/Output Summary (Last 24 hours) at 11/22/16 1004 Last data filed at  11/22/16 0900  Gross per 24 hour  Intake             1060 ml  Output             3050 ml  Net            -1990 ml   Filed Weights   11/20/16 0614 11/21/16 0553 11/22/16 0624  Weight: 173 lb (78.5 kg) 165 lb 3.2 oz (74.9 kg) 162 lb 1.6 oz (73.5 kg)    Physical Exam    GEN: Well nourished, well developed, in no acute distress.  HEENT: Grossly normal.  Neck: Supple, no JVD, carotid bruits, or masses. Cardiac: RRR, 2/6 systolic murmur, no rubs, or gallops. No clubbing, cyanosis, 1-2+ BLE edema.  Radials/DP/PT 2+ and equal bilaterally.  Respiratory:  Respirations regular and unlabored, clear to auscultation bilaterally. GI: Soft, nontender, nondistended, BS + x 4. MS: no deformity or atrophy. Skin: warm and dry, mild rash, right lower extremity above ankle. Neuro:  Strength and sensation are intact. Psych: AAOx3.  Normal affect.  Labs    CBC  Recent Labs  11/19/16 2252 11/21/16 0245  WBC 3.8* 5.8  NEUTROABS 1.3*  --   HGB 10.3* 11.1*  HCT  32.5* 35.3*  MCV 78.7 78.4  PLT 73* 87*   Basic Metabolic Panel  Recent Labs  11/21/16 0245 11/22/16 0419  NA 140 137  K 3.8 3.8  CL 101 102  CO2 30 29  GLUCOSE 154* 153*  BUN 9 9  CREATININE 0.77 0.80  CALCIUM 8.8* 8.6*   Liver Function Tests  Recent Labs  11/19/16 2252  AST 78*  ALT 39  ALKPHOS 116  BILITOT 1.7*  PROT 5.2*  ALBUMIN 2.5*   No results for input(s): LIPASE, AMYLASE in the last 72 hours. Cardiac Enzymes  Recent Labs  11/19/16 2252 11/20/16 0458 11/20/16 1155  TROPONINI <0.03 <0.03 <0.03   Thyroid Function Tests  Recent Labs  11/19/16 2252  TSH 3.084    Telemetry    No adverse rhythms - Personally Reviewed  ECG    NSR, PRWP, no ischemic changes - Personally Reviewed  Radiology    No results found.  Cardiac Studies   NUC stress 05/30/15  Defect 1: There is a medium defect of moderate severity present in the basal inferolateral location.  Defect 2: There is a small defect of  moderate severity present in the apical lateral and apex location.  Findings consistent with prior myocardial infarction.  This is an intermediate risk study.  The left ventricular ejection fraction is normal (55-65%).  Abnormal study. Old infarction noted, no ischemia   ECHO 11/20/16 - Left ventricle: The cavity size was normal. Wall thickness was   normal. Systolic function was normal. The estimated ejection   fraction was in the range of 60% to 65%. The study is not   technically sufficient to allow evaluation of LV diastolic   function. - Aortic valve: There was mild stenosis. Valve area (VTI): 0.97   cm^2. Valve area (Vmax): 1.05 cm^2. Valve area (Vmean): 0.98   cm^2. - Mitral valve: Calcified annulus. There was mild regurgitation. - Left atrium: The atrium was mildly dilated. - Atrial septum: No defect or patent foramen ovale was identified. - Impressions: Gradients actually slightly lower than 02/03/15.  Impressions:  - Gradients actually slightly lower than 02/03/15.  Patient Profile     65 year old post CABG with CAD, cirrhosis, normal EF  Assessment & Plan    Acute diastolic heart failure  - increased fluid overload, likely cirrhosis contributing if not the actual cause.   -  IV lasix 80mg  BID, change to lasix 40mg  PO QD.   - Creatinine stable  - spironolactone  - Out 7.6 L, weight on admission 177, current weight 162  -   Atypical chest pain  - recent NUC reassuring.  - trop normal  - no further testing  CAD post CABG  - as above  Aortic stenosis  - mild   HTN  - metop and ARB  Cirrhosis  - spironolactone 25mg  as well.   Will arrange follow up in 1 month in our clinic.   Signed, Candee Furbish, MD  11/22/2016, 10:04 AM

## 2016-12-20 ENCOUNTER — Ambulatory Visit (INDEPENDENT_AMBULATORY_CARE_PROVIDER_SITE_OTHER): Payer: Medicare Other | Admitting: Cardiology

## 2016-12-20 ENCOUNTER — Encounter: Payer: Self-pay | Admitting: Cardiology

## 2016-12-20 VITALS — BP 118/68 | HR 90 | Ht 62.0 in | Wt 165.0 lb

## 2016-12-20 DIAGNOSIS — I25118 Atherosclerotic heart disease of native coronary artery with other forms of angina pectoris: Secondary | ICD-10-CM | POA: Diagnosis not present

## 2016-12-20 DIAGNOSIS — R413 Other amnesia: Secondary | ICD-10-CM

## 2016-12-20 DIAGNOSIS — R0600 Dyspnea, unspecified: Secondary | ICD-10-CM

## 2016-12-20 DIAGNOSIS — M79606 Pain in leg, unspecified: Secondary | ICD-10-CM | POA: Diagnosis not present

## 2016-12-20 DIAGNOSIS — R011 Cardiac murmur, unspecified: Secondary | ICD-10-CM | POA: Diagnosis not present

## 2016-12-20 DIAGNOSIS — I350 Nonrheumatic aortic (valve) stenosis: Secondary | ICD-10-CM

## 2016-12-20 NOTE — Patient Instructions (Signed)
Medication Instructions:  The current medical regimen is effective;  continue present plan and medications.  Testing/Procedures: Your physician has requested that you have an echocardiogram. Echocardiography is a painless test that uses sound waves to create images of your heart. It provides your doctor with information about the size and shape of your heart and how well your heart's chambers and valves are working. This procedure takes approximately one hour. There are no restrictions for this procedure.  Your physician has requested that you have a lower extremity arterial exercise duplex. During this test, exercise and ultrasound are used to evaluate arterial blood flow in the legs. Allow one hour for this exam. There are no restrictions or special instructions.  Follow-Up: Follow up as needed after testing.  Thank you for choosing [facility]!!

## 2016-12-20 NOTE — Progress Notes (Addendum)
[place]. [address], [address] [place], [place]  [address] Phone: [phone] Fax:  [phone]  Date:  12/20/2016   ID:  Denise Cuevas, DOB 20-Jan-1951, MRN [id number]  PCP:  Cammy Copa, MD   History of Present Illness: Denise Cuevas is a 66 y.o. female with coronary artery disease status post bypass surgery here for evaluation of shortness of breath.  CABG 11/2011, LIMA to LAD, SVG to obtuse marginal with catheterization in 2013 showing 95% SVG to obtuse marginal at the distal insertion. Underwent drug-eluting stent to the proximal circumflex on 03/24/12. She was then taken back to the cath lab again on 05/06/12 and had DES stent placement to distal LAD. Widely patent RCA.  Echocardiogram on 01/2015 showed normal LV function at 60-65%.  Her Advair and lisinopril were stopped by Dr. Melvyn Novas he started her on valsartan 80 mg once a day.  She saw [facility] on 05/29/15 with chest tightness, memory issues. Thankfully, nuclear stress test was reassuring.  Today she states that she is having cramping at night bilateral lower extremities. Feels poor from the waist down. No chest pain. Mild shortness of breath.  [address] [place]   Wt Readings from Last 3 Encounters:  12/20/16 165 lb (74.8 kg)  11/22/16 162 lb 1.6 oz (73.5 kg)  10/16/16 160 lb (72.6 kg)     Past Medical History:  Diagnosis Date  . Angina   . Asthma   . 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 Coronary artery disease    s/p CABG  . Depression   . Diabetes mellitus    type 2  . Edema extremities    consistent edema lower legs, feet, and right quadrant abdominal pain-"goes and comes"  . Fibromyalgia   . GERD (gastroesophageal reflux disease)   . H/O hiatal hernia   . Headache(784.0)    occ. generalized  . Heart murmur   . Hypercholesteremia   . Hypertension   . Hypothyroid   . Myocardial infarction 11/2011   MI x2- [id number] coronary stent(chest pain episode at time)  . PONV  (postoperative nausea and vomiting)     Past Surgical History:  Procedure Laterality Date  . CHOLECYSTECTOMY     open many yrs ago  . COLONOSCOPY WITH PROPOFOL N/A 08/24/2014   Procedure: COLONOSCOPY WITH PROPOFOL;  Surgeon: Arta Silence, MD;  Location: [facility];  Service: Endoscopy;  Laterality: N/A;  . CORONARY ANGIOPLASTY WITH STENT PLACEMENT  05/06/2012  . CORONARY ARTERY BYPASS GRAFT  11/27/2011   Procedure: CORONARY ARTERY BYPASS GRAFTING (CABG);  Surgeon: [name], MD;  Location: [facility];  Service: Open Heart Surgery;  Laterality: N/A;  coronary artery bypass graft on pump times two utilizing left internal mammary artery and right saphenous vein harvested endoscopically   . ESOPHAGOGASTRODUODENOSCOPY (EGD) WITH PROPOFOL N/A 08/24/2014   Procedure: ESOPHAGOGASTRODUODENOSCOPY (EGD) WITH PROPOFOL;  Surgeon: Arta Silence, MD;  Location: [facility];  Service: Endoscopy;  Laterality: N/A;  . KNEE SURGERY Right    scope surgery  . LEFT HEART CATHETERIZATION WITH CORONARY ANGIOGRAM N/A 11/19/2011   Procedure: LEFT HEART CATHETERIZATION WITH CORONARY ANGIOGRAM;  Surgeon: Candee Furbish, MD;  Location: [facility] CATH LAB;  Service: Cardiovascular;  Laterality: N/A;  . LEFT HEART CATHETERIZATION WITH CORONARY ANGIOGRAM  05/06/2012   Procedure: LEFT HEART CATHETERIZATION WITH CORONARY ANGIOGRAM;  Surgeon: Jettie Booze, MD;  Location: [facility] CATH LAB;  Service: Cardiovascular;;  . LEFT HEART CATHETERIZATION WITH CORONARY/GRAFT ANGIOGRAM  N/A 03/24/2012   Procedure: LEFT HEART CATHETERIZATION WITH Beatrix Fetters;  Surgeon: Jettie Booze, MD;  Location: [facility] CATH LAB;  Service: Cardiovascular;  Laterality: N/A;  . PERCUTANEOUS CORONARY STENT INTERVENTION (PCI-S)  03/24/2012   Procedure: PERCUTANEOUS CORONARY STENT INTERVENTION (PCI-S);  Surgeon: Jettie Booze, MD;  Location: [facility] CATH LAB;  Service: Cardiovascular;;  . PERCUTANEOUS CORONARY STENT INTERVENTION (PCI-S) Bilateral  05/06/2012   Procedure: PERCUTANEOUS CORONARY STENT INTERVENTION (PCI-S);  Surgeon: Jettie Booze, MD;  Location: [facility] CATH LAB;  Service: Cardiovascular;  Laterality: Bilateral;    Current Outpatient Prescriptions  Medication Sig Dispense Refill  . albuterol (PROVENTIL HFA;VENTOLIN HFA) 108 (90 BASE) MCG/ACT inhaler Inhale 2 puffs into the lungs every 6 (six) hours as needed for wheezing or shortness of breath. 1 Inhaler 2  . albuterol (PROVENTIL) (2.5 MG/3ML) 0.083% nebulizer solution Take 3 mLs (2.5 mg total) by nebulization every 2 (two) hours as needed for wheezing. 75 mL 0  . aspirin 81 MG tablet Take 81 mg by mouth daily.    Marland Kitchen atorvastatin (LIPITOR) 40 MG tablet Take 40 mg by mouth daily.    . budesonide-formoterol (SYMBICORT) 80-4.5 MCG/ACT inhaler Inhale 2 puffs into the lungs 2 (two) times daily.    . canagliflozin (INVOKANA) 100 MG TABS tablet Take 100 mg by mouth daily before breakfast.    . DULoxetine (CYMBALTA) 30 MG capsule Take 30 mg by mouth daily with lunch. In conjunction with one 60 mg capsule to equal a total of 90 milligrams    . DULoxetine (CYMBALTA) 60 MG capsule Take 60 mg by mouth daily with lunch. In conjunction with one 30 mg capsule to equal a total of 90 milligrams    . feeding supplement, GLUCERNA SHAKE, (GLUCERNA SHAKE) LIQD Take 237 mLs by mouth 2 (two) times daily between meals.    . furosemide (LASIX) 40 MG tablet Take 1 tablet (40 mg total) by mouth daily. 30 tablet 0  . hydrocortisone cream 1 % Apply topically 2 (two) times daily. Apply to right lower leg 30 g 0  . Insulin Glargine (LANTUS SOLOSTAR) 100 UNIT/ML Solostar Pen Inject 50 Units into the skin 2 (two) times daily.    Marland Kitchen levothyroxine (SYNTHROID, LEVOTHROID) 50 MCG tablet Take 50 mcg by mouth daily before breakfast.    . metoprolol tartrate (LOPRESSOR) 25 MG tablet Take 12.5 mg by mouth 2 (two) times daily.     . Multiple Vitamin (MULTIVITAMIN WITH MINERALS) TABS tablet Take 1 tablet by mouth daily.     . nitroGLYCERIN (NITROSTAT) 0.4 MG SL tablet Place 0.4 mg under the tongue every 5 (five) minutes as needed. For chest pain    . omeprazole-sodium bicarbonate (ZEGERID) 40-1100 MG per capsule Take 1 capsule by mouth daily before breakfast.    . oxycodone (OXY-IR) 5 MG capsule Take 5 mg by mouth every 6 (six) hours as needed for pain.     . sitaGLIPtin-metformin (JANUMET) 50-1000 MG per tablet Take 1 tablet by mouth 2 (two) times daily with a meal.    . spironolactone (ALDACTONE) 25 MG tablet Take 1 tablet (25 mg total) by mouth daily. 30 tablet 0  . valsartan (DIOVAN) 80 MG tablet Take 1 tablet (80 mg total) by mouth daily. 30 tablet 0   No current facility-administered medications for this visit.     Allergies:    Allergies  Allergen Reactions  . Exenatide Nausea And Vomiting  . Ace Inhibitors Other (See Comments)    pseudoasthma  Social History:  The patient  reports that she has never smoked. She has never used smokeless tobacco. She reports that she does not drink alcohol or use drugs.   ROS:  Please see the history of present illness.   No chest pain, no current shortness of breath, no fevers, no chills.    PHYSICAL EXAM: VS:  BP 118/68   Pulse 90   Ht 5\' 2"  (1.575 m)   Wt 165 lb (74.8 kg)   LMP  (LMP Unknown)   BMI 30.18 kg/m  Well nourished, well developed, in no acute distress  HEENT: normal bilateral carotid bruits, radiation murmur Neck: no JVD  Cardiac:  normal S1, S2; RRR; 2/6 systolic murmur Scar noted. Lungs:  clear to auscultation bilaterally, no wheezing, rhonchi or rales  Abd: soft, nontender, no hepatomegaly  Ext: no edema, difficult to palpate distal pulses Skin: warm and dry  Neuro: no focal abnormalities noted  EKG:  Reviewed from emergency room 10/14/13-no ST segment changes     Cath 03/24/12 IMPRESSIONS:  Patent RCA, LIMA to distal LAD, large first diagonal from the native proximal LAD. The mid LAD is occluded. Ostial 95% stenosis in the SVG  to circumflex. 90% stenosis in the insertion of the SVG to OM which extended into the anterograde and retrograde branches. DES to proximal circumflex with a 2.75 x 32 Promus stent, postdilated to > 3.0 mm in diameter. DES to OM1 with a 2.5 x 24 Promus. TIMI 3 flow restored. Normal right coronary artery. There was some catheter-induced spasm noted in the RCA. Low normal left ventricular systolic function. LVEDP 22 mmHg. Ejection fraction 50%. Echo 02/03/15: - Left ventricle: The cavity size was normal. Wall thickness was normal. Systolic function was normal. The estimated ejection fraction was in the range of 60% to 65%. - Aortic valve: AV is thckened, calcified with mildly restricted motion. Peak and mean gradients through the valve are 35 and 19 mm Hg respectively consistent with mild AS. There was trivial regurgitation. - Mitral valve: There was mild regurgitation. - Left atrium: The atrium was mildly dilated - 03/20/2015 Walked RA x 3 laps @ 185 ft each stopped due to end of study, mild chest discomfort/sob but no desat at nl pace  - trial off acei 03/20/2015 > all symptoms resolved at f/u ov 04/20/2015 > pulmonary f/u prn   Stress test 11/2012: IMPRESSION: No evidence for pharmacologically induced ischemia.  Calculated ejection fraction is 73%.  NUC stress 05/30/15  Defect 1: There is a medium defect of moderate severity present in the basal inferolateral location.  Defect 2: There is a small defect of moderate severity present in the apical lateral and apex location.  Findings consistent with prior myocardial infarction.  This is an intermediate risk study.  The left ventricular ejection fraction is normal (55-65%).  Abnormal study. Old infarction noted, no ischemia  ASSESSMENT AND PLAN:  1. Dyspnea-no major cardiac cause. Reassuring nuclear stress test as above. We will check an echocardiogram to ensure that her uric stenosis is not progressed. 2. Aortic  stenosis-previously described as mild. Checking echocardiogram. 3. Coronary artery disease-no current anginal symptoms. Reassuring workup in the ER previously. 4. Anxiety-unfortunately she has been staying home more and more. I asked her to continue to discuss this with Dr. Sheryn Bison. Sometimes she is in the appearance of looking white as a ghost-possible vagal. 5. Obesity-encourage weight loss. 6. We will follow-up results of echocardiogram and lower extremity Dopplers. She is following up with Dr. Herbie Baltimore  [place]  Signed, Candee Furbish, MD [facility]  12/20/2016 2:19 PM

## 2016-12-26 ENCOUNTER — Other Ambulatory Visit (HOSPITAL_COMMUNITY): Payer: Medicare Other

## 2016-12-31 ENCOUNTER — Other Ambulatory Visit: Payer: Self-pay | Admitting: Cardiology

## 2016-12-31 DIAGNOSIS — R0989 Other specified symptoms and signs involving the circulatory and respiratory systems: Secondary | ICD-10-CM

## 2017-01-02 ENCOUNTER — Ambulatory Visit (HOSPITAL_COMMUNITY)
Admission: RE | Admit: 2017-01-02 | Discharge: 2017-01-02 | Disposition: A | Discharge status: Home / Self Care | Payer: Medicare Other | Source: Ambulatory Visit | Attending: Cardiology | Admitting: Cardiology

## 2017-01-02 ENCOUNTER — Telehealth: Payer: Self-pay | Admitting: Cardiology

## 2017-01-02 ENCOUNTER — Encounter (HOSPITAL_COMMUNITY): Payer: Self-pay

## 2017-01-02 DIAGNOSIS — R011 Cardiac murmur, unspecified: Secondary | ICD-10-CM

## 2017-01-02 NOTE — Telephone Encounter (Signed)
Jonni Sanger called from [facility] echo, pt was there for an Echo, ov notes makes note that her last one was 01-2015, and she had one done 11-20-16, she was sent home and was told we would call her back if she needed to RS-pls advise pt

## 2017-01-02 NOTE — Progress Notes (Signed)
*  PRELIMINARY RESULTS* Echocardiogram 2D Echocardiogram was not performed. Patient had an echocardiogram while in the hospital on 11/20/2016. Dr. [name]' office was notified and Devra Dopp, RN stated not to do echocardiogram scheduled for today.  Denise Cuevas 01/02/2017, 1:27 PM

## 2017-01-03 NOTE — Telephone Encounter (Signed)
Agree. She did just have echo and this was reassuring. No need to repeat. Thank you for notifying me of this.  Candee Furbish, MD

## 2017-01-07 ENCOUNTER — Other Ambulatory Visit: Payer: Self-pay | Admitting: Cardiology

## 2017-01-07 ENCOUNTER — Ambulatory Visit (HOSPITAL_COMMUNITY)
Admission: RE | Admit: 2017-01-07 | Discharge: 2017-01-07 | Disposition: A | Discharge status: Home / Self Care | Payer: Medicare Other | Source: Ambulatory Visit | Attending: Cardiology | Admitting: Cardiology

## 2017-01-07 DIAGNOSIS — M79606 Pain in leg, unspecified: Secondary | ICD-10-CM

## 2017-01-07 DIAGNOSIS — R0989 Other specified symptoms and signs involving the circulatory and respiratory systems: Secondary | ICD-10-CM | POA: Insufficient documentation

## 2017-01-07 DIAGNOSIS — I739 Peripheral vascular disease, unspecified: Secondary | ICD-10-CM

## 2017-01-28 ENCOUNTER — Encounter: Payer: Self-pay | Admitting: Podiatry

## 2017-01-28 ENCOUNTER — Ambulatory Visit (INDEPENDENT_AMBULATORY_CARE_PROVIDER_SITE_OTHER): Payer: Medicare Other | Admitting: Podiatry

## 2017-01-28 VITALS — BP 106/51 | HR 79 | Resp 14

## 2017-01-28 DIAGNOSIS — E1151 Type 2 diabetes mellitus with diabetic peripheral angiopathy without gangrene: Secondary | ICD-10-CM | POA: Diagnosis not present

## 2017-01-28 DIAGNOSIS — Q828 Other specified congenital malformations of skin: Secondary | ICD-10-CM

## 2017-01-28 NOTE — Progress Notes (Signed)
   Subjective:    Patient ID: Denise Cuevas, female    DOB: 08-07-1951, 66 y.o.   MRN: [id number]  HPI   This patient presents today complaining of approximately three-month history of a painful skin lesion on the plantar aspect left foot aggravated with standing walking and relieved with rest and elevation. Patient has apply topical antibiotic ointment and skin moisturizing cream to the area without any improvement.  Patient is history of diabetes approximately 10 years. Denies history of foot ulceration. History of claudication No history of amputation  Patient denies history of smoking   Review of Systems  All other systems reviewed and are negative.      Objective:   Physical Exam Patient estimates 5 foot 2 inches and 172 pounds  Orientated 3  Vascular: DP and PT pulses 1/4 bilaterally Capillary reflex delay bilaterally  Neurological: Sensation to 10 g monofilament wire intact 5/5 bilaterally Vibratory sensation reactive bilaterally Ankle reflexes reactive bilaterally  Dermatological: No open skin lesions bilaterally Nucleated plantar keratoses base of fifth left metatarsal. There is no surrounding erythema, edema, warmth Diffuse plantar keratoses plantar distal MPJ 2-4 left  Musculoskeletal: Manual motor testing dorsi flexion, plantar flexion, inversion, eversion 5/5 bilaterally       Assessment & Plan:   Assessment: Diabetic with peripheral arterial disease associated with diminished pedal pulses Protective sensation intact Porokeratosis 1  Plan: Reviewed the results of exam and informed patient that the skin lesion was a chronic skinlike growth and needed periodic trimming. Patient verbally consents Porokeratosis on the plantar aspect left foot was debrided without any bleeding  Reappoint at patient's request or yearly

## 2017-01-28 NOTE — Patient Instructions (Signed)
The skin growth on the bottom of the left foot is a corn-like growth, porokeratosis. I trim this back to reduce the thickness. This will provide temporary relief, however, this corn will recur and return as needed for trimming  Diabetes and Foot Care Diabetes may cause you to have problems because of poor blood supply (circulation) to your feet and legs. This may cause the skin on your feet to become thinner, break easier, and heal more slowly. Your skin may become dry, and the skin may peel and crack. You may also have nerve damage in your legs and feet causing decreased feeling in them. You may not notice minor injuries to your feet that could lead to infections or more serious problems. Taking care of your feet is one of the most important things you can do for yourself. Follow these instructions at home:  Wear shoes at all times, even in the house. Do not go barefoot. Bare feet are easily injured.  Check your feet daily for blisters, cuts, and redness. If you cannot see the bottom of your feet, use a mirror or ask someone for help.  Wash your feet with warm water (do not use hot water) and mild soap. Then pat your feet and the areas between your toes until they are completely dry. Do not soak your feet as this can dry your skin.  Apply a moisturizing lotion or petroleum jelly (that does not contain alcohol and is unscented) to the skin on your feet and to dry, brittle toenails. Do not apply lotion between your toes.  Trim your toenails straight across. Do not dig under them or around the cuticle. File the edges of your nails with an emery board or nail file.  Do not cut corns or calluses or try to remove them with medicine.  Wear clean socks or stockings every day. Make sure they are not too tight. Do not wear knee-high stockings since they may decrease blood flow to your legs.  Wear shoes that fit properly and have enough cushioning. To break in new shoes, wear them for just a few hours a  day. This prevents you from injuring your feet. Always look in your shoes before you put them on to be sure there are no objects inside.  Do not cross your legs. This may decrease the blood flow to your feet.  If you find a minor scrape, cut, or break in the skin on your feet, keep it and the skin around it clean and dry. These areas may be cleansed with mild soap and water. Do not cleanse the area with peroxide, alcohol, or iodine.  When you remove an adhesive bandage, be sure not to damage the skin around it.  If you have a wound, look at it several times a day to make sure it is healing.  Do not use heating pads or hot water bottles. They may burn your skin. If you have lost feeling in your feet or legs, you may not know it is happening until it is too late.  Make sure your health care provider performs a complete foot exam at least annually or more often if you have foot problems. Report any cuts, sores, or bruises to your health care provider immediately. Contact a health care provider if:  You have an injury that is not healing.  You have cuts or breaks in the skin.  You have an ingrown nail.  You notice redness on your legs or feet.  You feel burning  or tingling in your legs or feet.  You have pain or cramps in your legs and feet.  Your legs or feet are numb.  Your feet always feel cold. Get help right away if:  There is increasing redness, swelling, or pain in or around a wound.  There is a red line that goes up your leg.  Pus is coming from a wound.  You develop a fever or as directed by your health care provider.  You notice a bad smell coming from an ulcer or wound. This information is not intended to replace advice given to you by your health care provider. Make sure you discuss any questions you have with your health care provider. Document Released: 11/22/2000 Document Revised: 05/02/2016 Document Reviewed: 05/04/2013 Elsevier Interactive Patient Education   2017 [employer].

## 2017-02-13 ENCOUNTER — Encounter: Payer: Self-pay | Admitting: Cardiology

## 2017-02-13 ENCOUNTER — Telehealth: Payer: Self-pay | Admitting: *Deleted

## 2017-02-13 NOTE — Telephone Encounter (Signed)
A message was left today for Denise Cuevas to call and reschedule her echocardiogram and a letter mailed.She has cancel and bo showed.

## 2017-03-10 ENCOUNTER — Emergency Department (HOSPITAL_COMMUNITY): Payer: Medicare Other

## 2017-03-10 ENCOUNTER — Emergency Department (HOSPITAL_COMMUNITY)
Admission: EM | Admit: 2017-03-10 | Discharge: 2017-03-11 | Disposition: A | Discharge status: Home / Self Care | Payer: Medicare Other | Attending: Emergency Medicine | Admitting: Emergency Medicine

## 2017-03-10 ENCOUNTER — Encounter (HOSPITAL_COMMUNITY): Payer: Self-pay

## 2017-03-10 DIAGNOSIS — B9789 Other viral agents as the cause of diseases classified elsewhere: Secondary | ICD-10-CM

## 2017-03-10 DIAGNOSIS — J40 Bronchitis, not specified as acute or chronic: Secondary | ICD-10-CM

## 2017-03-10 DIAGNOSIS — Z7982 Long term (current) use of aspirin: Secondary | ICD-10-CM | POA: Diagnosis not present

## 2017-03-10 DIAGNOSIS — J069 Acute upper respiratory infection, unspecified: Secondary | ICD-10-CM | POA: Diagnosis not present

## 2017-03-10 DIAGNOSIS — E119 Type 2 diabetes mellitus without complications: Secondary | ICD-10-CM | POA: Diagnosis not present

## 2017-03-10 DIAGNOSIS — Z794 Long term (current) use of insulin: Secondary | ICD-10-CM | POA: Insufficient documentation

## 2017-03-10 DIAGNOSIS — R059 Cough, unspecified: Secondary | ICD-10-CM

## 2017-03-10 DIAGNOSIS — J209 Acute bronchitis, unspecified: Secondary | ICD-10-CM | POA: Insufficient documentation

## 2017-03-10 DIAGNOSIS — R05 Cough: Secondary | ICD-10-CM | POA: Diagnosis present

## 2017-03-10 DIAGNOSIS — I110 Hypertensive heart disease with heart failure: Secondary | ICD-10-CM | POA: Diagnosis not present

## 2017-03-10 DIAGNOSIS — J45909 Unspecified asthma, uncomplicated: Secondary | ICD-10-CM | POA: Diagnosis not present

## 2017-03-10 DIAGNOSIS — I2510 Atherosclerotic heart disease of native coronary artery without angina pectoris: Secondary | ICD-10-CM | POA: Insufficient documentation

## 2017-03-10 DIAGNOSIS — I5031 Acute diastolic (congestive) heart failure: Secondary | ICD-10-CM | POA: Insufficient documentation

## 2017-03-10 DIAGNOSIS — I252 Old myocardial infarction: Secondary | ICD-10-CM | POA: Diagnosis not present

## 2017-03-10 DIAGNOSIS — Z79899 Other long term (current) drug therapy: Secondary | ICD-10-CM | POA: Insufficient documentation

## 2017-03-10 DIAGNOSIS — Z951 Presence of aortocoronary bypass graft: Secondary | ICD-10-CM | POA: Insufficient documentation

## 2017-03-10 DIAGNOSIS — E039 Hypothyroidism, unspecified: Secondary | ICD-10-CM | POA: Diagnosis not present

## 2017-03-10 LAB — BASIC METABOLIC PANEL
Anion gap: 9 (ref 5–15)
BUN: 7 mg/dL (ref 6–20)
CO2: 24 mmol/L (ref 22–32)
Calcium: 8.4 mg/dL — ABNORMAL LOW (ref 8.9–10.3)
Chloride: 104 mmol/L (ref 101–111)
Creatinine, Ser: 0.82 mg/dL (ref 0.44–1.00)
GFR calc Af Amer: >60 mL/min (ref >60)
GFR calc non Af Amer: >60 mL/min (ref >60)
Glucose, Bld: 318 mg/dL — ABNORMAL HIGH (ref 65–99)
Potassium: 3.5 mmol/L (ref 3.5–5.1)
Sodium: 137 mmol/L (ref 135–145)

## 2017-03-10 LAB — CBC WITH DIFFERENTIAL/PLATELET
Basophils Absolute: 0 10*3/uL (ref 0.0–0.1)
Basophils Relative: 1 %
Eosinophils Absolute: 0.1 10*3/uL (ref 0.0–0.7)
Eosinophils Relative: 3 %
HCT: 30.2 % — ABNORMAL LOW (ref 36.0–46.0)
Hemoglobin: 9.7 g/dL — ABNORMAL LOW (ref 12.0–15.0)
Lymphocytes Relative: 43 %
Lymphs Abs: 1.5 10*3/uL (ref 0.7–4.0)
MCH: 25.6 pg — ABNORMAL LOW (ref 26.0–34.0)
MCHC: 32.1 g/dL (ref 30.0–36.0)
MCV: 79.7 fL (ref 78.0–100.0)
Monocytes Absolute: 0.3 10*3/uL (ref 0.1–1.0)
Monocytes Relative: 7 %
Neutro Abs: 1.6 10*3/uL — ABNORMAL LOW (ref 1.7–7.7)
Neutrophils Relative %: 46 %
Platelets: 77 10*3/uL — ABNORMAL LOW (ref 150–400)
RBC: 3.79 MIL/uL — ABNORMAL LOW (ref 3.87–5.11)
RDW: 16.4 % — ABNORMAL HIGH (ref 11.5–15.5)
WBC: 3.5 10*3/uL — ABNORMAL LOW (ref 4.0–10.5)

## 2017-03-10 MED ORDER — HYDROCOD POLST-CPM POLST ER 10-8 MG/5ML PO SUER
5.0000 mL | Freq: Once | ORAL | Status: AC
Start: 2017-03-10 — End: 2017-03-10
  Administered 2017-03-10: 5 mL via ORAL
  Filled 2017-03-10: qty 5

## 2017-03-10 MED ORDER — ALBUTEROL SULFATE (2.5 MG/3ML) 0.083% IN NEBU
5.0000 mg | INHALATION_SOLUTION | Freq: Once | RESPIRATORY_TRACT | Status: AC
  Administered 2017-03-10: 5 mg via RESPIRATORY_TRACT
  Filled 2017-03-10: qty 6

## 2017-03-10 NOTE — ED Notes (Signed)
Patient transported to X-ray 

## 2017-03-10 NOTE — ED Triage Notes (Signed)
Pt. Coming from [facility] PCP office for productive cough and dizziness x3-4weeks. Pt. States that she's been coughing up green sputum. Pt. Also states she has been unable to keep her sugar down. Pt. Has been taking a lot of cough drops and syrup. [facility] sent her here due to extensive cardiac history. EMS reports 12-lead unremarkable. EMS gave 547mL normal saline en route. EMS reports no orthostatic changes with BP and HR.

## 2017-03-11 LAB — INFLUENZA PANEL BY PCR (TYPE A & B)
Influenza A By PCR: NEGATIVE
Influenza B By PCR: NEGATIVE

## 2017-03-11 MED ORDER — PROMETHAZINE-DM 6.25-15 MG/5ML PO SYRP: 5.0000 mL | ORAL_SOLUTION | Freq: Four times a day (QID) | ORAL | 0 refills | Status: DC | PRN

## 2017-03-11 MED ORDER — DOXYCYCLINE HYCLATE 100 MG PO CAPS: 100.0000 mg | ORAL_CAPSULE | Freq: Two times a day (BID) | ORAL | 0 refills | Status: DC

## 2017-03-11 MED ORDER — GUAIFENESIN ER 1200 MG PO TB12: 1.0000 | ORAL_TABLET | Freq: Two times a day (BID) | ORAL | 0 refills | Status: DC

## 2017-03-11 NOTE — ED Provider Notes (Signed)
[facility] DEPT Provider Note   CSN: [id number] Arrival date & time: 03/10/17  1839     History   Chief Complaint Chief Complaint  Patient presents with  . Cough  . Hyperglycemia    HPI Denise Cuevas is a 66 y.o. female.  HPI Patient presents to the emergency department with cough, body aches, nasal congestion, postnasal drip over the last 2 weeks.  The patient states that she was seen by her primary doctor and advised to come to the emergency department due the fact that her blood sugars have been running high due to the fact she has been eating, cough drops and taking cough syrup.  The patient states that she has had some nausea and body aches.  Patient states she did not take any other medications other than over the counter medications for her cold symptomsThe patient denies chest pain, shortness of breath, headache,blurred vision, neck pain,weakness, numbness, dizziness, anorexia, edema, abdominal pain, nausea, vomiting, diarrhea, rash, back pain, dysuria, hematemesis, bloody stool, near syncope, or syncope. Past Medical History:  Diagnosis Date  . Angina   . Asthma   . 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 Coronary artery disease    s/p CABG  . Depression   . Diabetes mellitus    type 2  . Edema extremities    consistent edema lower legs, feet, and right quadrant abdominal pain-"goes and comes"  . Fibromyalgia   . GERD (gastroesophageal reflux disease)   . H/O hiatal hernia   . Headache(784.0)    occ. generalized  . Heart murmur   . Hypercholesteremia   . Hypertension   . Hypothyroid   . Myocardial infarction 11/2011   MI x2- 6'60,6'30 coronary stent(chest pain episode at time)  . PONV (postoperative nausea and vomiting)     Patient Active Problem List   Diagnosis Date Noted  . Cirrhosis of liver not due to alcohol ([place]) 11/20/2016  . Pancytopenia ([place]) 11/20/2016  . Protein-calorie malnutrition, severe ([place]) 11/20/2016  . Acute diastolic CHF  (congestive heart failure) ([place])   . Congestive heart failure ([place]) 11/19/2016  . Chest pain 05/29/2015  . Memory loss 05/29/2015  . Essential hypertension 03/20/2015  . Dyspnea 02/02/2015  . Acute respiratory distress 02/02/2015  . Heart murmur   . Depression   . GERD (gastroesophageal reflux disease)   . Unstable angina pectoris ([place]) 11/25/2011  . Hypothyroidism   . CAD (coronary artery disease)   . Other and unspecified angina pectoris 11/19/2011  . Type 2 diabetes mellitus, uncontrolled ([place]) 11/19/2011  . Myocardial infarction 11/09/2011    Past Surgical History:  Procedure Laterality Date  . CHOLECYSTECTOMY     open many yrs ago  . COLONOSCOPY WITH PROPOFOL N/A 08/24/2014   Procedure: COLONOSCOPY WITH PROPOFOL;  Surgeon: Arta Silence, MD;  Location: [facility] END[facility]COPY;  Service: Endoscopy;  Laterality: N/A;  . CORONARY ANGIOPLASTY WITH STENT PLACEMENT  05/06/2012  . CORONARY ARTERY BYPASS GRAFT  11/27/2011   Procedure: CORONARY ARTERY BYPASS GRAFTING (CABG);  Surgeon: [name], MD;  Location: [facility];  Service: Open Heart Surgery;  Laterality: N/A;  coronary artery bypass graft on pump times two utilizing left internal mammary artery and right saphenous vein harvested endoscopically   . ESOPHAGOGASTRODUODENOSCOPY (EGD) WITH PROPOFOL N/A 08/24/2014   Procedure: ESOPHAGOGASTRODUODENOSCOPY (EGD) WITH PROPOFOL;  Surgeon: Arta Silence, MD;  Location: [facility] ENDOSCOPY;  Service: Endoscopy;  Laterality: N/A;  . KNEE SURGERY Right    scope surgery  . LEFT  HEART CATHETERIZATION WITH CORONARY ANGIOGRAM N/A 11/19/2011   Procedure: LEFT HEART CATHETERIZATION WITH CORONARY ANGIOGRAM;  Surgeon: Candee Furbish, MD;  Location: [facility] CATH LAB;  Service: Cardiovascular;  Laterality: N/A;  . LEFT HEART CATHETERIZATION WITH CORONARY ANGIOGRAM  05/06/2012   Procedure: LEFT HEART CATHETERIZATION WITH CORONARY ANGIOGRAM;  Surgeon: Jettie Booze, MD;  Location: [facility] CATH LAB;  Service: Cardiovascular;;    . LEFT HEART CATHETERIZATION WITH CORONARY/GRAFT ANGIOGRAM N/A 03/24/2012   Procedure: LEFT HEART CATHETERIZATION WITH Beatrix Fetters;  Surgeon: Jettie Booze, MD;  Location: [facility] CATH LAB;  Service: Cardiovascular;  Laterality: N/A;  . PERCUTANEOUS CORONARY STENT INTERVENTION (PCI-S)  03/24/2012   Procedure: PERCUTANEOUS CORONARY STENT INTERVENTION (PCI-S);  Surgeon: Jettie Booze, MD;  Location: [facility] CATH LAB;  Service: Cardiovascular;;  . PERCUTANEOUS CORONARY STENT INTERVENTION (PCI-S) Bilateral 05/06/2012   Procedure: PERCUTANEOUS CORONARY STENT INTERVENTION (PCI-S);  Surgeon: Jettie Booze, MD;  Location: [facility] CATH LAB;  Service: Cardiovascular;  Laterality: Bilateral;    OB History    No data available       Home Medications    Prior to Admission medications   Medication Sig Start Date End Date Taking? Authorizing Provider  albuterol (PROVENTIL HFA;VENTOLIN HFA) 108 (90 BASE) MCG/ACT inhaler Inhale 2 puffs into the lungs every 6 (six) hours as needed for wheezing or shortness of breath. 02/04/15  Yes Shanker Kristeen Mans, MD  albuterol (PROVENTIL) (2.5 MG/3ML) 0.083% nebulizer solution Take 3 mLs (2.5 mg total) by nebulization every 2 (two) hours as needed for wheezing. 02/04/15  Yes Shanker Kristeen Mans, MD  aspirin 81 MG tablet Take 81 mg by mouth daily.   Yes Historical Provider, MD  atorvastatin (LIPITOR) 40 MG tablet Take 40 mg by mouth daily.   Yes Historical Provider, MD  budesonide-formoterol (SYMBICORT) 80-4.5 MCG/ACT inhaler Inhale 2 puffs into the lungs 2 (two) times daily.   Yes Historical Provider, MD  DULoxetine (CYMBALTA) 30 MG capsule Take 90 mg by mouth daily with lunch. In conjunction with one 60 mg capsule to equal a total of 90 milligrams   Yes Historical Provider, MD  DULoxetine (CYMBALTA) 60 MG capsule Take 90 mg by mouth daily with lunch. In conjunction with one 30 mg capsule to equal a total of 90 milligrams   Yes Historical Provider, MD   furosemide (LASIX) 40 MG tablet Take 1 tablet (40 mg total) by mouth daily. 11/22/16  Yes Modena Jansky, MD  hydrocortisone cream 1 % Apply topically 2 (two) times daily. Apply to right lower leg Patient taking differently: Apply 1 application topically 2 (two) times daily as needed for itching. Apply to right lower leg 11/22/16  Yes Modena Jansky, MD  Insulin Glargine (LANTUS SOLOSTAR) 100 UNIT/ML Solostar Pen Inject 50 Units into the skin 2 (two) times daily. Patient taking differently: Inject 48 Units into the skin 3 (three) times daily.  11/22/16  Yes Modena Jansky, MD  levothyroxine (SYNTHROID, LEVOTHROID) 50 MCG tablet Take 50 mcg by mouth daily before breakfast.   Yes Historical Provider, MD  metoprolol tartrate (LOPRESSOR) 25 MG tablet Take 12.5 mg by mouth 2 (two) times daily.    Yes Historical Provider, MD  nitroGLYCERIN (NITROSTAT) 0.4 MG SL tablet Place 0.4 mg under the tongue every 5 (five) minutes as needed. For chest pain   Yes Historical Provider, MD  omeprazole-sodium bicarbonate (ZEGERID) 40-1100 MG per capsule Take 1 capsule by mouth daily before breakfast.   Yes Historical Provider, MD  oxycodone (OXY-IR) 5  MG capsule Take 5 mg by mouth every 6 (six) hours as needed for pain.    Yes Historical Provider, MD  sitaGLIPtin-metformin (JANUMET) 50-1000 MG per tablet Take 1 tablet by mouth 2 (two) times daily with a meal.   Yes Historical Provider, MD  spironolactone (ALDACTONE) 25 MG tablet Take 1 tablet (25 mg total) by mouth daily. 11/23/16  Yes Modena Jansky, MD  valsartan (DIOVAN) 80 MG tablet Take 1 tablet (80 mg total) by mouth daily. 05/08/16  Yes Tanda Rockers, MD    Family History Family History  Problem Relation Age of Onset  . Heart disease Father 48    died of MI  . Emphysema Father     smoked  . Peripheral vascular disease Mother 30    bilaterial amputations    Social History Social History  Substance Use Topics  . Smoking status: Never Smoker  .  Smokeless tobacco: Never Used  . Alcohol use No     Allergies   Exenatide and Ace inhibitors   Review of Systems Review of Systems All other systems negative except as documented in the HPI. All pertinent positives and negatives as reviewed in the HPI.  Physical Exam Updated Vital Signs BP (!) 127/47   Pulse 93   Resp (!) 25   Ht 5\' 2"  (1.575 m)   Wt 73.9 kg   LMP  (LMP Unknown)   SpO2 96%   BMI 29.81 kg/m   Physical Exam  Constitutional: She is oriented to person, place, and time. She appears well-developed and well-nourished. No distress.  HENT:  Head: Normocephalic and atraumatic.  Mouth/Throat: Oropharynx is clear and moist.  Eyes: Pupils are equal, round, and reactive to light.  Neck: Normal range of motion. Neck supple.  Cardiovascular: Normal rate, regular rhythm and normal heart sounds.  Exam reveals no gallop and no friction rub.   No murmur heard. Pulmonary/Chest: Effort normal and breath sounds normal. No respiratory distress. She has no wheezes. She has no rales.  Abdominal: Soft. Bowel sounds are normal. She exhibits no distension. There is no tenderness.  Neurological: She is alert and oriented to person, place, and time. No sensory deficit. She exhibits normal muscle tone. Coordination normal.  Skin: Skin is warm and dry. Capillary refill takes less than 2 seconds. No rash noted. No erythema.  Psychiatric: She has a normal mood and affect. Her behavior is normal.  Nursing note and vitals reviewed.    ED Treatments / Results  Labs (all labs ordered are listed, but only abnormal results are displayed) Labs Reviewed  CBC WITH DIFFERENTIAL/PLATELET - Abnormal; Notable for the following:       Result Value   WBC 3.5 (*)    RBC 3.79 (*)    Hemoglobin 9.7 (*)    HCT 30.2 (*)    MCH 25.6 (*)    RDW 16.4 (*)    Platelets 77 (*)    Neutro Abs 1.6 (*)    All other components within normal limits  BASIC METABOLIC PANEL - Abnormal; Notable for the  following:    Glucose, Bld 318 (*)    Calcium 8.4 (*)    All other components within normal limits  INFLUENZA PANEL BY PCR (TYPE A & B)  URINALYSIS, ROUTINE W REFLEX MICROSCOPIC    EKG  EKG Interpretation None       Radiology Dg Chest 2 View  Result Date: 03/10/2017 CLINICAL DATA:  Cough EXAM: CHEST  2 VIEW COMPARISON:  None.  FINDINGS: The heart size and mediastinal contours are within normal limits. Both lungs are clear. The visualized skeletal structures are unremarkable. IMPRESSION: No active cardiopulmonary disease. Electronically Signed   By: Ulyses Jarred M.D.   On: 03/10/2017 20:07    Procedures Procedures (including critical care time)  Medications Ordered in ED Medications  chlorpheniramine-HYDROcodone (TUSSIONEX) 10-8 MG/5ML suspension 5 mL (5 mLs Oral Given 03/10/17 2216)  albuterol (PROVENTIL) (2.5 MG/3ML) 0.083% nebulizer solution 5 mg (5 mg Nebulization Given 03/10/17 2217)     Initial Impression / Assessment and Plan / ED Course  I have reviewed the triage vital signs and the nursing notes.  Pertinent labs & imaging results that were available during my care of the patient were reviewed by me and considered in my medical decision making (see chart for details).    The patient's laboratory testing and x-rays did not show any significant abnormalities.  I feel that the patient can be discharged home with follow-up with her primary care doctor.  I did place her on antibiotics.  Patient was also placed on an expectorant along with cough suppressant along with inhalers.  Patient is advised to return here for any worsening in her condition.    Final Clinical Impressions(s) / ED Diagnoses   Final diagnoses:  None    New Prescriptions New Prescriptions   No medications on file     Dalia Heading, PA-C 03/12/17 5035    Fredia Sorrow, MD 03/12/17 1759

## 2017-03-11 NOTE — Discharge Instructions (Signed)
Your chest x-ray did not show any pneumonia.  Follow-up with your primary doctor, increase her fluid intake and rest as much as possible.

## 2017-03-14 ENCOUNTER — Other Ambulatory Visit: Payer: Self-pay | Admitting: Gastroenterology

## 2017-03-14 DIAGNOSIS — K7469 Other cirrhosis of liver: Secondary | ICD-10-CM

## 2017-03-31 ENCOUNTER — Ambulatory Visit
Admission: RE | Admit: 2017-03-31 | Discharge: 2017-03-31 | Disposition: A | Discharge status: Home / Self Care | Payer: Medicare Other | Source: Ambulatory Visit | Attending: Gastroenterology | Admitting: Gastroenterology

## 2017-03-31 DIAGNOSIS — K7469 Other cirrhosis of liver: Secondary | ICD-10-CM

## 2017-04-29 ENCOUNTER — Ambulatory Visit (INDEPENDENT_AMBULATORY_CARE_PROVIDER_SITE_OTHER): Payer: Medicare Other | Admitting: Podiatry

## 2017-04-29 ENCOUNTER — Encounter: Payer: Self-pay | Admitting: Podiatry

## 2017-04-29 DIAGNOSIS — E1151 Type 2 diabetes mellitus with diabetic peripheral angiopathy without gangrene: Secondary | ICD-10-CM | POA: Diagnosis not present

## 2017-04-29 DIAGNOSIS — Q828 Other specified congenital malformations of skin: Secondary | ICD-10-CM | POA: Diagnosis not present

## 2017-04-29 NOTE — Patient Instructions (Signed)

## 2017-04-29 NOTE — Progress Notes (Signed)
Patient ID: Denise Cuevas, female   DOB: 1951-02-17, 66 y.o.   MRN: [id number]    Subjective: This patient presents today complaining of a painful nucleated skin lesion on the plantar lateral aspect left foot. This patient was last evaluated for this problem on the visit of 01/28/2017 with debridement Patient is diabetic without history of foot ulceration, claudication or amputation Patient denies smoking history    Patient estimates 5 foot 2 inches and 172 pounds  Orientated 3  Vascular: DP and PT pulses 1/4 bilaterally Capillary reflex delay bilaterally  Neurological: Sensation to 10 g monofilament wire intact 5/5 bilaterally Vibratory sensation reactive bilaterally Ankle reflexes reactive bilaterally  Dermatological: No open skin lesions bilaterally Nucleated plantar keratoses base of fifth left metatarsal. There is no surrounding erythema, edema, warmth Diffuse plantar keratoses plantar distal MPJ 2-4 left  Musculoskeletal: Manual motor testing dorsi flexion, plantar flexion, inversion, eversion 5/5 bilaterally   Assessment: Diabetic with peripheral arterial disease associated with diminished pedal pulses Protective sensation intact Porokeratosis 1  Plan: Reviewed the results of exam and informed patient that the skin lesion was a chronic skinlike growth and needed periodic trimming. P Porokeratosis on the plantar aspect left foot was debrided without any bleeding Apply salinocaine  Reappoint 3 months  Reappoint at patient's request or yearly

## 2017-07-30 ENCOUNTER — Encounter: Payer: Self-pay | Admitting: Podiatry

## 2017-07-30 ENCOUNTER — Ambulatory Visit (INDEPENDENT_AMBULATORY_CARE_PROVIDER_SITE_OTHER): Payer: Medicare Other | Admitting: Podiatry

## 2017-07-30 DIAGNOSIS — Q828 Other specified congenital malformations of skin: Secondary | ICD-10-CM

## 2017-07-30 DIAGNOSIS — E1151 Type 2 diabetes mellitus with diabetic peripheral angiopathy without gangrene: Secondary | ICD-10-CM | POA: Diagnosis not present

## 2017-07-30 NOTE — Progress Notes (Signed)
Patient ID: Denise Cuevas, female   DOB: [birth year], 66 y.o.   MRN: [id number]    Subjective: This patient presents today complaining of a painful nucleated skin lesion on the plantar lateral aspect left foot. This patient was last evaluated for this problem on the visit of 04/29/2017 with debridement   Patient is diabetic without history of foot ulceration, claudication or amputation Patient denies smoking history    Patient estimates 5 foot 2 inches and 172 pounds  Orientated 3  Vascular: DP and PT pulses 1/4 bilaterally Capillary reflex delay bilaterally  Neurological: Sensation to 10 g monofilament wire intact 5/5 bilaterally Vibratory sensation reactive bilaterally Ankle reflexes reactive bilaterally  Dermatological: No open skin lesions bilaterally Nucleated plantar keratoses base of fifth left metatarsal. There is no surrounding erythema, edema, warmth Diffuse plantar keratoses plantar distal MPJ 2-4 left Atrophic skin with absent hair growth bilaterally Elongated incurvated toenails 6-10  Musculoskeletal: Manual motor testing dorsi flexion, plantar flexion, inversion, eversion 5/5 bilaterally   Assessment: Diabetic with peripheral arterial disease associated with diminished pedal pulses Protective sensation intact Porokeratosis 1  Plan: Reviewed the results of exam and informed patient that the skin lesion was a chronic skinlike growth and needed periodic trimming. P Porokeratosis on the plantar aspect left foot was debrided without any bleeding Apply salinocaine Debride nails 10 without any bleeding  Reappoint 3 months

## 2017-07-30 NOTE — Patient Instructions (Signed)

## 2017-08-15 ENCOUNTER — Telehealth: Payer: Self-pay | Admitting: Cardiology

## 2017-08-15 NOTE — Telephone Encounter (Signed)
From 11/20/2016 hospital notes by Dr [name] -  Acute diastolic heart failure  - increased fluid overload, likely cirrhosis contributing if not the actual cause.   EF from 11/20/16 in hospital is 60-65%.

## 2017-08-15 NOTE — Telephone Encounter (Signed)
New Message  Megan, Nurse from [place] called to get injection fraction and pt last bp reading. She would also like to know if pt HF episode was acute? Please call back to discuss

## 2017-08-15 NOTE — Telephone Encounter (Signed)
Called with information as requested.  Jinny Blossom thanked me for the information.

## 2017-10-01 ENCOUNTER — Other Ambulatory Visit: Payer: Self-pay | Admitting: Gastroenterology

## 2017-10-01 DIAGNOSIS — K7460 Unspecified cirrhosis of liver: Secondary | ICD-10-CM

## 2017-10-10 ENCOUNTER — Ambulatory Visit
Admission: RE | Admit: 2017-10-10 | Discharge: 2017-10-10 | Disposition: A | Discharge status: Home / Self Care | Payer: Medicare Other | Source: Ambulatory Visit | Attending: Gastroenterology | Admitting: Gastroenterology

## 2017-10-10 DIAGNOSIS — K7460 Unspecified cirrhosis of liver: Secondary | ICD-10-CM

## 2017-10-15 ENCOUNTER — Other Ambulatory Visit: Payer: Self-pay

## 2017-10-15 ENCOUNTER — Encounter: Payer: Self-pay | Admitting: Nurse Practitioner

## 2017-10-15 ENCOUNTER — Other Ambulatory Visit: Payer: Self-pay | Admitting: Nurse Practitioner

## 2017-10-15 ENCOUNTER — Inpatient Hospital Stay (HOSPITAL_COMMUNITY)
Admission: EM | Admit: 2017-10-15 | Discharge: 2017-10-22 | DRG: 287 | Disposition: A | Discharge status: Home / Self Care | Payer: Medicare Other | Attending: Family Medicine | Admitting: Family Medicine

## 2017-10-15 ENCOUNTER — Ambulatory Visit (INDEPENDENT_AMBULATORY_CARE_PROVIDER_SITE_OTHER): Payer: Medicare Other | Admitting: Nurse Practitioner

## 2017-10-15 ENCOUNTER — Emergency Department (HOSPITAL_COMMUNITY): Payer: Medicare Other

## 2017-10-15 VITALS — BP 108/64 | HR 80 | Ht 62.0 in | Wt 174.1 lb

## 2017-10-15 DIAGNOSIS — D61818 Other pancytopenia: Secondary | ICD-10-CM

## 2017-10-15 DIAGNOSIS — K7460 Unspecified cirrhosis of liver: Secondary | ICD-10-CM | POA: Diagnosis present

## 2017-10-15 DIAGNOSIS — F419 Anxiety disorder, unspecified: Secondary | ICD-10-CM | POA: Diagnosis present

## 2017-10-15 DIAGNOSIS — E7800 Pure hypercholesterolemia, unspecified: Secondary | ICD-10-CM | POA: Diagnosis present

## 2017-10-15 DIAGNOSIS — I350 Nonrheumatic aortic (valve) stenosis: Secondary | ICD-10-CM

## 2017-10-15 DIAGNOSIS — E119 Type 2 diabetes mellitus without complications: Secondary | ICD-10-CM | POA: Diagnosis present

## 2017-10-15 DIAGNOSIS — Z955 Presence of coronary angioplasty implant and graft: Secondary | ICD-10-CM

## 2017-10-15 DIAGNOSIS — R079 Chest pain, unspecified: Secondary | ICD-10-CM | POA: Diagnosis present

## 2017-10-15 DIAGNOSIS — J45991 Cough variant asthma: Secondary | ICD-10-CM | POA: Diagnosis not present

## 2017-10-15 DIAGNOSIS — I110 Hypertensive heart disease with heart failure: Principal | ICD-10-CM | POA: Diagnosis present

## 2017-10-15 DIAGNOSIS — L0390 Cellulitis, unspecified: Secondary | ICD-10-CM | POA: Diagnosis not present

## 2017-10-15 DIAGNOSIS — I200 Unstable angina: Secondary | ICD-10-CM

## 2017-10-15 DIAGNOSIS — I25118 Atherosclerotic heart disease of native coronary artery with other forms of angina pectoris: Secondary | ICD-10-CM

## 2017-10-15 DIAGNOSIS — R0789 Other chest pain: Secondary | ICD-10-CM

## 2017-10-15 DIAGNOSIS — E872 Acidosis: Secondary | ICD-10-CM | POA: Diagnosis present

## 2017-10-15 DIAGNOSIS — R161 Splenomegaly, not elsewhere classified: Secondary | ICD-10-CM | POA: Diagnosis present

## 2017-10-15 DIAGNOSIS — Z66 Do not resuscitate: Secondary | ICD-10-CM | POA: Diagnosis present

## 2017-10-15 DIAGNOSIS — Z794 Long term (current) use of insulin: Secondary | ICD-10-CM

## 2017-10-15 DIAGNOSIS — I5033 Acute on chronic diastolic (congestive) heart failure: Secondary | ICD-10-CM | POA: Diagnosis present

## 2017-10-15 DIAGNOSIS — F329 Major depressive disorder, single episode, unspecified: Secondary | ICD-10-CM | POA: Diagnosis present

## 2017-10-15 DIAGNOSIS — Z79899 Other long term (current) drug therapy: Secondary | ICD-10-CM

## 2017-10-15 DIAGNOSIS — J209 Acute bronchitis, unspecified: Secondary | ICD-10-CM | POA: Diagnosis present

## 2017-10-15 DIAGNOSIS — Z825 Family history of asthma and other chronic lower respiratory diseases: Secondary | ICD-10-CM

## 2017-10-15 DIAGNOSIS — D696 Thrombocytopenia, unspecified: Secondary | ICD-10-CM | POA: Diagnosis present

## 2017-10-15 DIAGNOSIS — E785 Hyperlipidemia, unspecified: Secondary | ICD-10-CM | POA: Diagnosis present

## 2017-10-15 DIAGNOSIS — Z7951 Long term (current) use of inhaled steroids: Secondary | ICD-10-CM

## 2017-10-15 DIAGNOSIS — R609 Edema, unspecified: Secondary | ICD-10-CM | POA: Diagnosis not present

## 2017-10-15 DIAGNOSIS — Z7982 Long term (current) use of aspirin: Secondary | ICD-10-CM

## 2017-10-15 DIAGNOSIS — I878 Other specified disorders of veins: Secondary | ICD-10-CM | POA: Diagnosis present

## 2017-10-15 DIAGNOSIS — Z951 Presence of aortocoronary bypass graft: Secondary | ICD-10-CM

## 2017-10-15 DIAGNOSIS — R011 Cardiac murmur, unspecified: Secondary | ICD-10-CM | POA: Diagnosis present

## 2017-10-15 DIAGNOSIS — Z9049 Acquired absence of other specified parts of digestive tract: Secondary | ICD-10-CM

## 2017-10-15 DIAGNOSIS — I050 Rheumatic mitral stenosis: Secondary | ICD-10-CM

## 2017-10-15 DIAGNOSIS — L03119 Cellulitis of unspecified part of limb: Secondary | ICD-10-CM

## 2017-10-15 DIAGNOSIS — J42 Unspecified chronic bronchitis: Secondary | ICD-10-CM

## 2017-10-15 DIAGNOSIS — J440 Chronic obstructive pulmonary disease with acute lower respiratory infection: Secondary | ICD-10-CM | POA: Diagnosis present

## 2017-10-15 DIAGNOSIS — Z8249 Family history of ischemic heart disease and other diseases of the circulatory system: Secondary | ICD-10-CM

## 2017-10-15 DIAGNOSIS — R0602 Shortness of breath: Secondary | ICD-10-CM | POA: Diagnosis not present

## 2017-10-15 DIAGNOSIS — E43 Unspecified severe protein-calorie malnutrition: Secondary | ICD-10-CM

## 2017-10-15 DIAGNOSIS — E039 Hypothyroidism, unspecified: Secondary | ICD-10-CM | POA: Diagnosis present

## 2017-10-15 DIAGNOSIS — I252 Old myocardial infarction: Secondary | ICD-10-CM

## 2017-10-15 DIAGNOSIS — I959 Hypotension, unspecified: Secondary | ICD-10-CM | POA: Diagnosis present

## 2017-10-15 DIAGNOSIS — J441 Chronic obstructive pulmonary disease with (acute) exacerbation: Secondary | ICD-10-CM | POA: Diagnosis present

## 2017-10-15 DIAGNOSIS — K219 Gastro-esophageal reflux disease without esophagitis: Secondary | ICD-10-CM | POA: Diagnosis present

## 2017-10-15 DIAGNOSIS — I25700 Atherosclerosis of coronary artery bypass graft(s), unspecified, with unstable angina pectoris: Secondary | ICD-10-CM | POA: Diagnosis present

## 2017-10-15 DIAGNOSIS — M797 Fibromyalgia: Secondary | ICD-10-CM | POA: Diagnosis present

## 2017-10-15 DIAGNOSIS — Z888 Allergy status to other drugs, medicaments and biological substances status: Secondary | ICD-10-CM

## 2017-10-15 DIAGNOSIS — I080 Rheumatic disorders of both mitral and aortic valves: Secondary | ICD-10-CM | POA: Diagnosis present

## 2017-10-15 DIAGNOSIS — R600 Localized edema: Secondary | ICD-10-CM

## 2017-10-15 DIAGNOSIS — N179 Acute kidney failure, unspecified: Secondary | ICD-10-CM | POA: Diagnosis present

## 2017-10-15 DIAGNOSIS — D509 Iron deficiency anemia, unspecified: Secondary | ICD-10-CM | POA: Diagnosis present

## 2017-10-15 DIAGNOSIS — I5031 Acute diastolic (congestive) heart failure: Secondary | ICD-10-CM

## 2017-10-15 DIAGNOSIS — I25110 Atherosclerotic heart disease of native coronary artery with unstable angina pectoris: Secondary | ICD-10-CM | POA: Diagnosis present

## 2017-10-15 HISTORY — DX: Dyspnea, unspecified: R06.00

## 2017-10-15 HISTORY — DX: Heart failure, unspecified: I50.9

## 2017-10-15 HISTORY — DX: Family history of other specified conditions: Z84.89

## 2017-10-15 LAB — HEMOGLOBIN A1C
Hgb A1c MFr Bld: 8.4 % — ABNORMAL HIGH (ref 4.8–5.6)
Mean Plasma Glucose: 194.38 mg/dL

## 2017-10-15 LAB — CBC
HCT: 30.6 % — ABNORMAL LOW (ref 36.0–46.0)
HEMOGLOBIN: 9 g/dL — AB (ref 12.0–15.0)
MCH: 22 pg — AB (ref 26.0–34.0)
MCHC: 29.4 g/dL — ABNORMAL LOW (ref 30.0–36.0)
MCV: 74.8 fL — AB (ref 78.0–100.0)
PLATELETS: 89 10*3/uL — AB (ref 150–400)
RBC: 4.09 MIL/uL (ref 3.87–5.11)
RDW: 18.4 % — ABNORMAL HIGH (ref 11.5–15.5)
WBC: 4.6 10*3/uL (ref 4.0–10.5)

## 2017-10-15 LAB — BASIC METABOLIC PANEL
ANION GAP: 5 (ref 5–15)
BUN: 12 mg/dL (ref 6–20)
CALCIUM: 7.9 mg/dL — AB (ref 8.9–10.3)
CHLORIDE: 103 mmol/L (ref 101–111)
CO2: 29 mmol/L (ref 22–32)
CREATININE: 1.07 mg/dL — AB (ref 0.44–1.00)
GFR calc non Af Amer: 54 mL/min — ABNORMAL LOW (ref >60)
Glucose, Bld: 360 mg/dL — ABNORMAL HIGH (ref 65–99)
Potassium: 3.2 mmol/L — ABNORMAL LOW (ref 3.5–5.1)
SODIUM: 137 mmol/L (ref 135–145)

## 2017-10-15 LAB — CBC WITH DIFFERENTIAL/PLATELET
Basophils Absolute: 0 10*3/uL (ref 0.0–0.1)
Basophils Relative: 1 %
Eosinophils Absolute: 0 10*3/uL (ref 0.0–0.7)
Eosinophils Relative: 1 %
HCT: 30 % — ABNORMAL LOW (ref 36.0–46.0)
Hemoglobin: 9 g/dL — ABNORMAL LOW (ref 12.0–15.0)
Lymphocytes Relative: 32 %
Lymphs Abs: 0.9 10*3/uL (ref 0.7–4.0)
MCH: 22.2 pg — ABNORMAL LOW (ref 26.0–34.0)
MCHC: 30 g/dL (ref 30.0–36.0)
MCV: 73.9 fL — ABNORMAL LOW (ref 78.0–100.0)
Monocytes Absolute: 0.1 10*3/uL (ref 0.1–1.0)
Monocytes Relative: 5 %
Neutro Abs: 1.9 10*3/uL (ref 1.7–7.7)
Neutrophils Relative %: 61 %
Platelets: 81 10*3/uL — ABNORMAL LOW (ref 150–400)
RBC: 4.06 MIL/uL (ref 3.87–5.11)
RDW: 18.8 % — ABNORMAL HIGH (ref 11.5–15.5)
WBC: 2.9 10*3/uL — ABNORMAL LOW (ref 4.0–10.5)

## 2017-10-15 LAB — I-STAT TROPONIN, ED: TROPONIN I, POC: 0 ng/mL (ref 0.00–0.08)

## 2017-10-15 LAB — TROPONIN I: Troponin I: <0.03 ng/mL (ref <0.03)

## 2017-10-15 LAB — GLUCOSE, CAPILLARY
GLUCOSE-CAPILLARY: 369 mg/dL — AB (ref 65–99)
Glucose-Capillary: 157 mg/dL — ABNORMAL HIGH (ref 65–99)

## 2017-10-15 LAB — COMPREHENSIVE METABOLIC PANEL
ALT: 39 U/L (ref 14–54)
AST: 71 U/L — ABNORMAL HIGH (ref 15–41)
Albumin: 2.4 g/dL — ABNORMAL LOW (ref 3.5–5.0)
Alkaline Phosphatase: 146 U/L — ABNORMAL HIGH (ref 38–126)
Anion gap: 7 (ref 5–15)
BUN: 10 mg/dL (ref 6–20)
CO2: 26 mmol/L (ref 22–32)
Calcium: 7.9 mg/dL — ABNORMAL LOW (ref 8.9–10.3)
Chloride: 104 mmol/L (ref 101–111)
Creatinine, Ser: 0.96 mg/dL (ref 0.44–1.00)
GFR calc Af Amer: >60 mL/min (ref >60)
GFR calc non Af Amer: >60 mL/min (ref >60)
Glucose, Bld: 162 mg/dL — ABNORMAL HIGH (ref 65–99)
Potassium: 3.1 mmol/L — ABNORMAL LOW (ref 3.5–5.1)
Sodium: 137 mmol/L (ref 135–145)
Total Bilirubin: 2.3 mg/dL — ABNORMAL HIGH (ref 0.3–1.2)
Total Protein: 5 g/dL — ABNORMAL LOW (ref 6.5–8.1)

## 2017-10-15 LAB — PROTIME-INR
INR: 1.31
Prothrombin Time: 16.2 seconds — ABNORMAL HIGH (ref 11.4–15.2)

## 2017-10-15 LAB — TSH: TSH: 2.664 u[IU]/mL (ref 0.350–4.500)

## 2017-10-15 LAB — BRAIN NATRIURETIC PEPTIDE
B NATRIURETIC PEPTIDE 5: 125.3 pg/mL — AB (ref 0.0–100.0)
B Natriuretic Peptide: 146.1 pg/mL — ABNORMAL HIGH (ref 0.0–100.0)

## 2017-10-15 LAB — I-STAT CG4 LACTIC ACID, ED
LACTIC ACID, VENOUS: 2.42 mmol/L — AB (ref 0.5–1.9)
Lactic Acid, Venous: 2.95 mmol/L (ref 0.5–1.9)

## 2017-10-15 LAB — MAGNESIUM: Magnesium: 2 mg/dL (ref 1.7–2.4)

## 2017-10-15 LAB — APTT: aPTT: 36 seconds (ref 24–36)

## 2017-10-15 MED ORDER — ASPIRIN 81 MG PO CHEW
324.0000 mg | CHEWABLE_TABLET | ORAL | Status: AC
  Administered 2017-10-15: 324 mg via ORAL
  Filled 2017-10-15: qty 4

## 2017-10-15 MED ORDER — LEVOTHYROXINE SODIUM 50 MCG PO TABS: 50.0000 ug | ORAL_TABLET | Freq: Every day | ORAL | Status: DC

## 2017-10-15 MED ORDER — ALBUTEROL SULFATE (2.5 MG/3ML) 0.083% IN NEBU: 2.5000 mg | INHALATION_SOLUTION | RESPIRATORY_TRACT | Status: DC | PRN

## 2017-10-15 MED ORDER — ATORVASTATIN CALCIUM 40 MG PO TABS: 40.0000 mg | ORAL_TABLET | Freq: Every day | ORAL | Status: DC

## 2017-10-15 MED ORDER — SODIUM CHLORIDE 0.9% FLUSH: 3.0000 mL | INTRAVENOUS | Status: DC | PRN

## 2017-10-15 MED ORDER — ALBUTEROL SULFATE (2.5 MG/3ML) 0.083% IN NEBU
2.5000 mg | INHALATION_SOLUTION | RESPIRATORY_TRACT | Status: DC | PRN
  Administered 2017-10-17 – 2017-10-18 (×2): 2.5 mg via RESPIRATORY_TRACT
  Filled 2017-10-15 (×2): qty 3

## 2017-10-15 MED ORDER — ONDANSETRON HCL 4 MG/2ML IJ SOLN: 4.0000 mg | Freq: Four times a day (QID) | INTRAMUSCULAR | Status: DC | PRN

## 2017-10-15 MED ORDER — METOPROLOL TARTRATE 12.5 MG HALF TABLET: 12.5000 mg | ORAL_TABLET | Freq: Two times a day (BID) | ORAL | Status: DC

## 2017-10-15 MED ORDER — SPIRONOLACTONE 25 MG PO TABS
25.0000 mg | ORAL_TABLET | Freq: Every day | ORAL | Status: DC
  Administered 2017-10-16 – 2017-10-18 (×3): 25 mg via ORAL
  Filled 2017-10-15 (×3): qty 1

## 2017-10-15 MED ORDER — IPRATROPIUM BROMIDE 0.02 % IN SOLN
0.5000 mg | Freq: Once | RESPIRATORY_TRACT | Status: AC
  Administered 2017-10-15: 0.5 mg via RESPIRATORY_TRACT
  Filled 2017-10-15: qty 2.5

## 2017-10-15 MED ORDER — ALBUTEROL SULFATE HFA 108 (90 BASE) MCG/ACT IN AERS: 2.0000 | INHALATION_SPRAY | Freq: Four times a day (QID) | RESPIRATORY_TRACT | Status: DC | PRN

## 2017-10-15 MED ORDER — SODIUM CHLORIDE 0.9 % IV BOLUS (SEPSIS): 500.0000 mL | Freq: Once | INTRAVENOUS | Status: DC

## 2017-10-15 MED ORDER — POTASSIUM CHLORIDE CRYS ER 20 MEQ PO TBCR
40.0000 meq | EXTENDED_RELEASE_TABLET | Freq: Once | ORAL | Status: AC
  Administered 2017-10-15: 40 meq via ORAL
  Filled 2017-10-15: qty 2

## 2017-10-15 MED ORDER — NITROGLYCERIN 2 % TD OINT
1.0000 [in_us] | TOPICAL_OINTMENT | Freq: Four times a day (QID) | TRANSDERMAL | Status: DC
  Filled 2017-10-15: qty 30

## 2017-10-15 MED ORDER — BENZONATATE 100 MG PO CAPS
100.0000 mg | ORAL_CAPSULE | Freq: Two times a day (BID) | ORAL | Status: DC | PRN
  Administered 2017-10-17: 100 mg via ORAL
  Filled 2017-10-15: qty 1

## 2017-10-15 MED ORDER — ASPIRIN 300 MG RE SUPP
300.0000 mg | RECTAL | Status: AC
  Filled 2017-10-15: qty 1

## 2017-10-15 MED ORDER — SODIUM CHLORIDE 0.9% FLUSH
3.0000 mL | Freq: Two times a day (BID) | INTRAVENOUS | Status: DC
  Administered 2017-10-15 – 2017-10-21 (×9): 3 mL via INTRAVENOUS

## 2017-10-15 MED ORDER — NITROGLYCERIN 0.4 MG SL SUBL: 0.4000 mg | SUBLINGUAL_TABLET | SUBLINGUAL | Status: DC | PRN

## 2017-10-15 MED ORDER — OXYCODONE HCL 5 MG PO TABS: 5.0000 mg | ORAL_TABLET | Freq: Four times a day (QID) | ORAL | Status: DC | PRN

## 2017-10-15 MED ORDER — PANTOPRAZOLE SODIUM 40 MG PO TBEC: 80.0000 mg | DELAYED_RELEASE_TABLET | Freq: Every day | ORAL | Status: DC

## 2017-10-15 MED ORDER — PANTOPRAZOLE SODIUM 40 MG PO TBEC
80.0000 mg | DELAYED_RELEASE_TABLET | Freq: Every day | ORAL | Status: DC
  Administered 2017-10-16 – 2017-10-22 (×7): 80 mg via ORAL
  Filled 2017-10-15 (×7): qty 2

## 2017-10-15 MED ORDER — LEVOFLOXACIN 750 MG PO TABS
750.0000 mg | ORAL_TABLET | Freq: Every day | ORAL | Status: DC
  Administered 2017-10-15 – 2017-10-16 (×2): 750 mg via ORAL
  Filled 2017-10-15 (×2): qty 1

## 2017-10-15 MED ORDER — ATORVASTATIN CALCIUM 40 MG PO TABS
40.0000 mg | ORAL_TABLET | Freq: Every day | ORAL | Status: DC
  Administered 2017-10-15 – 2017-10-22 (×8): 40 mg via ORAL
  Filled 2017-10-15 (×8): qty 1

## 2017-10-15 MED ORDER — ALBUTEROL SULFATE (2.5 MG/3ML) 0.083% IN NEBU
5.0000 mg | INHALATION_SOLUTION | Freq: Once | RESPIRATORY_TRACT | Status: AC
  Administered 2017-10-15: 5 mg via RESPIRATORY_TRACT
  Filled 2017-10-15: qty 6

## 2017-10-15 MED ORDER — LEVOTHYROXINE SODIUM 50 MCG PO TABS
50.0000 ug | ORAL_TABLET | Freq: Every day | ORAL | Status: DC
  Administered 2017-10-16: 50 ug via ORAL
  Filled 2017-10-15: qty 1

## 2017-10-15 MED ORDER — DOXYCYCLINE HYCLATE 100 MG PO TABS: 100.0000 mg | ORAL_TABLET | Freq: Two times a day (BID) | ORAL | Status: DC

## 2017-10-15 MED ORDER — ENOXAPARIN SODIUM 80 MG/0.8ML ~~LOC~~ SOLN: 1.0000 mg/kg | Freq: Two times a day (BID) | SUBCUTANEOUS | Status: DC

## 2017-10-15 MED ORDER — FUROSEMIDE 10 MG/ML IJ SOLN
40.0000 mg | Freq: Once | INTRAMUSCULAR | Status: AC
  Administered 2017-10-15: 40 mg via INTRAVENOUS
  Filled 2017-10-15: qty 4

## 2017-10-15 MED ORDER — INSULIN ASPART 100 UNIT/ML ~~LOC~~ SOLN
0.0000 [IU] | Freq: Three times a day (TID) | SUBCUTANEOUS | Status: DC
  Administered 2017-10-16: 5 [IU] via SUBCUTANEOUS
  Administered 2017-10-16 (×2): 2 [IU] via SUBCUTANEOUS

## 2017-10-15 MED ORDER — LOSARTAN POTASSIUM 50 MG PO TABS: 50.0000 mg | ORAL_TABLET | Freq: Every day | ORAL | Status: DC

## 2017-10-15 MED ORDER — FUROSEMIDE 10 MG/ML IJ SOLN: 40.0000 mg | Freq: Every day | INTRAMUSCULAR | Status: DC

## 2017-10-15 MED ORDER — DULOXETINE HCL 60 MG PO CPEP: 60.0000 mg | ORAL_CAPSULE | Freq: Every day | ORAL | Status: DC

## 2017-10-15 MED ORDER — SPIRONOLACTONE 25 MG PO TABS: 25.0000 mg | ORAL_TABLET | Freq: Every day | ORAL | Status: DC

## 2017-10-15 MED ORDER — FUROSEMIDE 20 MG PO TABS
40.0000 mg | ORAL_TABLET | Freq: Every day | ORAL | Status: DC
  Administered 2017-10-16: 40 mg via ORAL
  Filled 2017-10-15: qty 2

## 2017-10-15 MED ORDER — DULOXETINE HCL 30 MG PO CPEP: 30.0000 mg | ORAL_CAPSULE | Freq: Every day | ORAL | Status: DC

## 2017-10-15 MED ORDER — MOMETASONE FURO-FORMOTEROL FUM 100-5 MCG/ACT IN AERO
2.0000 | INHALATION_SPRAY | Freq: Two times a day (BID) | RESPIRATORY_TRACT | Status: DC
  Administered 2017-10-15 – 2017-10-22 (×14): 2 via RESPIRATORY_TRACT
  Filled 2017-10-15: qty 8.8

## 2017-10-15 MED ORDER — SODIUM CHLORIDE 0.9 % IV SOLN: 250.0000 mL | INTRAVENOUS | Status: DC | PRN

## 2017-10-15 MED ORDER — ACETAMINOPHEN 325 MG PO TABS: 650.0000 mg | ORAL_TABLET | ORAL | Status: DC | PRN

## 2017-10-15 MED ORDER — DULOXETINE HCL 30 MG PO CPEP
90.0000 mg | ORAL_CAPSULE | Freq: Every day | ORAL | Status: DC
  Administered 2017-10-16 – 2017-10-22 (×6): 90 mg via ORAL
  Filled 2017-10-15 (×6): qty 1

## 2017-10-15 MED ORDER — DEXTROSE 5 % IV SOLN
1.0000 g | Freq: Once | INTRAVENOUS | Status: AC
  Administered 2017-10-15: 1 g via INTRAVENOUS
  Filled 2017-10-15: qty 10

## 2017-10-15 MED ORDER — INSULIN GLARGINE 100 UNIT/ML ~~LOC~~ SOLN: 50.0000 [IU] | Freq: Two times a day (BID) | SUBCUTANEOUS | Status: DC

## 2017-10-15 MED ORDER — ASPIRIN 81 MG PO CHEW
81.0000 mg | CHEWABLE_TABLET | Freq: Every day | ORAL | Status: DC
  Administered 2017-10-16 – 2017-10-19 (×4): 81 mg via ORAL
  Filled 2017-10-15 (×5): qty 1

## 2017-10-15 MED ORDER — DEXAMETHASONE SODIUM PHOSPHATE 10 MG/ML IJ SOLN
10.0000 mg | Freq: Once | INTRAMUSCULAR | Status: AC
  Administered 2017-10-15: 10 mg via INTRAVENOUS
  Filled 2017-10-15: qty 1

## 2017-10-15 MED ORDER — ASPIRIN EC 81 MG PO TBEC: 81.0000 mg | DELAYED_RELEASE_TABLET | Freq: Every day | ORAL | Status: DC

## 2017-10-15 MED ORDER — INSULIN ASPART PROT & ASPART (70-30 MIX) 100 UNIT/ML ~~LOC~~ SUSP
30.0000 [IU] | Freq: Two times a day (BID) | SUBCUTANEOUS | Status: DC
  Administered 2017-10-16: 30 [IU] via SUBCUTANEOUS
  Filled 2017-10-15: qty 10
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
## 2017-10-15 NOTE — H&P (Signed)
Office Visit   10/15/2017 [facility]    [place], Marlane Hatcher, NP  Cardiology   Unstable angina pectoris [facility]) +5 more  Dx   Edema   ; Referred by Aura Dials, MD  Reason for Visit   Additional Documentation   Vitals:   BP 108/64 (BP Location: Left Arm, Patient Position: Sitting, Cuff Size: Normal)   Pulse 80   Ht 5\' 2"  (1.575 m)   Wt 174 lb 1.9 oz (79 kg)   LMP (LMP Unknown)   BMI 31.85 kg/m   BSA 1.86 m   Flowsheets:   MEWS Score,   Anthropometrics     Encounter Info:   Billing Info,   History,   Allergies,   Detailed Report     All Notes   Procedures by Burtis Junes, NP at 10/15/2017 11:30 AM   Author: Burtis Junes, NP Author Type: Nurse Practitioner Filed: 10/15/2017 12:46 PM  Note Status: Signed Cosign: Cosign Not Required Encounter Date: 10/15/2017  Editor: Sallee Provencal L        Scan on 10/15/2017 12:46 PM by Myles Rosenthal: Ekg - [facility] Scan on 10/15/2017 12:46 PM by Sallee Provencal L: Ekg - [facility]   Progress Notes by Burtis Junes, NP at 10/15/2017 11:30 AM   Author: Burtis Junes, NP Author Type: Nurse Practitioner Filed: 10/15/2017 12:45 PM  Note Status: Signed Cosign: Cosign Not Required Encounter Date: 10/15/2017  Editor: Burtis Junes, NP (Nurse Practitioner)  Expand All Collapse All       CARDIOLOGY OFFICE NOTE  Date:  10/15/2017    Denise Cuevas Date of Birth: Dec 22, 1950 Medical Record #[id number]  PCP:  [name], MD             Cardiologist:  [facility]        Chief Complaint  Patient presents with  . Edema    Work in visit - seen for Dr. [name]    History of Present Illness: Denise Cuevas is a 66 y.o. female who presents today for a work in visit.  Seen for Dr. [name].   She has a history of known CAD with prior CABG 11/2011 with LIMA to LAD, SVG to obtuse marginal. Cardiac catheterization in 2013 showing 95% SVG to obtuse marginal at the distal  insertion.Underwent drug-eluting stent to the proximal circumflex on 03/24/12. She was then taken back to the cath lab again on 05/06/12 and had DES stent placement to distal LAD. Widely patent RCA. Other issues include anxiety, diabetes, hypothyroidism and HLD.   Echocardiogram on 01/2015 showed normal LV function at 60-65%.  She saw [facility] on 05/29/15 with chest tightness, memory issues. Myoview updated - continued on medical management by Dr. [name].   Last seen back in January by Dr. [name] - some cramps - basically noted to feel poorly from the waist down.   Comes in today. Here alone. Acutely ill. Has no current PCP. Has been to the [facility] walk in clinic and trying to get established - can't be seen until December. She says she is here "because something is wrong with me" but does not know what is wrong and was thus sent here.  Told at [facility] that she had acute bronchitis within the past week along with COPD and was told to come here. I do not have any records. She tells me she has had 3 heart attacks in her past. She is using NTG. More issues with her  legs - says her legs get hot, itch and then breakout. For the past 3 weeks - more chest pain - midsternal - will last for hours - taking NTG with relief. Worse with stress. Her pain is identical to her "heart attack" pain. Last spell was yesterday. Says she just feels miserable. She has had 2 recent rounds of antibiotics. She can't stay awake. She feels sleepy. Thirsty.       Past Medical History:  Diagnosis Date  . Angina   . Asthma   . 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 Coronary artery disease    s/p CABG  . Depression   . Diabetes mellitus    type 2  . Edema extremities    consistent edema lower legs, feet, and right quadrant abdominal pain-"goes and comes"  . Fibromyalgia   . GERD (gastroesophageal reflux disease)   . H/O hiatal hernia   . Headache(784.0)    occ. generalized  . Heart murmur     . Hypercholesteremia   . Hypertension   . Hypothyroid   . Myocardial infarction [facility]) 11/2011   MI x2- 3'97,6'73 coronary stent(chest pain episode at time)  . PONV (postoperative nausea and vomiting)          Past Surgical History:  Procedure Laterality Date  . CHOLECYSTECTOMY     open many yrs ago  . CORONARY ANGIOPLASTY WITH STENT PLACEMENT  05/06/2012  . KNEE SURGERY Right    scope surgery     Medications: ActiveMedications      Current Meds  Medication Sig  . albuterol (PROVENTIL HFA;VENTOLIN HFA) 108 (90 BASE) MCG/ACT inhaler Inhale 2 puffs into the lungs every 6 (six) hours as needed for wheezing or shortness of breath.  Marland Kitchen albuterol (PROVENTIL) (2.5 MG/3ML) 0.083% nebulizer solution Take 3 mLs (2.5 mg total) by nebulization every 2 (two) hours as needed for wheezing.  Marland Kitchen aspirin 81 MG tablet Take 81 mg by mouth daily.  Marland Kitchen atorvastatin (LIPITOR) 40 MG tablet Take 40 mg by mouth daily.  . budesonide-formoterol (SYMBICORT) 80-4.5 MCG/ACT inhaler Inhale 2 puffs into the lungs 2 (two) times daily.  . DULoxetine (CYMBALTA) 30 MG capsule Take 90 mg by mouth daily with lunch. In conjunction with one 60 mg capsule to equal a total of 90 milligrams  . DULoxetine (CYMBALTA) 60 MG capsule Take 90 mg by mouth daily with lunch. In conjunction with one 30 mg capsule to equal a total of 90 milligrams  . furosemide (LASIX) 40 MG tablet Take 1 tablet (40 mg total) by mouth daily.  Marland Kitchen HUMALOG MIX 75/25 KWIKPEN (75-25) 100 UNIT/ML Kwikpen INJECT 60 UNITS SUBCUTANEOUSLY BEFORE BREAKFAST AND 60 UNITS BEFORE EVENING MEAL  . hydrocortisone cream 1 % Apply topically 2 (two) times daily. Apply to right lower leg (Patient taking differently: Apply 1 application topically 2 (two) times daily as needed for itching. Apply to right lower leg)  . JARDIANCE 25 MG TABS tablet Take 25 mg daily by mouth.  . levothyroxine (SYNTHROID, LEVOTHROID) 50 MCG tablet Take 50 mcg by mouth daily before  breakfast.  . metoprolol tartrate (LOPRESSOR) 25 MG tablet Take 12.5 mg by mouth 2 (two) times daily.   . nitroGLYCERIN (NITROSTAT) 0.4 MG SL tablet Place 0.4 mg under the tongue every 5 (five) minutes as needed. For chest pain  . omeprazole-sodium bicarbonate (ZEGERID) 40-1100 MG per capsule Take 1 capsule by mouth daily before breakfast.  . oxycodone (OXY-IR) 5 MG capsule Take 5 mg by mouth  every 6 (six) hours as needed for pain.   . promethazine-dextromethorphan (PROMETHAZINE-DM) 6.25-15 MG/5ML syrup Take 5 mLs by mouth 4 (four) times daily as needed for cough.  . sitaGLIPtin-metformin (JANUMET) 50-1000 MG per tablet Take 1 tablet by mouth 2 (two) times daily with a meal.  . spironolactone (ALDACTONE) 25 MG tablet Take 1 tablet (25 mg total) by mouth daily.  . valsartan (DIOVAN) 80 MG tablet Take 1 tablet (80 mg total) by mouth daily.       Allergies:      Allergies  Allergen Reactions  . Exenatide Nausea And Vomiting  . Ace Inhibitors Other (See Comments)    pseudoasthma    Social History: The patient  reports that  has never smoked. she has never used smokeless tobacco. She reports that she does not drink alcohol or use drugs.   Family History: The patient's family history includes Emphysema in her father; Heart disease (age of onset: 45) in her father; Peripheral vascular disease (age of onset: 59) in her mother.   Review of Systems: Please see the history of present illness.   Otherwise, the review of systems is positive for none.   All other systems are reviewed and negative.   Physical Exam: VS:  BP 108/64 (BP Location: Left Arm, Patient Position: Sitting, Cuff Size: Normal)   Pulse 80   Ht 5\' 2"  (1.575 m)   Wt 174 lb 1.9 oz (79 kg)   LMP  (LMP Unknown)   BMI 31.85 kg/m  .  BMI Body mass index is 31.85 kg/m.     Wt Readings from Last 3 Encounters:  10/15/17 174 lb 1.9 oz (79 kg)  03/10/17 163 lb (73.9 kg)  12/20/16 165 lb (74.8 kg)    General: She  is acutely ill.  Color is quite sallow/yellow. Alert and in no acute distress. Weight is up. Horrible incessant cough noted.  HEENT: Normal.  Neck: Supple, no JVD, carotid bruits, or masses noted.  Cardiac: Regular rate and rhythm. Outflow murmur. +1 edema. Legs are red/swollen Respiratory:  Marked cough - cannot take a deep breath. Unable to really assess the lungs because of this persistent cough.  GI: Soft and nontender.  MS: No deformity or atrophy. Gait and ROM intact.  Skin: Warm and dry. Color is quite sallow.  Neuro:  Strength and sensation are intact and no gross focal deficits noted.  Psych: Alert, appropriate and with normal affect.   LABORATORY DATA:  EKG:  EKG is ordered today. This demonstrates NSR, poor R wave progression - reviewed with Dr. Radford Pax.  RecentLabs       Lab Results  Component Value Date   WBC 3.5 (L) 03/10/2017   HGB 9.7 (L) 03/10/2017   HCT 30.2 (L) 03/10/2017   PLT 77 (L) 03/10/2017   GLUCOSE 318 (H) 03/10/2017   CHOL 152 12/01/2012   TRIG 46 12/01/2012   HDL 59 12/01/2012   LDLCALC 84 12/01/2012   ALT 39 11/19/2016   AST 78 (H) 11/19/2016   NA 137 03/10/2017   K 3.5 03/10/2017   CL 104 03/10/2017   CREATININE 0.82 03/10/2017   BUN 7 03/10/2017   CO2 24 03/10/2017   TSH 3.084 11/19/2016   INR 1.28 11/19/2016   HGBA1C 8.6 (H) 11/19/2016       BNP (last 3 results) RecentLabs(withinlast365days)     Recent Labs    11/19/16 2024  BNP 219.5*      ProBNP (last 3 results) RecentLabs(withinlast365days)  No results  for input(s): PROBNP in the last 8760 hours.     Other Studies Reviewed Today:  Echo Study Conclusions from 12/2015  - Left ventricle: The cavity size was normal. Wall thickness was normal. Systolic function was normal. The estimated ejection fraction was in the range of 60% to 65%. The study is not technically sufficient to allow evaluation of LV  diastolic function. - Aortic valve: There was mild stenosis. Valve area (VTI): 0.97 cm^2. Valve area (Vmax): 1.05 cm^2. Valve area (Vmean): 0.98 cm^2. - Mitral valve: Calcified annulus. There was mild regurgitation. - Left atrium: The atrium was mildly dilated. - Atrial septum: No defect or patent foramen ovale was identified. - Impressions: Gradients actually slightly lower than 02/03/15.  Impressions:  - Gradients actually slightly lower than 02/03/15.  Myoview Study Highlights from 05/2015    Defect 1: There is a medium defect of moderate severity present in the basal inferolateral location.  Defect 2: There is a small defect of moderate severity present in the apical lateral and apex location.  Findings consistent with prior myocardial infarction.  This is an intermediate risk study.  The left ventricular ejection fraction is normal (55-65%).  Abnormal study. Old infarction noted, no ischemia     ANGIOGRAPHIC DATA 04/2012:  The left main coronary artery is short, but widely patent..  The left anterior descending artery is occluded after the origin of the large first diagonal. The first diagonal has a moderate, 30-40% stenosis proximally.   The left circumflex artery is a large vessel. The proximal stent is widely patent and appears well size. The distal stent in the large OM is also widely patent. The AV continuation artery is widely patent.  The right coronary artery is a medium-sized, dominant vessel. There is some catheter-induced spasm proximally.  The SVG to OM is occluded.  The LIMA to LAD is widely patent. In the anastomotic site, there is mild plaque in some views. In the distal LAD, there is a focal 80% stenosis.  PCI NARRATIVE: Angiomax was given for anticoagulation. A CLS 3.0 guiding catheters used. A pro-water wire was placed down the circumflex into the large OM across both stents. An intravascular ultrasound catheter was  advanced to the second more distal stent to the midportion. A pullback was performed. We did not advance it further due to the small nature of the very distal obtuse marginal. The intravascular ultrasound shows that the stents in the circumflex are well apposed. There are no stent struts which appear free from vessel wall.  And IMA guiding catheters used to engage the left internal mammary artery with some difficulty. A pro-water wire was placed into the LIMA into the LAD. This could not successfully navigate always into the distal LAD. A BMW wire was then advanced across the 80% stenosis in the distal LAD. This was predilated with a 2.0 x 12 emerge balloon. A 2.25 x 12 Promus stent was then inflated to 11 atmospheres for 18 seconds across the diseased area . There still appeared to be a waist in the stent. This postdilated with a 2.5 x 9 [employer] sprinter balloon to 16 atmospheres. There was no residual stenosis. TIMI-3 flow was maintained throughout. Lesion length was 8 mm.   Severeal doses of IC NTG were given to treat vasospasm in the circumflex from the IVUS and in the LAD from the interventional equipment.  IMPRESSIONS:  1. Widely patent right coronary artery.  2. Successful drug-eluting stent placement to the distal left anterior descending artery. 3. Well  apposed stents in the left circumflex artery and obtuse marginal. LVEDP 9 mm Hg. Left ventriculogram not performed.   RECOMMENDATION:She should continue on dual antiplatelet therapy for at least a year. Continue aggressive secondary prevention including diabetes control. She will be watched overnight. .  ASSESSMENT AND PLAN:  1. Chest pain - last spell yesterday - responsive to NTG - unclear to me how much of her anxiety is playing a role here - but has known CAD - last Myoview was intermediate with old infarction - no ischemia. Referring on for admission - will more than likely require repeat cardiac cath once  her medical issues are sorted out.   2. Acute bronchitis - incessant cough. She tells me she has had 2 rounds of antibiotics.  May need steroid therapy. Would ask hospitalist to see.   3. Known CAD - prior CABG in 2012 - has had stents in 2013. Now with recurring chest pain/angina - she says is identical to her prior chest pain "heart attack" syndrome - will need cardiac cath at some point during this admission   4. COPD - new diagnosis - no details known  5. Valvular heart disease - would probably benefit from repeat echo as well.   6. Probable cellulitis - needs treatment. She is diabetic.   7. DM - last A1C noted in Epic was almost 9. Was to have seen Dr. Buddy Duty today  8. Anxiety - she reports lots of stressors in her life - we did not dive into this issue today.   Current medicines are reviewed with the patient today.  The patient does not have concerns regarding medicines other than what has been noted above.  The following changes have been made:  See above.  Labs/ tests ordered today include:   No orders of the defined types were placed in this encounter.    Disposition:   Discussed with Dr. Radford Pax (DOD) here in the office - she agrees with sending for admission for further evaluation/treatment. Referred on for admission to [facility]. No beds available. She has been sent by private car to the [facility] ER. Triage nurse alerted. Cardmaster alerted as well. Would ask hospitalist to see to help with medical issues.    Patient is agreeable to this plan and will call if any problems develop in the interim.   SignedTruitt Merle, NP  10/15/2017 12:38 PM  [facility] [address] [place], [place]  [address] Phone: [phone] Fax: [phone]           Patient Instructions by Burtis Junes, NP at 10/15/2017 11:30 AM   Author: Burtis Junes, NP Author Type: Nurse Practitioner Filed: 10/15/2017 12:34 PM  Note  Status: Addendum Cosign: Cosign Not Required Encounter Date: 10/15/2017  Editor: Burtis Junes, NP (Nurse Practitioner)  Prior Versions: 1. Burtis Junes, NP (Nurse Practitioner) at 10/15/2017 11:49 AM - Signed    We are admitting you to the hospital today.   Call the [place] office at [phone] if you have any questions, problems or concerns.         Instructions   We are admitting you to the hospital today.   Call the [place] office at [phone] if you have any questions, problems or concerns.       Communications      Chart Routed to Jerline Pain, MD and 2 others  Media  Electronic signature on 10/15/2017 11:47 AM   Communication Routing History   There are no sent or routed communications associated with this encounter.  Orders Placed      EKG 12-Lead  Medication Changes        (Completed Course)       (Completed Course)    Medication List  Visit Diagnoses      Unstable angina pectoris ([place])    Swelling    Coronary artery disease involving native coronary artery of native heart with other form of angina pectoris ([place])    Murmur    Cellulitis, unspecified cellulitis site    Acute bronchitis, unspecified organism    Problem List  Level of Service   Level of Service  PR OFFICE OUTPATIENT VISIT 25 MINUTES [05110]  LOS History  All Charges for This Encounter   Code  99214  Description: PR OFFICE OUTPATIENT VISIT 25 MINUTES  Service Date: 10/15/2017  Service Provider: Burtis Junes, NP  Qty: 1  [id number]  Description: PR ELECTROCARDIOGRAM, COMPLETE  Service Date: 10/15/2017  Service Provider: Burtis Junes, NP  Qty: 1

## 2017-10-15 NOTE — Patient Instructions (Addendum)
We are admitting you to the hospital today.   Call the [facility] office at [phone] if you have any questions, problems or concerns.      

## 2017-10-15 NOTE — Progress Notes (Signed)
CARDIOLOGY OFFICE NOTE  Date:  10/15/2017    Denise Cuevas Date of Birth: July 07, 1951 Medical Record #[id number]  PCP:  [name], MD  Cardiologist:  [facility]    Chief Complaint  Patient presents with  . Edema    Work in visit - seen for Dr. [name]    History of Present Illness: Denise Cuevas is a 66 y.o. female who presents today for a work in visit.  Seen for Dr. [name].   She has a history of known CAD with prior CABG 11/2011 with LIMA to LAD, SVG to obtuse marginal. Cardiac catheterization in 2013 showing 95% SVG to obtuse marginal at the distal insertion. Underwent drug-eluting stent to the proximal circumflex on 03/24/12. She was then taken back to the cath lab again on 05/06/12 and had DES stent placement to distal LAD. Widely patent RCA. Other issues include anxiety, diabetes, hypothyroidism and HLD.   Echocardiogram on 01/2015 showed normal LV function at 60-65%.  She saw [facility] on 05/29/15 with chest tightness, memory issues. Myoview updated - continued on medical management by Dr. [name].   Last seen back in January by Dr. [name] - some cramps - basically noted to feel poorly from the waist down.   Comes in today. Here alone. Acutely ill. Has no current PCP. Has been to the [facility] walk in clinic and trying to get established - can't be seen until December. She says she is here "because something is wrong with me" but does not know what is wrong and was thus sent here.  Told at [facility] that she had acute bronchitis within the past week along with COPD and was told to come here. I do not have any records. She tells me she has had 3 heart attacks in her past. She is using NTG. More issues with her legs - says her legs get hot, itch and then breakout. For the past 3 weeks - more chest pain - midsternal - will last for hours - taking NTG with relief. Worse with stress. Her pain is identical to her "heart attack" pain. Last spell was yesterday. Says she just feels  miserable. She has had 2 recent rounds of antibiotics. She can't stay awake. She feels sleepy. Thirsty.   Past Medical History:  Diagnosis Date  . Angina   . Asthma   . 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 Coronary artery disease    s/p CABG  . Depression   . Diabetes mellitus    type 2  . Edema extremities    consistent edema lower legs, feet, and right quadrant abdominal pain-"goes and comes"  . Fibromyalgia   . GERD (gastroesophageal reflux disease)   . H/O hiatal hernia   . Headache(784.0)    occ. generalized  . Heart murmur   . Hypercholesteremia   . Hypertension   . Hypothyroid   . Myocardial infarction [facility]) 11/2011   MI x2- 2'45,8'09 coronary stent(chest pain episode at time)  . PONV (postoperative nausea and vomiting)     Past Surgical History:  Procedure Laterality Date  . CHOLECYSTECTOMY     open many yrs ago  . CORONARY ANGIOPLASTY WITH STENT PLACEMENT  05/06/2012  . KNEE SURGERY Right    scope surgery     Medications: Current Meds  Medication Sig  . albuterol (PROVENTIL HFA;VENTOLIN HFA) 108 (90 BASE) MCG/ACT inhaler Inhale 2 puffs into the lungs every 6 (six) hours as needed for wheezing or shortness  of breath.  Marland Kitchen albuterol (PROVENTIL) (2.5 MG/3ML) 0.083% nebulizer solution Take 3 mLs (2.5 mg total) by nebulization every 2 (two) hours as needed for wheezing.  Marland Kitchen aspirin 81 MG tablet Take 81 mg by mouth daily.  Marland Kitchen atorvastatin (LIPITOR) 40 MG tablet Take 40 mg by mouth daily.  . budesonide-formoterol (SYMBICORT) 80-4.5 MCG/ACT inhaler Inhale 2 puffs into the lungs 2 (two) times daily.  . DULoxetine (CYMBALTA) 30 MG capsule Take 90 mg by mouth daily with lunch. In conjunction with one 60 mg capsule to equal a total of 90 milligrams  . DULoxetine (CYMBALTA) 60 MG capsule Take 90 mg by mouth daily with lunch. In conjunction with one 30 mg capsule to equal a total of 90 milligrams  . furosemide (LASIX) 40 MG tablet Take 1 tablet (40 mg total) by  mouth daily.  Marland Kitchen HUMALOG MIX 75/25 KWIKPEN (75-25) 100 UNIT/ML Kwikpen INJECT 60 UNITS SUBCUTANEOUSLY BEFORE BREAKFAST AND 60 UNITS BEFORE EVENING MEAL  . hydrocortisone cream 1 % Apply topically 2 (two) times daily. Apply to right lower leg (Patient taking differently: Apply 1 application topically 2 (two) times daily as needed for itching. Apply to right lower leg)  . JARDIANCE 25 MG TABS tablet Take 25 mg daily by mouth.  . levothyroxine (SYNTHROID, LEVOTHROID) 50 MCG tablet Take 50 mcg by mouth daily before breakfast.  . metoprolol tartrate (LOPRESSOR) 25 MG tablet Take 12.5 mg by mouth 2 (two) times daily.   . nitroGLYCERIN (NITROSTAT) 0.4 MG SL tablet Place 0.4 mg under the tongue every 5 (five) minutes as needed. For chest pain  . omeprazole-sodium bicarbonate (ZEGERID) 40-1100 MG per capsule Take 1 capsule by mouth daily before breakfast.  . oxycodone (OXY-IR) 5 MG capsule Take 5 mg by mouth every 6 (six) hours as needed for pain.   . promethazine-dextromethorphan (PROMETHAZINE-DM) 6.25-15 MG/5ML syrup Take 5 mLs by mouth 4 (four) times daily as needed for cough.  . sitaGLIPtin-metformin (JANUMET) 50-1000 MG per tablet Take 1 tablet by mouth 2 (two) times daily with a meal.  . spironolactone (ALDACTONE) 25 MG tablet Take 1 tablet (25 mg total) by mouth daily.  . valsartan (DIOVAN) 80 MG tablet Take 1 tablet (80 mg total) by mouth daily.     Allergies: Allergies  Allergen Reactions  . Exenatide Nausea And Vomiting  . Ace Inhibitors Other (See Comments)    pseudoasthma    Social History: The patient  reports that  has never smoked. she has never used smokeless tobacco. She reports that she does not drink alcohol or use drugs.   Family History: The patient's family history includes Emphysema in her father; Heart disease (age of onset: 59) in her father; Peripheral vascular disease (age of onset: 3) in her mother.   Review of Systems: Please see the history of present illness.    Otherwise, the review of systems is positive for none.   All other systems are reviewed and negative.   Physical Exam: VS:  BP 108/64 (BP Location: Left Arm, Patient Position: Sitting, Cuff Size: Normal)   Pulse 80   Ht 5\' 2"  (1.575 m)   Wt 174 lb 1.9 oz (79 kg)   LMP  (LMP Unknown)   BMI 31.85 kg/m  .  BMI Body mass index is 31.85 kg/m.  Wt Readings from Last 3 Encounters:  10/15/17 174 lb 1.9 oz (79 kg)  03/10/17 163 lb (73.9 kg)  12/20/16 165 lb (74.8 kg)    General: She is acutely ill.  Color is quite sallow/yellow. Alert and in no acute distress. Weight is up. Horrible incessant cough noted.  HEENT: Normal.  Neck: Supple, no JVD, carotid bruits, or masses noted.  Cardiac: Regular rate and rhythm. Outflow murmur. +1 edema. Legs are red/swollen Respiratory:  Marked cough - cannot take a deep breath. Unable to really assess the lungs because of this persistent cough.  GI: Soft and nontender.  MS: No deformity or atrophy. Gait and ROM intact.  Skin: Warm and dry. Color is quite sallow.  Neuro:  Strength and sensation are intact and no gross focal deficits noted.  Psych: Alert, appropriate and with normal affect.   LABORATORY DATA:  EKG:  EKG is ordered today. This demonstrates NSR, poor R wave progression - reviewed with Dr. Radford Pax.  Lab Results  Component Value Date   WBC 3.5 (L) 03/10/2017   HGB 9.7 (L) 03/10/2017   HCT 30.2 (L) 03/10/2017   PLT 77 (L) 03/10/2017   GLUCOSE 318 (H) 03/10/2017   CHOL 152 12/01/2012   TRIG 46 12/01/2012   HDL 59 12/01/2012   LDLCALC 84 12/01/2012   ALT 39 11/19/2016   AST 78 (H) 11/19/2016   NA 137 03/10/2017   K 3.5 03/10/2017   CL 104 03/10/2017   CREATININE 0.82 03/10/2017   BUN 7 03/10/2017   CO2 24 03/10/2017   TSH 3.084 11/19/2016   INR 1.28 11/19/2016   HGBA1C 8.6 (H) 11/19/2016     BNP (last 3 results) Recent Labs    11/19/16 2024  BNP 219.5*    ProBNP (last 3 results) No results for input(s): PROBNP in the  last 8760 hours.   Other Studies Reviewed Today:  Echo Study Conclusions from 12/2015  - Left ventricle: The cavity size was normal. Wall thickness was   normal. Systolic function was normal. The estimated ejection   fraction was in the range of 60% to 65%. The study is not   technically sufficient to allow evaluation of LV diastolic   function. - Aortic valve: There was mild stenosis. Valve area (VTI): 0.97   cm^2. Valve area (Vmax): 1.05 cm^2. Valve area (Vmean): 0.98   cm^2. - Mitral valve: Calcified annulus. There was mild regurgitation. - Left atrium: The atrium was mildly dilated. - Atrial septum: No defect or patent foramen ovale was identified. - Impressions: Gradients actually slightly lower than 02/03/15.  Impressions:  - Gradients actually slightly lower than 02/03/15.  Myoview Study Highlights from 05/2015    Defect 1: There is a medium defect of moderate severity present in the basal inferolateral location.  Defect 2: There is a small defect of moderate severity present in the apical lateral and apex location.  Findings consistent with prior myocardial infarction.  This is an intermediate risk study.  The left ventricular ejection fraction is normal (55-65%).  Abnormal study. Old infarction noted, no ischemia     ANGIOGRAPHIC DATA 04/2012:    The left main coronary artery is short, but widely patent..  The left anterior descending artery is occluded after the origin of the large first diagonal.  The first diagonal has a moderate, 30-40% stenosis proximally.    The left circumflex artery is a large vessel.  The proximal stent is widely patent and appears well size.  The distal stent in the large OM is also widely patent.  The AV continuation artery is widely patent.  The right coronary artery is a medium-sized, dominant vessel.  There is some catheter-induced spasm proximally.  The SVG  to OM is occluded.  The LIMA to LAD is widely patent.  In the  anastomotic site, there is mild plaque in some views.  In the distal LAD, there is a focal 80% stenosis.  PCI NARRATIVE: Angiomax was given for anticoagulation.  A CLS 3.0 guiding catheters used.  A pro-water wire was placed down the circumflex into the large OM across both stents.  An intravascular ultrasound catheter was advanced to the second more distal stent to the midportion.  A pullback was performed.  We did not advance it further due to the small nature of the very distal obtuse marginal.  The intravascular ultrasound shows that the stents in the circumflex are well apposed.  There are no stent struts which appear free from vessel wall.  And IMA guiding catheters used to engage the left internal mammary artery with some difficulty.  A pro-water wire was placed into the LIMA into the LAD.  This could not successfully navigate always into the distal LAD.  A BMW wire was then advanced across the 80% stenosis in the distal LAD.  This was predilated with a 2.0 x 12 emerge balloon. A 2.25 x 12 Promus stent was then inflated to 11 atmospheres for 18 seconds across the diseased area .  There still appeared to be a waist in the stent.  This postdilated with a 2.5 x 9 [place] sprinter balloon to 16 atmospheres.  There was no residual stenosis.  TIMI-3 flow was maintained throughout.  Lesion length was 8 mm.    Severeal doses of IC NTG were given to treat vasospasm in the circumflex from the IVUS and in the LAD from the interventional equipment.  IMPRESSIONS:  1.  Widely patent right coronary artery.   2.  Successful drug-eluting stent placement to the distal  left anterior descending artery. 3.  Well apposed stents in the left circumflex artery and obtuse marginal.  LVEDP 9 mm Hg.  Left ventriculogram not performed.   RECOMMENDATION:   She should continue on dual antiplatelet therapy for at least a year.  Continue aggressive secondary prevention including diabetes control.  She will be watched  overnight. .  ASSESSMENT AND PLAN:  1. Chest pain - last spell yesterday - responsive to NTG - unclear to me how much of her anxiety is playing a role here - but has known CAD - last Myoview was intermediate with old infarction - no ischemia. Referring on for admission - will more than likely require repeat cardiac cath once her medical issues are sorted out.   2. Acute bronchitis - incessant cough. She tells me she has had 2 rounds of antibiotics.  May need steroid therapy. Would ask hospitalist to see.   3. Known CAD - prior CABG in 2012 - has had stents in 2013. Now with recurring chest pain/angina - she says is identical to her prior chest pain "heart attack" syndrome - will need cardiac cath at some point during this admission   4. COPD - new diagnosis - no details known  5. Valvular heart disease - would probably benefit from repeat echo as well.   6. Probable cellulitis - needs treatment. She is diabetic.   7. DM - last A1C noted in Epic was almost 9. Was to have seen Dr. Buddy Duty today  8. Anxiety - she reports lots of stressors in her life - we did not dive into this issue today.   Current medicines are reviewed with the patient today.  The patient does not  have concerns regarding medicines other than what has been noted above.  The following changes have been made:  See above.  Labs/ tests ordered today include:   No orders of the defined types were placed in this encounter.    Disposition:   Discussed with Dr. Radford Pax (DOD) here in the office - she agrees with sending for admission for further evaluation/treatment. Referred on for admission to [facility]. No beds available. She has been sent by private car to the [facility] ER. Triage nurse alerted. Cardmaster alerted as well. Would ask hospitalist to see to help with medical issues.    Patient is agreeable to this plan and will call if any problems develop in the interim.   SignedTruitt Merle, NP  10/15/2017 12:38 PM  [facility] [address] [place], [place]  [address] Phone: [phone] Fax: [phone]

## 2017-10-15 NOTE — ED Notes (Signed)
Lactic acid result given to Dr. Thomasene Lot

## 2017-10-15 NOTE — ED Notes (Signed)
Pt ambulated to bathroom 

## 2017-10-15 NOTE — ED Provider Notes (Signed)
[place] EMERGENCY DEPARTMENT Provider Note   CSN: [id number] Arrival date & time: 10/15/17  1243     History   Chief Complaint Chief Complaint  Patient presents with  . Shortness of Breath  . Chest Pain  . Leg Swelling    HPI Denise Cuevas is a 66 y.o. female.  HPI   66 year old female with history of angina, asthma, CAD status post CABG, diabetes, prior MI, cirrhosis of the liver presenting for evaluation of shortness of breath.  Patient report for the past week she has had cough productive with yellow sputum, increased shortness of breath, Pleuritic chest pain and generalized weakness.  She endorsed subjective fever.  She was initially seen by her PCP for her symptoms.  She was diagnosed with having bronchitis, was given a prescription for Z-Pak and cough medication which she has finished but it provided no relief.  She received a phone call from Dr. Philis Nettle her to go to the ER for further evaluation and management of her condition.  She does report having bilateral lower extremity swelling for the same duration as mildly tender and itchy.  States she has history of CHF in which she has been compliant with her Lasix.  Also has history of asthma.  States she has been wheezing.  She did endorse having intermittent diarrhea for the past 2 weeks.  Denies nausea and vomiting.   Past Medical History:  Diagnosis Date  . Angina   . Asthma   . 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 Coronary artery disease    s/p CABG  . Depression   . Diabetes mellitus    type 2  . Edema extremities    consistent edema lower legs, feet, and right quadrant abdominal pain-"goes and comes"  . Fibromyalgia   . GERD (gastroesophageal reflux disease)   . H/O hiatal hernia   . Headache(784.0)    occ. generalized  . Heart murmur   . Hypercholesteremia   . Hypertension   . Hypothyroid   . Myocardial infarction [facility]) 11/2011   MI x2- 4'76,5'46 coronary stent(chest pain  episode at time)  . PONV (postoperative nausea and vomiting)     Patient Active Problem List   Diagnosis Date Noted  . Cirrhosis of liver not due to alcohol ([place]) 11/20/2016  . Pancytopenia ([place]) 11/20/2016  . Protein-calorie malnutrition, severe ([place]) 11/20/2016  . Acute diastolic CHF (congestive heart failure) ([place])   . Congestive heart failure ([place]) 11/19/2016  . Chest pain 05/29/2015  . Memory loss 05/29/2015  . Essential hypertension 03/20/2015  . Dyspnea 02/02/2015  . Acute respiratory distress 02/02/2015  . Heart murmur   . Depression   . GERD (gastroesophageal reflux disease)   . Unstable angina pectoris ([place]) 11/25/2011  . Hypothyroidism   . CAD (coronary artery disease)   . Other and unspecified angina pectoris 11/19/2011  . Type 2 diabetes mellitus, uncontrolled ([place]) 11/19/2011  . Myocardial infarction ([place]) 11/09/2011    Past Surgical History:  Procedure Laterality Date  . CHOLECYSTECTOMY     open many yrs ago  . CORONARY ANGIOPLASTY WITH STENT PLACEMENT  05/06/2012  . KNEE SURGERY Right    scope surgery    OB History    No data available       Home Medications    Prior to Admission medications   Medication Sig Start Date End Date Taking? Authorizing Provider  albuterol (PROVENTIL HFA;VENTOLIN HFA) 108 (90 BASE) MCG/ACT inhaler Inhale 2 puffs  into the lungs every 6 (six) hours as needed for wheezing or shortness of breath. 02/04/15   Ghimire, Henreitta Leber, MD  albuterol (PROVENTIL) (2.5 MG/3ML) 0.083% nebulizer solution Take 3 mLs (2.5 mg total) by nebulization every 2 (two) hours as needed for wheezing. 02/04/15   Jonetta Osgood, MD  aspirin 81 MG tablet Take 81 mg by mouth daily.    [provider]  atorvastatin (LIPITOR) 40 MG tablet Take 40 mg by mouth daily.    [provider]  budesonide-formoterol (SYMBICORT) 80-4.5 MCG/ACT inhaler Inhale 2 puffs into the lungs 2 (two) times daily.    [provider]  DULoxetine  (CYMBALTA) 30 MG capsule Take 90 mg by mouth daily with lunch. In conjunction with one 60 mg capsule to equal a total of 90 milligrams    [provider]  DULoxetine (CYMBALTA) 60 MG capsule Take 90 mg by mouth daily with lunch. In conjunction with one 30 mg capsule to equal a total of 90 milligrams    [provider]  furosemide (LASIX) 40 MG tablet Take 1 tablet (40 mg total) by mouth daily. 11/22/16   Hongalgi, Lenis Dickinson, MD  HUMALOG MIX 75/25 KWIKPEN (75-25) 100 UNIT/ML Kwikpen INJECT 60 UNITS SUBCUTANEOUSLY BEFORE BREAKFAST AND 60 UNITS BEFORE EVENING MEAL 09/11/17   [provider]  hydrocortisone cream 1 % Apply topically 2 (two) times daily. Apply to right lower leg Patient taking differently: Apply 1 application topically 2 (two) times daily as needed for itching. Apply to right lower leg 11/22/16   Hongalgi, Lenis Dickinson, MD  Insulin Glargine (LANTUS SOLOSTAR) 100 UNIT/ML Solostar Pen Inject 50 Units into the skin 2 (two) times daily. Patient taking differently: Inject 48 Units into the skin 3 (three) times daily.  11/22/16   Hongalgi, Lenis Dickinson, MD  JARDIANCE 25 MG TABS tablet Take 25 mg daily by mouth. 10/12/17   [provider]  levothyroxine (SYNTHROID, LEVOTHROID) 50 MCG tablet Take 50 mcg by mouth daily before breakfast.    [provider]  metoprolol tartrate (LOPRESSOR) 25 MG tablet Take 12.5 mg by mouth 2 (two) times daily.     [provider]  nitroGLYCERIN (NITROSTAT) 0.4 MG SL tablet Place 0.4 mg under the tongue every 5 (five) minutes as needed. For chest pain    [provider]  omeprazole-sodium bicarbonate (ZEGERID) 40-1100 MG per capsule Take 1 capsule by mouth daily before breakfast.    [provider]  oxycodone (OXY-IR) 5 MG capsule Take 5 mg by mouth every 6 (six) hours as needed for pain.     [provider]  promethazine-dextromethorphan (PROMETHAZINE-DM) 6.25-15 MG/5ML syrup Take 5 mLs by mouth 4  (four) times daily as needed for cough. 03/11/17   Lawyer, Harrell Gave, PA-C  sitaGLIPtin-metformin (JANUMET) 50-1000 MG per tablet Take 1 tablet by mouth 2 (two) times daily with a meal.    [provider]  spironolactone (ALDACTONE) 25 MG tablet Take 1 tablet (25 mg total) by mouth daily. 11/23/16   Modena Jansky, MD    Family History Family History  Problem Relation Age of Onset  . Heart disease Father 61       died of MI  . Emphysema Father        smoked  . Peripheral vascular disease Mother 33       bilaterial amputations    Social History Social History   Tobacco Use  . Smoking status: Never Smoker  . Smokeless tobacco: Never Used  Substance Use Topics  . Alcohol use: No  . Drug use: No     Allergies   Exenatide and Ace inhibitors   Review of Systems Review of Systems  All other systems reviewed and are negative.    Physical Exam Updated Vital Signs BP 90/63 (BP Location: Left Arm)   Pulse 75   Temp 98 F (36.7 C) (Oral)   Resp 16   LMP  (LMP Unknown)   SpO2 100%   Physical Exam  Constitutional: She appears well-developed and well-nourished. She appears ill. No distress.  HENT:  Head: Atraumatic.  Mouth is dry  Eyes: Conjunctivae are normal.  Neck: Neck supple.  Cardiovascular: Normal rate and regular rhythm.  Murmur heard. Pulmonary/Chest: No stridor. She has decreased breath sounds. She has wheezes. She has rhonchi. She has rales.  Abdominal: Soft. She exhibits no distension. There is no guarding.  Musculoskeletal:       Right lower leg: She exhibits edema (1+ peripheral pitting edema to bilateral lower extremity with mild erythema to lower extremities.  Intact distal pulses.).  Neurological: She is alert.  Skin: No rash noted.  Psychiatric: She has a normal mood and affect.  Nursing note and vitals reviewed.    ED Treatments / Results  Labs (all labs ordered are listed, but only abnormal results are displayed) Labs Reviewed    BASIC METABOLIC PANEL - Abnormal; Notable for the following components:      Result Value   Potassium 3.2 (*)    Glucose, Bld 360 (*)    Creatinine, Ser 1.07 (*)    Calcium 7.9 (*)    GFR calc non Af Amer 54 (*)    All other components within normal limits  CBC - Abnormal; Notable for the following components:   Hemoglobin 9.0 (*)    HCT 30.6 (*)    MCV 74.8 (*)    MCH 22.0 (*)    MCHC 29.4 (*)    RDW 18.4 (*)    Platelets 89 (*)    All other components within normal limits  BRAIN NATRIURETIC PEPTIDE - Abnormal; Notable for the following components:   B Natriuretic Peptide 125.3 (*)    All other components within normal limits  I-STAT CG4 LACTIC ACID, ED - Abnormal; Notable for the following components:   Lactic Acid, Venous 2.42 (*)    All other components within normal limits  I-STAT TROPONIN, ED  I-STAT CG4 LACTIC ACID, ED    EKG  EKG Interpretation None     ED ECG REPORT   Date: 10/15/2017  Rate: 75  Rhythm: normal sinus rhythm  QRS Axis: left  Intervals: normal  ST/T Wave abnormalities: nonspecific ST changes  Conduction Disutrbances:none  Narrative Interpretation:   Old EKG Reviewed: unchanged  I have personally reviewed the EKG tracing and agree with the computerized printout as noted.   Radiology Dg Chest 2 View  Result Date: 10/15/2017 CLINICAL DATA:  Chest pain, shortness of breath and cough for 4 weeks. EXAM: CHEST  2 VIEW COMPARISON:  10/12/2017 and prior radiographs FINDINGS: Cardiomegaly and CABG changes again noted. There is no evidence of focal airspace disease, pulmonary edema, suspicious pulmonary nodule/mass, pleural effusion, or pneumothorax. No acute bony abnormalities are identified. IMPRESSION: Cardiomegaly without evidence of acute cardiopulmonary disease. Electronically Signed   By: Margarette Canada M.D.   On: 10/15/2017 13:58    Procedures Procedures (including critical care time)  Medications Ordered in ED Medications  cefTRIAXone  (ROCEPHIN) 1 g in dextrose 5 %  50 mL IVPB (1 g Intravenous New Bag/Given 10/15/17 1513)  albuterol (PROVENTIL) (2.5 MG/3ML) 0.083% nebulizer solution 5 mg (5 mg Nebulization Given 10/15/17 1500)  ipratropium (ATROVENT) nebulizer solution 0.5 mg (0.5 mg Nebulization Given 10/15/17 1501)  dexamethasone (DECADRON) injection 10 mg (10 mg Intravenous Given 10/15/17 1507)  furosemide (LASIX) injection 40 mg (40 mg Intravenous Given 10/15/17 1507)     Initial Impression / Assessment and Plan / ED Course  I have reviewed the triage vital signs and the nursing notes.  Pertinent labs & imaging results that were available during my care of the patient were reviewed by me and considered in my medical decision making (see chart for details).     BP (!) 104/48   Pulse 71   Temp 98 F (36.7 C) (Oral)   Resp 18   LMP  (LMP Unknown)   SpO2 100%    Final Clinical Impressions(s) / ED Diagnoses   Final diagnoses:  Peripheral edema  Unstable angina pectoris (HCC)  Cellulitis of lower extremity, unspecified laterality    ED Discharge Orders    None     2:38 PM Patient here with worsening bronchitic symptoms including shortness of breath and productive cough.  She has lower extremity edema with history of CHF currently on Lasix. Pt appears to have Mild CHF exacerbation with chest pain and potential cellulitis. Plan to provide a duonebs treatment, decadron and will also give 40mg  of Lasix.  Pt recently finished with a course of Zpak.  CXR without focal infiltrates.  Since pt has potential cellulitis vs pulmonary etiology, will give Rocephin abx due to elevated lactic acid of 2.42.  She has normal WBC and no fever.  Will consult for admission.  Care discussed with Dr. Johnney Killian.  3:19 PM Last cardiac echo is 11/20/2016 showing EF of 60-65%.  Mild mitral valve regurgitation.    3:38 PM Appreciate consultation from [facility] Resident who agrees to see pt in the ER and will admit for further care.   Attending Dr. Gwendlyn Deutscher.     Domenic Moras, PA-C 10/15/17 [place], MD 10/17/17 [phone]

## 2017-10-15 NOTE — Progress Notes (Signed)
Patient seen and evaluated with resident. Plan discussed with them as well. Will cosign H&P once done.   First time of contact with patient: 4:25pm today.

## 2017-10-15 NOTE — Progress Notes (Signed)
Nitro ointment and lasix due with BP 98/41 MAP 53. Oncall notified.

## 2017-10-15 NOTE — Progress Notes (Signed)
[place] for Lovenox Indication: chest pain/ACS  Allergies  Allergen Reactions  . Exenatide Nausea And Vomiting  . Ace Inhibitors Other (See Comments)    pseudoasthma    Patient Measurements: Height: 5\' 2"  (157.5 cm) Weight: 170 lb 3.1 oz (77.2 kg)(scale C) IBW/kg (Calculated) : 50.1  Vital Signs: Temp: 98.7 F (37.1 C) (11/07 1752) Temp Source: Oral (11/07 1752) BP: 113/50 (11/07 1752) Pulse Rate: 77 (11/07 1752)  Labs: Recent Labs    10/15/17 1259  HGB 9.0*  HCT 30.6*  PLT 89*  CREATININE 1.07*    Estimated Creatinine Clearance: 51.7 mL/min (A) (by C-G formula based on SCr of 1.07 mg/dL (H)).   Medical History: Past Medical History:  Diagnosis Date  . Angina   . Asthma   . 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 Coronary artery disease    s/p CABG  . Depression   . Diabetes mellitus    type 2  . Edema extremities    consistent edema lower legs, feet, and right quadrant abdominal pain-"goes and comes"  . Fibromyalgia   . GERD (gastroesophageal reflux disease)   . H/O hiatal hernia   . Headache(784.0)    occ. generalized  . Heart murmur   . Hypercholesteremia   . Hypertension   . Hypothyroid   . Myocardial infarction [facility]) 11/2011   MI x2- 1'60,7'37 coronary stent(chest pain episode at time)  . PONV (postoperative nausea and vomiting)     Assessment: 66 yo female to start Lovenox for CP workup. Renal function stable. Hgb 9, PLTc low stable.   Goal of Therapy:  Monitor platelets by anticoagulation protocol: Yes   Plan:  1. Lovenox 1 mg/kg subcutaneously every 12 hours  Vincenza Hews, PharmD, BCPS 10/15/2017, 6:49 PM

## 2017-10-15 NOTE — ED Triage Notes (Addendum)
Pt states "i've had chest pain off and on for years to my mid chest radiating into my arms. I also have had shortness of breath for 1 month, I am unable to walk any distance without giving out." Pt also states "i have bronchitis." Pt has hx of MI. Pt hypotensive, states her BP is normally 993T systolic.  Upon assessment pt bilateral legs with edema, redness to both lower legs. Pt states "i have had low grade fevers at home, for me that is 98" Pt also states she increased her lasix on Sunday.

## 2017-10-16 ENCOUNTER — Observation Stay (HOSPITAL_BASED_OUTPATIENT_CLINIC_OR_DEPARTMENT_OTHER): Payer: Medicare Other

## 2017-10-16 ENCOUNTER — Encounter (HOSPITAL_COMMUNITY): Payer: Self-pay | Admitting: *Deleted

## 2017-10-16 DIAGNOSIS — R0789 Other chest pain: Secondary | ICD-10-CM | POA: Diagnosis not present

## 2017-10-16 DIAGNOSIS — R609 Edema, unspecified: Secondary | ICD-10-CM | POA: Diagnosis not present

## 2017-10-16 DIAGNOSIS — I350 Nonrheumatic aortic (valve) stenosis: Secondary | ICD-10-CM | POA: Diagnosis not present

## 2017-10-16 DIAGNOSIS — I200 Unstable angina: Secondary | ICD-10-CM | POA: Diagnosis not present

## 2017-10-16 DIAGNOSIS — J45991 Cough variant asthma: Secondary | ICD-10-CM | POA: Diagnosis not present

## 2017-10-16 LAB — ECHOCARDIOGRAM COMPLETE
AO mean calculated velocity dopler: 255 cm/s
AV Area VTI index: 0.49 cm2/m2
AV Area VTI: 0.98 cm2
AV Area mean vel: 0.84 cm2
AV Mean grad: 30 mmHg
AV Peak grad: 50 mmHg
AV VEL mean LVOT/AV: 0.42
AV area mean vel ind: 0.45 cm2/m2
AV peak Index: 0.53
AV pk vel: 354 cm/s
AV vel: 0.91
Ao pk vel: 0.49 m/s
Ao-asc: 32 cm
E decel time: 384 msec
E/e' ratio: 26.64
FS: 46 % — AB (ref 28–44)
Height: 62 in
IVS/LV PW RATIO, ED: 1.06
LA ID, A-P, ES: 44 mm
LA diam end sys: 44 mm
LA diam index: 2.37 cm/m2
LA vol A4C: 50.2 ml
LA vol index: 32.3 mL/m2
LA vol: 60 mL
LV E/e' medial: 26.64
LV E/e'average: 26.64
LV PW d: 10.5 mm — AB (ref 0.6–1.1)
LV e' LATERAL: 7.62 cm/s
LVOT SV: 72 mL
LVOT VTI: 35.8 cm
LVOT area: 2.01 cm2
LVOT diameter: 16 mm
LVOT peak VTI: 0.45 cm
LVOT peak grad rest: 12 mmHg
LVOT peak vel: 173 cm/s
MV Dec: 384
MV Peak grad: 16 mmHg
MV pk A vel: 190 m/s
MV pk E vel: 203 m/s
PISA EROA: 0.05 cm2
TAPSE: 25.3 mm
TDI e' lateral: 7.62
TDI e' medial: 7.4
VTI: 135 cm
VTI: 78.7 cm
Valve area index: 0.49
Valve area: 0.91 cm2
Weight: 2700.8 oz

## 2017-10-16 LAB — CBC
HEMATOCRIT: 27 % — AB (ref 36.0–46.0)
Hemoglobin: 8.1 g/dL — ABNORMAL LOW (ref 12.0–15.0)
MCH: 22.3 pg — ABNORMAL LOW (ref 26.0–34.0)
MCHC: 30 g/dL (ref 30.0–36.0)
MCV: 74.2 fL — AB (ref 78.0–100.0)
Platelets: 79 10*3/uL — ABNORMAL LOW (ref 150–400)
RBC: 3.64 MIL/uL — ABNORMAL LOW (ref 3.87–5.11)
RDW: 18.5 % — AB (ref 11.5–15.5)
WBC: 4.6 10*3/uL (ref 4.0–10.5)

## 2017-10-16 LAB — INFLUENZA PANEL BY PCR (TYPE A & B)
INFLAPCR: NEGATIVE
Influenza B By PCR: NEGATIVE

## 2017-10-16 LAB — GLUCOSE, CAPILLARY
Glucose-Capillary: 174 mg/dL — ABNORMAL HIGH (ref 65–99)
Glucose-Capillary: 154 mg/dL — ABNORMAL HIGH (ref 65–99)
Glucose-Capillary: 298 mg/dL — ABNORMAL HIGH (ref 65–99)
Glucose-Capillary: 307 mg/dL — ABNORMAL HIGH (ref 65–99)

## 2017-10-16 LAB — COMPREHENSIVE METABOLIC PANEL
ALK PHOS: 119 U/L (ref 38–126)
ALT: 33 U/L (ref 14–54)
AST: 62 U/L — AB (ref 15–41)
Albumin: 2.1 g/dL — ABNORMAL LOW (ref 3.5–5.0)
Anion gap: 8 (ref 5–15)
BILIRUBIN TOTAL: 2.4 mg/dL — AB (ref 0.3–1.2)
BUN: 15 mg/dL (ref 6–20)
CALCIUM: 8.1 mg/dL — AB (ref 8.9–10.3)
CO2: 26 mmol/L (ref 22–32)
CREATININE: 1.13 mg/dL — AB (ref 0.44–1.00)
Chloride: 106 mmol/L (ref 101–111)
GFR calc Af Amer: 59 mL/min — ABNORMAL LOW (ref >60)
GFR, EST NON AFRICAN AMERICAN: 51 mL/min — AB (ref >60)
Glucose, Bld: 283 mg/dL — ABNORMAL HIGH (ref 65–99)
Potassium: 3.7 mmol/L (ref 3.5–5.1)
Sodium: 140 mmol/L (ref 135–145)
TOTAL PROTEIN: 4.7 g/dL — AB (ref 6.5–8.1)

## 2017-10-16 LAB — TROPONIN I
Troponin I: <0.03 ng/mL (ref <0.03)
Troponin I: <0.03 ng/mL (ref <0.03)

## 2017-10-16 LAB — HIV ANTIBODY (ROUTINE TESTING W REFLEX): HIV SCREEN 4TH GENERATION: NONREACTIVE

## 2017-10-16 MED ORDER — PREDNISONE 20 MG PO TABS
40.0000 mg | ORAL_TABLET | Freq: Every day | ORAL | Status: DC
  Administered 2017-10-17 – 2017-10-19 (×3): 40 mg via ORAL
  Filled 2017-10-16 (×3): qty 2

## 2017-10-16 MED ORDER — LEVOFLOXACIN 500 MG PO TABS
500.0000 mg | ORAL_TABLET | Freq: Every day | ORAL | Status: AC
Start: 2017-10-17 — End: 2017-10-19
  Administered 2017-10-17 – 2017-10-19 (×3): 500 mg via ORAL
  Filled 2017-10-16 (×3): qty 1

## 2017-10-16 MED ORDER — INSULIN ASPART 100 UNIT/ML ~~LOC~~ SOLN
0.0000 [IU] | Freq: Three times a day (TID) | SUBCUTANEOUS | Status: DC
  Administered 2017-10-17: 9 [IU] via SUBCUTANEOUS
  Administered 2017-10-17: 5 [IU] via SUBCUTANEOUS
  Administered 2017-10-17: 2 [IU] via SUBCUTANEOUS
  Administered 2017-10-18: 9 [IU] via SUBCUTANEOUS
  Administered 2017-10-18 – 2017-10-19 (×3): 2 [IU] via SUBCUTANEOUS
  Administered 2017-10-19: 9 [IU] via SUBCUTANEOUS
  Administered 2017-10-20 (×2): 2 [IU] via SUBCUTANEOUS
  Administered 2017-10-21: 3 [IU] via SUBCUTANEOUS
  Administered 2017-10-21 – 2017-10-22 (×3): 5 [IU] via SUBCUTANEOUS
  Administered 2017-10-22: 1 [IU] via SUBCUTANEOUS
  Administered 2017-10-22: 3 [IU] via SUBCUTANEOUS

## 2017-10-16 MED ORDER — PREDNISONE 20 MG PO TABS: 40.0000 mg | ORAL_TABLET | Freq: Every day | ORAL | Status: DC

## 2017-10-16 MED ORDER — INSULIN ASPART PROT & ASPART (70-30 MIX) 100 UNIT/ML ~~LOC~~ SUSP
30.0000 [IU] | Freq: Two times a day (BID) | SUBCUTANEOUS | Status: DC
  Administered 2017-10-17: 30 [IU] via SUBCUTANEOUS

## 2017-10-16 NOTE — Progress Notes (Signed)
Inpatient Diabetes Program Recommendations  AACE/ADA: New Consensus Statement on Inpatient Glycemic Control (2015)  Target Ranges:  Prepandial:   less than 140 mg/dL      Peak postprandial:   less than 180 mg/dL (1-2 hours)      Critically ill patients:  140 - 180 mg/dL   Lab Results  Component Value Date   GLUCAP 174 (H) 10/16/2017   HGBA1C 8.4 (H) 10/15/2017   Review of Glycemic Control  DM 2 Outpatient Diabetes medications: 70/30 60 units BID, Lantus 48 units tid+ Jardiance 25 mg Daily + Janumet 50-1gm bid Current orders for Inpatient glycemic control:   Inpatient Diabetes Program Recommendations:   Decadron 10 mg given yesterday  Patient to be NPO after midnight, 70/30 is not recommended in patient's who will be NPO. Based on previous admission information and glucose trends consider:  -Weight based Lantus 20 units Q24 hours for now (was on 50 BID last admission) -Novolog Sensitive Correction 0-9 units tid -Novolog HS scale 0-5 units -When eating Novolog 3-5 units tid meal coverage if consuming at least 50% of meals.  Will convert patient back to 70/30 when eating.  Thanks,  Tama Headings RN, MSN, [facility] Inpatient Diabetes Coordinator Team Pager [phone] (8a-5p)

## 2017-10-16 NOTE — Progress Notes (Addendum)
Progress Note  Patient Name: Denise Cuevas Date of Encounter: 10/16/2017  Primary Cardiologist: [name]  Subjective   Chronic cough, chest pain at times with cough.    Inpatient Medications    Scheduled Meds: . aspirin  81 mg Oral Daily  . atorvastatin  40 mg Oral q1800  . DULoxetine  90 mg Oral Q lunch  . furosemide  40 mg Oral Daily  . insulin aspart  0-9 Units Subcutaneous TID WC  . insulin aspart protamine- aspart  30 Units Subcutaneous BID AC  . [START ON 10/17/2017] levofloxacin  500 mg Oral Daily  . levothyroxine  50 mcg Oral QAC breakfast  . mometasone-formoterol  2 puff Inhalation BID  . pantoprazole  80 mg Oral Daily  . [START ON 10/17/2017] predniSONE  40 mg Oral Q breakfast  . sodium chloride flush  3 mL Intravenous Q12H  . spironolactone  25 mg Oral Daily   Continuous Infusions: . sodium chloride     PRN Meds: sodium chloride, acetaminophen, albuterol, benzonatate, nitroGLYCERIN, ondansetron (ZOFRAN) IV   Vital Signs    Vitals:   10/16/17 0456 10/16/17 0751 10/16/17 0900 10/16/17 1142  BP: (!) 115/40  (!) 115/43 (!) 101/41  Pulse: 82  83 88  Resp: 16   18  Temp: 98.1 F (36.7 C)   98 F (36.7 C)  TempSrc: Oral   Oral  SpO2: 100% 99%  98%  Weight: 168 lb 12.8 oz (76.6 kg)     Height:        Intake/Output Summary (Last 24 hours) at 10/16/2017 1204 Last data filed at 10/16/2017 1142 Gross per 24 hour  Intake 168 ml  Output 1800 ml  Net -1632 ml   Filed Weights   10/15/17 1752 10/16/17 0456  Weight: 170 lb 3.1 oz (77.2 kg) 168 lb 12.8 oz (76.6 kg)    Telemetry    Sinus rhythm- Personally Reviewed  ECG    Sinus rhythm- Personally Reviewed  Physical Exam   GEN: No acute distress.   Neck: No JVD Cardiac: RRR, 3/6 systolic murmur heard best at right upper sternal border, rubs, or gallops.  Respiratory:  Poor air movement and constriction/wheezes bilaterally. GI: Soft, nontender, non-distended  MS: No edema; No deformity. Neuro:   Nonfocal  Psych: Normal affect   Labs    Chemistry Recent Labs  Lab 10/15/17 1259 10/15/17 1843 10/16/17 0428  NA 137 137 140  K 3.2* 3.1* 3.7  CL 103 104 106  CO2 29 26 26   GLUCOSE 360* 162* 283*  BUN 12 10 15   CREATININE 1.07* 0.96 1.13*  CALCIUM 7.9* 7.9* 8.1*  PROT  --  5.0* 4.7*  ALBUMIN  --  2.4* 2.1*  AST  --  71* 62*  ALT  --  39 33  ALKPHOS  --  146* 119  BILITOT  --  2.3* 2.4*  GFRNONAA 54* >60 51*  GFRAA >60 >60 59*  ANIONGAP 5 7 8      Hematology Recent Labs  Lab 10/15/17 1259 10/15/17 1843 10/16/17 0428  WBC 4.6 2.9* 4.6  RBC 4.09 4.06 3.64*  HGB 9.0* 9.0* 8.1*  HCT 30.6* 30.0* 27.0*  MCV 74.8* 73.9* 74.2*  MCH 22.0* 22.2* 22.3*  MCHC 29.4* 30.0 30.0  RDW 18.4* 18.8* 18.5*  PLT 89* 81* 79*    Cardiac Enzymes Recent Labs  Lab 10/15/17 1843 10/15/17 2318 10/16/17 0428  TROPONINI <0.03 <0.03 <0.03    Recent Labs  Lab 10/15/17 1313  TROPIPOC 0.00  BNP Recent Labs  Lab 10/15/17 1304 10/15/17 1843  BNP 125.3* 146.1*     DDimer No results for input(s): DDIMER in the last 168 hours.   Radiology    Dg Chest 2 View  Result Date: 10/15/2017 CLINICAL DATA:  Chest pain, shortness of breath and cough for 4 weeks. EXAM: CHEST  2 VIEW COMPARISON:  10/12/2017 and prior radiographs FINDINGS: Cardiomegaly and CABG changes again noted. There is no evidence of focal airspace disease, pulmonary edema, suspicious pulmonary nodule/mass, pleural effusion, or pneumothorax. No acute bony abnormalities are identified. IMPRESSION: Cardiomegaly without evidence of acute cardiopulmonary disease. Electronically Signed   By: Margarette Canada M.D.   On: 10/15/2017 13:58    Cardiac Studies   Echocardiogram pending  Patient Profile     67 y.o. female with known coronary artery disease status post CABG with subsequent cardiac catheterization with chronic anemia, chronic bronchitis, persistent cough, chest discomfort, mild aortic stenosis  Assessment & Plan      Chest pain  -Her cough is quite persistent and harsh.  Certainly hit her chest discomfort could be coming from musculoskeletal discomfort as well as inflammatory component of cough.  Thankfully, troponins are normal.  We will repeat an echocardiogram to ensure proper structure and function of her heart and to reevaluate her murmur.  Previously described as mild aortic stenosis.  -If after her bronchitis is resolved she is still having exertional chest discomfort, we certainly can have low threshold to pursue cardiac catheterization however I would like to hold off at this time given her other medical condition which can cause chest discomfort.  I do not think that her coronary disease is necessarily playing a role in a chronic cough situation.  Acute bronchitis/cough  -Currently on airway precautions.  Interestingly she states that her cough worsened after visiting her beach condo which had mold.  Anemia  -Hemoglobin is quite low, fairly chronic.  Consider further evaluation.  For questions or updates, please contact [place] Please consult [email or web address] for contact info under Cardiology/STEMI.      Signed, Candee Furbish, MD  10/16/2017, 12:04 PM

## 2017-10-16 NOTE — Progress Notes (Signed)
  Echocardiogram 2D Echocardiogram has been performed.  Denise Cuevas Denise Cuevas 10/16/2017, 12:59 PM

## 2017-10-16 NOTE — Progress Notes (Signed)
Family Medicine Teaching Service Daily Progress Note Intern Pager: [phone]  Patient name: Denise Cuevas Medical record number: [id number] Date of birth: 04/26/1951 Age: 66 y.o. Gender: female  Primary Care Provider: [name], MD Consultants: Cardiology Code Status: DNR/DNI (confirmed on admission)  Assessment and Plan: Denise Cuevas is a 66 y.o. female presenting with 1 month of chest pain and cough. PMH is significant for CAD with CABG in 2012 and stents placed thereafter, hypothyroidism, cirrhosis (presumed NAFLD), HLD, anxiety, aortic stenosis, T2DM and COPD?  Chest pain, resolved:  Questionable history of her intermittent chest pain for the past month. She is unable to reliably characterize her pain. However, has known CAD with extensive cardiac history. Patient notes increased stressors may be contributing to symptoms. Chest pain free currently.Troponin negative x3. EKG without ST elevations/depressions. CXR without signs of fluid overload. Last ECHO 11/20/16 showed EF of 60-65% and mild aortic stenosis. Myoview stress test 05/2015 intermediate with old infarction and no ischemia.  - Cardiology following; echo finished and read pending - Troponin negative x3 - Nitro as needed  - Continue asa 81 mg, lipitor 40 mg - Tylenol prn - Continue to hold home metoprolol 12.5 mg BID for hypotension - Magnesium pending   Productive Cough, improving: Patient unsure of diagnosis of COPD vs asthma but takes symbicort and albuterol at home. Just completed course of azithromycin without improvement of recent productive cough and increasing SOB. Diffuse wheezing on admission, however lungs clear today with improvement in cough frequency. Saturating well on room air. No increase work of breathing. CXR without consolidations. Will continue to presumptively treat for COPD exacerbation with levaquin given cardiac history ("complicated" COPD).  - s/p decadron in ED yesthjiberday; will start prednisone  40mg  in the am x3 days - Continue levaquin 500 mg daily x 5 days to treat possible COPD exacerbation - Dulera BID  - Albuterol as needed  - Tessalon perles as needed  - Influenza A and B negative, d/c precautions   Erythema of lower extremities, improving: Recurrent per patient and previously treated with hydrocortisone cream ("which did nothing"). Suspect peripheral vascular disease changes, as extremities are cool. However, palpable DP, PT pulses bilaterally. No leukocytosis or fever to suggest cellulitis. Patient's lower legs are not tender and have no increased warmth. LEs symmetric in size. ABIs normal 01/07/17.  - s/p IV lasix 40 mg given in ED due to pitting edema - s/p ceftriaxone given in ED for concern for cellulitis; will not continue - Continue home PO lasix  T2DM: A1c 8.4 10/15/17. On humalog mix 75/25 60 U before breakfast and dinner, jardiance 25 mg daily.  - glucose ranging from upper 100-300's, likely elevated from recent steroid administration  - sensitive SSI - CBGs qACHS - decrease humalog mix to 30 U BID as appetite down; increase as needed  AKI: Mild AKI with baseline SCr ~ 0.7/0.8.  -Cr 1.13 this am, likely secondary to dehydration after received IV lasix in ED with dry mucous membranes. Should improve now that PO intake as resumed.  -Continue home po lasix 40 for hypotension and AKI. -Continue to monitor   Hypothyroidism: TSH WNL at 3.084 11/19/16. - Continue home synthroid 50 mcg - TSH 2.66, wnl   Anemia/thrombcytopenia: Hgb 8.1 this am, 9.0 on admission. BL closer to 11. Denies hemoptysis or dark stools. Low iron at 13 in 2016. WBC 4.6. Platelets 79, likely sequela of liver cirrhosis.  - Continue to monitor - Iron studies pending   Anxiety: - Continue home  cymbalta 90 mg  - Ativan 0.5mg  BID as needed   Cirrhosis: Suspected NAFLD per medical record. Thrombocytopenia near baseline. Has been seen by [facility] in the past. [place] screening negative;  splenomegaly noted on [place] 10/09/17. - Continue home lasix 40 mg and spironolactone 25 mg daily  FEN/GI: NPO currently, restart carb modified/heart healthy diet afterwards ; protonix Prophylaxis: SCDs (thrombocytopenia)  Disposition: Home pending cardiology work-up; Believe patient would be stable for further workup at this time   Subjective:  Denise Cuevas is doing well this morning, endorses improvement in coughing frequency and lower extremity erythema. Currently chest pain free, states she had a few minute episode of left chest pressure earlier that went away without intervention. She is feeling anxious, as she is concerned she needs to be home to take care of her family.   Objective: Temp:  [98 F (36.7 C)-99.6 F (37.6 C)] 98.1 F (36.7 C) (11/08 0456) Pulse Rate:  [63-86] 82 (11/08 0456) Resp:  [10-21] 16 (11/08 0456) BP: (90-127)/(35-64) 115/40 (11/08 0456) SpO2:  [95 %-100 %] 100 % (11/08 0456) Weight:  [168 lb 12.8 oz (76.6 kg)-174 lb 1.9 oz (79 kg)] 168 lb 12.8 oz (76.6 kg) (11/08 0456) Physical Exam: General: Older, pleasant female in NAD Cardiovascular: RRR, S1, S2, 3/6 SEM heard loudest over right intercostal space with radiation into carotids; easily palpable 2+ DP, PT pulses.  Respiratory: CTA, occasional cough not productive at this time.  Gastrointestinal: +BS, soft, non-tender, non-distended.  MSK: 1+ pitting edema to pre-tibial shins bilaterally.  Derm: Cool extremities, minimal erythema across anterior shins without increased warmth or tenderness  Psych: Occasionally anxious, normal affect    Laboratory: Recent Labs  Lab 10/15/17 1259 10/15/17 1843 10/16/17 0428  WBC 4.6 2.9* 4.6  HGB 9.0* 9.0* 8.1*  HCT 30.6* 30.0* 27.0*  PLT 89* 81* 79*   Recent Labs  Lab 10/15/17 1259 10/15/17 1843 10/16/17 0428  NA 137 137 140  K 3.2* 3.1* 3.7  CL 103 104 106  CO2 29 26 26   BUN 12 10 15   CREATININE 1.07* 0.96 1.13*  CALCIUM 7.9* 7.9* 8.1*  PROT  --  5.0* 4.7*   BILITOT  --  2.3* 2.4*  ALKPHOS  --  146* 119  ALT  --  39 33  AST  --  71* 62*  GLUCOSE 360* 162* 283*    Imaging/Diagnostic Tests: Dg Chest 2 View  Result Date: 10/15/2017 CLINICAL DATA:  Chest pain, shortness of breath and cough for 4 weeks. EXAM: CHEST  2 VIEW COMPARISON:  10/12/2017 and prior radiographs FINDINGS: Cardiomegaly and CABG changes again noted. There is no evidence of focal airspace disease, pulmonary edema, suspicious pulmonary nodule/mass, pleural effusion, or pneumothorax. No acute bony abnormalities are identified. IMPRESSION: Cardiomegaly without evidence of acute cardiopulmonary disease. Electronically Signed   By: Margarette Canada M.D.   On: 10/15/2017 13:58    Nickola Major, Medical Student 10/16/2017, 7:17 AM [place] Intern pager: [phone], text pages welcome   I examined the patient with the medical student and I agree with their assessment and plan and have added my edits as necessary.   Aven Christen L. Rosalyn Gess, [place] [facility] Resident PGY-2 10/16/2017 2:49 PM

## 2017-10-16 NOTE — Progress Notes (Signed)
Patient c/o of pain in her chest but when I went in an assessed patient she stated that her pain had decreased to a 1/10. She then stated it had moved over to her right lower stomach. Md notified. The patient is currently having an echo completed. No action needed at this time. Will continue to monitor. BP 104/77 Hr 88

## 2017-10-17 DIAGNOSIS — E7800 Pure hypercholesterolemia, unspecified: Secondary | ICD-10-CM | POA: Diagnosis present

## 2017-10-17 DIAGNOSIS — R609 Edema, unspecified: Secondary | ICD-10-CM | POA: Diagnosis not present

## 2017-10-17 DIAGNOSIS — I080 Rheumatic disorders of both mitral and aortic valves: Secondary | ICD-10-CM | POA: Diagnosis present

## 2017-10-17 DIAGNOSIS — J42 Unspecified chronic bronchitis: Secondary | ICD-10-CM | POA: Diagnosis not present

## 2017-10-17 DIAGNOSIS — E039 Hypothyroidism, unspecified: Secondary | ICD-10-CM | POA: Diagnosis present

## 2017-10-17 DIAGNOSIS — E872 Acidosis: Secondary | ICD-10-CM | POA: Diagnosis present

## 2017-10-17 DIAGNOSIS — J209 Acute bronchitis, unspecified: Secondary | ICD-10-CM | POA: Diagnosis present

## 2017-10-17 DIAGNOSIS — E785 Hyperlipidemia, unspecified: Secondary | ICD-10-CM | POA: Diagnosis present

## 2017-10-17 DIAGNOSIS — I110 Hypertensive heart disease with heart failure: Secondary | ICD-10-CM | POA: Diagnosis present

## 2017-10-17 DIAGNOSIS — R0602 Shortness of breath: Secondary | ICD-10-CM | POA: Diagnosis present

## 2017-10-17 DIAGNOSIS — I200 Unstable angina: Secondary | ICD-10-CM | POA: Diagnosis not present

## 2017-10-17 DIAGNOSIS — I050 Rheumatic mitral stenosis: Secondary | ICD-10-CM | POA: Diagnosis not present

## 2017-10-17 DIAGNOSIS — N179 Acute kidney failure, unspecified: Secondary | ICD-10-CM | POA: Diagnosis present

## 2017-10-17 DIAGNOSIS — K219 Gastro-esophageal reflux disease without esophagitis: Secondary | ICD-10-CM | POA: Diagnosis present

## 2017-10-17 DIAGNOSIS — K7460 Unspecified cirrhosis of liver: Secondary | ICD-10-CM | POA: Diagnosis present

## 2017-10-17 DIAGNOSIS — D509 Iron deficiency anemia, unspecified: Secondary | ICD-10-CM | POA: Diagnosis present

## 2017-10-17 DIAGNOSIS — E119 Type 2 diabetes mellitus without complications: Secondary | ICD-10-CM | POA: Diagnosis present

## 2017-10-17 DIAGNOSIS — I38 Endocarditis, valve unspecified: Secondary | ICD-10-CM | POA: Diagnosis not present

## 2017-10-17 DIAGNOSIS — R011 Cardiac murmur, unspecified: Secondary | ICD-10-CM | POA: Diagnosis present

## 2017-10-17 DIAGNOSIS — R0789 Other chest pain: Secondary | ICD-10-CM | POA: Diagnosis present

## 2017-10-17 DIAGNOSIS — J440 Chronic obstructive pulmonary disease with acute lower respiratory infection: Secondary | ICD-10-CM | POA: Diagnosis present

## 2017-10-17 DIAGNOSIS — E43 Unspecified severe protein-calorie malnutrition: Secondary | ICD-10-CM | POA: Diagnosis not present

## 2017-10-17 DIAGNOSIS — I5033 Acute on chronic diastolic (congestive) heart failure: Secondary | ICD-10-CM | POA: Diagnosis present

## 2017-10-17 DIAGNOSIS — D696 Thrombocytopenia, unspecified: Secondary | ICD-10-CM | POA: Diagnosis present

## 2017-10-17 DIAGNOSIS — J45991 Cough variant asthma: Secondary | ICD-10-CM | POA: Diagnosis not present

## 2017-10-17 DIAGNOSIS — I25700 Atherosclerosis of coronary artery bypass graft(s), unspecified, with unstable angina pectoris: Secondary | ICD-10-CM | POA: Diagnosis present

## 2017-10-17 DIAGNOSIS — I959 Hypotension, unspecified: Secondary | ICD-10-CM | POA: Diagnosis present

## 2017-10-17 DIAGNOSIS — L03119 Cellulitis of unspecified part of limb: Secondary | ICD-10-CM | POA: Diagnosis not present

## 2017-10-17 DIAGNOSIS — R161 Splenomegaly, not elsewhere classified: Secondary | ICD-10-CM | POA: Diagnosis present

## 2017-10-17 DIAGNOSIS — I342 Nonrheumatic mitral (valve) stenosis: Secondary | ICD-10-CM | POA: Diagnosis not present

## 2017-10-17 DIAGNOSIS — I25110 Atherosclerotic heart disease of native coronary artery with unstable angina pectoris: Secondary | ICD-10-CM | POA: Diagnosis present

## 2017-10-17 DIAGNOSIS — D61818 Other pancytopenia: Secondary | ICD-10-CM | POA: Diagnosis present

## 2017-10-17 DIAGNOSIS — J441 Chronic obstructive pulmonary disease with (acute) exacerbation: Secondary | ICD-10-CM | POA: Diagnosis present

## 2017-10-17 DIAGNOSIS — I350 Nonrheumatic aortic (valve) stenosis: Secondary | ICD-10-CM | POA: Diagnosis not present

## 2017-10-17 DIAGNOSIS — I878 Other specified disorders of veins: Secondary | ICD-10-CM | POA: Diagnosis present

## 2017-10-17 DIAGNOSIS — I5031 Acute diastolic (congestive) heart failure: Secondary | ICD-10-CM | POA: Diagnosis not present

## 2017-10-17 DIAGNOSIS — Z01810 Encounter for preprocedural cardiovascular examination: Secondary | ICD-10-CM | POA: Diagnosis not present

## 2017-10-17 DIAGNOSIS — Z66 Do not resuscitate: Secondary | ICD-10-CM | POA: Diagnosis present

## 2017-10-17 LAB — COMPREHENSIVE METABOLIC PANEL
ALK PHOS: 116 U/L (ref 38–126)
ALT: 34 U/L (ref 14–54)
AST: 58 U/L — ABNORMAL HIGH (ref 15–41)
Albumin: 2.2 g/dL — ABNORMAL LOW (ref 3.5–5.0)
Anion gap: 8 (ref 5–15)
BUN: 15 mg/dL (ref 6–20)
CALCIUM: 8.4 mg/dL — AB (ref 8.9–10.3)
CHLORIDE: 105 mmol/L (ref 101–111)
CO2: 25 mmol/L (ref 22–32)
CREATININE: 1.01 mg/dL — AB (ref 0.44–1.00)
GFR, EST NON AFRICAN AMERICAN: 58 mL/min — AB (ref >60)
Glucose, Bld: 195 mg/dL — ABNORMAL HIGH (ref 65–99)
Potassium: 3.7 mmol/L (ref 3.5–5.1)
Sodium: 138 mmol/L (ref 135–145)
Total Bilirubin: 2.2 mg/dL — ABNORMAL HIGH (ref 0.3–1.2)
Total Protein: 4.9 g/dL — ABNORMAL LOW (ref 6.5–8.1)

## 2017-10-17 LAB — IRON AND TIBC
Iron: 20 ug/dL — ABNORMAL LOW (ref 28–170)
Saturation Ratios: 7 % — ABNORMAL LOW (ref 10.4–31.8)
TIBC: 302 ug/dL (ref 250–450)
UIBC: 282 ug/dL

## 2017-10-17 LAB — GLUCOSE, CAPILLARY
GLUCOSE-CAPILLARY: 174 mg/dL — AB (ref 65–99)
GLUCOSE-CAPILLARY: 255 mg/dL — AB (ref 65–99)
Glucose-Capillary: 363 mg/dL — ABNORMAL HIGH (ref 65–99)
Glucose-Capillary: 341 mg/dL — ABNORMAL HIGH (ref 65–99)

## 2017-10-17 LAB — CBC
HEMATOCRIT: 28.6 % — AB (ref 36.0–46.0)
HEMOGLOBIN: 8.5 g/dL — AB (ref 12.0–15.0)
MCH: 22.1 pg — AB (ref 26.0–34.0)
MCHC: 29.7 g/dL — ABNORMAL LOW (ref 30.0–36.0)
MCV: 74.5 fL — AB (ref 78.0–100.0)
Platelets: 76 10*3/uL — ABNORMAL LOW (ref 150–400)
RBC: 3.84 MIL/uL — AB (ref 3.87–5.11)
RDW: 18.8 % — ABNORMAL HIGH (ref 11.5–15.5)
WBC: 5 10*3/uL (ref 4.0–10.5)

## 2017-10-17 LAB — FERRITIN: Ferritin: 17 ng/mL (ref 11–307)

## 2017-10-17 MED ORDER — FERROUS SULFATE 325 (65 FE) MG PO TABS: 325.0000 mg | ORAL_TABLET | Freq: Every day | ORAL | Status: DC

## 2017-10-17 MED ORDER — FUROSEMIDE 10 MG/ML IJ SOLN: 20.0000 mg | Freq: Once | INTRAMUSCULAR | Status: DC

## 2017-10-17 MED ORDER — ACETAMINOPHEN-CODEINE #3 300-30 MG PO TABS
1.0000 | ORAL_TABLET | Freq: Four times a day (QID) | ORAL | Status: DC | PRN
  Administered 2017-10-17 – 2017-10-20 (×2): 1 via ORAL
  Filled 2017-10-17 (×2): qty 1

## 2017-10-17 MED ORDER — POTASSIUM CHLORIDE CRYS ER 20 MEQ PO TBCR
40.0000 meq | EXTENDED_RELEASE_TABLET | Freq: Once | ORAL | Status: AC
  Administered 2017-10-17: 40 meq via ORAL
  Filled 2017-10-17: qty 2

## 2017-10-17 MED ORDER — GUAIFENESIN-DM 100-10 MG/5ML PO SYRP
5.0000 mL | ORAL_SOLUTION | ORAL | Status: DC | PRN
  Administered 2017-10-17: 5 mL via ORAL
  Filled 2017-10-17: qty 5

## 2017-10-17 MED ORDER — INSULIN ASPART PROT & ASPART (70-30 MIX) 100 UNIT/ML ~~LOC~~ SUSP
40.0000 [IU] | Freq: Two times a day (BID) | SUBCUTANEOUS | Status: DC
  Administered 2017-10-17 – 2017-10-22 (×9): 40 [IU] via SUBCUTANEOUS

## 2017-10-17 MED ORDER — LEVOTHYROXINE SODIUM 50 MCG PO TABS
50.0000 ug | ORAL_TABLET | Freq: Every day | ORAL | Status: DC
  Administered 2017-10-17 – 2017-10-22 (×5): 50 ug via ORAL
  Filled 2017-10-17 (×6): qty 1

## 2017-10-17 MED ORDER — FUROSEMIDE 10 MG/ML IJ SOLN
40.0000 mg | Freq: Two times a day (BID) | INTRAMUSCULAR | Status: AC
Start: 2017-10-17 — End: 2017-10-17
  Administered 2017-10-17 (×2): 40 mg via INTRAVENOUS
  Filled 2017-10-17 (×2): qty 4

## 2017-10-17 MED ORDER — ACETAMINOPHEN-CODEINE 120-12 MG/5ML PO SOLN: 5.0000 mL | Freq: Four times a day (QID) | ORAL | Status: DC | PRN

## 2017-10-17 NOTE — Discharge Summary (Signed)
Denise Cuevas Hospital Discharge Summary  Patient name: Denise Cuevas Medical record number: [id number] Date of birth: [birth year] Age: 66 y.o. Gender: female Date of Admission: 10/15/2017  Date of Discharge:  Admitting Physician: Denise Feil, MD  Primary Care Provider: No primary care provider on file. Consultants: Cardiology   Indication for Hospitalization: Chest pain ACS r/o, Cough 2/2 bronchitis/COPD/HFpEF exacerbation  Discharge Diagnoses/Problem List:  CAD s/p CABG and stent placement in 2012  Type 2 diabetes mellitus  Moderate Aortic Stenosis Moderate-severe mitral stenosis Moderate mitral regurgitation   Preserved EF heart failure (EF 60-65%) ?COPD, Asthma  Liver Cirrhosis  Hypothyroidism  Anxiety Depression Chronic microcytic anemia  Hypoalbuminemia   Disposition: To home   Discharge Condition: Stable   Discharge Exam: General: Older, pleasant female in NAD  Cardiovascular: RRR, 3+ systolic murmur radiating into carotids, 2+ DP, PT pulses  Respiratory: CTAB, minor bibasilar crackles, no wheezing or rhonchi.  Extremities: Warm, dry. 1+ pitting edema from shins down. Psych: Normal mood and affect.   Brief Hospital Course:       Denise Cuevas is a 66 year old female with a past history significant for preserved EF heart failure, known CAD s/p CABG and stent placement in 4098, non-alcoholic liver cirrhosis, asthma, ?COPD, and type 2 diabetes mellitus who presented on 11/7 with a 1 month history of intermittent chest pain and cough. She was unable to reliably characterize her chest pain, with no predictive pattern and varying chest locations.           On arrival she was hemodynamically stable. Her blood pressures remained low ranging 11-914'N systolic and 82'N diastolic, however appears to be her general baseline as she is also chronically on spironolactone and lasix therapy for liver cirrhosis. EKG sinus rhythm without ST changes. Troponin negative  x3. Transesophageal echocardiogram showed EF 60-65%, moderate aortic stenosis (with severe calcifications), moderate-severe mitral stenosis, and moderate regurgitation; previously mild grade aortic stenosis and mitral stenosis on echo 11/2016. Cardiac cath on 11/12 showed no acute occlusions, with 1 out of 2 patent bypass vessels. Cardiology recommended continued medical therapy for CAD. Chest pain has subsequently resolved, and thought to be secondary to her recent aggressive coughing that has also subsided. Dr. Servando Cuevas, her previous cardiac surgeon, was consulted by Cardiology for consideration of valvular surgical intervention, and he will follow patient closely in the outpatient setting to discuss options. Pre-CABG [place] of carotids and upper extremities performed prior to discharge.          She was presumed to have a COPD exacerbation/bronchitis after failing outpatient treatment with azithromycin and was subsequently treated with Levaquin, steroids, and nebulizer therapy while hospitalized with improvement but continue cough. On 11/9, it was noted her cough was worse while laying down, and given her history preserved EF heart failure with LE edema, IV lasix 40mg  BID was initiated per cardiology. With this, her cough has subsided and she has been able to lay mostly flat without concern. Fluid balance -13L since admission.         Her remaining chronic conditions were well managed during her stay. She was started on ferrous sulfate on 11/9 for iron deficiency anemia. Her glucose has been elevated during her admission, however this was in the setting of receiving solumedrol in the ED and three day course of prednisone.         Erythema of bilateral tibias thought to be secondary to venous stasis rather than a cellulitis. Leg elevation and compression hose recommended.  Issues for Follow Up:  1. Follow Hgb/CBC, started ferrous sulfate 325mg  daily on 11/9. Hgb on discharge: 9.2 g/dl. Remained stable during  admission.  2. Started trial of compression stockings for likely venous stasis, follow up on effectiveness for her.  3. BPs with resuming home medications. Cardiology recommended increasing carvedilol if pressures could tolerate. Losartan was on her medication list, however stated she no longer took this. It was not given during admission. 4. She was restarted on her home spironolactone dose (25mg ) and lasix 40mg  daily at discharge.  5. Ensure that patient has follow-up appointments scheduled with Cardiology and CVTS.   Significant Procedures: Diagnostic heart catherization and TEE on 11/12   Significant Labs and Imaging:  Recent Labs  Lab 10/20/17 0956 10/21/17 0458 10/22/17 0516  WBC 6.5 5.7 6.0  HGB 9.4* 9.8* 9.2*  HCT 31.6* 33.1* 30.6*  PLT 99* 114* 89*   Recent Labs  Lab 10/17/17 0516 10/18/17 0601 10/19/17 0357 10/21/17 0458 10/22/17 0516  NA 138 134* 136 135 135  K 3.7 3.8 3.8 4.6 4.9  CL 105 102 103 103 106  CO2 25 22 26 26 26   GLUCOSE 195* 200* 166* 319* 195*  BUN 15 22* 22* 16 14  CREATININE 1.01* 1.17* 1.06* 0.96 0.93  CALCIUM 8.4* 8.7* 8.5* 8.6* 8.5*  ALKPHOS 116  --   --   --   --   AST 58*  --   --   --   --   ALT 34  --   --   --   --   ALBUMIN 2.2*  --   --   --   --     Results/Tests Pending at Time of Discharge: Pre-CABG [place] of carotids and UEs    Discharge Medications:  Allergies as of 10/22/2017      Reactions   Exenatide Nausea And Vomiting   Ace Inhibitors Other (See Comments)   pseudoasthma      Medication List    STOP taking these medications   hydrocortisone cream 1 %     TAKE these medications   albuterol (2.5 MG/3ML) 0.083% nebulizer solution Commonly known as:  PROVENTIL Take 3 mLs (2.5 mg total) by nebulization every 2 (two) hours as needed for wheezing.   albuterol 108 (90 Base) MCG/ACT inhaler Commonly known as:  PROVENTIL HFA;VENTOLIN HFA Inhale 2 puffs into the lungs every 6 (six) hours as needed for wheezing or  shortness of breath.   aspirin 81 MG tablet Take 81 mg by mouth daily.   atorvastatin 40 MG tablet Commonly known as:  LIPITOR Take 40 mg by mouth daily.   budesonide-formoterol 80-4.5 MCG/ACT inhaler Commonly known as:  SYMBICORT Inhale 2 puffs into the lungs 2 (two) times daily.   DULoxetine 30 MG capsule Commonly known as:  CYMBALTA Take 90 mg by mouth daily with lunch. In conjunction with one 60 mg capsule to equal a total of 90 milligrams   DULoxetine 60 MG capsule Commonly known as:  CYMBALTA Take 90 mg by mouth daily with lunch. In conjunction with one 30 mg capsule to equal a total of 90 milligrams   ferrous sulfate 325 (65 FE) MG tablet Take 1 tablet (325 mg total) daily with breakfast by mouth.   furosemide 40 MG tablet Commonly known as:  LASIX Take 1 tablet (40 mg total) by mouth daily.   HUMALOG MIX 75/25 KWIKPEN (75-25) 100 UNIT/ML Kwikpen Generic drug:  Insulin Lispro Prot & Lispro INJECT 60 UNITS SUBCUTANEOUSLY  BEFORE BREAKFAST AND 60 UNITS BEFORE EVENING MEAL   JARDIANCE 25 MG Tabs tablet Generic drug:  empagliflozin Take 25 mg daily by mouth.   levothyroxine 50 MCG tablet Commonly known as:  SYNTHROID, LEVOTHROID Take 50 mcg by mouth daily before breakfast.   metoprolol tartrate 25 MG tablet Commonly known as:  LOPRESSOR Take 12.5 mg by mouth 2 (two) times daily.   nitroGLYCERIN 0.4 MG SL tablet Commonly known as:  NITROSTAT Place 0.4 mg under the tongue every 5 (five) minutes as needed. For chest pain   omeprazole-sodium bicarbonate 40-1100 MG capsule Commonly known as:  ZEGERID Take 1 capsule by mouth daily before breakfast.   spironolactone 25 MG tablet Commonly known as:  ALDACTONE Take 1 tablet (25 mg total) by mouth daily.       Discharge Instructions: Please refer to Patient Instructions section of EMR for full details.  Patient was counseled important signs and symptoms that should prompt return to medical care, changes in  medications, dietary instructions, activity restrictions, and follow up appointments.   Follow-Up Appointments: Follow-up Information    Rogue Bussing, MD. Go on 10/29/2017.   Specialty:  Family Medicine Why:  Arrive at [facility] information: [place] [address] [phone]        [name], MD Follow up.   Specialty:  Cardiothoracic Surgery Contact information: [address] [place] [address] [phone]        [name], MD Follow up.   Specialty:  Cardiology Contact information: [address] [place] [address] [phone]           Note prepared by [place]. Reviewed and edited by me with necessary changes made above.  Rogue Bussing, MD 10/23/2017, 7:09 PM [place]

## 2017-10-17 NOTE — Progress Notes (Addendum)
Family Medicine Teaching Service Daily Progress Note Intern Pager: [phone]  Patient name: Denise Cuevas Medical record number: [id number] Date of birth: 1951-09-17 Age: 66 y.o. Gender: female  Primary Care Provider: No primary care provider on file. Consultants:Cardiology Code Status:DNR/DNI (confirmed on admission)  Assessment and Plan: Denise Cuevas a 66 y.o.femalepresenting with 1 month of chest pain and cough. PMH is significant forCAD with CABG in 2012 and stents placed thereafter, hypothyroidism, cirrhosis (presumed NAFLD), HLD, anxiety, aortic stenosis, T2DM and COPD?  Chest pain, improving: Questionable history of her intermittent chest pain for the past month. She is unable to reliably characterize her pain. However, has known CAD with extensive cardiac history. Patient notes increased stressors may be contributing to symptoms. Chest pain free currently, but had an episode of left chest pressure during echo yesterday. Reproducible to palpation. Troponin negative x3. EKG without ST elevations/depressions. CXR without signs of fluid overload. Echo on 11/8 showed EF of 60-65% with moderate aortic stenosis and mitral stenosis/regurg.   - Cardiology following; no acute intervention at this time, may consider diagnostic cardiac cath for her valvular concerns if no clinical improvement with lasix therapy as below  - Troponin negative x3 - Nitro as needed  - Continue asa 81 mg, lipitor 40 mg - Tylenol prn - Continue to hold home metoprolol 12.5 mg BID for hypotension  Cough: States this am that her cough is worse with laying down, didn't sleep well last night. Per cardiology, will initiate IV lasix 40mg  BID to see if this will improve orthopneic symptoms also given history of preserved EF heart failure and LE edema. Diffuse wheezing on admission with concern for COPD exacerbation, however lungs clear today. Saturating well on room air. No increase work of breathing. CXR without  consolidations. - s/p decadron in ED yesterday; will start prednisone 40mg  today x3 days - Continue levaquin 500 mg daily x 5 days (Day 3) to treat possible additional COPD exacerbation - Start IV lasix 40mg  BID per cardiology, monitor BP closely  - Dulera BID  - Albuterol as needed  - tylenol with codeine syrup as needed; robitussin-dm - Influenza A and B negative  Erythema of lower extremities, resolved:  Suspect venous stasis changes, given the erythema onsets with swelling and dissipates as it decreases. No leukocytosis or fever to suggest cellulitis. Patient's lower legs are not tender and have no increased warmth. LEs symmetric in size. ABIs normal 01/07/17.  -Elevate LE when possible -IV lasix 40mg  BID (goal to remove about 2L if kidney function allows) -Continue to monitor   T2DM: A1c 8.4 10/15/17. On humalog mix 75/25 60 U before breakfast and dinner, jardiance 25 mg daily.  - Glucose ranging from upper 100-300's, likely elevated from recent steroid administration  - sensitive SSI - CBGs qACHS - Increase humalog mix to 40u BID, first dose of 40 tonight  AKI, improving:Mild AKI with baseline SCr ~ 0.7/0.8.  -Cr 1.01 this am -BMP in am, continue to monitor given starting IV lasix today   Hypothyroidism: TSH WNL at 3.084 11/19/16. - Continue home synthroid 50 mcg - TSH 2.66, wnl   Anemia/thrombcytopenia:Hgb 8.1 this am, 9.0 on admission. BL closer to 11. Denies hemoptysis or dark stools. Low iron at 20, ferritin 17. Platelets 79, likely sequela of liver cirrhosis.  - Start ferrous sulfate 325mg  daily  - Continue to monitor  Anxiety: - Continue home cymbalta 90 mg  - Ativan 0.5mg  BID as needed   Cirrhosis: Suspected NAFLD per medical record. Thrombocytopenia near  baseline. Has been seen by [facility] in the past. [place] screening negative; splenomegaly noted on [place] 10/09/17. - Continue spironolactone 25 mg daily - Hold home lasix given IV therapy   FEN/GI:Carb  modified/heart healthy diet afterwards; protonix Prophylaxis:SCDs (thrombocytopenia)  Disposition:Home pending cardiology work-up   Subjective:  Doing well this morning, does complain of increased non-productive coughing overnight especially with laying down. 1 episode of CP yesterday during echo, states its reproducible with palpation and resolved without intervention. Chest pain free currently. Denies any dizziness, nausea, or abdominal pain.   Objective: Temp:  [98 F (36.7 C)-98.6 F (37 C)] 98 F (36.7 C) (11/09 0424) Pulse Rate:  [86-96] 96 (11/09 0746) Resp:  [17-18] 18 (11/09 0746) BP: (100-120)/(40-47) 120/40 (11/09 0424) SpO2:  [97 %-100 %] 97 % (11/09 0746) Weight:  [169 lb 1.6 oz (76.7 kg)] 169 lb 1.6 oz (76.7 kg) (11/09 0424) Physical Exam: General:Older, pleasant female in NAD Cardiovascular:RRR, S1, S2, 3/6 SEM heard loudest over right intercostal space with radiation into carotids; easily palpable 2+ DP, PT pulses. Respiratory: CTA, occasional cough not productive at this time. Tenderness to palpation of anterior axillary fold at the level of 2-3 ribs.   Gastrointestinal:+BS, soft, non-tender, non-distended.  MSK:1+ pitting edema to pre-tibial shins bilaterally.  Derm:No erythema across anterior shins. No increased warmth or tenderness.  Psych:Occasionally anxious, normal affect.    Laboratory: Recent Labs  Lab 10/15/17 1843 10/16/17 0428 10/17/17 0516  WBC 2.9* 4.6 5.0  HGB 9.0* 8.1* 8.5*  HCT 30.0* 27.0* 28.6*  PLT 81* 79* 76*   Recent Labs  Lab 10/15/17 1843 10/16/17 0428 10/17/17 0516  NA 137 140 138  K 3.1* 3.7 3.7  CL 104 106 105  CO2 26 26 25   BUN 10 15 15   CREATININE 0.96 1.13* 1.01*  CALCIUM 7.9* 8.1* 8.4*  PROT 5.0* 4.7* 4.9*  BILITOT 2.3* 2.4* 2.2*  ALKPHOS 146* 119 116  ALT 39 33 34  AST 71* 62* 58*  GLUCOSE 162* 283* 195*    Imaging/Diagnostic Tests:  Nickola Major, Medical Student 10/17/2017, 9:10 AM [place] Intern pager: [phone], text pages welcome  Patient seen and discussed with medical student. Agree with above plan; necessary changes made.  Olene Floss, MD [place], PGY-3

## 2017-10-17 NOTE — Progress Notes (Signed)
Progress Note  Patient Name: Denise Cuevas Date of Encounter: 10/17/2017  Primary Cardiologist: Denise Cuevas  Subjective   Coughing seems to be worse when laying flat.  Questionable orthopnea component.  Had a bout of atypical chest pain yesterday.  Troponins normal.  Inpatient Medications    Scheduled Meds: . aspirin  81 mg Oral Daily  . atorvastatin  40 mg Oral q1800  . DULoxetine  90 mg Oral Q lunch  . furosemide  40 mg Intravenous BID  . insulin aspart  0-9 Units Subcutaneous TID WC  . insulin aspart protamine- aspart  30 Units Subcutaneous BID AC  . levofloxacin  500 mg Oral Daily  . levothyroxine  50 mcg Oral QAC breakfast  . mometasone-formoterol  2 puff Inhalation BID  . pantoprazole  80 mg Oral Daily  . predniSONE  40 mg Oral Q breakfast  . sodium chloride flush  3 mL Intravenous Q12H  . spironolactone  25 mg Oral Daily   Continuous Infusions: . sodium chloride     PRN Meds: sodium chloride, acetaminophen, albuterol, guaiFENesin-dextromethorphan, nitroGLYCERIN, ondansetron (ZOFRAN) IV   Vital Signs    Vitals:   10/16/17 2023 10/17/17 0030 10/17/17 0424 10/17/17 0746  BP:  (!) 120/47 (!) 120/40   Pulse:   89 96  Resp:  18 18 18   Temp:  98.6 F (37 C) 98 F (36.7 C)   TempSrc:  Oral Oral   SpO2: 99% 100% 100% 97%  Weight:   169 lb 1.6 oz (76.7 kg)   Height:        Intake/Output Summary (Last 24 hours) at 10/17/2017 0900 Last data filed at 10/17/2017 0037 Gross per 24 hour  Intake 480 ml  Output 2870 ml  Net -2390 ml   Filed Weights   10/15/17 1752 10/16/17 0456 10/17/17 0424  Weight: 170 lb 3.1 oz (77.2 kg) 168 lb 12.8 oz (76.6 kg) 169 lb 1.6 oz (76.7 kg)    Telemetry    Sinus rhythm- Personally Reviewed  ECG    Sinus rhythm- Personally Reviewed  Physical Exam   GEN: Well nourished, well developed, in no acute distress  HEENT: normal  Neck: no JVD, carotid bruits, or masses Cardiac: RRR; 3/6 SM (AS) murmur,no rubs, or gallops, 2+ LE edema   Respiratory: Improved work of breathing today, improved air movement GI: soft, nontender, nondistended, + BS MS: no deformity or atrophy  Skin: warm and dry, no rash Neuro:  Alert and Oriented x 3, Strength and sensation are intact Psych: euthymic mood, full affect   Labs    Chemistry Recent Labs  Lab 10/15/17 1843 10/16/17 0428 10/17/17 0516  NA 137 140 138  K 3.1* 3.7 3.7  CL 104 106 105  CO2 26 26 25   GLUCOSE 162* 283* 195*  BUN 10 15 15   CREATININE 0.96 1.13* 1.01*  CALCIUM 7.9* 8.1* 8.4*  PROT 5.0* 4.7* 4.9*  ALBUMIN 2.4* 2.1* 2.2*  AST 71* 62* 58*  ALT 39 33 34  ALKPHOS 146* 119 116  BILITOT 2.3* 2.4* 2.2*  GFRNONAA >60 51* 58*  GFRAA >60 59* >60  ANIONGAP 7 8 8      Hematology Recent Labs  Lab 10/15/17 1843 10/16/17 0428 10/17/17 0516  WBC 2.9* 4.6 5.0  RBC 4.06 3.64* 3.84*  HGB 9.0* 8.1* 8.5*  HCT 30.0* 27.0* 28.6*  MCV 73.9* 74.2* 74.5*  MCH 22.2* 22.3* 22.1*  MCHC 30.0 30.0 29.7*  RDW 18.8* 18.5* 18.8*  PLT 81* 79* 76*  Cardiac Enzymes Recent Labs  Lab 10/15/17 1843 10/15/17 2318 10/16/17 0428  TROPONINI <0.03 <0.03 <0.03    Recent Labs  Lab 10/15/17 1313  TROPIPOC 0.00     BNP Recent Labs  Lab 10/15/17 1304 10/15/17 1843  BNP 125.3* 146.1*     DDimer No results for input(s): DDIMER in the last 168 hours.   Radiology    Dg Chest 2 View  Result Date: 10/15/2017 CLINICAL DATA:  Chest pain, shortness of breath and cough for 4 weeks. EXAM: CHEST  2 VIEW COMPARISON:  10/12/2017 and prior radiographs FINDINGS: Cardiomegaly and CABG changes again noted. There is no evidence of focal airspace disease, pulmonary edema, suspicious pulmonary nodule/mass, pleural effusion, or pneumothorax. No acute bony abnormalities are identified. IMPRESSION: Cardiomegaly without evidence of acute cardiopulmonary disease. Electronically Signed   By: Margarette Canada M.D.   On: 10/15/2017 13:58    Cardiac Studies   Echocardiogram demonstrated normal  ejection fraction, moderate aortic stenosis, possibly moderate mitral stenosis.   Patient Profile     66 y.o. female with known coronary artery disease status post CABG with subsequent cardiac catheterization with chronic anemia, chronic bronchitis, persistent cough, chest discomfort, now moderate aortic stenosis/mitral stenosis  Assessment & Plan    Chest pain  -Her cough is quite persistent and harsh.  I still think that most of her chest pain history sounds atypical and likely associated with cough.  Thankfully, troponins are normal.  Echocardiogram reveals normal EF, no significant wall motion abnormality. -Given her history of orthopnea, cough that worsens with laying down, I would like to pursue aggressive diuresis with 2 L out per day.  We will change her Lasix from 40 mg p.o. daily to 40 mg IV twice daily.  Watch creatinine.  She does demonstrate lower extremity edema, midneck JVD.  I think that her moderate aortic stenosis and possibly a degree of mitral stenosis may be playing a role as well.  If after diuresis we are still struggling to help her feel better, we will likely pursue right and left heart catheterization to characterize her aortic stenosis, possible mitral stenosis, check her pulmonary pressures, wedge pressure, left ventricular end-diastolic pressure to more accurately assess her fluid status and to check her for any evidence of worsening coronary artery disease.  At this time, because of her cough, she cannot lay flat comfortably and I would not feel comfortable placing her on the catheterization table in this state.  Discussed with residents.  Acute bronchitis/cough  -Seems to be mildly improved.  Cough worse with laying down.  Worse with deep breath.  Post steroids.  Anemia  -Hemoglobin is quite low, fairly chronic.  Consider further evaluation.  Ferritin 17.  For questions or updates, please contact [place] Please consult [email or web address] for contact info under  Cardiology/STEMI.      Signed, Denise Furbish, MD  10/17/2017, 9:00 AM

## 2017-10-18 ENCOUNTER — Encounter (HOSPITAL_COMMUNITY): Payer: Self-pay | Admitting: Nurse Practitioner

## 2017-10-18 DIAGNOSIS — I5031 Acute diastolic (congestive) heart failure: Secondary | ICD-10-CM

## 2017-10-18 LAB — CBC
HEMATOCRIT: 31.5 % — AB (ref 36.0–46.0)
HEMOGLOBIN: 9.3 g/dL — AB (ref 12.0–15.0)
MCH: 21.8 pg — ABNORMAL LOW (ref 26.0–34.0)
MCHC: 29.5 g/dL — ABNORMAL LOW (ref 30.0–36.0)
MCV: 73.9 fL — ABNORMAL LOW (ref 78.0–100.0)
Platelets: 119 10*3/uL — ABNORMAL LOW (ref 150–400)
RBC: 4.26 MIL/uL (ref 3.87–5.11)
RDW: 18.8 % — ABNORMAL HIGH (ref 11.5–15.5)
WBC: 11.3 10*3/uL — AB (ref 4.0–10.5)

## 2017-10-18 LAB — GLUCOSE, CAPILLARY
GLUCOSE-CAPILLARY: 180 mg/dL — AB (ref 65–99)
GLUCOSE-CAPILLARY: 288 mg/dL — AB (ref 65–99)
Glucose-Capillary: 175 mg/dL — ABNORMAL HIGH (ref 65–99)
Glucose-Capillary: 375 mg/dL — ABNORMAL HIGH (ref 65–99)

## 2017-10-18 LAB — LIPID PANEL
Cholesterol: 121 mg/dL (ref 0–200)
HDL: 33 mg/dL — ABNORMAL LOW (ref >40)
LDL CALC: 76 mg/dL (ref 0–99)
Total CHOL/HDL Ratio: 3.7 RATIO
Triglycerides: 61 mg/dL (ref <150)
VLDL: 12 mg/dL (ref 0–40)

## 2017-10-18 LAB — BASIC METABOLIC PANEL
ANION GAP: 10 (ref 5–15)
BUN: 22 mg/dL — ABNORMAL HIGH (ref 6–20)
CALCIUM: 8.7 mg/dL — AB (ref 8.9–10.3)
CHLORIDE: 102 mmol/L (ref 101–111)
CO2: 22 mmol/L (ref 22–32)
Creatinine, Ser: 1.17 mg/dL — ABNORMAL HIGH (ref 0.44–1.00)
GFR calc non Af Amer: 49 mL/min — ABNORMAL LOW (ref >60)
GFR, EST AFRICAN AMERICAN: 56 mL/min — AB (ref >60)
Glucose, Bld: 200 mg/dL — ABNORMAL HIGH (ref 65–99)
Potassium: 3.8 mmol/L (ref 3.5–5.1)
SODIUM: 134 mmol/L — AB (ref 135–145)

## 2017-10-18 MED ORDER — SPIRONOLACTONE 25 MG PO TABS
12.5000 mg | ORAL_TABLET | Freq: Every day | ORAL | Status: DC
  Administered 2017-10-19 – 2017-10-22 (×4): 12.5 mg via ORAL
  Filled 2017-10-18 (×4): qty 1

## 2017-10-18 MED ORDER — FUROSEMIDE 10 MG/ML IJ SOLN
40.0000 mg | Freq: Four times a day (QID) | INTRAMUSCULAR | Status: AC
  Administered 2017-10-18 (×2): 40 mg via INTRAVENOUS
  Filled 2017-10-18 (×2): qty 4

## 2017-10-18 MED ORDER — FERROUS SULFATE 325 (65 FE) MG PO TABS
325.0000 mg | ORAL_TABLET | Freq: Every day | ORAL | Status: DC
  Administered 2017-10-18 – 2017-10-22 (×4): 325 mg via ORAL
  Filled 2017-10-18 (×5): qty 1

## 2017-10-18 NOTE — Progress Notes (Signed)
Progress Note  Patient Name: Denise Cuevas Date of Encounter: 10/18/2017   Subjective   SOB and orthopnea improving but not resolved.   Inpatient Medications    Scheduled Meds: . aspirin  81 mg Oral Daily  . atorvastatin  40 mg Oral q1800  . DULoxetine  90 mg Oral Q lunch  . ferrous sulfate  325 mg Oral Q breakfast  . insulin aspart  0-9 Units Subcutaneous TID WC  . insulin aspart protamine- aspart  40 Units Subcutaneous BID AC  . levofloxacin  500 mg Oral Daily  . levothyroxine  50 mcg Oral QAC breakfast  . mometasone-formoterol  2 puff Inhalation BID  . pantoprazole  80 mg Oral Daily  . predniSONE  40 mg Oral Q breakfast  . sodium chloride flush  3 mL Intravenous Q12H  . [START ON 10/19/2017] spironolactone  12.5 mg Oral Daily   Continuous Infusions: . sodium chloride     PRN Meds: sodium chloride, acetaminophen, acetaminophen-codeine, albuterol, nitroGLYCERIN, ondansetron (ZOFRAN) IV   Vital Signs    Vitals:   10/17/17 2015 10/18/17 0627 10/18/17 0801 10/18/17 0819  BP: (!) 110/36 (!) 115/48    Pulse: 90 84 88   Resp: 18 18 16    Temp: 98.7 F (37.1 C) (!) 97.4 F (36.3 C)    TempSrc: Oral Oral    SpO2: 100% 100% 98% 98%  Weight:  166 lb 11.2 oz (75.6 kg)    Height:        Intake/Output Summary (Last 24 hours) at 10/18/2017 1130 Last data filed at 10/18/2017 0900 Gross per 24 hour  Intake 720 ml  Output 2600 ml  Net -1880 ml   Filed Weights   10/16/17 0456 10/17/17 0424 10/18/17 0627  Weight: 168 lb 12.8 oz (76.6 kg) 169 lb 1.6 oz (76.7 kg) 166 lb 11.2 oz (75.6 kg)    Telemetry    SR  ECG  na  Physical Exam   GEN: No acute distress.   Neck:+ JVD Cardiac: RRR, 3/6 systolic murmur RUSB Respiratory: Clear to auscultation bilaterally. GI: Soft, nontender, non-distended  MS: 1+ bilateral LE edema; No deformity. Neuro:  Nonfocal  Psych: Normal affect   Labs    Chemistry Recent Labs  Lab 10/15/17 1843 10/16/17 0428 10/17/17 0516  10/18/17 0601  NA 137 140 138 134*  K 3.1* 3.7 3.7 3.8  CL 104 106 105 102  CO2 26 26 25 22   GLUCOSE 162* 283* 195* 200*  BUN 10 15 15  22*  CREATININE 0.96 1.13* 1.01* 1.17*  CALCIUM 7.9* 8.1* 8.4* 8.7*  PROT 5.0* 4.7* 4.9*  --   ALBUMIN 2.4* 2.1* 2.2*  --   AST 71* 62* 58*  --   ALT 39 33 34  --   ALKPHOS 146* 119 116  --   BILITOT 2.3* 2.4* 2.2*  --   GFRNONAA >60 51* 58* 49*  GFRAA >60 59* >60 56*  ANIONGAP 7 8 8 10      Hematology Recent Labs  Lab 10/16/17 0428 10/17/17 0516 10/18/17 0601  WBC 4.6 5.0 11.3*  RBC 3.64* 3.84* 4.26  HGB 8.1* 8.5* 9.3*  HCT 27.0* 28.6* 31.5*  MCV 74.2* 74.5* 73.9*  MCH 22.3* 22.1* 21.8*  MCHC 30.0 29.7* 29.5*  RDW 18.5* 18.8* 18.8*  PLT 79* 76* 119*    Cardiac Enzymes Recent Labs  Lab 10/15/17 1843 10/15/17 2318 10/16/17 0428  TROPONINI <0.03 <0.03 <0.03    Recent Labs  Lab 10/15/17 1313  TROPIPOC  0.00     BNP Recent Labs  Lab 10/15/17 1304 10/15/17 1843  BNP 125.3* 146.1*     DDimer No results for input(s): DDIMER in the last 168 hours.   Radiology    No results found.  Cardiac Studies     Patient Profile      Assessment & Plan    1. Acute diastolic HF/Valvular heart disease - patient with echo LVEF 60-65%, elevated LA pressure. Diastolic function is affected by mitral stenosis. Moderate AS with AVA VTI 1.05, mean grad 30. MV mean gradient planimetered area 1.5 and by PHT 1.49, mean gradient 15. Unknown heart rate at time of echo. Rheumatic like pattern - working to [employer]. Negative 2.3 liters yesterday, negative 5.3 liters since admission. Mild uptrend in Cr.  - possible TEE inpatient vs outpatient pending clinical course. Her MV gradient is quite severe though calculated areas are in the moderate range.   - remains fluid overloaded, continue IV diuresis today and likely tomorrow. Consider possible TEE Monday vs outpatient study for further workup of valvular heart disease.      For questions or  updates, please contact [place] Please consult [email or web address] for contact info under Cardiology/STEMI.      Merrily Pew, MD  10/18/2017, 11:30 AM

## 2017-10-18 NOTE — Progress Notes (Signed)
Family Medicine Teaching Service Daily Progress Note Intern Pager: [phone]  Patient name: Denise Cuevas Medical record number: [id number] Date of birth: 10/15/1951 Age: 66 y.o. Gender: female  Primary Care Provider: No primary care provider on file. Consultants:Cardiology Code Status:DNR/DNI (confirmed on admission)  Assessment and Plan: SAHARA FUJIMOTO a 66 y.o.femalepresenting with 1 month of chest pain and cough. PMH is significant forCAD with CABG in 2012 and stents placed thereafter, hypothyroidism, cirrhosis (presumed NAFLD), HLD, anxiety, aortic stenosis, T2DM and COPD?  Chest pain: Improving.ACS has been ruled out. Pain likely MSK related from coughing - Vitals per floor protocol - Continue asa 81 mg, lipitor 40 mg - Tylenol prn for pain - Continue to hold home metoprolol 12.5 mg BID for hypotension - Cardiology consulted due to significant PMH and valvular dysfunction seen on most recent echo, appreciate their assistance  Cough: Likely multifactorial with COPD exacerbation and some aspect of possible fluid overload with orthopnea and leg edema. Echo on 11/8 showed EF of 60-65% with moderate aortic stenosis and mitral stenosis/regurg. S/p Lasix 40mg  BID on 11/9 with 2.9L UO. Lungs clear to auscultation and no supplemental O2 needed.  -Prednisone 40mg  today x3 days (Day 2) -Levaquin 500 mg daily x 5 days (Day 4)  -Hold off on further Lasix for now -Dulera BID  -Albuterol as needed  -Tylenol with codeine syrup as needed; robitussin-dm  Hypotension: Continues to have low normal systolicin low 761'Y and low diastolics into 07-37'T. Asymptomatic.  - Hold home Metoprolol, lasix, cozaar - Decrease Spironolactone to 12.5 mg instead of 25 mg daily, starting tomorrow - Holding diuretics at this time - Continue to monitor closely  Erythema of lower extremities: Resolved.  Suspect venous stasis changes, given the erythema onsets with swelling and dissipates as it  decreases. No leukocytosis or fever to suggest cellulitis. Patient's lower legs are not tender and have no increased warmth. LEs symmetric in size. ABIs normal 01/07/17.  -Elevate LE when possible -Continue to monitor   T2DM: A1c 8.4 10/15/17. On humalog mix 75/25 60 U before breakfast and dinner, jardiance 25 mg daily. Glucose ranges widely from 300's to high 100's.18 U of SSI given over last 24 hours - sensitive SSI - CBGs qACHS - Humalog mix 40u BID  AKI:Cr 1.17. baseline SCr ~ 0.7/0.8. likely from lasix -Attempt to avoid nephrotoxic agents if possible  Hypothyroidism: TSH 2.66, wnl  - Continue home synthroid 50 mcg  Iron deficiency anemia/thrombocytopenia:Improving; hgb of 9.3 and plt 119 today. Denies hemoptysis or dark stools. Low iron at 20, ferritin 17. Platelets 79, likely sequela of liver cirrhosis.  - Ferrous sulfate 325mg  daily  - Continue to monitor daily CBC  Anxiety: - Continue home cymbalta 90 mg  - Ativan 0.5mg  BID as needed   Cirrhosis: Suspected NAFLD per medical record. Thrombocytopenia near baseline. Has been seen by [facility] in the past. [place] screening negative; splenomegaly noted on [place] 10/09/17. On spironolactone 25 mg daily at home - Decreased Spironolactone to 12.5 mg as above for hypotension - Can likely resume home Lasix on DC  FEN/GI:Carb modified/heart healthy diet afterwards; protonix Prophylaxis:SCDs (thrombocytopenia)  Disposition:Home pending cardiology work-up  Subjective:  Patient states she is feeling better this AM. Cough and chest pain both improved. Tolerating PO.   Objective: Temp:  [97.4 F (36.3 C)-98.7 F (37.1 C)] 97.4 F (36.3 C) (11/10 0627) Pulse Rate:  [84-91] 88 (11/10 0801) Resp:  [16-18] 16 (11/10 0801) BP: (108-118)/(35-48) 115/48 (11/10 0627) SpO2:  [98 %-100 %]  98 % (11/10 0819) Weight:  [166 lb 11.2 oz (75.6 kg)] 166 lb 11.2 oz (75.6 kg) (11/10 8850) Physical Exam: General:Older, pleasant female in  NAD Cardiovascular:RRR, S1, S2, 2-7/7 systolic murmur,  2+ DP, PT pulses. Respiratory: CTAB, occasional cough during deep inspiration.  Gastrointestinal:+BS, soft, non-tender, non-distended.  MSK:2+ pitting edema from shins down and trace edema in thighs.  Neuro: hand tremor Derm:No erythema across anterior shins. No increased warmth or tenderness.  Psych:Normal mood and affect   Laboratory: Recent Labs  Lab 10/16/17 0428 10/17/17 0516 10/18/17 0601  WBC 4.6 5.0 11.3*  HGB 8.1* 8.5* 9.3*  HCT 27.0* 28.6* 31.5*  PLT 79* 76* 119*   Recent Labs  Lab 10/15/17 1843 10/16/17 0428 10/17/17 0516 10/18/17 0601  NA 137 140 138 134*  K 3.1* 3.7 3.7 3.8  CL 104 106 105 102  CO2 26 26 25 22   BUN 10 15 15  22*  CREATININE 0.96 1.13* 1.01* 1.17*  CALCIUM 7.9* 8.1* 8.4* 8.7*  PROT 5.0* 4.7* 4.9*  --   BILITOT 2.3* 2.4* 2.2*  --   ALKPHOS 146* 119 116  --   ALT 39 33 34  --   AST 71* 62* 58*  --   GLUCOSE 162* 283* 195* 200*    Imaging/Diagnostic Tests:  Carlyle Dolly, MD 10/18/2017, 8:59 AM [place] Intern pager: [phone], text pages welcome

## 2017-10-18 NOTE — Progress Notes (Signed)
Pt stable during 7am to 7pm shift, no any complications and complain of chest pain noted, pt was out of bed most of the time sitting in the recliner, diuresing well, coughing moderate, will continue to monitor

## 2017-10-19 ENCOUNTER — Encounter (HOSPITAL_COMMUNITY): Payer: Self-pay | Admitting: Nurse Practitioner

## 2017-10-19 DIAGNOSIS — I38 Endocarditis, valve unspecified: Secondary | ICD-10-CM

## 2017-10-19 DIAGNOSIS — J42 Unspecified chronic bronchitis: Secondary | ICD-10-CM

## 2017-10-19 LAB — GLUCOSE, CAPILLARY
GLUCOSE-CAPILLARY: 151 mg/dL — AB (ref 65–99)
GLUCOSE-CAPILLARY: 394 mg/dL — AB (ref 65–99)
Glucose-Capillary: 106 mg/dL — ABNORMAL HIGH (ref 65–99)
Glucose-Capillary: 226 mg/dL — ABNORMAL HIGH (ref 65–99)

## 2017-10-19 LAB — BASIC METABOLIC PANEL
ANION GAP: 7 (ref 5–15)
BUN: 22 mg/dL — ABNORMAL HIGH (ref 6–20)
CO2: 26 mmol/L (ref 22–32)
Calcium: 8.5 mg/dL — ABNORMAL LOW (ref 8.9–10.3)
Chloride: 103 mmol/L (ref 101–111)
Creatinine, Ser: 1.06 mg/dL — ABNORMAL HIGH (ref 0.44–1.00)
GFR, EST NON AFRICAN AMERICAN: 55 mL/min — AB (ref >60)
GLUCOSE: 166 mg/dL — AB (ref 65–99)
POTASSIUM: 3.8 mmol/L (ref 3.5–5.1)
Sodium: 136 mmol/L (ref 135–145)

## 2017-10-19 LAB — CBC
HEMATOCRIT: 29.9 % — AB (ref 36.0–46.0)
Hemoglobin: 8.9 g/dL — ABNORMAL LOW (ref 12.0–15.0)
MCH: 22 pg — ABNORMAL LOW (ref 26.0–34.0)
MCHC: 29.8 g/dL — ABNORMAL LOW (ref 30.0–36.0)
MCV: 73.8 fL — AB (ref 78.0–100.0)
PLATELETS: 99 10*3/uL — AB (ref 150–400)
RBC: 4.05 MIL/uL (ref 3.87–5.11)
RDW: 18.6 % — ABNORMAL HIGH (ref 11.5–15.5)
WBC: 7.6 10*3/uL (ref 4.0–10.5)

## 2017-10-19 MED ORDER — POLYETHYLENE GLYCOL 3350 17 G PO PACK
17.0000 g | PACK | Freq: Every day | ORAL | Status: DC
  Administered 2017-10-19: 17 g via ORAL
  Filled 2017-10-19 (×4): qty 1

## 2017-10-19 NOTE — Progress Notes (Signed)
Progress Note  Patient Name: Denise Cuevas Date of Encounter: 10/19/2017   Subjective  SOB and orthopnea improving, not resolved  Inpatient Medications    Scheduled Meds: . aspirin  81 mg Oral Daily  . atorvastatin  40 mg Oral q1800  . DULoxetine  90 mg Oral Q lunch  . ferrous sulfate  325 mg Oral Q breakfast  . insulin aspart  0-9 Units Subcutaneous TID WC  . insulin aspart protamine- aspart  40 Units Subcutaneous BID AC  . levothyroxine  50 mcg Oral QAC breakfast  . mometasone-formoterol  2 puff Inhalation BID  . pantoprazole  80 mg Oral Daily  . polyethylene glycol  17 g Oral Daily  . sodium chloride flush  3 mL Intravenous Q12H  . spironolactone  12.5 mg Oral Daily   Continuous Infusions: . sodium chloride     PRN Meds: sodium chloride, acetaminophen, acetaminophen-codeine, albuterol, nitroGLYCERIN, ondansetron (ZOFRAN) IV   Vital Signs    Vitals:   10/18/17 1942 10/18/17 2204 10/19/17 0633 10/19/17 0735  BP:  (!) 118/43 (!) 103/37 (!) 100/59  Pulse:  94 85   Resp:  18 18   Temp:  98.3 F (36.8 C) 98.1 F (36.7 C)   TempSrc:  Oral Oral   SpO2: 99% 99% 100%   Weight:   164 lb (74.4 kg)   Height:        Intake/Output Summary (Last 24 hours) at 10/19/2017 0957 Last data filed at 10/19/2017 0900 Gross per 24 hour  Intake 840 ml  Output 3800 ml  Net -2960 ml   Filed Weights   10/17/17 0424 10/18/17 0627 10/19/17 0633  Weight: 169 lb 1.6 oz (76.7 kg) 166 lb 11.2 oz (75.6 kg) 164 lb (74.4 kg)    Telemetry    SR  ECG  na  Physical Exam   GEN: No acute distress.   Neck: elevated JVD Cardiac: RRR, 3/6 systolic murmur rusb Respiratory: Clear to auscultation bilaterally. GI: Soft, nontender, non-distended  MS: No edema; No deformity. Neuro:  Nonfocal  Psych: Normal affect   Labs    Chemistry Recent Labs  Lab 10/15/17 1843 10/16/17 0428 10/17/17 0516 10/18/17 0601 10/19/17 0357  NA 137 140 138 134* 136  K 3.1* 3.7 3.7 3.8 3.8  CL  104 106 105 102 103  CO2 26 26 25 22 26   GLUCOSE 162* 283* 195* 200* 166*  BUN 10 15 15  22* 22*  CREATININE 0.96 1.13* 1.01* 1.17* 1.06*  CALCIUM 7.9* 8.1* 8.4* 8.7* 8.5*  PROT 5.0* 4.7* 4.9*  --   --   ALBUMIN 2.4* 2.1* 2.2*  --   --   AST 71* 62* 58*  --   --   ALT 39 33 34  --   --   ALKPHOS 146* 119 116  --   --   BILITOT 2.3* 2.4* 2.2*  --   --   GFRNONAA >60 51* 58* 49* 55*  GFRAA >60 59* >60 56* >60  ANIONGAP 7 8 8 10 7      Hematology Recent Labs  Lab 10/17/17 0516 10/18/17 0601 10/19/17 0357  WBC 5.0 11.3* 7.6  RBC 3.84* 4.26 4.05  HGB 8.5* 9.3* 8.9*  HCT 28.6* 31.5* 29.9*  MCV 74.5* 73.9* 73.8*  MCH 22.1* 21.8* 22.0*  MCHC 29.7* 29.5* 29.8*  RDW 18.8* 18.8* 18.6*  PLT 76* 119* 99*    Cardiac Enzymes Recent Labs  Lab 10/15/17 1843 10/15/17 2318 10/16/17 0428  TROPONINI <0.03 <0.03 <  0.03    Recent Labs  Lab 10/15/17 1313  TROPIPOC 0.00     BNP Recent Labs  Lab 10/15/17 1304 10/15/17 1843  BNP 125.3* 146.1*     DDimer No results for input(s): DDIMER in the last 168 hours.   Radiology    No results found.  Cardiac Studies    Patient Profile     66 y.o. female admitted with acute diastolic HF complicated by significant aortic and mitral stenosis.   Assessment & Plan    1. Acute diastolic HF/Valvular heart disease - patient with echo LVEF 60-65%, elevated LA pressure. Diastolic function is affected by mitral stenosis. Moderate AS with AVA VTI 1.05, mean grad 30. MV mean gradient planimetered area 1.5 and by PHT 1.49, mean gradient 15. Heart rate during study was around 90 bpm. Rheumatic like pattern - working to [employer]. Negative 2.3 liters yesterday, negative 7.7 liters since admission. Mild uptrend in Cr in general but down since yesterday.   -remains fluid overloaded and symptomatic. Plan would be for TEE once more euvolemic to better evaluate the MV anatomy and hemodynamics. Pending results may require further workup including RHC/LHC  and consideration for valve intervention. Will make NPO at midnight incase symptoms improve for testing  Very low diastolic bp's at times appear inaccurate, manual bp today 105/55.      For questions or updates, please contact [place] Please consult [email or web address] for contact info under Cardiology/STEMI.      Merrily Pew, MD  10/19/2017, 9:57 AM

## 2017-10-19 NOTE — Progress Notes (Signed)
FMTS ATTENDING ADMISSION NOTE Denise Fulfer,MD I  have seen and examined this patient, reviewed their chart. I have discussed this patient with the resident. I agree with the resident's findings, assessment and care plan.    Patient seen and evaluated by me today. Seems to be doing better. She endorsed worsened leg swelling early this morning but has since resolved.  She is stable from bronchitis stand point. Cards to f/u.  See resident's note below.   Patient name: Denise Cuevas           Medical record number: [id number] Date of birth: 1951/10/23      Age: 66 y.o.    Gender: female  Primary Care Provider: No primary care provider on file. Consultants:Cardiology Code Status:DNR/DNI (confirmed on admission)  Assessment and Plan: Denise Cuevas a 66 y.o.femalepresenting with 1 month of chest pain and cough. PMH is significant forCAD with CABG in 2012 and stents placed thereafter, hypothyroidism, cirrhosis (presumed NAFLD), HLD, anxiety, aortic stenosis, T2DM and COPD?  Cough/Fluid overload: Cough improving. Likely multifactorial with COPD exacerbation/bronchitis and some aspect of CHF exacerbation. Echo on 11/8 showed EF of 60-65% with moderate aortic stenosis and mitral stenosis/regurg. 3.1L UO on IV Lasix. Down 2 lbs from yesterday. Lungs clear to auscultation and no supplemental O2 needed.  -Prednisone 40mg  todayx3 days (Day 3 today, will DC steroids) -Levaquin 500 mg daily x 5 days (Day 5 today, will stop abx)  -Cardiology consulted, appreciate recommendations -Continue IV Lasix 40 mg q 6 - Strict I/O  - Daily weight -Dulera BID  -Albuterolas needed -Tylenol with codeine syrup as needed; robitussin-dm  Chest pain: Resolved.ACS has been ruled out. Pain likely MSK related from coughing -Vitals per floor protocol -Continue asa 81 mg, lipitor 40 mg -Tylenol prn for pain   Hypotension: Continues to have low normal systolicin low 035'D and low diastolics  into 97-41'U. Asymptomatic.  - Hold home Metoprolol, lasix, cozaar - Spironolactone to 12.5 mg instead of 25 mg daily - Lasix as above, will need to be cautious - Continue to monitor closely  Erythema of lower extremities: Resolved.   -Elevate LE when possible -Continue to monitor   T2DM: A1c 8.4 10/15/17.On humalog mix 75/25 60 U before breakfast and dinner, jardiance 25 mg daily. Glucose ranges widely from 300's to low 100's.13 U of SSI given over last 24 hours - sensitive SSI - CBGs qACHS - Humalog mix 40u BID  AKI:Cr 1.06.baselineSCr ~ 0.7/0.8.likely from lasix -Attempt to avoid nephrotoxic agents if possible  Hypothyroidism: TSH 2.66, wnl - Continue home synthroid 50 mcg  Iron deficiency anemia/thrombocytopenia:stable; hgb of 9.3>8.9 and plt 119>99 today. Denies hemoptysis or dark stools. Low iron at 20, ferritin 17. Likely sequela of liver cirrhosis. - Ferrous sulfate 325mg  daily  - Continue to monitor daily CBC - Miralax to avoid constipation with iron supplementation  Anxiety: - Continue home cymbalta 90 mg - Ativan 0.5mg  BID as needed  Cirrhosis: Suspected NAFLD per medical record. Thrombocytopenia near baseline. Has been seen by [facility] in the past. [place] screening negative; splenomegaly noted on [place] 10/09/17. On spironolactone 25 mg daily at home - Spironolactone to 12.5 mg as above for hypotension - Can likely resume home Lasix on DC  FEN/GI:Carb modified/heart healthy dietafterwards; protonix Prophylaxis:SCDs (thrombocytopenia)  Disposition:Home pending improvement in cough and fluid overload  Subjective:  Patient states she feels better today. No chest pain. Cough improving.   Objective: Temp:  [98.1 F (36.7 C)-98.5 F (36.9 C)] 98.1 F (36.7 C) (  11/11 0633) Pulse Rate:  [85-94] 85 (11/11 0633) Resp:  [18] 18 (11/11 0633) BP: (100-118)/(37-59) 100/59 (11/11 0735) SpO2:  [99 %-100 %] 100 % (11/11 0633) Weight:  [164 lb (74.4 kg)]  164 lb (74.4 kg) (11/11 2446) Physical Exam: General:Older, pleasant female in NAD Cardiovascular:RRR, S1, K8,6-3/8 systolic murmur, 2+DP, PT pulses. Respiratory:CTAB, occasional cough during deep inspiration.  Gastrointestinal:+BS, soft,non-tender, non-distended. MSK:2+ pitting edema from shins down and trace edema in thighs. Neuro: hand tremor Derm:Noerythema across anterior shins. No increased warmth or tenderness. Psych:Normal mood and affect   Laboratory: LastLabs  Recent Labs  Lab 10/17/17 0516 10/18/17 0601 10/19/17 0357  WBC 5.0 11.3* 7.6  HGB 8.5* 9.3* 8.9*  HCT 28.6* 31.5* 29.9*  PLT 76* 119* 99*     LastLabs  Recent Labs  Lab 10/15/17 1843 10/16/17 0428 10/17/17 0516 10/18/17 0601 10/19/17 0357  NA 137 140 138 134* 136  K 3.1* 3.7 3.7 3.8 3.8  CL 104 106 105 102 103  CO2 26 26 25 22 26   BUN 10 15 15  22* 22*  CREATININE 0.96 1.13* 1.01* 1.17* 1.06*  CALCIUM 7.9* 8.1* 8.4* 8.7* 8.5*  PROT 5.0* 4.7* 4.9*  --   --   BILITOT 2.3* 2.4* 2.2*  --   --   ALKPHOS 146* 119 116  --   --   ALT 39 33 34  --   --   AST 71* 62* 58*  --   --   GLUCOSE 162* 283* 195* 200* 166*      Imaging/Diagnostic Tests:  Carlyle Dolly, MD 10/19/2017, 8:38 AM [place] Intern pager: [phone], text pages welcome

## 2017-10-19 NOTE — Progress Notes (Signed)
Pt stable throughout the shift, ambulating in a hallway, no nay complain of chest pain, SOB and distress, will be in NPO for procedure tomorrow (TEE), no any orders so far except NPO, will continue to monitor the patient

## 2017-10-19 NOTE — Progress Notes (Signed)
Family Medicine Teaching Service Daily Progress Note Intern Pager: [phone]  Patient name: Denise Cuevas Medical record number: [id number] Date of birth: 09-02-1951 Age: 66 y.o. Gender: female  Primary Care Provider: No primary care provider on file. Consultants:Cardiology Code Status:DNR/DNI (confirmed on admission)  Assessment and Plan: Denise Cuevas a 66 y.o.femalepresenting with 1 month of chest pain and cough. PMH is significant forCAD with CABG in 2012 and stents placed thereafter, hypothyroidism, cirrhosis (presumed NAFLD), HLD, anxiety, aortic stenosis, T2DM and COPD?  Cough/Fluid overload: Cough improving. Likely multifactorial with COPD exacerbation/bronchitis and some aspect of CHF exacerbation. Echo on 11/8 showed EF of 60-65% with moderate aortic stenosis and mitral stenosis/regurg. 3.1L UO on IV Lasix. Down 2 lbs from yesterday. Lungs clear to auscultation and no supplemental O2 needed.  -Prednisone 40mg  today x3 days (Day 3 today, will DC steroids) -Levaquin 500 mg daily x 5 days (Day 5 today, will stop abx)  -Cardiology consulted, appreciate recommendations -Continue IV Lasix 40 mg q 6 - Strict I/O  - Daily weight -Dulera BID  -Albuterol as needed  -Tylenol with codeine syrup as needed; robitussin-dm  Chest pain: Resolved.ACS has been ruled out. Pain likely MSK related from coughing - Vitals per floor protocol - Continue asa 81 mg, lipitor 40 mg - Tylenol prn for pain   Hypotension: Continues to have low normal systolicin low 154'M and low diastolics into 08-67'Y. Asymptomatic.  - Hold home Metoprolol, lasix, cozaar - Spironolactone to 12.5 mg instead of 25 mg daily - Lasix as above, will need to be cautious - Continue to monitor closely  Erythema of lower extremities: Resolved.   -Elevate LE when possible -Continue to monitor   T2DM: A1c 8.4 10/15/17. On humalog mix 75/25 60 U before breakfast and dinner, jardiance 25 mg daily. Glucose ranges  widely from 300's to low 100's.13 U of SSI given over last 24 hours - sensitive SSI - CBGs qACHS - Humalog mix 40u BID  AKI:Cr 1.06. baseline SCr ~ 0.7/0.8. likely from lasix -Attempt to avoid nephrotoxic agents if possible  Hypothyroidism: TSH 2.66, wnl  - Continue home synthroid 50 mcg  Iron deficiency anemia/thrombocytopenia:stable; hgb of 9.3>8.9 and plt 119>99 today. Denies hemoptysis or dark stools. Low iron at 20, ferritin 17. Likely sequela of liver cirrhosis.  - Ferrous sulfate 325mg  daily  - Continue to monitor daily CBC - Miralax to avoid constipation with iron supplementation  Anxiety: - Continue home cymbalta 90 mg  - Ativan 0.5mg  BID as needed   Cirrhosis: Suspected NAFLD per medical record. Thrombocytopenia near baseline. Has been seen by [facility] in the past. [place] screening negative; splenomegaly noted on [place] 10/09/17. On spironolactone 25 mg daily at home - Spironolactone to 12.5 mg as above for hypotension - Can likely resume home Lasix on DC  FEN/GI:Carb modified/heart healthy diet afterwards; protonix Prophylaxis:SCDs (thrombocytopenia)  Disposition:Home pending improvement in cough and fluid overload  Subjective:  Patient states she feels better today. No chest pain. Cough improving.   Objective: Temp:  [98.1 F (36.7 C)-98.5 F (36.9 C)] 98.1 F (36.7 C) (11/11 1950) Pulse Rate:  [85-94] 85 (11/11 0633) Resp:  [18] 18 (11/11 0633) BP: (100-118)/(37-59) 100/59 (11/11 0735) SpO2:  [99 %-100 %] 100 % (11/11 0633) Weight:  [164 lb (74.4 kg)] 164 lb (74.4 kg) (11/11 9326) Physical Exam: General:Older, pleasant female in NAD Cardiovascular:RRR, S1, S2, 7-1/2 systolic murmur,  2+ DP, PT pulses. Respiratory: CTAB, occasional cough during deep inspiration.  Gastrointestinal:+BS, soft, non-tender, non-distended.  MSK:2+ pitting edema from shins down and trace edema in thighs.  Neuro: hand tremor Derm:No erythema across anterior shins. No  increased warmth or tenderness.  Psych:Normal mood and affect   Laboratory: Recent Labs  Lab 10/17/17 0516 10/18/17 0601 10/19/17 0357  WBC 5.0 11.3* 7.6  HGB 8.5* 9.3* 8.9*  HCT 28.6* 31.5* 29.9*  PLT 76* 119* 99*   Recent Labs  Lab 10/15/17 1843 10/16/17 0428 10/17/17 0516 10/18/17 0601 10/19/17 0357  NA 137 140 138 134* 136  K 3.1* 3.7 3.7 3.8 3.8  CL 104 106 105 102 103  CO2 26 26 25 22 26   BUN 10 15 15  22* 22*  CREATININE 0.96 1.13* 1.01* 1.17* 1.06*  CALCIUM 7.9* 8.1* 8.4* 8.7* 8.5*  PROT 5.0* 4.7* 4.9*  --   --   BILITOT 2.3* 2.4* 2.2*  --   --   ALKPHOS 146* 119 116  --   --   ALT 39 33 34  --   --   AST 71* 62* 58*  --   --   GLUCOSE 162* 283* 195* 200* 166*    Imaging/Diagnostic Tests:  Carlyle Dolly, MD 10/19/2017, 8:38 AM [place] Intern pager: [phone], text pages welcome

## 2017-10-19 NOTE — Plan of Care (Signed)
  Clinical Measurements: Ability to maintain clinical measurements within normal limits will improve 10/19/2017 0324 - Progressing by Irish Lack, RN   Clinical Measurements: Respiratory complications will improve 10/19/2017 0324 - Progressing by Irish Lack, RN

## 2017-10-19 NOTE — Progress Notes (Signed)
Patient AAO x4. Refused bed alarm. Will continue to monitor patient.

## 2017-10-20 ENCOUNTER — Encounter (HOSPITAL_COMMUNITY)
Admission: EM | Disposition: A | Discharge status: Home / Self Care | Payer: Self-pay | Source: Home / Self Care | Attending: Family Medicine

## 2017-10-20 ENCOUNTER — Encounter (HOSPITAL_COMMUNITY): Payer: Self-pay | Admitting: General Practice

## 2017-10-20 ENCOUNTER — Inpatient Hospital Stay (HOSPITAL_COMMUNITY): Payer: Medicare Other

## 2017-10-20 DIAGNOSIS — K7460 Unspecified cirrhosis of liver: Secondary | ICD-10-CM

## 2017-10-20 DIAGNOSIS — L03119 Cellulitis of unspecified part of limb: Secondary | ICD-10-CM

## 2017-10-20 DIAGNOSIS — E43 Unspecified severe protein-calorie malnutrition: Secondary | ICD-10-CM

## 2017-10-20 DIAGNOSIS — R079 Chest pain, unspecified: Secondary | ICD-10-CM

## 2017-10-20 DIAGNOSIS — I050 Rheumatic mitral stenosis: Secondary | ICD-10-CM

## 2017-10-20 DIAGNOSIS — I350 Nonrheumatic aortic (valve) stenosis: Secondary | ICD-10-CM

## 2017-10-20 DIAGNOSIS — I342 Nonrheumatic mitral (valve) stenosis: Secondary | ICD-10-CM

## 2017-10-20 DIAGNOSIS — D61818 Other pancytopenia: Secondary | ICD-10-CM

## 2017-10-20 HISTORY — PX: TEE WITHOUT CARDIOVERSION: SHX5443

## 2017-10-20 HISTORY — PX: RIGHT/LEFT HEART CATH AND CORONARY/GRAFT ANGIOGRAPHY: CATH118267

## 2017-10-20 LAB — POCT I-STAT 3, VENOUS BLOOD GAS (G3P V)
BICARBONATE: 24.9 mmol/L (ref 20.0–28.0)
O2 Saturation: 79 %
PCO2 VEN: 38.5 mmHg — AB (ref 44.0–60.0)
PH VEN: 7.419 (ref 7.250–7.430)
PO2 VEN: 43 mmHg (ref 32.0–45.0)
TCO2: 26 mmol/L (ref 22–32)

## 2017-10-20 LAB — POCT I-STAT 3, ART BLOOD GAS (G3+)
Acid-base deficit: 1 mmol/L (ref 0.0–2.0)
BICARBONATE: 23.9 mmol/L (ref 20.0–28.0)
O2 Saturation: 96 %
PCO2 ART: 39.4 mmHg (ref 32.0–48.0)
PH ART: 7.391 (ref 7.350–7.450)
PO2 ART: 85 mmHg (ref 83.0–108.0)
TCO2: 25 mmol/L (ref 22–32)

## 2017-10-20 LAB — CBC
HEMATOCRIT: 31.6 % — AB (ref 36.0–46.0)
HEMOGLOBIN: 9.4 g/dL — AB (ref 12.0–15.0)
MCH: 22 pg — ABNORMAL LOW (ref 26.0–34.0)
MCHC: 29.7 g/dL — ABNORMAL LOW (ref 30.0–36.0)
MCV: 74 fL — ABNORMAL LOW (ref 78.0–100.0)
Platelets: 99 10*3/uL — ABNORMAL LOW (ref 150–400)
RBC: 4.27 MIL/uL (ref 3.87–5.11)
RDW: 18.7 % — ABNORMAL HIGH (ref 11.5–15.5)
WBC: 6.5 10*3/uL (ref 4.0–10.5)

## 2017-10-20 LAB — GLUCOSE, CAPILLARY
GLUCOSE-CAPILLARY: 152 mg/dL — AB (ref 65–99)
GLUCOSE-CAPILLARY: 157 mg/dL — AB (ref 65–99)
GLUCOSE-CAPILLARY: 275 mg/dL — AB (ref 65–99)
Glucose-Capillary: 159 mg/dL — ABNORMAL HIGH (ref 65–99)

## 2017-10-20 SURGERY — RIGHT/LEFT HEART CATH AND CORONARY/GRAFT ANGIOGRAPHY
Anesthesia: LOCAL

## 2017-10-20 SURGERY — ECHOCARDIOGRAM, TRANSESOPHAGEAL
Anesthesia: Moderate Sedation

## 2017-10-20 MED ORDER — ASPIRIN 81 MG PO CHEW: 81.0000 mg | CHEWABLE_TABLET | ORAL | Status: DC

## 2017-10-20 MED ORDER — SODIUM CHLORIDE 0.9 % IV SOLN: INTRAVENOUS | Status: DC

## 2017-10-20 MED ORDER — SODIUM CHLORIDE 0.9 % IV SOLN: 250.0000 mL | INTRAVENOUS | Status: DC | PRN

## 2017-10-20 MED ORDER — SODIUM CHLORIDE 0.9% FLUSH
3.0000 mL | Freq: Two times a day (BID) | INTRAVENOUS | Status: DC
  Administered 2017-10-21 – 2017-10-22 (×2): 3 mL via INTRAVENOUS

## 2017-10-20 MED ORDER — LIDOCAINE HCL (PF) 1 % IJ SOLN
INTRAMUSCULAR | Status: AC
  Filled 2017-10-20: qty 30

## 2017-10-20 MED ORDER — SODIUM CHLORIDE 0.9 % IV SOLN
INTRAVENOUS | Status: DC
  Administered 2017-10-20: 10:00:00 via INTRAVENOUS

## 2017-10-20 MED ORDER — SODIUM CHLORIDE 0.9 % IV SOLN
250.0000 mL | INTRAVENOUS | Status: DC | PRN
Start: 2017-10-20 — End: 2017-10-22

## 2017-10-20 MED ORDER — SODIUM CHLORIDE 0.9% FLUSH: 3.0000 mL | INTRAVENOUS | Status: DC | PRN

## 2017-10-20 MED ORDER — HEPARIN (PORCINE) IN NACL 2-0.9 UNIT/ML-% IJ SOLN
INTRAMUSCULAR | Status: AC
  Filled 2017-10-20: qty 1000

## 2017-10-20 MED ORDER — HEPARIN (PORCINE) IN NACL 2-0.9 UNIT/ML-% IJ SOLN
INTRAMUSCULAR | Status: AC | PRN
  Administered 2017-10-20: 1000 mL

## 2017-10-20 MED ORDER — MIDAZOLAM HCL 5 MG/ML IJ SOLN
INTRAMUSCULAR | Status: AC
  Filled 2017-10-20: qty 2

## 2017-10-20 MED ORDER — FENTANYL CITRATE (PF) 100 MCG/2ML IJ SOLN
INTRAMUSCULAR | Status: DC | PRN
  Administered 2017-10-20 (×2): 25 ug via INTRAVENOUS

## 2017-10-20 MED ORDER — IOPAMIDOL (ISOVUE-370) INJECTION 76%
INTRAVENOUS | Status: AC
  Filled 2017-10-20: qty 125

## 2017-10-20 MED ORDER — IOPAMIDOL (ISOVUE-370) INJECTION 76%
INTRAVENOUS | Status: DC | PRN
  Administered 2017-10-20: 75 mL via INTRA_ARTERIAL

## 2017-10-20 MED ORDER — METOPROLOL TARTRATE 12.5 MG HALF TABLET
12.5000 mg | ORAL_TABLET | Freq: Two times a day (BID) | ORAL | Status: DC
  Administered 2017-10-21 – 2017-10-22 (×3): 12.5 mg via ORAL
  Filled 2017-10-20 (×3): qty 1

## 2017-10-20 MED ORDER — SODIUM CHLORIDE 0.9 % IV SOLN: INTRAVENOUS | Status: AC

## 2017-10-20 MED ORDER — MIDAZOLAM HCL 10 MG/2ML IJ SOLN
INTRAMUSCULAR | Status: DC | PRN
  Administered 2017-10-20 (×2): 2 mg via INTRAVENOUS

## 2017-10-20 MED ORDER — ASPIRIN 81 MG PO CHEW
81.0000 mg | CHEWABLE_TABLET | ORAL | Status: AC
  Administered 2017-10-20: 81 mg via ORAL
  Filled 2017-10-20: qty 1

## 2017-10-20 MED ORDER — BUTAMBEN-TETRACAINE-BENZOCAINE 2-2-14 % EX AERO
INHALATION_SPRAY | CUTANEOUS | Status: DC | PRN
  Administered 2017-10-20: 2 via TOPICAL

## 2017-10-20 MED ORDER — SODIUM CHLORIDE 0.9% FLUSH: 3.0000 mL | Freq: Two times a day (BID) | INTRAVENOUS | Status: DC

## 2017-10-20 MED ORDER — FENTANYL CITRATE (PF) 100 MCG/2ML IJ SOLN
INTRAMUSCULAR | Status: AC
  Filled 2017-10-20: qty 2

## 2017-10-20 MED ORDER — LIDOCAINE HCL (PF) 1 % IJ SOLN
INTRAMUSCULAR | Status: DC | PRN
  Administered 2017-10-20: 15 mL via SUBCUTANEOUS

## 2017-10-20 MED ORDER — ASPIRIN 81 MG PO CHEW
81.0000 mg | CHEWABLE_TABLET | Freq: Every day | ORAL | Status: DC
  Administered 2017-10-21 – 2017-10-22 (×2): 81 mg via ORAL
  Filled 2017-10-20 (×2): qty 1

## 2017-10-20 MED ORDER — FUROSEMIDE 20 MG PO TABS
20.0000 mg | ORAL_TABLET | Freq: Every day | ORAL | Status: DC
  Administered 2017-10-21 – 2017-10-22 (×2): 20 mg via ORAL
  Filled 2017-10-20 (×2): qty 1

## 2017-10-20 SURGICAL SUPPLY — 13 items
CATH INFINITI 5FR AL1 (CATHETERS) ×2 IMPLANT
CATH INFINITI 5FR MULTPACK ANG (CATHETERS) ×2 IMPLANT
CATH SWAN GANZ 7F STRAIGHT (CATHETERS) ×2 IMPLANT
KIT HEART LEFT (KITS) ×2 IMPLANT
PACK CARDIAC CATHETERIZATION (CUSTOM PROCEDURE TRAY) ×2 IMPLANT
SHEATH PINNACLE 5F 10CM (SHEATH) ×2 IMPLANT
SHEATH PINNACLE 7F 10CM (SHEATH) ×2 IMPLANT
SYR MEDRAD MARK V 150ML (SYRINGE) IMPLANT
TRANSDUCER W/STOPCOCK (MISCELLANEOUS) ×2 IMPLANT
TUBING CIL FLEX 10 FLL-RA (TUBING) IMPLANT
WIRE EMERALD 3MM-J .035X150CM (WIRE) ×2 IMPLANT
WIRE EMERALD 3MM-J .035X260CM (WIRE) ×2 IMPLANT
WIRE EMERALD ST .035X150CM (WIRE) ×2 IMPLANT

## 2017-10-20 NOTE — Plan of Care (Signed)
  Completed/[facility] Behavior/Discharge Planning: Ability to manage health-related needs will improve 10/20/2017 0804 - Completed/Met by Evert Kohl, RN Clinical Measurements: Ability to maintain clinical measurements within normal limits will improve 10/20/2017 0804 - Completed/Met by Evert Kohl, RN Will remain free from infection 10/20/2017 0804 - Completed/Met by Evert Kohl, RN Diagnostic test results will improve 10/20/2017 0804 - Completed/Met by Evert Kohl, RN Respiratory complications will improve 10/20/2017 0804 - Completed/Met by Evert Kohl, RN Cardiovascular complication will be avoided 10/20/2017 0804 - Completed/Met by Evert Kohl, RN Activity: Risk for activity intolerance will decrease 10/20/2017 0804 - Completed/Met by Evert Kohl, RN Nutrition: Adequate nutrition will be maintained 10/20/2017 0804 - Completed/Met by Evert Kohl, RN Coping: Level of anxiety will decrease 10/20/2017 0804 - Completed/Met by Evert Kohl, RN Elimination: Will not experience complications related to bowel motility 10/20/2017 0804 - Completed/Met by Evert Kohl, RN Will not experience complications related to urinary retention 10/20/2017 0804 - Completed/Met by Evert Kohl, RN Pain Managment: General experience of comfort will improve 10/20/2017 0804 - Completed/Met by Evert Kohl, RN Safety: Ability to remain free from injury will improve 10/20/2017 0804 - Completed/Met by Evert Kohl, RN Skin Integrity: Risk for impaired skin integrity will decrease 10/20/2017 0804 - Completed/Met by Evert Kohl, RN

## 2017-10-20 NOTE — Care Management Important Message (Signed)
Important Message  Patient Details  Name: Denise Cuevas MRN: [id number] Date of Birth: Mar 19, 1951   Medicare Important Message Given:  Yes    Orbie Pyo 10/20/2017, 2:49 PM

## 2017-10-20 NOTE — Progress Notes (Signed)
Received patient post Ca Cath, Right femoral site level 0, site dressing CDI. Patient alert and oriented.

## 2017-10-20 NOTE — Progress Notes (Signed)
  Echocardiogram Echocardiogram Transesophageal has been performed.  Jennette Dubin 10/20/2017, 2:54 PM

## 2017-10-20 NOTE — Progress Notes (Signed)
Site area: rt groin fa sheath Site Prior to Removal:  Level 0 Pressure Applied For:  20 minutes Manual:   yes Patient Status During Pull:  stable Post Pull Site:  Level  0 Post Pull Instructions Given:  yes Post Pull Pulses Present: palpable Dressing Applied:  Gauze and tegaderm Bedrest begins @ 3837 Comments:

## 2017-10-20 NOTE — Progress Notes (Signed)
Patient ID: KIYOKO MCGUIRT, female   DOB: September 15, 1951, 66 y.o.   MRN: [id number]   Progress Note  Patient Name: Denise Cuevas Date of Encounter: 10/20/2017  Primary Cardiologist: [name]  Subjective   Much less dyspnea and trace LE edema  Inpatient Medications    Scheduled Meds: . aspirin  81 mg Oral Daily  . atorvastatin  40 mg Oral q1800  . DULoxetine  90 mg Oral Q lunch  . ferrous sulfate  325 mg Oral Q breakfast  . insulin aspart  0-9 Units Subcutaneous TID WC  . insulin aspart protamine- aspart  40 Units Subcutaneous BID AC  . levothyroxine  50 mcg Oral QAC breakfast  . mometasone-formoterol  2 puff Inhalation BID  . pantoprazole  80 mg Oral Daily  . polyethylene glycol  17 g Oral Daily  . sodium chloride flush  3 mL Intravenous Q12H  . spironolactone  12.5 mg Oral Daily   Continuous Infusions: . sodium chloride     PRN Meds: sodium chloride, acetaminophen, acetaminophen-codeine, albuterol, nitroGLYCERIN, ondansetron (ZOFRAN) IV   Vital Signs    Vitals:   10/19/17 1200 10/19/17 2034 10/19/17 2045 10/20/17 0500  BP: 119/70 (!) 101/48  (!) 127/56  Pulse: 83 93  88  Resp: 20 20  20   Temp: 98.1 F (36.7 C) 98.6 F (37 C)  97.7 F (36.5 C)  TempSrc: Oral Oral  Oral  SpO2: 100% 100% 99% 100%  Weight:    162 lb 9.6 oz (73.8 kg)  Height:        Intake/Output Summary (Last 24 hours) at 10/20/2017 0832 Last data filed at 10/20/2017 7124 Gross per 24 hour  Intake 1062 ml  Output 4100 ml  Net -3038 ml   Filed Weights   10/18/17 0627 10/19/17 0633 10/20/17 0500  Weight: 166 lb 11.2 oz (75.6 kg) 164 lb (74.4 kg) 162 lb 9.6 oz (73.8 kg)    Telemetry    NSR 10/20/2017  - Personally Reviewed  ECG    NSR no acute ST changes  Poor R wave progression  - Personally Reviewed  Physical Exam   GEN: No acute distress.   Neck: No JVD Cardiac: RRR AS  murmurs, rubs, or gallops.  Respiratory: Clear to auscultation bilaterally. GI: Soft, nontender, non-distended   MS: No edema; No deformity. Neuro:  Nonfocal  Psych: Normal affect  Trace LE edema   Labs    Chemistry Recent Labs  Lab 10/15/17 1843 10/16/17 0428 10/17/17 0516 10/18/17 0601 10/19/17 0357  NA 137 140 138 134* 136  K 3.1* 3.7 3.7 3.8 3.8  CL 104 106 105 102 103  CO2 26 26 25 22 26   GLUCOSE 162* 283* 195* 200* 166*  BUN 10 15 15  22* 22*  CREATININE 0.96 1.13* 1.01* 1.17* 1.06*  CALCIUM 7.9* 8.1* 8.4* 8.7* 8.5*  PROT 5.0* 4.7* 4.9*  --   --   ALBUMIN 2.4* 2.1* 2.2*  --   --   AST 71* 62* 58*  --   --   ALT 39 33 34  --   --   ALKPHOS 146* 119 116  --   --   BILITOT 2.3* 2.4* 2.2*  --   --   GFRNONAA >60 51* 58* 49* 55*  GFRAA >60 59* >60 56* >60  ANIONGAP 7 8 8 10 7      Hematology Recent Labs  Lab 10/17/17 0516 10/18/17 0601 10/19/17 0357  WBC 5.0 11.3* 7.6  RBC 3.84* 4.26 4.05  HGB 8.5* 9.3* 8.9*  HCT 28.6* 31.5* 29.9*  MCV 74.5* 73.9* 73.8*  MCH 22.1* 21.8* 22.0*  MCHC 29.7* 29.5* 29.8*  RDW 18.8* 18.8* 18.6*  PLT 76* 119* 99*    Cardiac Enzymes Recent Labs  Lab 10/15/17 1843 10/15/17 2318 10/16/17 0428  TROPONINI <0.03 <0.03 <0.03    Recent Labs  Lab 10/15/17 1313  TROPIPOC 0.00     BNP Recent Labs  Lab 10/15/17 1304 10/15/17 1843  BNP 125.3* 146.1*     DDimer No results for input(s): DDIMER in the last 168 hours.   Radiology    No results found.  Cardiac Studies   Echo  Study Conclusions  - Left ventricle: The cavity size was normal. Systolic function was   normal. The estimated ejection fraction was in the range of 60%   to 65%. Wall motion was normal; there were no regional wall   motion abnormalities. Mitral stenosis limits evaluation of LV   diastolic function. Doppler parameters are consistent with   elevated mean left atrial filling pressure. - Aortic valve: Valve mobility was restricted. There was moderate   stenosis. Valve area (VTI): 1.05 cm^2. - Mitral valve: Calcified annulus. Mild diffuse thickening,    consistent with rheumatic disease. Mobility was restricted.   Transvalvular velocity was increased. The findings are consistent   with mild to moderate stenosis. There was moderate regurgitation   directed centrally. Mean gradient (D): 15 mm Hg. Gradients   recorded at 88 bpm. Planimetered valve area: 1.53 cm^2. Valve   area by pressure half-time: 1.49 cm^2. Valve area by continuity   equation (using LVOT flow): 1.26 cm^2. - Left atrium: The atrium was mildly dilated.  Patient Profile     66 y.o. female previous CABG 2012 LIMA to LAD, SVG to OM with DES to distal anastomosis SVG OM 2013 and distal LAD Admitted with CHF, dyspnea and bronchitic cough Found to have progressive valve disease by echo with AS/MS.  Improved with diuresis   Assessment & Plan    1) Valve disease. Add lasix back and beta blocker to maximize diastolic filling time. Have arranged TEE and right and left heat cath for today Given grafts and need to do hemodynamics for both AS/MS probably best to do from femoral artery and vein. I believe Dr Servando Snare did original surgery and will need to be consulted for possible redo. Depending on TEE hybrid approach with possible [place] balloon valvuloplasty and TAVR may be an option rather than high risk redo bi valve surgery   Orders written labs called   For questions or updates, please contact [place] Please consult [email or web address] for contact info under Cardiology/STEMI.      Signed, Jenkins Rouge, MD  10/20/2017, 8:32 AM

## 2017-10-20 NOTE — Consult Note (Signed)
[address]       [place] [address]             [phone]        Laelah E [place] Medical Record #[id number] Date of Birth: [birth year]  Referring: Dr Johnsie Cancel  Primary Care: Will be a new patient of [name], MD likely December  Chief Complaint:    Chief Complaint  Patient presents with  . Shortness of Breath  . Chest Pain  . Leg Swelling    Previous cardiac surgery: DATE OF PROCEDURE: 11/27/2011   OPERATIVE REPORT  PREOPERATIVE DIAGNOSES: Coronary occlusive disease with recent  subendocardial myocardial infarction.  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  SURGICAL PROCEDURE: Coronary artery bypass grafting x2 with the left  internal mammary to the left anterior descending coronary artery and  reverse saphenous vein graft to the circumflex coronary artery, with  endo-vein harvesting from the right thigh.   HPI:  This is a 66 year old Caucasian female with a past medical history of coronary artery disease (s/p  and CABG x 2 11/2011 and stents placed 2013), diabetes mellitus, non alcoholic cirrhosis, asthma,  who presented on 10/15/2017 with complaints of intermittent non exertional chest pain, worsening shortness of breath, fatigue, and cough. She states she had intermittent dizziness as well.  Regarding her cough, she went to [facility] who told her she had acute bronchitis and COPD. She was given a course of Azithromycin, without much improvement in her symptoms.   She then presented to her cardiologist's office on 10/15/2017 and was seen by Truitt Merle NP. She presented with complaints of "something is wrong but unsure of what".  Specifically, she had had intermittent chest pain (both with exertion and at rest) and was sleepy and tired. In addition, she had complaints of leg redness which she used hydrocortisone cream with some improvement. She was admitted  to [facility] for further evaluation and treatment. EKG apparently showed no acute ST changes.  Initial and subsequent Troponin I was less than 0.03. Echo done 10/16/2017 showed LVEF 60-65%,  moderate AS, moderate MR, and moderate to severe MS. A cardiac catheterization was done on 10/16/2017 and showed previously placed Ost Cx to Prox Cx stent (unknown type) is widely patent, previously placed 2nd Mrg stent (unknown type) is widely patent, Ost 2nd Mrg lesion is 50% stenosed, prox LAD lesion is 100% stenosed, previously placed Mid LAD to Dist LAD stent (unknown type) is widely patent, moderate aortic valve stenosis, and moderate mitral valve stenosis. A cardiothoracic consultation has been requested with Dr. Servando Snare. Currently, the patient denies chest pain or shortness of breath.  Current Activity/ Functional Status: Patient was independent with mobility/ambulation, transfers, ADL's, IADL's.   Zubrod Score: At the time of surgery this patient's most appropriate activity status/level should be described as: []     0    Normal activity, no symptoms []     1    Restricted in physical strenuous activity but ambulatory, able to do out light work [x]     2    Ambulatory and capable of self care, unable to do work activities, up and about more than 50% of the time                            []     3    Only limited self  care, in bed greater than 50% of waking hours []     4    Completely disabled, no self care, confined to bed or chair []     5    Moribund  Past Medical History:  Diagnosis Date  . Angina   . Asthma   . CHF (congestive heart failure) ([place])   . 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 Coronary artery disease    s/p CABG  . Depression   . Diabetes mellitus    type 2  . Dyspnea   . Edema extremities    consistent edema lower legs, feet, and right quadrant abdominal pain-"goes and comes"  . Family history of adverse reaction to anesthesia    SISTER HAS NAUSEA  .  Fibromyalgia   . GERD (gastroesophageal reflux disease)   . H/O hiatal hernia   . Headache(784.0)    occ. generalized  . Heart murmur   . Hypercholesteremia   . Hypertension   . Hypothyroid   . Myocardial infarction [facility]) 11/2011   MI x2- 3'09,4'07 coronary stent(chest pain episode at time)  . PONV (postoperative nausea and vomiting)     Past Surgical History:  Procedure Laterality Date  . CHOLECYSTECTOMY     open many yrs ago  . CORONARY ANGIOPLASTY WITH STENT PLACEMENT  05/06/2012  . KNEE SURGERY Right    scope surgery    Social History   Socioeconomic History  . Marital status: Married    Spouse name: Not on file  . Number of children: Not on file  . Years of education: Not on file  . Highest education level: Not on file  Occupational History  . Occupation: Retired Biomedical scientist, Environmental consultant principal  Tobacco Use  . Smoking status: Never Smoker  . Smokeless tobacco: Never Used  Substance and Sexual Activity  . Alcohol use: No  . Drug use: No  . Sexual activity: Yes    Allergies  Allergen Reactions  . Exenatide Nausea And Vomiting  . Ace Inhibitors Other (See Comments)    pseudoasthma    Current Facility-Administered Medications  Medication Dose Route Frequency Provider Last Rate Last Dose  . [MAR Hold] 0.9 %  sodium chloride infusion  250 mL Intravenous PRN Truitt Merle C, NP      . 0.9 %  sodium chloride infusion   Intravenous Continuous Burnell Blanks, MD 50 mL/hr at 10/20/17 1642    . [MAR Hold] acetaminophen (TYLENOL) tablet 650 mg  650 mg Oral Q4H PRN Burtis Junes, NP      . Doug Sou Hold] acetaminophen-codeine (TYLENOL #3) 300-30 MG per tablet 1 tablet  1 tablet Oral Q6H PRN Everrett Coombe, MD   1 tablet at 10/17/17 2117  . [MAR Hold] albuterol (PROVENTIL) (2.5 MG/3ML) 0.083% nebulizer solution 2.5 mg  2.5 mg Nebulization Q4H PRN Truitt Merle C, NP   2.5 mg at 10/18/17 0819  . [MAR Hold] aspirin chewable tablet 81 mg  81 mg Oral Daily  Andrena Mews T, MD      . Doug Sou Hold] atorvastatin (LIPITOR) tablet 40 mg  40 mg Oral q1800 Rogue Bussing, MD   40 mg at 10/19/17 1645  . [MAR Hold] DULoxetine (CYMBALTA) DR capsule 90 mg  90 mg Oral Q lunch Olene Floss [place], MD   90 mg at 10/19/17 1212  . [MAR Hold] ferrous sulfate tablet 325 mg  325 mg Oral Q breakfast Andrena Mews T, MD   325 mg at 10/19/17 0821  . [  MAR Hold] furosemide (LASIX) tablet 20 mg  20 mg Oral Daily [name], MD      . Doug Sou Hold] insulin aspart (novoLOG) injection 0-9 Units  0-9 Units Subcutaneous TID WC Kinnie Feil, MD   2 Units at 10/20/17 1228  . [MAR Hold] insulin aspart protamine- aspart (NOVOLOG MIX 70/30) injection 40 Units  40 Units Subcutaneous BID AC Rogue Bussing, MD   40 Units at 10/19/17 1644  . [MAR Hold] levothyroxine (SYNTHROID, LEVOTHROID) tablet 50 mcg  50 mcg Oral QAC breakfast Andrena Mews T, MD   50 mcg at 10/19/17 0641  . [MAR Hold] metoprolol tartrate (LOPRESSOR) tablet 12.5 mg  12.5 mg Oral BID [name], MD      . Doug Sou Hold] mometasone-formoterol [facility]) 100-5 MCG/ACT inhaler 2 puff  2 puff Inhalation BID Rogue Bussing, MD   2 puff at 10/20/17 [phone]  . [MAR Hold] nitroGLYCERIN (NITROSTAT) SL tablet 0.4 mg  0.4 mg Sublingual Q5 Min x 3 PRN Burtis Junes, NP      . Doug Sou Hold] ondansetron (ZOFRAN) injection 4 mg  4 mg Intravenous Q6H PRN Burtis Junes, NP      . Doug Sou Hold] pantoprazole (PROTONIX) EC tablet 80 mg  80 mg Oral Daily Rogue Bussing, MD   80 mg at 10/20/17 1006  . [MAR Hold] polyethylene glycol (MIRALAX / GLYCOLAX) packet 17 g  17 g Oral Daily Carlyle Dolly, MD   17 g at 10/19/17 0931  . [MAR Hold] sodium chloride flush (NS) 0.9 % injection 3 mL  3 mL Intravenous Q12H Truitt Merle C, NP   3 mL at 10/19/17 2203  . [MAR Hold] spironolactone (ALDACTONE) tablet 12.5 mg  12.5 mg Oral Daily Carlyle Dolly, MD   12.5 mg at 10/20/17 1008     Medications Prior to Admission  Medication Sig Dispense Refill Last Dose  . albuterol (PROVENTIL HFA;VENTOLIN HFA) 108 (90 BASE) MCG/ACT inhaler Inhale 2 puffs into the lungs every 6 (six) hours as needed for wheezing or shortness of breath. 1 Inhaler 2 10/15/2017 at Unknown time  . albuterol (PROVENTIL) (2.5 MG/3ML) 0.083% nebulizer solution Take 3 mLs (2.5 mg total) by nebulization every 2 (two) hours as needed for wheezing. 75 mL 0 10/14/2017  . aspirin 81 MG tablet Take 81 mg by mouth daily.   10/14/2017  . atorvastatin (LIPITOR) 40 MG tablet Take 40 mg by mouth daily.   10/14/2017  . budesonide-formoterol (SYMBICORT) 80-4.5 MCG/ACT inhaler Inhale 2 puffs into the lungs 2 (two) times daily.   10/14/2017  . DULoxetine (CYMBALTA) 30 MG capsule Take 90 mg by mouth daily with lunch. In conjunction with one 60 mg capsule to equal a total of 90 milligrams   10/14/2017  . DULoxetine (CYMBALTA) 60 MG capsule Take 90 mg by mouth daily with lunch. In conjunction with one 30 mg capsule to equal a total of 90 milligrams   10/14/2017  . furosemide (LASIX) 40 MG tablet Take 1 tablet (40 mg total) by mouth daily. 30 tablet 0 10/14/2017  . HUMALOG MIX 75/25 KWIKPEN (75-25) 100 UNIT/ML Kwikpen INJECT 60 UNITS SUBCUTANEOUSLY BEFORE BREAKFAST AND 60 UNITS BEFORE EVENING MEAL  10 10/15/2017  . JARDIANCE 25 MG TABS tablet Take 25 mg daily by mouth.  5 10/14/2017  . levothyroxine (SYNTHROID, LEVOTHROID) 50 MCG tablet Take 50 mcg by mouth daily before breakfast.   10/14/2017  . metoprolol tartrate (LOPRESSOR) 25 MG tablet Take 12.5 mg by  mouth 2 (two) times daily.    10/14/2017  . nitroGLYCERIN (NITROSTAT) 0.4 MG SL tablet Place 0.4 mg under the tongue every 5 (five) minutes as needed. For chest pain    at prn  . omeprazole-sodium bicarbonate (ZEGERID) 40-1100 MG per capsule Take 1 capsule by mouth daily before breakfast.   10/15/2017 at Unknown time  . spironolactone (ALDACTONE) 25 MG tablet Take 1 tablet (25 mg total) by  mouth daily. 30 tablet 0 10/14/2017  . hydrocortisone cream 1 % Apply topically 2 (two) times daily. Apply to right lower leg (Patient taking differently: Apply 1 application topically 2 (two) times daily as needed for itching. Apply to right lower leg) 30 g 0 Taking    Family History  Problem Relation Age of Onset  . Heart disease Father 5       died of MI  . Emphysema Father        smoked  . Peripheral vascular disease Mother 58       bilaterial amputations   Review of Systems:     Cardiac Review of Systems: Y or N  Chest Pain [  Y  ] Exertional SOB  [Y  ]  Orthopnea [ N ]   Pedal Edema [  Y ]    Palpitations [ N ] Syncope  [ N ]  Presyncope [ N  ]  General Review of Systems: [Y] = yes [ N ]=no Constitional:  fatigue [ Y ]; nausea [ N ]; night sweats Aqua.Slicker  ]; fever Aqua.Slicker  ]; or chills [ N ]                                                               Dental: poor dentition (Patient has not seen a dentist in over a year. She had an appointment but had to cancel it because too tired)  Eye : diplopia [ N  ]; Amaurosis fugax[ N ]; Resp: cough [ Y ];  wheezing[ N ];  hemoptysis[ N ];   GI:  vomiting[ N];  dysphagia[N  ]; melena[N  ];  hematochezia Aqua.Slicker  ];  GU:  hematuria[ N ];   dysuria [ N ];  nocturia[ N ];               Skin: rash, swelling[ Y ];, or redness [ Y ];  Heme/Lymph: bruising[ Y ];  anemia[ Y ];              Neuro: TIA[ N ];   stroke[N  ];  vertigo[N  ];  seizures[N  ];   Psych:depression[ Y ]; anxiety[ Y ];  Endocrine: diabetes[ Y ];  thyroid dysfunction[Y  ];    Physical Exam: BP (!) 121/41   Pulse 90   Temp 98.3 F (36.8 C) (Oral)   Resp 16   Ht 5\' 2"  (1.575 m)   Wt 162 lb (73.5 kg)   LMP  (LMP Unknown)   SpO2 96%   BMI 29.63 kg/m    General appearance: alert, cooperative and appears older than stated age Head: Normocephalic, without obvious abnormality, atraumatic Neck: no JVD, supple, symmetrical, trachea midline and AS murmur radiates to both carotid  arteries as well as murmur over apex Cardio: RRR, grade III/VI murmur heart best along  sternal border radiating to carotid arteries  GI: Soft, non tender, bowel sounds present Extremities: Palpable DP/PT bilaterally, + pre tibial pitting edema LE Neurologic: Grossly normal  Diagnostic Studies & Laboratory data:     Recent Radiology Findings:  Dg Chest 2 View  Result Date: 10/15/2017 CLINICAL DATA:  Chest pain, shortness of breath and cough for 4 weeks. EXAM: CHEST  2 VIEW COMPARISON:  10/12/2017 and prior radiographs FINDINGS: Cardiomegaly and CABG changes again noted. There is no evidence of focal airspace disease, pulmonary edema, suspicious pulmonary nodule/mass, pleural effusion, or pneumothorax. No acute bony abnormalities are identified. IMPRESSION: Cardiomegaly without evidence of acute cardiopulmonary disease. Electronically Signed   By: Margarette Canada M.D.   On: 10/15/2017 13:58   US Abdomen Complete  Result Date: 10/10/2017 CLINICAL DATA:  66 year old female with cirrhosis. Hepatocellular carcinoma screening. History of cholecystectomy. EXAM: ABDOMEN ULTRASOUND COMPLETE COMPARISON:  03/31/2017 and prior exams FINDINGS: Gallbladder: The gallbladder is not visualized compatible with cholecystectomy. Common bile duct: Diameter: 7 mm. No intrahepatic or extrahepatic biliary dilatation. Liver: Nodular contour and coarse hepatic echotexture compatible with cirrhosis. No focal hepatic masses are identified. Portal vein is patent on color Doppler imaging with normal direction of blood flow towards the liver. IVC: No abnormality visualized. Pancreas: Visualized portion unremarkable. Spleen: Splenomegaly again noted with a splenic volume of 715 cc Right Kidney: Length: 11.4 cm. Echogenicity within normal limits. No mass or hydronephrosis visualized. Left Kidney: Length: 11.7 cm. Echogenicity within normal limits. No mass or hydronephrosis visualized. Abdominal aorta: No aneurysm visualized. Other  findings: None.  No ascites noted. IMPRESSION: 1. Cirrhosis without evidence of focal mass. 2. Splenomegaly again identified. 3. No other significant abnormalities. Electronically Signed   By: Margarette Canada M.D.   On: 10/10/2017 10:26   I have independently reviewed the above radiologic studies.  Recent Lab Findings: Lab Results  Component Value Date   WBC 6.5 10/20/2017   HGB 9.4 (L) 10/20/2017   HCT 31.6 (L) 10/20/2017   PLT 99 (L) 10/20/2017   GLUCOSE 166 (H) 10/19/2017   CHOL 121 10/18/2017   TRIG 61 10/18/2017   HDL 33 (L) 10/18/2017   LDLCALC 76 10/18/2017   ALT 34 10/17/2017   AST 58 (H) 10/17/2017   NA 136 10/19/2017   K 3.8 10/19/2017   CL 103 10/19/2017   CREATININE 1.06 (H) 10/19/2017   BUN 22 (H) 10/19/2017   CO2 26 10/19/2017   TSH 2.664 10/15/2017   INR 1.31 10/15/2017   HGBA1C 8.4 (H) 10/15/2017   Imaging Studies:  RIGHT/LEFT HEART CATH AND CORONARY/GRAFT ANGIOGRAPHY by Dr. Angelena Form on 10/20/2017:  Conclusion     Prox RCA to Mid RCA lesion is 20% stenosed.  Previously placed Ost Cx to Prox Cx stent (unknown type) is widely patent.  Previously placed 2nd Mrg stent (unknown type) is widely patent.  Ost 2nd Mrg lesion is 50% stenosed.  Prox LAD lesion is 100% stenosed.  SVG graft was not visualized.  LIMA graft was visualized by angiography and is normal in caliber.  The graft exhibits no disease.  Previously placed Mid LAD to Dist LAD stent (unknown type) is widely patent.  Ost LAD to Prox LAD lesion is 20% stenosed.  There is moderate aortic valve stenosis.  There is moderate mitral valve stenosis.  Hemodynamic findings consistent with moderate pulmonary hypertension.  LV end diastolic pressure is normal.   1. Double vessel CAD s/p 2V CABG with 1/2 patent bypass grafts 2. The LAD courses to the  apex. The mid LAD is occluded after a moderate caliber diagonal branch. The mid and distal LAD fills from the patent LIMA graft. The stent in the  distal LAD is patent without restenosis.  3. The Circumflex artery is patent. There are patent stents in the proximal Circumflex and distal segment of the obtuse marginal branch. The proximal segment of the OM branch prior to the stent has a moderate non-obstructive stenosis. The vein graft to the OM branch is known to be occluded and was not selectively engaged today.  4. The RCA is a small to moderate caliber co-dominant vessel with mild non-obstructive plaque.  5. Moderate Aortic stenosis (peak to peak gradient 23 mmHg, mean gradient 20 mmHg, AVA 2.2 cm2) 6. Moderately severe mitral stenosis (peak to peak gradient 14 mmHg, mean gradient 12.5 mmHg, MVA 3.32cm2)  Recommendations: Medical management of CAD. Close follow up of her valve disease. Consider surgical consultation.     Hemo Data    Most Recent Value  Fick Cardiac Output 10.71 L/min  Fick Cardiac Output Index 6.12 (L/min)/BSA  Aortic Mean Gradient 20 mmHg  Aortic Peak Gradient 23 mmHg  Aortic Valve Area 2.20  Aortic Value Area Index 1.26 cm2/BSA  RA A Wave 12 mmHg  RA V Wave 10 mmHg  RA Mean 7 mmHg  RV Systolic Pressure 55 mmHg  RV Diastolic Pressure 0 mmHg  RV EDP 13 mmHg  PA Systolic Pressure 59 mmHg  PA Diastolic Pressure 20 mmHg  PA Mean 40 mmHg  PW A Wave 27 mmHg  PW V Wave 45 mmHg  PW Mean 25 mmHg  AO Systolic Pressure 132 mmHg  AO Diastolic Pressure 53 mmHg  AO Mean 76 mmHg  LV Systolic Pressure 440 mmHg  LV Diastolic Pressure -10 mmHg  LV EDP 0 mmHg  Arterial Occlusion Pressure Extended Systolic Pressure 102 mmHg  Arterial Occlusion Pressure Extended Diastolic Pressure 45 mmHg  Arterial Occlusion Pressure Extended Mean Pressure 79 mmHg  Left Ventricular Apex Extended Systolic Pressure 725 mmHg  Left Ventricular Apex Extended Diastolic Pressure 0 mmHg  Left Ventricular Apex Extended EDP Pressure 10 mmHg  QP/QS 1  TPVR Index 6.54 HRUI  TSVR Index 12.43 HRUI  PVR SVR Ratio 0.17  TPVR/TSVR Ratio 0.53    ECHO:TEE 10/20/2017 Transesophageal Echocardiography  Patient:    Dalynn, Jhaveri MR #:       [id number] Study Date: 10/20/2017 Gender:     F Age:        41 Height:     157.5 cm Weight:     73.5 kg BSA:        1.82 m^2 Pt. Status: Room:       [facility] T  ORDERING     Ena Dawley, M.D.  PERFORMING   Ena Dawley, M.D.  REFERRING    Ena Dawley, M.D.  [place] [id number]  SONOGRAPHER  Mikki Santee  cc:  ------------------------------------------------------------------- LV EF: 60% -   65%  ------------------------------------------------------------------- Indications:      Aortic stenosis 424.1.  ------------------------------------------------------------------- History:   PMH:   Murmur.  Coronary artery disease.  Congestive heart failure.  Mitral valve disease.  Risk factors:  Diabetes mellitus.  ------------------------------------------------------------------- Study Conclusions  - Left ventricle: Systolic function was normal. The estimated   ejection fraction was in the range of 60% to 65%. Wall motion was   normal; there were no regional wall motion abnormalities. - Aortic valve: Cusp separation  was moderately reduced. There was   moderate stenosis. There was mild regurgitation. - Mitral valve: Severely thickened, mildly calcified leaflets .   Leaflet separation was moderately reduced. The findings are   consistent with moderate to severe stenosis. There was moderate   regurgitation. Mean gradient (D): 10 mm Hg. - Left atrium: The atrium was dilated. No evidence of thrombus in   the atrial cavity or appendage. No evidence of thrombus in the   atrial cavity or appendage. - Right atrium: No evidence of thrombus in the atrial cavity or   appendage.  Impressions:  - Aortic valve is severely calcified predominantly in the   non-coronary leaflet, leaflet opening is moderately decreased  but   still opening. Peak/mean transaortic gradients were 64/33 mmHg.   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 There is at most   moderate mitral regurgitation but moderate to severe mitral   stenosis with mean transmitral gradient 10 mmHg.  ------------------------------------------------------------------- Study data:   Study status:  Routine.  Consent:  The risks, benefits, and alternatives to the procedure were explained to the patient and informed consent was obtained.  Procedure:  The patient reported no pain pre or post test. Initial setup. The patient was brought to the laboratory. Surface ECG leads were monitored. Sedation. Conscious sedation was administered by cardiology staff. Transesophageal echocardiography. Topical anesthesia was obtained using viscous lidocaine. A transesophageal probe was inserted by the attending cardiologist. Image quality was adequate.  Study completion:  The patient tolerated the procedure well. There were no complications.  Administered medications:   Fentanyl, 49mcg. Midazolam, 4mg .          Diagnostic transesophageal echocardiography.  2D and color Doppler.  Birthdate:  Patient birthdate: Jul 31, 1951.  Age:  Patient is 66 yr old.  Sex:  Gender: female.    BMI: 29.6 kg/m^2.  Blood pressure:     110/39  Patient status:  Inpatient.  Study date:  Study date: 10/20/2017. Study time: 02:01 PM.  Location:  Endoscopy.  -------------------------------------------------------------------  ------------------------------------------------------------------- Left ventricle:  Systolic function was normal. The estimated ejection fraction was in the range of 60% to 65%. Wall motion was normal; there were no regional wall motion abnormalities.  ------------------------------------------------------------------- Aortic valve:   Trileaflet; severely thickened, severely  calcified leaflets. Cusp separation was moderately reduced.  Doppler:   There was moderate stenosis.   There was mild regurgitation.    Mean gradient (S): 33 mm Hg. Peak gradient (S): 64 mm Hg.  ------------------------------------------------------------------- Aorta:  There was mild atheroma. There was no evidence for dissection. Aortic root: The aortic root was not dilated. Ascending aorta: The ascending aorta was normal in size. Descending aorta: The descending aorta was normal in size.  ------------------------------------------------------------------- Mitral valve:   Severely thickened, mildly calcified leaflets . Leaflet separation was moderately reduced.  Doppler:   The findings are consistent with moderate to severe stenosis.   There was moderate regurgitation.    Mean gradient (D): 10 mm Hg.  ------------------------------------------------------------------- Left atrium:  The atrium was dilated.  No evidence of thrombus in the atrial cavity or appendage.  No evidence of thrombus in the atrial cavity or appendage. The appendage was morphologically a left appendage, multilobulated, and of normal size. Emptying velocity was normal.  ------------------------------------------------------------------- Right ventricle:  The cavity size was normal. Wall thickness was normal. Systolic function was normal.  ------------------------------------------------------------------- Pulmonic valve:    Structurally normal valve.  ------------------------------------------------------------------- Tricuspid valve:  Structurally normal valve.   Leaflet separation was normal.  Doppler:  There was mild regurgitation.  ------------------------------------------------------------------- Pulmonary artery:   The main pulmonary artery was normal-sized.  ------------------------------------------------------------------- Right atrium:  The atrium was normal in size.  No evidence  of thrombus in the atrial cavity or appendage. The appendage was morphologically a right appendage.  ------------------------------------------------------------------- Pericardium:  There was no pericardial effusion.  ------------------------------------------------------------------- Measurements   Aortic valve                              Value  Aortic valve peak velocity, S             401   cm/s  Aortic valve mean velocity, S             238   cm/s  Aortic valve VTI, S                       76.5  cm  Aortic mean gradient, S                   28    mm Hg  Aortic peak gradient, S                   64    mm Hg    Mitral valve                              Value  Mitral mean velocity, D                   148   cm/s  Mitral mean gradient, D                   10    mm Hg  Mitral annulus VTI, D                     59.3  cm  Mitral maximal regurg velocity, PISA      535   cm/s  Mitral regurg VTI, PISA                   151   cm  Legend: (L)  and  (H)  mark values outside specified reference range.  ------------------------------------------------------------------- Prepared and Electronically Authenticated by  Ena Dawley, M.D. 2018-11-12T15:05:21  Assessment / plan:   1.  Moderate aortic stenosis (peak to peak gradient 23 mmHg, mean gradient 20 mmHg, AVA 2.2 cm2) 2.  Moderately severe mitral stenosis (peak to peak gradient 14 mmHg, mean gradient 12.5 mmHg, MVA 3.32cm2) 3. Diabetes insulin-dependent. HGA1C done 10/15/2017 8.4. Needs better glucose control. Will need closed follow up with medical doctor after discharge. 4. Hypothyroidism: Recent TSH 2.66 5. History of iron deficiency anemia,thrombocytopenia, and splenomegaly-questionable if this is related to cirrhosis 6. Acute diastolic HF/valvular heart disease. Dr. Servando Snare to evaluate need for aortic valve replacement and mitral repair vs replacement.   I  spent 15 minutes counseling the patient face to face and 50%  or more the  time was spent in counseling and coordination of care. The total time spent in the appointment was 60 minutes.  Lars Pinks PA-C         [address] [place] [address] Office [phone]   Murlean Hark [phone]   The patient was discharged home before I could see  the patient, she has an appointment to come and see me on November 19. [name] MD      [address] [place] [address] Office [phone]   [place]

## 2017-10-20 NOTE — Interval H&P Note (Signed)
History and Physical Interval Note:  10/20/2017 2:57 PM  Laruth Bouchard  has presented today for cardiac cath with the diagnosis of CAD/aortic stenosis/mitral stenosis/unstable angina/CHF. The various methods of treatment have been discussed with the patient and family. After consideration of risks, benefits and other options for treatment, the patient has consented to  Procedure(s): RIGHT/LEFT HEART CATH AND CORONARY/GRAFT ANGIOGRAPHY (N/A) as a surgical intervention .  The patient's history has been reviewed, patient examined, no change in status, stable for surgery.  I have reviewed the patient's chart and labs.  Questions were answered to the patient's satisfaction.    Cath Lab Visit (complete for each Cath Lab visit)  Clinical Evaluation Leading to the Procedure:   ACS: No.  Non-ACS:    Anginal Classification: CCS III  Anti-ischemic medical therapy: One antianginal medication  Non-Invasive Test Results: No non-invasive testing performed  Prior CABG: Previous CABG        Lauree Chandler

## 2017-10-20 NOTE — Progress Notes (Signed)
Family Medicine Teaching Service Daily Progress Note Intern Pager: [phone]  Patient name: Denise Cuevas Medical record number: [id number] Date of birth: 1950-12-12 Age: 66 y.o. Gender: female  Primary Care Provider: No primary care provider on file. Consultants:Cardiology Code Status:DNR/DNI (confirmed on admission)  Assessment and Plan: Denise Cuevas a 66 y.o.femalepresenting with 1 month of chest pain and cough. PMH is significant forCAD with CABG in 2012 and stents placed thereafter, hypothyroidism, cirrhosis (presumed NAFLD), HLD, anxiety, aortic stenosis, T2DM and COPD?  Cough/Fluid overload: Cough improving. Likely multifactorial with COPD exacerbation/bronchitis and some aspect of CHF exacerbation. Echo on 11/8 showed EF of 60-65% with moderate aortic stenosis and mitral stenosis/regurg. 3.1L UO on IV Lasix. Down 2 lbs from yesterday. Lungs clear to auscultation and no supplemental O2 needed. Net -10L since admission. Cards consulted and plan for TEE and R/L heart cath today. - Prednisone 40mg  today x3 days (Day 3 today, will DC steroids) - Levaquin 500 mg daily x 5 days (Day 5 today, will stop abx)  - Cardiology consulted, appreciate recommendations - PO lasix 20mg  starting 11/13 per cards - Strict I/O  - Daily weight - Dulera BID  - Albuterol as needed  - Tylenol with codeine syrup as needed; robitussin-dm  Chest pain: Resolved.ACS has been ruled out. Pain likely MSK related from coughing. No pain today. - Vitals per floor protocol - Continue asa 81 mg, lipitor 40 mg - Tylenol prn for pain   Hypotension: Continues to have low normal systolic in low 027'O and low diastolics into 53-66'Y. Asymptomatic.  - Hold home cozaar - Spironolactone to 12.5 mg instead of 25 mg daily - restart metoprolol per cards 12.5mg  - Lasix as above, will need to be cautious - Continue to monitor closely  Erythema of lower extremities: Resolved.   - Elevate LE when possible -  Continue to monitor   T2DM: A1c 8.4 10/15/17. On novolog 70/30 mix 40U before breakfast and dinner, jardiance 25 mg daily. Glucose ranges widely from 300's to low 100's.11 U of SSI given over last 24 hours - sensitive SSI - CBGs qACHS - novolog mix 40u BID  AKI:Cr 1.06. baseline SCr ~ 0.7/0.8. likely from lasix - Attempt to avoid nephrotoxic agents if possible  Hypothyroidism: TSH 2.66, wnl  - Continue home synthroid 50 mcg  Iron deficiency anemia/thrombocytopenia:stable; hgb of 9.3>8.9 and plt 119>99 today. Denies hemoptysis or dark stools. Low iron at 20, ferritin 17. Likely sequela of liver cirrhosis.  - Ferrous sulfate 325mg  daily  - Continue to monitor daily CBC - Miralax to avoid constipation with iron supplementation  Anxiety: - Continue home cymbalta 90 mg  - Ativan 0.5mg  BID as needed   Cirrhosis: Suspected NAFLD per medical record. Thrombocytopenia near baseline. Has been seen by [facility] in the past. [place] screening negative; splenomegaly noted on [place] 10/09/17. On spironolactone 25 mg daily at home - Spironolactone to 12.5 mg as above for hypotension - Can likely resume home Lasix on DC  FEN/GI:Carb modified/heart healthy diet afterwards; protonix Prophylaxis:SCDs (thrombocytopenia)  Disposition:Home pending cards recs  Subjective:  Patient feeling well today, no complaints.   Objective: Temp:  [97.7 F (36.5 C)-98.6 F (37 C)] 97.7 F (36.5 C) (11/12 0500) Pulse Rate:  [83-93] 88 (11/12 0500) Resp:  [20] 20 (11/12 0500) BP: (100-127)/(48-70) 127/56 (11/12 0500) SpO2:  [99 %-100 %] 100 % (11/12 0500) Weight:  [162 lb 9.6 oz (73.8 kg)] 162 lb 9.6 oz (73.8 kg) (11/12 0500)  Physical Exam: General:Older, pleasant  female in NAD Cardiovascular:RRR, S1, S2, 2+ DP, PT pulses. Respiratory: CTAB, no wheezes/crackels Gastrointestinal:+BS, soft, non-tender, non-distended.  MSK:12+ pitting edema from shins down.  Derm:No erythema across anterior shins.  No increased warmth or tenderness.  Psych:Normal mood and affect  Laboratory: Recent Labs  Lab 10/17/17 0516 10/18/17 0601 10/19/17 0357  WBC 5.0 11.3* 7.6  HGB 8.5* 9.3* 8.9*  HCT 28.6* 31.5* 29.9*  PLT 76* 119* 99*   Recent Labs  Lab 10/15/17 1843 10/16/17 0428 10/17/17 0516 10/18/17 0601 10/19/17 0357  NA 137 140 138 134* 136  K 3.1* 3.7 3.7 3.8 3.8  CL 104 106 105 102 103  CO2 26 26 25 22 26   BUN 10 15 15  22* 22*  CREATININE 0.96 1.13* 1.01* 1.17* 1.06*  CALCIUM 7.9* 8.1* 8.4* 8.7* 8.5*  PROT 5.0* 4.7* 4.9*  --   --   BILITOT 2.3* 2.4* 2.2*  --   --   ALKPHOS 146* 119 116  --   --   ALT 39 33 34  --   --   AST 71* 62* 58*  --   --   GLUCOSE 162* 283* 195* 200* 166*    Imaging/Diagnostic Tests:  Rory Percy, DO 10/20/2017, 7:24 AM [place] Intern pager: [phone], text pages welcome

## 2017-10-20 NOTE — H&P (View-Only) (Signed)
Patient ID: Denise Cuevas, female   DOB: 12-03-1951, 66 y.o.   MRN: [id number]   Progress Note  Patient Name: Denise Cuevas Date of Encounter: 10/20/2017  Primary Cardiologist: [name]  Subjective   Much less dyspnea and trace LE edema  Inpatient Medications    Scheduled Meds: . aspirin  81 mg Oral Daily  . atorvastatin  40 mg Oral q1800  . DULoxetine  90 mg Oral Q lunch  . ferrous sulfate  325 mg Oral Q breakfast  . insulin aspart  0-9 Units Subcutaneous TID WC  . insulin aspart protamine- aspart  40 Units Subcutaneous BID AC  . levothyroxine  50 mcg Oral QAC breakfast  . mometasone-formoterol  2 puff Inhalation BID  . pantoprazole  80 mg Oral Daily  . polyethylene glycol  17 g Oral Daily  . sodium chloride flush  3 mL Intravenous Q12H  . spironolactone  12.5 mg Oral Daily   Continuous Infusions: . sodium chloride     PRN Meds: sodium chloride, acetaminophen, acetaminophen-codeine, albuterol, nitroGLYCERIN, ondansetron (ZOFRAN) IV   Vital Signs    Vitals:   10/19/17 1200 10/19/17 2034 10/19/17 2045 10/20/17 0500  BP: 119/70 (!) 101/48  (!) 127/56  Pulse: 83 93  88  Resp: 20 20  20   Temp: 98.1 F (36.7 C) 98.6 F (37 C)  97.7 F (36.5 C)  TempSrc: Oral Oral  Oral  SpO2: 100% 100% 99% 100%  Weight:    162 lb 9.6 oz (73.8 kg)  Height:        Intake/Output Summary (Last 24 hours) at 10/20/2017 0832 Last data filed at 10/20/2017 8546 Gross per 24 hour  Intake 1062 ml  Output 4100 ml  Net -3038 ml   Filed Weights   10/18/17 0627 10/19/17 0633 10/20/17 0500  Weight: 166 lb 11.2 oz (75.6 kg) 164 lb (74.4 kg) 162 lb 9.6 oz (73.8 kg)    Telemetry    NSR 10/20/2017  - Personally Reviewed  ECG    NSR no acute ST changes  Poor R wave progression  - Personally Reviewed  Physical Exam   GEN: No acute distress.   Neck: No JVD Cardiac: RRR AS  murmurs, rubs, or gallops.  Respiratory: Clear to auscultation bilaterally. GI: Soft, nontender, non-distended   MS: No edema; No deformity. Neuro:  Nonfocal  Psych: Normal affect  Trace LE edema   Labs    Chemistry Recent Labs  Lab 10/15/17 1843 10/16/17 0428 10/17/17 0516 10/18/17 0601 10/19/17 0357  NA 137 140 138 134* 136  K 3.1* 3.7 3.7 3.8 3.8  CL 104 106 105 102 103  CO2 26 26 25 22 26   GLUCOSE 162* 283* 195* 200* 166*  BUN 10 15 15  22* 22*  CREATININE 0.96 1.13* 1.01* 1.17* 1.06*  CALCIUM 7.9* 8.1* 8.4* 8.7* 8.5*  PROT 5.0* 4.7* 4.9*  --   --   ALBUMIN 2.4* 2.1* 2.2*  --   --   AST 71* 62* 58*  --   --   ALT 39 33 34  --   --   ALKPHOS 146* 119 116  --   --   BILITOT 2.3* 2.4* 2.2*  --   --   GFRNONAA >60 51* 58* 49* 55*  GFRAA >60 59* >60 56* >60  ANIONGAP 7 8 8 10 7      Hematology Recent Labs  Lab 10/17/17 0516 10/18/17 0601 10/19/17 0357  WBC 5.0 11.3* 7.6  RBC 3.84* 4.26 4.05  HGB 8.5* 9.3* 8.9*  HCT 28.6* 31.5* 29.9*  MCV 74.5* 73.9* 73.8*  MCH 22.1* 21.8* 22.0*  MCHC 29.7* 29.5* 29.8*  RDW 18.8* 18.8* 18.6*  PLT 76* 119* 99*    Cardiac Enzymes Recent Labs  Lab 10/15/17 1843 10/15/17 2318 10/16/17 0428  TROPONINI <0.03 <0.03 <0.03    Recent Labs  Lab 10/15/17 1313  TROPIPOC 0.00     BNP Recent Labs  Lab 10/15/17 1304 10/15/17 1843  BNP 125.3* 146.1*     DDimer No results for input(s): DDIMER in the last 168 hours.   Radiology    No results found.  Cardiac Studies   Echo  Study Conclusions  - Left ventricle: The cavity size was normal. Systolic function was   normal. The estimated ejection fraction was in the range of 60%   to 65%. Wall motion was normal; there were no regional wall   motion abnormalities. Mitral stenosis limits evaluation of LV   diastolic function. Doppler parameters are consistent with   elevated mean left atrial filling pressure. - Aortic valve: Valve mobility was restricted. There was moderate   stenosis. Valve area (VTI): 1.05 cm^2. - Mitral valve: Calcified annulus. Mild diffuse thickening,    consistent with rheumatic disease. Mobility was restricted.   Transvalvular velocity was increased. The findings are consistent   with mild to moderate stenosis. There was moderate regurgitation   directed centrally. Mean gradient (D): 15 mm Hg. Gradients   recorded at 88 bpm. Planimetered valve area: 1.53 cm^2. Valve   area by pressure half-time: 1.49 cm^2. Valve area by continuity   equation (using LVOT flow): 1.26 cm^2. - Left atrium: The atrium was mildly dilated.  Patient Profile     66 y.o. female previous CABG 2012 LIMA to LAD, SVG to OM with DES to distal anastomosis SVG OM 2013 and distal LAD Admitted with CHF, dyspnea and bronchitic cough Found to have progressive valve disease by echo with AS/MS.  Improved with diuresis   Assessment & Plan    1) Valve disease. Add lasix back and beta blocker to maximize diastolic filling time. Have arranged TEE and right and left heat cath for today Given grafts and need to do hemodynamics for both AS/MS probably best to do from femoral artery and vein. I believe Dr Servando Snare did original surgery and will need to be consulted for possible redo. Depending on TEE hybrid approach with possible [place] balloon valvuloplasty and TAVR may be an option rather than high risk redo bi valve surgery   Orders written labs called   For questions or updates, please contact [place] Please consult [email or web address] for contact info under Cardiology/STEMI.      Signed, Jenkins Rouge, MD  10/20/2017, 8:32 AM

## 2017-10-20 NOTE — Interval H&P Note (Signed)
History and Physical Interval Note:  10/20/2017 2:05 PM  Denise Cuevas  has presented today for surgery, with the diagnosis of aortic and mitral stenosis  The various methods of treatment have been discussed with the patient and family. After consideration of risks, benefits and other options for treatment, the patient has consented to  Procedure(s): TRANSESOPHAGEAL ECHOCARDIOGRAM (TEE) (N/A) as a surgical intervention .  The patient's history has been reviewed, patient examined, no change in status, stable for surgery.  I have reviewed the patient's chart and labs.  Questions were answered to the patient's satisfaction.     Ena Dawley

## 2017-10-20 NOTE — CV Procedure (Signed)
     Transesophageal Echocardiogram Note  Denise Cuevas [id number] 03-01-1951  Procedure: Transesophageal Echocardiogram Indications: Aortic and mitral stenosis  Procedure Details Consent: Obtained Time Out: Verified patient identification, verified procedure, site/side was marked, verified correct patient position, special equipment/implants available, Radiology Safety Procedures followed,  medications/allergies/relevent history reviewed, required imaging and test results available.  Performed  Medications: During this procedure the patient is administered a total of Versed 4 mg and Fentanyl 50 mcg to achieve and maintain moderate conscious sedation.  The patient's heart rate, blood pressure, and oxygen saturation are monitored continuously during the procedure. The period of conscious sedation is 30 minutes, of which I was present face-to-face 100% of this time.  - Left ventricle: Systolic function was normal. The estimated   ejection fraction was in the range of 60% to 65%. Wall motion was   normal; there were no regional wall motion abnormalities. - Aortic valve: Cusp separation was moderately reduced. There was   moderate stenosis. There was mild regurgitation. - Mitral valve: Severely thickened, mildly calcified leaflets .   Leaflet separation was moderately reduced. The findings are   consistent with moderate to severe stenosis. There was moderate   regurgitation. Mean gradient (D): 10 mm Hg. - Left atrium: The atrium was dilated. No evidence of thrombus in   the atrial cavity or appendage. No evidence of thrombus in the   atrial cavity or appendage. - Right atrium: No evidence of thrombus in the atrial cavity or   appendage.  Impressions:  - Aortic valve is severely calcified predominantly in the   non-coronary leaflet, leaflet opening is moderately decreased but   still opening. Peak/mean transaortic gradients were 64/33 mmHg.   Mitral valve is thickened and mildly  calcified with restricted   motion consistent with rheumatic mitral valve. There is at most   moderate mitral regurgitation but moderate to severe mitral   stenosis with mean transmitral gradient 10 mmHg.  Complications: No apparent complications Patient did tolerate procedure well.  Ena Dawley, MD, [facility] 10/20/2017, 3:06 PM

## 2017-10-21 ENCOUNTER — Encounter (HOSPITAL_COMMUNITY): Payer: Self-pay | Admitting: Cardiovascular Disease

## 2017-10-21 LAB — CBC
HCT: 33.1 % — ABNORMAL LOW (ref 36.0–46.0)
Hemoglobin: 9.8 g/dL — ABNORMAL LOW (ref 12.0–15.0)
MCH: 22.4 pg — ABNORMAL LOW (ref 26.0–34.0)
MCHC: 29.6 g/dL — ABNORMAL LOW (ref 30.0–36.0)
MCV: 75.7 fL — ABNORMAL LOW (ref 78.0–100.0)
Platelets: 114 10*3/uL — ABNORMAL LOW (ref 150–400)
RBC: 4.37 MIL/uL (ref 3.87–5.11)
RDW: 19.2 % — ABNORMAL HIGH (ref 11.5–15.5)
WBC: 5.7 10*3/uL (ref 4.0–10.5)

## 2017-10-21 LAB — BASIC METABOLIC PANEL
Anion gap: 6 (ref 5–15)
BUN: 16 mg/dL (ref 6–20)
CO2: 26 mmol/L (ref 22–32)
Calcium: 8.6 mg/dL — ABNORMAL LOW (ref 8.9–10.3)
Chloride: 103 mmol/L (ref 101–111)
Creatinine, Ser: 0.96 mg/dL (ref 0.44–1.00)
GFR calc Af Amer: >60 mL/min (ref >60)
GFR calc non Af Amer: >60 mL/min (ref >60)
Glucose, Bld: 319 mg/dL — ABNORMAL HIGH (ref 65–99)
Potassium: 4.6 mmol/L (ref 3.5–5.1)
Sodium: 135 mmol/L (ref 135–145)

## 2017-10-21 LAB — GLUCOSE, CAPILLARY
GLUCOSE-CAPILLARY: 228 mg/dL — AB (ref 65–99)
Glucose-Capillary: 241 mg/dL — ABNORMAL HIGH (ref 65–99)
Glucose-Capillary: 299 mg/dL — ABNORMAL HIGH (ref 65–99)
Glucose-Capillary: 154 mg/dL — ABNORMAL HIGH (ref 65–99)

## 2017-10-21 NOTE — Progress Notes (Signed)
Family Medicine Teaching Service Daily Progress Note Intern Pager: [phone]  Patient name: Denise Cuevas Medical record number: [id number] Date of birth: 08-28-1951 Age: 66 y.o. Gender: female  Primary Care Provider: No primary care provider on file. Consultants:Cardiology Code Status:DNR/DNI (confirmed on admission)  Assessment and Plan: Denise Cuevas a 66 y.o.femalepresenting with 1 month of chest pain and cough. PMH is significant forCAD with CABG in 2012 and stents placed thereafter, hypothyroidism, cirrhosis (presumed NAFLD), HLD, anxiety, aortic stenosis, T2DM and COPD?  Cough/Fluid overload: Likely multifactorial with COPD exacerbation/bronchitis and some aspect of CHF exacerbation. Cough improving, only one episode overnight. Able to sleep almost flat overnight. Lungs clear to auscultation and no supplemental O2 needed. Up 2 lbs from yesterday., however net -12L since admission.Echo on 11/12 showed EF of 60-65% with moderate aortic stenosis (but severely calcified), mod-severe mitral stenosis, mod mitral regurg. Cath showed no acute occlusions, 1/2 patent bypass vessels.  - Cardiology following, spoke with Dr. Servando Snare, her previous cardiac surgeon, who will see the patient today for further valvular surgical evaluation  - Continue PO lasix 20mg  started today 11/13 per cards - Strict I/O  - Daily weight - Dulera BID  - Albuterolas needed - Tylenol with codeine syrup as needed; robitussin-dm - s/p 3 days of Prednisone & 5 days of Levaquin 500mg   Chest pain: Resolved.ACS has been ruled out. Pain likely MSK related from coughing. No pain today. -Vitals per floor protocol -Continue asa 81 mg, lipitor 40 mg -Tylenol prn for pain   Hypotension, improving: Continues to have low normal systolic, however improving to 696-295 systolic and low diastolics into 28-41'L. Asymptomatic.  - Hold home cozaar - Spironolactone to 12.5 mg instead of 25 mg daily - Metoprolol  restarted 11/12 per cards 12.5mg  - Lasix as above, will need to be cautious - Continue to monitor closely  Erythema of lower extremities: Resolved.   - Elevate LE when possible - Continue to monitor   T2DM: A1c 8.4 10/15/17.On novolog 70/30 mix 40U before breakfast and dinner, jardiance 25 mg daily. Glucose ranges from low 100's-200's recently.  - sensitive SSI - CBGs qACHS - novolog mix 40u BID  AKI, improving:Cr 0.96.baselineSCr ~ 0.7/0.8. - Attempt to avoid nephrotoxic agents if possible  Hypothyroidism: TSH 2.66, wnl - Continue home synthroid 50 mcg  Iron deficiency anemia/thrombocytopenia:stable; hgb of 8.9>9.8 and plt 99>114 today. Denies hemoptysis or dark stools. Low iron at 20, ferritin 17. Likely sequela of liver cirrhosis. - Ferrous sulfate 325mg  daily  - Continue to monitor daily CBC - Miralax to avoid constipation with iron supplementation  Anxiety: - Continue home cymbalta 90 mg - Ativan 0.5mg  BID as needed  Cirrhosis: Suspected NAFLD per medical record. Thrombocytopenia near baseline. Has been seen by [facility] in the past. [place] screening negative; splenomegaly noted on [place] 10/09/17. On spironolactone 25 mg daily at home. - Spironolactone to 12.5 mg as above for hypotension - Can likely resume home Lasix on DC  FEN/GI:Carb modified/heart healthy dietafterwards; protonix Prophylaxis:SCDs (thrombocytopenia)  Disposition:Home pending cards recs   Subjective: No acute events overnight. Slept well, almost flat without SOB or coughing. Denies any further chest pain.    Objective: Temp:  [98 F (36.7 C)-98.3 F (36.8 C)] 98.1 F (36.7 C) (11/13 0826) Pulse Rate:  [41-92] 87 (11/13 0826) Resp:  [11-66] 16 (11/13 0826) BP: (100-140)/(29-80) 117/62 (11/13 0826) SpO2:  [1 %-100 %] 98 % (11/13 0836) Weight:  [162 lb (73.5 kg)-164 lb 6.4 oz (74.6 kg)] 164 lb 6.4  oz (74.6 kg) (11/13 0441) Physical Exam: General:Older, pleasant female in  NAD Cardiovascular:RRR, 3+ systolic murmur, S1, E0,9+QZ, PT pulses. Respiratory:CTAB, no wheezes/crackles  Gastrointestinal:+BS, soft,non-tender, non-distended. MSK:1-2+ pitting edema from shins down. Derm:Noerythema across anterior shins. No increased warmth or tenderness. Psych:Normal mood and affect    Laboratory: Recent Labs  Lab 10/19/17 0357 10/20/17 0956 10/21/17 0458  WBC 7.6 6.5 5.7  HGB 8.9* 9.4* 9.8*  HCT 29.9* 31.6* 33.1*  PLT 99* 99* 114*   Recent Labs  Lab 10/15/17 1843 10/16/17 0428 10/17/17 0516 10/18/17 0601 10/19/17 0357 10/21/17 0458  NA 137 140 138 134* 136 135  K 3.1* 3.7 3.7 3.8 3.8 4.6  CL 104 106 105 102 103 103  CO2 26 26 25 22 26 26   BUN 10 15 15  22* 22* 16  CREATININE 0.96 1.13* 1.01* 1.17* 1.06* 0.96  CALCIUM 7.9* 8.1* 8.4* 8.7* 8.5* 8.6*  PROT 5.0* 4.7* 4.9*  --   --   --   BILITOT 2.3* 2.4* 2.2*  --   --   --   ALKPHOS 146* 119 116  --   --   --   ALT 39 33 34  --   --   --   AST 71* 62* 58*  --   --   --   GLUCOSE 162* 283* 195* 200* 166* 319*    Imaging/Diagnostic Tests:   Nickola Major, Medical Student 10/21/2017, 9:20 AM [place] Intern pager: [phone], text pages welcome  I examined the patient and discussed the assessment and plan with the medical student and I agree with her documentation.  Nanci Lakatos L. Rosalyn Gess, [facility] Resident PGY-2 10/21/2017 2:32 PM

## 2017-10-21 NOTE — Progress Notes (Signed)
Patient ID: Denise Cuevas, female   DOB: Apr 20, 1951, 66 y.o.   MRN: [id number]   Progress Note  Patient Name: Denise Cuevas Date of Encounter: 10/21/2017  Primary Cardiologist: [name]  Subjective   Feels great   Inpatient Medications    Scheduled Meds: . aspirin  81 mg Oral Daily  . atorvastatin  40 mg Oral q1800  . DULoxetine  90 mg Oral Q lunch  . ferrous sulfate  325 mg Oral Q breakfast  . furosemide  20 mg Oral Daily  . insulin aspart  0-9 Units Subcutaneous TID WC  . insulin aspart protamine- aspart  40 Units Subcutaneous BID AC  . levothyroxine  50 mcg Oral QAC breakfast  . metoprolol tartrate  12.5 mg Oral BID  . mometasone-formoterol  2 puff Inhalation BID  . pantoprazole  80 mg Oral Daily  . polyethylene glycol  17 g Oral Daily  . sodium chloride flush  3 mL Intravenous Q12H  . sodium chloride flush  3 mL Intravenous Q12H  . spironolactone  12.5 mg Oral Daily   Continuous Infusions: . sodium chloride    . sodium chloride     PRN Meds: sodium chloride, sodium chloride, acetaminophen, acetaminophen-codeine, albuterol, nitroGLYCERIN, ondansetron (ZOFRAN) IV, sodium chloride flush   Vital Signs    Vitals:   10/20/17 1950 10/20/17 2126 10/20/17 2351 10/21/17 0441  BP: (!) 110/39  (!) 127/47 (!) 100/46  Pulse: 80  85 86  Resp: 18  18 18   Temp: 98.3 F (36.8 C)  98 F (36.7 C) 98 F (36.7 C)  TempSrc: Oral  Oral Oral  SpO2: 98% 97% 97% 100%  Weight:    164 lb 6.4 oz (74.6 kg)  Height:        Intake/Output Summary (Last 24 hours) at 10/21/2017 0826 Last data filed at 10/21/2017 2536 Gross per 24 hour  Intake 240 ml  Output 1600 ml  Net -1360 ml   Filed Weights   10/20/17 0500 10/20/17 1311 10/21/17 0441  Weight: 162 lb 9.6 oz (73.8 kg) 162 lb (73.5 kg) 164 lb 6.4 oz (74.6 kg)    Telemetry    NSR 10/21/2017  - Personally Reviewed  ECG    NSR no acute ST changes  Poor R wave progression  - Personally Reviewed  Physical Exam   BP (!)  100/46 (BP Location: Left Arm)   Pulse 86   Temp 98 F (36.7 C) (Oral)   Resp 18   Ht 5\' 2"  (1.575 m)   Wt 164 lb 6.4 oz (74.6 kg) Comment: scale c  LMP  (LMP Unknown)   SpO2 100%   BMI 30.07 kg/m  Affect appropriate Healthy:  appears stated age HEENT: normal Neck supple with no adenopathy JVP normal no bruits no thyromegaly Lungs clear with no wheezing and good diaphragmatic motion Heart:  S1/S2 AS  murmur, no rub, gallop or click PMI normal Abdomen: benighn, BS positve, no tenderness, no AAA no bruit.  No HSM or HJR Distal pulses intact with no bruits No edema Neuro non-focal Skin warm and dry No muscular weakness RFA/RFV cath sight A    Labs    Chemistry Recent Labs  Lab 10/15/17 1843 10/16/17 0428 10/17/17 0516 10/18/17 0601 10/19/17 0357 10/21/17 0458  NA 137 140 138 134* 136 135  K 3.1* 3.7 3.7 3.8 3.8 4.6  CL 104 106 105 102 103 103  CO2 26 26 25 22 26 26   GLUCOSE 162* 283* 195* 200* 166* 319*  BUN 10 15 15  22* 22* 16  CREATININE 0.96 1.13* 1.01* 1.17* 1.06* 0.96  CALCIUM 7.9* 8.1* 8.4* 8.7* 8.5* 8.6*  PROT 5.0* 4.7* 4.9*  --   --   --   ALBUMIN 2.4* 2.1* 2.2*  --   --   --   AST 71* 62* 58*  --   --   --   ALT 39 33 34  --   --   --   ALKPHOS 146* 119 116  --   --   --   BILITOT 2.3* 2.4* 2.2*  --   --   --   GFRNONAA >60 51* 58* 49* 55* >60  GFRAA >60 59* >60 56* >60 >60  ANIONGAP 7 8 8 10 7 6      Hematology Recent Labs  Lab 10/19/17 0357 10/20/17 0956 10/21/17 0458  WBC 7.6 6.5 5.7  RBC 4.05 4.27 4.37  HGB 8.9* 9.4* 9.8*  HCT 29.9* 31.6* 33.1*  MCV 73.8* 74.0* 75.7*  MCH 22.0* 22.0* 22.4*  MCHC 29.8* 29.7* 29.6*  RDW 18.6* 18.7* 19.2*  PLT 99* 99* 114*    Cardiac Enzymes Recent Labs  Lab 10/15/17 1843 10/15/17 2318 10/16/17 0428  TROPONINI <0.03 <0.03 <0.03    Recent Labs  Lab 10/15/17 1313  TROPIPOC 0.00     BNP Recent Labs  Lab 10/15/17 1304 10/15/17 1843  BNP 125.3* 146.1*     DDimer No results for  input(s): DDIMER in the last 168 hours.   Radiology    No results found.  Cardiac Studies   Echo  Study Conclusions  - Left ventricle: The cavity size was normal. Systolic function was   normal. The estimated ejection fraction was in the range of 60%   to 65%. Wall motion was normal; there were no regional wall   motion abnormalities. Mitral stenosis limits evaluation of LV   diastolic function. Doppler parameters are consistent with   elevated mean left atrial filling pressure. - Aortic valve: Valve mobility was restricted. There was moderate   stenosis. Valve area (VTI): 1.05 cm^2. - Mitral valve: Calcified annulus. Mild diffuse thickening,   consistent with rheumatic disease. Mobility was restricted.   Transvalvular velocity was increased. The findings are consistent   with mild to moderate stenosis. There was moderate regurgitation   directed centrally. Mean gradient (D): 15 mm Hg. Gradients   recorded at 88 bpm. Planimetered valve area: 1.53 cm^2. Valve   area by pressure half-time: 1.49 cm^2. Valve area by continuity   equation (using LVOT flow): 1.26 cm^2. - Left atrium: The atrium was mildly dilated.  Patient Profile     66 y.o. female previous CABG 2012 LIMA to LAD, SVG to OM with DES to distal anastomosis SVG OM 2013 and distal LAD Admitted with CHF, dyspnea and bronchitic cough Found to have progressive valve disease by echo with AS/MS.  Improved with diuresis   Assessment & Plan    1) AS/MS:  Discussed with Dr Servando Snare who will see patient today. She has moderate to severe AS peak velocity 36m/xec and moderate to severe MS. Cors by cath ok with patent lima no disease in RCA and patent stents in circumflex. Only real option would be redo with AVR/MVR. Patient can have this done electively if needed. Would wonder if new SVG to OM in order since she has 50% blockage in mid vessel circumflex between stents.   For questions or updates, please contact [place] Please  consult [email or web address] for contact info  under Cardiology/STEMI.      Signed, Jenkins Rouge, MD  10/21/2017, 8:25 AM

## 2017-10-22 ENCOUNTER — Inpatient Hospital Stay (HOSPITAL_COMMUNITY): Payer: Medicare Other

## 2017-10-22 ENCOUNTER — Encounter (HOSPITAL_COMMUNITY): Payer: Self-pay | Admitting: Cardiology

## 2017-10-22 DIAGNOSIS — Z01810 Encounter for preprocedural cardiovascular examination: Secondary | ICD-10-CM

## 2017-10-22 LAB — CBC
HCT: 30.6 % — ABNORMAL LOW (ref 36.0–46.0)
Hemoglobin: 9.2 g/dL — ABNORMAL LOW (ref 12.0–15.0)
MCH: 22.2 pg — ABNORMAL LOW (ref 26.0–34.0)
MCHC: 30.1 g/dL (ref 30.0–36.0)
MCV: 73.9 fL — ABNORMAL LOW (ref 78.0–100.0)
Platelets: 89 10*3/uL — ABNORMAL LOW (ref 150–400)
RBC: 4.14 MIL/uL (ref 3.87–5.11)
RDW: 18.5 % — ABNORMAL HIGH (ref 11.5–15.5)
WBC: 6 10*3/uL (ref 4.0–10.5)

## 2017-10-22 LAB — GLUCOSE, CAPILLARY
GLUCOSE-CAPILLARY: 124 mg/dL — AB (ref 65–99)
Glucose-Capillary: 254 mg/dL — ABNORMAL HIGH (ref 65–99)
Glucose-Capillary: 247 mg/dL — ABNORMAL HIGH (ref 65–99)

## 2017-10-22 LAB — BASIC METABOLIC PANEL
Anion gap: 3 — ABNORMAL LOW (ref 5–15)
BUN: 14 mg/dL (ref 6–20)
CO2: 26 mmol/L (ref 22–32)
Calcium: 8.5 mg/dL — ABNORMAL LOW (ref 8.9–10.3)
Chloride: 106 mmol/L (ref 101–111)
Creatinine, Ser: 0.93 mg/dL (ref 0.44–1.00)
GFR calc Af Amer: >60 mL/min (ref >60)
GFR calc non Af Amer: >60 mL/min (ref >60)
Glucose, Bld: 195 mg/dL — ABNORMAL HIGH (ref 65–99)
Potassium: 4.9 mmol/L (ref 3.5–5.1)
Sodium: 135 mmol/L (ref 135–145)

## 2017-10-22 MED ORDER — FERROUS SULFATE 325 (65 FE) MG PO TABS: 325.0000 mg | ORAL_TABLET | Freq: Every day | ORAL | 1 refills | Status: DC

## 2017-10-22 NOTE — Progress Notes (Signed)
Family Medicine Teaching Service Daily Progress Note Intern Pager: [phone]  Patient name: Denise Cuevas Medical record number: [id number] Date of birth: [birth year] Age: 66 y.o. Gender: female  Primary Care Provider: No primary care provider on file. Consultants:Cardiology Code Status:DNR/DNI (confirmed on admission)  Assessment and Plan: Denise Cuevas a 66 y.o.femalepresenting with 1 month of chest pain and cough. PMH is significant forCAD with CABG in 2012 and stents placed thereafter, hypothyroidism, cirrhosis (presumed NAFLD), HLD, anxiety, aortic stenosis, T2DM and COPD?  Cough/Fluid overload, improving:Likely multifactorial with COPD exacerbation/bronchitis and some aspect of CHF exacerbation. Cough improving, only one episode overnight. Able to sleep flat overnight. Lungs clear to auscultation and no supplemental O2 needed. Fluid balance -13L since admission. - Cardiology following, appreciate recs  - PO lasix 20mg  started 11/13 per cards - Strict I/O  - Daily weight - Dulera BID  - Albuterolas needed - Tylenol with codeine syrup as needed; robitussin-dm - s/p 3 days of Prednisone & 5 days of Levaquin 500mg   Aortic Stenosis/mitral stenosis: Moderate-severe per TEE on 11/12. Admits to recent generalized fatigue.  -Cardiology spoke with Dr. Servando Snare yesterday, her previous cardiac surgeon, who will see the patient today for further valvular surgical evaluation   Chest pain:Resolved.ACS has been ruled out. Pain likely MSK related from coughing. No pain today. -Vitals per floor protocol -Continue asa 81 mg, lipitor 40 mg -Tylenol prn for pain   Hypotension, improving: Continues to have low normal systolic, however improving to 893-810 systolic and low diastolics into 17-51'W. Asymptomatic.  - Hold home cozaar - Spironolactone to 12.5 mg instead of 25 mg daily - Metoprolol restarted 11/12 per cards 12.5mg  - Lasix as above, will need to be cautious -  Continue to monitor closely  T2DM: A1c 8.4 10/15/17.Onnovolog 70/30 mix 40U before breakfast and dinner, jardiance 25 mg daily. Glucose ranges from low 100's-200's recently.  - sensitive SSI - CBGs qACHS -novologmix 40u BID  AKI, improving:Cr 0.96.baselineSCr ~ 0.7/0.8. - Attempt to avoid nephrotoxic agents if possible  Iron deficiency anemia/thrombocytopenia:stable; hgb of 9.8>9.2 and plt 114>89 today (ranged from upper 70 to 110's this admission). Denies hemoptysis or dark stools. Low iron at 20, ferritin 17. Likely sequela of liver cirrhosis. - Ferrous sulfate 325mg  daily  - Continue to monitor daily CBC - Miralax to avoid constipation with iron supplementation  Cirrhosis: Suspected NAFLD per medical record. Thrombocytopenia near baseline. Has been seen by [facility] in the past. [place] screening negative; splenomegaly noted on [place] 10/09/17. On spironolactone 25 mg daily at home. - Spironolactone to 12.5 mg as above for hypotension  FEN/GI:Carb modified/heart healthy dietafterwards; protonix Prophylaxis:SCDs (thrombocytopenia)  Disposition:Home pending cards recs     Subjective:  No acute events overnight. Slept flat last night without concern. Few coughing episodes, but manageable and improved. Denies any chest pain, SOB, abdominal pain, or nausea.   Objective: Temp:  [97.9 F (36.6 C)-98.2 F (36.8 C)] 97.9 F (36.6 C) (11/14 0551) Pulse Rate:  [63-84] 63 (11/14 0758) Resp:  [16-18] 16 (11/14 0758) BP: (104-120)/(45-68) 104/45 (11/14 0551) SpO2:  [96 %-100 %] 96 % (11/14 0758) Weight:  [164 lb 6.4 oz (74.6 kg)] 164 lb 6.4 oz (74.6 kg) (11/14 0551) Physical Exam: General:Older, pleasant female in NAD Cardiovascular:RRR, 3+ systolic murmur, S1, C5,8+NI, PT pulses. Respiratory:CTAB, no wheezes/crackles  Gastrointestinal:+BS, soft,non-tender, non-distended. MSK:1+ pitting edema from shins down. Derm:Noerythema across anterior shins. No increased  warmth or tenderness. Psych:Normal mood and affect    Laboratory: Recent Labs  Lab 10/20/17  1607 10/21/17 0458 10/22/17 0516  WBC 6.5 5.7 6.0  HGB 9.4* 9.8* 9.2*  HCT 31.6* 33.1* 30.6*  PLT 99* 114* 89*   Recent Labs  Lab 10/15/17 1843 10/16/17 0428 10/17/17 0516  10/19/17 0357 10/21/17 0458 10/22/17 0516  NA 137 140 138   < > 136 135 135  K 3.1* 3.7 3.7   < > 3.8 4.6 4.9  CL 104 106 105   < > 103 103 106  CO2 26 26 25    < > 26 26 26   BUN 10 15 15    < > 22* 16 14  CREATININE 0.96 1.13* 1.01*   < > 1.06* 0.96 0.93  CALCIUM 7.9* 8.1* 8.4*   < > 8.5* 8.6* 8.5*  PROT 5.0* 4.7* 4.9*  --   --   --   --   BILITOT 2.3* 2.4* 2.2*  --   --   --   --   ALKPHOS 146* 119 116  --   --   --   --   ALT 39 33 34  --   --   --   --   AST 71* 62* 58*  --   --   --   --   GLUCOSE 162* 283* 195*   < > 166* 319* 195*   < > = values in this interval not displayed.    Imaging/Diagnostic Tests:   Nickola Major, Medical Student 10/22/2017, 8:39 AM [place] Intern pager: [phone], text pages welcome  I examined the patient independently and discussed the assessment and plan with the medical student and I agree with her documentation. I have included my edits as necessary.   Daniel L. Rosalyn Gess, [place] Resident PGY-2 10/22/2017 4:20 PM

## 2017-10-22 NOTE — Progress Notes (Signed)
Inpatient Diabetes Program Recommendations  AACE/ADA: New Consensus Statement on Inpatient Glycemic Control (2015)  Target Ranges:  Prepandial:   less than 140 mg/dL      Peak postprandial:   less than 180 mg/dL (1-2 hours)      Critically ill patients:  140 - 180 mg/dL   Lab Results  Component Value Date   GLUCAP 254 (H) 10/22/2017   HGBA1C 8.4 (H) 10/15/2017    Review of Glycemic ControlResults for Denise Cuevas, Denise Cuevas (MRN [id number]) as of 10/22/2017 12:13  Ref. Range 10/21/2017 12:36 10/21/2017 16:36 10/21/2017 23:06 10/22/2017 07:54 10/22/2017 11:20  Glucose-Capillary Latest Ref Range: 65 - 99 mg/dL 241 (H) 299 (H) 154 (H) 124 (H) 254 (H)   Diabetes history: Type 2 DM Outpatient Diabetes medications: Humalog 75/25 60 units bid, Jardiance 25 mg daily Current orders for Inpatient glycemic control:  Novolog 70/30 mix 40 units bid Novolog sensitive tid with meals Inpatient Diabetes Program Recommendations:   Please consider increasing Novolog 70/30 to 48 units bid.   Thanks, Adah Perl, RN, BC-ADM Inpatient Diabetes Coordinator Pager [phone] (8a-5p)

## 2017-10-22 NOTE — Progress Notes (Addendum)
Prelim. Carotid duplex: 1-39% ICA stenosis. UE Doppler: Bilat decreases >50% with radial compression, normal with ulnar compression.

## 2017-10-22 NOTE — Progress Notes (Signed)
Pt being discharged home via wheelchair with family. Pt alert and oriented x4. VSS. Pt c/o no pain at this time. No signs of respiratory distress. Education complete and care plans resolved. IV removed with catheter intact and pt tolerated well. No further issues at this time. Pt to follow up with PCP. Fatima Fedie R, RN 

## 2017-10-22 NOTE — Progress Notes (Signed)
Patient ID: Denise Cuevas, female   DOB: [birth year], 66 y.o.   MRN: [id number]   Progress Note  Patient Name: Denise Cuevas Date of Encounter: 10/22/2017  Primary Cardiologist: [name]  Subjective   Some nausea after eating No chest pain or palpitations Wondering when Dr Servando Snare is going to be by   Inpatient Medications    Scheduled Meds: . aspirin  81 mg Oral Daily  . atorvastatin  40 mg Oral q1800  . DULoxetine  90 mg Oral Q lunch  . ferrous sulfate  325 mg Oral Q breakfast  . furosemide  20 mg Oral Daily  . insulin aspart  0-9 Units Subcutaneous TID WC  . insulin aspart protamine- aspart  40 Units Subcutaneous BID AC  . levothyroxine  50 mcg Oral QAC breakfast  . metoprolol tartrate  12.5 mg Oral BID  . mometasone-formoterol  2 puff Inhalation BID  . pantoprazole  80 mg Oral Daily  . polyethylene glycol  17 g Oral Daily  . sodium chloride flush  3 mL Intravenous Q12H  . sodium chloride flush  3 mL Intravenous Q12H  . spironolactone  12.5 mg Oral Daily   Continuous Infusions: . sodium chloride    . sodium chloride     PRN Meds: sodium chloride, sodium chloride, acetaminophen, acetaminophen-codeine, albuterol, nitroGLYCERIN, ondansetron (ZOFRAN) IV, sodium chloride flush   Vital Signs    Vitals:   10/21/17 2057 10/21/17 2234 10/22/17 0551 10/22/17 0758  BP:  (!) 110/54 (!) 104/45   Pulse:  74 76 63  Resp:  18 18 16   Temp:  98.2 F (36.8 C) 97.9 F (36.6 C)   TempSrc:  Oral Oral   SpO2: 100% 100% 100% 96%  Weight:   164 lb 6.4 oz (74.6 kg)   Height:        Intake/Output Summary (Last 24 hours) at 10/22/2017 0935 Last data filed at 10/22/2017 0851 Gross per 24 hour  Intake 480 ml  Output 1400 ml  Net -920 ml   Filed Weights   10/20/17 1311 10/21/17 0441 10/22/17 0551  Weight: 162 lb (73.5 kg) 164 lb 6.4 oz (74.6 kg) 164 lb 6.4 oz (74.6 kg)    Telemetry    NSR 10/22/2017  - Personally Reviewed  ECG    NSR no acute ST changes  Poor R wave  progression  - Personally Reviewed  Physical Exam   BP (!) 104/45 (BP Location: Right Arm)   Pulse 63   Temp 97.9 F (36.6 C) (Oral)   Resp 16   Ht 5\' 2"  (1.575 m)   Wt 164 lb 6.4 oz (74.6 kg)   LMP  (LMP Unknown)   SpO2 96%   BMI 30.07 kg/m  Affect appropriate Healthy:  appears stated age HEENT: normal Neck supple with no adenopathy JVP normal no bruits no thyromegaly Lungs clear with no wheezing and good diaphragmatic motion Heart:  S1/S2 AS  murmur, no rub, gallop or click PMI normal Abdomen: benighn, BS positve, no tenderness, no AAA no bruit.  No HSM or HJR Distal pulses intact with no bruits No edema Neuro non-focal Skin warm and dry No muscular weakness RFA/RFV cath sight A    Labs    Chemistry Recent Labs  Lab 10/15/17 1843 10/16/17 0428 10/17/17 0516  10/19/17 0357 10/21/17 0458 10/22/17 0516  NA 137 140 138   < > 136 135 135  K 3.1* 3.7 3.7   < > 3.8 4.6 4.9  CL 104 106 105   < >  103 103 106  CO2 26 26 25    < > 26 26 26   GLUCOSE 162* 283* 195*   < > 166* 319* 195*  BUN 10 15 15    < > 22* 16 14  CREATININE 0.96 1.13* 1.01*   < > 1.06* 0.96 0.93  CALCIUM 7.9* 8.1* 8.4*   < > 8.5* 8.6* 8.5*  PROT 5.0* 4.7* 4.9*  --   --   --   --   ALBUMIN 2.4* 2.1* 2.2*  --   --   --   --   AST 71* 62* 58*  --   --   --   --   ALT 39 33 34  --   --   --   --   ALKPHOS 146* 119 116  --   --   --   --   BILITOT 2.3* 2.4* 2.2*  --   --   --   --   GFRNONAA >60 51* 58*   < > 55* >60 >60  GFRAA >60 59* >60   < > >60 >60 >60  ANIONGAP 7 8 8    < > 7 6 3*   < > = values in this interval not displayed.     Hematology Recent Labs  Lab 10/20/17 0956 10/21/17 0458 10/22/17 0516  WBC 6.5 5.7 6.0  RBC 4.27 4.37 4.14  HGB 9.4* 9.8* 9.2*  HCT 31.6* 33.1* 30.6*  MCV 74.0* 75.7* 73.9*  MCH 22.0* 22.4* 22.2*  MCHC 29.7* 29.6* 30.1  RDW 18.7* 19.2* 18.5*  PLT 99* 114* 89*    Cardiac Enzymes Recent Labs  Lab 10/15/17 1843 10/15/17 2318 10/16/17 0428  TROPONINI  <0.03 <0.03 <0.03    Recent Labs  Lab 10/15/17 1313  TROPIPOC 0.00     BNP Recent Labs  Lab 10/15/17 1304 10/15/17 1843  BNP 125.3* 146.1*     DDimer No results for input(s): DDIMER in the last 168 hours.   Radiology    No results found.  Cardiac Studies   Echo  Study Conclusions  - Left ventricle: The cavity size was normal. Systolic function was   normal. The estimated ejection fraction was in the range of 60%   to 65%. Wall motion was normal; there were no regional wall   motion abnormalities. Mitral stenosis limits evaluation of LV   diastolic function. Doppler parameters are consistent with   elevated mean left atrial filling pressure. - Aortic valve: Valve mobility was restricted. There was moderate   stenosis. Valve area (VTI): 1.05 cm^2. - Mitral valve: Calcified annulus. Mild diffuse thickening,   consistent with rheumatic disease. Mobility was restricted.   Transvalvular velocity was increased. The findings are consistent   with mild to moderate stenosis. There was moderate regurgitation   directed centrally. Mean gradient (D): 15 mm Hg. Gradients   recorded at 88 bpm. Planimetered valve area: 1.53 cm^2. Valve   area by pressure half-time: 1.49 cm^2. Valve area by continuity   equation (using LVOT flow): 1.26 cm^2. - Left atrium: The atrium was mildly dilated.  Patient Profile     66 y.o. female previous CABG 2012 LIMA to LAD, SVG to OM with DES to distal anastomosis SVG OM 2013 and distal LAD Admitted with CHF, dyspnea and bronchitic cough Found to have progressive valve disease by echo with AS/MS.  Improved with diuresis   Assessment & Plan    1) AS/MS:   She has moderate to severe AS peak velocity 46m/xec and  moderate to severe MS. Cors by cath ok with patent lima no disease in RCA and patent stents in circumflex. Only real option would be redo with AVR/MVR. Patient can have this done electively if needed. Would wonder if new SVG to OM in order since  she has 50% blockage in mid vessel circumflex between stents. She has had a good diuresis and is compensated Have ordered pre cabg dopplers Personally spoke with Dr Servando Snare and he will see today Increase lopressor today if tolerated to maximize diastolic filling time   For questions or updates, please contact [facility] Please consult [email or web address] for contact info under Cardiology/STEMI.      Signed, Jenkins Rouge, MD  10/22/2017, 9:35 AM

## 2017-10-27 ENCOUNTER — Other Ambulatory Visit: Payer: Self-pay

## 2017-10-27 ENCOUNTER — Encounter: Payer: Self-pay | Admitting: Cardiothoracic Surgery

## 2017-10-27 ENCOUNTER — Ambulatory Visit: Payer: Medicare Other | Admitting: Cardiothoracic Surgery

## 2017-10-27 VITALS — BP 118/65 | HR 84 | Ht 62.0 in | Wt 161.0 lb

## 2017-10-27 DIAGNOSIS — I350 Nonrheumatic aortic (valve) stenosis: Secondary | ICD-10-CM | POA: Diagnosis not present

## 2017-10-27 DIAGNOSIS — I050 Rheumatic mitral stenosis: Secondary | ICD-10-CM | POA: Diagnosis not present

## 2017-10-27 NOTE — Progress Notes (Signed)
[address]       [place] [id number]             [phone]        Zyairah E Wilmer Croom Medical Record #[id number] Date of Birth: 1951-08-11  Referring: Dr Johnsie Cancel  Primary Care: Will be a new patient of [name], MD likely December  Chief Complaint:    Chief Complaint  Patient presents with  . Shortness of Breath  . Chest Pain  . Leg Swelling    Previous cardiac surgery: DATE OF PROCEDURE: 11/27/2011   OPERATIVE REPORT  PREOPERATIVE DIAGNOSES: Coronary occlusive disease with recent  subendocardial myocardial infarction.  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  SURGICAL PROCEDURE: Coronary artery bypass grafting x2 with the left  internal mammary to the left anterior descending coronary artery and  reverse saphenous vein graft to the circumflex coronary artery, with  endo-vein harvesting from the right thigh.   HPI:  This is a 66 year old Caucasian female with a past medical history of coronary artery disease (s/p  and CABG x 2 11/2011 and stents placed 2013), diabetes mellitus, non alcoholic cirrhosis, asthma,  who presented on 10/15/2017 with complaints of intermittent non exertional chest pain, worsening shortness of breath, fatigue, and cough. She states she had intermittent dizziness as well.  Regarding her cough, she went to [facility] who told her she had acute bronchitis and COPD. She was given a course of Azithromycin, without much improvement in her symptoms.   She then presented to her cardiologist's office on 10/15/2017 . She presented with complaints of "something is wrong but unsure of what".  Specifically, she had had intermittent chest pain (both with exertion and at rest) and was sleepy and tired. In addition, she had complaints of leg redness which she used hydrocortisone cream with some improvement. She was admitted to [facility] for  further evaluation and treatment. EKG apparently showed no acute ST changes.  Initial and subsequent Troponin I was less than 0.03. Echo done 10/16/2017 showed LVEF 60-65%,  moderate AS, moderate MR, and moderate to severe MS. A cardiac catheterization was done on 10/16/2017 and showed previously placed Ost Cx to Prox Cx stent (unknown type) is widely patent, previously placed 2nd Mrg stent (unknown type) is widely patent, Ost 2nd Mrg lesion is 50% stenosed, prox LAD lesion is 100% stenosed, previously placed Mid LAD to Dist LAD stent (unknown type) is widely patent, moderate aortic valve stenosis, and moderate mitral valve stenosis. A cardiothoracic consultation was requested , the patient stabilized and was discharged home to be seen in the office .  She comes into the office today feels much better than when she was in the hospital , she continues on diuretic therapy and notes she has lost approximately 15 pounds .   current Activity/ Functional Status: Patient was independent with mobility/ambulation, transfers, ADL's, IADL's.   Zubrod Score: At the time of surgery this patient's most appropriate activity status/level should be described as: []     0    Normal activity, no symptoms []     1    Restricted in physical strenuous activity but ambulatory, able to do out light work [x]     2    Ambulatory and capable of self care, unable to do work activities, up and about more than 50% of the time                            []   3    Only limited self care, in bed greater than 50% of waking hours []     4    Completely disabled, no self care, confined to bed or chair []     5    Moribund  Past Medical History:  Diagnosis Date  . Angina   . Asthma   . CHF (congestive heart failure) ([place])   . 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 Coronary artery disease    s/p CABG  . Depression   . Diabetes mellitus    type 2  . Dyspnea   . Edema extremities    consistent edema lower legs, feet, and  right quadrant abdominal pain-"goes and comes"  . Family history of adverse reaction to anesthesia    SISTER HAS NAUSEA  . Fibromyalgia   . GERD (gastroesophageal reflux disease)   . H/O hiatal hernia   . Headache(784.0)    occ. generalized  . Heart murmur   . Hypercholesteremia   . Hypertension   . Hypothyroid   . Myocardial infarction [facility]) 11/2011   MI x2- 2'54,2'70 coronary stent(chest pain episode at time)  . PONV (postoperative nausea and vomiting)     Past Surgical History:  Procedure Laterality Date  . CHOLECYSTECTOMY     open many yrs ago  . CORONARY ANGIOPLASTY WITH STENT PLACEMENT  05/06/2012  . KNEE SURGERY Right    scope surgery    Social History   Socioeconomic History  . Marital status: Married    Spouse name: Not on file  . Number of children: Not on file  . Years of education: Not on file  . Highest education level: Not on file  Occupational History  . Occupation: Retired Biomedical scientist, Environmental consultant principal  Tobacco Use  . Smoking status: Never Smoker  . Smokeless tobacco: Never Used  Substance and Sexual Activity  . Alcohol use: No  . Drug use: No  . Sexual activity: Yes    Allergies  Allergen Reactions  . Exenatide Nausea And Vomiting  . Ace Inhibitors Other (See Comments)    pseudoasthma    Current Facility-Administered Medications  Medication Dose Route Frequency Provider Last Rate Last Dose  . [MAR Hold] 0.9 %  sodium chloride infusion  250 mL Intravenous PRN Truitt Merle C, NP      . 0.9 %  sodium chloride infusion   Intravenous Continuous Burnell Blanks, MD 50 mL/hr at 10/20/17 1642    . [MAR Hold] acetaminophen (TYLENOL) tablet 650 mg  650 mg Oral Q4H PRN Burtis Junes, NP      . Doug Sou Hold] acetaminophen-codeine (TYLENOL #3) 300-30 MG per tablet 1 tablet  1 tablet Oral Q6H PRN Everrett Coombe, MD   1 tablet at 10/17/17 2117  . [MAR Hold] albuterol (PROVENTIL) (2.5 MG/3ML) 0.083% nebulizer solution 2.5 mg  2.5 mg  Nebulization Q4H PRN Truitt Merle C, NP   2.5 mg at 10/18/17 0819  . [MAR Hold] aspirin chewable tablet 81 mg  81 mg Oral Daily Andrena Mews T, MD      . Doug Sou Hold] atorvastatin (LIPITOR) tablet 40 mg  40 mg Oral q1800 Rogue Bussing, MD   40 mg at 10/19/17 1645  . [MAR Hold] DULoxetine (CYMBALTA) DR capsule 90 mg  90 mg Oral Q lunch Olene Floss Durand, MD   90 mg at 10/19/17 1212  . [MAR Hold] ferrous sulfate tablet 325 mg  325 mg Oral Q breakfast Kinnie Feil, MD  325 mg at 10/19/17 0821  . [MAR Hold] furosemide (LASIX) tablet 20 mg  20 mg Oral Daily [name], MD      . Doug Sou Hold] insulin aspart (novoLOG) injection 0-9 Units  0-9 Units Subcutaneous TID WC Kinnie Feil, MD   2 Units at 10/20/17 1228  . [MAR Hold] insulin aspart protamine- aspart (NOVOLOG MIX 70/30) injection 40 Units  40 Units Subcutaneous BID AC Rogue Bussing, MD   40 Units at 10/19/17 1644  . [MAR Hold] levothyroxine (SYNTHROID, LEVOTHROID) tablet 50 mcg  50 mcg Oral QAC breakfast Andrena Mews T, MD   50 mcg at 10/19/17 0641  . [MAR Hold] metoprolol tartrate (LOPRESSOR) tablet 12.5 mg  12.5 mg Oral BID [name], MD      . Doug Sou Hold] mometasone-formoterol [facility]) 100-5 MCG/ACT inhaler 2 puff  2 puff Inhalation BID Rogue Bussing, MD   2 puff at 10/20/17 [phone]  . [MAR Hold] nitroGLYCERIN (NITROSTAT) SL tablet 0.4 mg  0.4 mg Sublingual Q5 Min x 3 PRN Burtis Junes, NP      . Doug Sou Hold] ondansetron (ZOFRAN) injection 4 mg  4 mg Intravenous Q6H PRN Burtis Junes, NP      . Doug Sou Hold] pantoprazole (PROTONIX) EC tablet 80 mg  80 mg Oral Daily Rogue Bussing, MD   80 mg at 10/20/17 1006  . [MAR Hold] polyethylene glycol (MIRALAX / GLYCOLAX) packet 17 g  17 g Oral Daily Carlyle Dolly, MD   17 g at 10/19/17 0931  . [MAR Hold] sodium chloride flush (NS) 0.9 % injection 3 mL  3 mL Intravenous Q12H Truitt Merle C, NP   3 mL at 10/19/17 2203  . [MAR  Hold] spironolactone (ALDACTONE) tablet 12.5 mg  12.5 mg Oral Daily Carlyle Dolly, MD   12.5 mg at 10/20/17 1008    Medications Prior to Admission  Medication Sig Dispense Refill Last Dose  . albuterol (PROVENTIL HFA;VENTOLIN HFA) 108 (90 BASE) MCG/ACT inhaler Inhale 2 puffs into the lungs every 6 (six) hours as needed for wheezing or shortness of breath. 1 Inhaler 2 10/15/2017 at Unknown time  . albuterol (PROVENTIL) (2.5 MG/3ML) 0.083% nebulizer solution Take 3 mLs (2.5 mg total) by nebulization every 2 (two) hours as needed for wheezing. 75 mL 0 10/14/2017  . aspirin 81 MG tablet Take 81 mg by mouth daily.   10/14/2017  . atorvastatin (LIPITOR) 40 MG tablet Take 40 mg by mouth daily.   10/14/2017  . budesonide-formoterol (SYMBICORT) 80-4.5 MCG/ACT inhaler Inhale 2 puffs into the lungs 2 (two) times daily.   10/14/2017  . DULoxetine (CYMBALTA) 30 MG capsule Take 90 mg by mouth daily with lunch. In conjunction with one 60 mg capsule to equal a total of 90 milligrams   10/14/2017  . DULoxetine (CYMBALTA) 60 MG capsule Take 90 mg by mouth daily with lunch. In conjunction with one 30 mg capsule to equal a total of 90 milligrams   10/14/2017  . furosemide (LASIX) 40 MG tablet Take 1 tablet (40 mg total) by mouth daily. 30 tablet 0 10/14/2017  . HUMALOG MIX 75/25 KWIKPEN (75-25) 100 UNIT/ML Kwikpen INJECT 60 UNITS SUBCUTANEOUSLY BEFORE BREAKFAST AND 60 UNITS BEFORE EVENING MEAL  10 10/15/2017  . JARDIANCE 25 MG TABS tablet Take 25 mg daily by mouth.  5 10/14/2017  . levothyroxine (SYNTHROID, LEVOTHROID) 50 MCG tablet Take 50 mcg by mouth daily before breakfast.   10/14/2017  . metoprolol tartrate (LOPRESSOR)  25 MG tablet Take 12.5 mg by mouth 2 (two) times daily.    10/14/2017  . nitroGLYCERIN (NITROSTAT) 0.4 MG SL tablet Place 0.4 mg under the tongue every 5 (five) minutes as needed. For chest pain    at prn  . omeprazole-sodium bicarbonate (ZEGERID) 40-1100 MG per capsule Take 1 capsule by mouth daily  before breakfast.   10/15/2017 at Unknown time  . spironolactone (ALDACTONE) 25 MG tablet Take 1 tablet (25 mg total) by mouth daily. 30 tablet 0 10/14/2017  . hydrocortisone cream 1 % Apply topically 2 (two) times daily. Apply to right lower leg (Patient taking differently: Apply 1 application topically 2 (two) times daily as needed for itching. Apply to right lower leg) 30 g 0 Taking    Family History  Problem Relation Age of Onset  . Heart disease Father 63       died of MI  . Emphysema Father        smoked  . Peripheral vascular disease Mother 26       bilaterial amputations   Review of Systems:     Cardiac Review of Systems: Y or N  Chest Pain [  Y  ] Exertional SOB  [Y  ]  Orthopnea [ N ]   Pedal Edema [  Y ]    Palpitations [ N ] Syncope  [ N ]  Presyncope [ N  ]  General Review of Systems: [Y] = yes [ N ]=no Constitional:  fatigue [ Y ]; nausea [ N ]; night sweats Aqua.Slicker  ]; fever Aqua.Slicker  ]; or chills [ N ]                                                               Dental: poor dentition (Patient has not seen a dentist in over a year. She had an appointment but had to cancel it because too tired)  Eye : diplopia [ N  ]; Amaurosis fugax[ N ]; Resp: cough [ Y ];  wheezing[ N ];  hemoptysis[ N ];   GI:  vomiting[ N];  dysphagia[N  ]; melena[N  ];  hematochezia Aqua.Slicker  ];  GU:  hematuria[ N ];   dysuria [ N ];  nocturia[ N ];               Skin: rash, swelling[ Y ];, or redness [ Y ];  Heme/Lymph: bruising[ Y ];  anemia[ Y ];              Neuro: TIA[ N ];   stroke[N  ];  vertigo[N  ];  seizures[N  ];   Psych:depression[ Y ]; anxiety[ Y ];  Endocrine: diabetes[ Y ];  thyroid dysfunction[Y  ];    Physical Exam: BP (!) 121/41   Pulse 90   Temp 98.3 F (36.8 C) (Oral)   Resp 16   Ht 5\' 2"  (1.575 m)   Wt 162 lb (73.5 kg)   LMP  (LMP Unknown)   SpO2 96%   BMI 29.63 kg/m    General appearance: alert, cooperative and appears older than stated age Head: Normocephalic, without obvious  abnormality, atraumatic Neck: no JVD, supple, symmetrical, trachea midline and AS murmur radiates to both carotid arteries as well as murmur over apex Cardio:  RRR, grade III/VI murmur heart best along sternal border radiating to carotid arteries  GI: Soft, non tender, bowel sounds present Extremities: Palpable DP/PT bilaterally, + pre tibial pitting edema LE Neurologic: Grossly normal  Diagnostic Studies & Laboratory data:     Recent Radiology Findings:  Dg Chest 2 View  Result Date: 10/15/2017 CLINICAL DATA:  Chest pain, shortness of breath and cough for 4 weeks. EXAM: CHEST  2 VIEW COMPARISON:  10/12/2017 and prior radiographs FINDINGS: Cardiomegaly and CABG changes again noted. There is no evidence of focal airspace disease, pulmonary edema, suspicious pulmonary nodule/mass, pleural effusion, or pneumothorax. No acute bony abnormalities are identified. IMPRESSION: Cardiomegaly without evidence of acute cardiopulmonary disease. Electronically Signed   By: Margarette Canada M.D.   On: 10/15/2017 13:58   US Abdomen Complete  Result Date: 10/10/2017 CLINICAL DATA:  66 year old female with cirrhosis. Hepatocellular carcinoma screening. History of cholecystectomy. EXAM: ABDOMEN ULTRASOUND COMPLETE COMPARISON:  03/31/2017 and prior exams FINDINGS: Gallbladder: The gallbladder is not visualized compatible with cholecystectomy. Common bile duct: Diameter: 7 mm. No intrahepatic or extrahepatic biliary dilatation. Liver: Nodular contour and coarse hepatic echotexture compatible with cirrhosis. No focal hepatic masses are identified. Portal vein is patent on color Doppler imaging with normal direction of blood flow towards the liver. IVC: No abnormality visualized. Pancreas: Visualized portion unremarkable. Spleen: Splenomegaly again noted with a splenic volume of 715 cc Right Kidney: Length: 11.4 cm. Echogenicity within normal limits. No mass or hydronephrosis visualized. Left Kidney: Length: 11.7 cm. Echogenicity  within normal limits. No mass or hydronephrosis visualized. Abdominal aorta: No aneurysm visualized. Other findings: None.  No ascites noted. IMPRESSION: 1. Cirrhosis without evidence of focal mass. 2. Splenomegaly again identified. 3. No other significant abnormalities. Electronically Signed   By: Margarette Canada M.D.   On: 10/10/2017 10:26   I have independently reviewed the above radiologic studies.  Recent Lab Findings: Lab Results  Component Value Date   WBC 6.5 10/20/2017   HGB 9.4 (L) 10/20/2017   HCT 31.6 (L) 10/20/2017   PLT 99 (L) 10/20/2017   GLUCOSE 166 (H) 10/19/2017   CHOL 121 10/18/2017   TRIG 61 10/18/2017   HDL 33 (L) 10/18/2017   LDLCALC 76 10/18/2017   ALT 34 10/17/2017   AST 58 (H) 10/17/2017   NA 136 10/19/2017   K 3.8 10/19/2017   CL 103 10/19/2017   CREATININE 1.06 (H) 10/19/2017   BUN 22 (H) 10/19/2017   CO2 26 10/19/2017   TSH 2.664 10/15/2017   INR 1.31 10/15/2017   HGBA1C 8.4 (H) 10/15/2017   Imaging Studies:  RIGHT/LEFT HEART CATH AND CORONARY/GRAFT ANGIOGRAPHY by Dr. Angelena Form on 10/20/2017:  Conclusion     Prox RCA to Mid RCA lesion is 20% stenosed.  Previously placed Ost Cx to Prox Cx stent (unknown type) is widely patent.  Previously placed 2nd Mrg stent (unknown type) is widely patent.  Ost 2nd Mrg lesion is 50% stenosed.  Prox LAD lesion is 100% stenosed.  SVG graft was not visualized.  LIMA graft was visualized by angiography and is normal in caliber.  The graft exhibits no disease.  Previously placed Mid LAD to Dist LAD stent (unknown type) is widely patent.  Ost LAD to Prox LAD lesion is 20% stenosed.  There is moderate aortic valve stenosis.  There is moderate mitral valve stenosis.  Hemodynamic findings consistent with moderate pulmonary hypertension.  LV end diastolic pressure is normal.   1. Double vessel CAD s/p 2V CABG with 1/2 patent bypass  grafts 2. The LAD courses to the apex. The mid LAD is occluded after a  moderate caliber diagonal branch. The mid and distal LAD fills from the patent LIMA graft. The stent in the distal LAD is patent without restenosis.  3. The Circumflex artery is patent. There are patent stents in the proximal Circumflex and distal segment of the obtuse marginal branch. The proximal segment of the OM branch prior to the stent has a moderate non-obstructive stenosis. The vein graft to the OM branch is known to be occluded and was not selectively engaged today.  4. The RCA is a small to moderate caliber co-dominant vessel with mild non-obstructive plaque.  5. Moderate Aortic stenosis (peak to peak gradient 23 mmHg, mean gradient 20 mmHg, AVA 2.2 cm2) 6. Moderately severe mitral stenosis (peak to peak gradient 14 mmHg, mean gradient 12.5 mmHg, MVA 3.32cm2)  Recommendations: Medical management of CAD. Close follow up of her valve disease. Consider surgical consultation.     Hemo Data    Most Recent Value  Fick Cardiac Output 10.71 L/min  Fick Cardiac Output Index 6.12 (L/min)/BSA  Aortic Mean Gradient 20 mmHg  Aortic Peak Gradient 23 mmHg  Aortic Valve Area 2.20  Aortic Value Area Index 1.26 cm2/BSA  RA A Wave 12 mmHg  RA V Wave 10 mmHg  RA Mean 7 mmHg  RV Systolic Pressure 55 mmHg  RV Diastolic Pressure 0 mmHg  RV EDP 13 mmHg  PA Systolic Pressure 59 mmHg  PA Diastolic Pressure 20 mmHg  PA Mean 40 mmHg  PW A Wave 27 mmHg  PW V Wave 45 mmHg  PW Mean 25 mmHg  AO Systolic Pressure 509 mmHg  AO Diastolic Pressure 53 mmHg  AO Mean 76 mmHg  LV Systolic Pressure 326 mmHg  LV Diastolic Pressure -10 mmHg  LV EDP 0 mmHg  Arterial Occlusion Pressure Extended Systolic Pressure 712 mmHg  Arterial Occlusion Pressure Extended Diastolic Pressure 45 mmHg  Arterial Occlusion Pressure Extended Mean Pressure 79 mmHg  Left Ventricular Apex Extended Systolic Pressure 458 mmHg  Left Ventricular Apex Extended Diastolic Pressure 0 mmHg  Left Ventricular Apex Extended EDP Pressure 10  mmHg  QP/QS 1  TPVR Index 6.54 HRUI  TSVR Index 12.43 HRUI  PVR SVR Ratio 0.17  TPVR/TSVR Ratio 0.53   ECHO:TEE 10/20/2017 Transesophageal Echocardiography  Patient:    Brihana, Quickel MR #:       [id number] Study Date: 10/20/2017 Gender:     F Age:        66 Height:     157.5 cm Weight:     73.5 kg BSA:        1.82 m^2 Pt. Status: Room:       [facility] T  ORDERING     Ena Dawley, M.D.  PERFORMING   Ena Dawley, M.D.  REFERRING    Ena Dawley, M.D.  [place] [id number]  SONOGRAPHER  Mikki Santee  cc:  ------------------------------------------------------------------- LV EF: 60% -   65%  ------------------------------------------------------------------- Indications:      Aortic stenosis 424.1.  ------------------------------------------------------------------- History:   PMH:   Murmur.  Coronary artery disease.  Congestive heart failure.  Mitral valve disease.  Risk factors:  Diabetes mellitus.  ------------------------------------------------------------------- Study Conclusions  - Left ventricle: Systolic function was normal. The estimated   ejection fraction was in the range of 60% to 65%. Wall motion was   normal; there were no regional wall  motion abnormalities. - Aortic valve: Cusp separation was moderately reduced. There was   moderate stenosis. There was mild regurgitation. - Mitral valve: Severely thickened, mildly calcified leaflets .   Leaflet separation was moderately reduced. The findings are   consistent with moderate to severe stenosis. There was moderate   regurgitation. Mean gradient (D): 10 mm Hg. - Left atrium: The atrium was dilated. No evidence of thrombus in   the atrial cavity or appendage. No evidence of thrombus in the   atrial cavity or appendage. - Right atrium: No evidence of thrombus in the atrial cavity or   appendage.  Impressions:  - Aortic valve is  severely calcified predominantly in the   non-coronary leaflet, leaflet opening is moderately decreased but   still opening. Peak/mean transaortic gradients were 64/33 mmHg.   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 There is at most   moderate mitral regurgitation but moderate to severe mitral   stenosis with mean transmitral gradient 10 mmHg.  ------------------------------------------------------------------- Study data:   Study status:  Routine.  Consent:  The risks, benefits, and alternatives to the procedure were explained to the patient and informed consent was obtained.  Procedure:  The patient reported no pain pre or post test. Initial setup. The patient was brought to the laboratory. Surface ECG leads were monitored. Sedation. Conscious sedation was administered by cardiology staff. Transesophageal echocardiography. Topical anesthesia was obtained using viscous lidocaine. A transesophageal probe was inserted by the attending cardiologist. Image quality was adequate.  Study completion:  The patient tolerated the procedure well. There were no complications.  Administered medications:   Fentanyl, 72mcg. Midazolam, 4mg .          Diagnostic transesophageal echocardiography.  2D and color Doppler.  Birthdate:  Patient birthdate: 12-06-1951.  Age:  Patient is 66 yr old.  Sex:  Gender: female.    BMI: 29.6 kg/m^2.  Blood pressure:     110/39  Patient status:  Inpatient.  Study date:  Study date: 10/20/2017. Study time: 02:01 PM.  Location:  Endoscopy.  -------------------------------------------------------------------  ------------------------------------------------------------------- Left ventricle:  Systolic function was normal. The estimated ejection fraction was in the range of 60% to 65%. Wall motion was normal; there were no regional wall motion  abnormalities.  ------------------------------------------------------------------- Aortic valve:   Trileaflet; severely thickened, severely calcified leaflets. Cusp separation was moderately reduced.  Doppler:   There was moderate stenosis.   There was mild regurgitation.    Mean gradient (S): 33 mm Hg. Peak gradient (S): 64 mm Hg.  ------------------------------------------------------------------- Aorta:  There was mild atheroma. There was no evidence for dissection. Aortic root: The aortic root was not dilated. Ascending aorta: The ascending aorta was normal in size. Descending aorta: The descending aorta was normal in size.  ------------------------------------------------------------------- Mitral valve:   Severely thickened, mildly calcified leaflets . Leaflet separation was moderately reduced.  Doppler:   The findings are consistent with moderate to severe stenosis.   There was moderate regurgitation.    Mean gradient (D): 10 mm Hg.  ------------------------------------------------------------------- Left atrium:  The atrium was dilated.  No evidence of thrombus in the atrial cavity or appendage.  No evidence of thrombus in the atrial cavity or appendage. The appendage was morphologically a left appendage, multilobulated, and of normal size. Emptying velocity was normal.  ------------------------------------------------------------------- Right ventricle:  The cavity size was normal. Wall thickness was normal. Systolic function was normal.  ------------------------------------------------------------------- Pulmonic valve:    Structurally normal  valve.  ------------------------------------------------------------------- Tricuspid valve:   Structurally normal valve.   Leaflet separation was normal.  Doppler:  There was mild regurgitation.  ------------------------------------------------------------------- Pulmonary artery:   The main pulmonary artery was  normal-sized.  ------------------------------------------------------------------- Right atrium:  The atrium was normal in size.  No evidence of thrombus in the atrial cavity or appendage. The appendage was morphologically a right appendage.  ------------------------------------------------------------------- Pericardium:  There was no pericardial effusion.  ------------------------------------------------------------------- Measurements   Aortic valve                              Value  Aortic valve peak velocity, S             401   cm/s  Aortic valve mean velocity, S             238   cm/s  Aortic valve VTI, S                       76.5  cm  Aortic mean gradient, S                   28    mm Hg  Aortic peak gradient, S                   64    mm Hg    Mitral valve                              Value  Mitral mean velocity, D                   148   cm/s  Mitral mean gradient, D                   10    mm Hg  Mitral annulus VTI, D                     59.3  cm  Mitral maximal regurg velocity, PISA      535   cm/s  Mitral regurg VTI, PISA                   151   cm  Legend: (L)  and  (H)  mark values outside specified reference range.  ------------------------------------------------------------------- Prepared and Electronically Authenticated by  Ena Dawley, M.D. 2018-11-12T15:05:21  Assessment / plan:   1.  Moderate aortic stenosis (peak to peak gradient 23 mmHg, mean gradient 20 mmHg, AVA 2.2 cm2) 2.  Moderately severe mitral stenosis (peak to peak gradient 14 mmHg, mean gradient 12.5 mmHg, MVA 3.32cm2) 3. Diabetes insulin-dependent. HGA1C done 10/15/2017 8.4. Needs better glucose control. Will need closed follow up with medical doctor after discharge. 4. Hypothyroidism: Recent TSH 2.66 5. History of iron deficiency anemia,thrombocytopenia, and splenomegaly-questionable if this is related to cirrhosis 6. Acute diastolic HF/valvular heart disease.    The  patient has moderate to moderately severe mitral stenosis and moderate to moderately severe aortic stenosis, symptomatic with recent admission for congestive heart failure, she has improved to some degree in her symptoms.  I discussed with her the risks and options of aortic mitral valve replacement.  We discussed use of mechanical versus tissue valves.  She will continue to record her weights and daily basis and I will plan to see her back in 1 week,  in an effort to optimize her condition before we proceed with redo sternotomy and aortic and mitral valve replacement.   [name] MD      [address] [place] [address] Office [phone]   [place]

## 2017-10-29 ENCOUNTER — Ambulatory Visit (INDEPENDENT_AMBULATORY_CARE_PROVIDER_SITE_OTHER): Payer: Medicare Other | Admitting: Internal Medicine

## 2017-10-29 ENCOUNTER — Other Ambulatory Visit: Payer: Self-pay

## 2017-10-29 VITALS — BP 132/68 | HR 72 | Temp 97.9°F | Ht 62.0 in | Wt 166.0 lb

## 2017-10-29 DIAGNOSIS — Z09 Encounter for follow-up examination after completed treatment for conditions other than malignant neoplasm: Secondary | ICD-10-CM | POA: Diagnosis not present

## 2017-10-29 MED ORDER — HYDROCODONE-HOMATROPINE 5-1.5 MG/5ML PO SYRP: 5.0000 mL | ORAL_SOLUTION | Freq: Four times a day (QID) | ORAL | 0 refills | Status: DC | PRN

## 2017-10-29 NOTE — Patient Instructions (Signed)
Ms. Tagliaferro,  Thank you for coming in today. Your lungs sound clear!  Continue to monitor your weights.  If you have trouble with your wand glucometer, please contact our office to meet with our pharmacist Dr. Valentina Lucks.  Be well!  Best, Dr. Ola Spurr

## 2017-10-29 NOTE — Progress Notes (Signed)
[facility] Progress Note  Subjective:  Denise Cuevas is a 66 y.o. 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   Patient was admitted from 11/7 through 10/22/2017 for chest pain ACS r/o and cough thought to be secondary to bronchitis/COPD/HFpEF exacerbation. She was significantly diuresed during hospitalization and was found to have 1/2 patent bypass vessels on heart cath and worsening aortic stenosis and mitral stenosis on ECHO. Cough improved with diuresis and after course of steroids and azithromycin. However, cough is still keeping her up at night and making it difficult for her to sleep. She reports her weight has remained stable between 161 and 162. She is pleased that she can now see definition in her feet, which she says is a good indication for her that she is not overloaded. Patient saw Dr. Servando Snare on 11/19 and is to see him again and her cardiologist next week. The plan is to redo sternotomy with aortic and mitral valve replacement once patient optimized. ROS: No dyspnea, no chest pain, no fevers; does report some ongoing chest congestion  Regarding CBG control, patient reports her wand glucometer stopped working and that she is having to stick her finger again. The last two mornings' fasting CBGs were 120, 102.    Allergies  Allergen Reactions  . Exenatide Nausea And Vomiting  . Ace Inhibitors Other (See Comments)    pseudoasthma    Social History   Tobacco Use  . Smoking status: Never Smoker  . Smokeless tobacco: Never Used  Substance Use Topics  . Alcohol use: No    Objective: Blood pressure 132/68, pulse 72, temperature 97.9 F (36.6 C), temperature source Oral, height 5\' 2"  (1.575 m), weight 166 lb (75.3 kg), SpO2 99 %. Body mass index is 30.36 kg/m. Constitutional: Overweight female in  NAD Cardiovascular: RRR, S1, S2, 3/6 SEM Pulmonary/Chest: Effort normal and breath sounds normal. No wheezing appreciated.  Musculoskeletal: No LE edema Skin: Skin is warm and dry. No rash noted.  Psychiatric: Normal mood and affect.  Vitals reviewed  Assessment/Plan: Hospital discharge follow-up Heart failure with valvular disease - weights stable and no LE edema on exam. Denies SOB.  Cough - patient tried mucinex, tessalon perles without improvement while hospitalized. Gave rx for hycodan. T2DM - patient denies need for medication refills. If her local pharmacist cannot fix her wand glucometer, she may contact Dr. Valentina Lucks for an appointment.   Patient has appropriate follow-up scheduled already for 11/03/17 with Cardiology and [facility]. Cardiology had suggested increasing metoprolol if BP could tolerate it to maximize diastolic filling time, but I will defer medication changes in the setting of upcoming surgery and because patient plans to follow with [facility] for continuing primary care.   Olene Floss, MD [place], PGY-3

## 2017-10-30 ENCOUNTER — Encounter: Payer: Self-pay | Admitting: Internal Medicine

## 2017-10-30 DIAGNOSIS — Z09 Encounter for follow-up examination after completed treatment for conditions other than malignant neoplasm: Secondary | ICD-10-CM | POA: Insufficient documentation

## 2017-10-30 NOTE — Assessment & Plan Note (Addendum)
Heart failure with valvular disease - weights stable and no LE edema on exam. Denies SOB.  Cough - patient tried mucinex, tessalon perles without improvement while hospitalized. Gave rx for hycodan. T2DM - patient denies need for medication refills. If her local pharmacist cannot fix her wand glucometer, she may contact Dr. Valentina Lucks for an appointment.   Patient has appropriate follow-up scheduled already for 11/03/17 with Cardiology and CV[facility]. Cardiology had suggested increasing metoprolol if BP could tolerate it to maximize diastolic filling time, but I will defer medication changes in the setting of upcoming surgery and because patient plans to follow with [facility] for continuing primary care.

## 2017-11-03 ENCOUNTER — Other Ambulatory Visit: Payer: Self-pay

## 2017-11-03 ENCOUNTER — Ambulatory Visit (INDEPENDENT_AMBULATORY_CARE_PROVIDER_SITE_OTHER): Payer: Medicare Other | Admitting: Cardiothoracic Surgery

## 2017-11-03 ENCOUNTER — Ambulatory Visit (INDEPENDENT_AMBULATORY_CARE_PROVIDER_SITE_OTHER): Payer: Medicare Other | Admitting: Podiatry

## 2017-11-03 ENCOUNTER — Encounter: Payer: Self-pay | Admitting: Podiatry

## 2017-11-03 ENCOUNTER — Ambulatory Visit (INDEPENDENT_AMBULATORY_CARE_PROVIDER_SITE_OTHER): Payer: Medicare Other | Admitting: Cardiology

## 2017-11-03 ENCOUNTER — Encounter: Payer: Self-pay | Admitting: Cardiology

## 2017-11-03 ENCOUNTER — Encounter: Payer: Self-pay | Admitting: Cardiothoracic Surgery

## 2017-11-03 VITALS — BP 133/63 | HR 82 | Ht 62.0 in | Wt 156.0 lb

## 2017-11-03 VITALS — BP 100/58 | HR 75 | Ht 62.0 in | Wt 158.0 lb

## 2017-11-03 DIAGNOSIS — L608 Other nail disorders: Secondary | ICD-10-CM

## 2017-11-03 DIAGNOSIS — I350 Nonrheumatic aortic (valve) stenosis: Secondary | ICD-10-CM

## 2017-11-03 DIAGNOSIS — I38 Endocarditis, valve unspecified: Secondary | ICD-10-CM | POA: Diagnosis not present

## 2017-11-03 DIAGNOSIS — E1151 Type 2 diabetes mellitus with diabetic peripheral angiopathy without gangrene: Secondary | ICD-10-CM

## 2017-11-03 DIAGNOSIS — Q828 Other specified congenital malformations of skin: Secondary | ICD-10-CM

## 2017-11-03 DIAGNOSIS — I25118 Atherosclerotic heart disease of native coronary artery with other forms of angina pectoris: Secondary | ICD-10-CM | POA: Diagnosis not present

## 2017-11-03 DIAGNOSIS — I050 Rheumatic mitral stenosis: Secondary | ICD-10-CM

## 2017-11-03 DIAGNOSIS — I5031 Acute diastolic (congestive) heart failure: Secondary | ICD-10-CM

## 2017-11-03 NOTE — Progress Notes (Signed)
[address]       [place] [address]             [phone]        Denise Cuevas [place] Medical Record #[id number] Date of Birth: November 14, 1951  Referring: Dr Johnsie Cancel  Primary Care: Will be a new patient of [name], MD likely December  Chief Complaint:    Chief Complaint  Patient presents with  . Shortness of Breath  . Chest Pain  . Leg Swelling    Previous cardiac surgery: DATE OF PROCEDURE: 11/27/2011   OPERATIVE REPORT  PREOPERATIVE DIAGNOSES: Coronary occlusive disease with recent  subendocardial myocardial infarction.  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  SURGICAL PROCEDURE: Coronary artery bypass grafting x2 with the left  internal mammary to the left anterior descending coronary artery and  reverse saphenous vein graft to the circumflex coronary artery, with  endo-vein harvesting from the right thigh.   HPI:  This is a 66 year old Caucasian female with a past medical history of coronary artery disease (s/p  and CABG x 2 11/2011 and stents placed 2013), diabetes mellitus, non alcoholic cirrhosis, asthma,  who presented on 10/15/2017 with complaints of intermittent non exertional chest pain, worsening shortness of breath, fatigue, and cough. She states she had intermittent dizziness as well.  Regarding her cough, she went to [facility] who told her she had acute bronchitis and COPD. She was given a course of Azithromycin, without much improvement in her symptoms.   She then presented to her cardiologist's office on 10/15/2017 . She presented with complaints of "something is wrong but unsure of what".  Specifically, she had had intermittent chest pain (both with exertion and at rest) and was sleepy and tired. In addition, she had complaints of leg redness which she used hydrocortisone cream with some improvement. She was admitted to [facility] for  further evaluation and treatment. EKG apparently showed no acute ST changes.  Initial and subsequent Troponin I was less than 0.03. Echo done 10/16/2017 showed LVEF 60-65%,  moderate AS, moderate MR, and moderate to severe MS. A cardiac catheterization was done on 10/16/2017 and showed previously placed Ost Cx to Prox Cx stent (unknown type) is widely patent, previously placed 2nd Mrg stent (unknown type) is widely patent, Ost 2nd Mrg lesion is 50% stenosed, prox LAD lesion is 100% stenosed, previously placed Mid LAD to Dist LAD stent (unknown type) is widely patent, moderate aortic valve stenosis, and moderate mitral valve stenosis. A cardiothoracic consultation was requested , the patient stabilized and was discharged home to be seen in the office .  She comes into the office today feels much better than when she was in the hospital , she continues on diuretic therapy and notes she has lost approximately 15 pounds .   Since seen last week the patient has continued to lose several more pounds, she notes she feels much better denies shortness of breath, and especially notes that the edema in her lower extremities is much improved. As suggested to her last week she has not made an appointment with a dentist or seeing the dentist for preop clearance before redo aortic and mitral valve replacement   current Activity/ Functional Status: Patient was independent with mobility/ambulation, transfers, ADL's, IADL's.   Zubrod Score: At the time of surgery this patient's most appropriate activity status/level should be described as: []   0    Normal activity, no symptoms []     1    Restricted in physical strenuous activity but ambulatory, able to do out light work [x]     2    Ambulatory and capable of self care, unable to do work activities, up and about more than 50% of the time                            []     3    Only limited self care, in bed greater than 50% of waking hours []     4    Completely disabled,  no self care, confined to bed or chair []     5    Moribund  Past Medical History:  Diagnosis Date  . Angina   . Asthma   . CHF (congestive heart failure) ([place])   . 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 Coronary artery disease    s/p CABG  . Depression   . Diabetes mellitus    type 2  . Dyspnea   . Edema extremities    consistent edema lower legs, feet, and right quadrant abdominal pain-"goes and comes"  . Family history of adverse reaction to anesthesia    SISTER HAS NAUSEA  . Fibromyalgia   . GERD (gastroesophageal reflux disease)   . H/O hiatal hernia   . Headache(784.0)    occ. generalized  . Heart murmur   . Hypercholesteremia   . Hypertension   . Hypothyroid   . Myocardial infarction [facility]) 11/2011   MI x2- 9'83,3'82 coronary stent(chest pain episode at time)  . PONV (postoperative nausea and vomiting)     Past Surgical History:  Procedure Laterality Date  . CHOLECYSTECTOMY     open many yrs ago  . CORONARY ANGIOPLASTY WITH STENT PLACEMENT  05/06/2012  . KNEE SURGERY Right    scope surgery    Social History   Socioeconomic History  . Marital status: Married    Spouse name: Not on file  . Number of children: Not on file  . Years of education: Not on file  . Highest education level: Not on file  Occupational History  . Occupation: Retired Biomedical scientist, Environmental consultant principal  Tobacco Use  . Smoking status: Never Smoker  . Smokeless tobacco: Never Used  Substance and Sexual Activity  . Alcohol use: No  . Drug use: No  . Sexual activity: Yes    Allergies  Allergen Reactions  . Exenatide Nausea And Vomiting  . Ace Inhibitors Other (See Comments)    pseudoasthma    Current Facility-Administered Medications  Medication Dose Route Frequency Provider Last Rate Last Dose  . [MAR Hold] 0.9 %  sodium chloride infusion  250 mL Intravenous PRN Truitt Merle C, NP      . 0.9 %  sodium chloride infusion   Intravenous Continuous Burnell Blanks, MD 50 mL/hr at 10/20/17 1642    . [MAR Hold] acetaminophen (TYLENOL) tablet 650 mg  650 mg Oral Q4H PRN Burtis Junes, NP      . Doug Sou Hold] acetaminophen-codeine (TYLENOL #3) 300-30 MG per tablet 1 tablet  1 tablet Oral Q6H PRN Everrett Coombe, MD   1 tablet at 10/17/17 2117  . [MAR Hold] albuterol (PROVENTIL) (2.5 MG/3ML) 0.083% nebulizer solution 2.5 mg  2.5 mg Nebulization Q4H PRN Truitt Merle C, NP   2.5 mg at 10/18/17 0819  . [MAR Hold] aspirin chewable  tablet 81 mg  81 mg Oral Daily Andrena Mews T, MD      . Doug Sou Hold] atorvastatin (LIPITOR) tablet 40 mg  40 mg Oral q1800 Rogue Bussing, MD   40 mg at 10/19/17 1645  . [MAR Hold] DULoxetine (CYMBALTA) DR capsule 90 mg  90 mg Oral Q lunch Olene Floss [place], MD   90 mg at 10/19/17 1212  . [MAR Hold] ferrous sulfate tablet 325 mg  325 mg Oral Q breakfast Andrena Mews T, MD   325 mg at 10/19/17 0821  . [MAR Hold] furosemide (LASIX) tablet 20 mg  20 mg Oral Daily [name], MD      . Doug Sou Hold] insulin aspart (novoLOG) injection 0-9 Units  0-9 Units Subcutaneous TID WC Kinnie Feil, MD   2 Units at 10/20/17 1228  . [MAR Hold] insulin aspart protamine- aspart (NOVOLOG MIX 70/30) injection 40 Units  40 Units Subcutaneous BID AC Rogue Bussing, MD   40 Units at 10/19/17 1644  . [MAR Hold] levothyroxine (SYNTHROID, LEVOTHROID) tablet 50 mcg  50 mcg Oral QAC breakfast Andrena Mews T, MD   50 mcg at 10/19/17 0641  . [MAR Hold] metoprolol tartrate (LOPRESSOR) tablet 12.5 mg  12.5 mg Oral BID [name], MD      . Doug Sou Hold] mometasone-formoterol [facility]) 100-5 MCG/ACT inhaler 2 puff  2 puff Inhalation BID Rogue Bussing, MD   2 puff at 10/20/17 [id number]  . [MAR Hold] nitroGLYCERIN (NITROSTAT) SL tablet 0.4 mg  0.4 mg Sublingual Q5 Min x 3 PRN Burtis Junes, NP      . Doug Sou Hold] ondansetron (ZOFRAN) injection 4 mg  4 mg Intravenous Q6H PRN Burtis Junes, NP      . Doug Sou Hold]  pantoprazole (PROTONIX) EC tablet 80 mg  80 mg Oral Daily Rogue Bussing, MD   80 mg at 10/20/17 1006  . [MAR Hold] polyethylene glycol (MIRALAX / GLYCOLAX) packet 17 g  17 g Oral Daily Carlyle Dolly, MD   17 g at 10/19/17 0931  . [MAR Hold] sodium chloride flush (NS) 0.9 % injection 3 mL  3 mL Intravenous Q12H Truitt Merle C, NP   3 mL at 10/19/17 2203  . [MAR Hold] spironolactone (ALDACTONE) tablet 12.5 mg  12.5 mg Oral Daily Carlyle Dolly, MD   12.5 mg at 10/20/17 1008    Medications Prior to Admission  Medication Sig Dispense Refill Last Dose  . albuterol (PROVENTIL HFA;VENTOLIN HFA) 108 (90 BASE) MCG/ACT inhaler Inhale 2 puffs into the lungs every 6 (six) hours as needed for wheezing or shortness of breath. 1 Inhaler 2 10/15/2017 at Unknown time  . albuterol (PROVENTIL) (2.5 MG/3ML) 0.083% nebulizer solution Take 3 mLs (2.5 mg total) by nebulization every 2 (two) hours as needed for wheezing. 75 mL 0 10/14/2017  . aspirin 81 MG tablet Take 81 mg by mouth daily.   10/14/2017  . atorvastatin (LIPITOR) 40 MG tablet Take 40 mg by mouth daily.   10/14/2017  . budesonide-formoterol (SYMBICORT) 80-4.5 MCG/ACT inhaler Inhale 2 puffs into the lungs 2 (two) times daily.   10/14/2017  . DULoxetine (CYMBALTA) 30 MG capsule Take 90 mg by mouth daily with lunch. In conjunction with one 60 mg capsule to equal a total of 90 milligrams   10/14/2017  . DULoxetine (CYMBALTA) 60 MG capsule Take 90 mg by mouth daily with lunch. In conjunction with one 30 mg capsule to equal a total of 90  milligrams   10/14/2017  . furosemide (LASIX) 40 MG tablet Take 1 tablet (40 mg total) by mouth daily. 30 tablet 0 10/14/2017  . HUMALOG MIX 75/25 KWIKPEN (75-25) 100 UNIT/ML Kwikpen INJECT 60 UNITS SUBCUTANEOUSLY BEFORE BREAKFAST AND 60 UNITS BEFORE EVENING MEAL  10 10/15/2017  . JARDIANCE 25 MG TABS tablet Take 25 mg daily by mouth.  5 10/14/2017  . levothyroxine (SYNTHROID, LEVOTHROID) 50 MCG tablet Take 50 mcg  by mouth daily before breakfast.   10/14/2017  . metoprolol tartrate (LOPRESSOR) 25 MG tablet Take 12.5 mg by mouth 2 (two) times daily.    10/14/2017  . nitroGLYCERIN (NITROSTAT) 0.4 MG SL tablet Place 0.4 mg under the tongue every 5 (five) minutes as needed. For chest pain    at prn  . omeprazole-sodium bicarbonate (ZEGERID) 40-1100 MG per capsule Take 1 capsule by mouth daily before breakfast.   10/15/2017 at Unknown time  . spironolactone (ALDACTONE) 25 MG tablet Take 1 tablet (25 mg total) by mouth daily. 30 tablet 0 10/14/2017  . hydrocortisone cream 1 % Apply topically 2 (two) times daily. Apply to right lower leg (Patient taking differently: Apply 1 application topically 2 (two) times daily as needed for itching. Apply to right lower leg) 30 g 0 Taking    Family History  Problem Relation Age of Onset  . Heart disease Father 87       died of MI  . Emphysema Father        smoked  . Peripheral vascular disease Mother 74       bilaterial amputations   Review of Systems:     Cardiac Review of Systems: Y or N  Chest Pain [  Y  ] Exertional SOB  [Y  ]  Orthopnea [ N ]   Pedal Edema [  Y ]    Palpitations [ N ] Syncope  [ N ]  Presyncope [ N  ]  General Review of Systems: [Y] = yes [ N ]=no Constitional:  fatigue [ Y ]; nausea [ N ]; night sweats Aqua.Slicker  ]; fever Aqua.Slicker  ]; or chills [ N ]                                                               Dental: poor dentition (Patient has not seen a dentist in over a year. She had an appointment but had to cancel it because too tired)  Eye : diplopia [ N  ]; Amaurosis fugax[ N ]; Resp: cough [ Y ];  wheezing[ N ];  hemoptysis[ N ];   GI:  vomiting[ N];  dysphagia[N  ]; melena[N  ];  hematochezia Aqua.Slicker  ];  GU:  hematuria[ N ];   dysuria [ N ];  nocturia[ N ];               Skin: rash, swelling[ Y ];, or redness [ Y ];  Heme/Lymph: bruising[ Y ];  anemia[ Y ];              Neuro: TIA[ N ];   stroke[N  ];  vertigo[N  ];  seizures[N  ];    Psych:depression[ Y ]; anxiety[ Y ];  Endocrine: diabetes[ Y ];  thyroid dysfunction[Y  ];    Physical Exam:  BP (!) 121/41   Pulse 90   Temp 98.3 F (36.8 C) (Oral)   Resp 16   Ht 5\' 2"  (1.575 m)   Wt 162 lb (73.5 kg)   LMP  (LMP Unknown)   SpO2 96%   BMI 29.63 kg/m   General appearance: alert, cooperative, appears older than stated age and no distress Head: Normocephalic, without obvious abnormality, atraumatic Neck: no adenopathy, no carotid bruit, no JVD, supple, symmetrical, trachea midline and thyroid not enlarged, symmetric, no tenderness/mass/nodules Lymph nodes: Cervical, supraclavicular, and axillary nodes normal. Resp: clear to auscultation bilaterally Back: symmetric, no curvature. ROM normal. No CVA tenderness. Cardio: systolic murmur: holosystolic 3/6, crescendo at lower left sternal border GI: soft, non-tender; bowel sounds normal; no masses,  no organomegaly Extremities: extremities normal, atraumatic, no cyanosis or edema and Homans sign is negative, no sign of DVT Neurologic: Grossly normal   Diagnostic Studies & Laboratory data:     Recent Radiology Findings:  Dg Chest 2 View  Result Date: 10/15/2017 CLINICAL DATA:  Chest pain, shortness of breath and cough for 4 weeks. EXAM: CHEST  2 VIEW COMPARISON:  10/12/2017 and prior radiographs FINDINGS: Cardiomegaly and CABG changes again noted. There is no evidence of focal airspace disease, pulmonary edema, suspicious pulmonary nodule/mass, pleural effusion, or pneumothorax. No acute bony abnormalities are identified. IMPRESSION: Cardiomegaly without evidence of acute cardiopulmonary disease. Electronically Signed   By: Margarette Canada M.D.   On: 10/15/2017 13:58   US Abdomen Complete  Result Date: 10/10/2017 CLINICAL DATA:  66 year old female with cirrhosis. Hepatocellular carcinoma screening. History of cholecystectomy. EXAM: ABDOMEN ULTRASOUND COMPLETE COMPARISON:  03/31/2017 and prior exams FINDINGS: Gallbladder:  The gallbladder is not visualized compatible with cholecystectomy. Common bile duct: Diameter: 7 mm. No intrahepatic or extrahepatic biliary dilatation. Liver: Nodular contour and coarse hepatic echotexture compatible with cirrhosis. No focal hepatic masses are identified. Portal vein is patent on color Doppler imaging with normal direction of blood flow towards the liver. IVC: No abnormality visualized. Pancreas: Visualized portion unremarkable. Spleen: Splenomegaly again noted with a splenic volume of 715 cc Right Kidney: Length: 11.4 cm. Echogenicity within normal limits. No mass or hydronephrosis visualized. Left Kidney: Length: 11.7 cm. Echogenicity within normal limits. No mass or hydronephrosis visualized. Abdominal aorta: No aneurysm visualized. Other findings: None.  No ascites noted. IMPRESSION: 1. Cirrhosis without evidence of focal mass. 2. Splenomegaly again identified. 3. No other significant abnormalities. Electronically Signed   By: Margarette Canada M.D.   On: 10/10/2017 10:26   I have independently reviewed the above radiologic studies.  Recent Lab Findings: Lab Results  Component Value Date   WBC 6.5 10/20/2017   HGB 9.4 (L) 10/20/2017   HCT 31.6 (L) 10/20/2017   PLT 99 (L) 10/20/2017   GLUCOSE 166 (H) 10/19/2017   CHOL 121 10/18/2017   TRIG 61 10/18/2017   HDL 33 (L) 10/18/2017   LDLCALC 76 10/18/2017   ALT 34 10/17/2017   AST 58 (H) 10/17/2017   NA 136 10/19/2017   K 3.8 10/19/2017   CL 103 10/19/2017   CREATININE 1.06 (H) 10/19/2017   BUN 22 (H) 10/19/2017   CO2 26 10/19/2017   TSH 2.664 10/15/2017   INR 1.31 10/15/2017   HGBA1C 8.4 (H) 10/15/2017   Imaging Studies:  RIGHT/LEFT HEART CATH AND CORONARY/GRAFT ANGIOGRAPHY by Dr. Angelena Form on 10/20/2017:  Conclusion     Prox RCA to Mid RCA lesion is 20% stenosed.  Previously placed Ost Cx to Prox Cx stent (unknown type) is  widely patent.  Previously placed 2nd Mrg stent (unknown type) is widely patent.  Ost 2nd  Mrg lesion is 50% stenosed.  Prox LAD lesion is 100% stenosed.  SVG graft was not visualized.  LIMA graft was visualized by angiography and is normal in caliber.  The graft exhibits no disease.  Previously placed Mid LAD to Dist LAD stent (unknown type) is widely patent.  Ost LAD to Prox LAD lesion is 20% stenosed.  There is moderate aortic valve stenosis.  There is moderate mitral valve stenosis.  Hemodynamic findings consistent with moderate pulmonary hypertension.  LV end diastolic pressure is normal.   1. Double vessel CAD s/p 2V CABG with 1/2 patent bypass grafts 2. The LAD courses to the apex. The mid LAD is occluded after a moderate caliber diagonal branch. The mid and distal LAD fills from the patent LIMA graft. The stent in the distal LAD is patent without restenosis.  3. The Circumflex artery is patent. There are patent stents in the proximal Circumflex and distal segment of the obtuse marginal branch. The proximal segment of the OM branch prior to the stent has a moderate non-obstructive stenosis. The vein graft to the OM branch is known to be occluded and was not selectively engaged today.  4. The RCA is a small to moderate caliber co-dominant vessel with mild non-obstructive plaque.  5. Moderate Aortic stenosis (peak to peak gradient 23 mmHg, mean gradient 20 mmHg, AVA 2.2 cm2) 6. Moderately severe mitral stenosis (peak to peak gradient 14 mmHg, mean gradient 12.5 mmHg, MVA 3.32cm2)  Recommendations: Medical management of CAD. Close follow up of her valve disease. Consider surgical consultation.     Hemo Data    Most Recent Value  Fick Cardiac Output 10.71 L/min  Fick Cardiac Output Index 6.12 (L/min)/BSA  Aortic Mean Gradient 20 mmHg  Aortic Peak Gradient 23 mmHg  Aortic Valve Area 2.20  Aortic Value Area Index 1.26 cm2/BSA  RA A Wave 12 mmHg  RA V Wave 10 mmHg  RA Mean 7 mmHg  RV Systolic Pressure 55 mmHg  RV Diastolic Pressure 0 mmHg  RV EDP 13 mmHg   PA Systolic Pressure 59 mmHg  PA Diastolic Pressure 20 mmHg  PA Mean 40 mmHg  PW A Wave 27 mmHg  PW V Wave 45 mmHg  PW Mean 25 mmHg  AO Systolic Pressure 725 mmHg  AO Diastolic Pressure 53 mmHg  AO Mean 76 mmHg  LV Systolic Pressure 366 mmHg  LV Diastolic Pressure -10 mmHg  LV EDP 0 mmHg  Arterial Occlusion Pressure Extended Systolic Pressure 440 mmHg  Arterial Occlusion Pressure Extended Diastolic Pressure 45 mmHg  Arterial Occlusion Pressure Extended Mean Pressure 79 mmHg  Left Ventricular Apex Extended Systolic Pressure 347 mmHg  Left Ventricular Apex Extended Diastolic Pressure 0 mmHg  Left Ventricular Apex Extended EDP Pressure 10 mmHg  QP/QS 1  TPVR Index 6.54 HRUI  TSVR Index 12.43 HRUI  PVR SVR Ratio 0.17  TPVR/TSVR Ratio 0.53   ECHO:TEE 10/20/2017 Transesophageal Echocardiography  Patient:    Denise, Cuevas MR #:       [id number] Study Date: 10/20/2017 Gender:     F Age:        28 Height:     157.5 cm Weight:     73.5 kg BSA:        1.82 m^2 Pt. Status: Room:       [facility]   ADMITTING    [place], [place]  Ena Dawley, M.D.  PERFORMING   Ena Dawley, M.D.  REFERRING    Ena Dawley, M.D.  [place] [id number]  SONOGRAPHER  Mikki Santee  cc:  ------------------------------------------------------------------- LV EF: 60% -   65%  ------------------------------------------------------------------- Indications:      Aortic stenosis 424.1.  ------------------------------------------------------------------- History:   PMH:   Murmur.  Coronary artery disease.  Congestive heart failure.  Mitral valve disease.  Risk factors:  Diabetes mellitus.  ------------------------------------------------------------------- Study Conclusions  - Left ventricle: Systolic function was normal. The estimated   ejection fraction was in the range of 60% to 65%. Wall motion was   normal; there were no regional wall  motion abnormalities. - Aortic valve: Cusp separation was moderately reduced. There was   moderate stenosis. There was mild regurgitation. - Mitral valve: Severely thickened, mildly calcified leaflets .   Leaflet separation was moderately reduced. The findings are   consistent with moderate to severe stenosis. There was moderate   regurgitation. Mean gradient (D): 10 mm Hg. - Left atrium: The atrium was dilated. No evidence of thrombus in   the atrial cavity or appendage. No evidence of thrombus in the   atrial cavity or appendage. - Right atrium: No evidence of thrombus in the atrial cavity or   appendage.  Impressions:  - Aortic valve is severely calcified predominantly in the   non-coronary leaflet, leaflet opening is moderately decreased but   still opening. Peak/mean transaortic gradients were 64/33 mmHg.   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 There is at most   moderate mitral regurgitation but moderate to severe mitral   stenosis with mean transmitral gradient 10 mmHg.  ------------------------------------------------------------------- Study data:   Study status:  Routine.  Consent:  The risks, benefits, and alternatives to the procedure were explained to the patient and informed consent was obtained.  Procedure:  The patient reported no pain pre or post test. Initial setup. The patient was brought to the laboratory. Surface ECG leads were monitored. Sedation. Conscious sedation was administered by cardiology staff. Transesophageal echocardiography. Topical anesthesia was obtained using viscous lidocaine. A transesophageal probe was inserted by the attending cardiologist. Image quality was adequate.  Study completion:  The patient tolerated the procedure well. There were no complications.  Administered medications:   Fentanyl, 53mcg. Midazolam, 4mg .          Diagnostic  transesophageal echocardiography.  2D and color Doppler.  Birthdate:  Patient birthdate: Jan 12, 1951.  Age:  Patient is 66 yr old.  Sex:  Gender: female.    BMI: 29.6 kg/m^2.  Blood pressure:     110/39  Patient status:  Inpatient.  Study date:  Study date: 10/20/2017. Study time: 02:01 PM.  Location:  Endoscopy.  -------------------------------------------------------------------  ------------------------------------------------------------------- Left ventricle:  Systolic function was normal. The estimated ejection fraction was in the range of 60% to 65%. Wall motion was normal; there were no regional wall motion abnormalities.  ------------------------------------------------------------------- Aortic valve:   Trileaflet; severely thickened, severely calcified leaflets. Cusp separation was moderately reduced.  Doppler:   There was moderate stenosis.   There was mild regurgitation.    Mean gradient (S): 33 mm Hg. Peak gradient (S): 64 mm Hg.  ------------------------------------------------------------------- Aorta:  There was mild atheroma. There was no evidence for dissection. Aortic root: The aortic root was not dilated. Ascending aorta: The ascending aorta was normal in size. Descending aorta: The descending aorta was normal in size.  -------------------------------------------------------------------  Mitral valve:   Severely thickened, mildly calcified leaflets . Leaflet separation was moderately reduced.  Doppler:   The findings are consistent with moderate to severe stenosis.   There was moderate regurgitation.    Mean gradient (D): 10 mm Hg.  ------------------------------------------------------------------- Left atrium:  The atrium was dilated.  No evidence of thrombus in the atrial cavity or appendage.  No evidence of thrombus in the atrial cavity or appendage. The appendage was morphologically a left appendage, multilobulated, and of normal size.  Emptying velocity was normal.  ------------------------------------------------------------------- Right ventricle:  The cavity size was normal. Wall thickness was normal. Systolic function was normal.  ------------------------------------------------------------------- Pulmonic valve:    Structurally normal valve.  ------------------------------------------------------------------- Tricuspid valve:   Structurally normal valve.   Leaflet separation was normal.  Doppler:  There was mild regurgitation.  ------------------------------------------------------------------- Pulmonary artery:   The main pulmonary artery was normal-sized.  ------------------------------------------------------------------- Right atrium:  The atrium was normal in size.  No evidence of thrombus in the atrial cavity or appendage. The appendage was morphologically a right appendage.  ------------------------------------------------------------------- Pericardium:  There was no pericardial effusion.  ------------------------------------------------------------------- Measurements   Aortic valve                              Value  Aortic valve peak velocity, S             401   cm/s  Aortic valve mean velocity, S             238   cm/s  Aortic valve VTI, S                       76.5  cm  Aortic mean gradient, S                   28    mm Hg  Aortic peak gradient, S                   64    mm Hg    Mitral valve                              Value  Mitral mean velocity, D                   148   cm/s  Mitral mean gradient, D                   10    mm Hg  Mitral annulus VTI, D                     59.3  cm  Mitral maximal regurg velocity, PISA      535   cm/s  Mitral regurg VTI, PISA                   151   cm  Legend: (L)  and  (H)  mark values outside specified reference range.  ------------------------------------------------------------------- Prepared and Electronically Authenticated  by  Ena Dawley, M.D. 2018-11-12T15:05:21  Assessment / plan:   1.  Moderate aortic stenosis (peak to peak gradient 23 mmHg, mean gradient 20 mmHg, AVA 2.2 cm2) 2.  Moderately severe mitral stenosis (peak to peak gradient 14 mmHg, mean gradient 12.5 mmHg, MVA 3.32cm2) 3. Diabetes insulin-dependent. HGA1C done 10/15/2017 8.4. Needs better glucose  control. Will need closed follow up with medical doctor after discharge. 4. Hypothyroidism: Recent TSH 2.66 5. History of iron deficiency anemia,thrombocytopenia, and splenomegaly-questionable if this is related to cirrhosis 6. Acute diastolic HF/valvular heart disease.   Further discussed with the patient proceeding with redo sternotomy and aortic and mitral valve replacement.  The vein graft to the circumflex is occluded, the mammary to the LAD is patent.  Depending on the degree of scarring and difficulty we can consider a vein graft to the circumflex, but that does involve significantly more dissection away from the areas required for aortic valve replacement and mitral valve replacement.  With the patient's underlying cirrhosis,  And wished to avoid long-term Coumadi she prefers tissue valves.  She will contact the dentist tomorrow and then, update the office on her dental clearance and we will plan when to proceed with surgical treatment of her aortic and mitral valve disease.    [name] MD      [address] [place] [address] Office [phone]   [place]

## 2017-11-03 NOTE — Patient Instructions (Addendum)
Medication Instructions:  Your physician recommends that you continue on your current medications as directed. Please refer to the Current Medication list given to you today.  Labwork: NONE  Testing/Procedures: NONE  Follow-Up: No follow-up needed at this time.   If you need a refill on your cardiac medications before your next appointment, please call your pharmacy.

## 2017-11-03 NOTE — Progress Notes (Signed)
Patient ID: Denise Cuevas, female   DOB: 11-21-1951, 66 y.o.   MRN: [id number]    Subjective: This patient presents today complaining of a painful nucleated skin lesion on the plantar lateral aspect left foot. This patient was last evaluated for this problem on the visit of 04/29/2017 with debridement   Patient is diabetic without history of foot ulceration, claudication or amputation Patient denies smoking history    Patient estimates 5 foot 2 inches and 172 pounds  Orientated 3  Vascular: DP and PT pulses 1/4 bilaterally Capillary reflex delay bilaterally  Neurological: Sensation to 10 g monofilament wire intact 5/5 bilaterally Vibratory sensation reactive bilaterally Ankle reflexes reactive bilaterally  Dermatological: No open skin lesions bilaterally Nucleated plantar keratoses base of fifth left metatarsal. There is no surrounding erythema, edema, warmth Diffuse plantar keratoses plantar distal MPJ 2-4 left Atrophic skin with absent hair growth bilaterally Elongated incurvated toenails 6-10  Musculoskeletal: Manual motor testing dorsi flexion, plantar flexion, inversion, eversion 5/5 bilaterally   Assessment: Diabetic with peripheral arterial disease associated with diminished pedal pulses Protective sensation intact Porokeratosis 1  Plan: Porokeratosis on the plantar aspect left foot was debrided without any bleeding  Debride nails 10 without any bleeding  Reappoint 3 months

## 2017-11-03 NOTE — Progress Notes (Addendum)
11/03/2017 SENNIE BORDEN   10/20/1951  [id number]  Primary Physician [name], MD Primary Cardiologist: Dr. [name]    Reason for Visit/CC: [facility] f/u for Acute HFpEF w/ Moderate to Severe AS and Moderate to Severe MR/MS.   HPI:  NERISSA CONSTANTIN is a 66 y.o. female who is being seen today for post hospital f/u. She is followed by Dr. [name] and has a h/o CAD s/p CABG in 2012 w/ LIMA to LAD, SVG to OM with subsequent DES to distal anastomosis SVG OM and distal LAD in 2013. She was admitted 10/15/17 for evaluation for cough, dyspnea, weight gain and intermitted CP. She was found to be in acute CHF and was treated with IV diuretics. Symptoms improved with diuresis. 2D echo was obtained 10/16/2017 which showed normal LVEF at 60-65%, moderate AS and moderate MR. This was followed by TEE which also confirmed significant valvular disease, moderate AS with mild AI and moderate to severe MS. R/LHC was conducted on 10/20/17 to reassess coronary anatomy as it was felt that she would likely need valve surgery. Cath showed double vessel CAD s/p 2V CABG with 1/2 patent bypass grafts. LIMA is patent. The stent in the distal LAD is patent without restenosis. The Circumflex artery is patent. There are patent stents in the proximal Circumflex and distal segment of the obtuse marginal branch. The proximal segment of the OM branch prior to the stent has a moderate non-obstructive stenosis. The vein graft to the OM branch is known to be occluded and was not selectively engaged.  Dr. Servando Snare, who performed her CABG in 2012 was consulted during her hospitalization. Recommendations are for redo sternotomy for both AVR and MVR. She has f/u with Dr. Servando Snare later today to discuss timing of surgery. Pt has considered her options and notes she would like to consider tissue valve replacement as opposed to mechanical.  Today in clinic, she notes significant improvement. She denies any further dyspnea. Cough has  resolved. LEE significantly improve and weight continues to trend down. Her BP is stable on Lasix 40 mg. She still has mild, trace bilateral pretibial edema on exam. Lungs are CTAB. She notes that she still has occasional anterior chest pain that is responsive to SL NTG. She denies any current symptoms.     Current Meds  Medication Sig  . albuterol (PROVENTIL HFA;VENTOLIN HFA) 108 (90 BASE) MCG/ACT inhaler Inhale 2 puffs into the lungs every 6 (six) hours as needed for wheezing or shortness of breath.  Marland Kitchen albuterol (PROVENTIL) (2.5 MG/3ML) 0.083% nebulizer solution Take 3 mLs (2.5 mg total) by nebulization every 2 (two) hours as needed for wheezing.  Marland Kitchen aspirin 81 MG tablet Take 81 mg by mouth daily.  Marland Kitchen atorvastatin (LIPITOR) 40 MG tablet Take 40 mg by mouth daily.  . budesonide-formoterol (SYMBICORT) 80-4.5 MCG/ACT inhaler Inhale 2 puffs into the lungs 2 (two) times daily.  . DULoxetine (CYMBALTA) 30 MG capsule Take 90 mg by mouth daily with lunch. In conjunction with one 60 mg capsule to equal a total of 90 milligrams  . DULoxetine (CYMBALTA) 60 MG capsule Take 90 mg by mouth daily with lunch. In conjunction with one 30 mg capsule to equal a total of 90 milligrams  . ferrous sulfate 325 (65 FE) MG tablet Take 1 tablet (325 mg total) daily with breakfast by mouth.  . furosemide (LASIX) 40 MG tablet Take 1 tablet (40 mg total) by mouth daily.  Marland Kitchen HUMALOG MIX 75/25 KWIKPEN (75-25) 100 UNIT/ML [employer]  INJECT 60 UNITS SUBCUTANEOUSLY BEFORE BREAKFAST AND 60 UNITS BEFORE EVENING MEAL  . HYDROcodone-homatropine (HYCODAN) 5-1.5 MG/5ML syrup Take 5 mLs by mouth every 6 (six) hours as needed for cough.  Marland Kitchen JARDIANCE 25 MG TABS tablet Take 25 mg daily by mouth.  . levothyroxine (SYNTHROID, LEVOTHROID) 50 MCG tablet Take 50 mcg by mouth daily before breakfast.  . metoprolol tartrate (LOPRESSOR) 25 MG tablet Take 12.5 mg by mouth 2 (two) times daily.   . nitroGLYCERIN (NITROSTAT) 0.4 MG SL tablet Place 0.4 mg  under the tongue every 5 (five) minutes as needed. For chest pain  . omeprazole-sodium bicarbonate (ZEGERID) 40-1100 MG per capsule Take 1 capsule by mouth daily before breakfast.  . spironolactone (ALDACTONE) 25 MG tablet Take 1 tablet (25 mg total) by mouth daily.   Allergies  Allergen Reactions  . Exenatide Nausea And Vomiting  . Ace Inhibitors Other (See Comments)    pseudoasthma   Past Medical History:  Diagnosis Date  . Angina   . Asthma   . CHF (congestive heart failure) ([place])   . Cirrhosis of liver ([place]) 08/2014   idiopathic  . Coronary artery disease    s/p CABG  . Depression   . Diabetes mellitus    type 2  . Dyspnea   . Edema extremities    consistent edema lower legs, feet, and right quadrant abdominal pain-"goes and comes"  . Family history of adverse reaction to anesthesia    SISTER HAS NAUSEA  . Fibromyalgia   . GERD (gastroesophageal reflux disease)   . H/O hiatal hernia   . Headache(784.0)    occ. generalized  . Heart murmur   . Hypercholesteremia   . Hypertension   . Hypothyroid   . Myocardial infarction [facility]) 11/2011   MI x2- 1'96,2'22 coronary stent(chest pain episode at time)  . PONV (postoperative nausea and vomiting)    Family History  Problem Relation Age of Onset  . Heart disease Father 77       died of MI  . Emphysema Father        smoked  . Peripheral vascular disease Mother 32       bilaterial amputations   Past Surgical History:  Procedure Laterality Date  . CHOLECYSTECTOMY     open many yrs ago  . COLONOSCOPY WITH PROPOFOL N/A 08/24/2014   Procedure: COLONOSCOPY WITH PROPOFOL;  Surgeon: Arta Silence, MD;  Location: [facility];  Service: Endoscopy;  Laterality: N/A;  . CORONARY ANGIOPLASTY WITH STENT PLACEMENT  05/06/2012  . CORONARY ARTERY BYPASS GRAFT  11/27/2011   Procedure: CORONARY ARTERY BYPASS GRAFTING (CABG);  Surgeon: [name], MD;  Location: [place];  Service: Open Heart Surgery;  Laterality: N/A;  coronary  artery bypass graft on pump times two utilizing left internal mammary artery and right saphenous vein harvested endoscopically   . ESOPHAGOGASTRODUODENOSCOPY (EGD) WITH PROPOFOL N/A 08/24/2014   Procedure: ESOPHAGOGASTRODUODENOSCOPY (EGD) WITH PROPOFOL;  Surgeon: Arta Silence, MD;  Location: [facility];  Service: Endoscopy;  Laterality: N/A;  . KNEE SURGERY Right    scope surgery  . LEFT HEART CATHETERIZATION WITH CORONARY ANGIOGRAM N/A 11/19/2011   Procedure: LEFT HEART CATHETERIZATION WITH CORONARY ANGIOGRAM;  Surgeon: Candee Furbish, MD;  Location: [facility] CATH LAB;  Service: Cardiovascular;  Laterality: N/A;  . LEFT HEART CATHETERIZATION WITH CORONARY ANGIOGRAM  05/06/2012   Procedure: LEFT HEART CATHETERIZATION WITH CORONARY ANGIOGRAM;  Surgeon: Jettie Booze, MD;  Location: [facility] CATH LAB;  Service: Cardiovascular;;  . LEFT HEART CATHETERIZATION WITH  CORONARY/GRAFT ANGIOGRAM N/A 03/24/2012   Procedure: LEFT HEART CATHETERIZATION WITH Beatrix Fetters;  Surgeon: Jettie Booze, MD;  Location: [facility] C[facility] LAB;  Service: Cardiovascular;  Laterality: N/A;  . PERCUTANEOUS CORONARY STENT INTERVENTION (PCI-S)  03/24/2012   Procedure: PERCUTANEOUS CORONARY STENT INTERVENTION (PCI-S);  Surgeon: Jettie Booze, MD;  Location: [facility] CATH LAB;  Service: Cardiovascular;;  . PERCUTANEOUS CORONARY STENT INTERVENTION (PCI-S) Bilateral 05/06/2012   Procedure: PERCUTANEOUS CORONARY STENT INTERVENTION (PCI-S);  Surgeon: Jettie Booze, MD;  Location: [facility] CATH LAB;  Service: Cardiovascular;  Laterality: Bilateral;  . RIGHT/LEFT HEART CATH AND CORONARY/GRAFT ANGIOGRAPHY N/A 10/20/2017   Procedure: RIGHT/LEFT HEART CATH AND CORONARY/GRAFT ANGIOGRAPHY;  Surgeon: Burnell Blanks, MD;  Location: [place] CV LAB;  Service: Cardiovascular;  Laterality: N/A;  . TEE WITHOUT CARDIOVERSION N/A 10/20/2017   Procedure: TRANSESOPHAGEAL ECHOCARDIOGRAM (TEE);  Surgeon: Dorothy Spark, MD;  Location: [facility]  ENDOSCOPY;  Service: Cardiovascular;  Laterality: N/A;   Social History   Socioeconomic History  . Marital status: Married    Spouse name: Not on file  . Number of children: Not on file  . Years of education: Not on file  . Highest education level: Not on file  Social Needs  . Financial resource strain: Not on file  . Food insecurity - worry: Not on file  . Food insecurity - inability: Not on file  . Transportation needs - medical: Not on file  . Transportation needs - non-medical: Not on file  Occupational History  . Occupation: Retired Doctor, hospital  Tobacco Use  . Smoking status: Never Smoker  . Smokeless tobacco: Never Used  Substance and Sexual Activity  . Alcohol use: No  . Drug use: No  . Sexual activity: Yes  Other Topics Concern  . Not on file  Social History Narrative   Retired Naval architect           Review of Systems: General: negative for chills, fever, night sweats or weight changes.  Cardiovascular: negative for chest pain, dyspnea on exertion, edema, orthopnea, palpitations, paroxysmal nocturnal dyspnea or shortness of breath Dermatological: negative for rash Respiratory: negative for cough or wheezing Urologic: negative for hematuria Abdominal: negative for nausea, vomiting, diarrhea, bright red blood per rectum, melena, or hematemesis Neurologic: negative for visual changes, syncope, or dizziness All other systems reviewed and are otherwise negative except as noted above.   Physical Exam:  Blood pressure (!) 100/58, pulse 75, height 5\' 2"  (1.575 m), weight 158 lb (71.7 kg), SpO2 99 %.  General appearance: alert, cooperative and no distress Neck: no carotid bruit and no JVD Lungs: clear to auscultation bilaterally Heart: regular rate and rhythm, 3/6 murmur throughout precordium  Extremities: trace bilateral pretibial edema atraumatic, no cyanosis Pulses: 2+ and symmetric Skin: Skin color, texture, turgor normal. No rashes or  lesions Neurologic: Grossly normal  EKG not performed  -- personally reviewed   ASSESSMENT AND PLAN:   1. HFpEF: recent exacerbation in the setting of moderate AS and moderate to severe MR/MS. Treated with IV Lasix and significant symptomatic improvement. Dyspnea resolved but still with trace bilateral pretibial edema. Continue Lasix 40 mg daily.   2. Symptomatic Valvular Disease: Moderate AS, moderate to severe MS and moderate MR. She will need redo sternotomy for AVR and MVR. Pt has f/u with Dr. Servando Snare later today to discuss timing.   3. CAD: H/o CABG in 2012 w/ LIMA to LAD, SVG to OM with subsequent DES to distal anastomosis SVG OM and distal LAD in  2013. Recent LHC earlier this month showed double vessel CAD s/p 2V CABG with 1/2 patent bypass grafts. LIMA is patent. The stent in the distal LAD is patent without restenosis. The Circumflex artery is patent. There are patent stents in the proximal Circumflex and distal segment of the obtuse marginal branch. The proximal segment of the OM branch prior to the stent has a moderate non-obstructive stenosis. The vein graft to the OM branch is known to be occluded. Pt complains of intermittent SSCP. ? If she would benefit from new SVG to OM. Will deter to Dr. Servando Snare, who she will see today.    F/u after valve surgery. We will also be available during her hospitalization for valve surgery if needed.   Kanylah Muench Ladoris Gene, MHS [facility] 11/03/2017 11:08 AM

## 2017-11-03 NOTE — Patient Instructions (Signed)

## 2017-11-05 ENCOUNTER — Other Ambulatory Visit: Payer: Self-pay

## 2017-11-05 DIAGNOSIS — I050 Rheumatic mitral stenosis: Secondary | ICD-10-CM

## 2017-11-05 DIAGNOSIS — I350 Nonrheumatic aortic (valve) stenosis: Secondary | ICD-10-CM

## 2017-11-05 DIAGNOSIS — I2510 Atherosclerotic heart disease of native coronary artery without angina pectoris: Secondary | ICD-10-CM

## 2017-11-07 NOTE — Pre-Procedure Instructions (Signed)
Denise Cuevas  11/07/2017      [facility], [place] [address] Phone: [phone] Fax: [phone]  [facility] [place], [place] - [address] [place] [address] Phone: [phone] Fax: [phone]    Your procedure is scheduled on 11/12/17.  Report to [facility] Admitting at 630 A.M.  Call this number if you have problems the morning of surgery:  [phone]   Remember:  Do not eat food or drink liquids after midnight.  Take these medicines the morning of surgery with A SIP OF WATER --all inhalers,cymbalta, synthroid,lopressor   Do not wear jewelry, make-up or nail polish.  Do not wear lotions, powders, or perfumes, or deoderant.  Do not shave 48 hours prior to surgery.  Men may shave face and neck.  Do not bring valuables to the hospital.  [facility] is not responsible for any belongings or valuables.  Contacts, dentures or bridgework may not be worn into surgery.  Leave your suitcase in the car.  After surgery it may be brought to your room.  For patients admitted to the hospital, discharge time will be determined by your treatment team.  Patients discharged the day of surgery will not be allowed to drive home.   Name and phone number of your driver:   Special instructions:  Do not take any,anti-inflammatories,vitamins,or herbal supplements 5-7 days prior to surgery.  Please read over the following fact sheets that you were given. Surgical Site Infection Prevention    How to Manage Your Diabetes Before and After Surgery  Why is it important to control my blood sugar before and after surgery? . Improving blood sugar levels before and after surgery helps healing and can limit problems. . A way of improving blood sugar control is eating a healthy diet by: o  Eating less sugar and  carbohydrates o  Increasing activity/exercise o  Talking with your doctor about reaching your blood sugar goals . High blood sugars (greater than 180 mg/dL) can raise your risk of infections and slow your recovery, so you will need to focus on controlling your diabetes during the weeks before surgery. . Make sure that the doctor who takes care of your diabetes knows about your planned surgery including the date and location.  How do I manage my blood sugar before surgery? . Check your blood sugar at least 4 times a day, starting 2 days before surgery, to make sure that the level is not too high or low. o Check your blood sugar the morning of your surgery when you wake up and every 2 hours until you get to the Short Stay unit. . If your blood sugar is less than 70 mg/dL, you will need to treat for low blood sugar: o Do not take insulin. o Treat a low blood sugar (less than 70 mg/dL) with  cup of clear juice (cranberry or apple), 4 glucose tablets, OR glucose gel. Recheck blood sugar in 15 minutes after treatment (to make sure it is greater than 70 mg/dL). If your blood sugar is not greater than 70 mg/dL on recheck, call [phone] o  for further instructions. . Report your blood sugar to the short stay nurse when you get to Short Stay.  . If you are admitted to the hospital after surgery: o Your blood sugar will  be checked by the staff and you will probably be given insulin after surgery (instead of oral diabetes medicines) to make sure you have good blood sugar levels. o The goal for blood sugar control after surgery is 80-180 mg/dL.              WHAT DO I DO ABOUT MY DIABETES MEDICATION?   Marland Kitchen Do not take oral diabetes medicines (pills) the morning of surgery.  . THE NIGHT BEFORE SURGERY, take ___________ units of ___________insulin.       Marland Kitchen HE MORNING OF SURGERY, take _____________ units of __________insulin.  . The day of surgery, do not take other diabetes injectables,  including Byetta (exenatide), Bydureon (exenatide ER), Victoza (liraglutide), or Trulicity (dulaglutide).  . If your CBG is greater than 220 mg/dL, you may take  of your sliding scale (correction) dose of insulin.  Other Instructions:          Patient Signature:  Date:   Nurse Signature:  Date:   Reviewed and Endorsed by [facility] Patient Education Committee, August 2015

## 2017-11-10 ENCOUNTER — Ambulatory Visit (HOSPITAL_BASED_OUTPATIENT_CLINIC_OR_DEPARTMENT_OTHER)
Admission: RE | Admit: 2017-11-10 | Discharge: 2017-11-10 | Disposition: A | Discharge status: Home / Self Care | Payer: Medicare Other | Source: Ambulatory Visit | Attending: Cardiothoracic Surgery | Admitting: Cardiothoracic Surgery

## 2017-11-10 ENCOUNTER — Ambulatory Visit (HOSPITAL_COMMUNITY)
Admission: RE | Admit: 2017-11-10 | Discharge: 2017-11-10 | Disposition: A | Discharge status: Home / Self Care | Payer: Medicare Other | Source: Ambulatory Visit | Attending: Cardiothoracic Surgery | Admitting: Cardiothoracic Surgery

## 2017-11-10 ENCOUNTER — Encounter (HOSPITAL_COMMUNITY)
Admission: RE | Admit: 2017-11-10 | Discharge: 2017-11-10 | Disposition: A | Discharge status: Home / Self Care | Payer: Medicare Other | Source: Ambulatory Visit | Attending: Cardiothoracic Surgery | Admitting: Cardiothoracic Surgery

## 2017-11-10 ENCOUNTER — Encounter (HOSPITAL_COMMUNITY): Payer: Self-pay

## 2017-11-10 DIAGNOSIS — I050 Rheumatic mitral stenosis: Secondary | ICD-10-CM

## 2017-11-10 DIAGNOSIS — I2510 Atherosclerotic heart disease of native coronary artery without angina pectoris: Secondary | ICD-10-CM

## 2017-11-10 DIAGNOSIS — Z01810 Encounter for preprocedural cardiovascular examination: Secondary | ICD-10-CM | POA: Insufficient documentation

## 2017-11-10 DIAGNOSIS — I350 Nonrheumatic aortic (valve) stenosis: Secondary | ICD-10-CM

## 2017-11-10 DIAGNOSIS — I6523 Occlusion and stenosis of bilateral carotid arteries: Secondary | ICD-10-CM | POA: Insufficient documentation

## 2017-11-10 DIAGNOSIS — Z01818 Encounter for other preprocedural examination: Secondary | ICD-10-CM | POA: Insufficient documentation

## 2017-11-10 DIAGNOSIS — Z01812 Encounter for preprocedural laboratory examination: Secondary | ICD-10-CM | POA: Insufficient documentation

## 2017-11-10 HISTORY — DX: Chronic obstructive pulmonary disease, unspecified: J44.9

## 2017-11-10 LAB — PULMONARY FUNCTION TEST
DL/VA % pred: 110 %
DL/VA: 5.01 ml/min/mmHg/L
DLCO cor % pred: 81 %
DLCO cor: 17.64 ml/min/mmHg
DLCO unc % pred: 68 %
DLCO unc: 14.85 ml/min/mmHg
FEF 25-75 Post: 2.48 L/sec
FEF 25-75 Pre: 1.9 L/sec
FEF2575-%Change-Post: 30 %
FEF2575-%Pred-Post: 120 %
FEF2575-%Pred-Pre: 92 %
FEV1-%Change-Post: 8 %
FEV1-%Pred-Post: 92 %
FEV1-%Pred-Pre: 85 %
FEV1-Post: 2.09 L
FEV1-Pre: 1.92 L
FEV1FVC-%Change-Post: 3 %
FEV1FVC-%Pred-Pre: 105 %
FEV6-%Change-Post: 5 %
FEV6-%Pred-Post: 87 %
FEV6-%Pred-Pre: 82 %
FEV6-Post: 2.48 L
FEV6-Pre: 2.35 L
FEV6FVC-%Change-Post: 0 %
FEV6FVC-%Pred-Post: 104 %
FEV6FVC-%Pred-Pre: 103 %
FVC-%Change-Post: 5 %
FVC-%Pred-Post: 84 %
FVC-%Pred-Pre: 80 %
FVC-Post: 2.48 L
FVC-Pre: 2.36 L
Post FEV1/FVC ratio: 84 %
Post FEV6/FVC ratio: 100 %
Pre FEV1/FVC ratio: 81 %
Pre FEV6/FVC Ratio: 99 %
RV % pred: 87 %
RV: 1.72 L
TLC % pred: 91 %
TLC: 4.33 L

## 2017-11-10 LAB — COMPREHENSIVE METABOLIC PANEL
ALT: 44 U/L (ref 14–54)
AST: 93 U/L — ABNORMAL HIGH (ref 15–41)
Albumin: 2.5 g/dL — ABNORMAL LOW (ref 3.5–5.0)
Alkaline Phosphatase: 135 U/L — ABNORMAL HIGH (ref 38–126)
Anion gap: 10 (ref 5–15)
BUN: 13 mg/dL (ref 6–20)
CO2: 17 mmol/L — ABNORMAL LOW (ref 22–32)
Calcium: 8.2 mg/dL — ABNORMAL LOW (ref 8.9–10.3)
Chloride: 106 mmol/L (ref 101–111)
Creatinine, Ser: 0.92 mg/dL (ref 0.44–1.00)
GFR calc Af Amer: >60 mL/min (ref >60)
GFR calc non Af Amer: >60 mL/min (ref >60)
Glucose, Bld: 334 mg/dL — ABNORMAL HIGH (ref 65–99)
Potassium: 3.9 mmol/L (ref 3.5–5.1)
Sodium: 133 mmol/L — ABNORMAL LOW (ref 135–145)
Total Bilirubin: 2.1 mg/dL — ABNORMAL HIGH (ref 0.3–1.2)
Total Protein: 5.2 g/dL — ABNORMAL LOW (ref 6.5–8.1)

## 2017-11-10 LAB — URINALYSIS, ROUTINE W REFLEX MICROSCOPIC
Bacteria, UA: NONE SEEN
Bilirubin Urine: NEGATIVE
Glucose, UA: >=500 mg/dL — AB
Ketones, ur: NEGATIVE mg/dL
Leukocytes, UA: NEGATIVE
Nitrite: NEGATIVE
Protein, ur: NEGATIVE mg/dL
Specific Gravity, Urine: 1.022 (ref 1.005–1.030)
pH: 6 (ref 5.0–8.0)

## 2017-11-10 LAB — CBC
HCT: 33.4 % — ABNORMAL LOW (ref 36.0–46.0)
Hemoglobin: 10.2 g/dL — ABNORMAL LOW (ref 12.0–15.0)
MCH: 23.5 pg — ABNORMAL LOW (ref 26.0–34.0)
MCHC: 30.5 g/dL (ref 30.0–36.0)
MCV: 77 fL — ABNORMAL LOW (ref 78.0–100.0)
Platelets: 112 10*3/uL — ABNORMAL LOW (ref 150–400)
RBC: 4.34 MIL/uL (ref 3.87–5.11)
RDW: 22.6 % — ABNORMAL HIGH (ref 11.5–15.5)
WBC: 6.6 10*3/uL (ref 4.0–10.5)

## 2017-11-10 LAB — BLOOD GAS, ARTERIAL
Acid-base deficit: 2.1 mmol/L — ABNORMAL HIGH (ref 0.0–2.0)
Bicarbonate: 21.6 mmol/L (ref 20.0–28.0)
Drawn by: 42180
FIO2: 21
O2 Saturation: 98.3 %
Patient temperature: 98.6
pCO2 arterial: 33.2 mmHg (ref 32.0–48.0)
pH, Arterial: 7.429 (ref 7.350–7.450)
pO2, Arterial: 110 mmHg — ABNORMAL HIGH (ref 83.0–108.0)

## 2017-11-10 LAB — PROTIME-INR
INR: 1.14
Prothrombin Time: 14.5 seconds (ref 11.4–15.2)

## 2017-11-10 LAB — HEMOGLOBIN A1C
Hgb A1c MFr Bld: 7.5 % — ABNORMAL HIGH (ref 4.8–5.6)
Mean Plasma Glucose: 168.55 mg/dL

## 2017-11-10 LAB — APTT: aPTT: 34 seconds (ref 24–36)

## 2017-11-10 LAB — SURGICAL PCR SCREEN
MRSA, PCR: NEGATIVE
Staphylococcus aureus: NEGATIVE

## 2017-11-10 MED ORDER — ALBUTEROL SULFATE (2.5 MG/3ML) 0.083% IN NEBU
2.5000 mg | INHALATION_SOLUTION | Freq: Once | RESPIRATORY_TRACT | Status: AC
  Administered 2017-11-10: 2.5 mg via RESPIRATORY_TRACT

## 2017-11-10 NOTE — Progress Notes (Signed)
Pre-op Cardiac Surgery  Carotid Findings:  1-39% ICA stenosis.  Vertebral artery flow is antegrade.   Upper Extremity Right Left  Brachial Pressures 110T 122T  Radial Waveforms T T  Ulnar Waveforms T T  Palmar Arch (Allen's Test) WNL Doppler signal reverses with radial compression and remains normal with ulnar compression     Findings:      Lower  Extremity Right Left  Dorsalis Pedis    Anterior Tibial 118B 128B  Posterior Tibial 140B 104B  Ankle/Brachial Indices 1.15 1.05    Findings:  WNL

## 2017-11-11 MED ORDER — MILRINONE LACTATE IN DEXTROSE 20-5 MG/100ML-% IV SOLN
0.1250 ug/kg/min | INTRAVENOUS | Status: AC
  Administered 2017-11-12: .3 ug/kg/min via INTRAVENOUS
  Filled 2017-11-11: qty 100

## 2017-11-11 MED ORDER — SODIUM CHLORIDE 0.9 % IV SOLN
INTRAVENOUS | Status: AC
  Administered 2017-11-12: 1.6 [IU]/h via INTRAVENOUS
  Filled 2017-11-11: qty 1

## 2017-11-11 MED ORDER — POTASSIUM CHLORIDE 2 MEQ/ML IV SOLN
80.0000 meq | INTRAVENOUS | Status: DC
  Filled 2017-11-11 (×2): qty 40

## 2017-11-11 MED ORDER — SODIUM CHLORIDE 0.9 % IV SOLN
1.5000 mg/kg/h | INTRAVENOUS | Status: AC
  Administered 2017-11-12: 1.5 mg/kg/h via INTRAVENOUS
  Filled 2017-11-11: qty 25

## 2017-11-11 MED ORDER — SODIUM CHLORIDE 0.9 % IV SOLN
30.0000 ug/min | INTRAVENOUS | Status: AC
  Administered 2017-11-12: 20 ug/min via INTRAVENOUS
  Filled 2017-11-11: qty 2

## 2017-11-11 MED ORDER — DEXMEDETOMIDINE HCL IN NACL 400 MCG/100ML IV SOLN
0.1000 ug/kg/h | INTRAVENOUS | Status: AC
  Administered 2017-11-12: .5 ug/kg/h via INTRAVENOUS
  Filled 2017-11-11: qty 100

## 2017-11-11 MED ORDER — NITROGLYCERIN IN D5W 200-5 MCG/ML-% IV SOLN
2.0000 ug/min | INTRAVENOUS | Status: AC
  Administered 2017-11-12: 16.6 ug/min via INTRAVENOUS
  Filled 2017-11-11: qty 250

## 2017-11-11 MED ORDER — DEXTROSE 5 % IV SOLN
750.0000 mg | INTRAVENOUS | Status: DC
  Filled 2017-11-11: qty 750

## 2017-11-11 MED ORDER — TRANEXAMIC ACID (OHS) BOLUS VIA INFUSION
15.0000 mg/kg | INTRAVENOUS | Status: AC
  Administered 2017-11-12: 1117.5 mg via INTRAVENOUS
  Filled 2017-11-11: qty 1118

## 2017-11-11 MED ORDER — SODIUM CHLORIDE 0.9 % IV SOLN
1250.0000 mg | INTRAVENOUS | Status: DC
  Filled 2017-11-11: qty 1250

## 2017-11-11 MED ORDER — PLASMA-LYTE 148 IV SOLN
INTRAVENOUS | Status: AC
  Administered 2017-11-12: 500 mL
  Filled 2017-11-11: qty 2.5

## 2017-11-11 MED ORDER — TRANEXAMIC ACID (OHS) PUMP PRIME SOLUTION
2.0000 mg/kg | INTRAVENOUS | Status: DC
  Filled 2017-11-11: qty 1.49

## 2017-11-11 MED ORDER — HEPARIN SODIUM (PORCINE) 1000 UNIT/ML IJ SOLN
INTRAMUSCULAR | Status: DC
  Filled 2017-11-11: qty 30

## 2017-11-11 MED ORDER — DOPAMINE-DEXTROSE 3.2-5 MG/ML-% IV SOLN
0.0000 ug/kg/min | INTRAVENOUS | Status: AC
  Administered 2017-11-12: 5 ug/kg/min via INTRAVENOUS
  Filled 2017-11-11: qty 250

## 2017-11-11 MED ORDER — DEXTROSE 5 % IV SOLN
1.5000 g | INTRAVENOUS | Status: DC
  Filled 2017-11-11: qty 1.5

## 2017-11-11 MED ORDER — MAGNESIUM SULFATE 50 % IJ SOLN
40.0000 meq | INTRAMUSCULAR | Status: DC
  Filled 2017-11-11 (×2): qty 9.85

## 2017-11-11 MED ORDER — EPINEPHRINE PF 1 MG/ML IJ SOLN
0.0000 ug/min | INTRAMUSCULAR | Status: DC
  Filled 2017-11-11: qty 4

## 2017-11-12 ENCOUNTER — Inpatient Hospital Stay (HOSPITAL_COMMUNITY): Payer: Medicare Other

## 2017-11-12 ENCOUNTER — Encounter (HOSPITAL_COMMUNITY): Payer: Self-pay | Admitting: *Deleted

## 2017-11-12 ENCOUNTER — Inpatient Hospital Stay (HOSPITAL_COMMUNITY): Payer: Medicare Other | Admitting: Anesthesiology

## 2017-11-12 ENCOUNTER — Inpatient Hospital Stay (HOSPITAL_COMMUNITY)
Admission: RE | Disposition: A | Discharge status: Home Health | Payer: Self-pay | Source: Ambulatory Visit | Attending: Cardiothoracic Surgery

## 2017-11-12 ENCOUNTER — Inpatient Hospital Stay (HOSPITAL_COMMUNITY)
Admission: RE | Admit: 2017-11-12 | Discharge: 2017-11-26 | DRG: 219 | Disposition: A | Discharge status: Home Health | Payer: Medicare Other | Source: Ambulatory Visit | Attending: Cardiothoracic Surgery | Admitting: Cardiothoracic Surgery

## 2017-11-12 DIAGNOSIS — R112 Nausea with vomiting, unspecified: Secondary | ICD-10-CM | POA: Diagnosis present

## 2017-11-12 DIAGNOSIS — Z9689 Presence of other specified functional implants: Secondary | ICD-10-CM

## 2017-11-12 DIAGNOSIS — Z79899 Other long term (current) drug therapy: Secondary | ICD-10-CM

## 2017-11-12 DIAGNOSIS — F329 Major depressive disorder, single episode, unspecified: Secondary | ICD-10-CM | POA: Diagnosis present

## 2017-11-12 DIAGNOSIS — I340 Nonrheumatic mitral (valve) insufficiency: Secondary | ICD-10-CM | POA: Diagnosis not present

## 2017-11-12 DIAGNOSIS — Z978 Presence of other specified devices: Secondary | ICD-10-CM

## 2017-11-12 DIAGNOSIS — E119 Type 2 diabetes mellitus without complications: Secondary | ICD-10-CM | POA: Diagnosis present

## 2017-11-12 DIAGNOSIS — Z7951 Long term (current) use of inhaled steroids: Secondary | ICD-10-CM

## 2017-11-12 DIAGNOSIS — I4891 Unspecified atrial fibrillation: Secondary | ICD-10-CM | POA: Diagnosis present

## 2017-11-12 DIAGNOSIS — K219 Gastro-esophageal reflux disease without esophagitis: Secondary | ICD-10-CM | POA: Diagnosis present

## 2017-11-12 DIAGNOSIS — Z7982 Long term (current) use of aspirin: Secondary | ICD-10-CM | POA: Diagnosis not present

## 2017-11-12 DIAGNOSIS — I272 Pulmonary hypertension, unspecified: Secondary | ICD-10-CM | POA: Diagnosis present

## 2017-11-12 DIAGNOSIS — I052 Rheumatic mitral stenosis with insufficiency: Secondary | ICD-10-CM | POA: Diagnosis present

## 2017-11-12 DIAGNOSIS — R51 Headache: Secondary | ICD-10-CM | POA: Diagnosis present

## 2017-11-12 DIAGNOSIS — K7460 Unspecified cirrhosis of liver: Secondary | ICD-10-CM | POA: Diagnosis present

## 2017-11-12 DIAGNOSIS — I350 Nonrheumatic aortic (valve) stenosis: Secondary | ICD-10-CM

## 2017-11-12 DIAGNOSIS — I8500 Esophageal varices without bleeding: Secondary | ICD-10-CM | POA: Diagnosis present

## 2017-11-12 DIAGNOSIS — I5031 Acute diastolic (congestive) heart failure: Secondary | ICD-10-CM | POA: Diagnosis present

## 2017-11-12 DIAGNOSIS — E871 Hypo-osmolality and hyponatremia: Secondary | ICD-10-CM | POA: Diagnosis present

## 2017-11-12 DIAGNOSIS — I2510 Atherosclerotic heart disease of native coronary artery without angina pectoris: Secondary | ICD-10-CM | POA: Diagnosis present

## 2017-11-12 DIAGNOSIS — D696 Thrombocytopenia, unspecified: Secondary | ICD-10-CM | POA: Diagnosis present

## 2017-11-12 DIAGNOSIS — I050 Rheumatic mitral stenosis: Secondary | ICD-10-CM

## 2017-11-12 DIAGNOSIS — J939 Pneumothorax, unspecified: Secondary | ICD-10-CM

## 2017-11-12 DIAGNOSIS — Z8249 Family history of ischemic heart disease and other diseases of the circulatory system: Secondary | ICD-10-CM

## 2017-11-12 DIAGNOSIS — R4182 Altered mental status, unspecified: Secondary | ICD-10-CM | POA: Diagnosis present

## 2017-11-12 DIAGNOSIS — I080 Rheumatic disorders of both mitral and aortic valves: Principal | ICD-10-CM | POA: Diagnosis present

## 2017-11-12 DIAGNOSIS — E7800 Pure hypercholesterolemia, unspecified: Secondary | ICD-10-CM | POA: Diagnosis present

## 2017-11-12 DIAGNOSIS — Z825 Family history of asthma and other chronic lower respiratory diseases: Secondary | ICD-10-CM | POA: Diagnosis not present

## 2017-11-12 DIAGNOSIS — E039 Hypothyroidism, unspecified: Secondary | ICD-10-CM | POA: Diagnosis present

## 2017-11-12 DIAGNOSIS — D62 Acute posthemorrhagic anemia: Secondary | ICD-10-CM | POA: Diagnosis not present

## 2017-11-12 DIAGNOSIS — Z781 Physical restraint status: Secondary | ICD-10-CM

## 2017-11-12 DIAGNOSIS — I110 Hypertensive heart disease with heart failure: Secondary | ICD-10-CM | POA: Diagnosis present

## 2017-11-12 DIAGNOSIS — I480 Paroxysmal atrial fibrillation: Secondary | ICD-10-CM | POA: Diagnosis not present

## 2017-11-12 DIAGNOSIS — J90 Pleural effusion, not elsewhere classified: Secondary | ICD-10-CM

## 2017-11-12 DIAGNOSIS — Z86010 Personal history of colonic polyps: Secondary | ICD-10-CM

## 2017-11-12 DIAGNOSIS — Z794 Long term (current) use of insulin: Secondary | ICD-10-CM

## 2017-11-12 DIAGNOSIS — R7989 Other specified abnormal findings of blood chemistry: Secondary | ICD-10-CM

## 2017-11-12 DIAGNOSIS — Z09 Encounter for follow-up examination after completed treatment for conditions other than malignant neoplasm: Secondary | ICD-10-CM

## 2017-11-12 DIAGNOSIS — R945 Abnormal results of liver function studies: Secondary | ICD-10-CM

## 2017-11-12 DIAGNOSIS — Z955 Presence of coronary angioplasty implant and graft: Secondary | ICD-10-CM

## 2017-11-12 DIAGNOSIS — I25810 Atherosclerosis of coronary artery bypass graft(s) without angina pectoris: Secondary | ICD-10-CM | POA: Diagnosis present

## 2017-11-12 DIAGNOSIS — I252 Old myocardial infarction: Secondary | ICD-10-CM | POA: Diagnosis not present

## 2017-11-12 DIAGNOSIS — Z952 Presence of prosthetic heart valve: Secondary | ICD-10-CM

## 2017-11-12 HISTORY — PX: AORTIC VALVE REPLACEMENT: SHX41

## 2017-11-12 HISTORY — PX: MITRAL VALVE REPLACEMENT: SHX147

## 2017-11-12 HISTORY — PX: ARTERIAL LINE INSERTION: CATH118227

## 2017-11-12 HISTORY — PX: TEE WITHOUT CARDIOVERSION: SHX5443

## 2017-11-12 LAB — POCT I-STAT 4, (NA,K, GLUC, HGB,HCT)
Glucose, Bld: 155 mg/dL — ABNORMAL HIGH (ref 65–99)
HCT: 28 % — ABNORMAL LOW (ref 36.0–46.0)
Hemoglobin: 9.5 g/dL — ABNORMAL LOW (ref 12.0–15.0)
Potassium: 4.1 mmol/L (ref 3.5–5.1)
Sodium: 143 mmol/L (ref 135–145)

## 2017-11-12 LAB — CBC
HEMATOCRIT: 27.9 % — AB (ref 36.0–46.0)
HEMOGLOBIN: 8.8 g/dL — AB (ref 12.0–15.0)
MCH: 24.6 pg — ABNORMAL LOW (ref 26.0–34.0)
MCHC: 31.5 g/dL (ref 30.0–36.0)
MCV: 78.2 fL (ref 78.0–100.0)
Platelets: 59 10*3/uL — ABNORMAL LOW (ref 150–400)
RBC: 3.57 MIL/uL — AB (ref 3.87–5.11)
RDW: 20.5 % — ABNORMAL HIGH (ref 11.5–15.5)
WBC: 6.5 10*3/uL (ref 4.0–10.5)

## 2017-11-12 LAB — POCT I-STAT, CHEM 8
BUN: 16 mg/dL (ref 6–20)
BUN: 17 mg/dL (ref 6–20)
BUN: 15 mg/dL (ref 6–20)
BUN: 15 mg/dL (ref 6–20)
BUN: 14 mg/dL (ref 6–20)
BUN: 14 mg/dL (ref 6–20)
BUN: 13 mg/dL (ref 6–20)
BUN: 16 mg/dL (ref 6–20)
Calcium, Ion: 1.15 mmol/L (ref 1.15–1.40)
Calcium, Ion: 1.1 mmol/L — ABNORMAL LOW (ref 1.15–1.40)
Calcium, Ion: 1.12 mmol/L — ABNORMAL LOW (ref 1.15–1.40)
Calcium, Ion: 0.95 mmol/L — ABNORMAL LOW (ref 1.15–1.40)
Calcium, Ion: 0.96 mmol/L — ABNORMAL LOW (ref 1.15–1.40)
Calcium, Ion: 0.96 mmol/L — ABNORMAL LOW (ref 1.15–1.40)
Calcium, Ion: 0.93 mmol/L — ABNORMAL LOW (ref 1.15–1.40)
Calcium, Ion: 0.72 mmol/L — CL (ref 1.15–1.40)
Chloride: 105 mmol/L (ref 101–111)
Chloride: 108 mmol/L (ref 101–111)
Chloride: 105 mmol/L (ref 101–111)
Chloride: 105 mmol/L (ref 101–111)
Chloride: 100 mmol/L — ABNORMAL LOW (ref 101–111)
Chloride: 104 mmol/L (ref 101–111)
Chloride: 104 mmol/L (ref 101–111)
Chloride: 104 mmol/L (ref 101–111)
Creatinine, Ser: 0.7 mg/dL (ref 0.44–1.00)
Creatinine, Ser: 0.6 mg/dL (ref 0.44–1.00)
Creatinine, Ser: 0.6 mg/dL (ref 0.44–1.00)
Creatinine, Ser: 0.6 mg/dL (ref 0.44–1.00)
Creatinine, Ser: 0.5 mg/dL (ref 0.44–1.00)
Creatinine, Ser: 0.6 mg/dL (ref 0.44–1.00)
Creatinine, Ser: 0.5 mg/dL (ref 0.44–1.00)
Creatinine, Ser: 0.7 mg/dL (ref 0.44–1.00)
Glucose, Bld: 139 mg/dL — ABNORMAL HIGH (ref 65–99)
Glucose, Bld: 83 mg/dL (ref 65–99)
Glucose, Bld: 72 mg/dL (ref 65–99)
Glucose, Bld: 105 mg/dL — ABNORMAL HIGH (ref 65–99)
Glucose, Bld: 81 mg/dL (ref 65–99)
Glucose, Bld: 93 mg/dL (ref 65–99)
Glucose, Bld: 106 mg/dL — ABNORMAL HIGH (ref 65–99)
Glucose, Bld: 194 mg/dL — ABNORMAL HIGH (ref 65–99)
HCT: 27 % — ABNORMAL LOW (ref 36.0–46.0)
HCT: 23 % — ABNORMAL LOW (ref 36.0–46.0)
HCT: 27 % — ABNORMAL LOW (ref 36.0–46.0)
HCT: 30 % — ABNORMAL LOW (ref 36.0–46.0)
HCT: 25 % — ABNORMAL LOW (ref 36.0–46.0)
HCT: 27 % — ABNORMAL LOW (ref 36.0–46.0)
HCT: 26 % — ABNORMAL LOW (ref 36.0–46.0)
HCT: 26 % — ABNORMAL LOW (ref 36.0–46.0)
Hemoglobin: 9.2 g/dL — ABNORMAL LOW (ref 12.0–15.0)
Hemoglobin: 7.8 g/dL — ABNORMAL LOW (ref 12.0–15.0)
Hemoglobin: 9.2 g/dL — ABNORMAL LOW (ref 12.0–15.0)
Hemoglobin: 10.2 g/dL — ABNORMAL LOW (ref 12.0–15.0)
Hemoglobin: 8.5 g/dL — ABNORMAL LOW (ref 12.0–15.0)
Hemoglobin: 9.2 g/dL — ABNORMAL LOW (ref 12.0–15.0)
Hemoglobin: 8.8 g/dL — ABNORMAL LOW (ref 12.0–15.0)
Hemoglobin: 8.8 g/dL — ABNORMAL LOW (ref 12.0–15.0)
Potassium: 3.6 mmol/L (ref 3.5–5.1)
Potassium: 3.3 mmol/L — ABNORMAL LOW (ref 3.5–5.1)
Potassium: 3.4 mmol/L — ABNORMAL LOW (ref 3.5–5.1)
Potassium: 3.7 mmol/L (ref 3.5–5.1)
Potassium: 4.2 mmol/L (ref 3.5–5.1)
Potassium: 3.8 mmol/L (ref 3.5–5.1)
Potassium: 4.4 mmol/L (ref 3.5–5.1)
Potassium: 4.2 mmol/L (ref 3.5–5.1)
Sodium: 140 mmol/L (ref 135–145)
Sodium: 141 mmol/L (ref 135–145)
Sodium: 143 mmol/L (ref 135–145)
Sodium: 142 mmol/L (ref 135–145)
Sodium: 139 mmol/L (ref 135–145)
Sodium: 140 mmol/L (ref 135–145)
Sodium: 140 mmol/L (ref 135–145)
Sodium: 142 mmol/L (ref 135–145)
TCO2: 29 mmol/L (ref 22–32)
TCO2: 31 mmol/L (ref 22–32)
TCO2: 28 mmol/L (ref 22–32)
TCO2: 27 mmol/L (ref 22–32)
TCO2: 28 mmol/L (ref 22–32)
TCO2: 26 mmol/L (ref 22–32)
TCO2: 26 mmol/L (ref 22–32)
TCO2: 24 mmol/L (ref 22–32)

## 2017-11-12 LAB — POCT I-STAT 3, ART BLOOD GAS (G3+)
Acid-Base Excess: 3 mmol/L — ABNORMAL HIGH (ref 0.0–2.0)
Acid-Base Excess: 2 mmol/L (ref 0.0–2.0)
Acid-Base Excess: 3 mmol/L — ABNORMAL HIGH (ref 0.0–2.0)
Acid-base deficit: 2 mmol/L (ref 0.0–2.0)
Bicarbonate: 26.4 mmol/L (ref 20.0–28.0)
Bicarbonate: 27.1 mmol/L (ref 20.0–28.0)
Bicarbonate: 26.4 mmol/L (ref 20.0–28.0)
Bicarbonate: 24.1 mmol/L (ref 20.0–28.0)
Bicarbonate: 23.7 mmol/L (ref 20.0–28.0)
O2 Saturation: 100 %
O2 Saturation: 100 %
O2 Saturation: 100 %
O2 Saturation: 100 %
O2 Saturation: 95 %
TCO2: 27 mmol/L (ref 22–32)
TCO2: 29 mmol/L (ref 22–32)
TCO2: 27 mmol/L (ref 22–32)
TCO2: 25 mmol/L (ref 22–32)
TCO2: 25 mmol/L (ref 22–32)
pCO2 arterial: 33.7 mmHg (ref 32.0–48.0)
pCO2 arterial: 46.4 mmHg (ref 32.0–48.0)
pCO2 arterial: 35.5 mmHg (ref 32.0–48.0)
pCO2 arterial: 36.5 mmHg (ref 32.0–48.0)
pCO2 arterial: 43.5 mmHg (ref 32.0–48.0)
pH, Arterial: 7.502 — ABNORMAL HIGH (ref 7.350–7.450)
pH, Arterial: 7.374 (ref 7.350–7.450)
pH, Arterial: 7.479 — ABNORMAL HIGH (ref 7.350–7.450)
pH, Arterial: 7.428 (ref 7.350–7.450)
pH, Arterial: 7.343 — ABNORMAL LOW (ref 7.350–7.450)
pO2, Arterial: 326 mmHg — ABNORMAL HIGH (ref 83.0–108.0)
pO2, Arterial: 381 mmHg — ABNORMAL HIGH (ref 83.0–108.0)
pO2, Arterial: 338 mmHg — ABNORMAL HIGH (ref 83.0–108.0)
pO2, Arterial: 339 mmHg — ABNORMAL HIGH (ref 83.0–108.0)
pO2, Arterial: 79 mmHg — ABNORMAL LOW (ref 83.0–108.0)

## 2017-11-12 LAB — GLUCOSE, CAPILLARY
Glucose-Capillary: 102 mg/dL — ABNORMAL HIGH (ref 65–99)
Glucose-Capillary: 142 mg/dL — ABNORMAL HIGH (ref 65–99)
Glucose-Capillary: 150 mg/dL — ABNORMAL HIGH (ref 65–99)
Glucose-Capillary: 154 mg/dL — ABNORMAL HIGH (ref 65–99)
Glucose-Capillary: 150 mg/dL — ABNORMAL HIGH (ref 65–99)

## 2017-11-12 LAB — PLATELET COUNT: Platelets: 52 10*3/uL — ABNORMAL LOW (ref 150–400)

## 2017-11-12 LAB — FIBRINOGEN: Fibrinogen: 179 mg/dL — ABNORMAL LOW (ref 210–475)

## 2017-11-12 LAB — PROTIME-INR
INR: 1.68
PROTHROMBIN TIME: 19.7 s — AB (ref 11.4–15.2)

## 2017-11-12 LAB — APTT: APTT: 42 s — AB (ref 24–36)

## 2017-11-12 LAB — PREPARE RBC (CROSSMATCH)

## 2017-11-12 LAB — HEMOGLOBIN AND HEMATOCRIT, BLOOD
HCT: 25.9 % — ABNORMAL LOW (ref 36.0–46.0)
Hemoglobin: 8.3 g/dL — ABNORMAL LOW (ref 12.0–15.0)

## 2017-11-12 SURGERY — REDO STERNOTOMY
Anesthesia: General | Site: Groin | Laterality: Right

## 2017-11-12 MED ORDER — OXYCODONE HCL 5 MG PO TABS
5.0000 mg | ORAL_TABLET | ORAL | Status: DC | PRN
  Administered 2017-11-14 – 2017-11-21 (×7): 5 mg via ORAL
  Filled 2017-11-12 (×8): qty 1

## 2017-11-12 MED ORDER — ARTIFICIAL TEARS OPHTHALMIC OINT
TOPICAL_OINTMENT | OPHTHALMIC | Status: DC | PRN
  Administered 2017-11-12: 1 via OPHTHALMIC

## 2017-11-12 MED ORDER — THROMBIN (RECOMBINANT) 5000 UNITS EX SOLR
CUTANEOUS | Status: AC
  Filled 2017-11-12: qty 5000

## 2017-11-12 MED ORDER — PROPOFOL 10 MG/ML IV BOLUS
INTRAVENOUS | Status: AC
  Filled 2017-11-12: qty 20

## 2017-11-12 MED ORDER — DULOXETINE HCL 30 MG PO CPEP
90.0000 mg | ORAL_CAPSULE | Freq: Every day | ORAL | Status: DC
  Administered 2017-11-13 – 2017-11-25 (×13): 90 mg via ORAL
  Filled 2017-11-12 (×14): qty 3

## 2017-11-12 MED ORDER — ORAL CARE MOUTH RINSE
15.0000 mL | OROMUCOSAL | Status: DC
  Administered 2017-11-12 – 2017-11-13 (×6): 15 mL via OROMUCOSAL

## 2017-11-12 MED ORDER — VANCOMYCIN HCL 1000 MG IV SOLR
INTRAVENOUS | Status: DC | PRN
  Administered 2017-11-12: 1250 mg via INTRAVENOUS

## 2017-11-12 MED ORDER — SODIUM CHLORIDE 0.45 % IV SOLN: INTRAVENOUS | Status: DC | PRN

## 2017-11-12 MED ORDER — SODIUM CHLORIDE 0.9% FLUSH: 3.0000 mL | INTRAVENOUS | Status: DC | PRN

## 2017-11-12 MED ORDER — CHLORHEXIDINE GLUCONATE 4 % EX LIQD: 30.0000 mL | CUTANEOUS | Status: DC

## 2017-11-12 MED ORDER — CHLORHEXIDINE GLUCONATE CLOTH 2 % EX PADS
6.0000 | MEDICATED_PAD | Freq: Every day | CUTANEOUS | Status: DC
  Administered 2017-11-13 – 2017-11-17 (×4): 6 via TOPICAL

## 2017-11-12 MED ORDER — PANTOPRAZOLE SODIUM 40 MG PO TBEC: 40.0000 mg | DELAYED_RELEASE_TABLET | Freq: Every day | ORAL | Status: DC

## 2017-11-12 MED ORDER — ALBUMIN HUMAN 5 % IV SOLN
INTRAVENOUS | Status: DC | PRN
  Administered 2017-11-12: 17:00:00 via INTRAVENOUS

## 2017-11-12 MED ORDER — PHENYLEPHRINE HCL 10 MG/ML IJ SOLN
INTRAMUSCULAR | Status: DC | PRN
  Administered 2017-11-12: 40 ug via INTRAVENOUS
  Administered 2017-11-12: 120 ug via INTRAVENOUS
  Administered 2017-11-12 (×3): 40 ug via INTRAVENOUS
  Administered 2017-11-12: 200 ug via INTRAVENOUS
  Administered 2017-11-12: 80 ug via INTRAVENOUS

## 2017-11-12 MED ORDER — SODIUM CHLORIDE 0.9 % IV SOLN
INTRAVENOUS | Status: DC
  Administered 2017-11-13: 15:00:00 via INTRAVENOUS
  Filled 2017-11-12 (×2): qty 1

## 2017-11-12 MED ORDER — LACTATED RINGERS IV SOLN
INTRAVENOUS | Status: DC | PRN
  Administered 2017-11-12: 09:00:00 via INTRAVENOUS

## 2017-11-12 MED ORDER — LEVOTHYROXINE SODIUM 50 MCG PO TABS
50.0000 ug | ORAL_TABLET | Freq: Every day | ORAL | Status: DC
  Administered 2017-11-13 – 2017-11-26 (×14): 50 ug via ORAL
  Filled 2017-11-12 (×14): qty 1

## 2017-11-12 MED ORDER — HEPARIN SODIUM (PORCINE) 1000 UNIT/ML IJ SOLN
INTRAMUSCULAR | Status: DC | PRN
  Administered 2017-11-12: 25000 [IU] via INTRAVENOUS

## 2017-11-12 MED ORDER — INSULIN REGULAR BOLUS VIA INFUSION
0.0000 [IU] | Freq: Three times a day (TID) | INTRAVENOUS | Status: DC
  Filled 2017-11-12: qty 10

## 2017-11-12 MED ORDER — MORPHINE SULFATE (PF) 4 MG/ML IV SOLN
1.0000 mg | INTRAVENOUS | Status: AC | PRN
  Administered 2017-11-13: 2 mg via INTRAVENOUS
  Filled 2017-11-12: qty 1

## 2017-11-12 MED ORDER — ATORVASTATIN CALCIUM 40 MG PO TABS
40.0000 mg | ORAL_TABLET | Freq: Every day | ORAL | Status: DC
  Administered 2017-11-13 – 2017-11-17 (×5): 40 mg via ORAL
  Filled 2017-11-12 (×5): qty 1

## 2017-11-12 MED ORDER — DEXMEDETOMIDINE HCL IN NACL 400 MCG/100ML IV SOLN
0.0000 ug/kg/h | INTRAVENOUS | Status: DC
  Administered 2017-11-12: 0.7 ug/kg/h via INTRAVENOUS
  Filled 2017-11-12: qty 100

## 2017-11-12 MED ORDER — METOPROLOL TARTRATE 25 MG/10 ML ORAL SUSPENSION: 12.5000 mg | Freq: Two times a day (BID) | ORAL | Status: DC

## 2017-11-12 MED ORDER — BISACODYL 5 MG PO TBEC
10.0000 mg | DELAYED_RELEASE_TABLET | Freq: Every day | ORAL | Status: DC
  Administered 2017-11-13 – 2017-11-14 (×2): 10 mg via ORAL
  Filled 2017-11-12 (×2): qty 2

## 2017-11-12 MED ORDER — FENTANYL CITRATE (PF) 250 MCG/5ML IJ SOLN
INTRAMUSCULAR | Status: AC
  Filled 2017-11-12: qty 25

## 2017-11-12 MED ORDER — ASPIRIN EC 325 MG PO TBEC
325.0000 mg | DELAYED_RELEASE_TABLET | Freq: Every day | ORAL | Status: DC
  Administered 2017-11-14: 325 mg via ORAL
  Filled 2017-11-12: qty 1

## 2017-11-12 MED ORDER — MORPHINE SULFATE (PF) 2 MG/ML IV SOLN: 2.0000 mg | INTRAVENOUS | Status: DC | PRN

## 2017-11-12 MED ORDER — PROPOFOL 10 MG/ML IV BOLUS
INTRAVENOUS | Status: DC | PRN
  Administered 2017-11-12: 30 mg via INTRAVENOUS

## 2017-11-12 MED ORDER — ROCURONIUM BROMIDE 10 MG/ML (PF) SYRINGE
PREFILLED_SYRINGE | INTRAVENOUS | Status: AC
  Filled 2017-11-12: qty 15

## 2017-11-12 MED ORDER — 0.9 % SODIUM CHLORIDE (POUR BTL) OPTIME
TOPICAL | Status: DC | PRN
  Administered 2017-11-12: 2000 mL
  Administered 2017-11-12: 5000 mL

## 2017-11-12 MED ORDER — SODIUM CHLORIDE 0.9% FLUSH: 10.0000 mL | INTRAVENOUS | Status: DC | PRN

## 2017-11-12 MED ORDER — LACTATED RINGERS IV SOLN
INTRAVENOUS | Status: DC | PRN
  Administered 2017-11-12: 08:00:00 via INTRAVENOUS

## 2017-11-12 MED ORDER — DEXTROSE 5 % IV SOLN
INTRAVENOUS | Status: DC | PRN
  Administered 2017-11-12: 1.5 g via INTRAVENOUS
  Administered 2017-11-12: .75 g via INTRAVENOUS

## 2017-11-12 MED ORDER — MIDAZOLAM HCL 5 MG/5ML IJ SOLN
INTRAMUSCULAR | Status: DC | PRN
  Administered 2017-11-12: 1 mg via INTRAVENOUS
  Administered 2017-11-12: 4 mg via INTRAVENOUS

## 2017-11-12 MED ORDER — METOPROLOL TARTRATE 12.5 MG HALF TABLET
12.5000 mg | ORAL_TABLET | Freq: Two times a day (BID) | ORAL | Status: DC
  Administered 2017-11-13: 12.5 mg via ORAL
  Filled 2017-11-12 (×2): qty 1

## 2017-11-12 MED ORDER — LACTATED RINGERS IV SOLN
INTRAVENOUS | Status: DC
  Administered 2017-11-15: 04:00:00 via INTRAVENOUS

## 2017-11-12 MED ORDER — MORPHINE SULFATE (PF) 2 MG/ML IV SOLN: 1.0000 mg | INTRAVENOUS | Status: DC | PRN

## 2017-11-12 MED ORDER — MOMETASONE FURO-FORMOTEROL FUM 100-5 MCG/ACT IN AERO: 2.0000 | INHALATION_SPRAY | Freq: Two times a day (BID) | RESPIRATORY_TRACT | Status: DC | PRN

## 2017-11-12 MED ORDER — ROCURONIUM BROMIDE 100 MG/10ML IV SOLN
INTRAVENOUS | Status: DC | PRN
  Administered 2017-11-12: 60 mg via INTRAVENOUS
  Administered 2017-11-12 (×2): 40 mg via INTRAVENOUS
  Administered 2017-11-12: 60 mg via INTRAVENOUS

## 2017-11-12 MED ORDER — SODIUM CHLORIDE 0.9 % IV SOLN: 250.0000 mL | INTRAVENOUS | Status: DC

## 2017-11-12 MED ORDER — HEMOSTATIC AGENTS (NO CHARGE) OPTIME
TOPICAL | Status: DC | PRN
  Administered 2017-11-12 (×5): 1 via TOPICAL

## 2017-11-12 MED ORDER — MIDAZOLAM HCL 2 MG/2ML IJ SOLN: 2.0000 mg | INTRAMUSCULAR | Status: DC | PRN

## 2017-11-12 MED ORDER — CHLORHEXIDINE GLUCONATE 0.12 % MT SOLN
15.0000 mL | Freq: Once | OROMUCOSAL | Status: AC
  Administered 2017-11-12: 15 mL via OROMUCOSAL
  Filled 2017-11-12: qty 15

## 2017-11-12 MED ORDER — ASPIRIN 81 MG PO CHEW
324.0000 mg | CHEWABLE_TABLET | Freq: Every day | ORAL | Status: DC
  Administered 2017-11-13 – 2017-11-16 (×3): 324 mg
  Filled 2017-11-12 (×2): qty 4

## 2017-11-12 MED ORDER — PROTAMINE SULFATE 10 MG/ML IV SOLN
INTRAVENOUS | Status: AC
  Filled 2017-11-12: qty 25

## 2017-11-12 MED ORDER — LACTATED RINGERS IV SOLN
INTRAVENOUS | Status: DC
  Administered 2017-11-12: 20 mL via INTRAVENOUS
  Administered 2017-11-12: 19:00:00 via INTRAVENOUS

## 2017-11-12 MED ORDER — SODIUM CHLORIDE 0.9 % IV SOLN: INTRAVENOUS | Status: DC

## 2017-11-12 MED ORDER — LACTATED RINGERS IV SOLN: 500.0000 mL | Freq: Once | INTRAVENOUS | Status: DC | PRN

## 2017-11-12 MED ORDER — POTASSIUM CHLORIDE 10 MEQ/50ML IV SOLN: 10.0000 meq | INTRAVENOUS | Status: AC

## 2017-11-12 MED ORDER — DEXMEDETOMIDINE HCL IN NACL 200 MCG/50ML IV SOLN
INTRAVENOUS | Status: AC
  Filled 2017-11-12: qty 50

## 2017-11-12 MED ORDER — MAGNESIUM SULFATE 4 GM/100ML IV SOLN
4.0000 g | Freq: Once | INTRAVENOUS | Status: AC
  Administered 2017-11-12: 4 g via INTRAVENOUS
  Filled 2017-11-12: qty 100

## 2017-11-12 MED ORDER — CHLORHEXIDINE GLUCONATE 0.12% ORAL RINSE (MEDLINE KIT)
15.0000 mL | Freq: Two times a day (BID) | OROMUCOSAL | Status: DC
  Administered 2017-11-13: 15 mL via OROMUCOSAL

## 2017-11-12 MED ORDER — FERROUS SULFATE 325 (65 FE) MG PO TABS
325.0000 mg | ORAL_TABLET | Freq: Every day | ORAL | Status: DC
  Administered 2017-11-14 – 2017-11-25 (×12): 325 mg via ORAL
  Filled 2017-11-12 (×12): qty 1

## 2017-11-12 MED ORDER — SODIUM CHLORIDE 0.9% FLUSH
10.0000 mL | Freq: Two times a day (BID) | INTRAVENOUS | Status: DC
  Administered 2017-11-13 – 2017-11-17 (×8): 10 mL

## 2017-11-12 MED ORDER — MILRINONE LACTATE IN DEXTROSE 20-5 MG/100ML-% IV SOLN
0.3000 ug/kg/min | INTRAVENOUS | Status: DC
  Administered 2017-11-12 – 2017-11-14 (×4): 0.3 ug/kg/min via INTRAVENOUS
  Filled 2017-11-12 (×3): qty 100

## 2017-11-12 MED ORDER — CHLORHEXIDINE GLUCONATE 0.12 % MT SOLN
15.0000 mL | OROMUCOSAL | Status: AC
  Administered 2017-11-12: 15 mL via OROMUCOSAL

## 2017-11-12 MED ORDER — PROTAMINE SULFATE 10 MG/ML IV SOLN
INTRAVENOUS | Status: DC | PRN
  Administered 2017-11-12: 50 mg via INTRAVENOUS
  Administered 2017-11-12: 150 mg via INTRAVENOUS
  Administered 2017-11-12: 50 mg via INTRAVENOUS

## 2017-11-12 MED ORDER — SODIUM CHLORIDE 0.9 % IV SOLN: Freq: Once | INTRAVENOUS | Status: DC

## 2017-11-12 MED ORDER — GELATIN ABSORBABLE MT POWD
OROMUCOSAL | Status: DC | PRN
  Administered 2017-11-12 (×4): 4 mL via TOPICAL

## 2017-11-12 MED ORDER — PHENYLEPHRINE HCL 10 MG/ML IJ SOLN
0.0000 ug/min | INTRAMUSCULAR | Status: DC
  Administered 2017-11-13: 40 ug/min via INTRAVENOUS
  Administered 2017-11-13: 60 ug/min via INTRAVENOUS
  Administered 2017-11-13 (×2): 75 ug/min via INTRAVENOUS
  Administered 2017-11-13: 85 ug/min via INTRAVENOUS
  Administered 2017-11-14: 80 ug/min via INTRAVENOUS
  Filled 2017-11-12 (×8): qty 2

## 2017-11-12 MED ORDER — ALBUMIN HUMAN 5 % IV SOLN
250.0000 mL | INTRAVENOUS | Status: AC | PRN
  Administered 2017-11-12 – 2017-11-13 (×2): 250 mL via INTRAVENOUS

## 2017-11-12 MED ORDER — METOPROLOL TARTRATE 5 MG/5ML IV SOLN: 2.5000 mg | INTRAVENOUS | Status: DC | PRN

## 2017-11-12 MED ORDER — TRAMADOL HCL 50 MG PO TABS
50.0000 mg | ORAL_TABLET | ORAL | Status: DC | PRN
  Administered 2017-11-13: 50 mg via ORAL
  Administered 2017-11-14: 100 mg via ORAL
  Administered 2017-11-14: 50 mg via ORAL
  Filled 2017-11-12 (×2): qty 1
  Filled 2017-11-12: qty 2

## 2017-11-12 MED ORDER — TRANEXAMIC ACID 1000 MG/10ML IV SOLN
1000.0000 mg | INTRAVENOUS | Status: DC
  Filled 2017-11-12 (×2): qty 10

## 2017-11-12 MED ORDER — VANCOMYCIN HCL IN DEXTROSE 1-5 GM/200ML-% IV SOLN
1000.0000 mg | Freq: Once | INTRAVENOUS | Status: AC
  Administered 2017-11-13: 1000 mg via INTRAVENOUS
  Filled 2017-11-12: qty 200

## 2017-11-12 MED ORDER — SODIUM CHLORIDE 0.9% FLUSH
3.0000 mL | Freq: Two times a day (BID) | INTRAVENOUS | Status: DC
  Administered 2017-11-13 – 2017-11-21 (×14): 3 mL via INTRAVENOUS

## 2017-11-12 MED ORDER — MIDAZOLAM HCL 10 MG/2ML IJ SOLN
INTRAMUSCULAR | Status: AC
  Filled 2017-11-12: qty 2

## 2017-11-12 MED ORDER — METOPROLOL TARTRATE 12.5 MG HALF TABLET
12.5000 mg | ORAL_TABLET | Freq: Once | ORAL | Status: AC
  Administered 2017-11-12: 12.5 mg via ORAL
  Filled 2017-11-12: qty 1

## 2017-11-12 MED ORDER — LACTATED RINGERS IV SOLN
INTRAVENOUS | Status: DC | PRN
  Administered 2017-11-12 (×2): via INTRAVENOUS

## 2017-11-12 MED ORDER — PANTOPRAZOLE SODIUM 40 MG PO TBEC
40.0000 mg | DELAYED_RELEASE_TABLET | Freq: Every day | ORAL | Status: DC
  Administered 2017-11-14 – 2017-11-21 (×8): 40 mg via ORAL
  Filled 2017-11-12 (×8): qty 1

## 2017-11-12 MED ORDER — CEFUROXIME SODIUM 1.5 G IV SOLR
1.5000 g | Freq: Two times a day (BID) | INTRAVENOUS | Status: AC
  Administered 2017-11-12 – 2017-11-14 (×4): 1.5 g via INTRAVENOUS
  Filled 2017-11-12 (×4): qty 1.5

## 2017-11-12 MED ORDER — ONDANSETRON HCL 4 MG/2ML IJ SOLN
4.0000 mg | Freq: Four times a day (QID) | INTRAMUSCULAR | Status: DC | PRN
  Administered 2017-11-17 – 2017-11-19 (×2): 4 mg via INTRAVENOUS
  Filled 2017-11-12 (×2): qty 2

## 2017-11-12 MED ORDER — FENTANYL CITRATE (PF) 250 MCG/5ML IJ SOLN
INTRAMUSCULAR | Status: DC | PRN
  Administered 2017-11-12 (×2): 150 ug via INTRAVENOUS
  Administered 2017-11-12: 100 ug via INTRAVENOUS
  Administered 2017-11-12: 50 ug via INTRAVENOUS
  Administered 2017-11-12 (×2): 100 ug via INTRAVENOUS
  Administered 2017-11-12: 150 ug via INTRAVENOUS
  Administered 2017-11-12: 250 ug via INTRAVENOUS
  Administered 2017-11-12: 200 ug via INTRAVENOUS

## 2017-11-12 MED ORDER — SODIUM CHLORIDE 0.9 % IV SOLN
0.0000 ug/kg/h | INTRAVENOUS | Status: DC
  Filled 2017-11-12: qty 2

## 2017-11-12 MED ORDER — FAMOTIDINE IN NACL 20-0.9 MG/50ML-% IV SOLN
20.0000 mg | Freq: Two times a day (BID) | INTRAVENOUS | Status: AC
  Administered 2017-11-12 – 2017-11-13 (×2): 20 mg via INTRAVENOUS
  Filled 2017-11-12: qty 50

## 2017-11-12 MED ORDER — PHENYLEPHRINE 40 MCG/ML (10ML) SYRINGE FOR IV PUSH (FOR BLOOD PRESSURE SUPPORT)
PREFILLED_SYRINGE | INTRAVENOUS | Status: AC
  Filled 2017-11-12: qty 20

## 2017-11-12 MED ORDER — MORPHINE SULFATE (PF) 4 MG/ML IV SOLN
2.0000 mg | INTRAVENOUS | Status: DC | PRN
  Administered 2017-11-13 (×2): 2 mg via INTRAVENOUS
  Administered 2017-11-13: 4 mg via INTRAVENOUS
  Administered 2017-11-14: 5 mg via INTRAVENOUS
  Administered 2017-11-14: 2 mg via INTRAVENOUS
  Filled 2017-11-12 (×3): qty 1
  Filled 2017-11-12: qty 2
  Filled 2017-11-12: qty 1

## 2017-11-12 MED ORDER — NITROGLYCERIN IN D5W 200-5 MCG/ML-% IV SOLN: 0.0000 ug/min | INTRAVENOUS | Status: DC

## 2017-11-12 MED ORDER — DOCUSATE SODIUM 100 MG PO CAPS
200.0000 mg | ORAL_CAPSULE | Freq: Every day | ORAL | Status: DC
  Administered 2017-11-13 – 2017-11-14 (×2): 200 mg via ORAL
  Filled 2017-11-12 (×2): qty 2

## 2017-11-12 MED ORDER — DOPAMINE-DEXTROSE 3.2-5 MG/ML-% IV SOLN
0.0000 ug/kg/min | INTRAVENOUS | Status: DC
  Administered 2017-11-12 – 2017-11-14 (×2): 3 ug/kg/min via INTRAVENOUS
  Filled 2017-11-12: qty 250

## 2017-11-12 MED ORDER — BISACODYL 10 MG RE SUPP: 10.0000 mg | Freq: Every day | RECTAL | Status: DC

## 2017-11-12 SURGICAL SUPPLY — 138 items
ADAPTER CARDIO PERF ANTE/RETRO (ADAPTER) ×3 IMPLANT
APPLICATOR COTTON TIP 6IN STRL (MISCELLANEOUS) IMPLANT
BAG DECANTER FOR FLEXI CONT (MISCELLANEOUS) ×3 IMPLANT
BANDAGE ACE 4X5 VEL STRL LF (GAUZE/BANDAGES/DRESSINGS) IMPLANT
BANDAGE ACE 6X5 VEL STRL LF (GAUZE/BANDAGES/DRESSINGS) IMPLANT
BENZOIN TINCTURE PRP APPL 2/3 (GAUZE/BANDAGES/DRESSINGS) ×6 IMPLANT
BLADE CORE FAN STRYKER (BLADE) ×3 IMPLANT
BLADE NEEDLE 3 SS STRL (BLADE) ×3 IMPLANT
BLADE STERNUM SYSTEM 6 (BLADE) IMPLANT
BLADE SURG 15 STRL LF DISP TIS (BLADE) ×2 IMPLANT
BLADE SURG 15 STRL SS (BLADE) ×1
BNDG GAUZE ELAST 4 BULKY (GAUZE/BANDAGES/DRESSINGS) IMPLANT
BOOT SUTURE AID YELLOW STND (SUTURE) IMPLANT
CANISTER SUCT 3000ML PPV (MISCELLANEOUS) ×3 IMPLANT
CANN PRFSN .5XCNCT 15X34-48 (MISCELLANEOUS) ×2
CANN PRFSN 3/8X14X24FR PCFC (MISCELLANEOUS) ×2
CANN PRFSN 3/8XCNCT ST RT ANG (MISCELLANEOUS) ×2
CANN PRFSN 3/8XRT ANG TPR 14 (MISCELLANEOUS) ×2
CANNULA ARTERIAL NVNT 3/8 22FR (MISCELLANEOUS) IMPLANT
CANNULA GUNDRY RCSP 15FR (MISCELLANEOUS) ×3 IMPLANT
CANNULA PRFSN .5XCNCT 15X34-48 (MISCELLANEOUS) ×2 IMPLANT
CANNULA PRFSN 3/8X14X24FR PCFC (MISCELLANEOUS) ×2 IMPLANT
CANNULA PRFSN 3/8XCNCT RT ANG (MISCELLANEOUS) ×2 IMPLANT
CANNULA PRFSN 3/8XRT ANG TPR14 (MISCELLANEOUS) ×2 IMPLANT
CANNULA VEN 2 STAGE (MISCELLANEOUS) ×1
CANNULA VEN MTL TIP RT (MISCELLANEOUS) ×3
CATH CPB KIT GERHARDT (MISCELLANEOUS) ×3 IMPLANT
CATH HEART VENT LEFT (CATHETERS) ×2 IMPLANT
CATH ROBINSON RED A/P 18FR (CATHETERS) IMPLANT
CATH THORACIC 28FR (CATHETERS) ×3 IMPLANT
CATH/SQUID NICHOLS JEHLE COR (CATHETERS) ×3 IMPLANT
CLIP FOGARTY SPRING 6M (CLIP) IMPLANT
CONN 1/2X1/2X1/2  BEN (MISCELLANEOUS) ×1
CONN 1/2X1/2X1/2 BEN (MISCELLANEOUS) ×2 IMPLANT
CONN 1/2X3/8X3/8 Y GISH (MISCELLANEOUS) ×3 IMPLANT
CONN 3/8X1/2 ST GISH (MISCELLANEOUS) ×6 IMPLANT
CONN ST 1/4X3/8  BEN (MISCELLANEOUS) ×1
CONN ST 1/4X3/8 BEN (MISCELLANEOUS) ×2 IMPLANT
CONN Y 3/8X3/8X3/8  BEN (MISCELLANEOUS)
CONN Y 3/8X3/8X3/8 BEN (MISCELLANEOUS) IMPLANT
CONT SPEC 4OZ CLIKSEAL STRL BL (MISCELLANEOUS) ×3 IMPLANT
COVER PROBE W GEL 5X96 (DRAPES) IMPLANT
CRADLE DONUT ADULT HEAD (MISCELLANEOUS) ×3 IMPLANT
DEVICE SUT CK QUICK LOAD INDV (Prosthesis & Implant Heart) ×6 IMPLANT
DRAIN CHANNEL 28F RND 3/8 FF (WOUND CARE) ×6 IMPLANT
DRAPE CARDIOVASCULAR INCISE (DRAPES) ×1
DRAPE SLUSH/WARMER DISC (DRAPES) ×3 IMPLANT
DRAPE SRG 135X102X78XABS (DRAPES) ×2 IMPLANT
DRESSING PREVENA PLUS CUSTOM (GAUZE/BANDAGES/DRESSINGS) ×4 IMPLANT
DRSG AQUACEL AG ADV 3.5X14 (GAUZE/BANDAGES/DRESSINGS) IMPLANT
DRSG PREVENA PLUS CUSTOM (GAUZE/BANDAGES/DRESSINGS) ×6
ELECT BLADE 4.0 EZ CLEAN MEGAD (MISCELLANEOUS) ×3
ELECT CAUTERY BLADE 6.4 (BLADE) ×3 IMPLANT
ELECT REM PT RETURN 9FT ADLT (ELECTROSURGICAL) ×6
ELECTRODE BLDE 4.0 EZ CLN MEGD (MISCELLANEOUS) ×2 IMPLANT
ELECTRODE REM PT RTRN 9FT ADLT (ELECTROSURGICAL) ×4 IMPLANT
FELT TEFLON 1X6 (MISCELLANEOUS) ×6 IMPLANT
GAUZE SPONGE 4X4 12PLY STRL (GAUZE/BANDAGES/DRESSINGS) ×3 IMPLANT
GLOVE BIO SURGEON STRL SZ 6 (GLOVE) IMPLANT
GLOVE BIO SURGEON STRL SZ 6.5 (GLOVE) ×12 IMPLANT
GLOVE BIO SURGEON STRL SZ7 (GLOVE) IMPLANT
GLOVE BIO SURGEON STRL SZ7.5 (GLOVE) ×3 IMPLANT
GOWN STRL REUS W/ TWL LRG LVL3 (GOWN DISPOSABLE) ×8 IMPLANT
GOWN STRL REUS W/TWL LRG LVL3 (GOWN DISPOSABLE) ×4
HEMOSTAT POWDER SURGIFOAM 1G (HEMOSTASIS) ×9 IMPLANT
HEMOSTAT SURGICEL 2X14 (HEMOSTASIS) ×6 IMPLANT
HEMOSTAT SURGICEL 2X4 FIBR (HEMOSTASIS) ×3 IMPLANT
INSERT FOGARTY XLG (MISCELLANEOUS) IMPLANT
IV CATH 18G X1.75 CATHLON (IV SOLUTION) ×3 IMPLANT
KIT BASIN OR (CUSTOM PROCEDURE TRAY) ×3 IMPLANT
KIT CATH SUCT 8FR (CATHETERS) ×3 IMPLANT
KIT DRSG PREVENA PLUS 7DAY 125 (MISCELLANEOUS) ×3 IMPLANT
KIT PREVENA INCISION MGT20CM45 (CANNISTER) ×3 IMPLANT
KIT ROOM TURNOVER OR (KITS) ×3 IMPLANT
KIT SUCTION CATH 14FR (SUCTIONS) ×9 IMPLANT
KIT SUT CK MINI COMBO 4X17 (Prosthesis & Implant Heart) ×3 IMPLANT
KIT VASOVIEW HEMOPRO VH 3000 (KITS) IMPLANT
LEAD PACING MYOCARDI (MISCELLANEOUS) ×3 IMPLANT
LINE VENT (MISCELLANEOUS) ×3 IMPLANT
LOOP VESSEL SUPERMAXI WHITE (MISCELLANEOUS) ×3 IMPLANT
NS IRRIG 1000ML POUR BTL (IV SOLUTION) ×21 IMPLANT
PACK E OPEN HEART (SUTURE) ×3 IMPLANT
PACK OPEN HEART (CUSTOM PROCEDURE TRAY) ×3 IMPLANT
PAD ARMBOARD 7.5X6 YLW CONV (MISCELLANEOUS) ×6 IMPLANT
PAD ELECT DEFIB RADIOL ZOLL (MISCELLANEOUS) ×3 IMPLANT
PENCIL BUTTON HOLSTER BLD 10FT (ELECTRODE) ×3 IMPLANT
SET CARDIOPLEGIA MPS 5001102 (MISCELLANEOUS) ×3 IMPLANT
SPOGE SURGIFLO 8M (HEMOSTASIS) ×1
SPONGE SURGIFLO 8M (HEMOSTASIS) ×2 IMPLANT
STOPCOCK 4 WAY LG BORE MALE ST (IV SETS) ×3 IMPLANT
SUCKER WEIGHTED FLEX (MISCELLANEOUS) ×3 IMPLANT
SUT BONE WAX W31G (SUTURE) ×3 IMPLANT
SUT ETHIBON 2 0 V 52N 30 (SUTURE) ×6 IMPLANT
SUT ETHIBOND 2 0 SH (SUTURE) ×1 IMPLANT
SUT ETHIBOND 2 0 SH 36X2 (SUTURE) ×2 IMPLANT
SUT ETHIBOND 2 0 V4 (SUTURE) IMPLANT
SUT ETHIBOND 2 0V4 GREEN (SUTURE) IMPLANT
SUT PROLENE 3 0 RB 1 (SUTURE) ×3 IMPLANT
SUT PROLENE 3 0 SH 1 (SUTURE) ×3 IMPLANT
SUT PROLENE 3 0 SH1 36 (SUTURE) ×3 IMPLANT
SUT PROLENE 4 0 RB 1 (SUTURE) ×8
SUT PROLENE 4 0 SH DA (SUTURE) ×9 IMPLANT
SUT PROLENE 4 0 TF (SUTURE) ×6 IMPLANT
SUT PROLENE 4-0 RB1 .5 CRCL 36 (SUTURE) ×16 IMPLANT
SUT PROLENE 5 0 C 1 36 (SUTURE) ×6 IMPLANT
SUT PROLENE 5 0 CC1 (SUTURE) IMPLANT
SUT PROLENE 6 0 CC (SUTURE) ×6 IMPLANT
SUT PROLENE 7 0 BV1 MDA (SUTURE) ×3 IMPLANT
SUT SILK  1 MH (SUTURE) ×2
SUT SILK 1 MH (SUTURE) ×4 IMPLANT
SUT SILK 1 TIES 10X30 (SUTURE) IMPLANT
SUT SILK 2 0 (SUTURE)
SUT SILK 2 0 SH CR/8 (SUTURE) ×3 IMPLANT
SUT SILK 2-0 18XBRD TIE 12 (SUTURE) IMPLANT
SUT SILK 3 0 SH CR/8 (SUTURE) IMPLANT
SUT SILK 4 0 (SUTURE)
SUT SILK 4-0 18XBRD TIE 12 (SUTURE) IMPLANT
SUT STEEL 6MS V (SUTURE) ×3 IMPLANT
SUT STEEL SZ 6 DBL 3X14 BALL (SUTURE) ×3 IMPLANT
SUT TEM PAC WIRE 2 0 SH (SUTURE) ×12 IMPLANT
SUT VIC AB 1 CTX 18 (SUTURE) ×6 IMPLANT
SUT VIC AB 2-0 CTX 27 (SUTURE) ×6 IMPLANT
SUT VIC AB 3-0 X1 27 (SUTURE) IMPLANT
SYSTEM SAHARA CHEST DRAIN ATS (WOUND CARE) ×3 IMPLANT
TOWEL GREEN STERILE (TOWEL DISPOSABLE) ×12 IMPLANT
TOWEL GREEN STERILE FF (TOWEL DISPOSABLE) ×6 IMPLANT
TOWEL OR 17X24 6PK STRL BLUE (TOWEL DISPOSABLE) ×6 IMPLANT
TOWEL OR 17X26 10 PK STRL BLUE (TOWEL DISPOSABLE) ×6 IMPLANT
TRAY CATH LUMEN 1 20CM STRL (SET/KITS/TRAYS/PACK) ×3 IMPLANT
TRAY FOLEY SILVER 16FR TEMP (SET/KITS/TRAYS/PACK) ×3 IMPLANT
TUBE FEEDING 8FR 16IN STR KANG (MISCELLANEOUS) ×3 IMPLANT
TUBING ART PRESS 48 MALE/FEM (TUBING) ×6 IMPLANT
TUBING INSUFFLATION (TUBING) ×3 IMPLANT
UNDERPAD 30X30 (UNDERPADS AND DIAPERS) ×3 IMPLANT
VALVE MAGNA EASE AORTIC 19MM (Prosthesis & Implant Heart) ×3 IMPLANT
VALVE MAGNA MITRAL 25MM (Prosthesis & Implant Heart) ×3 IMPLANT
VENT LEFT HEART 12002 (CATHETERS) ×3
WATER STERILE IRR 1000ML POUR (IV SOLUTION) ×6 IMPLANT

## 2017-11-12 NOTE — Anesthesia Preprocedure Evaluation (Addendum)
Anesthesia Evaluation  Patient identified by MRN, date of birth, ID band Patient awake    Reviewed: Allergy & Precautions, NPO status , Patient's Chart, lab work & pertinent test results, reviewed documented beta blocker date and time   History of Anesthesia Complications (+) PONV and history of anesthetic complications  Airway Mallampati: II  TM Distance: >3 FB Neck ROM: Full    Dental  (+) Teeth Intact   Pulmonary shortness of breath, asthma , COPD,    breath sounds clear to auscultation       Cardiovascular hypertension, Pt. on medications and Pt. on home beta blockers + angina + CAD, + Past MI and +CHF  + Valvular Problems/Murmurs AS and MR  Rhythm:Regular + Systolic murmurs    Neuro/Psych  Headaches, PSYCHIATRIC DISORDERS Depression  Neuromuscular disease    GI/Hepatic Neg liver ROS, hiatal hernia, GERD  Medicated,  Endo/Other  diabetes, Type 2, Insulin DependentHypothyroidism   Renal/GU negative Renal ROS     Musculoskeletal  (+) Fibromyalgia -  Abdominal   Peds  Hematology   Anesthesia Other Findings Left ventricle: Systolic function was normal. The estimated   ejection fraction was in the range of 60% to 65%. Wall motion was   normal; there were no regional wall motion abnormalities. - Aortic valve: Cusp separation was moderately reduced. There was   moderate stenosis. There was mild regurgitation. - Mitral valve: Severely thickened, mildly calcified leaflets .   Leaflet separation was moderately reduced. The findings are   consistent with moderate to severe stenosis. There was moderate   regurgitation. Mean gradient (D): 10 mm Hg. - Left atrium: The atrium was dilated. No evidence of thrombus in   the atrial cavity or appendage. No evidence of thrombus in the   atrial cavity or appendage. - Right atrium: No evidence of thrombus in the atrial cavity or   Appendage.  Double vessel CAD s/p 2V CABG with  1/2 patent bypass grafts 2. The LAD courses to the apex. The mid LAD is occluded after a moderate caliber diagonal branch. The mid and distal LAD fills from the patent LIMA graft. The stent in the distal LAD is patent without restenosis.  3. The Circumflex artery is patent. There are patent stents in the proximal Circumflex and distal segment of the obtuse marginal branch. The proximal segment of the OM branch prior to the stent has a moderate non-obstructive stenosis. The vein graft to the OM branch is known to be occluded and was not selectively engaged today.  4. The RCA is a small to moderate caliber co-dominant vessel with mild non-obstructive plaque.  5. Moderate Aortic stenosis (peak to peak gradient 23 mmHg, mean gradient 20 mmHg, AVA 2.2 cm2) 6. Moderately severe mitral stenosis (peak to peak gradient 14 mmHg, mean gradient 12.5 mmHg, MVA 3.32cm2)    Reproductive/Obstetrics                           Anesthesia Physical Anesthesia Plan  ASA: IV  Anesthesia Plan: General   Post-op Pain Management:    Induction: Intravenous  PONV Risk Score and Plan: 4 or greater and Treatment may vary due to age or medical condition  Airway Management Planned: Oral ETT  Additional Equipment: Arterial line, CVP, PA Cath, TEE, Ultrasound Guidance Line Placement and 3D TEE  Intra-op Plan:   Post-operative Plan: Post-operative intubation/ventilation  Informed Consent: I have reviewed the patients History and Physical, chart, labs and discussed the procedure  including the risks, benefits and alternatives for the proposed anesthesia with the patient or authorized representative who has indicated his/her understanding and acceptance.   Dental advisory given  Plan Discussed with: CRNA and Surgeon  Anesthesia Plan Comments:         Anesthesia Quick Evaluation

## 2017-11-12 NOTE — Transfer of Care (Signed)
Immediate Anesthesia Transfer of Care Note  Patient: Denise Cuevas  Procedure(s) Performed: REDO STERNOTOMY (N/A Chest) AORTIC VALVE REPLACEMENT (AVR)  USING 19 MM MAGNA EASE PERICARDIAL BIOPROSTHESIS-AORTIC, MODEL 3300TFX (N/A Chest) MITRAL VALVE (MV) REPLACEMENT USING 25 MM MAGNA MITRAL EASE PERICARDIAL BIOPROSTHESIS, MODEL 7300TFX (N/A Chest) TRANSESOPHAGEAL ECHOCARDIOGRAM (TEE) (N/A Chest) ARTERIAL LINE INSERTION (Right Groin)  Patient Location: SICU  Anesthesia Type:General  Level of Consciousness: Patient remains intubated per anesthesia plan  Airway & Oxygen Therapy: Patient remains intubated per anesthesia plan and Patient placed on Ventilator (see vital sign flow sheet for setting)  Post-op Assessment: Report given to RN and Post -op Vital signs reviewed and stable  Post vital signs: Reviewed and stable  Last Vitals:  Vitals:   11/12/17 0704 11/12/17 0705  BP: (!) 123/31   Pulse: 72   Resp:  18  Temp:  36.6 C  SpO2:  100%    Last Pain:  Vitals:   11/12/17 0705  TempSrc: Oral      Patients Stated Pain Goal: 3 (16/55/37 4827)  Complications: No apparent anesthesia complications

## 2017-11-12 NOTE — Progress Notes (Signed)
E.T tube pulled back 2cm due to radiology report of endotracheal tube being in the right main stem. Tube was secured at 20cm at the lip.

## 2017-11-12 NOTE — Anesthesia Procedure Notes (Signed)
Procedure Name: Intubation Date/Time: 11/12/2017 9:30 AM Performed by: Teressa Lower., CRNA Pre-anesthesia Checklist: Patient identified, Emergency Drugs available, Suction available and Patient being monitored Patient Re-evaluated:Patient Re-evaluated prior to induction Oxygen Delivery Method: Circle system utilized Preoxygenation: Pre-oxygenation with 100% oxygen Induction Type: IV induction and Cricoid Pressure applied Ventilation: Mask ventilation without difficulty and Oral airway inserted - appropriate to patient size Laryngoscope Size: Mac and 3 Grade View: Grade I Tube type: Oral Tube size: 7.0 mm Number of attempts: 1 Airway Equipment and Method: Stylet and Oral airway Placement Confirmation: ETT inserted through vocal cords under direct vision,  positive ETCO2 and breath sounds checked- equal and bilateral Secured at: 21 cm Tube secured with: Tape Dental Injury: Teeth and Oropharynx as per pre-operative assessment

## 2017-11-12 NOTE — Brief Op Note (Addendum)
      [address]       [place] [address]             [phone]    11/12/2017  4:08 PM  PATIENT:  Denise Cuevas  66 y.o. female  PRE-OPERATIVE DIAGNOSIS:  1. Moderate aortic stenosis  2. Severe mitral stenosis   POST-OPERATIVE DIAGNOSIS:   1. Moderate aortic stenosis  2. Severe mitral stenosis   PROCEDURE:  TRANSESOPHAGEAL ECHOCARDIOGRAM (TEE), REDO STERNOTOMY , AORTIC VALVE REPLACEMENT (AVR) (using a Pericardial tissue valve, model# 3300, serial# [id number], size 19) and  MITRAL VALVE (MV) REPLACEMENT  (using a Magna Ease Mitral Pericardial Bioprosthesis, model 7300TFX, Serial#[id number], size 25).  SURGEON:  Surgeon(s) and Role:    [name], MD - Primary  PHYSICIAN ASSISTANTS: 1. Lars Pinks PA-C 2. Wayne Gold PA-C  ANESTHESIA:   general  BLOOD ADMINISTERED:Two CC PRBC, four FFP and two PLTS  DRAINS: Chest tubes placed in the mediastinal and pleural spaces   SPECIMEN:  Source of Specimen:  Native AV and MV leaflets  DISPOSITION OF SPECIMEN:  PATHOLOGY  COUNTS CORRECT: No  DICTATION: .Dragon Dictation  PLAN OF CARE: Admit to inpatient   PATIENT DISPOSITION:  ICU - intubated and hemodynamically stable.   Delay start of Pharmacological VTE agent (>24hrs) due to surgical blood loss or risk of bleeding: yes  BASELINE WEIGHT: 74.5 kg

## 2017-11-12 NOTE — H&P (Signed)
[address]       [place] [address]             [phone]        Daphanie E Hirota [place] Medical Record #[id number] Date of Birth: [birth year]  Referring: Dr Johnsie Cancel  Primary Care: Will be a new patient of [name], MD likely December  Chief Complaint:    Chief Complaint  Patient presents with  . Shortness of Breath  . Chest Pain  . Leg Swelling    Previous cardiac surgery: DATE OF PROCEDURE: 11/27/2011   OPERATIVE REPORT  PREOPERATIVE DIAGNOSES: Coronary occlusive disease with recent  subendocardial myocardial infarction.  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  SURGICAL PROCEDURE: Coronary artery bypass grafting x2 with the left  internal mammary to the left anterior descending coronary artery and  reverse saphenous vein graft to the circumflex coronary artery, with  endo-vein harvesting from the right thigh.   HPI:  This is a 66 year old Caucasian female with a past medical history of coronary artery disease (s/p  and CABG x 2 11/2011 and stents placed 2013), diabetes mellitus, non alcoholic cirrhosis, asthma,  who presented on 10/15/2017 with complaints of intermittent non exertional chest pain, worsening shortness of breath, fatigue, and cough. She states she had intermittent dizziness as well.  Regarding her cough, she went to [place] who told her she had acute bronchitis and COPD. She was given a course of Azithromycin, without much improvement in her symptoms.   She then presented to her cardiologist's office on 10/15/2017 . She presented with complaints of "something is wrong but unsure of what".  Specifically, she had had intermittent chest pain (both with exertion and at rest) and was sleepy and tired. In addition, she had complaints of leg redness which she used hydrocortisone cream with some improvement. She was admitted to [facility] for  further evaluation and treatment. EKG apparently showed no acute ST changes.  Initial and subsequent Troponin I was less than 0.03. Echo done 10/16/2017 showed LVEF 60-65%,  moderate AS, moderate MR, and moderate to severe MS. A cardiac catheterization was done on 10/16/2017 and showed previously placed Ost Cx to Prox Cx stent (unknown type) is widely patent, previously placed 2nd Mrg stent (unknown type) is widely patent, Ost 2nd Mrg lesion is 50% stenosed, prox LAD lesion is 100% stenosed, previously placed Mid LAD to Dist LAD stent (unknown type) is widely patent, moderate aortic valve stenosis, and moderate mitral valve stenosis. A cardiothoracic consultation was requested , the patient stabilized and was discharged home to be seen in the office .  She comes into the office today feels much better than when she was in the hospital , she continues on diuretic therapy and notes she has lost approximately 15 pounds .   Since seen last week the patient has continued to lose several more pounds, she notes she feels much better denies shortness of breath, and especially notes that the edema in her lower extremities is much improved.    current Activity/ Functional Status: Patient was independent with mobility/ambulation, transfers, ADL's, IADL's.   Zubrod Score: At the time of surgery this patient's most appropriate activity status/level should be described as: []     0    Normal activity, no symptoms []     1    Restricted in physical strenuous activity but ambulatory, able to do out light  work [x]     2    Ambulatory and capable of self care, unable to do work activities, up and about more than 50% of the time                            []     3    Only limited self care, in bed greater than 50% of waking hours []     4    Completely disabled, no self care, confined to bed or chair []     5    Moribund  Past Medical History:  Diagnosis Date  . Angina   . Asthma   . CHF (congestive heart failure)  ([place])   . Cirrhosis of liver ([place]) 08/2014   idiopathic  . Coronary artery disease    s/p CABG  . Depression   . Diabetes mellitus    type 2  . Dyspnea   . Edema extremities    consistent edema lower legs, feet, and right quadrant abdominal pain-"goes and comes"  . Family history of adverse reaction to anesthesia    SISTER HAS NAUSEA  . Fibromyalgia   . GERD (gastroesophageal reflux disease)   . H/O hiatal hernia   . Headache(784.0)    occ. generalized  . Heart murmur   . Hypercholesteremia   . Hypertension   . Hypothyroid   . Myocardial infarction [facility]) 11/2011   MI x2- 5'99,3'57 coronary stent(chest pain episode at time)  . PONV (postoperative nausea and vomiting)     Past Surgical History:  Procedure Laterality Date  . CHOLECYSTECTOMY     open many yrs ago  . CORONARY ANGIOPLASTY WITH STENT PLACEMENT  05/06/2012  . KNEE SURGERY Right    scope surgery    Social History   Socioeconomic History  . Marital status: Married    Spouse name: Not on file  . Number of children: Not on file  . Years of education: Not on file  . Highest education level: Not on file  Occupational History  . Occupation: Retired Biomedical scientist, Environmental consultant principal  Tobacco Use  . Smoking status: Never Smoker  . Smokeless tobacco: Never Used  Substance and Sexual Activity  . Alcohol use: No  . Drug use: No  . Sexual activity: Yes    Allergies  Allergen Reactions  . Exenatide Nausea And Vomiting  . Ace Inhibitors Other (See Comments)    pseudoasthma    Current Facility-Administered Medications  Medication Dose Route Frequency Provider Last Rate Last Dose  . [MAR Hold] 0.9 %  sodium chloride infusion  250 mL Intravenous PRN [place] Merle C, NP      . 0.9 %  sodium chloride infusion   Intravenous Continuous Burnell Blanks, MD 50 mL/hr at 10/20/17 1642    . [MAR Hold] acetaminophen (TYLENOL) tablet 650 mg  650 mg Oral Q4H PRN Burtis Junes, NP      . Doug Sou Hold]  acetaminophen-codeine (TYLENOL #3) 300-30 MG per tablet 1 tablet  1 tablet Oral Q6H PRN Everrett Coombe, MD   1 tablet at 10/17/17 2117  . [MAR Hold] albuterol (PROVENTIL) (2.5 MG/3ML) 0.083% nebulizer solution 2.5 mg  2.5 mg Nebulization Q4H PRN Truitt Merle C, NP   2.5 mg at 10/18/17 0819  . [MAR Hold] aspirin chewable tablet 81 mg  81 mg Oral Daily Andrena Mews T, MD      . Doug Sou Hold] atorvastatin (LIPITOR) tablet 40 mg  40 mg  Oral q1800 Rogue Bussing, MD   40 mg at 10/19/17 1645  . [MAR Hold] DULoxetine (CYMBALTA) DR capsule 90 mg  90 mg Oral Q lunch Olene Floss Sheffield, MD   90 mg at 10/19/17 1212  . [MAR Hold] ferrous sulfate tablet 325 mg  325 mg Oral Q breakfast Andrena Mews T, MD   325 mg at 10/19/17 0821  . [MAR Hold] furosemide (LASIX) tablet 20 mg  20 mg Oral Daily [name], MD      . Doug Sou Hold] insulin aspart (novoLOG) injection 0-9 Units  0-9 Units Subcutaneous TID WC Kinnie Feil, MD   2 Units at 10/20/17 1228  . [MAR Hold] insulin aspart protamine- aspart (NOVOLOG MIX 70/30) injection 40 Units  40 Units Subcutaneous BID AC Rogue Bussing, MD   40 Units at 10/19/17 1644  . [MAR Hold] levothyroxine (SYNTHROID, LEVOTHROID) tablet 50 mcg  50 mcg Oral QAC breakfast Andrena Mews T, MD   50 mcg at 10/19/17 0641  . [MAR Hold] metoprolol tartrate (LOPRESSOR) tablet 12.5 mg  12.5 mg Oral BID [name], MD      . Doug Sou Hold] mometasone-formoterol [facility]) 100-5 MCG/ACT inhaler 2 puff  2 puff Inhalation BID Rogue Bussing, MD   2 puff at 10/20/17 [phone]  . [MAR Hold] nitroGLYCERIN (NITROSTAT) SL tablet 0.4 mg  0.4 mg Sublingual Q5 Min x 3 PRN Burtis Junes, NP      . Doug Sou Hold] ondansetron (ZOFRAN) injection 4 mg  4 mg Intravenous Q6H PRN Burtis Junes, NP      . Doug Sou Hold] pantoprazole (PROTONIX) EC tablet 80 mg  80 mg Oral Daily Rogue Bussing, MD   80 mg at 10/20/17 1006  . [MAR Hold] polyethylene glycol (MIRALAX /  GLYCOLAX) packet 17 g  17 g Oral Daily Carlyle Dolly, MD   17 g at 10/19/17 0931  . [MAR Hold] sodium chloride flush (NS) 0.9 % injection 3 mL  3 mL Intravenous Q12H Truitt Merle C, NP   3 mL at 10/19/17 2203  . [MAR Hold] spironolactone (ALDACTONE) tablet 12.5 mg  12.5 mg Oral Daily Carlyle Dolly, MD   12.5 mg at 10/20/17 1008    Medications Prior to Admission  Medication Sig Dispense Refill Last Dose  . albuterol (PROVENTIL HFA;VENTOLIN HFA) 108 (90 BASE) MCG/ACT inhaler Inhale 2 puffs into the lungs every 6 (six) hours as needed for wheezing or shortness of breath. 1 Inhaler 2 10/15/2017 at Unknown time  . albuterol (PROVENTIL) (2.5 MG/3ML) 0.083% nebulizer solution Take 3 mLs (2.5 mg total) by nebulization every 2 (two) hours as needed for wheezing. 75 mL 0 10/14/2017  . aspirin 81 MG tablet Take 81 mg by mouth daily.   10/14/2017  . atorvastatin (LIPITOR) 40 MG tablet Take 40 mg by mouth daily.   10/14/2017  . budesonide-formoterol (SYMBICORT) 80-4.5 MCG/ACT inhaler Inhale 2 puffs into the lungs 2 (two) times daily.   10/14/2017  . DULoxetine (CYMBALTA) 30 MG capsule Take 90 mg by mouth daily with lunch. In conjunction with one 60 mg capsule to equal a total of 90 milligrams   10/14/2017  . DULoxetine (CYMBALTA) 60 MG capsule Take 90 mg by mouth daily with lunch. In conjunction with one 30 mg capsule to equal a total of 90 milligrams   10/14/2017  . furosemide (LASIX) 40 MG tablet Take 1 tablet (40 mg total) by mouth daily. 30 tablet 0 10/14/2017  . HUMALOG MIX  75/25 KWIKPEN (75-25) 100 UNIT/ML Kwikpen INJECT 60 UNITS SUBCUTANEOUSLY BEFORE BREAKFAST AND 60 UNITS BEFORE EVENING MEAL  10 10/15/2017  . JARDIANCE 25 MG TABS tablet Take 25 mg daily by mouth.  5 10/14/2017  . levothyroxine (SYNTHROID, LEVOTHROID) 50 MCG tablet Take 50 mcg by mouth daily before breakfast.   10/14/2017  . metoprolol tartrate (LOPRESSOR) 25 MG tablet Take 12.5 mg by mouth 2 (two) times daily.    10/14/2017  .  nitroGLYCERIN (NITROSTAT) 0.4 MG SL tablet Place 0.4 mg under the tongue every 5 (five) minutes as needed. For chest pain    at prn  . omeprazole-sodium bicarbonate (ZEGERID) 40-1100 MG per capsule Take 1 capsule by mouth daily before breakfast.   10/15/2017 at Unknown time  . spironolactone (ALDACTONE) 25 MG tablet Take 1 tablet (25 mg total) by mouth daily. 30 tablet 0 10/14/2017  . hydrocortisone cream 1 % Apply topically 2 (two) times daily. Apply to right lower leg (Patient taking differently: Apply 1 application topically 2 (two) times daily as needed for itching. Apply to right lower leg) 30 g 0 Taking    Family History  Problem Relation Age of Onset  . Heart disease Father 13       died of MI  . Emphysema Father        smoked  . Peripheral vascular disease Mother 64       bilaterial amputations   Review of Systems:     Cardiac Review of Systems: Y or N  Chest Pain [  Y  ] Exertional SOB  [Y  ]  Orthopnea [ N ]   Pedal Edema [  Y ]    Palpitations [ N ] Syncope  [ N ]  Presyncope [ N  ]  General Review of Systems: [Y] = yes [ N ]=no Constitional:  fatigue [ Y ]; nausea [ N ]; night sweats Aqua.Slicker  ]; fever Aqua.Slicker  ]; or chills [ N ]                                                               Dental: poor dentition (Patient has not seen a dentist in over a year. She had an appointment but had to cancel it because too tired)  Eye : diplopia [ N  ]; Amaurosis fugax[ N ]; Resp: cough [ Y ];  wheezing[ N ];  hemoptysis[ N ];   GI:  vomiting[ N];  dysphagia[N  ]; melena[N  ];  hematochezia Aqua.Slicker  ];  GU:  hematuria[ N ];   dysuria [ N ];  nocturia[ N ];               Skin: rash, swelling[ Y ];, or redness [ Y ];  Heme/Lymph: bruising[ Y ];  anemia[ Y ];              Neuro: TIA[ N ];   stroke[N  ];  vertigo[N  ];  seizures[N  ];   Psych:depression[ Y ]; anxiety[ Y ];  Endocrine: diabetes[ Y ];  thyroid dysfunction[Y  ];    Physical Exam: BP (!) 121/41   Pulse 90   Temp 98.3 F (36.8 C)  (Oral)   Resp 16   Ht 5\' 2"  (1.575 m)  Wt 162 lb (73.5 kg)   LMP  (LMP Unknown)   SpO2 96%   BMI 29.63 kg/m   General appearance: alert, cooperative, appears older than stated age and no distress Head: Normocephalic, without obvious abnormality, atraumatic Neck: no adenopathy, no carotid bruit, no JVD, supple, symmetrical, trachea midline and thyroid not enlarged, symmetric, no tenderness/mass/nodules Lymph nodes: Cervical, supraclavicular, and axillary nodes normal. Resp: clear to auscultation bilaterally Back: symmetric, no curvature. ROM normal. No CVA tenderness. Cardio: systolic murmur: holosystolic 3/6, crescendo at lower left sternal border GI: soft, non-tender; bowel sounds normal; no masses,  no organomegaly Extremities: extremities normal, atraumatic, no cyanosis or edema and Homans sign is negative, no sign of DVT Neurologic: Grossly normal Vein from right thigh at previous surgery  Diagnostic Studies & Laboratory data:     Recent Radiology Findings:  Dg Chest 2 View  Result Date: 11/11/2017 CLINICAL DATA:  CABG. EXAM: CHEST  2 VIEW COMPARISON:  10/15/2017. FINDINGS: Mediastinum hilar structures normal. Prior CABG. Heart size normal. No focal infiltrate. No pleural effusion or pneumothorax. Degenerative changes thoracic spine. Surgical clips right upper quadrant. IMPRESSION: No acute cardiopulmonary disease. Electronically Signed   By: Marcello Moores  Register   On: 11/11/2017 06:30   Dg Chest 2 View  Result Date: 10/15/2017 CLINICAL DATA:  Chest pain, shortness of breath and cough for 4 weeks. EXAM: CHEST  2 VIEW COMPARISON:  10/12/2017 and prior radiographs FINDINGS: Cardiomegaly and CABG changes again noted. There is no evidence of focal airspace disease, pulmonary edema, suspicious pulmonary nodule/mass, pleural effusion, or pneumothorax. No acute bony abnormalities are identified. IMPRESSION: Cardiomegaly without evidence of acute cardiopulmonary disease. Electronically Signed    By: Margarette [place] M.D.   On: 10/15/2017 13:58   US Abdomen Complete  Result Date: 10/10/2017 CLINICAL DATA:  66 year old female with cirrhosis. Hepatocellular carcinoma screening. History of cholecystectomy. EXAM: ABDOMEN ULTRASOUND COMPLETE COMPARISON:  03/31/2017 and prior exams FINDINGS: Gallbladder: The gallbladder is not visualized compatible with cholecystectomy. Common bile duct: Diameter: 7 mm. No intrahepatic or extrahepatic biliary dilatation. Liver: Nodular contour and coarse hepatic echotexture compatible with cirrhosis. No focal hepatic masses are identified. Portal vein is patent on color Doppler imaging with normal direction of blood flow towards the liver. IVC: No abnormality visualized. Pancreas: Visualized portion unremarkable. Spleen: Splenomegaly again noted with a splenic volume of 715 cc Right Kidney: Length: 11.4 cm. Echogenicity within normal limits. No mass or hydronephrosis visualized. Left Kidney: Length: 11.7 cm. Echogenicity within normal limits. No mass or hydronephrosis visualized. Abdominal aorta: No aneurysm visualized. Other findings: None.  No ascites noted. IMPRESSION: 1. Cirrhosis without evidence of focal mass. 2. Splenomegaly again identified. 3. No other significant abnormalities. Electronically Signed   By: Margarette [place] M.D.   On: 10/10/2017 10:26   I have independently reviewed the above radiologic studies.  Recent Lab Findings: Lab Results  Component Value Date   WBC 6.5 10/20/2017   HGB 9.4 (L) 10/20/2017   HCT 31.6 (L) 10/20/2017   PLT 99 (L) 10/20/2017   GLUCOSE 166 (H) 10/19/2017   CHOL 121 10/18/2017   TRIG 61 10/18/2017   HDL 33 (L) 10/18/2017   LDLCALC 76 10/18/2017   ALT 34 10/17/2017   AST 58 (H) 10/17/2017   NA 136 10/19/2017   K 3.8 10/19/2017   CL 103 10/19/2017   CREATININE 1.06 (H) 10/19/2017   BUN 22 (H) 10/19/2017   CO2 26 10/19/2017   TSH 2.664 10/15/2017   INR 1.31 10/15/2017   HGBA1C 8.4 (  H) 10/15/2017   Lab Results    Component Value Date   WBC 6.6 11/10/2017   HGB 10.2 (L) 11/10/2017   HCT 33.4 (L) 11/10/2017   PLT 112 (L) 11/10/2017   GLUCOSE 334 (H) 11/10/2017   CHOL 121 10/18/2017   TRIG 61 10/18/2017   HDL 33 (L) 10/18/2017   LDLCALC 76 10/18/2017   ALT 44 11/10/2017   AST 93 (H) 11/10/2017   NA 133 (L) 11/10/2017   K 3.9 11/10/2017   CL 106 11/10/2017   CREATININE 0.92 11/10/2017   BUN 13 11/10/2017   CO2 17 (L) 11/10/2017   TSH 2.664 10/15/2017   INR 1.14 11/10/2017   HGBA1C 7.5 (H) 11/10/2017   Imaging Studies:  RIGHT/LEFT HEART CATH AND CORONARY/GRAFT ANGIOGRAPHY by Dr. Angelena Form on 10/20/2017:  Conclusion     Prox RCA to Mid RCA lesion is 20% stenosed.  Previously placed Ost Cx to Prox Cx stent (unknown type) is widely patent.  Previously placed 2nd Mrg stent (unknown type) is widely patent.  Ost 2nd Mrg lesion is 50% stenosed.  Prox LAD lesion is 100% stenosed.  SVG graft was not visualized.  LIMA graft was visualized by angiography and is normal in caliber.  The graft exhibits no disease.  Previously placed Mid LAD to Dist LAD stent (unknown type) is widely patent.  Ost LAD to Prox LAD lesion is 20% stenosed.  There is moderate aortic valve stenosis.  There is moderate mitral valve stenosis.  Hemodynamic findings consistent with moderate pulmonary hypertension.  LV end diastolic pressure is normal.   1. Double vessel CAD s/p 2V CABG with 1/2 patent bypass grafts 2. The LAD courses to the apex. The mid LAD is occluded after a moderate caliber diagonal branch. The mid and distal LAD fills from the patent LIMA graft. The stent in the distal LAD is patent without restenosis.  3. The Circumflex artery is patent. There are patent stents in the proximal Circumflex and distal segment of the obtuse marginal branch. The proximal segment of the OM branch prior to the stent has a moderate non-obstructive stenosis. The vein graft to the OM branch is known to be occluded  and was not selectively engaged today.  4. The RCA is a small to moderate caliber co-dominant vessel with mild non-obstructive plaque.  5. Moderate Aortic stenosis (peak to peak gradient 23 mmHg, mean gradient 20 mmHg, AVA 2.2 cm2) 6. Moderately severe mitral stenosis (peak to peak gradient 14 mmHg, mean gradient 12.5 mmHg, MVA 3.32cm2)  Recommendations: Medical management of CAD. Close follow up of her valve disease. Consider surgical consultation.     Hemo Data    Most Recent Value  Fick Cardiac Output 10.71 L/min  Fick Cardiac Output Index 6.12 (L/min)/BSA  Aortic Mean Gradient 20 mmHg  Aortic Peak Gradient 23 mmHg  Aortic Valve Area 2.20  Aortic Value Area Index 1.26 cm2/BSA  RA A Wave 12 mmHg  RA V Wave 10 mmHg  RA Mean 7 mmHg  RV Systolic Pressure 55 mmHg  RV Diastolic Pressure 0 mmHg  RV EDP 13 mmHg  PA Systolic Pressure 59 mmHg  PA Diastolic Pressure 20 mmHg  PA Mean 40 mmHg  PW A Wave 27 mmHg  PW V Wave 45 mmHg  PW Mean 25 mmHg  AO Systolic Pressure 585 mmHg  AO Diastolic Pressure 53 mmHg  AO Mean 76 mmHg  LV Systolic Pressure 277 mmHg  LV Diastolic Pressure -10 mmHg  LV EDP 0 mmHg  Arterial  Occlusion Pressure Extended Systolic Pressure 425 mmHg  Arterial Occlusion Pressure Extended Diastolic Pressure 45 mmHg  Arterial Occlusion Pressure Extended Mean Pressure 79 mmHg  Left Ventricular Apex Extended Systolic Pressure 956 mmHg  Left Ventricular Apex Extended Diastolic Pressure 0 mmHg  Left Ventricular Apex Extended EDP Pressure 10 mmHg  QP/QS 1  TPVR Index 6.54 HRUI  TSVR Index 12.43 HRUI  PVR SVR Ratio 0.17  TPVR/TSVR Ratio 0.53   ECHO:TEE 10/20/2017 Transesophageal Echocardiography  Patient:    Jynesis, Nakamura MR #:       [id number] Study Date: 10/20/2017 Gender:     F Age:        66 Height:     157.5 cm Weight:     73.5 kg BSA:        1.82 m^2 Pt. Status: Room:       [facility] T  ORDERING     Ena Dawley,  M.D.  PERFORMING   Ena Dawley, M.D.  REFERRING    Ena Dawley, M.D.  [place] [id number]  SONOGRAPHER  Mikki Santee  cc:  ------------------------------------------------------------------- LV EF: 60% -   65%  ------------------------------------------------------------------- Indications:      Aortic stenosis 424.1.  ------------------------------------------------------------------- History:   PMH:   Murmur.  Coronary artery disease.  Congestive heart failure.  Mitral valve disease.  Risk factors:  Diabetes mellitus.  ------------------------------------------------------------------- Study Conclusions  - Left ventricle: Systolic function was normal. The estimated   ejection fraction was in the range of 60% to 65%. Wall motion was   normal; there were no regional wall motion abnormalities. - Aortic valve: Cusp separation was moderately reduced. There was   moderate stenosis. There was mild regurgitation. - Mitral valve: Severely thickened, mildly calcified leaflets .   Leaflet separation was moderately reduced. The findings are   consistent with moderate to severe stenosis. There was moderate   regurgitation. Mean gradient (D): 10 mm Hg. - Left atrium: The atrium was dilated. No evidence of thrombus in   the atrial cavity or appendage. No evidence of thrombus in the   atrial cavity or appendage. - Right atrium: No evidence of thrombus in the atrial cavity or   appendage.  Impressions:  - Aortic valve is severely calcified predominantly in the   non-coronary leaflet, leaflet opening is moderately decreased but   still opening. Peak/mean transaortic gradients were 64/33 mmHg.   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 There is at most   moderate mitral regurgitation but moderate to severe mitral   stenosis with mean transmitral gradient 10  mmHg.  ------------------------------------------------------------------- Study data:   Study status:  Routine.  Consent:  The risks, benefits, and alternatives to the procedure were explained to the patient and informed consent was obtained.  Procedure:  The patient reported no pain pre or post test. Initial setup. The patient was brought to the laboratory. Surface ECG leads were monitored. Sedation. Conscious sedation was administered by cardiology staff. Transesophageal echocardiography. Topical anesthesia was obtained using viscous lidocaine. A transesophageal probe was inserted by the attending cardiologist. Image quality was adequate.  Study completion:  The patient tolerated the procedure well. There were no complications.  Administered medications:   Fentanyl, 57mcg. Midazolam, 4mg .          Diagnostic transesophageal echocardiography.  2D and color Doppler.  Birthdate:  Patient birthdate: [birth year].  Age:  Patient is  66 yr old.  Sex:  Gender: female.    BMI: 29.6 kg/m^2.  Blood pressure:     110/39  Patient status:  Inpatient.  Study date:  Study date: 10/20/2017. Study time: 02:01 PM.  Location:  Endoscopy.  -------------------------------------------------------------------  ------------------------------------------------------------------- Left ventricle:  Systolic function was normal. The estimated ejection fraction was in the range of 60% to 65%. Wall motion was normal; there were no regional wall motion abnormalities.  ------------------------------------------------------------------- Aortic valve:   Trileaflet; severely thickened, severely calcified leaflets. Cusp separation was moderately reduced.  Doppler:   There was moderate stenosis.   There was mild regurgitation.    Mean gradient (S): 33 mm Hg. Peak gradient (S): 64 mm Hg.  ------------------------------------------------------------------- Aorta:  There was mild atheroma. There was no evidence  for dissection. Aortic root: The aortic root was not dilated. Ascending aorta: The ascending aorta was normal in size. Descending aorta: The descending aorta was normal in size.  ------------------------------------------------------------------- Mitral valve:   Severely thickened, mildly calcified leaflets . Leaflet separation was moderately reduced.  Doppler:   The findings are consistent with moderate to severe stenosis.   There was moderate regurgitation.    Mean gradient (D): 10 mm Hg.  ------------------------------------------------------------------- Left atrium:  The atrium was dilated.  No evidence of thrombus in the atrial cavity or appendage.  No evidence of thrombus in the atrial cavity or appendage. The appendage was morphologically a left appendage, multilobulated, and of normal size. Emptying velocity was normal.  ------------------------------------------------------------------- Right ventricle:  The cavity size was normal. Wall thickness was normal. Systolic function was normal.  ------------------------------------------------------------------- Pulmonic valve:    Structurally normal valve.  ------------------------------------------------------------------- Tricuspid valve:   Structurally normal valve.   Leaflet separation was normal.  Doppler:  There was mild regurgitation.  ------------------------------------------------------------------- Pulmonary artery:   The main pulmonary artery was normal-sized.  ------------------------------------------------------------------- Right atrium:  The atrium was normal in size.  No evidence of thrombus in the atrial cavity or appendage. The appendage was morphologically a right appendage.  ------------------------------------------------------------------- Pericardium:  There was no pericardial effusion.  ------------------------------------------------------------------- Measurements   Aortic valve                               Value  Aortic valve peak velocity, S             401   cm/s  Aortic valve mean velocity, S             238   cm/s  Aortic valve VTI, S                       76.5  cm  Aortic mean gradient, S                   28    mm Hg  Aortic peak gradient, S                   64    mm Hg    Mitral valve                              Value  Mitral mean velocity, D                   148   cm/s  Mitral mean gradient, D  10    mm Hg  Mitral annulus VTI, D                     59.3  cm  Mitral maximal regurg velocity, PISA      535   cm/s  Mitral regurg VTI, PISA                   151   cm  Legend: (L)  and  (H)  mark values outside specified reference range.  ------------------------------------------------------------------- Prepared and Electronically Authenticated by  Ena Dawley, M.D. 2018-11-12T15:05:21  Assessment / plan:   1.  Moderate aortic stenosis (peak to peak gradient 23 mmHg, mean gradient 20 mmHg, AVA 2.2 cm2) by echo the AS is more severe-  Aortic valve peak velocity, S             401   cm/s  2.  Moderately severe mitral stenosis (peak to peak gradient 14 mmHg, mean gradient 12.5 mmHg, MVA 3.32cm2) 3. Diabetes insulin-dependent. HGA1C done 10/15/2017 8.4. Needs better glucose control. 4. Hypothyroidism: Recent TSH 2.66 5. History of iron deficiency anemia,thrombocytopenia, and splenomegaly-questionable if this is related to cirrhosis 6. Acute diastolic HF/valvular heart disease.   Further discussed with the patient proceeding with redo sternotomy and aortic and mitral valve replacement.  The vein graft to the circumflex is occluded, the mammary to the LAD is patent.  Depending on the degree of scarring and difficulty we can consider a vein graft to the circumflex, but that does involve significantly more dissection away from the areas required for aortic valve replacement and mitral valve replacement.  With the patient's underlying  cirrhosis,  And wished to avoid long-term Coumadi she prefers tissue valves.  She was cleared by dentist last week ,  The goals risks and alternatives of the planned surgical procedure Procedure(s): REDO STERNOTOMY, AORTIC VALVE REPLACEMENT, MITRAL VALVE REPLACEMENT,POSSIBLE CORONARY ARTERY BYPASS GRAFTING,TEE (N/A) AORTIC VALVE REPLACEMENT (AVR) (N/A) MITRAL VALVE (MV) REPLACEMENT (N/A) CORONARY ARTERY BYPASS GRAFTING (CABG) (N/A) TRANSESOPHAGEAL ECHOCARDIOGRAM (TEE) (N/A)  have been discussed with the patient in detail. The risks of the procedure including death, infection, stroke, myocardial infarction, bleeding, blood transfusion have all been discussed specifically.  I have quoted Laruth Bouchard a 6 % of perioperative mortality and a complication rate as high as 50 %. The patient's questions have been answered.GABRYELA KIMBRELL is willing  to proceed with the planned procedure.  [name] MD      [address] [place] [address] Office [phone]   Anthony

## 2017-11-12 NOTE — Anesthesia Procedure Notes (Signed)
Central Venous Catheter Insertion Performed by: Duane Boston, MD, anesthesiologist Start/End12/04/2017 7:51 AM, 11/12/2017 8:01 AM Patient location: Pre-op. Preanesthetic checklist: patient identified, IV checked, site marked, risks and benefits discussed, surgical consent, monitors and equipment checked, pre-op evaluation, timeout performed and anesthesia consent Position: Trendelenburg Lidocaine 1% used for infiltration and patient sedated Hand hygiene performed , maximum sterile barriers used  and Seldinger technique used Catheter size: 8.5 Fr Total catheter length 8. PA cath was placed.Sheath introducer Swan type:thermodilution PA Cath depth:50 Procedure performed using ultrasound guided technique. Ultrasound Notes:anatomy identified, needle tip was noted to be adjacent to the nerve/plexus identified, no ultrasound evidence of intravascular and/or intraneural injection and image(s) printed for medical record Attempts: 1 Following insertion, line sutured and dressing applied. Post procedure assessment: free fluid flow, blood return through all ports and no air  Patient tolerated the procedure well with no immediate complications.

## 2017-11-12 NOTE — Anesthesia Procedure Notes (Signed)
Arterial Line Insertion Start/End12/04/2017 8:00 AM, 11/12/2017 8:10 AM Performed by: Teressa Lower., CRNA, CRNA  Patient location: Pre-op. Preanesthetic checklist: patient identified, IV checked, site marked, risks and benefits discussed, surgical consent, monitors and equipment checked, pre-op evaluation, timeout performed and anesthesia consent Lidocaine 1% used for infiltration Left, radial was placed Catheter size: 20 G Hand hygiene performed , maximum sterile barriers used  and Seldinger technique used Allen's test indicative of satisfactory collateral circulation Attempts: 1 Procedure performed without using ultrasound guided technique. Following insertion, dressing applied. Post procedure assessment: normal and unchanged  Patient tolerated the procedure well with no immediate complications.

## 2017-11-12 NOTE — Progress Notes (Signed)
  Echocardiogram 2D Echocardiogram has been performed.  Denise Cuevas R 11/12/2017, 10:14 AM

## 2017-11-13 ENCOUNTER — Inpatient Hospital Stay (HOSPITAL_COMMUNITY): Payer: Medicare Other

## 2017-11-13 ENCOUNTER — Encounter (HOSPITAL_COMMUNITY): Payer: Self-pay | Admitting: Cardiothoracic Surgery

## 2017-11-13 LAB — BPAM FFP
BLOOD PRODUCT EXPIRATION DATE: 201812092359
BLOOD PRODUCT EXPIRATION DATE: 201812092359
Blood Product Expiration Date: 201812092359
Blood Product Expiration Date: 201812092359
ISSUE DATE / TIME: 201812051559
ISSUE DATE / TIME: 201812051559
ISSUE DATE / TIME: 201812051559
ISSUE DATE / TIME: 201812051559
UNIT TYPE AND RH: 6200
UNIT TYPE AND RH: 6200
UNIT TYPE AND RH: 6200
Unit Type and Rh: 6200

## 2017-11-13 LAB — BPAM PLATELET PHERESIS
BLOOD PRODUCT EXPIRATION DATE: 201812052359
Blood Product Expiration Date: 201812052359
ISSUE DATE / TIME: 201812051557
ISSUE DATE / TIME: 201812051557
UNIT TYPE AND RH: 6200
Unit Type and Rh: 5100

## 2017-11-13 LAB — GLUCOSE, CAPILLARY
Glucose-Capillary: 102 mg/dL — ABNORMAL HIGH (ref 65–99)
Glucose-Capillary: 105 mg/dL — ABNORMAL HIGH (ref 65–99)
Glucose-Capillary: 117 mg/dL — ABNORMAL HIGH (ref 65–99)
Glucose-Capillary: 120 mg/dL — ABNORMAL HIGH (ref 65–99)
Glucose-Capillary: 123 mg/dL — ABNORMAL HIGH (ref 65–99)
Glucose-Capillary: 126 mg/dL — ABNORMAL HIGH (ref 65–99)
Glucose-Capillary: 128 mg/dL — ABNORMAL HIGH (ref 65–99)
Glucose-Capillary: 128 mg/dL — ABNORMAL HIGH (ref 65–99)
Glucose-Capillary: 129 mg/dL — ABNORMAL HIGH (ref 65–99)
Glucose-Capillary: 134 mg/dL — ABNORMAL HIGH (ref 65–99)
Glucose-Capillary: 139 mg/dL — ABNORMAL HIGH (ref 65–99)
Glucose-Capillary: 146 mg/dL — ABNORMAL HIGH (ref 65–99)
Glucose-Capillary: 154 mg/dL — ABNORMAL HIGH (ref 65–99)
Glucose-Capillary: 157 mg/dL — ABNORMAL HIGH (ref 65–99)
Glucose-Capillary: 168 mg/dL — ABNORMAL HIGH (ref 65–99)
Glucose-Capillary: 170 mg/dL — ABNORMAL HIGH (ref 65–99)
Glucose-Capillary: 174 mg/dL — ABNORMAL HIGH (ref 65–99)
Glucose-Capillary: 183 mg/dL — ABNORMAL HIGH (ref 65–99)
Glucose-Capillary: 185 mg/dL — ABNORMAL HIGH (ref 65–99)
Glucose-Capillary: 194 mg/dL — ABNORMAL HIGH (ref 65–99)
Glucose-Capillary: 198 mg/dL — ABNORMAL HIGH (ref 65–99)
Glucose-Capillary: 219 mg/dL — ABNORMAL HIGH (ref 65–99)
Glucose-Capillary: 99 mg/dL (ref 65–99)

## 2017-11-13 LAB — CBC
HCT: 27.5 % — ABNORMAL LOW (ref 36.0–46.0)
Hemoglobin: 8.7 g/dL — ABNORMAL LOW (ref 12.0–15.0)
MCH: 24.9 pg — AB (ref 26.0–34.0)
MCHC: 31.6 g/dL (ref 30.0–36.0)
MCV: 78.8 fL (ref 78.0–100.0)
PLATELETS: 51 10*3/uL — AB (ref 150–400)
RBC: 3.49 MIL/uL — AB (ref 3.87–5.11)
RDW: 20.9 % — AB (ref 11.5–15.5)
WBC: 6.5 10*3/uL (ref 4.0–10.5)

## 2017-11-13 LAB — PREPARE FRESH FROZEN PLASMA
UNIT DIVISION: 0
Unit division: 0
Unit division: 0
Unit division: 0

## 2017-11-13 LAB — BASIC METABOLIC PANEL
Anion gap: 7 (ref 5–15)
BUN: 17 mg/dL (ref 6–20)
CHLORIDE: 109 mmol/L (ref 101–111)
CO2: 24 mmol/L (ref 22–32)
Calcium: 7.6 mg/dL — ABNORMAL LOW (ref 8.9–10.3)
Creatinine, Ser: 0.93 mg/dL (ref 0.44–1.00)
GFR calc Af Amer: >60 mL/min (ref >60)
GFR calc non Af Amer: >60 mL/min (ref >60)
GLUCOSE: 163 mg/dL — AB (ref 65–99)
Potassium: 3.6 mmol/L (ref 3.5–5.1)
SODIUM: 140 mmol/L (ref 135–145)

## 2017-11-13 LAB — AMMONIA: Ammonia: 56 umol/L — ABNORMAL HIGH (ref 9–35)

## 2017-11-13 LAB — POCT I-STAT 3, ART BLOOD GAS (G3+)
Acid-Base Excess: 1 mmol/L (ref 0.0–2.0)
Acid-Base Excess: 1 mmol/L (ref 0.0–2.0)
Acid-base deficit: 2 mmol/L (ref 0.0–2.0)
Acid-base deficit: 1 mmol/L (ref 0.0–2.0)
Acid-base deficit: 1 mmol/L (ref 0.0–2.0)
Bicarbonate: 23.5 mmol/L (ref 20.0–28.0)
Bicarbonate: 25.9 mmol/L (ref 20.0–28.0)
Bicarbonate: 25.7 mmol/L (ref 20.0–28.0)
Bicarbonate: 23.1 mmol/L (ref 20.0–28.0)
Bicarbonate: 22.8 mmol/L (ref 20.0–28.0)
O2 Saturation: 98 %
O2 Saturation: 99 %
O2 Saturation: 98 %
O2 Saturation: 99 %
O2 Saturation: 97 %
Patient temperature: 36.4
Patient temperature: 36.8
Patient temperature: 37
Patient temperature: 97
Patient temperature: 37.2
TCO2: 25 mmol/L (ref 22–32)
TCO2: 27 mmol/L (ref 22–32)
TCO2: 27 mmol/L (ref 22–32)
TCO2: 24 mmol/L (ref 22–32)
TCO2: 24 mmol/L (ref 22–32)
pCO2 arterial: 39.3 mmHg (ref 32.0–48.0)
pCO2 arterial: 40.9 mmHg (ref 32.0–48.0)
pCO2 arterial: 38.9 mmHg (ref 32.0–48.0)
pCO2 arterial: 34.1 mmHg (ref 32.0–48.0)
pCO2 arterial: 35.6 mmHg (ref 32.0–48.0)
pH, Arterial: 7.382 (ref 7.350–7.450)
pH, Arterial: 7.408 (ref 7.350–7.450)
pH, Arterial: 7.428 (ref 7.350–7.450)
pH, Arterial: 7.435 (ref 7.350–7.450)
pH, Arterial: 7.415 (ref 7.350–7.450)
pO2, Arterial: 97 mmHg (ref 83.0–108.0)
pO2, Arterial: 117 mmHg — ABNORMAL HIGH (ref 83.0–108.0)
pO2, Arterial: 110 mmHg — ABNORMAL HIGH (ref 83.0–108.0)
pO2, Arterial: 108 mmHg (ref 83.0–108.0)
pO2, Arterial: 93 mmHg (ref 83.0–108.0)

## 2017-11-13 LAB — POCT I-STAT, CHEM 8
BUN: 16 mg/dL (ref 6–20)
BUN: 23 mg/dL — ABNORMAL HIGH (ref 6–20)
Calcium, Ion: 1.13 mmol/L — ABNORMAL LOW (ref 1.15–1.40)
Calcium, Ion: 1.12 mmol/L — ABNORMAL LOW (ref 1.15–1.40)
Chloride: 106 mmol/L (ref 101–111)
Chloride: 106 mmol/L (ref 101–111)
Creatinine, Ser: 0.8 mg/dL (ref 0.44–1.00)
Creatinine, Ser: 1 mg/dL (ref 0.44–1.00)
Glucose, Bld: 156 mg/dL — ABNORMAL HIGH (ref 65–99)
Glucose, Bld: 186 mg/dL — ABNORMAL HIGH (ref 65–99)
HCT: 25 % — ABNORMAL LOW (ref 36.0–46.0)
HCT: 27 % — ABNORMAL LOW (ref 36.0–46.0)
Hemoglobin: 8.5 g/dL — ABNORMAL LOW (ref 12.0–15.0)
Hemoglobin: 9.2 g/dL — ABNORMAL LOW (ref 12.0–15.0)
Potassium: 3.6 mmol/L (ref 3.5–5.1)
Potassium: 4.1 mmol/L (ref 3.5–5.1)
Sodium: 141 mmol/L (ref 135–145)
Sodium: 141 mmol/L (ref 135–145)
TCO2: 25 mmol/L (ref 22–32)
TCO2: 23 mmol/L (ref 22–32)

## 2017-11-13 LAB — PREPARE PLATELET PHERESIS
UNIT DIVISION: 0
Unit division: 0

## 2017-11-13 LAB — MAGNESIUM: Magnesium: 3.2 mg/dL — ABNORMAL HIGH (ref 1.7–2.4)

## 2017-11-13 LAB — AST: AST: 149 U/L — ABNORMAL HIGH (ref 15–41)

## 2017-11-13 LAB — ALT: ALT: 41 U/L (ref 14–54)

## 2017-11-13 MED ORDER — FUROSEMIDE 10 MG/ML IJ SOLN
20.0000 mg | Freq: Two times a day (BID) | INTRAMUSCULAR | Status: AC
  Administered 2017-11-13 (×2): 20 mg via INTRAVENOUS
  Filled 2017-11-13 (×3): qty 2

## 2017-11-13 MED ORDER — ORAL CARE MOUTH RINSE
15.0000 mL | Freq: Two times a day (BID) | OROMUCOSAL | Status: DC
  Administered 2017-11-13 – 2017-11-25 (×22): 15 mL via OROMUCOSAL

## 2017-11-13 MED ORDER — AMIODARONE HCL IN DEXTROSE 360-4.14 MG/200ML-% IV SOLN
60.0000 mg/h | INTRAVENOUS | Status: AC
  Administered 2017-11-13 (×2): 60 mg/h via INTRAVENOUS
  Filled 2017-11-13 (×2): qty 200

## 2017-11-13 MED ORDER — AMIODARONE LOAD VIA INFUSION
150.0000 mg | Freq: Once | INTRAVENOUS | Status: AC
  Administered 2017-11-13: 150 mg via INTRAVENOUS
  Filled 2017-11-13: qty 83.34

## 2017-11-13 MED ORDER — AMIODARONE HCL IN DEXTROSE 360-4.14 MG/200ML-% IV SOLN
30.0000 mg/h | INTRAVENOUS | Status: DC
  Administered 2017-11-14 – 2017-11-15 (×3): 30 mg/h via INTRAVENOUS
  Filled 2017-11-13 (×4): qty 200

## 2017-11-13 MED ORDER — POTASSIUM CHLORIDE 10 MEQ/50ML IV SOLN
10.0000 meq | INTRAVENOUS | Status: AC
  Administered 2017-11-13 (×3): 10 meq via INTRAVENOUS

## 2017-11-13 MED FILL — Heparin Sodium (Porcine) Inj 1000 Unit/ML: INTRAMUSCULAR | Qty: 10 | Status: AC

## 2017-11-13 MED FILL — Mannitol IV Soln 20%: INTRAVENOUS | Qty: 500 | Status: AC

## 2017-11-13 MED FILL — Lidocaine HCl IV Inj 20 MG/ML: INTRAVENOUS | Qty: 5 | Status: AC

## 2017-11-13 MED FILL — Thrombin (Recombinant) For Soln 5000 Unit: CUTANEOUS | Qty: 5000 | Status: AC

## 2017-11-13 MED FILL — Electrolyte-R (PH 7.4) Solution: INTRAVENOUS | Qty: 5000 | Status: AC

## 2017-11-13 MED FILL — Albumin, Human Inj 5%: INTRAVENOUS | Qty: 500 | Status: AC

## 2017-11-13 MED FILL — Sodium Bicarbonate IV Soln 8.4%: INTRAVENOUS | Qty: 50 | Status: AC

## 2017-11-13 MED FILL — Sodium Chloride IV Soln 0.9%: INTRAVENOUS | Qty: 2000 | Status: AC

## 2017-11-13 NOTE — Progress Notes (Addendum)
Patient ID: GENAVIEVE MANGIAPANE, female   DOB: [birth year], 66 y.o.   MRN: [id number] [facility] DAILY ICU PROGRESS NOTE                   [address]            [place] [address]          [phone]   1 Day Post-Op Procedure(s) (LRB): REDO STERNOTOMY (N/A) AORTIC VALVE REPLACEMENT (AVR)  USING 19 MM MAGNA EASE PERICARDIAL BIOPROSTHESIS-AORTIC, MODEL 3300TFX (N/A) MITRAL VALVE (MV) REPLACEMENT USING 25 MM MAGNA MITRAL EASE PERICARDIAL BIOPROSTHESIS, MODEL 7300TFX (N/A) TRANSESOPHAGEAL ECHOCARDIOGRAM (TEE) (N/A) ARTERIAL LINE INSERTION (Right)  Total Length of Stay:  LOS: 1 day   Subjective: Patient neurologically intact, has been slow to wean the ventilator earlier could not do weaning parameters, now more alert lifts her head follows complex commands and is alert  Objective: Vital signs in last 24 hours: Temp:  [96.6 F (35.9 C)-98.8 F (37.1 C)] 98.6 F (37 C) (12/06 0700) Pulse Rate:  [77-90] 89 (12/06 0700) Cardiac Rhythm: Atrial paced (12/06 0753) Resp:  [10-27] 16 (12/06 0700) BP: (88-105)/(37-70) 91/39 (12/06 0700) SpO2:  [96 %-100 %] 100 % (12/06 0823) Arterial Line BP: (88-143)/(13-67) 99/42 (12/06 0700) FiO2 (%):  [40 %-50 %] 40 % (12/06 0823)  Filed Weights    Weight change:    Hemodynamic parameters for last 24 hours: PAP: (31-89)/(13-29) 43/19 CO:  [6.3 L/min-7.9 L/min] 7.2 L/min CI:  [3.6 L/min/m2-4.5 L/min/m2] 4.1 L/min/m2  Intake/Output from previous day: 12/05 0701 - 12/06 0700 In: 7706.7 [I.V.:4116.7; Blood:2310; NG/GT:30; IV Piggyback:1250] Out: 7341 [PFXTK:2409; Blood:1200; Chest Tube:300]  Intake/Output this shift: Total I/O In: 76.7 [I.V.:76.7] Out: 75 [Urine:45; Chest Tube:30]  Current Meds: Scheduled Meds: . aspirin EC  325 mg Oral Daily   Or  . aspirin  324 mg Per Tube Daily  . atorvastatin  40 mg Oral q1800  . bisacodyl  10 mg Oral Daily   Or  . bisacodyl  10 mg Rectal Daily  . chlorhexidine gluconate (MEDLINE KIT)  15  mL Mouth Rinse BID  . Chlorhexidine Gluconate Cloth  6 each Topical Daily  . docusate sodium  200 mg Oral Daily  . DULoxetine  90 mg Oral Q lunch  . [START ON 11/14/2017] ferrous sulfate  325 mg Oral Q breakfast  . insulin regular  0-10 Units Intravenous TID WC  . levothyroxine  50 mcg Oral QAC breakfast  . mouth rinse  15 mL Mouth Rinse 10 times per day  . metoprolol tartrate  12.5 mg Oral BID   Or  . metoprolol tartrate  12.5 mg Per Tube BID  . [START ON 11/14/2017] pantoprazole  40 mg Oral Daily  . sodium chloride flush  10-40 mL Intracatheter Q12H  . sodium chloride flush  3 mL Intravenous Q12H   Continuous Infusions: . sodium chloride 20 mL/hr at 11/13/17 0700  . sodium chloride    . sodium chloride Stopped (11/13/17 0700)  . albumin human    . cefUROXime (ZINACEF)  IV Stopped (11/12/17 2200)  . dexmedetomidine Stopped (11/13/17 0130)  . DOPamine 3 mcg/kg/min (11/13/17 0600)  . famotidine (PEPCID) IV Stopped (11/12/17 2000)  . insulin (NOVOLIN-R) infusion 2.1 Units/hr (11/13/17 0800)  . lactated ringers    . lactated ringers    . lactated ringers 20 mL (11/12/17 2220)  . milrinone 0.3 mcg/kg/min (11/13/17 0600)  . nitroGLYCERIN    . phenylephrine (NEO-SYNEPHRINE) Adult infusion 70 mcg/min (  11/13/17 0645)  . tranexamic acid (CYKLOKAPRON) infusion (TRAUMA)     PRN Meds:.sodium chloride, albumin human, lactated ringers, metoprolol tartrate, midazolam, mometasone-formoterol, morphine injection, ondansetron (ZOFRAN) IV, oxyCODONE, sodium chloride flush, sodium chloride flush, traMADol  General appearance: alert, cooperative and no distress Neurologic: intact Heart: regular rate and rhythm, S1, S2 normal, no murmur, click, rub or gallop Lungs: diminished breath sounds bibasilar Abdomen: soft, non-tender; bowel sounds normal; no masses,  no organomegaly Extremities: extremities normal, atraumatic, no cyanosis or edema and Homans sign is negative, no sign of DVT Wound: Incisional  wound VAC in place  Lab Results: CBC: Recent Labs    11/12/17 1844  11/13/17 0201 11/13/17 0203  WBC 6.5  --   --  6.5  HGB 8.8*   < > 8.5* 8.7*  HCT 27.9*   < > 25.0* 27.5*  PLT 59*  --   --  51*   < > = values in this interval not displayed.   BMET:  Recent Labs    11/10/17 1437  11/13/17 0201 11/13/17 0203  NA 133*   < > 141 140  K 3.9   < > 3.6 3.6  CL 106   < > 106 109  CO2 17*  --   --  24  GLUCOSE 334*   < > 156* 163*  BUN 13   < > 16 17  CREATININE 0.92   < > 0.80 0.93  CALCIUM 8.2*  --   --  7.6*   < > = values in this interval not displayed.    CMET: Lab Results  Component Value Date   WBC 6.5 11/13/2017   HGB 8.7 (L) 11/13/2017   HCT 27.5 (L) 11/13/2017   PLT 51 (L) 11/13/2017   GLUCOSE 163 (H) 11/13/2017   CHOL 121 10/18/2017   TRIG 61 10/18/2017   HDL 33 (L) 10/18/2017   LDLCALC 76 10/18/2017   ALT 44 11/10/2017   AST 93 (H) 11/10/2017   NA 140 11/13/2017   K 3.6 11/13/2017   CL 109 11/13/2017   CREATININE 0.93 11/13/2017   BUN 17 11/13/2017   CO2 24 11/13/2017   TSH 2.664 10/15/2017   INR 1.68 11/12/2017   HGBA1C 7.5 (H) 11/10/2017      PT/INR:  Recent Labs    11/12/17 1844  LABPROT 19.7*  INR 1.68   Radiology: Dg Chest Port 1 View  Result Date: 11/13/2017 CLINICAL DATA:  Postop EXAM: PORTABLE CHEST 1 VIEW COMPARISON:  11/12/2017 FINDINGS: Cardiomegaly with vascular congestion and bibasilar atelectasis. Improving aeration since prior study. No pneumothorax. Support devices are unchanged. IMPRESSION: Improving aeration. Continued vascular congestion and bibasilar atelectasis. Electronically Signed   By: Rolm Baptise M.D.   On: 11/13/2017 08:15   Dg Chest Port 1 View  Result Date: 11/12/2017 CLINICAL DATA:  Endotracheal tube readjustment EXAM: PORTABLE CHEST 1 VIEW COMPARISON:  Portable exam 2014 hours compared to 1848 hours FINDINGS: Endotracheal tube is been partially withdrawn with tip now projecting satisfactory position  approximately 2.0 cm above carina. Nasogastric tube, mediastinal drain, and Swan-Ganz catheter unchanged. Enlargement of cardiac silhouette post MVR and AVR. Persistent bibasilar opacities, slightly increased question atelectasis. No pneumothorax. IMPRESSION: Increased bibasilar atelectasis. Satisfactory endotracheal tube position. Electronically Signed   By: Lavonia Dana M.D.   On: 11/12/2017 20:33   Dg Chest Port 1 View  Result Date: 11/12/2017 CLINICAL DATA:  Post MVR and AVR EXAM: PORTABLE CHEST 1 VIEW COMPARISON:  Portable exam 1848 hours compared to 11/10/2017  FINDINGS: Tip of endotracheal tube enters RIGHT mainstem bronchus recommend withdrawal 2.5 cm. Nasogastric tube extends into stomach. RIGHT jugular Swan-Ganz catheter with tip at main pulmonary artery bifurcation. Mediastinal drain present. Normal heart size post MVR and AVR. Mediastinal contours normal. Atherosclerotic calcification aorta. Bibasilar atelectasis. No pleural effusion or pneumothorax. IMPRESSION: Tip of endotracheal tube within RIGHT mainstem bronchus recommend withdrawal 2.5 cm. Remaining line and tube positions as above. Bibasilar atelectasis greater on LEFT. Findings called to [facility] [address] on 11/12/2017 at [place] [facility]. Electronically Signed   By: Lavonia Dana M.D.   On: 11/12/2017 19:30     Assessment/Plan: S/P Procedure(s) (LRB): REDO STERNOTOMY (N/A) AORTIC VALVE REPLACEMENT (AVR)  USING 19 MM MAGNA EASE PERICARDIAL BIOPROSTHESIS-AORTIC, MODEL 3300TFX (N/A) MITRAL VALVE (MV) REPLACEMENT USING 25 MM MAGNA MITRAL EASE PERICARDIAL BIOPROSTHESIS, MODEL 7300TFX (N/A) TRANSESOPHAGEAL ECHOCARDIOGRAM (TEE) (N/A) ARTERIAL LINE INSERTION (Right) Mobilize Diuresis See progression orders Continue vent weaning and hope to get extubated this a.m. Minimal chest tube output Renal function appears stable History of cirrhosis we will check ammonia and LFTs Expected Acute  Blood - loss Anemia- continue to monitor  Significant  thrombocytopenia, will avoid heparin use at this point, DVT prophylaxis with mechanical means only Continue low-dose dopamine and milrinone   Ammonia level mildly elevated at 56 this morning  [name] 11/13/2017 8:33 AM

## 2017-11-13 NOTE — Care Management Note (Signed)
Case Management Note Marvetta Gibbons RN, BSN Unit 4E-Case Manager-- [place] coverage [phone]  Patient Details  Name: Denise Cuevas MRN: [id number] Date of Birth: [birth year]  Subjective/Objective:  Pt admitted s/p AVR/MVR with redo sternotomy - pt extubated off vent this am 12/6                Action/Plan: PTA PT lived at home with spouse- CM to follow for transition to home needs-   Expected Discharge Date:                  Expected Discharge Plan:  Home/Self Care  In-House Referral:     Discharge planning Services  CM Consult  Post Acute Care Choice:    Choice offered to:     DME Arranged:    DME Agency:     HH Arranged:    HH Agency:     Status of Service:  In process, will continue to follow  If discussed at Long Length of Stay Meetings, dates discussed:    Discharge Disposition:   Additional Comments:  Dawayne Allexa, RN 11/13/2017, 11:05 AM

## 2017-11-13 NOTE — Procedures (Signed)
Extubation Procedure Note  Patient Details:   Name: Denise Cuevas DOB: 1951-12-03 MRN: [id number]   Airway Documentation:  Airway 7 mm (Active)  Secured at (cm) 21 cm 11/13/2017  8:05 AM  Measured From Lips 11/13/2017  8:05 AM  Secured Location Left 11/13/2017  8:05 AM  Secured By [employer] 11/13/2017  8:05 AM  Tube Holder Repositioned Yes 11/13/2017  8:05 AM  Cuff Pressure (cm H2O) 24 cm H2O 11/13/2017  8:05 AM  Site Condition Dry 11/13/2017  6:29 AM    Evaluation  O2 sats: stable throughout and currently acceptable Complications: No apparent complications Patient did tolerate procedure well. Bilateral Breath Sounds: Clear, Diminished   Yes   NIF -33, VC 605  [name] 11/13/2017, 8:49 AM

## 2017-11-13 NOTE — Progress Notes (Signed)
First wean attempt failed. Completed 40/4 and CPAP trial. (Sleeping entire time, but breathing well.)  ABG within parameters, Nif within parameters, unable to obtain appropriate VC. Unable to complete VC due to her falling asleep during parameter trial, despite repeated coaching. Tongue swelling has improved, and she is able to keep her tongue inside her mouth. Cuff leak present. Will allow to rest at this time and will reassess readiness to wean. Richarda Blade RN

## 2017-11-13 NOTE — Discharge Summary (Signed)
Physician Discharge Summary       [address]       [place] [address]             [phone]    Patient ID: Denise Cuevas MRN: [id number] DOB/AGE: [birth year] 66 y.o.  Admit date: 11/12/2017 Discharge date: 11/26/2017  Admission Diagnoses: 1. 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 Coronary artery disease-CABG 2. CHF (congestive heart failure) ([place]) 3. Diabetes mellitus-type 2 4. Hypercholesteremia 5. Hypertension 6. Hypothyroid 7. Myocardial infarction (HCC)-MI x2- [phone] coronary stent(chest pain episode at time) 8. PONV (postoperative nausea and vomiting) 9. Cirrhosis of liver (HCC)-idiopathic 10. Depression 11. GERD (gastroesophageal reflux disease) 12. H/O hiatal hernia 13. Headache(784.0)-occ. generalized 14. Asthma 15. Atrial fibrillation 16.Thrombocytopenia 17. Elevated transaminase, bilirubin  Consults: GI  Procedure (s):  Redo sternotomy; cardiopulmonary bypass with replacement of aortic valve with [employer] pericardial tissue valve, model 3300TFX, 19 mm, serial #[id number], and mitral valve replacement with [facility] pericardial tissue valve, model 7300TFX, 25 mm, serial #[id number]; placement of right femoral arterial line by Dr. Servando Snare on 11/12/2017.   History of Presenting Illness: This is a 66 year old Caucasian female with a past medical history of coronary artery disease (s/p  and CABG x 2 11/2011 and stents placed 2013), diabetes mellitus, non alcoholic cirrhosis, asthma,  who presented on 10/15/2017 with complaints of intermittent non exertional chest pain, worsening shortness of breath, fatigue, and cough. She states she had intermittent dizziness as well.  Regarding her cough, she went to [place] who told her she had acute bronchitis and COPD. She was given a course of Azithromycin, without much improvement in her symptoms.   She  then presented to her cardiologist's office on 10/15/2017 . She presented with complaints of "something is wrong but unsure of what".  Specifically, she had had intermittent chest pain (both with exertion and at rest) and was sleepy and tired. In addition, she had complaints of leg redness which she used hydrocortisone cream with some improvement. She was admitted to [facility] for further evaluation and treatment. EKG apparently showed no acute ST changes.  Initial and subsequent Troponin I was less than 0.03. Echo done 10/16/2017 showed LVEF 60-65%,  moderate AS, moderate MR, and moderate to severe MS. A cardiac catheterization was done on 10/16/2017 and showed previously placed Ost Cx to Prox Cx stent (unknown type) is widely patent, previously placed 2nd Mrg stent (unknown type) is widely patent, Ost 2nd Mrg lesion is 50% stenosed, prox LAD lesion is 100% stenosed, previously placed Mid LAD to Dist LAD stent (unknown type) is widely patent, moderate aortic valve stenosis, and moderate mitral valve stenosis. A cardiothoracic consultation was requested , the patient stabilized and was discharged home to be seen in the office .  She comes into the office today feels much better than when she was in the hospital , she continues on diuretic therapy and notes she has lost approximately 15 pounds .   Since seen last week the patient has continued to lose several more pounds, she notes she feels much better denies shortness of breath, and especially notes that the edema in her lower extremities is much improved.  Dr. Servando Snare further discussed with the patient proceeding with redo sternotomy and aortic and mitral valve replacement.  The vein graft to the circumflex is occluded, the mammary to the LAD is patent.  Depending on the degree of scarring  and difficulty we can consider a vein graft to the circumflex, but that does involve significantly more dissection away from the areas required for aortic valve  replacement and mitral valve replacement.  With the patient's underlying cirrhosis, she wished to avoid long-term Coumadin and she prefers tissue valves.  She was cleared by dentist last week. Dr. Servando Snare discussed potential risks, benefits, and complications of the surgery with the patient and she agreed to proceed with surgery. Pre operative carotid duplex US showed no significant internal carotid artery stenosis bilaterally. She was admitted on 11/12/2017 in order to undergo a redo median sternotomy, aortic and mitral valve replacements. Because of the requirement of more significant dissection, a vein graft to the circumflex was not done.   Brief Hospital Course:  The patient was extubated the evening of surgery without difficulty. She remained afebrile and hemodynamically stable. She was gradually weaned of Milrinone, Dopamine, and Neo Synephrine drips. She did go into a fib with RVR early the am on 11/14/2017. She was then put on an Amiodarone drip. [name], a line, chest tubes, and foley were removed early in the post operative course. Lopressor was started and titrated accordingly. She was volume over loaded and diuresed. She had ABL anemia.  Last H and H was 8.7.  She also had thrombocytopenia. Her last platelet count was 63. HIT was checked and was negative.  She was started on Coumadin. Her PT and INR were monitored daily. Her last PT and INR were 31 and 3.01. Coumadin will be held the evening of 12/19 and she will start 1 mg of Coumadin on 11/27/2017. She was weaned off the insulin drip.She remained on Insulin post op. [name] will be restarted at discharge.  The patient's glucose remained well controlled on Insulin. [name] will be restarted at discharge. The patient's HGA1C pre op was 7.5. The patient was felt surgically stable for transfer from the ICU to [facility] for further convalescence on 11/21/2017. Lopressor was stopped secondary to labile BP.  She was restarted on Spironolactone as take  pre op. Amiodarone has been decreased to 200 mg daily. She continues to progress with cardiac rehab.She was ambulating on room air. She has been tolerating a diet and has had a bowel movement. Of note, her AST was elevated at 67 and total bilirubin was elevated at 4.1. As a result, statin was discontinued. As discussed with Dr. Prescott Gum, epicardial pacing wires were removed on 11/22/2017. Home health and home PT have been arranged. Chest tube sutures will be removed 11/26/2017. The patient is felt surgically stable for discharge today.   Latest Vital Signs: Blood pressure (!) 112/40, pulse 82, temperature 98.1 F (36.7 C), temperature source Oral, resp. rate 18, height 5\' 2"  (1.575 m), weight 180 lb 16 oz (82.1 kg), SpO2 100 %.  Physical Exam: Cardiovascular: RRR, flow murmur Pulmonary: Mostly clear Abdomen: Soft, non tender, bowel sounds present. Extremities: Pitting bilateral lower extremity edema but is decreasing. Ecchymosis right thigh and partial lower leg. Wounds: RLE wounds are clean and dry. No sign of infection.  Discharge Condition:Stable and discharged to home.  Recent laboratory studies:  Lab Results  Component Value Date   WBC 7.2 11/24/2017   HGB 8.7 (L) 11/24/2017   HCT 26.8 (L) 11/24/2017   MCV 84.3 11/24/2017   PLT 63 (L) 11/24/2017   Lab Results  Component Value Date   NA 131 (L) 11/23/2017   K 3.7 11/23/2017   CL 100 (L) 11/23/2017   CO2 24 11/23/2017  CREATININE 0.94 11/23/2017   GLUCOSE 125 (H) 11/23/2017    Diagnostic Studies: Dg Chest 2 View  Result Date: 11/23/2017 CLINICAL DATA:  Pleural effusion EXAM: CHEST  2 VIEW COMPARISON:  11/21/2017 FINDINGS: Moderate cardiomegaly. Vascular congestion. Low volumes. Bibasilar atelectasis. Small pleural effusions. Postoperative changes from aortic and mitral valve replacement IMPRESSION: Cardiomegaly and vascular congestion. Bibasilar atelectasis. Small bilateral pleural effusions Stable. Electronically Signed    By: Marybelle Killings M.D.   On: 11/23/2017 11:04   US Abdomen Limited Ruq  Result Date: 11/18/2017 CLINICAL DATA:  Abnormal LFTs. EXAM: ULTRASOUND ABDOMEN LIMITED RIGHT UPPER QUADRANT COMPARISON:  10/10/2017. FINDINGS: Gallbladder: Cholecystectomy. Common bile duct: Diameter: 4.4 mm Liver: Coarse echotexture with lobulated contour suggesting cirrhosis. No focal hepatic abnormality identified. Portal vein is patent on color Doppler imaging with questionable bidirectional blood flow. Mild ascites. Right pleural effusion noted. Prominent amount of bowel gas present. IMPRESSION: 1. Cholecystectomy.  No biliary distention. 2. The liver has a coarse echotexture with lobular contour suggesting cirrhosis. Question bidirectional portal venous flow also noted. 3. Mild ascites.  Right-sided pleural effusion. 4. Prominent amount of bowel gas. Electronically Signed   By: Marcello Moores  Register   On: 11/18/2017 15:25   Discharge Instructions    Amb Referral to Cardiac Rehabilitation   Complete by:  As directed    Diagnosis:  Valve Replacement   Valve:   Aortic Mitral        Discharge Medications: Allergies as of 11/26/2017      Reactions   Exenatide Nausea And Vomiting   Ace Inhibitors Other (See Comments)   pseudoasthma      Medication List    STOP taking these medications   atorvastatin 40 MG tablet Commonly known as:  LIPITOR   HYDROcodone-homatropine 5-1.5 MG/5ML syrup Commonly known as:  HYCODAN   metoprolol tartrate 25 MG tablet Commonly known as:  LOPRESSOR   nitroGLYCERIN 0.4 MG SL tablet Commonly known as:  NITROSTAT     TAKE these medications   albuterol (2.5 MG/3ML) 0.083% nebulizer solution Commonly known as:  PROVENTIL Take 3 mLs (2.5 mg total) by nebulization every 2 (two) hours as needed for wheezing.   albuterol 108 (90 Base) MCG/ACT inhaler Commonly known as:  PROVENTIL HFA;VENTOLIN HFA Inhale 2 puffs into the lungs every 6 (six) hours as needed for wheezing or shortness of  breath.   amiodarone 200 MG tablet Commonly known as:  PACERONE Take 1 tablet (200 mg total) by mouth daily.   aspirin 81 MG tablet Take 81 mg by mouth daily.   budesonide-formoterol 80-4.5 MCG/ACT inhaler Commonly known as:  SYMBICORT Inhale 2 puffs into the lungs 2 (two) times daily as needed (shortness of breath).   DULoxetine 30 MG capsule Commonly known as:  CYMBALTA Take 30 mg by mouth daily with lunch. In conjunction with one 60 mg capsule to equal a total of 90 milligrams   DULoxetine 60 MG capsule Commonly known as:  CYMBALTA Take 60 mg by mouth daily with lunch. In conjunction with one 30 mg capsule to equal a total of 90 milligrams   ferrous sulfate 325 (65 FE) MG tablet Take 1 tablet (325 mg total) daily with breakfast by mouth.   furosemide 40 MG tablet Commonly known as:  LASIX Take 1 tablet (40 mg total) by mouth 2 (two) times daily. For one week;then take Lasix 40 mg by mouth daily thereafter What changed:    when to take this  additional instructions   HUMALOG [place] (  75-25) 100 UNIT/ML Kwikpen Generic drug:  Insulin Lispro Prot & Lispro INJECT 60 UNITS SUBCUTANEOUSLY BEFORE BREAKFAST AND 60 UNITS BEFORE EVENING MEAL   JARDIANCE 25 MG Tabs tablet Generic drug:  empagliflozin Take 25 mg daily by mouth.   levothyroxine 50 MCG tablet Commonly known as:  SYNTHROID, LEVOTHROID Take 50 mcg by mouth daily before breakfast.   mometasone-formoterol 100-5 MCG/ACT Aero Commonly known as:  DULERA Inhale 2 puffs into the lungs 2 (two) times daily as needed for wheezing or shortness of breath.   omeprazole-sodium bicarbonate 40-1100 MG capsule Commonly known as:  ZEGERID Take 1 capsule by mouth daily before breakfast.   potassium chloride SA 20 MEQ tablet Commonly known as:  K-DUR,KLOR-CON Take 1 tablet (20 mEq total) by mouth daily.   spironolactone 25 MG tablet Commonly known as:  ALDACTONE Take 1 tablet (25 mg total) by mouth daily.     traMADol 50 MG tablet Commonly known as:  ULTRAM Take 1 tablet (50 mg total) by mouth every 6 (six) hours as needed for moderate pain.   warfarin 1 MG tablet Commonly known as:  COUMADIN Take 1 tablet (1 mg total) by mouth daily at 6 PM. Or as directed. Start taking on:  11/27/2017      The patient has been discharged on:   1.Beta Blocker:  Yes [   ]                              No   [ x  ]                              If No, reason:Hypotenstion  2.Ace Inhibitor/ARB: Yes [   ]                                     No  [  x  ]                                     If No, reason:Hypotension  3.Statin:   Yes [   ]                  No  [ x  ]                  If No, reason:Elevated transaminase, total bilirubin  4.Ecasa:  Yes  [ x  ]                  No   [   ]                  If No, reason:  Follow Up Appointments: Follow-up Information    [name], MD. Go on 12/29/2017.   Specialty:  Cardiothoracic Surgery Why:  PA/LAT CXR to be taken (at [facility] which is in the same building as Dr. Everrett Coombe office) on 12/29/2017 at 2:00 pm;Appointment time is at 2:30 pm Contact information: [place] [address] [phone]        [name], MD. Call.   Specialty:  Family Medicine Why:  for a follow up appointment of diabetes management and further surveillance of HGA1C 7.5 Contact information: [address] [address] [phone]        [place]. Go on 11/28/2017.   Specialty:  Cardiology Why:  appointment to have PT and INR drawn on Friday 11/28/2017 (as is on Coumadin for aortic and mitral valve replacements, goal 2-2.5). Appointment time is at 10:30 am Contact information: [address], [place]       Arta Silence, MD. Schedule an appointment as soon as possible for a visit in 4 week(s).   Specialty:   Gastroenterology Contact information: [address] [place] [address] [phone]        Consuelo Pandy, PA-C. Go on 12/15/2017.   Specialties:  Cardiology, Radiology Why:  Appointment time is at 3:00 pm Contact information: [place] [address] [phone]        [facility] Follow up.   Specialty:  [place] Why:  HHRN/PT arranged- they will call you to set up home visits Contact information: [address] [place] [address] [phone]           Signed: Sharalyn Ink [facility] 11/26/2017, 9:18 AM

## 2017-11-13 NOTE — Progress Notes (Signed)
Failed second wean attempt. Completed 40/4 and CPAP with no issues. ABG within parameters. Attempted to perform Nif and VC and patient refuses to participate despite coaching. Explained to patient's sister that we will be unable to extubate her at this time because she is unwilling to participate in parameters. Patient is refusing to open eyes when asked, and will not give any effort when Nif attached to ETT x3. Will place back on full support and continue to assess for readiness to wean. Richarda Blade RN

## 2017-11-13 NOTE — Progress Notes (Signed)
CT surgery p.m. Rounds  Patient had stable day, dangled on side of bed Chest tubes and PA catheter removed Stable vital signs P.m. labs are satisfactory

## 2017-11-13 NOTE — Progress Notes (Signed)
Rt attempted to wean X's 2. Pt unable to preform parameters. RT will pass on to day shift RT.

## 2017-11-14 ENCOUNTER — Inpatient Hospital Stay (HOSPITAL_COMMUNITY): Payer: Medicare Other

## 2017-11-14 LAB — GLUCOSE, CAPILLARY
Glucose-Capillary: 178 mg/dL — ABNORMAL HIGH (ref 65–99)
Glucose-Capillary: 178 mg/dL — ABNORMAL HIGH (ref 65–99)
Glucose-Capillary: 203 mg/dL — ABNORMAL HIGH (ref 65–99)
Glucose-Capillary: 202 mg/dL — ABNORMAL HIGH (ref 65–99)
Glucose-Capillary: 214 mg/dL — ABNORMAL HIGH (ref 65–99)
Glucose-Capillary: 207 mg/dL — ABNORMAL HIGH (ref 65–99)
Glucose-Capillary: 222 mg/dL — ABNORMAL HIGH (ref 65–99)
Glucose-Capillary: 186 mg/dL — ABNORMAL HIGH (ref 65–99)
Glucose-Capillary: 185 mg/dL — ABNORMAL HIGH (ref 65–99)
Glucose-Capillary: 194 mg/dL — ABNORMAL HIGH (ref 65–99)
Glucose-Capillary: 266 mg/dL — ABNORMAL HIGH (ref 65–99)

## 2017-11-14 LAB — COMPREHENSIVE METABOLIC PANEL
ALT: 75 U/L — ABNORMAL HIGH (ref 14–54)
AST: 186 U/L — ABNORMAL HIGH (ref 15–41)
Albumin: 2.8 g/dL — ABNORMAL LOW (ref 3.5–5.0)
Alkaline Phosphatase: 86 U/L (ref 38–126)
Anion gap: 8 (ref 5–15)
BUN: 23 mg/dL — ABNORMAL HIGH (ref 6–20)
CO2: 23 mmol/L (ref 22–32)
Calcium: 8.1 mg/dL — ABNORMAL LOW (ref 8.9–10.3)
Chloride: 107 mmol/L (ref 101–111)
Creatinine, Ser: 1.09 mg/dL — ABNORMAL HIGH (ref 0.44–1.00)
GFR calc Af Amer: >60 mL/min (ref >60)
GFR calc non Af Amer: 52 mL/min — ABNORMAL LOW (ref >60)
Glucose, Bld: 214 mg/dL — ABNORMAL HIGH (ref 65–99)
Potassium: 3.6 mmol/L (ref 3.5–5.1)
Sodium: 138 mmol/L (ref 135–145)
Total Bilirubin: 3.7 mg/dL — ABNORMAL HIGH (ref 0.3–1.2)
Total Protein: 4.3 g/dL — ABNORMAL LOW (ref 6.5–8.1)

## 2017-11-14 LAB — COOXEMETRY PANEL
Carboxyhemoglobin: 1.9 % — ABNORMAL HIGH (ref 0.5–1.5)
Methemoglobin: 0.9 % (ref 0.0–1.5)
O2 Saturation: 75.7 %
Total hemoglobin: 8.5 g/dL — ABNORMAL LOW (ref 12.0–16.0)

## 2017-11-14 LAB — MAGNESIUM: Magnesium: 2.4 mg/dL (ref 1.7–2.4)

## 2017-11-14 LAB — CBC
HCT: 28.5 % — ABNORMAL LOW (ref 36.0–46.0)
Hemoglobin: 8.9 g/dL — ABNORMAL LOW (ref 12.0–15.0)
MCH: 24.9 pg — ABNORMAL LOW (ref 26.0–34.0)
MCHC: 31.2 g/dL (ref 30.0–36.0)
MCV: 79.6 fL (ref 78.0–100.0)
Platelets: 56 10*3/uL — ABNORMAL LOW (ref 150–400)
RBC: 3.58 MIL/uL — ABNORMAL LOW (ref 3.87–5.11)
RDW: 21.7 % — ABNORMAL HIGH (ref 11.5–15.5)
WBC: 11.9 10*3/uL — ABNORMAL HIGH (ref 4.0–10.5)

## 2017-11-14 LAB — PROTIME-INR
INR: 1.55
Prothrombin Time: 18.4 seconds — ABNORMAL HIGH (ref 11.4–15.2)

## 2017-11-14 LAB — AMMONIA: Ammonia: 62 umol/L — ABNORMAL HIGH (ref 9–35)

## 2017-11-14 MED ORDER — LACTULOSE 10 GM/15ML PO SOLN
10.0000 g | Freq: Three times a day (TID) | ORAL | Status: AC
  Administered 2017-11-14 (×2): 10 g via ORAL
  Filled 2017-11-14 (×3): qty 15

## 2017-11-14 MED ORDER — FUROSEMIDE 10 MG/ML IJ SOLN
40.0000 mg | Freq: Once | INTRAMUSCULAR | Status: AC
  Administered 2017-11-14: 40 mg via INTRAVENOUS
  Filled 2017-11-14: qty 4

## 2017-11-14 MED ORDER — POTASSIUM CHLORIDE CRYS ER 20 MEQ PO TBCR
20.0000 meq | EXTENDED_RELEASE_TABLET | ORAL | Status: AC | PRN
  Administered 2017-11-14 – 2017-11-16 (×2): 20 meq via ORAL
  Filled 2017-11-14 (×2): qty 1

## 2017-11-14 MED ORDER — POTASSIUM CHLORIDE 10 MEQ/50ML IV SOLN
10.0000 meq | INTRAVENOUS | Status: AC
  Administered 2017-11-14 (×4): 10 meq via INTRAVENOUS
  Filled 2017-11-14 (×4): qty 50

## 2017-11-14 MED ORDER — TRAMADOL HCL 50 MG PO TABS
50.0000 mg | ORAL_TABLET | Freq: Four times a day (QID) | ORAL | Status: DC | PRN
  Administered 2017-11-14 – 2017-11-20 (×5): 50 mg via ORAL
  Filled 2017-11-14 (×5): qty 1

## 2017-11-14 MED ORDER — INSULIN DETEMIR 100 UNIT/ML ~~LOC~~ SOLN
10.0000 [IU] | Freq: Once | SUBCUTANEOUS | Status: AC
  Administered 2017-11-14: 10 [IU] via SUBCUTANEOUS
  Filled 2017-11-14: qty 0.1

## 2017-11-14 MED ORDER — INSULIN ASPART 100 UNIT/ML ~~LOC~~ SOLN
0.0000 [IU] | SUBCUTANEOUS | Status: DC
  Administered 2017-11-14: 12 [IU] via SUBCUTANEOUS
  Administered 2017-11-14 – 2017-11-15 (×3): 4 [IU] via SUBCUTANEOUS
  Administered 2017-11-15 (×2): 2 [IU] via SUBCUTANEOUS
  Administered 2017-11-15 (×2): 4 [IU] via SUBCUTANEOUS
  Administered 2017-11-15: 2 [IU] via SUBCUTANEOUS
  Administered 2017-11-16 (×2): 4 [IU] via SUBCUTANEOUS
  Administered 2017-11-16: 2 [IU] via SUBCUTANEOUS
  Administered 2017-11-17: 4 [IU] via SUBCUTANEOUS
  Administered 2017-11-17: 8 [IU] via SUBCUTANEOUS
  Administered 2017-11-17: 2 [IU] via SUBCUTANEOUS
  Administered 2017-11-17: 8 [IU] via SUBCUTANEOUS
  Administered 2017-11-17 (×2): 2 [IU] via SUBCUTANEOUS
  Administered 2017-11-17 – 2017-11-18 (×2): 4 [IU] via SUBCUTANEOUS
  Administered 2017-11-18: 2 [IU] via SUBCUTANEOUS
  Administered 2017-11-18: 8 [IU] via SUBCUTANEOUS
  Administered 2017-11-18: 2 [IU] via SUBCUTANEOUS
  Administered 2017-11-18: 8 [IU] via SUBCUTANEOUS
  Administered 2017-11-18 – 2017-11-19 (×3): 2 [IU] via SUBCUTANEOUS
  Administered 2017-11-19: 4 [IU] via SUBCUTANEOUS
  Administered 2017-11-19: 2 [IU] via SUBCUTANEOUS
  Administered 2017-11-19 – 2017-11-20 (×3): 4 [IU] via SUBCUTANEOUS
  Administered 2017-11-20 (×3): 2 [IU] via SUBCUTANEOUS
  Administered 2017-11-20: 8 [IU] via SUBCUTANEOUS
  Administered 2017-11-21: 4 [IU] via SUBCUTANEOUS
  Administered 2017-11-21 (×2): 2 [IU] via SUBCUTANEOUS

## 2017-11-14 MED ORDER — INSULIN ASPART 100 UNIT/ML ~~LOC~~ SOLN: 0.0000 [IU] | SUBCUTANEOUS | Status: DC

## 2017-11-14 MED FILL — Potassium Chloride Inj 2 mEq/ML: INTRAVENOUS | Qty: 20 | Status: AC

## 2017-11-14 MED FILL — Magnesium Sulfate Inj 50%: INTRAMUSCULAR | Qty: 10 | Status: AC

## 2017-11-14 MED FILL — Heparin Sodium (Porcine) Inj 1000 Unit/ML: INTRAMUSCULAR | Qty: 30 | Status: AC

## 2017-11-14 NOTE — Progress Notes (Addendum)
TCTS DAILY ICU PROGRESS NOTE                   [address]            [place] [address]          [phone]   2 Days Post-Op Procedure(s) (LRB): REDO STERNOTOMY (N/A) AORTIC VALVE REPLACEMENT (AVR)  USING 19 MM MAGNA EASE PERICARDIAL BIOPROSTHESIS-AORTIC, MODEL 3300TFX (N/A) MITRAL VALVE (MV) REPLACEMENT USING 25 MM MAGNA MITRAL EASE PERICARDIAL BIOPROSTHESIS, MODEL 7300TFX (N/A) TRANSESOPHAGEAL ECHOCARDIOGRAM (TEE) (N/A) ARTERIAL LINE INSERTION (Right)  Total Length of Stay:  LOS: 2 days   Subjective: More agitated over night and earlier this am. She was in mittens and they were just removed. She is a little more cooperative now.  Objective: Vital signs in last 24 hours: Temp:  [98.6 F (37 C)-99.2 F (37.3 C)] 99.2 F (37.3 C) (12/07 0000) Pulse Rate:  [80-148] 89 (12/07 0700) Cardiac Rhythm: Atrial paced (12/07 0400) Resp:  [0-30] 16 (12/07 0700) BP: (83-139)/(26-107) 139/55 (12/07 0700) SpO2:  [91 %-100 %] 99 % (12/07 0700) Arterial Line BP: (60-144)/(31-123) 125/46 (12/07 0700) FiO2 (%):  [40 %] 40 % (12/06 0823) Weight:  [180 lb 12.4 oz (82 kg)-185 lb 10 oz (84.2 kg)] 185 lb 10 oz (84.2 kg) (12/07 0500)  Filed Weights   11/13/17 1115 11/14/17 0500  Weight: 180 lb 12.4 oz (82 kg) 185 lb 10 oz (84.2 kg)    Weight change:    Hemodynamic parameters for last 24 hours: PAP: (38-60)/(11-25) 57/24 CO:  [8.9 L/min-10.4 L/min] 10.4 L/min CI:  [5 L/min/m2-5.9 L/min/m2] 5.9 L/min/m2  Intake/Output from previous day: 12/06 0701 - 12/07 0700 In: 3403.4 [P.O.:480; I.V.:2773.4; IV Piggyback:150] Out: 2085 [Urine:1915; Chest Tube:170]  Intake/Output this shift: No intake/output data recorded.  Current Meds: Scheduled Meds: . aspirin EC  325 mg Oral Daily   Or  . aspirin  324 mg Per Tube Daily  . atorvastatin  40 mg Oral q1800  . bisacodyl  10 mg Oral Daily   Or  . bisacodyl  10 mg Rectal Daily  . Chlorhexidine Gluconate Cloth  6 each Topical  Daily  . docusate sodium  200 mg Oral Daily  . DULoxetine  90 mg Oral Q lunch  . ferrous sulfate  325 mg Oral Q breakfast  . insulin regular  0-10 Units Intravenous TID WC  . levothyroxine  50 mcg Oral QAC breakfast  . mouth rinse  15 mL Mouth Rinse BID  . metoprolol tartrate  12.5 mg Oral BID   Or  . metoprolol tartrate  12.5 mg Per Tube BID  . pantoprazole  40 mg Oral Daily  . sodium chloride flush  10-40 mL Intracatheter Q12H  . sodium chloride flush  3 mL Intravenous Q12H   Continuous Infusions: . sodium chloride Stopped (11/13/17 1900)  . sodium chloride    . sodium chloride Stopped (11/13/17 0700)  . amiodarone 30 mg/hr (11/14/17 0245)  . cefUROXime (ZINACEF)  IV Stopped (11/13/17 1749)  . dexmedetomidine Stopped (11/13/17 0130)  . DOPamine 3.007 mcg/kg/min (11/13/17 2000)  . insulin (NOVOLIN-R) infusion 16.4 Units/hr (11/14/17 0656)  . lactated ringers    . lactated ringers    . lactated ringers 20 mL/hr at 11/13/17 2000  . milrinone 0.3 mcg/kg/min (11/14/17 0431)  . nitroGLYCERIN    . phenylephrine (NEO-SYNEPHRINE) Adult infusion 70 mcg/min (11/14/17 0703)  . tranexamic acid (CYKLOKAPRON) infusion (TRAUMA)     PRN Meds:.sodium chloride, lactated  ringers, metoprolol tartrate, midazolam, mometasone-formoterol, morphine injection, ondansetron (ZOFRAN) IV, oxyCODONE, potassium chloride, sodium chloride flush, sodium chloride flush, traMADol  General appearance: no distress Neurologic: Slow to respond to commands but able to do so Heart: RRR,paced Lungs: Diminshed at bases Abdomen: Soft, non tender, sporadic bowel sounds  Extremities: SCDs in place Wound: Sternal dressing is clean and dry  Lab Results: CBC: Recent Labs    11/13/17 0203 11/13/17 1520 11/14/17 0308  WBC 6.5  --  11.9*  HGB 8.7* 9.2* 8.9*  HCT 27.5* 27.0* 28.5*  PLT 51*  --  56*   BMET:  Recent Labs    11/13/17 0203 11/13/17 1520 11/14/17 0308  NA 140 141 138  K 3.6 4.1 3.6  CL 109 106 107   CO2 24  --  23  GLUCOSE 163* 186* 214*  BUN 17 23* 23*  CREATININE 0.93 1.00 1.09*  CALCIUM 7.6*  --  8.1*    CMET: Lab Results  Component Value Date   WBC 11.9 (H) 11/14/2017   HGB 8.9 (L) 11/14/2017   HCT 28.5 (L) 11/14/2017   PLT 56 (L) 11/14/2017   GLUCOSE 214 (H) 11/14/2017   CHOL 121 10/18/2017   TRIG 61 10/18/2017   HDL 33 (L) 10/18/2017   LDLCALC 76 10/18/2017   ALT 75 (H) 11/14/2017   AST 186 (H) 11/14/2017   NA 138 11/14/2017   K 3.6 11/14/2017   CL 107 11/14/2017   CREATININE 1.09 (H) 11/14/2017   BUN 23 (H) 11/14/2017   CO2 23 11/14/2017   TSH 2.664 10/15/2017   INR 1.55 11/14/2017   HGBA1C 7.5 (H) 11/10/2017      PT/INR:  Recent Labs    11/14/17 0308  LABPROT 18.4*  INR 1.55   Radiology: No results found.   Assessment/Plan: S/P Procedure(s) (LRB): REDO STERNOTOMY (N/A) AORTIC VALVE REPLACEMENT (AVR)  USING 19 MM MAGNA EASE PERICARDIAL BIOPROSTHESIS-AORTIC, MODEL 3300TFX (N/A) MITRAL VALVE (MV) REPLACEMENT USING 25 MM MAGNA MITRAL EASE PERICARDIAL BIOPROSTHESIS, MODEL 7300TFX (N/A) TRANSESOPHAGEAL ECHOCARDIOGRAM (TEE) (N/A) ARTERIAL LINE INSERTION (Right)  1. CV-Previous a fib with RVR. SR,Paced at 90 this am. Co ox this am 75.7. INR 1.55 this am. On Milrinone, Neo synephrine, Amiodarone, and Dopamine drips. Wean drips as able. Also, on Lopressor 12.5 mg bid. Will need to start Coumadin (tissue AVR and MVR) but will defer to surgeon when to begin. 2. Pulmonary-On 4 liters of oxygen via [place]. Will wean to room air over next several days. CXR this am appears to show cardiomegaly, vascular congestion, bibasilar atelectasis, no pneumothorax. Encourage incentive spirometer.  3. Volume overload-Will give Lasix IV this am 4. ABL anemia-H and H 8.9 and 28.5 5. DM-CBGs 214/207/222. On Insulin drip but will transition to scheduled Insulin. Pre op HGA1C 7.5. Will restart Jardiance once tolerating oral better. 6. Thrombocytopenia-platelets 56,000. May need to  check HIT if decreases further. 7. Supplement potassium via IV 8. Ammonia slightly increased to 62 (see below) 9. Elevated transaminases-AST 186, ALT 75 (increased from pre op values), Alk phos 86. Has a history of cirrhosis of liver. Will recheck ammonia in am 10. GI- is at potential risk for aspiration so slowly advance diet   Donielle Liston Alba PA-C 11/14/2017 7:54 AM   Agree with above. Wean off milrinone since Co-ox 76% this am and preop EF normal.  She has been sleepy today. May be pain meds but her ammonia was 62 this am and slightly higher than yesterday. Will give her some lactulose.

## 2017-11-14 NOTE — Progress Notes (Signed)
Paged Dr. Prescott Gum after patient has become more agitated past couple of hours. Treated pain but no relief in agitation. Was picking at her wound vac on her sternal incision. Put mitts on her. She pulled her mitts off and got the top part of her wound vac unsealed. Orders to d/c wound vac and place sterile gauze pressure dressing on incision and bilateral soft wrist restraints. Will initiate orders and monitor patient closely.

## 2017-11-14 NOTE — Progress Notes (Signed)
Paged Dr. Prescott Gum 12/6 @ 2030 after EKG revealed afib RVR with HR 140-150s. Pressure stable on Neo. Ordered to bolus 150 mg of amiodarone and start gtt at 60mg /hr for 6 hrs and then 30mg /hr after that. Orders initiated. Will continue to monitor.

## 2017-11-15 ENCOUNTER — Inpatient Hospital Stay (HOSPITAL_COMMUNITY): Payer: Medicare Other

## 2017-11-15 LAB — BASIC METABOLIC PANEL
ANION GAP: 7 (ref 5–15)
BUN: 22 mg/dL — AB (ref 6–20)
CALCIUM: 8.1 mg/dL — AB (ref 8.9–10.3)
CO2: 22 mmol/L (ref 22–32)
Chloride: 107 mmol/L (ref 101–111)
Creatinine, Ser: 0.83 mg/dL (ref 0.44–1.00)
GFR calc Af Amer: >60 mL/min (ref >60)
GLUCOSE: 147 mg/dL — AB (ref 65–99)
POTASSIUM: 3.5 mmol/L (ref 3.5–5.1)
Sodium: 136 mmol/L (ref 135–145)

## 2017-11-15 LAB — GLUCOSE, CAPILLARY
Glucose-Capillary: 146 mg/dL — ABNORMAL HIGH (ref 65–99)
Glucose-Capillary: 173 mg/dL — ABNORMAL HIGH (ref 65–99)
Glucose-Capillary: 132 mg/dL — ABNORMAL HIGH (ref 65–99)
Glucose-Capillary: 172 mg/dL — ABNORMAL HIGH (ref 65–99)
Glucose-Capillary: 138 mg/dL — ABNORMAL HIGH (ref 65–99)

## 2017-11-15 LAB — CBC
HEMATOCRIT: 27.2 % — AB (ref 36.0–46.0)
HEMOGLOBIN: 8.6 g/dL — AB (ref 12.0–15.0)
MCH: 24.9 pg — AB (ref 26.0–34.0)
MCHC: 31.6 g/dL (ref 30.0–36.0)
MCV: 78.8 fL (ref 78.0–100.0)
Platelets: 40 10*3/uL — ABNORMAL LOW (ref 150–400)
RBC: 3.45 MIL/uL — ABNORMAL LOW (ref 3.87–5.11)
RDW: 21.5 % — AB (ref 11.5–15.5)
WBC: 7.9 10*3/uL (ref 4.0–10.5)

## 2017-11-15 LAB — COOXEMETRY PANEL
Carboxyhemoglobin: 1.7 % — ABNORMAL HIGH (ref 0.5–1.5)
METHEMOGLOBIN: 1 % (ref 0.0–1.5)
O2 Saturation: 67.5 %
TOTAL HEMOGLOBIN: 8.6 g/dL — AB (ref 12.0–16.0)

## 2017-11-15 LAB — AMMONIA: Ammonia: 54 umol/L — ABNORMAL HIGH (ref 9–35)

## 2017-11-15 MED ORDER — POTASSIUM CHLORIDE 10 MEQ/50ML IV SOLN
10.0000 meq | INTRAVENOUS | Status: AC
  Administered 2017-11-15 (×2): 10 meq via INTRAVENOUS
  Filled 2017-11-15 (×2): qty 50

## 2017-11-15 MED ORDER — PNEUMOCOCCAL VAC POLYVALENT 25 MCG/0.5ML IJ INJ: 0.5000 mL | INJECTION | INTRAMUSCULAR | Status: DC

## 2017-11-15 MED ORDER — FUROSEMIDE 10 MG/ML IJ SOLN
40.0000 mg | Freq: Two times a day (BID) | INTRAMUSCULAR | Status: AC
  Administered 2017-11-15 – 2017-11-17 (×4): 40 mg via INTRAVENOUS
  Filled 2017-11-15 (×4): qty 4

## 2017-11-15 NOTE — Progress Notes (Signed)
Patient pulling at sternal dressing, leads, lines, and blood pressure cuff. Redirected by sister. Telesitter initiated at 2330. Will continue to monitor.

## 2017-11-15 NOTE — Progress Notes (Signed)
3 Days Post-Op Procedure(s) (LRB): REDO STERNOTOMY (N/A) AORTIC VALVE REPLACEMENT (AVR)  USING 19 MM MAGNA EASE PERICARDIAL BIOPROSTHESIS-AORTIC, MODEL 3300TFX (N/A) MITRAL VALVE (MV) REPLACEMENT USING 25 MM MAGNA MITRAL EASE PERICARDIAL BIOPROSTHESIS, MODEL 7300TFX (N/A) TRANSESOPHAGEAL ECHOCARDIOGRAM (TEE) (N/A) ARTERIAL LINE INSERTION (Right) Subjective: Sleepy but responds Reportedly confused today but better.3  Objective: Vital signs in last 24 hours: Temp:  [96.8 F (36 C)-98.7 F (37.1 C)] 98.7 F (37.1 C) (12/08 1624) Pulse Rate:  [25-87] 85 (12/08 1300) Cardiac Rhythm: Atrial fibrillation (12/08 1600) Resp:  [16-29] 24 (12/08 1800) BP: (91-135)/(42-67) 113/46 (12/08 1800) SpO2:  [47 %-100 %] 100 % (12/08 1300) Arterial Line BP: (72-126)/(25-52) 121/45 (12/08 1800) Weight:  [84.2 kg (185 lb 10 oz)] 84.2 kg (185 lb 10 oz) (12/08 0600)  Hemodynamic parameters for last 24 hours:    Intake/Output from previous day: 12/07 0701 - 12/08 0700 In: 1675.2 [P.O.:420; I.V.:1105.2; IV Piggyback:150] Out: 2385 [Urine:2385] Intake/Output this shift: No intake/output data recorded.  Heart: regular rate and rhythm, S1, S2 normal, no murmur, click, rub or gallop Lungs: clear to auscultation bilaterally Abdomen: soft, non-tender; bowel sounds normal; no masses,  no organomegaly Extremities: edema moderate Wound: incision ok  Lab Results: Recent Labs    11/14/17 0308 11/15/17 0314  WBC 11.9* 7.9  HGB 8.9* 8.6*  HCT 28.5* 27.2*  PLT 56* 40*   BMET:  Recent Labs    11/14/17 0308 11/15/17 0314  NA 138 136  K 3.6 3.5  CL 107 107  CO2 23 22  GLUCOSE 214* 147*  BUN 23* 22*  CREATININE 1.09* 0.83  CALCIUM 8.1* 8.1*    PT/INR:  Recent Labs    11/14/17 0308  LABPROT 18.4*  INR 1.55   ABG    Component Value Date/Time   PHART 7.415 11/13/2017 0953   HCO3 22.8 11/13/2017 0953   TCO2 23 11/13/2017 1520   ACIDBASEDEF 1.0 11/13/2017 0953   O2SAT 67.5 11/15/2017 0339    CBG (last 3)  Recent Labs    11/15/17 0353 11/15/17 0803 11/15/17 1125  GLUCAP 146* 132* 172*    Assessment/Plan: S/P Procedure(s) (LRB): REDO STERNOTOMY (N/A) AORTIC VALVE REPLACEMENT (AVR)  USING 19 MM MAGNA EASE PERICARDIAL BIOPROSTHESIS-AORTIC, MODEL 3300TFX (N/A) MITRAL VALVE (MV) REPLACEMENT USING 25 MM MAGNA MITRAL EASE PERICARDIAL BIOPROSTHESIS, MODEL 7300TFX (N/A) TRANSESOPHAGEAL ECHOCARDIOGRAM (TEE) (N/A) ARTERIAL LINE INSERTION (Right)  She is hemodynamically stable in sinus rhythm on IV amio. Dopamine at 3 mcg. Will wean off tomorrow.  Cirrhosis with elevated ammonia and altered mental status. Ammonia down today after lactulose. Bowels working.  Atrial fibrillation: now sinus on amio.  Plan to start Coumadin tomorrow for two valves and atrial fib.  Volume excess: start diuresis.    LOS: 3 days    Gaye Pollack 11/15/2017

## 2017-11-16 LAB — BASIC METABOLIC PANEL
ANION GAP: 7 (ref 5–15)
BUN: 21 mg/dL — ABNORMAL HIGH (ref 6–20)
CALCIUM: 7.9 mg/dL — AB (ref 8.9–10.3)
CO2: 20 mmol/L — ABNORMAL LOW (ref 22–32)
CREATININE: 0.86 mg/dL (ref 0.44–1.00)
Chloride: 105 mmol/L (ref 101–111)
Glucose, Bld: 143 mg/dL — ABNORMAL HIGH (ref 65–99)
Potassium: 3.6 mmol/L (ref 3.5–5.1)
SODIUM: 132 mmol/L — AB (ref 135–145)

## 2017-11-16 LAB — CBC
HCT: 29.1 % — ABNORMAL LOW (ref 36.0–46.0)
HEMOGLOBIN: 9.3 g/dL — AB (ref 12.0–15.0)
MCH: 25.2 pg — AB (ref 26.0–34.0)
MCHC: 32 g/dL (ref 30.0–36.0)
MCV: 78.9 fL (ref 78.0–100.0)
PLATELETS: 44 10*3/uL — AB (ref 150–400)
RBC: 3.69 MIL/uL — AB (ref 3.87–5.11)
RDW: 22.2 % — AB (ref 11.5–15.5)
WBC: 9.4 10*3/uL (ref 4.0–10.5)

## 2017-11-16 LAB — TYPE AND SCREEN
ABO/RH(D): A POS
Antibody Screen: NEGATIVE
Unit division: 0
Unit division: 0
Unit division: 0
Unit division: 0

## 2017-11-16 LAB — BPAM RBC
Blood Product Expiration Date: 201812262359
Blood Product Expiration Date: 201812262359
Blood Product Expiration Date: 201812262359
Blood Product Expiration Date: 201812262359
ISSUE DATE / TIME: 201812050831
ISSUE DATE / TIME: 201812050831
ISSUE DATE / TIME: 201812050831
ISSUE DATE / TIME: 201812050831
Unit Type and Rh: 6200
Unit Type and Rh: 6200
Unit Type and Rh: 6200
Unit Type and Rh: 6200

## 2017-11-16 LAB — GLUCOSE, CAPILLARY
Glucose-Capillary: 143 mg/dL — ABNORMAL HIGH (ref 65–99)
Glucose-Capillary: 150 mg/dL — ABNORMAL HIGH (ref 65–99)
Glucose-Capillary: 153 mg/dL — ABNORMAL HIGH (ref 65–99)
Glucose-Capillary: 156 mg/dL — ABNORMAL HIGH (ref 65–99)
Glucose-Capillary: 165 mg/dL — ABNORMAL HIGH (ref 65–99)

## 2017-11-16 MED ORDER — PHENOL 1.4 % MT LIQD
1.0000 | OROMUCOSAL | Status: DC | PRN
  Administered 2017-11-16: 1 via OROMUCOSAL
  Filled 2017-11-16: qty 177

## 2017-11-16 MED ORDER — POTASSIUM CHLORIDE 10 MEQ/50ML IV SOLN
10.0000 meq | INTRAVENOUS | Status: AC | PRN
  Administered 2017-11-16 – 2017-11-17 (×3): 10 meq via INTRAVENOUS
  Filled 2017-11-16 (×4): qty 50

## 2017-11-16 MED ORDER — ASPIRIN 81 MG PO CHEW
81.0000 mg | CHEWABLE_TABLET | Freq: Every day | ORAL | Status: DC
  Administered 2017-11-17 – 2017-11-22 (×2): 81 mg
  Filled 2017-11-16 (×4): qty 1

## 2017-11-16 MED ORDER — WARFARIN - PHYSICIAN DOSING INPATIENT
Freq: Every day | Status: DC
  Administered 2017-11-16: 1
  Administered 2017-11-24: 18:00:00

## 2017-11-16 MED ORDER — AMIODARONE HCL 200 MG PO TABS
200.0000 mg | ORAL_TABLET | Freq: Two times a day (BID) | ORAL | Status: DC
  Administered 2017-11-16 – 2017-11-25 (×20): 200 mg via ORAL
  Filled 2017-11-16 (×21): qty 1

## 2017-11-16 MED ORDER — WARFARIN SODIUM 2.5 MG PO TABS
2.5000 mg | ORAL_TABLET | Freq: Every day | ORAL | Status: DC
  Administered 2017-11-16 – 2017-11-18 (×3): 2.5 mg via ORAL
  Filled 2017-11-16 (×3): qty 1

## 2017-11-16 MED ORDER — ASPIRIN EC 81 MG PO TBEC
81.0000 mg | DELAYED_RELEASE_TABLET | Freq: Every day | ORAL | Status: DC
  Administered 2017-11-18 – 2017-11-26 (×8): 81 mg via ORAL
  Filled 2017-11-16 (×11): qty 1

## 2017-11-16 MED ORDER — POTASSIUM CHLORIDE CRYS ER 20 MEQ PO TBCR
20.0000 meq | EXTENDED_RELEASE_TABLET | Freq: Three times a day (TID) | ORAL | Status: AC
  Administered 2017-11-16 (×2): 20 meq via ORAL
  Filled 2017-11-16 (×2): qty 1

## 2017-11-16 NOTE — Anesthesia Postprocedure Evaluation (Signed)
Anesthesia Post Note  Patient: Denise Cuevas  Procedure(s) Performed: REDO STERNOTOMY (N/A Chest) AORTIC VALVE REPLACEMENT (AVR)  USING 19 MM MAGNA EASE PERICARDIAL BIOPROSTHESIS-AORTIC, MODEL 3300TFX (N/A Chest) MITRAL VALVE (MV) REPLACEMENT USING 25 MM MAGNA MITRAL EASE PERICARDIAL BIOPROSTHESIS, MODEL 7300TFX (N/A Chest) TRANSESOPHAGEAL ECHOCARDIOGRAM (TEE) (N/A Chest) ARTERIAL LINE INSERTION (Right Groin)     Patient location during evaluation: ICU Anesthesia Type: General Level of consciousness: sedated Pain management: pain level controlled Vital Signs Assessment: post-procedure vital signs reviewed and stable Respiratory status: patient remains intubated per anesthesia plan Cardiovascular status: stable Postop Assessment: no apparent nausea or vomiting Anesthetic complications: no    Last Vitals:  Vitals:   11/16/17 0700 11/16/17 0812  BP: (!) 113/50   Pulse: 76   Resp: (!) 21   Temp:  36.8 C  SpO2: 100%     Last Pain:  Vitals:   11/16/17 0812  TempSrc: Oral  PainSc:                  Rayneisha Bouza

## 2017-11-16 NOTE — Progress Notes (Signed)
4 Days Post-Op Procedure(s) (LRB): REDO STERNOTOMY (N/A) AORTIC VALVE REPLACEMENT (AVR)  USING 19 MM MAGNA EASE PERICARDIAL BIOPROSTHESIS-AORTIC, MODEL 3300TFX (N/A) MITRAL VALVE (MV) REPLACEMENT USING 25 MM MAGNA MITRAL EASE PERICARDIAL BIOPROSTHESIS, MODEL 7300TFX (N/A) TRANSESOPHAGEAL ECHOCARDIOGRAM (TEE) (N/A) ARTERIAL LINE INSERTION (Right) Subjective:  Feels better. Hungry and wants to advance diet  Her sister is with her  Objective: Vital signs in last 24 hours: Temp:  [97.6 F (36.4 C)-98.7 F (37.1 C)] 98.2 F (36.8 C) (12/09 0812) Pulse Rate:  [63-107] 76 (12/09 0700) Cardiac Rhythm: Atrial fibrillation;Atrial flutter (12/09 0600) Resp:  [16-33] 21 (12/09 0700) BP: (91-115)/(43-76) 113/50 (12/09 0700) SpO2:  [99 %-100 %] 100 % (12/09 0700) Arterial Line BP: (72-128)/(25-52) 119/42 (12/09 0300) Weight:  [86.6 kg (190 lb 14.4 oz)] 86.6 kg (190 lb 14.4 oz) (12/09 0300)  Hemodynamic parameters for last 24 hours:    Intake/Output from previous day: 12/08 0701 - 12/09 0700 In: 1051.6 [I.V.:1001.6; IV Piggyback:50] Out: 3810 [Urine:1680] Intake/Output this shift: No intake/output data recorded.  General appearance: alert and cooperative Neurologic: intact Heart: regular rate and rhythm, S1, S2 normal, no murmur, click, rub or gallop Lungs: clear to auscultation bilaterally Abdomen: soft, non-tender; bowel sounds normal; no masses,  no organomegaly Extremities: edema moderate Wound: incisions ok  Lab Results: Recent Labs    11/15/17 0314 11/16/17 0310  WBC 7.9 9.4  HGB 8.6* 9.3*  HCT 27.2* 29.1*  PLT 40* 44*   BMET:  Recent Labs    11/15/17 0314 11/16/17 0310  NA 136 132*  K 3.5 3.6  CL 107 105  CO2 22 20*  GLUCOSE 147* 143*  BUN 22* 21*  CREATININE 0.83 0.86  CALCIUM 8.1* 7.9*    PT/INR:  Recent Labs    11/14/17 0308  LABPROT 18.4*  INR 1.55   ABG    Component Value Date/Time   PHART 7.415 11/13/2017 0953   HCO3 22.8 11/13/2017 0953   TCO2 23 11/13/2017 1520   ACIDBASEDEF 1.0 11/13/2017 0953   O2SAT 67.5 11/15/2017 0339   CBG (last 3)  Recent Labs    11/15/17 2031 11/15/17 2336 11/16/17 0808  GLUCAP 138* 153* 156*    Assessment/Plan: S/P Procedure(s) (LRB): REDO STERNOTOMY (N/A) AORTIC VALVE REPLACEMENT (AVR)  USING 19 MM MAGNA EASE PERICARDIAL BIOPROSTHESIS-AORTIC, MODEL 3300TFX (N/A) MITRAL VALVE (MV) REPLACEMENT USING 25 MM MAGNA MITRAL EASE PERICARDIAL BIOPROSTHESIS, MODEL 7300TFX (N/A) TRANSESOPHAGEAL ECHOCARDIOGRAM (TEE) (N/A) ARTERIAL LINE INSERTION (Right)  POD 4  She is hemodynamically stable on dop 3 in sinus rhythm. Will wean off.  Postop atrial fib: now sinus on amio IV. Will switch to po  Cirrhosis: ammonia level pending today but more alert  Expected postop blood loss anemia: improving. On iron  Volume excess: continue diuresis and KCL  Chronic thrombocytopenia: probably due to liver disease. Dropped to 40 K postop but starting to rise.  Will start Coumadin slowly with double valve replacement and atrial fib. Decrease ASA to 81 mg.  IS, OOB. PT consult for help with ambulation.     LOS: 4 days    Gaye Pollack 11/16/2017

## 2017-11-17 ENCOUNTER — Inpatient Hospital Stay (HOSPITAL_COMMUNITY): Payer: Medicare Other

## 2017-11-17 ENCOUNTER — Encounter (HOSPITAL_COMMUNITY): Payer: Self-pay | Admitting: General Practice

## 2017-11-17 ENCOUNTER — Other Ambulatory Visit: Payer: Self-pay

## 2017-11-17 DIAGNOSIS — I480 Paroxysmal atrial fibrillation: Secondary | ICD-10-CM

## 2017-11-17 LAB — CBC
HEMATOCRIT: 29.3 % — AB (ref 36.0–46.0)
HEMOGLOBIN: 9.3 g/dL — AB (ref 12.0–15.0)
MCH: 25.5 pg — ABNORMAL LOW (ref 26.0–34.0)
MCHC: 31.7 g/dL (ref 30.0–36.0)
MCV: 80.3 fL (ref 78.0–100.0)
PLATELETS: 38 10*3/uL — AB (ref 150–400)
RBC: 3.65 MIL/uL — AB (ref 3.87–5.11)
RDW: 23.1 % — ABNORMAL HIGH (ref 11.5–15.5)
WBC: 9.3 10*3/uL (ref 4.0–10.5)

## 2017-11-17 LAB — GLUCOSE, CAPILLARY
Glucose-Capillary: 181 mg/dL — ABNORMAL HIGH (ref 65–99)
Glucose-Capillary: 191 mg/dL — ABNORMAL HIGH (ref 65–99)
Glucose-Capillary: 133 mg/dL — ABNORMAL HIGH (ref 65–99)
Glucose-Capillary: 151 mg/dL — ABNORMAL HIGH (ref 65–99)
Glucose-Capillary: 189 mg/dL — ABNORMAL HIGH (ref 65–99)
Glucose-Capillary: 172 mg/dL — ABNORMAL HIGH (ref 65–99)
Glucose-Capillary: 215 mg/dL — ABNORMAL HIGH (ref 65–99)
Glucose-Capillary: 239 mg/dL — ABNORMAL HIGH (ref 65–99)

## 2017-11-17 LAB — COMPREHENSIVE METABOLIC PANEL
ALT: 46 U/L (ref 14–54)
ANION GAP: 8 (ref 5–15)
AST: 63 U/L — ABNORMAL HIGH (ref 15–41)
Albumin: 2.2 g/dL — ABNORMAL LOW (ref 3.5–5.0)
Alkaline Phosphatase: 104 U/L (ref 38–126)
BUN: 17 mg/dL (ref 6–20)
CHLORIDE: 105 mmol/L (ref 101–111)
CO2: 22 mmol/L (ref 22–32)
CREATININE: 0.93 mg/dL (ref 0.44–1.00)
Calcium: 7.8 mg/dL — ABNORMAL LOW (ref 8.9–10.3)
Glucose, Bld: 149 mg/dL — ABNORMAL HIGH (ref 65–99)
POTASSIUM: 3.4 mmol/L — AB (ref 3.5–5.1)
Sodium: 135 mmol/L (ref 135–145)
Total Bilirubin: 3.8 mg/dL — ABNORMAL HIGH (ref 0.3–1.2)
Total Protein: 4.4 g/dL — ABNORMAL LOW (ref 6.5–8.1)

## 2017-11-17 LAB — PROTIME-INR
INR: 1.45
Prothrombin Time: 17.6 seconds — ABNORMAL HIGH (ref 11.4–15.2)

## 2017-11-17 LAB — AMMONIA: Ammonia: 53 umol/L — ABNORMAL HIGH (ref 9–35)

## 2017-11-17 MED ORDER — SODIUM CHLORIDE 0.9% FLUSH
10.0000 mL | Freq: Two times a day (BID) | INTRAVENOUS | Status: DC
  Administered 2017-11-17 – 2017-11-22 (×10): 10 mL
  Administered 2017-11-23: 20 mL
  Administered 2017-11-24 – 2017-11-25 (×2): 10 mL

## 2017-11-17 MED ORDER — LACTULOSE 10 GM/15ML PO SOLN
20.0000 g | Freq: Every day | ORAL | Status: DC
  Administered 2017-11-17: 20 g via ORAL
  Filled 2017-11-17: qty 30

## 2017-11-17 MED ORDER — CHLORHEXIDINE GLUCONATE CLOTH 2 % EX PADS
6.0000 | MEDICATED_PAD | Freq: Every day | CUTANEOUS | Status: DC
Start: 2017-11-17 — End: 2017-11-24
  Administered 2017-11-17 – 2017-11-22 (×6): 6 via TOPICAL

## 2017-11-17 MED ORDER — SODIUM CHLORIDE 0.9% FLUSH: 10.0000 mL | INTRAVENOUS | Status: DC | PRN

## 2017-11-17 MED ORDER — PATIENT'S GUIDE TO USING COUMADIN BOOK
Freq: Once | Status: DC
  Filled 2017-11-17: qty 1

## 2017-11-17 MED ORDER — GERHARDT'S BUTT CREAM
TOPICAL_CREAM | CUTANEOUS | Status: DC | PRN
  Administered 2017-11-17 – 2017-11-19 (×3): 1 via TOPICAL
  Filled 2017-11-17: qty 1

## 2017-11-17 NOTE — Evaluation (Signed)
Physical Therapy Evaluation Patient Details Name: Denise Cuevas MRN: [id number] DOB: [birth year] Today's Date: 11/17/2017   History of Present Illness  Pt adm on 12/5 for AVR and MVR. PMH - CABG, chf, idiopathic cirrhosis, htn, dm  Clinical Impression  Pt admitted with above diagnosis and presents to PT with functional limitations due to deficits listed below (See PT problem list). Pt needs skilled PT to maximize independence and safety to allow discharge to home with 24 hour supervision. Expect progress to be steady and pt appears to have supportive family.     Follow Up Recommendations Home health PT;Supervision/Assistance - 24 hour    Equipment Recommendations  None recommended by PT    Recommendations for Other Services       Precautions / Restrictions Precautions Precautions: Sternal;Fall Restrictions Other Position/Activity Restrictions: sternal precautions      Mobility  Bed Mobility               General bed mobility comments: Pt up in chair  Transfers Overall transfer level: Needs assistance Equipment used: Ambulation equipment used(EVA walker) Transfers: Sit to/from Stand;Stand Pivot Transfers Sit to Stand: +2 physical assistance;Min assist Stand pivot transfers: +2 physical assistance;Min assist       General transfer comment: Assist to bring hips up and for balance. Bed to bsc with stand pivot with HHA  Ambulation/Gait Ambulation/Gait assistance: Min assist;+2 safety/equipment Ambulation Distance (Feet): 80 Feet Assistive device: (EVA walker) Gait Pattern/deviations: Step-through pattern;Decreased step length - right;Decreased step length - left;Shuffle;Trunk flexed Gait velocity: decr Gait velocity interpretation: Below normal speed for age/gender General Gait Details: Assist for balance and support. Verbal cues to stand more erect. VSS  Stairs            Wheelchair Mobility    Modified Rankin (Stroke Patients Only)        Balance Overall balance assessment: Needs assistance Sitting-balance support: Bilateral upper extremity supported;Feet supported Sitting balance-Leahy Scale: Poor Sitting balance - Comments: UE support   Standing balance support: Bilateral upper extremity supported Standing balance-Leahy Scale: Poor Standing balance comment: UE support and min assist                             Pertinent Vitals/Pain Pain Assessment: Faces Faces Pain Scale: Hurts a little bit Pain Location: incisional Pain Descriptors / Indicators: Guarding Pain Intervention(s): Limited activity within patient's tolerance;Monitored during session    Home Living Family/patient expects to be discharged to:: Private residence Living Arrangements: Spouse/significant other Available Help at Discharge: Family;Available 24 hours/day Type of Home: House Home Access: Stairs to enter Entrance Stairs-Rails: Right Entrance Stairs-Number of Steps: 7 Home Layout: Multi-level;Able to live on main level with bedroom/bathroom Home Equipment: Gilford Rile - 2 wheels      Prior Function Level of Independence: Independent         Comments: Retired Pharmacist, hospital. Takes care of 2 grandchildren      Hand Dominance        Extremity/Trunk Assessment   Upper Extremity Assessment Upper Extremity Assessment: Defer to OT evaluation    Lower Extremity Assessment Lower Extremity Assessment: Generalized weakness       Communication   Communication: No difficulties  Cognition Arousal/Alertness: Awake/alert Behavior During Therapy: WFL for tasks assessed/performed Overall Cognitive Status: Impaired/Different from baseline Area of Impairment: Problem solving  Problem Solving: Slow processing        General Comments      Exercises     Assessment/Plan    PT Assessment Patient needs continued PT services  PT Problem List Decreased strength;Decreased activity  tolerance;Decreased balance;Decreased mobility;Decreased cognition;Decreased knowledge of precautions       PT Treatment Interventions DME instruction;Gait training;Stair training;Functional mobility training;Therapeutic activities;Therapeutic exercise;Balance training;Patient/family education    PT Goals (Current goals can be found in the Care Plan section)  Acute Rehab PT Goals Patient Stated Goal: return home PT Goal Formulation: With patient Time For Goal Achievement: 12/01/17 Potential to Achieve Goals: Good    Frequency Min 3X/week   Barriers to discharge Inaccessible home environment stairs to enter    Co-evaluation               AM-PAC PT "6 Clicks" Daily Activity  Outcome Measure Difficulty turning over in bed (including adjusting bedclothes, sheets and blankets)?: Unable Difficulty moving from lying on back to sitting on the side of the bed? : Unable Difficulty sitting down on and standing up from a chair with arms (e.g., wheelchair, bedside commode, etc,.)?: Unable Help needed moving to and from a bed to chair (including a wheelchair)?: A Little Help needed walking in hospital room?: A Little Help needed climbing 3-5 steps with a railing? : Total 6 Click Score: 10    End of Session Equipment Utilized During Treatment: Gait belt;Oxygen Activity Tolerance: Patient limited by fatigue Patient left: Other (comment);with call bell/phone within reach;with family/visitor present(on bsc) Nurse Communication: Mobility status;Other (comment)(Pt on bsc with family member present) PT Visit Diagnosis: Unsteadiness on feet (R26.81);Muscle weakness (generalized) (M62.81)    Time: 5852-7782 PT Time Calculation (min) (ACUTE ONLY): 36 min   Charges:   PT Evaluation $PT Eval Moderate Complexity: 1 Mod PT Treatments $Gait Training: 8-22 mins   PT G Codes:        [facility] PT [place] 11/17/2017, 2:38 PM

## 2017-11-17 NOTE — Consult Note (Signed)
Cardiology Consultation:   Patient ID: Denise Cuevas; [id number]; 09-23-1951   Admit date: 11/12/2017 Date of Consult: 11/17/2017  Primary Care Provider: [name], MD Primary Cardiologist: Dr. [name]    Patient Profile:   Denise Cuevas is a 65 y.o. female with a hx of CAD (CABG 2012, LIMA to LAD, SVG to OM with subsequent DES to distal anastomosis SVG OM and distal LAD), HTN, HLD, DM, GERD, hypothyroid, cirrhosis, and most recently VHD now s/p AVR/MVR both bioprosthetic, POD # 5 today who is being seen today for the evaluation of AFib at the request of Dr. Servando Snare.  History of Present Illness:   Denise Cuevas is POD #5 with both AS/MS underwent AVR/MVR both bioprosthetic developed p/o AFib started on IV amiodarone, her dopamine was weaned off yesterday and IV to PO amiodarone yesterday as well.  Denise Cuevas is on ASA and warfarin started.  LABS: K+ 3.4 (being replaced) BUN/Creat 17/0.93 AST 63 (trending down) ALT 46 (trending down) Albumin 2.2 WBC 9.3 H/H 9.3/29.3 plts 38 INR 1.45  The patient has chest wall pain with cough, no overt SOB at rest, no sensation of palpitations.  Past Medical History:  Diagnosis Date  . Angina   . Asthma   . CHF (congestive heart failure) ([place])   . Cirrhosis of liver ([place]) 08/2014   idiopathic  . COPD (chronic obstructive pulmonary disease) ([place])   . Coronary artery disease    s/p CABG  . Depression   . Diabetes mellitus    type 2  . Dyspnea   . Edema extremities    consistent edema lower legs, feet, and right quadrant abdominal pain-"goes and comes"  . Family history of adverse reaction to anesthesia    SISTER HAS NAUSEA  . Fibromyalgia   . GERD (gastroesophageal reflux disease)   . H/O hiatal hernia   . Headache(784.0)    occ. generalized  . Heart murmur   . Hypercholesteremia   . Hypertension   . Hypothyroid   . Myocardial infarction [facility]) 11/2011   MI x2- 3'29,5'18 coronary stent(chest pain episode at time)  . PONV  (postoperative nausea and vomiting)     Past Surgical History:  Procedure Laterality Date  . AORTIC VALVE REPLACEMENT N/A 11/12/2017   Procedure: AORTIC VALVE REPLACEMENT (AVR)  USING 19 MM MAGNA EASE PERICARDIAL BIOPROSTHESIS-AORTIC, MODEL 3300TFX;  Surgeon: [name], MD;  Location: [place];  Service: Open Heart Surgery;  Laterality: N/A;  . ARTERIAL LINE INSERTION Right 11/12/2017   Procedure: ARTERIAL LINE INSERTION;  Surgeon: [name], MD;  Location: [place];  Service: Open Heart Surgery;  Laterality: Right;  placed in right femoral artery.  . CHOLECYSTECTOMY     open many yrs ago  . COLONOSCOPY WITH PROPOFOL N/A 08/24/2014   Procedure: COLONOSCOPY WITH PROPOFOL;  Surgeon: Arta Silence, MD;  Location: [facility] ENDOSCOPY;  Service: Endoscopy;  Laterality: N/A;  . CORONARY ANGIOPLASTY WITH STENT PLACEMENT  05/06/2012  . CORONARY ARTERY BYPASS GRAFT  11/27/2011   Procedure: CORONARY ARTERY BYPASS GRAFTING (CABG);  Surgeon: [name], MD;  Location: [place];  Service: Open Heart Surgery;  Laterality: N/A;  coronary artery bypass graft on pump times two utilizing left internal mammary artery and right saphenous vein harvested endoscopically   . ESOPHAGOGASTRODUODENOSCOPY (EGD) WITH PROPOFOL N/A 08/24/2014   Procedure: ESOPHAGOGASTRODUODENOSCOPY (EGD) WITH PROPOFOL;  Surgeon: Arta Silence, MD;  Location: [facility];  Service: Endoscopy;  Laterality: N/A;  . KNEE SURGERY Right    scope surgery  .  LEFT HEART CATHETERIZATION WITH CORONARY ANGIOGRAM N/A 11/19/2011   Procedure: LEFT HEART CATHETERIZATION WITH CORONARY ANGIOGRAM;  Surgeon: Candee Furbish, MD;  Location: [facility] CATH LAB;  Service: Cardiovascular;  Laterality: N/A;  . LEFT HEART CATHETERIZATION WITH CORONARY ANGIOGRAM  05/06/2012   Procedure: LEFT HEART CATHETERIZATION WITH CORONARY ANGIOGRAM;  Surgeon: Jettie Booze, MD;  Location: [facility] CATH LAB;  Service: Cardiovascular;;  . LEFT HEART CATHETERIZATION WITH CORONARY/GRAFT  ANGIOGRAM N/A 03/24/2012   Procedure: LEFT HEART CATHETERIZATION WITH Beatrix Fetters;  Surgeon: Jettie Booze, MD;  Location: [facility] CATH LAB;  Service: Cardiovascular;  Laterality: N/A;  . MITRAL VALVE REPLACEMENT N/A 11/12/2017   Procedure: MITRAL VALVE (MV) REPLACEMENT USING 25 MM MAGNA MITRAL EASE PERICARDIAL BIOPROSTHESIS, MODEL 7300TFX;  Surgeon: [name], MD;  Location: [place];  Service: Open Heart Surgery;  Laterality: N/A;  Using 4mm Magna Ease Mitral Pericardial Bioprosthesis  . PERCUTANEOUS CORONARY STENT INTERVENTION (PCI-S)  03/24/2012   Procedure: PERCUTANEOUS CORONARY STENT INTERVENTION (PCI-S);  Surgeon: Jettie Booze, MD;  Location: [facility] CATH LAB;  Service: Cardiovascular;;  . PERCUTANEOUS CORONARY STENT INTERVENTION (PCI-S) Bilateral 05/06/2012   Procedure: PERCUTANEOUS CORONARY STENT INTERVENTION (PCI-S);  Surgeon: Jettie Booze, MD;  Location: [facility] CATH LAB;  Service: Cardiovascular;  Laterality: Bilateral;  . RIGHT/LEFT HEART CATH AND CORONARY/GRAFT ANGIOGRAPHY N/A 10/20/2017   Procedure: RIGHT/LEFT HEART CATH AND CORONARY/GRAFT ANGIOGRAPHY;  Surgeon: Burnell Blanks, MD;  Location: [place] CV LAB;  Service: Cardiovascular;  Laterality: N/A;  . TEE WITHOUT CARDIOVERSION N/A 10/20/2017   Procedure: TRANSESOPHAGEAL ECHOCARDIOGRAM (TEE);  Surgeon: Dorothy Spark, MD;  Location: [facility] ENDOSCOPY;  Service: Cardiovascular;  Laterality: N/A;  . TEE WITHOUT CARDIOVERSION N/A 11/12/2017   Procedure: TRANSESOPHAGEAL ECHOCARDIOGRAM (TEE);  Surgeon: [name], MD;  Location: [place];  Service: Open Heart Surgery;  Laterality: N/A;       Inpatient Medications: Scheduled Meds: . amiodarone  200 mg Oral BID  . aspirin EC  81 mg Oral Daily   Or  . aspirin  81 mg Per Tube Daily  . atorvastatin  40 mg Oral q1800  . Chlorhexidine Gluconate Cloth  6 each Topical Daily  . DULoxetine  90 mg Oral Q lunch  . ferrous sulfate  325 mg Oral Q breakfast   . insulin aspart  0-24 Units Subcutaneous Q4H  . lactulose  20 g Oral Daily  . levothyroxine  50 mcg Oral QAC breakfast  . mouth rinse  15 mL Mouth Rinse BID  . pantoprazole  40 mg Oral Daily  . patient's guide to using coumadin book   Does not apply Once  . pneumococcal 23 valent vaccine  0.5 mL Intramuscular Tomorrow-1000  . sodium chloride flush  10-40 mL Intracatheter Q12H  . sodium chloride flush  3 mL Intravenous Q12H  . warfarin  2.5 mg Oral q1800  . Warfarin - Physician Dosing Inpatient   Does not apply q1800   Continuous Infusions: . sodium chloride Stopped (11/13/17 1900)  . sodium chloride    . sodium chloride Stopped (11/13/17 0700)  . lactated ringers 20 mL/hr at 11/15/17 0338  . lactated ringers 20 mL/hr at 11/17/17 1000   PRN Meds: sodium chloride, mometasone-formoterol, morphine injection, ondansetron (ZOFRAN) IV, oxyCODONE, phenol, sodium chloride flush, sodium chloride flush, traMADol  Allergies:    Allergies  Allergen Reactions  . Exenatide Nausea And Vomiting  . Ace Inhibitors Other (See Comments)    pseudoasthma    Social History:   Social History  Socioeconomic History  . Marital status: Married    Spouse name: Not on file  . Number of children: Not on file  . Years of education: Not on file  . Highest education level: Not on file  Social Needs  . Financial resource strain: Not on file  . Food insecurity - worry: Not on file  . Food insecurity - inability: Not on file  . Transportation needs - medical: Not on file  . Transportation needs - non-medical: Not on file  Occupational History  . Occupation: Retired Doctor, hospital  Tobacco Use  . Smoking status: Never Smoker  . Smokeless tobacco: Never Used  Substance and Sexual Activity  . Alcohol use: No  . Drug use: No  . Sexual activity: Yes  Other Topics Concern  . Not on file  Social History Narrative   Retired Naval architect          Family History:    Family History  Problem  Relation Age of Onset  . Heart disease Father 25       died of MI  . Emphysema Father        smoked  . Peripheral vascular disease Mother 3       bilaterial amputations     ROS:  Please see the history of present illness.  ROS All other ROS reviewed and negative.     Physical Exam/Data:   Vitals:   11/17/17 0800 11/17/17 0813 11/17/17 0900 11/17/17 1000  BP: (!) 109/96 (!) 109/96  124/70  Pulse: (!) 105 (!) 108 60 (!) 102  Resp: (!) 23 (!) 25 (!) 23 15  Temp: 97.7 F (36.5 C) 97.7 F (36.5 C)    TempSrc: Oral Oral    SpO2:   99% 95%  Weight:      Height:        Intake/Output Summary (Last 24 hours) at 11/17/2017 1124 Last data filed at 11/17/2017 1000 Gross per 24 hour  Intake 1480 ml  Output 2050 ml  Net -570 ml   Filed Weights   11/14/17 0500 11/15/17 0600 11/16/17 0300  Weight: 185 lb 10 oz (84.2 kg) 185 lb 10 oz (84.2 kg) 190 lb 14.4 oz (86.6 kg)   Body mass index is 34.92 kg/m.  General:  Well nourished, well developed, in no acute distress HEENT: normal Lymph: no adenopathy Neck: 7 cm JVD Endocrine:  No thryomegaly Vascular: No carotid bruits Cardiac:  IRRR, no murmurs, no gallops or rubs are appreciated Lungs:  Soft crackles b/l, no wheezing, or rales  Abd: soft, nontender Ext: no edema Musculoskeletal:  No deformities, BUE and BLE strength normal and equal Skin: warm and dry, thoracotomy site is dry, + jaundice Neuro:  No gross focal abnormalities noted Psych:  Normal affect   EKG:  The EKG was personally reviewed and demonstrates:   #1 A pace V sensed #2 SR, 74bpm, PR 117ms, QRS 35ms, Qtc 442ms #3 AFib 147bpm, V1-2 off, QRS 50ms Telemetry:  Telemetry was personally reviewed and demonstrates:   AFib 90's-110, SR rate generally 70s  Relevant CV Studies:  10/16/17: TTE Study Conclusions - Left ventricle: The cavity size was normal. Systolic function was   normal. The estimated ejection fraction was in the range of 60%   to 65%. Wall motion  was normal; there were no regional wall   motion abnormalities. Mitral stenosis limits evaluation of LV   diastolic function. Doppler parameters are consistent with   elevated mean left atrial filling pressure. -  Aortic valve: Valve mobility was restricted. There was moderate   stenosis. Valve area (VTI): 1.05 cm^2. - Mitral valve: Calcified annulus. Mild diffuse thickening,   consistent with rheumatic disease. Mobility was restricted.   Transvalvular velocity was increased. The findings are consistent   with mild to moderate stenosis. There was moderate regurgitation   directed centrally. Mean gradient (D): 15 mm Hg. Gradients   recorded at 88 bpm. Planimetered valve area: 1.53 cm^2. Valve   area by pressure half-time: 1.49 cm^2. Valve area by continuity   equation (using LVOT flow): 1.26 cm^2. - Left atrium: The atrium was mildly dilated.  10/20/17; TEE Impressions: - Aortic valve is severely calcified predominantly in the   non-coronary leaflet, leaflet opening is moderately decreased but   still opening. Peak/mean transaortic gradients were 64/33 mmHg.   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 There is at most   moderate mitral regurgitation but moderate to severe mitral   stenosis with mean transmitral gradient 10 mmHg.   10/20/17: R/LHC  Prox RCA to Mid RCA lesion is 20% stenosed.  Previously placed Ost Cx to Prox Cx stent (unknown type) is widely patent.  Previously placed 2nd Mrg stent (unknown type) is widely patent.  Ost 2nd Mrg lesion is 50% stenosed.  Prox LAD lesion is 100% stenosed.  SVG graft was not visualized.  LIMA graft was visualized by angiography and is normal in caliber.  The graft exhibits no disease.  Previously placed Mid LAD to Dist LAD stent (unknown type) is widely patent.  Ost LAD to Prox LAD lesion is 20% stenosed.  There is moderate aortic valve stenosis.  There  is moderate mitral valve stenosis.  Hemodynamic findings consistent with moderate pulmonary hypertension.  LV end diastolic pressure is normal. 1. Double vessel CAD s/p 2V CABG with 1/2 patent bypass grafts 2. The LAD courses to the apex. The mid LAD is occluded after a moderate caliber diagonal branch. The mid and distal LAD fills from the patent LIMA graft. The stent in the distal LAD is patent without restenosis.  3. The Circumflex artery is patent. There are patent stents in the proximal Circumflex and distal segment of the obtuse marginal branch. The proximal segment of the OM branch prior to the stent has a moderate non-obstructive stenosis. The vein graft to the OM branch is known to be occluded and was not selectively engaged today.  4. The RCA is a small to moderate caliber co-dominant vessel with mild non-obstructive plaque.  5. Moderate Aortic stenosis (peak to peak gradient 23 mmHg, mean gradient 20 mmHg, AVA 2.2 cm2) 6. Moderately severe mitral stenosis (peak to peak gradient 14 mmHg, mean gradient 12.5 mmHg, MVA 3.32cm2) Recommendations: Medical management of CAD. Close follow up of her valve disease. Consider surgical consultation.    Laboratory Data:  Chemistry Recent Labs  Lab 11/15/17 0314 11/16/17 0310 11/17/17 0427  NA 136 132* 135  K 3.5 3.6 3.4*  CL 107 105 105  CO2 22 20* 22  GLUCOSE 147* 143* 149*  BUN 22* 21* 17  CREATININE 0.83 0.86 0.93  CALCIUM 8.1* 7.9* 7.8*  GFRNONAA >60 >60 >60  GFRAA >60 >60 >60  ANIONGAP 7 7 8     Recent Labs  Lab 11/10/17 1437 11/13/17 0718 11/14/17 0308 11/17/17 0427  PROT 5.2*  --  4.3* 4.4*  ALBUMIN 2.5*  --  2.8* 2.2*  AST 93* 149* 186* 63*  ALT 44 41  75* 46  ALKPHOS 135*  --  86 104  BILITOT 2.1*  --  3.7* 3.8*   Hematology Recent Labs  Lab 11/15/17 0314 11/16/17 0310 11/17/17 0427  WBC 7.9 9.4 9.3  RBC 3.45* 3.69* 3.65*  HGB 8.6* 9.3* 9.3*  HCT 27.2* 29.1* 29.3*  MCV 78.8 78.9 80.3  MCH 24.9* 25.2*  25.5*  MCHC 31.6 32.0 31.7  RDW 21.5* 22.2* 23.1*  PLT 40* 44* 38*   Cardiac EnzymesNo results for input(s): TROPONINI in the last 168 hours. No results for input(s): TROPIPOC in the last 168 hours.  BNPNo results for input(s): BNP, PROBNP in the last 168 hours.  DDimer No results for input(s): DDIMER in the last 168 hours.  Radiology/Studies:  Dg Chest Port 1 View Result Date: 11/17/2017 CLINICAL DATA:  Cardiac valve replacements. EXAM: PORTABLE CHEST 1 VIEW COMPARISON:  11/15/2017 FINDINGS: Evidence for aortic and mitral valve replacements. Right jugular central line is present. Low lung volumes with persistent densities at the right lung base and elevation of the right hemidiaphragm. Slightly improved aeration at both lung bases. Upper lungs are clear without pulmonary edema. Heart size remains enlarged. Median sternotomy wires are present. IMPRESSION: Slightly improved aeration at both lung bases. Persistent densities at the right lung base with volume loss. Electronically Signed   By: Markus Daft M.D.   On: 11/17/2017 08:17     Assessment and Plan:   1.  P/o Afib      CHA2DS2Vasc is 4, warfarin started, on ASA      Transition from IV to PO amiodarone yesterday 200mg  BID  Would continue amiodarone short term, with hepatic/cirrhosis history, not likely good drug for long term if AF becomes a more persistent problem.  Liver enzymes are trending downward, ammonia level is also starting downward Rates generally OK, resume BB if BP allows    2. VHD, s/p AVR/MVR (both bioprosthetic)     POD #5  3. CAD     Pre-op cath noted above, stable disease     No anginal complaints     Post-op/chest wall pain only       Dr. Lovena Le will see later today   For questions or updates, please contact [facility] Please consult [email or web address] for contact info under Cardiology/STEMI.   Signed, Baldwin Jamaica, PA-C  11/17/2017 11:24 AM   EP attending  Patient seen and examined.  Agree with  the findings as noted above.  The patient is a 66 year old woman with a history of multiple medical problems including coronary artery disease status post prior bypass surgery.  Denise Cuevas carries a diagnosis of cirrhosis and hepatitis which Denise Cuevas thinks is due to medications.  Denise Cuevas underwent aortic and mitral valve replacement and developed postoperative atrial fibrillation and we are asked to see for additional evaluation.  Initially her ventricular rates were elevated.  The patient does not feel palpitations.  Denise Cuevas has been placed on medical therapy with amiodarone and continues on warfarin.  Denise Cuevas denies chest pain or shortness of breath while in bed.  Her exam is notable for a pleasant but fairly frail-appearing 66 year old woman in no acute distress.  The lungs reveal rales in the bases bilaterally and the cardiovascular exam revealed an irregular regular rhythm.  Extremities demonstrate 2+ soft pitting edema bilaterally.  Neurological exam is nonfocal.  Her abdominal exam is unremarkable.  There is no rebound or guarding present.  Telemetry demonstrates atrial fibrillation with a controlled ventricular response in the 90 bpm range.  With regard  to treatment options, long-term amiodarone is not a great option.  I would suggest continuing this medication for approximately 4 weeks and then stopping it.  Her warfarin levels will have to be followed carefully.  Denise Cuevas is not a great candidate for long-term amiodarone.  Hopefully Denise Cuevas will revert to sinus rhythm spontaneously, but could be cardioverted if needed after Denise Cuevas has been therapeutically anticoagulated.  Because her rates appear to be relatively well controlled and because Denise Cuevas overall appears to be doing well, I would not recommend urgent cardioversion at this time.  Crissie Sickles, MD

## 2017-11-17 NOTE — Progress Notes (Signed)
Peripherally Inserted Central Catheter/Midline Placement  The IV Nurse has discussed with the patient and/or persons authorized to consent for the patient, the purpose of this procedure and the potential benefits and risks involved with this procedure.  The benefits include less needle sticks, lab draws from the catheter, and the patient may be discharged home with the catheter. Risks include, but not limited to, infection, bleeding, blood clot (thrombus formation), and puncture of an artery; nerve damage and irregular heartbeat and possibility to perform a PICC exchange if needed/ordered by physician.  Alternatives to this procedure were also discussed.  Bard Power PICC patient education guide, fact sheet on infection prevention and patient information card has been provided to patient /or left at bedside.    PICC/Midline Placement Documentation  PICC Double Lumen 54/27/06 PICC Right Basilic 38 cm 0 cm (Active)  Indication for Insertion or Continuance of Line Prolonged intravenous therapies 11/17/2017 11:00 AM  Exposed Catheter (cm) 0 cm 11/17/2017 11:00 AM  Site Assessment Clean;Dry;Intact 11/17/2017 11:00 AM  Lumen #1 Status Flushed;Blood return noted 11/17/2017 11:00 AM  Lumen #2 Status Flushed;Blood return noted 11/17/2017 11:00 AM  Dressing Type Transparent 11/17/2017 11:00 AM  Dressing Status Clean;Dry;Intact;Antimicrobial disc in place 11/17/2017 11:00 AM  Dressing Intervention New dressing 11/17/2017 11:00 AM  Dressing Change Due 11/24/17 11/17/2017 11:00 AM       Denise Cuevas 11/17/2017, 11:51 AM

## 2017-11-17 NOTE — Progress Notes (Signed)
Notified Dr. Servando Snare in [place] that midline catheter has to be removed for PICC line to be place.

## 2017-11-17 NOTE — Progress Notes (Signed)
Patient ID: Denise Cuevas, female   DOB: Aug 14, 1951, 66 y.o.   MRN: [id number] TCTS DAILY ICU PROGRESS NOTE                   [address]            [place] [address]          [phone]   5 Days Post-Op Procedure(s) (LRB): REDO STERNOTOMY (N/A) AORTIC VALVE REPLACEMENT (AVR)  USING 19 MM MAGNA EASE PERICARDIAL BIOPROSTHESIS-AORTIC, MODEL 3300TFX (N/A) MITRAL VALVE (MV) REPLACEMENT USING 25 MM MAGNA MITRAL EASE PERICARDIAL BIOPROSTHESIS, MODEL 7300TFX (N/A) TRANSESOPHAGEAL ECHOCARDIOGRAM (TEE) (N/A) ARTERIAL LINE INSERTION (Right)  Total Length of Stay:  LOS: 5 days   Subjective: Patient awake alert sitting in chair.  Objective: Vital signs in last 24 hours: Temp:  [98 F (36.7 C)-98.4 F (36.9 C)] 98.4 F (36.9 C) (12/10 0400) Pulse Rate:  [25-85] 44 (12/10 0600) Cardiac Rhythm: Atrial fibrillation (12/10 0000) Resp:  [18-38] 24 (12/10 0600) BP: (99-124)/(41-80) 115/54 (12/10 0600) SpO2:  [100 %] 100 % (12/10 0600)  Filed Weights   11/14/17 0500 11/15/17 0600 11/16/17 0300  Weight: 185 lb 10 oz (84.2 kg) 185 lb 10 oz (84.2 kg) 190 lb 14.4 oz (86.6 kg)    Weight change:    Hemodynamic parameters for last 24 hours:    Intake/Output from previous day: 12/09 0701 - 12/10 0700 In: 1093.6 [P.O.:240; I.V.:803.6; IV Piggyback:50] Out: 7902 [Urine:1650]  Intake/Output this shift: No intake/output data recorded.  Current Meds: Scheduled Meds: . amiodarone  200 mg Oral BID  . aspirin EC  81 mg Oral Daily   Or  . aspirin  81 mg Per Tube Daily  . atorvastatin  40 mg Oral q1800  . Chlorhexidine Gluconate Cloth  6 each Topical Daily  . DULoxetine  90 mg Oral Q lunch  . ferrous sulfate  325 mg Oral Q breakfast  . furosemide  40 mg Intravenous BID  . insulin aspart  0-24 Units Subcutaneous Q4H  . levothyroxine  50 mcg Oral QAC breakfast  . mouth rinse  15 mL Mouth Rinse BID  . pantoprazole  40 mg Oral Daily  . pneumococcal 23 valent vaccine  0.5  mL Intramuscular Tomorrow-1000  . potassium chloride  20 mEq Oral TID  . sodium chloride flush  10-40 mL Intracatheter Q12H  . sodium chloride flush  3 mL Intravenous Q12H  . warfarin  2.5 mg Oral q1800  . Warfarin - Physician Dosing Inpatient   Does not apply q1800   Continuous Infusions: . sodium chloride Stopped (11/13/17 1900)  . sodium chloride    . sodium chloride Stopped (11/13/17 0700)  . insulin (NOVOLIN-R) infusion Stopped (11/14/17 0800)  . lactated ringers 20 mL/hr at 11/15/17 0338  . lactated ringers 20 mL/hr at 11/17/17 0700  . potassium chloride Stopped (11/17/17 0557)  . tranexamic acid (CYKLOKAPRON) infusion (TRAUMA)     PRN Meds:.sodium chloride, mometasone-formoterol, morphine injection, ondansetron (ZOFRAN) IV, oxyCODONE, phenol, potassium chloride, sodium chloride flush, sodium chloride flush, traMADol  General appearance: alert, cooperative and no distress Neurologic: intact Heart: irregularly irregular rhythm Lungs: diminished breath sounds bibasilar Abdomen: soft, non-tender; bowel sounds normal; no masses,  no organomegaly Extremities: extremities normal, atraumatic, no cyanosis or edema and Homans sign is negative, no sign of DVT Wound: Incision intact sternum stable  Lab Results: CBC: Recent Labs    11/15/17 0314 11/16/17 0310  WBC 7.9 9.4  HGB 8.6* 9.3*  HCT  27.2* 29.1*  PLT 40* 44*   BMET:  Recent Labs    11/16/17 0310 11/17/17 0427  NA 132* 135  K 3.6 3.4*  CL 105 105  CO2 20* 22  GLUCOSE 143* 149*  BUN 21* 17  CREATININE 0.86 0.93  CALCIUM 7.9* 7.8*    CMET: Lab Results  Component Value Date   WBC 9.4 11/16/2017   HGB 9.3 (L) 11/16/2017   HCT 29.1 (L) 11/16/2017   PLT 44 (L) 11/16/2017   GLUCOSE 149 (H) 11/17/2017   CHOL 121 10/18/2017   TRIG 61 10/18/2017   HDL 33 (L) 10/18/2017   LDLCALC 76 10/18/2017   ALT 46 11/17/2017   AST 63 (H) 11/17/2017   NA 135 11/17/2017   K 3.4 (L) 11/17/2017   CL 105 11/17/2017    CREATININE 0.93 11/17/2017   BUN 17 11/17/2017   CO2 22 11/17/2017   TSH 2.664 10/15/2017   INR 1.45 11/17/2017   HGBA1C 7.5 (H) 11/10/2017      PT/INR:  Recent Labs    11/17/17 0427  LABPROT 17.6*  INR 1.45   Radiology: No results found.   Assessment/Plan: S/P Procedure(s) (LRB): REDO STERNOTOMY (N/A) AORTIC VALVE REPLACEMENT (AVR)  USING 19 MM MAGNA EASE PERICARDIAL BIOPROSTHESIS-AORTIC, MODEL 3300TFX (N/A) MITRAL VALVE (MV) REPLACEMENT USING 25 MM MAGNA MITRAL EASE PERICARDIAL BIOPROSTHESIS, MODEL 7300TFX (N/A) TRANSESOPHAGEAL ECHOCARDIOGRAM (TEE) (N/A) ARTERIAL LINE INSERTION (Right) Mobilize Diuresis DC Foley, place PICC line and DC right central line Physical therapy consult Has been started on Coumadin,  Will check H IT since platelets still low, avoiding heparin    Denise Cuevas 11/17/2017 7:32 AM

## 2017-11-18 ENCOUNTER — Encounter (HOSPITAL_COMMUNITY): Payer: Self-pay | Admitting: Cardiothoracic Surgery

## 2017-11-18 ENCOUNTER — Inpatient Hospital Stay (HOSPITAL_COMMUNITY): Payer: Medicare Other

## 2017-11-18 LAB — GLUCOSE, CAPILLARY
Glucose-Capillary: 134 mg/dL — ABNORMAL HIGH (ref 65–99)
Glucose-Capillary: 145 mg/dL — ABNORMAL HIGH (ref 65–99)
Glucose-Capillary: 149 mg/dL — ABNORMAL HIGH (ref 65–99)
Glucose-Capillary: 173 mg/dL — ABNORMAL HIGH (ref 65–99)
Glucose-Capillary: 217 mg/dL — ABNORMAL HIGH (ref 65–99)
Glucose-Capillary: 238 mg/dL — ABNORMAL HIGH (ref 65–99)

## 2017-11-18 LAB — COMPREHENSIVE METABOLIC PANEL
ALT: 47 U/L (ref 14–54)
AST: 66 U/L — ABNORMAL HIGH (ref 15–41)
Albumin: 2.2 g/dL — ABNORMAL LOW (ref 3.5–5.0)
Alkaline Phosphatase: 117 U/L (ref 38–126)
Anion gap: 6 (ref 5–15)
BUN: 16 mg/dL (ref 6–20)
CO2: 24 mmol/L (ref 22–32)
Calcium: 7.7 mg/dL — ABNORMAL LOW (ref 8.9–10.3)
Chloride: 102 mmol/L (ref 101–111)
Creatinine, Ser: 0.99 mg/dL (ref 0.44–1.00)
GFR calc Af Amer: >60 mL/min (ref >60)
GFR calc non Af Amer: 59 mL/min — ABNORMAL LOW (ref >60)
Glucose, Bld: 143 mg/dL — ABNORMAL HIGH (ref 65–99)
Potassium: 3.5 mmol/L (ref 3.5–5.1)
Sodium: 132 mmol/L — ABNORMAL LOW (ref 135–145)
Total Bilirubin: 4.8 mg/dL — ABNORMAL HIGH (ref 0.3–1.2)
Total Protein: 4.6 g/dL — ABNORMAL LOW (ref 6.5–8.1)

## 2017-11-18 LAB — PROTIME-INR
INR: 1.58
Prothrombin Time: 18.7 seconds — ABNORMAL HIGH (ref 11.4–15.2)

## 2017-11-18 LAB — CBC
HCT: 28.8 % — ABNORMAL LOW (ref 36.0–46.0)
Hemoglobin: 9.2 g/dL — ABNORMAL LOW (ref 12.0–15.0)
MCH: 25.8 pg — ABNORMAL LOW (ref 26.0–34.0)
MCHC: 31.9 g/dL (ref 30.0–36.0)
MCV: 80.7 fL (ref 78.0–100.0)
Platelets: 51 10*3/uL — ABNORMAL LOW (ref 150–400)
RBC: 3.57 MIL/uL — ABNORMAL LOW (ref 3.87–5.11)
RDW: 23.6 % — ABNORMAL HIGH (ref 11.5–15.5)
WBC: 14.3 10*3/uL — ABNORMAL HIGH (ref 4.0–10.5)

## 2017-11-18 LAB — AMMONIA: Ammonia: 63 umol/L — ABNORMAL HIGH (ref 9–35)

## 2017-11-18 MED ORDER — FUROSEMIDE 10 MG/ML IJ SOLN
40.0000 mg | Freq: Two times a day (BID) | INTRAMUSCULAR | Status: DC
  Administered 2017-11-18 – 2017-11-21 (×6): 40 mg via INTRAVENOUS
  Filled 2017-11-18 (×6): qty 4

## 2017-11-18 MED ORDER — MAGIC MOUTHWASH
10.0000 mL | Freq: Four times a day (QID) | ORAL | Status: DC | PRN
  Administered 2017-11-18 – 2017-11-25 (×11): 10 mL via ORAL
  Filled 2017-11-18 (×15): qty 10

## 2017-11-18 MED ORDER — ALBUMIN HUMAN 5 % IV SOLN
12.5000 g | Freq: Once | INTRAVENOUS | Status: AC
  Administered 2017-11-18: 12.5 g via INTRAVENOUS
  Filled 2017-11-18: qty 250

## 2017-11-18 MED ORDER — LACTULOSE 10 GM/15ML PO SOLN
20.0000 g | Freq: Two times a day (BID) | ORAL | Status: DC
  Administered 2017-11-18 – 2017-11-26 (×17): 20 g via ORAL
  Filled 2017-11-18 (×12): qty 30
  Filled 2017-11-18: qty 15
  Filled 2017-11-18 (×5): qty 30

## 2017-11-18 MED ORDER — ATORVASTATIN CALCIUM 20 MG PO TABS
20.0000 mg | ORAL_TABLET | Freq: Every day | ORAL | Status: DC
  Administered 2017-11-18: 20 mg via ORAL
  Filled 2017-11-18: qty 1

## 2017-11-18 NOTE — Progress Notes (Signed)
Pt ambulated 14ft w/RN and NT; returned to bed.  Preparing to transport for abdominal ultrasound.

## 2017-11-18 NOTE — Progress Notes (Signed)
Patient ID: Denise Cuevas, female   DOB: Feb 21, 1951, 66 y.o.   MRN: [id number] TCTS DAILY ICU PROGRESS NOTE                   [address]            [place] [address]          [phone]   6 Days Post-Op Procedure(s) (LRB): REDO STERNOTOMY (N/A) AORTIC VALVE REPLACEMENT (AVR)  USING 19 MM MAGNA EASE PERICARDIAL BIOPROSTHESIS-AORTIC, MODEL 3300TFX (N/A) MITRAL VALVE (MV) REPLACEMENT USING 25 MM MAGNA MITRAL EASE PERICARDIAL BIOPROSTHESIS, MODEL 7300TFX (N/A) TRANSESOPHAGEAL ECHOCARDIOGRAM (TEE) (N/A) ARTERIAL LINE INSERTION (Right)  Total Length of Stay:  LOS: 6 days   Subjective: Patient is awake alert walk three quarters of the unit this morning, she is mildly forgetful but carries on a conversation without difficulty.  Objective: Vital signs in last 24 hours: Temp:  [97.7 F (36.5 C)-99 F (37.2 C)] 98 F (36.7 C) (12/11 0412) Pulse Rate:  [46-108] 93 (12/11 0700) Cardiac Rhythm: Normal sinus rhythm (12/11 0000) Resp:  [14-39] 17 (12/11 0700) BP: (75-124)/(31-96) 100/33 (12/11 0700) SpO2:  [90 %-100 %] 100 % (12/11 0700) Weight:  [190 lb 14.7 oz (86.6 kg)] 190 lb 14.7 oz (86.6 kg) (12/11 0500)  Filed Weights   11/15/17 0600 11/16/17 0300 11/18/17 0500  Weight: 185 lb 10 oz (84.2 kg) 190 lb 14.4 oz (86.6 kg) 190 lb 14.7 oz (86.6 kg)    Weight change:    Hemodynamic parameters for last 24 hours:    Intake/Output from previous day: 12/10 0701 - 12/11 0700 In: 1430 [P.O.:1260; I.V.:120; IV Piggyback:50] Out: 950 [Urine:950]  Intake/Output this shift: No intake/output data recorded.  Current Meds: Scheduled Meds: . amiodarone  200 mg Oral BID  . aspirin EC  81 mg Oral Daily   Or  . aspirin  81 mg Per Tube Daily  . atorvastatin  40 mg Oral q1800  . Chlorhexidine Gluconate Cloth  6 each Topical Daily  . DULoxetine  90 mg Oral Q lunch  . ferrous sulfate  325 mg Oral Q breakfast  . insulin aspart  0-24 Units Subcutaneous Q4H  . lactulose   20 g Oral Daily  . levothyroxine  50 mcg Oral QAC breakfast  . mouth rinse  15 mL Mouth Rinse BID  . pantoprazole  40 mg Oral Daily  . patient's guide to using coumadin book   Does not apply Once  . pneumococcal 23 valent vaccine  0.5 mL Intramuscular Tomorrow-1000  . sodium chloride flush  10-40 mL Intracatheter Q12H  . sodium chloride flush  3 mL Intravenous Q12H  . warfarin  2.5 mg Oral q1800  . Warfarin - Physician Dosing Inpatient   Does not apply q1800   Continuous Infusions: . sodium chloride Stopped (11/13/17 1900)  . sodium chloride Stopped (11/13/17 0700)  . lactated ringers 20 mL/hr at 11/15/17 0338   PRN Meds:.sodium chloride, Maicol Bowland's butt cream, mometasone-formoterol, morphine injection, ondansetron (ZOFRAN) IV, oxyCODONE, phenol, sodium chloride flush, sodium chloride flush, traMADol  General appearance: alert, cooperative and appears older than stated age Neurologic: intact Heart: regular rate and rhythm, S1, S2 normal, no murmur, click, rub or gallop Lungs: diminished breath sounds bibasilar Abdomen: soft, non-tender; bowel sounds normal; no masses,  no organomegaly Extremities: edema 2+ edema at the ankles bilateral Wound: Sternum is stable and healing well  Lab Results: CBC: Recent Labs    11/17/17 0427 11/18/17 0606  WBC  9.3 14.3*  HGB 9.3* 9.2*  HCT 29.3* 28.8*  PLT 38* 51*   BMET:  Recent Labs    11/17/17 0427 11/18/17 0606  NA 135 132*  K 3.4* 3.5  CL 105 102  CO2 22 24  GLUCOSE 149* 143*  BUN 17 16  CREATININE 0.93 0.99  CALCIUM 7.8* 7.7*    CMET: Lab Results  Component Value Date   WBC 14.3 (H) 11/18/2017   HGB 9.2 (L) 11/18/2017   HCT 28.8 (L) 11/18/2017   PLT 51 (L) 11/18/2017   GLUCOSE 143 (H) 11/18/2017   CHOL 121 10/18/2017   TRIG 61 10/18/2017   HDL 33 (L) 10/18/2017   LDLCALC 76 10/18/2017   ALT 47 11/18/2017   AST 66 (H) 11/18/2017   NA 132 (L) 11/18/2017   K 3.5 11/18/2017   CL 102 11/18/2017   CREATININE 0.99  11/18/2017   BUN 16 11/18/2017   CO2 24 11/18/2017   TSH 2.664 10/15/2017   INR 1.58 11/18/2017   HGBA1C 7.5 (H) 11/10/2017      PT/INR:  Recent Labs    11/18/17 0606  LABPROT 18.7*  INR 1.58   Radiology: Dg Chest Port 1 View  Result Date: 11/17/2017 CLINICAL DATA:  RIGHT PICC line placement EXAM: PORTABLE CHEST 1 VIEW COMPARISON:  Portable exam 1154 hours compared to 11/17/2017 at 0757 hours FINDINGS: RIGHT jugular central catheter tip projects over proximal SVC. RIGHT arm PICC line with tip projecting over SVC. Enlargement of cardiac silhouette post MVR and AVR. Epicardial pacing leads and coronary artery stents. Persistent bibasilar atelectasis greater on RIGHT and question RIGHT pleural effusion. Upper lungs clear. IMPRESSION: Tip of RIGHT arm PICC line projects over SVC. Enlargement of cardiac silhouette post MVR, AVR, and coronary stenting. Bibasilar atelectasis RIGHT greater than LEFT with question small RIGHT pleural effusion. Electronically Signed   By: Lavonia Dana M.D.   On: 11/17/2017 12:19   Dg Chest Port 1 View  Result Date: 11/17/2017 CLINICAL DATA:  Cardiac valve replacements. EXAM: PORTABLE CHEST 1 VIEW COMPARISON:  11/15/2017 FINDINGS: Evidence for aortic and mitral valve replacements. Right jugular central line is present. Low lung volumes with persistent densities at the right lung base and elevation of the right hemidiaphragm. Slightly improved aeration at both lung bases. Upper lungs are clear without pulmonary edema. Heart size remains enlarged. Median sternotomy wires are present. IMPRESSION: Slightly improved aeration at both lung bases. Persistent densities at the right lung base with volume loss. Electronically Signed   By: Markus Daft M.D.   On: 11/17/2017 08:17     Assessment/Plan: S/P Procedure(s) (LRB): REDO STERNOTOMY (N/A) AORTIC VALVE REPLACEMENT (AVR)  USING 19 MM MAGNA EASE PERICARDIAL BIOPROSTHESIS-AORTIC, MODEL 3300TFX (N/A) MITRAL VALVE (MV)  REPLACEMENT USING 25 MM MAGNA MITRAL EASE PERICARDIAL BIOPROSTHESIS, MODEL 7300TFX (N/A) TRANSESOPHAGEAL ECHOCARDIOGRAM (TEE) (N/A) ARTERIAL LINE INSERTION (Right) Mobilize Diuresis Foley and right IJ lines are out Mild elevation of white count Ammonia level is 63, bilirubin up to 4.8 Continue with active physical therapy and increase mobility Platelet count up to 51,000, HIT level still pending  [name] 11/18/2017 7:28 AM

## 2017-11-18 NOTE — Progress Notes (Signed)
EKG confirms Afib RVR w/rate 110.  Again, patient is aymptomatic.  Dr. Servando Snare updated.  New order received for 5% Albumin x one dose and to hold Lasix scheduled for 1800 this evening.  No other new orders.

## 2017-11-18 NOTE — Consult Note (Signed)
Referring Provider: Dr. Servando Snare Primary Care Physician:  [name], MD Primary Gastroenterologist:  Dr. Paulita Fujita   Reason for Consultation:  Elevated LFTs  HPI: Denise Cuevas is a 66 y.o. female with past medical history of cirrhosis probably from [place], history of COPD, history of coronary artery disease underwent aortic and mitral valve replacement earlier this admission. Patient was found to have elevated LFTs. GI is consulted for further evaluation.  Patient seen and examined at bedside. Family at bedside. Patient is complaining of generalized weakness. Complaining of occasional nausea but denied any abdominal pain. Complaining of constipation while on pain medication but denied any diarrhea. Denied any blood in the stool or black stool.    Previous GI history ------------------------ Patient was last seen by my partner Dr. Paulita Fujita in June 2016. Subsequently she had no show to the office.  EGD -08/2014 by Dr. Paulita Fujita showed small esophageal varices and long segment Barrett's. Biopsy was not performed because of underlying esophageal varices. It also showed some nodularity in the antrum.  Colonoscopy 08/2014 showed internal hemorrhoids and small tubular adenoma in  transverse colon  Repeat was recommended in 5 years  Past Medical History:  Diagnosis Date  . Angina   . Asthma   . CHF (congestive heart failure) ([place])   . 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 COPD (chronic obstructive pulmonary disease) ([place])   . Coronary artery disease    s/p CABG  . Depression   . Diabetes mellitus    type 2  . Dyspnea   . Edema extremities    consistent edema lower legs, feet, and right quadrant abdominal pain-"goes and comes"  . Family history of adverse reaction to anesthesia    SISTER HAS NAUSEA  . Fibromyalgia   . GERD (gastroesophageal reflux disease)   . H/O hiatal hernia   . Headache(784.0)    occ. generalized  . Heart murmur   . Hypercholesteremia   . Hypertension    . Hypothyroid   . Myocardial infarction [facility]) 11/2011   MI x2- 3'71,6'96 coronary stent(chest pain episode at time)  . PONV (postoperative nausea and vomiting)     Past Surgical History:  Procedure Laterality Date  . AORTIC VALVE REPLACEMENT N/A 11/12/2017   Procedure: AORTIC VALVE REPLACEMENT (AVR)  USING 19 MM MAGNA EASE PERICARDIAL BIOPROSTHESIS-AORTIC, MODEL 3300TFX;  Surgeon: [name], MD;  Location: [place];  Service: Open Heart Surgery;  Laterality: N/A;  . ARTERIAL LINE INSERTION Right 11/12/2017   Procedure: ARTERIAL LINE INSERTION;  Surgeon: [name], MD;  Location: [place];  Service: Open Heart Surgery;  Laterality: Right;  placed in right femoral artery.  . CHOLECYSTECTOMY     open many yrs ago  . COLONOSCOPY WITH PROPOFOL N/A 08/24/2014   Procedure: COLONOSCOPY WITH PROPOFOL;  Surgeon: Arta Silence, MD;  Location: [facility] ENDOSCOPY;  Service: Endoscopy;  Laterality: N/A;  . CORONARY ANGIOPLASTY WITH STENT PLACEMENT  05/06/2012  . CORONARY ARTERY BYPASS GRAFT  11/27/2011   Procedure: CORONARY ARTERY BYPASS GRAFTING (CABG);  Surgeon: [name], MD;  Location: [place];  Service: Open Heart Surgery;  Laterality: N/A;  coronary artery bypass graft on pump times two utilizing left internal mammary artery and right saphenous vein harvested endoscopically   . ESOPHAGOGASTRODUODENOSCOPY (EGD) WITH PROPOFOL N/A 08/24/2014   Procedure: ESOPHAGOGASTRODUODENOSCOPY (EGD) WITH PROPOFOL;  Surgeon: Arta Silence, MD;  Location: [facility] ENDOSCOPY;  Service: Endoscopy;  Laterality: N/A;  . KNEE SURGERY Right    scope surgery  .  LEFT HEART CATHETERIZATION WITH CORONARY ANGIOGRAM N/A 11/19/2011   Procedure: LEFT HEART CATHETERIZATION WITH CORONARY ANGIOGRAM;  Surgeon: Candee Furbish, MD;  Location: [facility] CATH LAB;  Service: Cardiovascular;  Laterality: N/A;  . LEFT HEART CATHETERIZATION WITH CORONARY ANGIOGRAM  05/06/2012   Procedure: LEFT HEART CATHETERIZATION WITH CORONARY ANGIOGRAM;   Surgeon: Jettie Booze, MD;  Location: [facility] CATH LAB;  Service: Cardiovascular;;  . LEFT HEART CATHETERIZATION WITH CORONARY/GRAFT ANGIOGRAM N/A 03/24/2012   Procedure: LEFT HEART CATHETERIZATION WITH Beatrix Fetters;  Surgeon: Jettie Booze, MD;  Location: [facility] CATH LAB;  Service: Cardiovascular;  Laterality: N/A;  . MITRAL VALVE REPLACEMENT N/A 11/12/2017   Procedure: MITRAL VALVE (MV) REPLACEMENT USING 25 MM MAGNA MITRAL EASE PERICARDIAL BIOPROSTHESIS, MODEL 7300TFX;  Surgeon: [name], MD;  Location: [place];  Service: Open Heart Surgery;  Laterality: N/A;  Using 36mm Magna Ease Mitral Pericardial Bioprosthesis  . PERCUTANEOUS CORONARY STENT INTERVENTION (PCI-S)  03/24/2012   Procedure: PERCUTANEOUS CORONARY STENT INTERVENTION (PCI-S);  Surgeon: Jettie Booze, MD;  Location: [facility] CATH LAB;  Service: Cardiovascular;;  . PERCUTANEOUS CORONARY STENT INTERVENTION (PCI-S) Bilateral 05/06/2012   Procedure: PERCUTANEOUS CORONARY STENT INTERVENTION (PCI-S);  Surgeon: Jettie Booze, MD;  Location: [facility] CATH LAB;  Service: Cardiovascular;  Laterality: Bilateral;  . RIGHT/LEFT HEART CATH AND CORONARY/GRAFT ANGIOGRAPHY N/A 10/20/2017   Procedure: RIGHT/LEFT HEART CATH AND CORONARY/GRAFT ANGIOGRAPHY;  Surgeon: Burnell Blanks, MD;  Location: [place] CV LAB;  Service: Cardiovascular;  Laterality: N/A;  . TEE WITHOUT CARDIOVERSION N/A 10/20/2017   Procedure: TRANSESOPHAGEAL ECHOCARDIOGRAM (TEE);  Surgeon: Dorothy Spark, MD;  Location: [facility] ENDOSCOPY;  Service: Cardiovascular;  Laterality: N/A;  . TEE WITHOUT CARDIOVERSION N/A 11/12/2017   Procedure: TRANSESOPHAGEAL ECHOCARDIOGRAM (TEE);  Surgeon: [name], MD;  Location: [place];  Service: Open Heart Surgery;  Laterality: N/A;    Prior to Admission medications   Medication Sig Start Date End Date Taking? Authorizing Provider  albuterol (PROVENTIL HFA;VENTOLIN HFA) 108 (90 BASE) MCG/ACT inhaler Inhale 2 puffs  into the lungs every 6 (six) hours as needed for wheezing or shortness of breath. 02/04/15  Yes Ghimire, Henreitta Leber, MD  albuterol (PROVENTIL) (2.5 MG/3ML) 0.083% nebulizer solution Take 3 mLs (2.5 mg total) by nebulization every 2 (two) hours as needed for wheezing. 02/04/15  Yes Ghimire, Henreitta Leber, MD  aspirin 81 MG tablet Take 81 mg by mouth daily.   Yes [provider]  atorvastatin (LIPITOR) 40 MG tablet Take 40 mg by mouth daily.   Yes [provider]  budesonide-formoterol (SYMBICORT) 80-4.5 MCG/ACT inhaler Inhale 2 puffs into the lungs 2 (two) times daily as needed (shortness of breath).    Yes [provider]  DULoxetine (CYMBALTA) 30 MG capsule Take 30 mg by mouth daily with lunch. In conjunction with one 60 mg capsule to equal a total of 90 milligrams   Yes [provider]  DULoxetine (CYMBALTA) 60 MG capsule Take 60 mg by mouth daily with lunch. In conjunction with one 30 mg capsule to equal a total of 90 milligrams   Yes [provider]  ferrous sulfate 325 (65 FE) MG tablet Take 1 tablet (325 mg total) daily with breakfast by mouth. 10/23/17  Yes Rogue Bussing, MD  furosemide (LASIX) 40 MG tablet Take 1 tablet (40 mg total) by mouth daily. 11/22/16  Yes Hongalgi, Lenis Dickinson, MD  HUMALOG MIX 75/25 KWIKPEN (75-25) 100 UNIT/ML Kwikpen INJECT 60 UNITS SUBCUTANEOUSLY BEFORE BREAKFAST AND 60 UNITS BEFORE EVENING MEAL  09/11/17  Yes [provider]  HYDROcodone-homatropine (HYCODAN) 5-1.5 MG/5ML syrup Take 5 mLs by mouth every 6 (six) hours as needed for cough. 10/29/17  Yes Fitzgerald, Sharman Cheek, MD  JARDIANCE 25 MG TABS tablet Take 25 mg daily by mouth. 10/12/17  Yes [provider]  levothyroxine (SYNTHROID, LEVOTHROID) 50 MCG tablet Take 50 mcg by mouth daily before breakfast.   Yes [provider]  metoprolol tartrate (LOPRESSOR) 25 MG tablet Take 12.5 mg by mouth 2 (two) times daily.    Yes [provider]   mometasone-formoterol (DULERA) 100-5 MCG/ACT AERO Inhale 2 puffs into the lungs 2 (two) times daily as needed for wheezing or shortness of breath.   Yes [provider]  omeprazole-sodium bicarbonate (ZEGERID) 40-1100 MG per capsule Take 1 capsule by mouth daily before breakfast.   Yes [provider]  spironolactone (ALDACTONE) 25 MG tablet Take 1 tablet (25 mg total) by mouth daily. 11/23/16  Yes Hongalgi, Lenis Dickinson, MD  nitroGLYCERIN (NITROSTAT) 0.4 MG SL tablet Place 0.4 mg under the tongue every 5 (five) minutes as needed for chest pain. For chest pain    [provider]    Scheduled Meds: . amiodarone  200 mg Oral BID  . aspirin EC  81 mg Oral Daily   Or  . aspirin  81 mg Per Tube Daily  . atorvastatin  20 mg Oral q1800  . Chlorhexidine Gluconate Cloth  6 each Topical Daily  . DULoxetine  90 mg Oral Q lunch  . ferrous sulfate  325 mg Oral Q breakfast  . furosemide  40 mg Intravenous BID  . insulin aspart  0-24 Units Subcutaneous Q4H  . lactulose  20 g Oral BID  . levothyroxine  50 mcg Oral QAC breakfast  . mouth rinse  15 mL Mouth Rinse BID  . pantoprazole  40 mg Oral Daily  . patient's guide to using coumadin book   Does not apply Once  . pneumococcal 23 valent vaccine  0.5 mL Intramuscular Tomorrow-1000  . sodium chloride flush  10-40 mL Intracatheter Q12H  . sodium chloride flush  3 mL Intravenous Q12H  . warfarin  2.5 mg Oral q1800  . Warfarin - Physician Dosing Inpatient   Does not apply q1800   Continuous Infusions: . sodium chloride Stopped (11/13/17 1900)  . sodium chloride Stopped (11/13/17 0700)  . lactated ringers 20 mL/hr at 11/15/17 0338   PRN Meds:.sodium chloride, Gerhardt's butt cream, mometasone-formoterol, morphine injection, ondansetron (ZOFRAN) IV, oxyCODONE, phenol, sodium chloride flush, sodium chloride flush, traMADol  Allergies as of 11/05/2017 - Review Complete 11/03/2017  Allergen Reaction Noted  . Exenatide Nausea And  Vomiting 11/19/2011  . Ace inhibitors Other (See Comments) 04/20/2015    Family History  Problem Relation Age of Onset  . Heart disease Father 41       died of MI  . Emphysema Father        smoked  . Peripheral vascular disease Mother 53       bilaterial amputations    Social History   Socioeconomic History  . Marital status: Married    Spouse name: Not on file  . Number of children: Not on file  . Years of education: Not on file  . Highest education level: Not on file  Social Needs  . Financial resource strain: Not on file  . Food insecurity - worry: Not on file  . Food insecurity - inability: Not on file  . Transportation needs - medical:  Not on file  . Transportation needs - non-medical: Not on file  Occupational History  . Occupation: Retired Doctor, hospital  Tobacco Use  . Smoking status: Never Smoker  . Smokeless tobacco: Never Used  Substance and Sexual Activity  . Alcohol use: No  . Drug use: No  . Sexual activity: Yes  Other Topics Concern  . Not on file  Social History Narrative   Retired Naval architect          Review of Systems: Review of Systems  Constitutional: Positive for malaise/fatigue. Negative for chills and fever.  HENT: Negative for hearing loss and tinnitus.   Eyes: Negative for blurred vision and double vision.  Respiratory: Positive for shortness of breath. Negative for cough and hemoptysis.   Cardiovascular: Positive for chest pain, claudication and leg swelling.  Gastrointestinal: Positive for constipation and nausea. Negative for abdominal pain, blood in stool, diarrhea, heartburn, melena and vomiting.  Genitourinary: Negative for dysuria and urgency.  Musculoskeletal: Positive for back pain and myalgias.  Skin: Negative for itching and rash.  Neurological: Positive for weakness. Negative for seizures and loss of consciousness.  Endo/Heme/Allergies: Does not bruise/bleed easily.  Psychiatric/Behavioral: Negative for  hallucinations and suicidal ideas.    Physical Exam: Vital signs: Vitals:   11/18/17 0900 11/18/17 1000  BP: (!) 110/47 (!) 112/53  Pulse: 96 93  Resp: (!) 26 15  Temp:    SpO2: 100% 96%   Last BM Date: 11/17/17 Physical Exam  Constitutional: She is oriented to person, place, and time. She appears well-developed and well-nourished. No distress.  HENT:  Head: Normocephalic and atraumatic.  Mouth/Throat: No oropharyngeal exudate.  Eyes: EOM are normal. Scleral icterus is present.  Neck: Normal range of motion. Neck supple. No thyromegaly present.  Cardiovascular: Normal rate and regular rhythm.  Scar from recent surgery  Pulmonary/Chest: Effort normal.  Decreased breath sounds bilaterally  Abdominal: Soft. Bowel sounds are normal. She exhibits distension. There is no tenderness. There is no rebound and no guarding.  Musculoskeletal: Normal range of motion. She exhibits edema.  2+ lower extremity edema  Neurological: She is alert and oriented to person, place, and time.  Skin: Skin is warm. No erythema.  Psychiatric: She has a normal mood and affect. Thought content normal.  Vitals reviewed.   GI:  Lab Results: Recent Labs    11/16/17 0310 11/17/17 0427 11/18/17 0606  WBC 9.4 9.3 14.3*  HGB 9.3* 9.3* 9.2*  HCT 29.1* 29.3* 28.8*  PLT 44* 38* 51*   BMET Recent Labs    11/16/17 0310 11/17/17 0427 11/18/17 0606  NA 132* 135 132*  K 3.6 3.4* 3.5  CL 105 105 102  CO2 20* 22 24  GLUCOSE 143* 149* 143*  BUN 21* 17 16  CREATININE 0.86 0.93 0.99  CALCIUM 7.9* 7.8* 7.7*   LFT Recent Labs    11/18/17 0606  PROT 4.6*  ALBUMIN 2.2*  AST 66*  ALT 47  ALKPHOS 117  BILITOT 4.8*   PT/INR Recent Labs    11/17/17 0427 11/18/17 0606  LABPROT 17.6* 18.7*  INR 1.45 1.58     Studies/Results: Dg Chest Port 1 View  Result Date: 11/17/2017 CLINICAL DATA:  RIGHT PICC line placement EXAM: PORTABLE CHEST 1 VIEW COMPARISON:  Portable exam 1154 hours compared to  11/17/2017 at 0757 hours FINDINGS: RIGHT jugular central catheter tip projects over proximal SVC. RIGHT arm PICC line with tip projecting over SVC. Enlargement of cardiac silhouette post MVR and AVR. Epicardial pacing leads  and coronary artery stents. Persistent bibasilar atelectasis greater on RIGHT and question RIGHT pleural effusion. Upper lungs clear. IMPRESSION: Tip of RIGHT arm PICC line projects over SVC. Enlargement of cardiac silhouette post MVR, AVR, and coronary stenting. Bibasilar atelectasis RIGHT greater than LEFT with question small RIGHT pleural effusion. Electronically Signed   By: Lavonia Dana M.D.   On: 11/17/2017 12:19   Dg Chest Port 1 View  Result Date: 11/17/2017 CLINICAL DATA:  Cardiac valve replacements. EXAM: PORTABLE CHEST 1 VIEW COMPARISON:  11/15/2017 FINDINGS: Evidence for aortic and mitral valve replacements. Right jugular central line is present. Low lung volumes with persistent densities at the right lung base and elevation of the right hemidiaphragm. Slightly improved aeration at both lung bases. Upper lungs are clear without pulmonary edema. Heart size remains enlarged. Median sternotomy wires are present. IMPRESSION: Slightly improved aeration at both lung bases. Persistent densities at the right lung base with volume loss. Electronically Signed   By: Markus Daft M.D.   On: 11/17/2017 08:17    Impression/Plan: - Elevated LFTs with bilirubin of 4.8, normal alkaline phosphatase, normal ALT and AST of 66 in patient with known cirrhosis. Patient has mild elevated LFTs since 2016 - Cirrhosis probably from [place]. MELD score 21 as of today.  - Status post aortic and mitral valve replacement/bioprosthetic on 11/12/2017.  Currently on anticoagulation with Coumadin along with aspirin  - Small esophageal varices based on EGD in September 2015 - Personal history of adenomatous polyp. Last colonoscopy in September 2015. - Chronic anemia -  Thrombocytopenia  Recommendations ----------------------- - Patient with known cirrhosis. Meld score 21 as of today's blood work. Ultrasound on 10/10/2017 showed cirrhosis otherwise no other significant abnormalities. - Elevated LFTs could be just from recent anesthesia medication could be mild decompensation of her underlying cirrhosis -  Patient is alert and oriented 3. She has no evidence of encephalopathy. No evidence of any active GI bleed. - Repeat ultrasound. - Recheck LFTs and INR tomorrow. - GI will follow   LOS: 6 days   Otis Brace  MD, FACP 11/18/2017, 10:56 AM  Contact #  [phone]

## 2017-11-18 NOTE — Plan of Care (Signed)
Reviewed need for continued use of IS and performed demonstration/teach-back of such; also reviewed compliance w/carb modifiied diet and CBGs.  Pt continues to experience pain and numbness in her Left wrist/hand area - OT consult placed per physician.

## 2017-11-18 NOTE — Progress Notes (Signed)
Pt w/BP 88/44 (59); P108.  Review of nursing notes and telemetry indicate patient has been in/out of atrial fibrillation vs. NSR for the past 48 hrs at least, hx of atrial paced per nursing notes on 12/7 and 12/8.  Atrial fib not necessarily new but hypotension is lower than baseline.  EKG ordered to confirm rhythm.  Pt asymptomatic.  Dr. Servando Snare to be updated after EKG completed.

## 2017-11-18 NOTE — Progress Notes (Signed)
Physical Therapy Treatment Patient Details Name: Denise Cuevas MRN: [id number] DOB: [birth year] Today's Date: 11/18/2017    History of Present Illness Pt adm on 12/5 for AVR and MVR. PMH - CABG, chf, idiopathic cirrhosis, htn, dm    PT Comments    Pt making excellent progress with mobility. Continue to feel she will be able to return home with family at dc.   Follow Up Recommendations  Home health PT;Supervision - Intermittent     Equipment Recommendations  3in1 (PT);Other (comment)(Possibly rollator)    Recommendations for Other Services       Precautions / Restrictions Precautions Precautions: Sternal;Fall Restrictions Other Position/Activity Restrictions: sternal precautions    Mobility  Bed Mobility               General bed mobility comments: Pt up in chair  Transfers Overall transfer level: Needs assistance Equipment used: 4-wheeled walker Transfers: Sit to/from Stand Sit to Stand: Min assist         General transfer comment: Assist to bring hips up.  Ambulation/Gait Ambulation/Gait assistance: Min guard Ambulation Distance (Feet): 350 Feet Assistive device: 4-wheeled walker Gait Pattern/deviations: Step-through pattern;Decreased stride length Gait velocity: decr Gait velocity interpretation: Below normal speed for age/gender General Gait Details: Assist for balance and support. HR 100. SpO2 >94%   Stairs            Wheelchair Mobility    Modified Rankin (Stroke Patients Only)       Balance Overall balance assessment: Needs assistance Sitting-balance support: Feet supported;No upper extremity supported Sitting balance-Leahy Scale: Fair     Standing balance support: No upper extremity supported Standing balance-Leahy Scale: Fair                              Cognition Arousal/Alertness: Awake/alert Behavior During Therapy: WFL for tasks assessed/performed Overall Cognitive Status: Within Functional Limits for  tasks assessed                                        Exercises      General Comments        Pertinent Vitals/Pain Pain Assessment: Faces Faces Pain Scale: Hurts little more Pain Location: lt wrist Pain Descriptors / Indicators: Grimacing;Guarding Pain Intervention(s): Limited activity within patient's tolerance;Monitored during session    Home Living                      Prior Function            PT Goals (current goals can now be found in the care plan section) Progress towards PT goals: Progressing toward goals    Frequency    Min 3X/week      PT Plan Current plan remains appropriate    Co-evaluation              AM-PAC PT "6 Clicks" Daily Activity  Outcome Measure  Difficulty turning over in bed (including adjusting bedclothes, sheets and blankets)?: Unable Difficulty moving from lying on back to sitting on the side of the bed? : Unable Difficulty sitting down on and standing up from a chair with arms (e.g., wheelchair, bedside commode, etc,.)?: Unable Help needed moving to and from a bed to chair (including a wheelchair)?: A Little Help needed walking in hospital room?: A Little Help needed climbing 3-5 steps with a railing? :  A Little 6 Click Score: 12    End of Session Equipment Utilized During Treatment: Gait belt Activity Tolerance: Patient tolerated treatment well Patient left: with call bell/phone within reach;with family/visitor present;in chair   PT Visit Diagnosis: Unsteadiness on feet (R26.81);Muscle weakness (generalized) (M62.81)     Time: 3149-7026 PT Time Calculation (min) (ACUTE ONLY): 16 min  Charges:  $Gait Training: 8-22 mins                    G Codes:       [facility] PT [place] 11/18/2017, 11:59 AM

## 2017-11-18 NOTE — Progress Notes (Signed)
[facility] BRIEF SICU PROGRESS NOTE  6 Days Post-Op  S/P Procedure(s) (LRB): REDO STERNOTOMY (N/A) AORTIC VALVE REPLACEMENT (AVR)  USING 19 MM MAGNA EASE PERICARDIAL BIOPROSTHESIS-AORTIC, MODEL 3300TFX (N/A) MITRAL VALVE (MV) REPLACEMENT USING 25 MM MAGNA MITRAL EASE PERICARDIAL BIOPROSTHESIS, MODEL 7300TFX (N/A) TRANSESOPHAGEAL ECHOCARDIOGRAM (TEE) (N/A) ARTERIAL LINE INSERTION (Right)   Stable day  Plan: Continue current plan  Rexene Alberts, MD 11/18/2017 8:54 PM

## 2017-11-18 NOTE — Plan of Care (Signed)
Pt seems to be progressing slowly; however, she does seem to be making continuous improvements.

## 2017-11-18 NOTE — Progress Notes (Signed)
Dr. Servando Snare at bedside for patient evaluation.  Pt and family updated.  Albumin administered at this time.

## 2017-11-18 NOTE — Progress Notes (Signed)
Dr. Rosalie Gums at bedside for evaluation.  New orders received.

## 2017-11-18 NOTE — Progress Notes (Signed)
Pt transporting to ultrasound at this time per SWOT RN and transport; monitored.

## 2017-11-18 NOTE — Progress Notes (Signed)
Progress Note  Patient Name: Denise Cuevas Date of Encounter: 11/18/2017  Primary Cardiologist: No primary care provider on file.   Subjective   OOB to chair, reports she ambulated today already, felt good, no CP, palpitations, minimal SOB  Inpatient Medications    Scheduled Meds: . amiodarone  200 mg Oral BID  . aspirin EC  81 mg Oral Daily   Or  . aspirin  81 mg Per Tube Daily  . atorvastatin  20 mg Oral q1800  . Chlorhexidine Gluconate Cloth  6 each Topical Daily  . DULoxetine  90 mg Oral Q lunch  . ferrous sulfate  325 mg Oral Q breakfast  . furosemide  40 mg Intravenous BID  . insulin aspart  0-24 Units Subcutaneous Q4H  . lactulose  20 g Oral BID  . levothyroxine  50 mcg Oral QAC breakfast  . mouth rinse  15 mL Mouth Rinse BID  . pantoprazole  40 mg Oral Daily  . patient's guide to using coumadin book   Does not apply Once  . pneumococcal 23 valent vaccine  0.5 mL Intramuscular Tomorrow-1000  . sodium chloride flush  10-40 mL Intracatheter Q12H  . sodium chloride flush  3 mL Intravenous Q12H  . warfarin  2.5 mg Oral q1800  . Warfarin - Physician Dosing Inpatient   Does not apply q1800   Continuous Infusions: . sodium chloride Stopped (11/13/17 1900)  . sodium chloride Stopped (11/13/17 0700)  . lactated ringers 20 mL/hr at 11/15/17 0338   PRN Meds: sodium chloride, Gerhardt's butt cream, mometasone-formoterol, morphine injection, ondansetron (ZOFRAN) IV, oxyCODONE, phenol, sodium chloride flush, sodium chloride flush, traMADol   Vital Signs    Vitals:   11/18/17 0500 11/18/17 0600 11/18/17 0627 11/18/17 0700  BP: (!) 102/44  (!) 101/39 (!) 100/33  Pulse: 84 91  93  Resp: 20 20 14 17   Temp:      TempSrc:      SpO2: 95% 100%  100%  Weight: 190 lb 14.7 oz (86.6 kg)     Height:        Intake/Output Summary (Last 24 hours) at 11/18/2017 0753 Last data filed at 11/18/2017 0200 Gross per 24 hour  Intake 1430 ml  Output 950 ml  Net 480 ml   Filed  Weights   11/15/17 0600 11/16/17 0300 11/18/17 0500  Weight: 185 lb 10 oz (84.2 kg) 190 lb 14.4 oz (86.6 kg) 190 lb 14.7 oz (86.6 kg)    Telemetry    SR, 70's-80's- Personally Reviewed  ECG    No new EKGs - Personally Reviewed  Physical Exam   GEN: No acute distress, chronically ill appearing, OOB to chair tis AM.   Neck: No JVD Cardiac: RRR, soft SM, no rubs, or gallops.  Respiratory: diminished at the bases. GI: Soft, nontender, non-distended  MS: ++ edema; No deformity. Neuro:  Nonfocal  Psych: Normal affect   Labs    Chemistry Recent Labs  Lab 11/14/17 0308  11/16/17 0310 11/17/17 0427 11/18/17 0606  NA 138   < > 132* 135 132*  K 3.6   < > 3.6 3.4* 3.5  CL 107   < > 105 105 102  CO2 23   < > 20* 22 24  GLUCOSE 214*   < > 143* 149* 143*  BUN 23*   < > 21* 17 16  CREATININE 1.09*   < > 0.86 0.93 0.99  CALCIUM 8.1*   < > 7.9* 7.8* 7.7*  PROT  4.3*  --   --  4.4* 4.6*  ALBUMIN 2.8*  --   --  2.2* 2.2*  AST 186*  --   --  63* 66*  ALT 75*  --   --  46 47  ALKPHOS 86  --   --  104 117  BILITOT 3.7*  --   --  3.8* 4.8*  GFRNONAA 52*   < > >60 >60 59*  GFRAA >60   < > >60 >60 >60  ANIONGAP 8   < > 7 8 6    < > = values in this interval not displayed.     Hematology Recent Labs  Lab 11/16/17 0310 11/17/17 0427 11/18/17 0606  WBC 9.4 9.3 14.3*  RBC 3.69* 3.65* 3.57*  HGB 9.3* 9.3* 9.2*  HCT 29.1* 29.3* 28.8*  MCV 78.9 80.3 80.7  MCH 25.2* 25.5* 25.8*  MCHC 32.0 31.7 31.9  RDW 22.2* 23.1* 23.6*  PLT 44* 38* 51*    Cardiac EnzymesNo results for input(s): TROPONINI in the last 168 hours. No results for input(s): TROPIPOC in the last 168 hours.   BNPNo results for input(s): BNP, PROBNP in the last 168 hours.   DDimer No results for input(s): DDIMER in the last 168 hours.   Radiology      Cardiac Studies   10/16/17: TTE Study Conclusions - Left ventricle: The cavity size was normal. Systolic function was normal. The estimated ejection fraction  was in the range of 60% to 65%. Wall motion was normal; there were no regional wall motion abnormalities. Mitral stenosis limits evaluation of LV diastolic function. Doppler parameters are consistent with elevated mean left atrial filling pressure. - Aortic valve: Valve mobility was restricted. There was moderate stenosis. Valve area (VTI): 1.05 cm^2. - Mitral valve: Calcified annulus. Mild diffuse thickening, consistent with rheumatic disease. Mobility was restricted. Transvalvular velocity was increased. The findings are consistent with mild to moderate stenosis. There was moderate regurgitation directed centrally. Mean gradient (D): 15 mm Hg. Gradients recorded at 88 bpm. Planimetered valve area: 1.53 cm^2. Valve area by pressure half-time: 1.49 cm^2. Valve area by continuity equation (using LVOT flow): 1.26 cm^2. - Left atrium: The atrium was mildly dilated.  10/20/17; TEE Impressions: - Aortic valve is severely calcified predominantly in the non-coronary leaflet, leaflet opening is moderately decreased but still opening. Peak/mean transaortic gradients were 64/33 mmHg. 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 There is at most moderate mitral regurgitation but moderate to severe mitral stenosis with mean transmitral gradient 10 mmHg.   10/20/17: R/LHC  Prox RCA to Mid RCA lesion is 20% stenosed.  Previously placed Ost Cx to Prox Cx stent (unknown type) is widely patent.  Previously placed 2nd Mrg stent (unknown type) is widely patent.  Ost 2nd Mrg lesion is 50% stenosed.  Prox LAD lesion is 100% stenosed.  SVG graft was not visualized.  LIMA graft was visualized by angiography and is normal in caliber.  The graft exhibits no disease.  Previously placed Mid LAD to Dist LAD stent (unknown type) is widely patent.  Ost LAD to Prox LAD lesion is 20%  stenosed.  There is moderate aortic valve stenosis.  There is moderate mitral valve stenosis.  Hemodynamic findings consistent with moderate pulmonary hypertension.  LV end diastolic pressure is normal. 1. Double vessel CAD s/p 2V CABG with 1/2 patent bypass grafts 2. The LAD courses to the apex. The mid LAD is occluded after a moderate caliber  diagonal branch. The mid and distal LAD fills from the patent LIMA graft. The stent in the distal LAD is patent without restenosis.  3. The Circumflex artery is patent. There are patent stents in the proximal Circumflex and distal segment of the obtuse marginal branch. The proximal segment of the OM branch prior to the stent has a moderate non-obstructive stenosis. The vein graft to the OM branch is known to be occluded and was not selectively engaged today.  4. The RCA is a small to moderate caliber co-dominant vessel with mild non-obstructive plaque.  5. Moderate Aortic stenosis (peak to peak gradient 23 mmHg, mean gradient 20 mmHg, AVA 2.2 cm2) 6. Moderately severe mitral stenosis (peak to peak gradient 14 mmHg, mean gradient 12.5 mmHg, MVA 3.32cm2) Recommendations: Medical management of CAD. Close follow up of her valve disease. Consider surgical consultation.     Patient Profile     66 y.o. female with a hx of CAD (CABG 2012, LIMA to LAD, SVG to OM withsubsequentDES to distal anastomosis SVG OM and distal LAD), HTN, HLD, DM, GERD, hypothyroid, cirrhosis, and most recently VHD now s/p AVR/MVR both bioprosthetic   Assessment & Plan    1.  P/o Afib, SR today      CHA2DS2Vasc is 4, warfarin started, on ASA      Plts up some to 51, INR today 1.58      Transition from IV to PO amiodarone 11/16/17, 200mg  BID  Ammonia level up to 63, AST/ALT similar to yesterday H/H stable       Hx of cirrhosis makes long-term amiodarone is not a great option.  I would suggest continuing this medication for approximately 4 weeks and then stopping it.  Her  warfarin levels will have to be followed carefully. She is not a great candidate for long-term amiodarone   2. VHD, s/p AVR/MVR (both bioprosthetic)     POD #6  3. CAD     Pre-op cath noted above, stable disease     No anginal complaints     Post-op/chest wall pain only        Continue with primary care team, EP service remains available, please re-call if needed.   For questions or updates, please contact [place] Please consult [email or web address] for contact info under Cardiology/STEMI.      Signed, Baldwin Jamaica, PA-C  11/18/2017, 7:53 AM    EP Attending  Patient appears to be much improved this morning. She denies chest pain or sob. Tele demonstrates NSR. At this point I would give amiodarone 400 mg daily for 4 weeks. Then stop amiodarone. Continue anti-coagulation, at least for now. We will sign off for now.   Mikle Bosworth.D.

## 2017-11-18 NOTE — Op Note (Signed)
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  SURGICAL PROCEDURE:  Redo sternotomy; cardiopulmonary bypass with replacement of aortic valve with [employer] pericardial tissue valve, model 3300TFX, 19 mm, serial #[id number], and mitral valve replacement with [employer] [facility] pericardial tissue valve, model 7300TFX, 25 mm, serial #[id number]; placement of right femoral arterial line.  SURGEON:  [name], MD.  FIRST ASSISTANT:  Lars Pinks, PA.  SECOND ASSISTANT:  Jadene Pierini, [place].  BRIEF HISTORY:  The patient is a 66 year old female, who 5 years previously had undergone coronary artery bypass grafting x2 with critical LAD disease.  At that time, left internal mammary was placed to the LAD and a vein to the circumflex.  Subsequently, the vein had occluded.  The patient did have a stent placed in the native circumflex vessel.  In addition, she has known nonalcoholic cirrhosis.  She was recently admitted to the Cardiology Service with acute on chronic heart failure, was diuresed, and slowly improved.  Further evaluation including surface echocardiogram, TEE, and cardiac catheterization demonstrated significant aortic stenosis and mitral stenosis with estimated peak gradients of 65 across the aortic valve and 15 across the mitral valve.  Overall, ventricular function was preserved.  Repeat cardiac catheterization showed the right coronary artery to be patent. The mammary to  the LAD was patent and LAD was totally occluded.  The stents in the circumflex were patent.  There was some minor narrowing between the 2 stents, but 40% to 50%.  With the patient's underlying medical problems, redo surgery carried some increased risk, especially with her history of cirrhosis.  However, because of the degree of symptoms at the time of her admission, it was felt that without double valve replacement, she would likely deteriorate in her clinical status further.  Risks and options were discussed with the patient in detail, and she was agreeable and signed informed consent.  DESCRIPTION OF PROCEDURE:  With Swan-Ganz and arterial line monitors in place, the patient underwent general endotracheal anesthesia.  A TEE probe was placed by Dr. Ermalene Postin confirming the previous TEE, dictated under separate note.  Chest, abdomen, and legs were prepped with Betadine and draped in sterile manner.  After appropriate time-out, we then proceeded with redo sternotomy through the previous incision, carried down to the bone.  The sternum was elevated slightly, and using a sagittal saw, the chest was entered, and after a tedious dissection, the pericardium was dissected open.  Ascending aorta was freed of adhesions.  The old vein graft was identified and dissection carried along the anterior wall and the left internal mammary artery was identified in the vicinity of the distal anastomosis to the point where we were able to  place a bulldog clamp on the mammary artery.  The patient was systemically heparinized.  The ascending aorta was cannulated, dual stage.  The superior and inferior vena cava were cannulated with right-angled metal-tipped cannulas.  Retrograde cardioplegic catheter was placed under ultrasound guidance into the coronary sinus.  The patient was placed on cardiopulmonary bypass 2.4 L/minute/sq m.  Body temperature was cooled to 32 degrees.  An aortic root vent cardioplegia needle  was introduced into the ascending aorta. Aortic crossclamp was applied.  500 mL of cold blood potassium cardioplegia was administered antegrade.  Additional cardioplegia was administered retrograde.  As we applied the aortic crossclamp, a bulldog clamp was also placed across the internal mammary artery.  Myocardial septal temperature was monitored throughout the crossclamp.  We first opened the aorta, a transverse aortotomy was performed which showed a trileaflet aortic valve with severe calcification and fibrosis of the noncoronary cusp.  It was severely limiting the opening of the valve. The valve was trimmed and excised.  The annulus was debrided slightly, though there was not a significant amount of calcium in the annulus. The valve was sized for a 19 pericardial valve.  The patient's body surface area was 1.7.  With the aortic valve excised, we then turned our attention to the mitral valve.  The left atrium was opened along the interatrial groove, and with a Koros retractor, we were able to visualize the mitral valve.  There was some difficulty as the patient's atrium was relatively small.  Intermittently, cold blood cardioplegia was administered retrograde.  The posterior leaflet was significantly calcified and fibrosed, the anterior leaflet less so.  The leaflets were excised and the mitral valve annulus sized for a 25 pericardial tissue valve.  We then placed #2 Tycron pledgeted sutures with pledgets on the atrial surface in a circumferential manner and used these to secure the valve in place, the valve seated well.  Core knot braids were then used to secure the sutures in place.  The left atrial appendage was not oversewn, as with our visibility and the small size of the atrium, this proved to be difficult to visualize well.  The atrium was then closed with a horizontal mattress 4-0 Prolene suture in 2 layers.  A vent was left across the suture line for further deairing later.  We  then turned our attention back to the aortic valve where #2 Tycron pledgeted sutures on the ventricular surface of the annulus were placed, a total of 14. The valve was then seated well without impingement of the right or left coronary ostium.  The valve was secured in place.  There was free mobility of the leaflets.  The aortic root was checked for any loose calcific debris and the aortotomy was closed with horizontal mattress 4- 0 Prolene suture over felt strips.  The patient's body temperature was rewarmed.  Bulldog on the mammary artery was removed with rise in myocardial septal temperature.  The aortic crossclamp was removed with total crossclamp time of 172 minutes.  The heart was allowed to passively fill and de-air through the aortic root and also the left ventricular vent.  The left ventricular vent was then removed and the atriotomy suture line was closed.  The patient returned to a sinus rhythm spontaneously.  She was started on dopamine and milrinone infusion, and with body temperature rewarmed to 37 degrees, was then ventilated and weaned from cardiopulmonary bypass.  TEE showed good position of both prosthetic valves without evidence of any perivalvular leak.  The patient remained hemodynamically stable, was decannulated in usual fashion.  Protamine sulfate was administered.  Because of the patient's low platelet count and history of cirrhosis, platelets and fresh frozen were administered after the protamine.  With the operative field hemostatic, the atrial and ventricular pacing wires were applied. The aortic root vent needle was removed.  Pericardium was then loosely reapproximated.  Two Blake mediastinal drains were left in place.  The sternum was closed with #6 stainless steel wire, fascia closed with interrupted 0 Vicryl, running 3-0 Vicryl on subcutaneous tissue, 3-0 subcuticular stitch on skin edges, dry dressings. A Prevena incisional wound VAC was placed.  The  patient was transferred in stable condition to the surgical intensive care unit for further postoperative care.  Sponge and needle count was reported as correct at completion of the procedure.  Total pump time was 222 minutes.     [name], MD   ______________________________ [name], MD    EG/MEDQ  D:  11/18/2017  T:  11/18/2017  Job:  [id number]

## 2017-11-19 LAB — CBC
HCT: 28.1 % — ABNORMAL LOW (ref 36.0–46.0)
Hemoglobin: 9.1 g/dL — ABNORMAL LOW (ref 12.0–15.0)
MCH: 25.7 pg — ABNORMAL LOW (ref 26.0–34.0)
MCHC: 32.4 g/dL (ref 30.0–36.0)
MCV: 79.4 fL (ref 78.0–100.0)
Platelets: 51 10*3/uL — ABNORMAL LOW (ref 150–400)
RBC: 3.54 MIL/uL — ABNORMAL LOW (ref 3.87–5.11)
RDW: 23.4 % — ABNORMAL HIGH (ref 11.5–15.5)
WBC: 16.8 10*3/uL — ABNORMAL HIGH (ref 4.0–10.5)

## 2017-11-19 LAB — HEPATIC FUNCTION PANEL
ALT: 43 U/L (ref 14–54)
AST: 67 U/L — ABNORMAL HIGH (ref 15–41)
Albumin: 2.5 g/dL — ABNORMAL LOW (ref 3.5–5.0)
Alkaline Phosphatase: 126 U/L (ref 38–126)
Bilirubin, Direct: 2.6 mg/dL — ABNORMAL HIGH (ref 0.1–0.5)
Indirect Bilirubin: 2.9 mg/dL — ABNORMAL HIGH (ref 0.3–0.9)
Total Bilirubin: 5.5 mg/dL — ABNORMAL HIGH (ref 0.3–1.2)
Total Protein: 4.6 g/dL — ABNORMAL LOW (ref 6.5–8.1)

## 2017-11-19 LAB — HEPARIN INDUCED PLATELET AB (HIT ANTIBODY): Heparin Induced Plt Ab: 0.25 OD (ref 0.000–0.400)

## 2017-11-19 LAB — BASIC METABOLIC PANEL
Anion gap: 9 (ref 5–15)
BUN: 14 mg/dL (ref 6–20)
CO2: 21 mmol/L — ABNORMAL LOW (ref 22–32)
Calcium: 7.9 mg/dL — ABNORMAL LOW (ref 8.9–10.3)
Chloride: 100 mmol/L — ABNORMAL LOW (ref 101–111)
Creatinine, Ser: 0.95 mg/dL (ref 0.44–1.00)
GFR calc Af Amer: >60 mL/min (ref >60)
GFR calc non Af Amer: >60 mL/min (ref >60)
Glucose, Bld: 147 mg/dL — ABNORMAL HIGH (ref 65–99)
Potassium: 3.4 mmol/L — ABNORMAL LOW (ref 3.5–5.1)
Sodium: 130 mmol/L — ABNORMAL LOW (ref 135–145)

## 2017-11-19 LAB — GLUCOSE, CAPILLARY
Glucose-Capillary: 134 mg/dL — ABNORMAL HIGH (ref 65–99)
Glucose-Capillary: 145 mg/dL — ABNORMAL HIGH (ref 65–99)
Glucose-Capillary: 190 mg/dL — ABNORMAL HIGH (ref 65–99)
Glucose-Capillary: 179 mg/dL — ABNORMAL HIGH (ref 65–99)
Glucose-Capillary: 185 mg/dL — ABNORMAL HIGH (ref 65–99)

## 2017-11-19 LAB — PROTIME-INR
INR: 2.14
Prothrombin Time: 23.8 seconds — ABNORMAL HIGH (ref 11.4–15.2)

## 2017-11-19 MED ORDER — WARFARIN SODIUM 1 MG PO TABS
1.0000 mg | ORAL_TABLET | Freq: Every day | ORAL | Status: DC
  Administered 2017-11-19: 1 mg via ORAL
  Filled 2017-11-19: qty 1

## 2017-11-19 MED ORDER — FUROSEMIDE 10 MG/ML IJ SOLN
40.0000 mg | Freq: Once | INTRAMUSCULAR | Status: AC
  Administered 2017-11-19: 40 mg via INTRAVENOUS
  Filled 2017-11-19: qty 4

## 2017-11-19 MED ORDER — POTASSIUM CHLORIDE CRYS ER 20 MEQ PO TBCR
40.0000 meq | EXTENDED_RELEASE_TABLET | Freq: Once | ORAL | Status: AC
  Administered 2017-11-19: 40 meq via ORAL
  Filled 2017-11-19: qty 2

## 2017-11-19 MED ORDER — POTASSIUM CHLORIDE CRYS ER 20 MEQ PO TBCR
30.0000 meq | EXTENDED_RELEASE_TABLET | Freq: Once | ORAL | Status: AC
  Administered 2017-11-19: 14:00:00 30 meq via ORAL
  Filled 2017-11-19: qty 1

## 2017-11-19 NOTE — Plan of Care (Signed)
Pt up and ambulating post surgery.

## 2017-11-19 NOTE — Progress Notes (Signed)
Patient ID: Denise Cuevas, female   DOB: Apr 16, 1951, 66 y.o.   MRN: [id number] [facility] Evening Rounds  Hemodynamically stable  sats 100%  Urine output ok

## 2017-11-19 NOTE — Progress Notes (Signed)
[facility] Gastroenterology Progress Note  Denise Cuevas 66 y.o. [birth year]  CC:  Abnormal LFTs, cirrhosis   Subjective: Patient doing fine from GI standpoint. Only complaining of constipation but had 1 bowel movement yesterday. Denied any blood in the stool. Remains alert and oriented 3  ROS : Positive for weakness and pain at incisional site   Objective: Vital signs in last 24 hours: Vitals:   11/19/17 0800 11/19/17 0900  BP: (!) 108/25 (!) 89/40  Pulse: 95 96  Resp: 20 (!) 21  Temp:    SpO2: 100% 98%    Physical Exam:  General:  Alert, cooperative, no distress, appears stated age  Head:  Normocephalic, without obvious abnormality, atraumatic  Eyes:  Scleral icterus   Lungs:   Decreased breath sounds bilaterally   Heart:  Regular rate and rhythm, S1, S2 normal  Abdomen:   Soft, non-tender, bowel sounds active all four quadrants,  no masses,   EXT  1- 2 + Edema        Lab Results: Recent Labs    11/18/17 0606 11/19/17 0355  NA 132* 130*  K 3.5 3.4*  CL 102 100*  CO2 24 21*  GLUCOSE 143* 147*  BUN 16 14  CREATININE 0.99 0.95  CALCIUM 7.7* 7.9*   Recent Labs    11/18/17 0606 11/19/17 0355  AST 66* 67*  ALT 47 43  ALKPHOS 117 126  BILITOT 4.8* 5.5*  PROT 4.6* 4.6*  ALBUMIN 2.2* 2.5*   Recent Labs    11/18/17 0606 11/19/17 0355  WBC 14.3* 16.8*  HGB 9.2* 9.1*  HCT 28.8* 28.1*  MCV 80.7 79.4  PLT 51* 51*   Recent Labs    11/18/17 0606 11/19/17 0355  LABPROT 18.7* 23.8*  INR 1.58 2.14      Assessment/Plan: - Elevated LFTs with bilirubin of 4.8, normal alkaline phosphatase, normal ALT and AST of 66 in patient with known cirrhosis. Patient has mild elevated LFTs since 2016 - Cirrhosis probably from [place]. 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  Currently on anticoagulation with Coumadin along with aspirin  - Small esophageal varices based on EGD in  September 2015 - Personal history of adenomatous polyp. Last colonoscopy in September 2015. - Chronic anemia - Thrombocytopenia  Recommendations ----------------------- - Mild increase in total bilirubin noted but AST remained stable. Normal ALT and alkaline phosphatase. - I'll check chronic hepatitis panel.  - Patient with known cirrhosis. Meld score 21 as of yesterday's blood work which could be artificially elevated because patient was on Coumadin. Ultrasound  showed no acute changes. Changes of cirrhosis again noted. - Elevated LFTs could be just from recent anesthesia medication could be mild decompensation of her underlying cirrhosis -  Patient is alert and oriented 3. She has no evidence of encephalopathy. No evidence of any active GI bleed. - Recheck LFTs and INR tomorrow. - GI will follow    Otis Brace MD, [place] 11/19/2017, 9:34 AM  Contact #  [phone]

## 2017-11-19 NOTE — Progress Notes (Addendum)
[facility] DAILY ICU PROGRESS NOTE                   [address]            [place] [address]          [phone]   7 Days Post-Op Procedure(s) (LRB): REDO STERNOTOMY (N/A) AORTIC VALVE REPLACEMENT (AVR)  USING 19 MM MAGNA EASE PERICARDIAL BIOPROSTHESIS-AORTIC, MODEL 3300TFX (N/A) MITRAL VALVE (MV) REPLACEMENT USING 25 MM MAGNA MITRAL EASE PERICARDIAL BIOPROSTHESIS, MODEL 7300TFX (N/A) TRANSESOPHAGEAL ECHOCARDIOGRAM (TEE) (N/A) ARTERIAL LINE INSERTION (Right)  Total Length of Stay:  LOS: 7 days   Subjective: Patient just finished using the bathroom. She had a bowel movement.  Objective: Vital signs in last 24 hours: Temp:  [97.7 F (36.5 C)-98.5 F (36.9 C)] 97.8 F (36.6 C) (12/12 0800) Pulse Rate:  [81-117] 96 (12/12 0900) Cardiac Rhythm: Atrial fibrillation (12/12 0900) Resp:  [12-28] 21 (12/12 0900) BP: (88-114)/(25-59) 89/40 (12/12 0900) SpO2:  [91 %-100 %] 98 % (12/12 0900) Weight:  [193 lb 6.4 oz (87.7 kg)] 193 lb 6.4 oz (87.7 kg) (12/12 0412)  Filed Weights   11/16/17 0300 11/18/17 0500 11/19/17 0412  Weight: 190 lb 14.4 oz (86.6 kg) 190 lb 14.7 oz (86.6 kg) 193 lb 6.4 oz (87.7 kg)    Weight change: 2 lb 7.7 oz (1.126 kg)      Intake/Output from previous day: 12/11 0701 - 12/12 0700 In: 1090 [P.O.:840; IV Piggyback:250] Out: 450 [Urine:450]  Intake/Output this shift: No intake/output data recorded.  Current Meds: Scheduled Meds: . amiodarone  200 mg Oral BID  . aspirin EC  81 mg Oral Daily   Or  . aspirin  81 mg Per Tube Daily  . atorvastatin  20 mg Oral q1800  . Chlorhexidine Gluconate Cloth  6 each Topical Daily  . DULoxetine  90 mg Oral Q lunch  . ferrous sulfate  325 mg Oral Q breakfast  . furosemide  40 mg Intravenous BID  . insulin aspart  0-24 Units Subcutaneous Q4H  . lactulose  20 g Oral BID  . levothyroxine  50 mcg Oral QAC breakfast  . mouth rinse  15 mL Mouth Rinse BID  . pantoprazole  40 mg Oral Daily  . patient's  guide to using coumadin book   Does not apply Once  . pneumococcal 23 valent vaccine  0.5 mL Intramuscular Tomorrow-1000  . sodium chloride flush  10-40 mL Intracatheter Q12H  . sodium chloride flush  3 mL Intravenous Q12H  . warfarin  2.5 mg Oral q1800  . Warfarin - Physician Dosing Inpatient   Does not apply q1800   Continuous Infusions: PRN Meds:.Teckla Christiansen's butt cream, magic mouthwash, mometasone-formoterol, morphine injection, ondansetron (ZOFRAN) IV, oxyCODONE, phenol, sodium chloride flush, sodium chloride flush, traMADol  General appearance: alert, cooperative and no distress Neurologic: intact Heart: RRR, flow murmur Lungs: Slightly diminished at bases bilaterally Abdomen: Soft, obese, non tender, bowel sounds present Extremities: ++ LE edema Wound: Slight bloody ooze after sternal dressing removed. No sign of infection.  Lab Results: CBC: Recent Labs    11/18/17 0606 11/19/17 0355  WBC 14.3* 16.8*  HGB 9.2* 9.1*  HCT 28.8* 28.1*  PLT 51* 51*   BMET:  Recent Labs    11/18/17 0606 11/19/17 0355  NA 132* 130*  K 3.5 3.4*  CL 102 100*  CO2 24 21*  GLUCOSE 143* 147*  BUN 16 14  CREATININE 0.99 0.95  CALCIUM 7.7* 7.9*  CMET: Lab Results  Component Value Date   WBC 16.8 (H) 11/19/2017   HGB 9.1 (L) 11/19/2017   HCT 28.1 (L) 11/19/2017   PLT 51 (L) 11/19/2017   GLUCOSE 147 (H) 11/19/2017   CHOL 121 10/18/2017   TRIG 61 10/18/2017   HDL 33 (L) 10/18/2017   LDLCALC 76 10/18/2017   ALT 43 11/19/2017   AST 67 (H) 11/19/2017   NA 130 (L) 11/19/2017   K 3.4 (L) 11/19/2017   CL 100 (L) 11/19/2017   CREATININE 0.95 11/19/2017   BUN 14 11/19/2017   CO2 21 (L) 11/19/2017   TSH 2.664 10/15/2017   INR 2.14 11/19/2017   HGBA1C 7.5 (H) 11/10/2017     PT/INR:  Recent Labs    11/19/17 0355  LABPROT 23.8*  INR 2.14   Radiology: US Abdomen Limited Ruq  Result Date: 11/18/2017 CLINICAL DATA:  Abnormal LFTs. EXAM: ULTRASOUND ABDOMEN LIMITED RIGHT UPPER  QUADRANT COMPARISON:  10/10/2017. FINDINGS: Gallbladder: Cholecystectomy. Common bile duct: Diameter: 4.4 mm Liver: Coarse echotexture with lobulated contour suggesting cirrhosis. No focal hepatic abnormality identified. Portal vein is patent on color Doppler imaging with questionable bidirectional blood flow. Mild ascites. Right pleural effusion noted. Prominent amount of bowel gas present. IMPRESSION: 1. Cholecystectomy.  No biliary distention. 2. The liver has a coarse echotexture with lobular contour suggesting cirrhosis. Question bidirectional portal venous flow also noted. 3. Mild ascites.  Right-sided pleural effusion. 4. Prominent amount of bowel gas. Electronically Signed   By: Marcello Moores  Register   On: 11/18/2017 15:25     Assessment/Plan: S/P Procedure(s) (LRB): REDO STERNOTOMY (N/A) AORTIC VALVE REPLACEMENT (AVR)  USING 19 MM MAGNA EASE PERICARDIAL BIOPROSTHESIS-AORTIC, MODEL 3300TFX (N/A) MITRAL VALVE (MV) REPLACEMENT USING 25 MM MAGNA MITRAL EASE PERICARDIAL BIOPROSTHESIS, MODEL 7300TFX (N/A) TRANSESOPHAGEAL ECHOCARDIOGRAM (TEE) (N/A) ARTERIAL LINE INSERTION (Right)  1. CV-Previous a fib. SR in the 90's this am.On Amiodarone 200 mg bid and Coumadin. INR 2.14 this am. Will decrease Coumadin dose. Per cardiology recommendations, Amiodarone for about 4 weeks. Because of history of cirrhosis, long term Amiodarone not recommended. 2. Pulmonary-On room air. Encourage incentive spirometer.  3. Volume overload-On Lasix 40 mg IV bid 4. Chronic anemia-H and stable at H 9.1 and 28.1. Continue ferrous sulfate.  5. DM-CBGs 145/134/145. On Insulin. Pre op HGA1C 7.5. She will need medical follow up after discharge.  6. Elevated ALT and bilirubin. AST 67 and total bilirubin increased to 5.5. 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 Await chronic hepatitis panel. Abdominal US done yesterday showed no biliary distention, liver with coarse texture (cirrhosis), mild ascites, right plerual  effusion. Appreciate GI's assistance. 7. Potassium being supplemented 8. Thrombocytopenia-platelets 51,000. Pre op platelet count 112,000. Await HIT results. 9. Hyponatremia-related to diuresis. Monitor 10. Will remove EPW once INR decreased  Donielle Liston Alba PA-C 11/19/2017 10:05 AM   Bili up to 5.5  From 4.8 yesterday does of Lipitor reduced but now will d/c  Sternum is stable , small amount serous drainage from lower pole No fever, wbc up to 16,000 Slow progress  Resume lasix , may need right thoracentesis monitor for now  I have seen and examined Laruth Bouchard and agree with the above assessment  and plan.  [name] MD [facility] [phone] Office [phone] 11/19/2017 12:31 PM

## 2017-11-19 NOTE — Discharge Instructions (Signed)
Aortic Valve Replacement, Care After °Refer to this sheet in the next few weeks. These instructions provide you with information about caring for yourself after your procedure. Your health care provider may also give you more specific instructions. Your treatment has been planned according to current medical practices, but problems sometimes occur. Call your health care provider if you have any problems or questions after your procedure. °What can I expect after the procedure? °After the procedure, it is common to have: °· Pain around your incision area. °· A small amount of blood or clear fluid coming from your incision. ° °Follow these instructions at home: °Eating and drinking ° °· Follow instructions from your health care provider about eating or drinking restrictions. °? Limit alcohol intake to no more than 1 drink per day for nonpregnant women and 2 drinks per day for men. One drink equals 12 oz of beer, 5 oz of wine, or 1½ oz of hard liquor. °? Limit how much caffeine you drink. Caffeine can affect your heart's rate and rhythm. °· Drink enough fluid to keep your urine clear or pale yellow. °· Eat a heart-healthy diet. This should include plenty of fresh fruits and vegetables. If you eat meat, it should be lean cuts. Avoid foods that are: °? High in salt, saturated fat, or sugar. °? Canned or highly processed. °? Fried. °Activity °· Return to your normal activities as told by your health care provider. Ask your health care provider what activities are safe for you. °· Exercise regularly once you have recovered, as told by your health care provider. °· Avoid sitting for more than 2 hours at a time without moving. Get up and move around at least once every 1-2 hours. This helps to prevent blood clots in the legs. °· Do not lift anything that is heavier than 10 lb (4.5 kg) until your health care provider approves. °· Avoid pushing or pulling things with your arms until your health care provider approves. This  includes pulling on handrails to help you climb stairs. °Incision care ° °· Follow instructions from your health care provider about how to take care of your incision. Make sure you: °? Wash your hands with soap and water before you change your bandage (dressing). If soap and water are not available, use hand sanitizer. °? Change your dressing as told by your health care provider. °? Leave stitches (sutures), skin glue, or adhesive strips in place. These skin closures may need to stay in place for 2 weeks or longer. If adhesive strip edges start to loosen and curl up, you may trim the loose edges. Do not remove adhesive strips completely unless your health care provider tells you to do that. °· Check your incision area every day for signs of infection. Check for: °? More redness, swelling, or pain. °? More fluid or blood. °? Warmth. °? Pus or a bad smell. °Medicines °· Take over-the-counter and prescription medicines only as told by your health care provider. °· If you were prescribed an antibiotic medicine, take it as told by your health care provider. Do not stop taking the antibiotic even if you start to feel better. °Travel °· Avoid airplane travel for as long as told by your health care provider. °· When you travel, bring a list of your medicines and a record of your medical history with you. Carry your medicines with you. °Driving °· Ask your health care provider when it is safe for you to drive. Do not drive until your health   care provider approves.  Do not drive or operate heavy machinery while taking prescription pain medicine. Lifestyle   Do not use any tobacco products, such as cigarettes, chewing tobacco, or e-cigarettes. If you need help quitting, ask your health care provider.  Resume sexual activity as told by your health care provider. Do not use medicines for erectile dysfunction unless your health care provider approves, if this applies.  Work with your health care provider to keep your  blood pressure and cholesterol under control, and to manage any other heart conditions that you have.  Maintain a healthy weight. General instructions  Do not take baths, swim, or use a hot tub until your health care provider approves.  Do not strain to have a bowel movement.  Avoid crossing your legs while sitting down.  Check your temperature every day for a fever. A fever may be a sign of infection.  If you are a woman and you plan to become pregnant, talk with your health care provider before you become pregnant.  Wear compression stockings if your health care provider instructs you to do this. These stockings help to prevent blood clots and reduce swelling in your legs.  Tell all health care providers who care for you that you have an artificial (prosthetic) aortic valve. If you have or have had heart disease or endocarditis, tell all health care providers about these conditions as well.  Keep all follow-up visits as told by your health care provider. This is important. Contact a health care provider if:  You develop a skin rash.  You experience sudden, unexplained changes in your weight.  You have more redness, swelling, or pain around your incision.  You have more fluid or blood coming from your incision.  Your incision feels warm to the touch.  You have pus or a bad smell coming from your incision.  You have a fever. Get help right away if:  You develop chest pain that is different from the pain coming from your incision.  You develop shortness of breath or difficulty breathing.  You start to feel light-headed. These symptoms may represent a serious problem that is an emergency. Do not wait to see if the symptoms will go away. Get medical help right away. Call your local emergency services (911 in the [place] Do not drive yourself to the hospital. This information is not intended to replace advice given to you by your health care provider. Make sure you discuss any  questions you have with your health care provider. Document Released: 06/13/2005 Document Revised: 05/02/2016 Document Reviewed: 10/29/2015 Elsevier Interactive Patient Education  2017 [employer]  Mitral Valve Replacement, Care After This sheet gives you information about how to care for yourself after your procedure. Your health care provider may also give you more specific instructions. If you have problems or questions, contact your health care provider. What can I expect after the procedure? After the procedure, it is common to have:  Pain at the incision area that may last for several weeks.  Follow these instructions at home: Incision care  Follow instructions from your health care provider about how to take care of your incision. Make sure you: ? Wash your hands with soap and water before you change your bandage (dressing). If soap and water are not available, use hand sanitizer. ? Change your dressing as told by your health care provider. ? Leave stitches (sutures), skin glue, or adhesive strips in place. These skin closures may need to stay in place  for 2 weeks or longer. If adhesive strip edges start to loosen and curl up, you may trim the loose edges. Do not remove adhesive strips completely unless your health care provider tells you to do that.  Check your incision area every day for signs of infection. Check for: ? More redness, swelling, or pain. ? More fluid or blood. ? Warmth. ? Pus or a bad smell.  Do not apply powder or lotion to the area. Driving  Do not drive until your health care provider approves.  Do not drive or use heavy machinery while taking prescription pain medicines. Bathing  Do not take baths, swim, or use a hot tub for 2-4 weeks after surgery, or until your health care provider approves. Ask your health care provider if you may take showers.  To wash the incision site, gently wash with soap and water and pat the area dry with a clean towel. Do  not rub the incision area. That may cause bleeding. Activity  Rest as told by your health care provider. Ask your health care provider when you can resume normal activities, including sexual activity.  Avoid the following activities for 6-8 weeks, or as long as directed: ? Lifting anything that is heavier than 10 lb (4.5 kg), or the limit that your health care provider tells you. ? Pushing or pulling things with your arms.  Avoid climbing stairs and using the handrail to pull yourself up for the first 2-3 weeks after surgery.  Avoid airplane travel for 4-6 weeks, or as long as directed.  Avoid sitting for long periods of time and crossing your legs. Get up and move around at least once every 1-2 hours.  If you are taking blood thinners (anticoagulants), avoid activities that have a high risk of injury. Ask your health care provider what activities are safe for you. Lifestyle  Limit alcohol intake to no more than 1 drink a day for nonpregnant women and 2 drinks a day for men. One drink equals 12 oz of beer, 5 oz of wine, or 1 oz of hard liquor.  Do not use any products that contain nicotine or tobacco, such as cigarettes and e-cigarettes. If you need help quitting, ask your health care provider. General instructions  Take your temperature every day and weigh yourself every morning for the first 7 days after surgery. Write your temperatures and weight down and take this record with you to any follow-up visits.  Take over-the-counter and prescription medicines only as told by your health care provider.  To prevent or treat constipation while you are taking prescription pain medicine, your health care provider may recommend that you: ? Drink enough fluid to keep your urine clear or pale yellow. ? Take over-the-counter or prescription medicines. ? Eat foods that are high in fiber, such as fresh fruits and vegetables, whole grains, and beans. ? Limit foods that are high in fat and processed  sugars, such as fried and sweet foods.  Follow instructions from your health care provider about eating or drinking restrictions.  Wear compression stockings for at least 2 weeks, or as long as told by your health care provider. These stockings help to prevent blood clots and reduce swelling in your legs. If your ankles are swollen after 2 weeks, continue to wear the stockings.  Keep all follow-up visits as told by your health care provider. This is important. Contact a health care provider if:  You develop a skin rash.  Your weight is increasing each day  over 2-3 days.  You gain 2 lb (1 kg) or more in a single day.  You have a fever. Get help right away if:  You develop chest pain that feels different from the pain caused by your incision.  You develop shortness of breath or difficulty breathing.  You have more redness, swelling, or pain around your incision.  You have more fluid or blood coming from your incision.  Your incision feels warm to the touch.  You have pus or a bad smell coming from your incision.  You feel light-headed. This information is not intended to replace advice given to you by your health care provider. Make sure you discuss any questions you have with your health care provider. Document Released: 06/14/2005 Document Revised: 09/06/2016 Document Reviewed: 09/06/2016 Elsevier Interactive Patient Education  2018 [place] on my medicine - Coumadin   (Warfarin)  This medication education was reviewed with me or my healthcare representative as part of my discharge preparation.  The pharmacist that spoke with me during my hospital stay was:  Georgina Peer, [facility]  Why was Coumadin prescribed for you? Coumadin was prescribed for you because you have a blood clot or a medical condition that can cause an increased risk of forming blood clots. Blood clots can cause serious health problems by blocking the flow of blood to the heart, lung, or  brain. Coumadin can prevent harmful blood clots from forming. As a reminder your indication for Coumadin is:   Stroke Prevention Because Of Atrial Fibrillation, recent valve surgery.   What test will check on my response to Coumadin? While on Coumadin (warfarin) you will need to have an INR test regularly to ensure that your dose is keeping you in the desired range. The INR (international normalized ratio) number is calculated from the result of the laboratory test called prothrombin time (PT).  If an INR APPOINTMENT HAS NOT ALREADY BEEN MADE FOR YOU please schedule an appointment to have this lab work done by your health care provider within 7 days. Your INR goal is usually a number between:  2 to 3 or your provider may give you a more narrow range like 2-2.5.  Ask your health care provider during an office visit what your goal INR is.  What  do you need to  know  About  COUMADIN? Take Coumadin (warfarin) exactly as prescribed by your healthcare provider about the same time each day.  DO NOT stop taking without talking to the doctor who prescribed the medication.  Stopping without other blood clot prevention medication to take the place of Coumadin may increase your risk of developing a new clot or stroke.  Get refills before you run out.  What do you do if you miss a dose? If you miss a dose, take it as soon as you remember on the same day then continue your regularly scheduled regimen the next day.  Do not take two doses of Coumadin at the same time.  Important Safety Information A possible side effect of Coumadin (Warfarin) is an increased risk of bleeding. You should call your healthcare provider right away if you experience any of the following: ? Bleeding from an injury or your nose that does not stop. ? Unusual colored urine (red or dark brown) or unusual colored stools (red or black). ? Unusual bruising for unknown reasons. ? A serious fall or if you hit your head (even if there is no  bleeding).  Some foods or medicines interact  with Coumadin (warfarin) and might alter your response to warfarin. To help avoid this: ? Eat a balanced diet, maintaining a consistent amount of Vitamin K. ? Notify your provider about major diet changes you plan to make. ? Avoid alcohol or limit your intake to 1 drink for women and 2 drinks for men per day. (1 drink is 5 oz. wine, 12 oz. beer, or 1.5 oz. liquor.)  Make sure that ANY health care provider who prescribes medication for you knows that you are taking Coumadin (warfarin).  Also make sure the healthcare provider who is monitoring your Coumadin knows when you have started a new medication including herbals and non-prescription products.  Coumadin (Warfarin)  Major Drug Interactions  Increased Warfarin Effect Decreased Warfarin Effect  Alcohol (large quantities) Antibiotics (esp. Septra/Bactrim, Flagyl, Cipro) Amiodarone (Cordarone) Aspirin (ASA) Cimetidine (Tagamet) Megestrol (Megace) NSAIDs (ibuprofen, naproxen, etc.) Piroxicam (Feldene) Propafenone (Rythmol SR) Propranolol (Inderal) Isoniazid (INH) Posaconazole (Noxafil) Barbiturates (Phenobarbital) Carbamazepine (Tegretol) Chlordiazepoxide (Librium) Cholestyramine (Questran) Griseofulvin Oral Contraceptives Rifampin Sucralfate (Carafate) Vitamin K   Coumadin (Warfarin) Major Herbal Interactions  Increased Warfarin Effect Decreased Warfarin Effect  Garlic Ginseng Ginkgo biloba Coenzyme Q10 Green tea St. Johns wort    Coumadin (Warfarin) FOOD Interactions  Eat a consistent number of servings per week of foods HIGH in Vitamin K (1 serving =  cup)  Collards (cooked, or boiled & drained) Kale (cooked, or boiled & drained) Mustard greens (cooked, or boiled & drained) Parsley *serving size only =  cup Spinach (cooked, or boiled & drained) Swiss chard (cooked, or boiled & drained) Turnip greens (cooked, or boiled & drained)  Eat a consistent number of  servings per week of foods MEDIUM-HIGH in Vitamin K (1 serving = 1 cup)  Asparagus (cooked, or boiled & drained) Broccoli (cooked, boiled & drained, or raw & chopped) Brussel sprouts (cooked, or boiled & drained) *serving size only =  cup Lettuce, raw (green leaf, endive, romaine) Spinach, raw Turnip greens, raw & chopped   These websites have more information on Coumadin (warfarin):  FailFactory.se; VeganReport.com.au;

## 2017-11-20 ENCOUNTER — Inpatient Hospital Stay (HOSPITAL_COMMUNITY): Payer: Medicare Other

## 2017-11-20 LAB — COMPREHENSIVE METABOLIC PANEL
ALK PHOS: 122 U/L (ref 38–126)
ALT: 38 U/L (ref 14–54)
AST: 62 U/L — ABNORMAL HIGH (ref 15–41)
Albumin: 2.2 g/dL — ABNORMAL LOW (ref 3.5–5.0)
Anion gap: 7 (ref 5–15)
BUN: 11 mg/dL (ref 6–20)
CHLORIDE: 100 mmol/L — AB (ref 101–111)
CO2: 23 mmol/L (ref 22–32)
CREATININE: 0.99 mg/dL (ref 0.44–1.00)
Calcium: 7.8 mg/dL — ABNORMAL LOW (ref 8.9–10.3)
GFR, EST NON AFRICAN AMERICAN: 59 mL/min — AB (ref >60)
Glucose, Bld: 159 mg/dL — ABNORMAL HIGH (ref 65–99)
POTASSIUM: 3.8 mmol/L (ref 3.5–5.1)
Sodium: 130 mmol/L — ABNORMAL LOW (ref 135–145)
TOTAL PROTEIN: 4.4 g/dL — AB (ref 6.5–8.1)
Total Bilirubin: 5.5 mg/dL — ABNORMAL HIGH (ref 0.3–1.2)

## 2017-11-20 LAB — CBC
HCT: 25.8 % — ABNORMAL LOW (ref 36.0–46.0)
Hemoglobin: 8.4 g/dL — ABNORMAL LOW (ref 12.0–15.0)
MCH: 26.1 pg (ref 26.0–34.0)
MCHC: 32.6 g/dL (ref 30.0–36.0)
MCV: 80.1 fL (ref 78.0–100.0)
Platelets: 49 10*3/uL — ABNORMAL LOW (ref 150–400)
RBC: 3.22 MIL/uL — ABNORMAL LOW (ref 3.87–5.11)
RDW: 24 % — ABNORMAL HIGH (ref 11.5–15.5)
WBC: 12.7 10*3/uL — ABNORMAL HIGH (ref 4.0–10.5)

## 2017-11-20 LAB — GLUCOSE, CAPILLARY
Glucose-Capillary: 137 mg/dL — ABNORMAL HIGH (ref 65–99)
Glucose-Capillary: 147 mg/dL — ABNORMAL HIGH (ref 65–99)
Glucose-Capillary: 155 mg/dL — ABNORMAL HIGH (ref 65–99)
Glucose-Capillary: 171 mg/dL — ABNORMAL HIGH (ref 65–99)
Glucose-Capillary: 192 mg/dL — ABNORMAL HIGH (ref 65–99)
Glucose-Capillary: 231 mg/dL — ABNORMAL HIGH (ref 65–99)

## 2017-11-20 LAB — HEPATITIS B SURFACE ANTIGEN: Hepatitis B Surface Ag: NEGATIVE

## 2017-11-20 LAB — PROTIME-INR
INR: 2.75
Prothrombin Time: 28.8 seconds — ABNORMAL HIGH (ref 11.4–15.2)

## 2017-11-20 LAB — HEPATITIS C ANTIBODY (REFLEX)

## 2017-11-20 LAB — HCV COMMENT:

## 2017-11-20 MED ORDER — GERHARDT'S BUTT CREAM
TOPICAL_CREAM | Freq: Two times a day (BID) | CUTANEOUS | Status: DC
  Administered 2017-11-20: 1 via TOPICAL
  Administered 2017-11-20 – 2017-11-21 (×2): via TOPICAL
  Filled 2017-11-20: qty 1

## 2017-11-20 NOTE — Care Management Note (Signed)
Case Management Note Marvetta Gibbons RN, BSN Unit 4E-Case Manager-- [place] coverage [phone]  Patient Details  Name: Denise Cuevas MRN: [id number] Date of Birth: July 29, 1951  Subjective/Objective:  Pt admitted s/p AVR/MVR with redo sternotomy - pt extubated off vent this am 12/6                Action/Plan: PTA PT lived at home with spouse- CM to follow for transition to home needs-   Expected Discharge Date:                  Expected Discharge Plan:  [place]  In-House Referral:     Discharge planning Services  CM Consult  Post Acute Care Choice:  Home Health Choice offered to:     DME Arranged:    DME Agency:     HH Arranged:    [place] Agency:     Status of Service:  In process, will continue to follow  If discussed at Long Length of Stay Meetings, dates discussed:  12/13  Discharge Disposition:   Additional Comments:  11/20/17- 1420- Taqwa Deem RN, CM- pt discussed in QC/LLOS mtg- per medical director- Dr. Loni Muse- pt has been placed in [facility] with [facility] for long ICU stay and s/p AVR/MVR with vol overload- CM will f/u with pt prior to discharge- will need [place] orders at discharge.    Dawayne Ceyda, RN 11/20/2017, 2:23 PM

## 2017-11-20 NOTE — Progress Notes (Signed)
Patient ID: Denise Cuevas, female   DOB: 1950/12/18, 66 y.o.   MRN: [id number] [facility] DAILY ICU PROGRESS NOTE                   [place]            [place] [id number]          [phone]   8 Days Post-Op Procedure(s) (LRB): REDO STERNOTOMY (N/A) AORTIC VALVE REPLACEMENT (AVR)  USING 19 MM MAGNA EASE PERICARDIAL BIOPROSTHESIS-AORTIC, MODEL 3300TFX (N/A) MITRAL VALVE (MV) REPLACEMENT USING 25 MM MAGNA MITRAL EASE PERICARDIAL BIOPROSTHESIS, MODEL 7300TFX (N/A) TRANSESOPHAGEAL ECHOCARDIOGRAM (TEE) (N/A) ARTERIAL LINE INSERTION (Right)  Total Length of Stay:  LOS: 8 days   Subjective: Patient up in chair, overall she notes that she feels better, left hand swelling has improved   Objective: Vital signs in last 24 hours: Temp:  [98 F (36.7 C)-98.5 F (36.9 C)] 98.4 F (36.9 C) (12/13 0400) Pulse Rate:  [81-100] 95 (12/12 1900) Cardiac Rhythm: Normal sinus rhythm (12/12 2000) Resp:  [0-26] 16 (12/13 0400) BP: (89-140)/(36-86) 104/51 (12/13 0400) SpO2:  [98 %-100 %] 100 % (12/13 0400)  Filed Weights   11/16/17 0300 11/18/17 0500 11/19/17 0412  Weight: 190 lb 14.4 oz (86.6 kg) 190 lb 14.7 oz (86.6 kg) 193 lb 6.4 oz (87.7 kg)    Weight change:    Hemodynamic parameters for last 24 hours:    Intake/Output from previous day: 12/12 0701 - 12/13 0700 In: 840 [P.O.:840] Out: 300 [Urine:300]  Intake/Output this shift: No intake/output data recorded.  Current Meds: Scheduled Meds: . amiodarone  200 mg Oral BID  . aspirin EC  81 mg Oral Daily   Or  . aspirin  81 mg Per Tube Daily  . Chlorhexidine Gluconate Cloth  6 each Topical Daily  . DULoxetine  90 mg Oral Q lunch  . ferrous sulfate  325 mg Oral Q breakfast  . furosemide  40 mg Intravenous BID  . insulin aspart  0-24 Units Subcutaneous Q4H  . lactulose  20 g Oral BID  . levothyroxine  50 mcg Oral QAC breakfast  . mouth rinse  15 mL Mouth Rinse BID  . pantoprazole  40 mg Oral Daily  . patient's  guide to using coumadin book   Does not apply Once  . pneumococcal 23 valent vaccine  0.5 mL Intramuscular Tomorrow-1000  . sodium chloride flush  10-40 mL Intracatheter Q12H  . sodium chloride flush  3 mL Intravenous Q12H  . warfarin  1 mg Oral q1800  . Warfarin - Physician Dosing Inpatient   Does not apply q1800   Continuous Infusions: PRN Meds:.Brentley Horrell's butt cream, magic mouthwash, mometasone-formoterol, morphine injection, ondansetron (ZOFRAN) IV, oxyCODONE, phenol, sodium chloride flush, sodium chloride flush, traMADol  General appearance: alert and cooperative Neurologic: intact Heart: regular rate and rhythm, S1, S2 normal, no murmur, click, rub or gallop Lungs: diminished breath sounds bibasilar Abdomen: soft, non-tender; bowel sounds normal; no masses,  no organomegaly Extremities: extremities normal, atraumatic, no cyanosis or edema and Homans sign is negative, no sign of DVT Wound: Sternum stable, still small amount of serous drainage from the lower incision  Lab Results: CBC: Recent Labs    11/19/17 0355 11/20/17 0527  WBC 16.8* 12.7*  HGB 9.1* 8.4*  HCT 28.1* 25.8*  PLT 51* 49*   BMET:  Recent Labs    11/19/17 0355 11/20/17 0527  NA 130* 130*  K 3.4* 3.8  CL 100* 100*  CO2 21* 23  GLUCOSE 147* 159*  BUN 14 11  CREATININE 0.95 0.99  CALCIUM 7.9* 7.8*    CMET: Lab Results  Component Value Date   WBC 12.7 (H) 11/20/2017   HGB 8.4 (L) 11/20/2017   HCT 25.8 (L) 11/20/2017   PLT 49 (L) 11/20/2017   GLUCOSE 159 (H) 11/20/2017   CHOL 121 10/18/2017   TRIG 61 10/18/2017   HDL 33 (L) 10/18/2017   LDLCALC 76 10/18/2017   ALT 38 11/20/2017   AST 62 (H) 11/20/2017   NA 130 (L) 11/20/2017   K 3.8 11/20/2017   CL 100 (L) 11/20/2017   CREATININE 0.99 11/20/2017   BUN 11 11/20/2017   CO2 23 11/20/2017   TSH 2.664 10/15/2017   INR 2.75 11/20/2017   HGBA1C 7.5 (H) 11/10/2017      PT/INR:  Recent Labs    11/20/17 0527  LABPROT 28.8*  INR 2.75    Radiology: No results found.   Assessment/Plan: S/P Procedure(s) (LRB): REDO STERNOTOMY (N/A) AORTIC VALVE REPLACEMENT (AVR)  USING 19 MM MAGNA EASE PERICARDIAL BIOPROSTHESIS-AORTIC, MODEL 3300TFX (N/A) MITRAL VALVE (MV) REPLACEMENT USING 25 MM MAGNA MITRAL EASE PERICARDIAL BIOPROSTHESIS, MODEL 7300TFX (N/A) TRANSESOPHAGEAL ECHOCARDIOGRAM (TEE) (N/A) ARTERIAL LINE INSERTION (Right) Hold Coumadin today, DC pacing wires when INR is decreased Bilirubin has stabilized 5.5 Renal function remains stable Continued thrombocytopenia HIT testing still pending Holding sinus rhythm White count has improved, now 12 felt   [name] 11/20/2017 8:01 AM

## 2017-11-20 NOTE — Progress Notes (Signed)
Physical Therapy Treatment Patient Details Name: Denise Cuevas MRN: [id number] DOB: 05/13/1951 Today's Date: 11/20/2017    History of Present Illness Pt adm on 12/5 for AVR and MVR. PMH - CABG, chf, idiopathic cirrhosis, htn, dm    PT Comments    Progressing steadily.  Emphasis on gait stability/stamina and negotiating stairs  Follow Up Recommendations  Home health PT;Supervision - Intermittent     Equipment Recommendations  3in1 (PT);Other (comment)    Recommendations for Other Services       Precautions / Restrictions Precautions Precautions: Sternal;Fall Restrictions Other Position/Activity Restrictions: sternal precautions    Mobility  Bed Mobility               General bed mobility comments: up on the toilet  Transfers Overall transfer level: Needs assistance Equipment used: Rolling walker (2 wheeled) Transfers: Sit to/from Stand Sit to Stand: Min guard            Ambulation/Gait Ambulation/Gait assistance: Min guard Ambulation Distance (Feet): 270 Feet Assistive device: Rolling walker (2 wheeled) Gait Pattern/deviations: Step-through pattern   Gait velocity interpretation: Below normal speed for age/gender General Gait Details: generally steady, good use of the rW, EHR generally in the 110-120's, not overt dyspnea, but back pain necessitating frequent standing rest breaks.   Stairs Stairs: Yes   Stair Management: Two rails;Step to pattern;Forwards Number of Stairs: 4 General stair comments: more use of the rail than would like with minimal assist  Wheelchair Mobility    Modified Rankin (Stroke Patients Only)       Balance Overall balance assessment: Needs assistance Sitting-balance support: Feet supported;No upper extremity supported Sitting balance-Leahy Scale: Fair     Standing balance support: No upper extremity supported Standing balance-Leahy Scale: Fair                              Cognition  Arousal/Alertness: Awake/alert Behavior During Therapy: WFL for tasks assessed/performed Overall Cognitive Status: Within Functional Limits for tasks assessed                                 General Comments: not tested formally      Exercises      General Comments General comments (skin integrity, edema, etc.): discussed sternal precautions as they relate to bed mobility      Pertinent Vitals/Pain Pain Assessment: Faces Faces Pain Scale: Hurts little more Pain Location: back Pain Descriptors / Indicators: Grimacing;Guarding;Discomfort Pain Intervention(s): Monitored during session    Home Living                      Prior Function            PT Goals (current goals can now be found in the care plan section) Acute Rehab PT Goals Patient Stated Goal: to get better  PT Goal Formulation: With patient Time For Goal Achievement: 12/01/17 Potential to Achieve Goals: Good Progress towards PT goals: Progressing toward goals    Frequency    Min 3X/week      PT Plan Current plan remains appropriate    Co-evaluation              AM-PAC PT "6 Clicks" Daily Activity  Outcome Measure  Difficulty turning over in bed (including adjusting bedclothes, sheets and blankets)?: Unable Difficulty moving from lying on back to sitting on the side of the  bed? : Unable Difficulty sitting down on and standing up from a chair with arms (e.g., wheelchair, bedside commode, etc,.)?: Unable Help needed moving to and from a bed to chair (including a wheelchair)?: A Little Help needed walking in hospital room?: A Little Help needed climbing 3-5 steps with a railing? : A Little 6 Click Score: 12    End of Session   Activity Tolerance: Patient tolerated treatment well Patient left: with call bell/phone within reach;with family/visitor present;in chair Nurse Communication: Mobility status;Other (comment) PT Visit Diagnosis: Unsteadiness on feet (R26.81);Muscle  weakness (generalized) (M62.81)     Time: 0375-4360 PT Time Calculation (min) (ACUTE ONLY): 31 min  Charges:  $Gait Training: 8-22 mins $Therapeutic Activity: 8-22 mins                    G Codes:       28-Nov-2017  Donnella Sham, PT [phone]  (pager)   Tessie Fass Romayne Ticas Nov 28, 2017, 5:29 PM

## 2017-11-20 NOTE — Progress Notes (Signed)
Occupational Therapy Treatment Patient Details Name: Denise Cuevas MRN: [id number] DOB: 12-22-1950 Today's Date: 11/20/2017    History of present illness Pt adm on 12/5 for AVR and MVR. PMH - CABG, chf, idiopathic cirrhosis, htn, dm   OT comments  Pt reports she had just gotten back to bed and is exhausted.  She deferred OOB activity at this time.  She reports Lt wrist pain improved as well as improving ROM.   Continues with limitations with composite finger/wrist extension which causes pain volar forearm.  Myofascial release and soft tissue mobs performed with passive and AAROM resulting in improved ROM and decreased pain.   She was provided with therapy ball and instructed in HEP.    Follow Up Recommendations  Home health OT;Supervision/Assistance - 24 hour    Equipment Recommendations  3 in 1 bedside commode    Recommendations for Other Services      Precautions / Restrictions Precautions Precautions: Sternal;Fall Restrictions Weight Bearing Restrictions: Yes Other Position/Activity Restrictions: sternal precautions       Mobility Bed Mobility                  Transfers                 General transfer comment: Pt deferred OOB due to fatigue - just back to bed     Balance               Standing balance comment: Pt deferred OOB due to fatigue and just back to bed                            ADL either performed or assessed with clinical judgement   ADL                                         General ADL Comments: Pt reports she is exhausted and just back to bed      Vision       Perception     Praxis      Cognition Arousal/Alertness: Awake/alert Behavior During Therapy: WFL for tasks assessed/performed Overall Cognitive Status: Impaired/Different from baseline                               Problem Solving: Slow processing;Requires verbal cues          Exercises Other Exercises Other  Exercises: Pt reports ROM and pain significantly better.  She demonstrates full isolated AAROM wrist flex/ext and finger flex/ext.  Composite extension limited to ~50% initially with pain on dorsum of forearm.  Myofascial release and edema massage performed followed by passive stretch.  Aterwards, she was able to perform ~75% of composite extension with pain 2/10.  She was provided with therapy ball and instructed to perform finger flexion/squeeze 10 reps 4-6x/day   Shoulder Instructions       General Comments      Pertinent Vitals/ Pain       Pain Assessment: 0-10 Pain Score: 2  Pain Location: lt wrist Pain Descriptors / Indicators: Grimacing;Guarding;Aching Pain Intervention(s): Monitored during session  Home Living  Prior Functioning/Environment              Frequency  Min 2X/week        Progress Toward Goals  OT Goals(current goals can now be found in the care plan section)  Progress towards OT goals: Progressing toward goals  ADL Goals Pt Will Perform Grooming: with supervision;standing Pt Will Perform Upper Body Bathing: with set-up;sitting Pt Will Perform Lower Body Bathing: with supervision;sit to/from stand Pt Will Perform Upper Body Dressing: with supervision;sitting Pt Will Perform Lower Body Dressing: with supervision;sit to/from stand Pt Will Transfer to Toilet: with supervision;ambulating;regular height toilet Pt Will Perform Toileting - Clothing Manipulation and hygiene: with supervision;sit to/from stand Pt/caregiver will Perform Home Exercise Program: Increased strength;Increased ROM;Right Upper extremity;Independently  Plan Discharge plan remains appropriate    Co-evaluation                 AM-PAC PT "6 Clicks" Daily Activity     Outcome Measure   Help from another person eating meals?: None Help from another person taking care of personal grooming?: A Little Help from another  person toileting, which includes using toliet, bedpan, or urinal?: A Little Help from another person bathing (including washing, rinsing, drying)?: A Lot Help from another person to put on and taking off regular upper body clothing?: A Lot Help from another person to put on and taking off regular lower body clothing?: A Lot 6 Click Score: 16    End of Session    OT Visit Diagnosis: Unsteadiness on feet (R26.81) Pain - Right/Left: Left Pain - part of body: Hand   Activity Tolerance Patient limited by fatigue;Patient limited by lethargy   Patient Left in bed;with call bell/phone within reach;with family/visitor present   Nurse Communication Mobility status        Time: 6967-8938 OT Time Calculation (min): 32 min  Charges: OT General Charges $OT Visit: 1 Visit OT Treatments $Therapeutic Exercise: 23-37 mins  [facility], OTR/L [phone]    Lucille Passy M 11/20/2017, 11:49 AM

## 2017-11-20 NOTE — Progress Notes (Addendum)
[facility] Gastroenterology Progress Note  Denise Cuevas 66 y.o. 11-15-1951  CC:  Abnormal LFTs, cirrhosis   Subjective: Patient denied any to GI issues. She is feeling better. Nausea is improving. Denied abdominal pain. Denied any black stool or bright blood per rectum  ROS : Positive for weakness and pain at incisional site   Objective: Vital signs in last 24 hours: Vitals:   11/20/17 0300 11/20/17 0400  BP:  (!) 104/51  Pulse:    Resp:  16  Temp: 98.2 F (36.8 C) 98.4 F (36.9 C)  SpO2:  100%    Physical Exam:  General:  Alert, cooperative, no distress, appears stated age  Head:  Normocephalic, without obvious abnormality, atraumatic  Eyes:  Scleral icterus   Lungs:   Decreased breath sounds bilaterally   Heart:  Regular rate and rhythm, S1, S2 normal  Abdomen:   Soft, non-tender, bowel sounds active all four quadrants,  no masses,   EXT  1- 2 + Edema        Lab Results: Recent Labs    11/19/17 0355 11/20/17 0527  NA 130* 130*  K 3.4* 3.8  CL 100* 100*  CO2 21* 23  GLUCOSE 147* 159*  BUN 14 11  CREATININE 0.95 0.99  CALCIUM 7.9* 7.8*   Recent Labs    11/19/17 0355 11/20/17 0527  AST 67* 62*  ALT 43 38  ALKPHOS 126 122  BILITOT 5.5* 5.5*  PROT 4.6* 4.4*  ALBUMIN 2.5* 2.2*   Recent Labs    11/19/17 0355 11/20/17 0527  WBC 16.8* 12.7*  HGB 9.1* 8.4*  HCT 28.1* 25.8*  MCV 79.4 80.1  PLT 51* 49*   Recent Labs    11/19/17 0355 11/20/17 0527  LABPROT 23.8* 28.8*  INR 2.14 2.75      Assessment/Plan: - Elevated LFTs with bilirubin of 4.8, normal alkaline phosphatase, normal ALT and AST of 66 in patient with known cirrhosis. Patient has mild elevated LFTs since 2016 - Cirrhosis probably from [place]. 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  Currently on anticoagulation with Coumadin along with aspirin  - Small esophageal varices based on EGD in  September 2015 - Personal history of adenomatous polyp. Last colonoscopy in September 2015. - Chronic anemia - Thrombocytopenia  Recommendations ----------------------- - LFTs stable. Hepatitis panel negative. Ultrasound showed no acute changes and  CBD of 4.4 mm. - No Further workup from GI standpoint at this time  - Elevated LFTs could be just from recent anesthesia medication or could be mild decompensation of her underlying cirrhosis -   Recommend periodically checking LFTs and call us back if there is significant increase in LFTs otherwise, she can follow-up with Dr. Paulita Fujita in 3-4 weeks after discharge. - If patient gets discharged, recommend repeating LFTs with primary care physician in 1 to 2 weeks.  -  GI will sign off. Call us back if needed    Otis Brace MD, [place] 11/20/2017, 7:47 AM  Contact #  [phone]

## 2017-11-20 NOTE — Progress Notes (Signed)
Occupational Therapy Evaluation (late entry)  Pt admitted with the below listed deficits.  She requires min - mod A for ADLs.  Soft tissue mobilization followed by PROM and AROM to Lt wrist and hand with improved ROM.   She lives with spouse and has very supportive family.   Recommend HHOT, and 3in1 commode at discharge.   11/19/17 1700  OT Visit Information  Last OT Received On 11/20/17  Assistance Needed +1  History of Present Illness Pt adm on 12/5 for AVR and MVR. PMH - CABG, chf, idiopathic cirrhosis, htn, dm  Precautions  Precautions Sternal;Fall  Restrictions  Other Position/Activity Restrictions sternal precautions  Home Living  Family/patient expects to be discharged to: Private residence  Living Arrangements Spouse/significant other;Children  Available Help at Discharge Family;Available 24 hours/day  Type of Home House  Home Access Stairs to enter  Entrance Stairs-Number of Steps 7  Entrance Stairs-Rails Right  Home Layout Multi-level;Able to live on main level with bedroom/bathroom  Bathroom Shower/Tub Tub/shower unit;Curtain  Tax adviser - 2 wheels  Prior Function  Level of Independence Independent  Comments Retired Pharmacist, hospital. Takes care of 2 grandchildren   Communication  Communication No difficulties  Pain Assessment  Pain Assessment 0-10  Pain Score 5  Pain Location lt wrist  Pain Descriptors / Indicators Grimacing;Guarding;Aching  Pain Intervention(s) Monitored during session  Cognition  Arousal/Alertness Awake/alert  Behavior During Therapy WFL for tasks assessed/performed  Overall Cognitive Status Impaired/Different from baseline  Area of Impairment Problem solving;Attention  Current Attention Level Selective  Problem Solving Slow processing;Requires verbal cues  Upper Extremity Assessment  Upper Extremity Assessment LUE deficits/detail  LUE Deficits / Details Pt with complaint of Lt wrist pain with flex/extension of  wrist and digits.  Bruising noted dorsum of hand, and fingertips.  dressing over A-line site.    LUE Coordination decreased fine motor  Lower Extremity Assessment  Lower Extremity Assessment Defer to PT evaluation  Cervical / Trunk Assessment  Cervical / Trunk Assessment Normal  ADL  Overall ADL's  Needs assistance/impaired  Eating/Feeding Independent  Grooming Wash/dry hands;Wash/dry face;Oral care;Brushing hair;Min guard;Standing  Upper Body Bathing Minimal assistance;Sitting  Lower Body Bathing Moderate assistance;Sit to/from stand  Upper Body Dressing  Moderate assistance;Sitting  Lower Body Dressing Maximal assistance;Sit to/from Retail buyer Ambulation;Comfort height toilet;RW;Minimal assistance  Toilet Transfer Details (indicate cue type and reason) assist to move sit to stand   Toileting- Forensic psychologist Min guard;Sit to/from stand  Functional mobility during ADLs Min guard;Rolling walker  Bed Mobility  General bed mobility comments up in chair   Transfers  Overall transfer level Needs assistance  Equipment used Rolling walker (2 wheeled)  Transfers Sit to/from [employer] Transfers  Sit to Stand Min assist  Stand pivot transfers Min guard  Balance  Overall balance assessment Needs assistance  Sitting-balance support Feet supported;No upper extremity supported  Sitting balance-Leahy Scale Fair  Standing balance support No upper extremity supported  Standing balance-Leahy Scale Fair  General Comments  General comments (skin integrity, edema, etc.) verbal cues for sternal precautions   Exercises  Exercises Other exercises  Other Exercises  Other Exercises soft tissue mobilization followed by passive stretch and AROM wrist and digits.  Pt demonstrates ~90% AROM of digits and ~60% AROM of wrist at end of session.  Pt instructed to perform exercises hourly   OT - End of Session  Equipment Utilized During Treatment Rolling walker  Activity  Tolerance Patient tolerated treatment  well  Patient left in chair;with call bell/phone within reach  Nurse Communication Mobility status  OT Assessment  OT Recommendation/Assessment Patient needs continued OT Services  OT Visit Diagnosis Unsteadiness on feet (R26.81);Pain  Pain - Right/Left Left  Pain - part of body Hand  OT Problem List Decreased strength;Decreased activity tolerance;Decreased range of motion;Impaired balance (sitting and/or standing);Decreased coordination;Decreased cognition;Decreased safety awareness;Decreased knowledge of use of DME or AE;Decreased knowledge of precautions;Cardiopulmonary status limiting activity;Pain;Impaired UE functional use  OT Plan  OT Frequency (ACUTE ONLY) Min 2X/week  OT Treatment/Interventions (ACUTE ONLY) Self-care/ADL training;Therapeutic exercise;DME and/or AE instruction;Therapeutic activities;Cognitive remediation/compensation;Patient/family education;Balance training  AM-PAC OT "6 Clicks" Daily Activity Outcome Measure  Help from another person eating meals? 4  Help from another person taking care of personal grooming? 3  Help from another person toileting, which includes using toliet, bedpan, or urinal? 3  Help from another person bathing (including washing, rinsing, drying)? 2  Help from another person to put on and taking off regular upper body clothing? 2  Help from another person to put on and taking off regular lower body clothing? 2  6 Click Score 16  ADL G Code Conversion CK  OT Recommendation  Follow Up Recommendations Home health OT;Supervision/Assistance - 24 hour  OT Equipment 3 in 1 bedside commode  Individuals Consulted  Consulted and Agree with Results and Recommendations Patient  Acute Rehab OT Goals  Patient Stated Goal to get better   OT Goal Formulation With patient  Time For Goal Achievement 12/04/17  Potential to Achieve Goals Good  OT Time Calculation  OT Start Time (ACUTE ONLY) 1426  OT Stop Time (ACUTE  ONLY) 1525  OT Time Calculation (min) 59 min  OT General Charges  $OT Visit 1 Visit  OT Evaluation  $OT Eval Moderate Complexity 1 Mod  OT Treatments  $Self Care/Home Management  23-37 mins  $Therapeutic Exercise 8-22 mins  [facility], OTR/L [phone]

## 2017-11-20 NOTE — Progress Notes (Signed)
[facility] BRIEF SICU PROGRESS NOTE  8 Days Post-Op  S/P Procedure(s) (LRB): REDO STERNOTOMY (N/A) AORTIC VALVE REPLACEMENT (AVR)  USING 19 MM MAGNA EASE PERICARDIAL BIOPROSTHESIS-AORTIC, MODEL 3300TFX (N/A) MITRAL VALVE (MV) REPLACEMENT USING 25 MM MAGNA MITRAL EASE PERICARDIAL BIOPROSTHESIS, MODEL 7300TFX (N/A) TRANSESOPHAGEAL ECHOCARDIOGRAM (TEE) (N/A) ARTERIAL LINE INSERTION (Right)   Stable day  Plan: Continue current plan  Rexene Alberts, MD 11/20/2017 8:06 PM

## 2017-11-20 NOTE — Progress Notes (Signed)
Noted patient in Afib rate 96-115. With BP 111/61.I will continue to monitor rate and rhythm closely

## 2017-11-21 ENCOUNTER — Inpatient Hospital Stay (HOSPITAL_COMMUNITY): Payer: Medicare Other

## 2017-11-21 LAB — GLUCOSE, CAPILLARY
Glucose-Capillary: 181 mg/dL — ABNORMAL HIGH (ref 65–99)
Glucose-Capillary: 167 mg/dL — ABNORMAL HIGH (ref 65–99)
Glucose-Capillary: 152 mg/dL — ABNORMAL HIGH (ref 65–99)
Glucose-Capillary: 153 mg/dL — ABNORMAL HIGH (ref 65–99)
Glucose-Capillary: 242 mg/dL — ABNORMAL HIGH (ref 65–99)
Glucose-Capillary: 232 mg/dL — ABNORMAL HIGH (ref 65–99)
Glucose-Capillary: 203 mg/dL — ABNORMAL HIGH (ref 65–99)

## 2017-11-21 LAB — CBC
HCT: 26.4 % — ABNORMAL LOW (ref 36.0–46.0)
Hemoglobin: 8.6 g/dL — ABNORMAL LOW (ref 12.0–15.0)
MCH: 26.4 pg (ref 26.0–34.0)
MCHC: 32.6 g/dL (ref 30.0–36.0)
MCV: 81 fL (ref 78.0–100.0)
Platelets: 57 10*3/uL — ABNORMAL LOW (ref 150–400)
RBC: 3.26 MIL/uL — ABNORMAL LOW (ref 3.87–5.11)
RDW: 24.9 % — ABNORMAL HIGH (ref 11.5–15.5)
WBC: 12.8 10*3/uL — ABNORMAL HIGH (ref 4.0–10.5)

## 2017-11-21 LAB — HEPATIC FUNCTION PANEL
ALT: 35 U/L (ref 14–54)
AST: 61 U/L — ABNORMAL HIGH (ref 15–41)
Albumin: 2.2 g/dL — ABNORMAL LOW (ref 3.5–5.0)
Alkaline Phosphatase: 142 U/L — ABNORMAL HIGH (ref 38–126)
Bilirubin, Direct: 2.4 mg/dL — ABNORMAL HIGH (ref 0.1–0.5)
Indirect Bilirubin: 2.9 mg/dL — ABNORMAL HIGH (ref 0.3–0.9)
Total Bilirubin: 5.3 mg/dL — ABNORMAL HIGH (ref 0.3–1.2)
Total Protein: 4.3 g/dL — ABNORMAL LOW (ref 6.5–8.1)

## 2017-11-21 LAB — BASIC METABOLIC PANEL
Anion gap: 8 (ref 5–15)
BUN: 10 mg/dL (ref 6–20)
CO2: 23 mmol/L (ref 22–32)
Calcium: 7.6 mg/dL — ABNORMAL LOW (ref 8.9–10.3)
Chloride: 98 mmol/L — ABNORMAL LOW (ref 101–111)
Creatinine, Ser: 1.08 mg/dL — ABNORMAL HIGH (ref 0.44–1.00)
GFR calc Af Amer: >60 mL/min (ref >60)
GFR calc non Af Amer: 53 mL/min — ABNORMAL LOW (ref >60)
Glucose, Bld: 160 mg/dL — ABNORMAL HIGH (ref 65–99)
Potassium: 3.3 mmol/L — ABNORMAL LOW (ref 3.5–5.1)
Sodium: 129 mmol/L — ABNORMAL LOW (ref 135–145)

## 2017-11-21 LAB — PROTIME-INR
INR: 2.54
Prothrombin Time: 27.1 seconds — ABNORMAL HIGH (ref 11.4–15.2)

## 2017-11-21 MED ORDER — POTASSIUM CHLORIDE CRYS ER 20 MEQ PO TBCR
20.0000 meq | EXTENDED_RELEASE_TABLET | Freq: Every day | ORAL | Status: DC
  Administered 2017-11-21 – 2017-11-26 (×6): 20 meq via ORAL
  Filled 2017-11-21 (×6): qty 1

## 2017-11-21 MED ORDER — ALBUMIN HUMAN 25 % IV SOLN
12.5000 g | Freq: Once | INTRAVENOUS | Status: AC
  Administered 2017-11-22: 12.5 g via INTRAVENOUS
  Filled 2017-11-21: qty 50

## 2017-11-21 MED ORDER — METOPROLOL TARTRATE 12.5 MG HALF TABLET
12.5000 mg | ORAL_TABLET | Freq: Two times a day (BID) | ORAL | Status: DC
  Administered 2017-11-21 – 2017-11-22 (×2): 12.5 mg via ORAL
  Filled 2017-11-21 (×4): qty 1

## 2017-11-21 MED ORDER — POTASSIUM CHLORIDE 10 MEQ/50ML IV SOLN
10.0000 meq | INTRAVENOUS | Status: DC
  Administered 2017-11-21 (×2): 10 meq via INTRAVENOUS
  Filled 2017-11-21 (×3): qty 50

## 2017-11-21 MED ORDER — MOVING RIGHT ALONG BOOK
Freq: Once | Status: AC
  Administered 2017-11-21: 11:00:00
  Filled 2017-11-21: qty 1

## 2017-11-21 MED ORDER — SPIRONOLACTONE 25 MG PO TABS
25.0000 mg | ORAL_TABLET | Freq: Every day | ORAL | Status: DC
  Administered 2017-11-22 – 2017-11-26 (×5): 25 mg via ORAL
  Filled 2017-11-21 (×5): qty 1

## 2017-11-21 MED ORDER — ONDANSETRON HCL 4 MG PO TABS: 4.0000 mg | ORAL_TABLET | Freq: Four times a day (QID) | ORAL | Status: DC | PRN

## 2017-11-21 MED ORDER — FUROSEMIDE 10 MG/ML IJ SOLN
40.0000 mg | Freq: Every day | INTRAMUSCULAR | Status: DC
  Administered 2017-11-22 – 2017-11-25 (×4): 40 mg via INTRAVENOUS
  Filled 2017-11-21 (×4): qty 4

## 2017-11-21 MED ORDER — SODIUM CHLORIDE 0.9% FLUSH: 3.0000 mL | INTRAVENOUS | Status: DC | PRN

## 2017-11-21 MED ORDER — PANTOPRAZOLE SODIUM 40 MG PO TBEC
40.0000 mg | DELAYED_RELEASE_TABLET | Freq: Every day | ORAL | Status: DC
  Administered 2017-11-22 – 2017-11-26 (×5): 40 mg via ORAL
  Filled 2017-11-21 (×5): qty 1

## 2017-11-21 MED ORDER — FUROSEMIDE 40 MG PO TABS: 40.0000 mg | ORAL_TABLET | Freq: Every day | ORAL | Status: DC

## 2017-11-21 MED ORDER — OXYCODONE HCL 5 MG PO TABS: 5.0000 mg | ORAL_TABLET | ORAL | Status: DC | PRN

## 2017-11-21 MED ORDER — INSULIN ASPART 100 UNIT/ML ~~LOC~~ SOLN
0.0000 [IU] | Freq: Three times a day (TID) | SUBCUTANEOUS | Status: DC
  Administered 2017-11-21 (×3): 8 [IU] via SUBCUTANEOUS
  Administered 2017-11-22: 4 [IU] via SUBCUTANEOUS
  Administered 2017-11-22 (×2): 8 [IU] via SUBCUTANEOUS
  Administered 2017-11-22: 4 [IU] via SUBCUTANEOUS
  Administered 2017-11-23: 2 [IU] via SUBCUTANEOUS
  Administered 2017-11-23: 12 [IU] via SUBCUTANEOUS
  Administered 2017-11-23: 2 [IU] via SUBCUTANEOUS
  Administered 2017-11-24: 8 [IU] via SUBCUTANEOUS
  Administered 2017-11-24: 2 [IU] via SUBCUTANEOUS
  Administered 2017-11-24: 4 [IU] via SUBCUTANEOUS
  Administered 2017-11-24 – 2017-11-25 (×2): 8 [IU] via SUBCUTANEOUS
  Administered 2017-11-25: 4 [IU] via SUBCUTANEOUS

## 2017-11-21 MED ORDER — TRAMADOL HCL 50 MG PO TABS
50.0000 mg | ORAL_TABLET | Freq: Four times a day (QID) | ORAL | Status: DC | PRN
  Administered 2017-11-25 (×2): 50 mg via ORAL
  Filled 2017-11-21 (×2): qty 1

## 2017-11-21 MED ORDER — ONDANSETRON HCL 4 MG/2ML IJ SOLN
4.0000 mg | Freq: Four times a day (QID) | INTRAMUSCULAR | Status: DC | PRN
  Administered 2017-11-23: 4 mg via INTRAVENOUS
  Filled 2017-11-21: qty 2

## 2017-11-21 MED ORDER — BISACODYL 5 MG PO TBEC: 10.0000 mg | DELAYED_RELEASE_TABLET | Freq: Every day | ORAL | Status: DC | PRN

## 2017-11-21 MED ORDER — SODIUM CHLORIDE 0.9 % IV SOLN: 250.0000 mL | INTRAVENOUS | Status: DC | PRN

## 2017-11-21 MED ORDER — SODIUM CHLORIDE 0.9% FLUSH
3.0000 mL | Freq: Two times a day (BID) | INTRAVENOUS | Status: DC
  Administered 2017-11-25: 3 mL via INTRAVENOUS

## 2017-11-21 MED ORDER — BISACODYL 10 MG RE SUPP: 10.0000 mg | Freq: Every day | RECTAL | Status: DC | PRN

## 2017-11-21 NOTE — Progress Notes (Signed)
Physical Therapy Treatment Patient Details Name: Denise Cuevas MRN: [id number] DOB: August 12, 1951 Today's Date: 11/21/2017    History of Present Illness Pt is a 66 y.o. female admitted on 12/5 for AVR and MVR. PMH - CABG, chf, idiopathic cirrhosis, htn, dm.   PT Comments    Pt progressing well with mobility. Continues to have difficulty maintaining sternal precautions with bed mobility, increased time spent practicing this with use of heart pillow. Transfers and ambulates well with RW, only requiring supervision for safety. Encouraged to amb with nursing and/or family supervision. Will continue to follow acutely to maximize functional mobility and independence prior to d/c home.    Follow Up Recommendations  Home health PT;Supervision - Intermittent     Equipment Recommendations  3in1 (PT)    Recommendations for Other Services       Precautions / Restrictions Precautions Precautions: Sternal;Fall Restrictions Other Position/Activity Restrictions: sternal precautions    Mobility  Bed Mobility Overal bed mobility: Needs Assistance Bed Mobility: Rolling;Sidelying to Sit;Sit to Sidelying Rolling: Min guard Sidelying to sit: Min guard     Sit to sidelying: Min assist General bed mobility comments: MinA to assist BLEs back into bed. Pt needed repeated cues to maintain sternal precautions; recommended use of heart pillow to hug for bed mobility   Transfers Overall transfer level: Needs assistance Equipment used: Rolling walker (2 wheeled) Transfers: Sit to/from Stand Sit to Stand: Supervision         General transfer comment: Good technique standing with hands on knees, reliant on momentum; able to stand from bed and toilet. Supervision for safety  Ambulation/Gait Ambulation/Gait assistance: Supervision Ambulation Distance (Feet): 400 Feet Assistive device: Rolling walker (2 wheeled) Gait Pattern/deviations: Step-through pattern;Decreased stride length Gait velocity:  Decreased Gait velocity interpretation: <1.8 ft/sec, indicative of risk for recurrent falls General Gait Details: Slow, controlled amb with RW. Only required 1x standing rest break   Stairs Stairs: Yes   Stair Management: One rail Right   General stair comments: Simulated stairs with BUE support on handrail and high knee marching (has R-side rail at home). Pt with good technique, not overly reliant on BUE support. Supervision for safety  Wheelchair Mobility    Modified Rankin (Stroke Patients Only)       Balance Overall balance assessment: Needs assistance Sitting-balance support: Feet supported;No upper extremity supported Sitting balance-Leahy Scale: Good     Standing balance support: No upper extremity supported Standing balance-Leahy Scale: Fair Standing balance comment: Able to static stand and wash hands at sink with no UE support                            Cognition Arousal/Alertness: Awake/alert Behavior During Therapy: WFL for tasks assessed/performed Overall Cognitive Status: Within Functional Limits for tasks assessed                                        Exercises      General Comments General comments (skin integrity, edema, etc.): Difficulty maintaining sternal precautions with bed mob      Pertinent Vitals/Pain Pain Assessment: No/denies pain    Home Living                      Prior Function            PT Goals (current goals can now be found  in the care plan section) Acute Rehab PT Goals Patient Stated Goal: to get better  PT Goal Formulation: With patient Time For Goal Achievement: 12/01/17 Potential to Achieve Goals: Good Progress towards PT goals: Progressing toward goals    Frequency    Min 3X/week      PT Plan Current plan remains appropriate    Co-evaluation              AM-PAC PT "6 Clicks" Daily Activity  Outcome Measure  Difficulty turning over in bed (including adjusting  bedclothes, sheets and blankets)?: A Little Difficulty moving from lying on back to sitting on the side of the bed? : Unable Difficulty sitting down on and standing up from a chair with arms (e.g., wheelchair, bedside commode, etc,.)?: A Little Help needed moving to and from a bed to chair (including a wheelchair)?: A Little Help needed walking in hospital room?: A Little Help needed climbing 3-5 steps with a railing? : A Little 6 Click Score: 16    End of Session Equipment Utilized During Treatment: Gait belt Activity Tolerance: Patient tolerated treatment well Patient left: in bed;with call bell/phone within reach;with family/visitor present Nurse Communication: Mobility status;Other (comment)(supervision to amb with family) PT Visit Diagnosis: Unsteadiness on feet (R26.81);Muscle weakness (generalized) (M62.81)     Time: 9702-6378 PT Time Calculation (min) (ACUTE ONLY): 26 min  Charges:  $Gait Training: 8-22 mins $Therapeutic Activity: 8-22 mins                    G Codes:      Mabeline Caras, PT, DPT Acute Rehab Services  Pager: [place] 11/21/2017, 2:31 PM

## 2017-11-21 NOTE — Progress Notes (Signed)
Patient ID: Denise Cuevas, female   DOB: 16-Nov-1951, 66 y.o.   MRN: [id number] TCTS DAILY ICU PROGRESS NOTE                   [address]            [place] [address]          [phone]   9 Days Post-Op Procedure(s) (LRB): REDO STERNOTOMY (N/A) AORTIC VALVE REPLACEMENT (AVR)  USING 19 MM MAGNA EASE PERICARDIAL BIOPROSTHESIS-AORTIC, MODEL 3300TFX (N/A) MITRAL VALVE (MV) REPLACEMENT USING 25 MM MAGNA MITRAL EASE PERICARDIAL BIOPROSTHESIS, MODEL 7300TFX (N/A) TRANSESOPHAGEAL ECHOCARDIOGRAM (TEE) (N/A) ARTERIAL LINE INSERTION (Right)  Total Length of Stay:  LOS: 9 days   Subjective: Patient feels better up in chair this morning  Objective: Vital signs in last 24 hours: Temp:  [98.2 F (36.8 C)-98.9 F (37.2 C)] 98.6 F (37 C) (12/14 0807) Pulse Rate:  [84-113] 88 (12/14 0807) Cardiac Rhythm: Atrial fibrillation (12/14 0800) Resp:  [0-29] 17 (12/14 0807) BP: (97-117)/(44-94) 117/94 (12/14 0807) SpO2:  [97 %-100 %] 100 % (12/14 0807) Weight:  [188 lb 4.4 oz (85.4 kg)-193 lb 5.5 oz (87.7 kg)] 188 lb 4.4 oz (85.4 kg) (12/14 0600)  Filed Weights   11/19/17 0412 11/20/17 2000 11/21/17 0600  Weight: 193 lb 6.4 oz (87.7 kg) 193 lb 5.5 oz (87.7 kg) 188 lb 4.4 oz (85.4 kg)    Weight change:    Hemodynamic parameters for last 24 hours:    Intake/Output from previous day: 12/13 0701 - 12/14 0700 In: 970 [P.O.:960; I.V.:10] Out: 1100 [Urine:1100]  Intake/Output this shift: No intake/output data recorded.  Current Meds: Scheduled Meds: . amiodarone  200 mg Oral BID  . aspirin EC  81 mg Oral Daily   Or  . aspirin  81 mg Per Tube Daily  . Chlorhexidine Gluconate Cloth  6 each Topical Daily  . DULoxetine  90 mg Oral Q lunch  . ferrous sulfate  325 mg Oral Q breakfast  . furosemide  40 mg Intravenous BID  . Aleane Wesenberg's butt cream   Topical BID  . insulin aspart  0-24 Units Subcutaneous Q4H  . lactulose  20 g Oral BID  . levothyroxine  50 mcg Oral QAC  breakfast  . mouth rinse  15 mL Mouth Rinse BID  . pantoprazole  40 mg Oral Daily  . patient's guide to using coumadin book   Does not apply Once  . pneumococcal 23 valent vaccine  0.5 mL Intramuscular Tomorrow-1000  . sodium chloride flush  10-40 mL Intracatheter Q12H  . sodium chloride flush  3 mL Intravenous Q12H  . Warfarin - Physician Dosing Inpatient   Does not apply q1800   Continuous Infusions: PRN Meds:.Zenon Leaf's butt cream, magic mouthwash, mometasone-formoterol, morphine injection, ondansetron (ZOFRAN) IV, oxyCODONE, phenol, sodium chloride flush, sodium chloride flush, traMADol  General appearance: alert, cooperative and no distress Neurologic: intact Heart: regular rate and rhythm, S1, S2 normal, no murmur, click, rub or gallop Lungs: diminished breath sounds bibasilar Abdomen: soft, non-tender; bowel sounds normal; no masses,  no organomegaly Extremities: extremities normal, atraumatic, no cyanosis or edema and Homans sign is negative, no sign of DVT Wound: Sternum is stable, small serous drainage from lower incision  Lab Results: CBC: Recent Labs    11/20/17 0527 11/21/17 0430  WBC 12.7* 12.8*  HGB 8.4* 8.6*  HCT 25.8* 26.4*  PLT 49* 57*   BMET:  Recent Labs    11/20/17 0527 11/21/17 0430  NA 130* 129*  K 3.8 3.3*  CL 100* 98*  CO2 23 23  GLUCOSE 159* 160*  BUN 11 10  CREATININE 0.99 1.08*  CALCIUM 7.8* 7.6*    CMET: Lab Results  Component Value Date   WBC 12.8 (H) 11/21/2017   HGB 8.6 (L) 11/21/2017   HCT 26.4 (L) 11/21/2017   PLT 57 (L) 11/21/2017   GLUCOSE 160 (H) 11/21/2017   CHOL 121 10/18/2017   TRIG 61 10/18/2017   HDL 33 (L) 10/18/2017   LDLCALC 76 10/18/2017   ALT 35 11/21/2017   AST 61 (H) 11/21/2017   NA 129 (L) 11/21/2017   K 3.3 (L) 11/21/2017   CL 98 (L) 11/21/2017   CREATININE 1.08 (H) 11/21/2017   BUN 10 11/21/2017   CO2 23 11/21/2017   TSH 2.664 10/15/2017   INR 2.54 11/21/2017   HGBA1C 7.5 (H) 11/10/2017       PT/INR:  Recent Labs    11/21/17 0430  LABPROT 27.1*  INR 2.54   Radiology: Dg Chest Port 1 View  Result Date: 11/21/2017 CLINICAL DATA:  Encounter for chest tube placement. EXAM: PORTABLE CHEST 1 VIEW COMPARISON:  11/20/2017. FINDINGS: Right PICC line in unchanged position with its tip over the right atrium. Prior cardiac valve replacements. Heart size stable. Interim improvement of aeration of both lungs with clearing of interstitial prominence suggesting clearing interstitial edema. Mild right base atelectasis. No pneumothorax. No chest tube identified. IMPRESSION: 1. Right PICC line in unchanged position with tip over the right atrium. 2. Prior cardiac valve replacements. Heart size stable. Interim renal pulmonary interstitial prominence suggesting clearing pulmonary interstitial edema. Mild right base atelectasis. Electronically Signed   By: Marcello Moores  Register   On: 11/21/2017 07:11     Assessment/Plan: S/P Procedure(s) (LRB): REDO STERNOTOMY (N/A) AORTIC VALVE REPLACEMENT (AVR)  USING 19 MM MAGNA EASE PERICARDIAL BIOPROSTHESIS-AORTIC, MODEL 3300TFX (N/A) MITRAL VALVE (MV) REPLACEMENT USING 25 MM MAGNA MITRAL EASE PERICARDIAL BIOPROSTHESIS, MODEL 7300TFX (N/A) TRANSESOPHAGEAL ECHOCARDIOGRAM (TEE) (N/A) ARTERIAL LINE INSERTION (Right) Mobilize Diuresis Plan for transfer to step-down: see transfer orders Hold Coumadin today, possible DC pacing wires tomorrow depending on INR Renal function is stable Bilirubin has decreased slightly  Denise Cuevas 11/21/2017 9:30 AM

## 2017-11-22 LAB — CBC
HCT: 26.2 % — ABNORMAL LOW (ref 36.0–46.0)
Hemoglobin: 8.5 g/dL — ABNORMAL LOW (ref 12.0–15.0)
MCH: 26.6 pg (ref 26.0–34.0)
MCHC: 32.4 g/dL (ref 30.0–36.0)
MCV: 81.9 fL (ref 78.0–100.0)
Platelets: 57 10*3/uL — ABNORMAL LOW (ref 150–400)
RBC: 3.2 MIL/uL — ABNORMAL LOW (ref 3.87–5.11)
RDW: 25.1 % — ABNORMAL HIGH (ref 11.5–15.5)
WBC: 11.3 10*3/uL — ABNORMAL HIGH (ref 4.0–10.5)

## 2017-11-22 LAB — GLUCOSE, CAPILLARY
Glucose-Capillary: 187 mg/dL — ABNORMAL HIGH (ref 65–99)
Glucose-Capillary: 248 mg/dL — ABNORMAL HIGH (ref 65–99)
Glucose-Capillary: 231 mg/dL — ABNORMAL HIGH (ref 65–99)

## 2017-11-22 LAB — COMPREHENSIVE METABOLIC PANEL
ALT: 34 U/L (ref 14–54)
AST: 56 U/L — ABNORMAL HIGH (ref 15–41)
Albumin: 2.1 g/dL — ABNORMAL LOW (ref 3.5–5.0)
Alkaline Phosphatase: 139 U/L — ABNORMAL HIGH (ref 38–126)
Anion gap: 6 (ref 5–15)
BUN: 9 mg/dL (ref 6–20)
CO2: 24 mmol/L (ref 22–32)
Calcium: 7.7 mg/dL — ABNORMAL LOW (ref 8.9–10.3)
Chloride: 99 mmol/L — ABNORMAL LOW (ref 101–111)
Creatinine, Ser: 0.98 mg/dL (ref 0.44–1.00)
GFR calc Af Amer: >60 mL/min (ref >60)
GFR calc non Af Amer: 60 mL/min — ABNORMAL LOW (ref >60)
Glucose, Bld: 216 mg/dL — ABNORMAL HIGH (ref 65–99)
Potassium: 3.7 mmol/L (ref 3.5–5.1)
Sodium: 129 mmol/L — ABNORMAL LOW (ref 135–145)
Total Bilirubin: 4.6 mg/dL — ABNORMAL HIGH (ref 0.3–1.2)
Total Protein: 4.4 g/dL — ABNORMAL LOW (ref 6.5–8.1)

## 2017-11-22 LAB — PROTIME-INR
INR: 2.03
Prothrombin Time: 22.8 seconds — ABNORMAL HIGH (ref 11.4–15.2)

## 2017-11-22 MED ORDER — INSULIN DETEMIR 100 UNIT/ML ~~LOC~~ SOLN
12.0000 [IU] | Freq: Two times a day (BID) | SUBCUTANEOUS | Status: DC
  Administered 2017-11-22: 12 [IU] via SUBCUTANEOUS
  Filled 2017-11-22 (×4): qty 0.12

## 2017-11-22 MED ORDER — SODIUM CHLORIDE 0.9% FLUSH
10.0000 mL | INTRAVENOUS | Status: DC | PRN
  Administered 2017-11-25: 20 mL
  Filled 2017-11-22: qty 40

## 2017-11-22 MED ORDER — POTASSIUM CHLORIDE CRYS ER 20 MEQ PO TBCR
30.0000 meq | EXTENDED_RELEASE_TABLET | Freq: Once | ORAL | Status: AC
  Administered 2017-11-22: 30 meq via ORAL
  Filled 2017-11-22: qty 1

## 2017-11-22 MED ORDER — WARFARIN SODIUM 2 MG PO TABS: 2.0000 mg | ORAL_TABLET | Freq: Once | ORAL | Status: DC

## 2017-11-22 NOTE — Progress Notes (Addendum)
      [address]       [place] [address]             [phone]        10 Days Post-Op Procedure(s) (LRB): REDO STERNOTOMY (N/A) AORTIC VALVE REPLACEMENT (AVR)  USING 19 MM MAGNA EASE PERICARDIAL BIOPROSTHESIS-AORTIC, MODEL 3300TFX (N/A) MITRAL VALVE (MV) REPLACEMENT USING 25 MM MAGNA MITRAL EASE PERICARDIAL BIOPROSTHESIS, MODEL 7300TFX (N/A) TRANSESOPHAGEAL ECHOCARDIOGRAM (TEE) (N/A) ARTERIAL LINE INSERTION (Right)  Subjective: Patient states she did not sleep much. She is sleepy this am.   Objective: Vital signs in last 24 hours: Temp:  [96.7 F (35.9 C)-98.6 F (37 C)] 98.1 F (36.7 C) (12/15 0948) Pulse Rate:  [76-98] 82 (12/15 0948) Cardiac Rhythm: Normal sinus rhythm (12/15 0946) Resp:  [6-25] 18 (12/15 0948) BP: (91-112)/(35-70) 109/48 (12/15 0948) SpO2:  [94 %-100 %] 97 % (12/15 0948) Weight:  [187 lb 9.8 oz (85.1 kg)] 187 lb 9.8 oz (85.1 kg) (12/15 0600)  Pre op weight 74.5 kg Current Weight  11/22/17 187 lb 9.8 oz (85.1 kg)      Intake/Output from previous day: 12/14 0701 - 12/15 0700 In: 260 [P.O.:240; I.V.:20] Out: -    Physical Exam:  Cardiovascular: RRR, flow murmur Pulmonary: Diminished at bases Abdomen: Soft, non tender, bowel sounds present. Extremities: Pitting bilateral lower extremity edema. Wounds: Clean and dry.  No erythema or signs of infection. Neurologic: Grossly intact without focal deficits  Lab Results: CBC: Recent Labs    11/21/17 0430 11/22/17 0508  WBC 12.8* 11.3*  HGB 8.6* 8.5*  HCT 26.4* 26.2*  PLT 57* 57*   BMET:  Recent Labs    11/21/17 0430 11/22/17 0508  NA 129* 129*  K 3.3* 3.7  CL 98* 99*  CO2 23 24  GLUCOSE 160* 216*  BUN 10 9  CREATININE 1.08* 0.98  CALCIUM 7.6* 7.7*    PT/INR:  Lab Results  Component Value Date   INR 2.03 11/22/2017   INR 2.54 11/21/2017   INR 2.75 11/20/2017   ABG:  INR: Will add last result for INR, ABG once components are confirmed Will add last  4 CBG results once components are confirmed  Assessment/Plan:  1. CV - SR in the 80's this am. On Amiodarone 200 mg bid, Lopressor 12.5 mg bid, Spironolactone 25 mg daily, and Coumadin. INR down to 2.03 as Coumadin held last few days. 2.  Pulmonary - On room air. Will order a PA/LAT for am. Encourage incentive spirometer. 3. Volume Overload - On Lasix 40 mg IV daily 4.  Acute blood loss anemia - H and H stable at 8.5 and 26.2. Continue Ferrous sulfate. 5. Thrombocytopenia-platelets slightly increased to 57,000 this am. HIT negative. 6. Hyponatremia-sodium 129. Likely related to diuresis. 7. Supplement potassium 8. DM-CBGs 232/203/187. On Insulin PRN but will schedule for better glucose control. Will restart Jardiance at discharge. Pre op HGA1C 7.5. She will need medical follow up after discharge. 9. Elevated transaminases, bilirubin but starting to decrease-has history of cirrhosis. Check labs am. 10.As discussed with Dr. Harlin Heys, will remove EPW as INR decreased   Denise M ZimmermanPA-C 11/22/2017,10:14 AM   patient examined and medical record reviewed,agree with above note. Denise Cuevas 11/23/2017

## 2017-11-22 NOTE — Progress Notes (Signed)
CARDIAC REHAB PHASE I   PRE:  Rate/Rhythm: SR  Heading to bathroom  BP:  Supine:   Sitting:  Standing:   SaO2:   MODE:  Ambulation: 200 ft   POST:  Rate/Rhythm: SR98  BP:  Supine:   Sitting: 100/34  Standing:    SaO2:   Pt in the process of heading to bathroom. Assisted pt who appears pale to bathroom for BM/void.  Pt to bathroom sink to wash hands.  Pt ambulated 200 feet in hallway x 1 assist and RW.  Pt felt weak and fatigued.  Pt asked to return to room.  Pt to chair with feet elevated.  Post walk bp low will let primary nurse know.  Pt anticipates going home on Monday. Husband and daughter at the bedside, call bell within reach. No needs identified. Cherre Huger, BSN Cardiac and Training and development officer

## 2017-11-23 ENCOUNTER — Inpatient Hospital Stay (HOSPITAL_COMMUNITY): Payer: Medicare Other

## 2017-11-23 LAB — GLUCOSE, CAPILLARY
Glucose-Capillary: 119 mg/dL — ABNORMAL HIGH (ref 65–99)
Glucose-Capillary: 280 mg/dL — ABNORMAL HIGH (ref 65–99)
Glucose-Capillary: 146 mg/dL — ABNORMAL HIGH (ref 65–99)
Glucose-Capillary: 147 mg/dL — ABNORMAL HIGH (ref 65–99)

## 2017-11-23 LAB — COMPREHENSIVE METABOLIC PANEL
ALBUMIN: 2.3 g/dL — AB (ref 3.5–5.0)
ALK PHOS: 143 U/L — AB (ref 38–126)
ALT: 35 U/L (ref 14–54)
AST: 67 U/L — ABNORMAL HIGH (ref 15–41)
Anion gap: 7 (ref 5–15)
BUN: 9 mg/dL (ref 6–20)
CALCIUM: 7.9 mg/dL — AB (ref 8.9–10.3)
CHLORIDE: 100 mmol/L — AB (ref 101–111)
CO2: 24 mmol/L (ref 22–32)
CREATININE: 0.94 mg/dL (ref 0.44–1.00)
GFR calc Af Amer: >60 mL/min (ref >60)
GFR calc non Af Amer: >60 mL/min (ref >60)
GLUCOSE: 125 mg/dL — AB (ref 65–99)
Potassium: 3.7 mmol/L (ref 3.5–5.1)
SODIUM: 131 mmol/L — AB (ref 135–145)
Total Bilirubin: 4.1 mg/dL — ABNORMAL HIGH (ref 0.3–1.2)
Total Protein: 4.6 g/dL — ABNORMAL LOW (ref 6.5–8.1)

## 2017-11-23 LAB — PROTIME-INR
INR: 1.81
Prothrombin Time: 20.8 seconds — ABNORMAL HIGH (ref 11.4–15.2)

## 2017-11-23 MED ORDER — POTASSIUM CHLORIDE CRYS ER 20 MEQ PO TBCR
40.0000 meq | EXTENDED_RELEASE_TABLET | Freq: Once | ORAL | Status: AC
  Administered 2017-11-23: 40 meq via ORAL
  Filled 2017-11-23: qty 2

## 2017-11-23 MED ORDER — WARFARIN SODIUM 2.5 MG PO TABS
2.5000 mg | ORAL_TABLET | Freq: Once | ORAL | Status: AC
  Administered 2017-11-23: 2.5 mg via ORAL
  Filled 2017-11-23: qty 1

## 2017-11-23 MED ORDER — INSULIN DETEMIR 100 UNIT/ML ~~LOC~~ SOLN
16.0000 [IU] | Freq: Two times a day (BID) | SUBCUTANEOUS | Status: DC
  Administered 2017-11-23 – 2017-11-26 (×7): 16 [IU] via SUBCUTANEOUS
  Filled 2017-11-23 (×7): qty 0.16

## 2017-11-23 NOTE — Progress Notes (Addendum)
      [address]       [place] [address]             [phone]        11 Days Post-Op Procedure(s) (LRB): REDO STERNOTOMY (N/A) AORTIC VALVE REPLACEMENT (AVR)  USING 19 MM MAGNA EASE PERICARDIAL BIOPROSTHESIS-AORTIC, MODEL 3300TFX (N/A) MITRAL VALVE (MV) REPLACEMENT USING 25 MM MAGNA MITRAL EASE PERICARDIAL BIOPROSTHESIS, MODEL 7300TFX (N/A) TRANSESOPHAGEAL ECHOCARDIOGRAM (TEE) (N/A) ARTERIAL LINE INSERTION (Right)  Subjective: Patient sitting in chair eating breakfast. Denise Cuevas states Denise Cuevas slept better last night. No specific complaints this am.  Objective: Vital signs in last 24 hours: Temp:  [97.4 F (36.3 C)-98.2 F (36.8 C)] 97.4 F (36.3 C) (12/16 0424) Pulse Rate:  [77-85] 79 (12/16 0500) Cardiac Rhythm: Normal sinus rhythm (12/16 0700) Resp:  [15-20] 17 (12/16 0500) BP: (90-109)/(38-85) 90/38 (12/16 0424) SpO2:  [97 %-100 %] 98 % (12/16 0500) Weight:  [187 lb 14.4 oz (85.2 kg)] 187 lb 14.4 oz (85.2 kg) (12/16 0500)  Pre op weight 74.5 kg Current Weight  11/23/17 187 lb 14.4 oz (85.2 kg)      Intake/Output from previous day: 12/15 0701 - 12/16 0700 In: 240 [P.O.:240] Out: -    Physical Exam: Cardiovascular: RRR, flow murmur Pulmonary: Slightly diminished at bases Abdomen: Soft, non tender, bowel sounds present. Extremities: Pitting bilateral lower extremity edema. Wounds: RLE wounds are clean and dry. Distal sternal wound with superficial skin separation. No sign of infection.     Lab Results: CBC: Recent Labs    11/21/17 0430 11/22/17 0508  WBC 12.8* 11.3*  HGB 8.6* 8.5*  HCT 26.4* 26.2*  PLT 57* 57*   BMET:  Recent Labs    11/22/17 0508 11/23/17 0335  NA 129* 131*  K 3.7 3.7  CL 99* 100*  CO2 24 24  GLUCOSE 216* 125*  BUN 9 9  CREATININE 0.98 0.94  CALCIUM 7.7* 7.9*    PT/INR:  Lab Results  Component Value Date   INR 1.81 11/23/2017   INR 2.03 11/22/2017   INR 2.54 11/21/2017   ABG:  INR: Will add last  result for INR, ABG once components are confirmed Will add last 4 CBG results once components are confirmed  Assessment/Plan:  1. CV - SR in the 80's this am. On Amiodarone 200 mg bid, Lopressor 12.5 mg bid, Spironolactone 25 mg daily, and Coumadin. INR decreased to 1.81 as Coumadin held last few days in order to remove EPW. Will give Coumadin 2.5 mg tonight. SBP mostly in the 90's (asymtomatic) of late and not receiving both doses of Lopressor do will stop Lopressor for now. 2.  Pulmonary - On room air.  PA/LAT ordered but not taken yet. Encourage incentive spirometer. 3. Volume Overload - On Lasix 40 mg IV daily 4.  Acute blood loss anemia - H and H stable at 8.5 and 26.2. Continue Ferrous sulfate. 5. Thrombocytopenia-platelets slightly increased to 57,000 this am. HIT negative. 6. Hyponatremia-sodium slightly increased to 131. Likely related to diuresis. 7. Supplement potassium 8. DM-CBGs 248/231/119. On Insulin but will increase for better glucose control. Will restart Jardiance at discharge. Pre op HGA1C 7.5. Denise Cuevas will need medical follow up after discharge. 9. Elevated AST 67, total bilirubin 4.1. Total bilirubin slowly decreasing-has history of cirrhosis. Not on a statin   Donielle M ZimmermanPA-C 11/23/2017,7:52 AM  cont with diuresis- lasix, spiro patient examined and medical record reviewed,agree with above note. Tharon Aquas Trigt III 11/23/2017

## 2017-11-24 LAB — CBC
HEMATOCRIT: 26.8 % — AB (ref 36.0–46.0)
HEMOGLOBIN: 8.7 g/dL — AB (ref 12.0–15.0)
MCH: 27.4 pg (ref 26.0–34.0)
MCHC: 32.5 g/dL (ref 30.0–36.0)
MCV: 84.3 fL (ref 78.0–100.0)
Platelets: 63 10*3/uL — ABNORMAL LOW (ref 150–400)
RBC: 3.18 MIL/uL — ABNORMAL LOW (ref 3.87–5.11)
RDW: 26.8 % — ABNORMAL HIGH (ref 11.5–15.5)
WBC: 7.2 10*3/uL (ref 4.0–10.5)

## 2017-11-24 LAB — GLUCOSE, CAPILLARY
Glucose-Capillary: 182 mg/dL — ABNORMAL HIGH (ref 65–99)
Glucose-Capillary: 205 mg/dL — ABNORMAL HIGH (ref 65–99)
Glucose-Capillary: 207 mg/dL — ABNORMAL HIGH (ref 65–99)
Glucose-Capillary: 149 mg/dL — ABNORMAL HIGH (ref 65–99)

## 2017-11-24 LAB — PROTIME-INR
INR: 1.64
Prothrombin Time: 19.2 seconds — ABNORMAL HIGH (ref 11.4–15.2)

## 2017-11-24 MED ORDER — WARFARIN SODIUM 2.5 MG PO TABS
2.5000 mg | ORAL_TABLET | Freq: Every day | ORAL | Status: DC
  Administered 2017-11-24: 2.5 mg via ORAL
  Filled 2017-11-24: qty 1

## 2017-11-24 MED ORDER — POTASSIUM CHLORIDE CRYS ER 20 MEQ PO TBCR: 20.0000 meq | EXTENDED_RELEASE_TABLET | Freq: Every day | ORAL | 3 refills | Status: DC

## 2017-11-24 MED ORDER — TRAMADOL HCL 50 MG PO TABS: 50.0000 mg | ORAL_TABLET | Freq: Four times a day (QID) | ORAL | 0 refills | Status: DC | PRN

## 2017-11-24 MED ORDER — WARFARIN SODIUM 2.5 MG PO TABS: 2.5000 mg | ORAL_TABLET | Freq: Every day | ORAL | 3 refills | Status: DC

## 2017-11-24 MED ORDER — AMIODARONE HCL 200 MG PO TABS: 200.0000 mg | ORAL_TABLET | Freq: Every day | ORAL | 1 refills | Status: DC

## 2017-11-24 NOTE — Progress Notes (Signed)
Physical Therapy Treatment Patient Details Name: Denise Cuevas MRN: [id number] DOB: [birth year] Today's Date: 11/24/2017    History of Present Illness Pt is a 66 y.o. female admitted on 12/5 for AVR and MVR. PMH - CABG, chf, idiopathic cirrhosis, htn, dm.    PT Comments    Pt progressing towards physical therapy goals. Occasional assist provided throughout session for mobility while maintaining sternal precautions. Pt able to make corrective changes well with cues. Initiated HEP and gave handout for LE strengthening. Will continue to follow and progress as able per POC.    Follow Up Recommendations  Home health PT;Supervision - Intermittent     Equipment Recommendations  3in1 (PT)    Recommendations for Other Services       Precautions / Restrictions Precautions Precautions: Sternal;Fall Restrictions Weight Bearing Restrictions: Yes(Sternal) Other Position/Activity Restrictions: sternal precautions    Mobility  Bed Mobility Overal bed mobility: Needs Assistance Bed Mobility: Rolling;Sidelying to Sit Rolling: Supervision Sidelying to sit: Supervision       General bed mobility comments: Pt was able to transition to EOB holding heart pillow and maintaining sternal precautions well.   Transfers Overall transfer level: Needs assistance Equipment used: Rolling walker (2 wheeled) Transfers: Sit to/from Stand Sit to Stand: Min assist;Min guard         General transfer comment: VC's for maintenance of sternal precautions, and use of heart pillow for sternal support. Several attempts made to stand, however required min assist to achieve full standing.   Ambulation/Gait Ambulation/Gait assistance: Supervision Ambulation Distance (Feet): 300 Feet Assistive device: Rolling walker (2 wheeled) Gait Pattern/deviations: Step-through pattern;Decreased stride length Gait velocity: Decreased Gait velocity interpretation: <1.8 ft/sec, indicative of risk for recurrent  falls General Gait Details: Slow, controlled amb with RW. Only required 1x standing rest break   Stairs Stairs: Yes   Stair Management: One rail Right      Wheelchair Mobility    Modified Rankin (Stroke Patients Only)       Balance Overall balance assessment: Needs assistance Sitting-balance support: Feet supported;No upper extremity supported Sitting balance-Leahy Scale: Good Sitting balance - Comments: UE support   Standing balance support: No upper extremity supported Standing balance-Leahy Scale: Fair Standing balance comment: Able to static stand and wash hands at sink with no UE support                            Cognition Arousal/Alertness: Awake/alert Behavior During Therapy: WFL for tasks assessed/performed Overall Cognitive Status: Within Functional Limits for tasks assessed                                        Exercises General Exercises - Lower Extremity Ankle Circles/Pumps: 10 reps Quad Sets: 10 reps Long Arc Quad: 10 reps Hip ABduction/ADduction: 10 reps(and isometric adduction) Heel Raises: 10 reps;Seated    General Comments        Pertinent Vitals/Pain Pain Assessment: Faces Faces Pain Scale: No hurt Pain Intervention(s): Monitored during session    Home Living                      Prior Function            PT Goals (current goals can now be found in the care plan section) Acute Rehab PT Goals Patient Stated Goal: to get better; home tomorrow PT Goal  Formulation: With patient Time For Goal Achievement: 12/01/17 Potential to Achieve Goals: Good Progress towards PT goals: Progressing toward goals    Frequency    Min 3X/week      PT Plan Current plan remains appropriate    Co-evaluation              AM-PAC PT "6 Clicks" Daily Activity  Outcome Measure  Difficulty turning over in bed (including adjusting bedclothes, sheets and blankets)?: A Little Difficulty moving from lying on  back to sitting on the side of the bed? : Unable Difficulty sitting down on and standing up from a chair with arms (e.g., wheelchair, bedside commode, etc,.)?: A Little Help needed moving to and from a bed to chair (including a wheelchair)?: A Little Help needed walking in hospital room?: A Little Help needed climbing 3-5 steps with a railing? : A Little 6 Click Score: 16    End of Session Equipment Utilized During Treatment: Gait belt Activity Tolerance: Patient tolerated treatment well Patient left: in bed;with call bell/phone within reach;with family/visitor present Nurse Communication: Mobility status PT Visit Diagnosis: Unsteadiness on feet (R26.81);Muscle weakness (generalized) (M62.81)     Time: 3662-9476 PT Time Calculation (min) (ACUTE ONLY): 38 min  Charges:  $Gait Training: 23-37 mins $Therapeutic Exercise: 8-22 mins                    G Codes:       Rolinda Roan, PT, DPT Acute Rehabilitation Services Pager: [place] 11/24/2017, 1:44 PM

## 2017-11-24 NOTE — Patient Instructions (Signed)
Strengthening: Hip Adduction - Isometric    With ball or folded pillow between knees, squeeze knees together. Hold ____ seconds. Repeat ____ times per set. Do ____ sets per session. Do ____ sessions per day.  http://orth.exer.us/612   Copyright  VHI. All rights reserved.

## 2017-11-24 NOTE — Progress Notes (Signed)
CARDIAC REHAB PHASE I   PRE:  Rate/Rhythm: 93 SR  BP:  Supine:   Sitting: 110/32  Standing:    SaO2: 96%RA  MODE:  Ambulation: 320 ft   POST:  Rate/Rhythm: 96 SR  BP:  Supine:   Sitting: 121/65  Standing:    SaO2: 100%RA  0830-0930 Pt walked 320 ft on RA with rolling walker and minimal asst. Stopped twice to rest due to SOB., tolerated well. Has rolling walker at home for use. To bed after walk. Education completed with pt and her sister who voiced understanding.  Dicussed watching sodium and carb counting and watching dark green leafy vegetables. She has seen the pharmacist her sister stated. Wrote down how to view the Coumadin and discharge video when system up. Reviewed IS, sternal precautions, ex ed and CRP 2. Pt prefers to come here for CRP 2 so will refer to [place]. Attended here before.   Graylon Good, RN BSN  11/24/2017 9:24 AM

## 2017-11-24 NOTE — Progress Notes (Addendum)
      [address]       [place] [address]             [phone]      12 Days Post-Op Procedure(s) (LRB): REDO STERNOTOMY (N/A) AORTIC VALVE REPLACEMENT (AVR)  USING 19 MM MAGNA EASE PERICARDIAL BIOPROSTHESIS-AORTIC, MODEL 3300TFX (N/A) MITRAL VALVE (MV) REPLACEMENT USING 25 MM MAGNA MITRAL EASE PERICARDIAL BIOPROSTHESIS, MODEL 7300TFX (N/A) TRANSESOPHAGEAL ECHOCARDIOGRAM (TEE) (N/A) ARTERIAL LINE INSERTION (Right)   Subjective:  No new complaints.  Remains edematous.  Denies pain and shortness of breath.  Hoping to go home today.  Objective: Vital signs in last 24 hours: Temp:  [97.7 F (36.5 C)-98.4 F (36.9 C)] 98.4 F (36.9 C) (12/17 0500) Pulse Rate:  [81-86] 85 (12/17 0500) Cardiac Rhythm: Normal sinus rhythm (12/17 0700) Resp:  [15-20] 15 (12/17 0500) BP: (95-116)/(42-87) 100/43 (12/17 0500) SpO2:  [99 %-100 %] 99 % (12/17 0500) Weight:  [185 lb (83.9 kg)] 185 lb (83.9 kg) (12/17 0500)  Intake/Output from previous day: 12/16 0701 - 12/17 0700 In: 365 [P.O.:365] Out: -   General appearance: alert, cooperative and no distress Heart: regular rate and rhythm Lungs: clear to auscultation bilaterally Abdomen: soft, non-tender; bowel sounds normal; no masses,  no organomegaly Extremities: edema 1-2+ pitting Wound: clean and dyr  Lab Results: Recent Labs    11/22/17 0508 11/24/17 0500  WBC 11.3* 7.2  HGB 8.5* 8.7*  HCT 26.2* 26.8*  PLT 57* 63*   BMET:  Recent Labs    11/22/17 0508 11/23/17 0335  NA 129* 131*  K 3.7 3.7  CL 99* 100*  CO2 24 24  GLUCOSE 216* 125*  BUN 9 9  CREATININE 0.98 0.94  CALCIUM 7.7* 7.9*    PT/INR:  Recent Labs    11/24/17 0500  LABPROT 19.2*  INR 1.64   ABG    Component Value Date/Time   PHART 7.415 11/13/2017 0953   HCO3 22.8 11/13/2017 0953   TCO2 23 11/13/2017 1520   ACIDBASEDEF 1.0 11/13/2017 0953   O2SAT 67.5 11/15/2017 0339   CBG (last 3)  Recent Labs    11/23/17 1639 11/23/17 2106  11/24/17 0609  GLUCAP 146* 147* 182*    Assessment/Plan: S/P Procedure(s) (LRB): REDO STERNOTOMY (N/A) AORTIC VALVE REPLACEMENT (AVR)  USING 19 MM MAGNA EASE PERICARDIAL BIOPROSTHESIS-AORTIC, MODEL 3300TFX (N/A) MITRAL VALVE (MV) REPLACEMENT USING 25 MM MAGNA MITRAL EASE PERICARDIAL BIOPROSTHESIS, MODEL 7300TFX (N/A) TRANSESOPHAGEAL ECHOCARDIOGRAM (TEE) (N/A) ARTERIAL LINE INSERTION (Right)  1. CV- NSR, BP remains labile- continue Amiodarone 2. Pulm-pleural effusion on right, patient off oxygen, denies shortness of breath 3. INR 1.64, continue coumadin, will repeat dose at 2.5 mg tonight 4. Renal- remains edematous on exam, weight is trending down,  Continue Lasix, Spiro 5. Thrombocytopenia- improving, Plt up to 63 6. Dispo- patient stable, wants to go home today, will order TED hose, continue coumadin, diuretics, will defer to Dr. Servando Snare on possible discharge home today   LOS: 12 days    Ellwood Handler 11/24/2017  Still with edema , improving Will wait on d/c until edema controlled and inr therapeutic  Holding sinus for now plts improving , HIT negative

## 2017-11-24 NOTE — Progress Notes (Signed)
Occupational Therapy Treatment Patient Details Name: Denise Cuevas MRN: [id number] DOB: 11/07/1951 Today's Date: 11/24/2017    History of present illness Pt is a 66 y.o. female admitted on 12/5 for AVR and MVR. PMH - CABG, chf, idiopathic cirrhosis, htn, dm.   OT comments  Pt demonstrating progress toward OT goals this session. She was able to complete LB dressing tasks with adaptive equipment and min assist this session. Educated pt on safe use of AE with sternal precautions and she verbalized understanding. Pt additionally able to complete functional mobility in room and hallway for simulated IADL with min guard assist this session and RW. D/C recommendation remains appropriate. Will continue to follow while admitted.    Follow Up Recommendations  Home health OT;Supervision/Assistance - 24 hour    Equipment Recommendations  3 in 1 bedside commode    Recommendations for Other Services      Precautions / Restrictions Precautions Precautions: Sternal;Fall Restrictions Weight Bearing Restrictions: Yes(sternal precautions) Other Position/Activity Restrictions: sternal precautions       Mobility Bed Mobility Overal bed mobility: Needs Assistance Bed Mobility: Rolling;Sidelying to Sit Rolling: Supervision Sidelying to sit: Supervision       General bed mobility comments: VC's for safety and sternal precautions.   Transfers Overall transfer level: Needs assistance Equipment used: Rolling walker (2 wheeled) Transfers: Sit to/from Stand Sit to Stand: Min assist;Min guard Stand pivot transfers: Min guard       General transfer comment: unsuccessful on first attempt to stand but able to complete with min guard assist and rocking for momentum on second attempt.     Balance Overall balance assessment: Needs assistance Sitting-balance support: Feet supported;No upper extremity supported Sitting balance-Leahy Scale: Good     Standing balance support: No upper extremity  supported Standing balance-Leahy Scale: Fair Standing balance comment: Able to statically stand without UE support. Relies on UE support during mobility.                            ADL either performed or assessed with clinical judgement   ADL Overall ADL's : Needs assistance/impaired                     Lower Body Dressing: Minimal assistance;Sit to/from stand;With adaptive equipment Lower Body Dressing Details (indicate cue type and reason): Educated pt on gentle use of  AE for LB ADL.  Toilet Transfer: Min guard;Ambulation;RW Toilet Transfer Details (indicate cue type and reason): Simulated with sit<>stand followed by functional mobility in room.          Functional mobility during ADLs: Min guard;Rolling walker General ADL Comments: Pt able to complete functional mobility in hallway with min guard assist and 2 therapeutic rest breaks.     Vision       Perception     Praxis      Cognition Arousal/Alertness: Awake/alert Behavior During Therapy: WFL for tasks assessed/performed Overall Cognitive Status: Within Functional Limits for tasks assessed                                          Exercises     Shoulder Instructions       General Comments      Pertinent Vitals/ Pain       Pain Assessment: Faces Faces Pain Scale: Hurts a little bit Pain Location: incision site  Pain Descriptors / Indicators: Tightness;Guarding Pain Intervention(s): Monitored during session  Home Living                                          Prior Functioning/Environment              Frequency  Min 2X/week        Progress Toward Goals  OT Goals(current goals can now be found in the care plan section)  Progress towards OT goals: Progressing toward goals  Acute Rehab OT Goals Patient Stated Goal: to get better; home Wednesday OT Goal Formulation: With patient Time For Goal Achievement: 12/04/17 Potential to Achieve  Goals: Good  Plan Discharge plan remains appropriate    Co-evaluation                 AM-PAC PT "6 Clicks" Daily Activity     Outcome Measure   Help from another person eating meals?: None Help from another person taking care of personal grooming?: A Little Help from another person toileting, which includes using toliet, bedpan, or urinal?: A Little Help from another person bathing (including washing, rinsing, drying)?: A Lot Help from another person to put on and taking off regular upper body clothing?: A Little Help from another person to put on and taking off regular lower body clothing?: A Little 6 Click Score: 18    End of Session Equipment Utilized During Treatment: Rolling walker  OT Visit Diagnosis: Unsteadiness on feet (R26.81) Pain - Right/Left: (incision) Pain - part of body: (incision)   Activity Tolerance Patient limited by fatigue;Patient limited by lethargy   Patient Left in bed;with call bell/phone within reach;with family/visitor present(sitting at EOB for eating dessert)   Nurse Communication Mobility status        Time: 1610-9604 OT Time Calculation (min): 27 min  Charges: OT General Charges $OT Visit: 1 Visit OT Treatments $Self Care/Home Management : 23-37 mins  Norman Herrlich, MS OTR/L  Pager: Prichard A Boden Stucky 11/24/2017, 5:46 PM

## 2017-11-24 NOTE — Care Management Important Message (Signed)
Important Message  Patient Details  Name: Denise Cuevas MRN: [id number] Date of Birth: 15-Feb-1951   Medicare Important Message Given:  Yes    Nathen May 11/24/2017, 10:35 AM

## 2017-11-25 LAB — PROTIME-INR
INR: 2.11
Prothrombin Time: 23.5 seconds — ABNORMAL HIGH (ref 11.4–15.2)

## 2017-11-25 LAB — GLUCOSE, CAPILLARY
Glucose-Capillary: 114 mg/dL — ABNORMAL HIGH (ref 65–99)
Glucose-Capillary: 219 mg/dL — ABNORMAL HIGH (ref 65–99)
Glucose-Capillary: 189 mg/dL — ABNORMAL HIGH (ref 65–99)
Glucose-Capillary: 119 mg/dL — ABNORMAL HIGH (ref 65–99)

## 2017-11-25 MED ORDER — WARFARIN SODIUM 1 MG PO TABS
1.0000 mg | ORAL_TABLET | Freq: Every day | ORAL | Status: DC
  Administered 2017-11-25: 1 mg via ORAL
  Filled 2017-11-25: qty 1

## 2017-11-25 NOTE — Care Management Note (Addendum)
Case Management Note Denise Gibbons RN, BSN Unit 4E-Case Manager-- [place] coverage [phone]  Patient Details  Name: Denise Cuevas MRN: [id number] Date of Birth: 21-Nov-1951  Subjective/Objective:  Pt admitted s/p AVR/MVR with redo sternotomy - pt extubated off vent this am 12/6                Action/Plan: PTA PT lived at home with spouse- CM to follow for transition to home needs-   Expected Discharge Date:                  Expected Discharge Plan:  [place]  In-House Referral:     Discharge planning Services  CM Consult  Post Acute Care Choice:  Home Health Choice offered to:  Patient, Spouse  DME Arranged:    DME Agency:     HH Arranged:   [place], PT Friend Agency:  [place]  Status of Service:  In process, will continue to follow  If discussed at Long Length of Stay Meetings, dates discussed:  12/13  Discharge Disposition: home/home health   Additional Comments:  11/24/17- 1030- Om [place] RN, CM- spoke with pt and spouse at bedside regarding transition to home needs- discussed recommendations for HHPT/OT- pt agreeable - pt also would benefit from [facility]- offered [place] program to pt and explained [place] referral with [place] program (high risk initiative) pt agreeable to using [facility] and the [facility] program- will ask MD for [facility] order for discharge. Pt states she has walker and shower chair available at home if needed.  Update- 1550- HH orders placed per MD- notified Butch Penny with [facility].   11/20/17- 1420- Melitta Tigue RN, CM- pt discussed in QC/LLOS mtg- per medical director- Dr. Loni Muse- pt has been placed in [facility] with [place] for long ICU stay and s/p AVR/MVR with vol overload- CM will f/u with pt prior to discharge- will need [place] orders at discharge.    Dawayne Gurleen, RN 11/25/2017, 10:08 AM

## 2017-11-25 NOTE — Progress Notes (Signed)
Physical Therapy Treatment Patient Details Name: Alfredo E Maka MRN: [id number] DOB: 05/25/1951 Today's Date: 11/25/2017    History of Present Illness Pt is a 66 y.o. female admitted on 12/5 for AVR and MVR. PMH - CABG, chf, idiopathic cirrhosis, htn, dm.    PT Comments    Patient received in chair with family present, pleasant and willing to work with skilled PT services this afternoon. Began session with brief review of sternal precautions with toilet transfers, then proceeded to ambulate in hallway approximately 400ft with no device, close S provided. Patient does continue to demonstrate reduced functional activity tolerance, required 1 standing rest break during gait period. Also worked on balance skills near edge of sink with min guard this session, patient unable to maintain tandem stance more than 10 seconds or SLS more than 3 seconds bilaterally. Education provided throughout session regarding energy conservation and safety. Patient left sitting upright in chair with all needs met, family present.     Follow Up Recommendations  Home health PT;Supervision - Intermittent     Equipment Recommendations  3in1 (PT)    Recommendations for Other Services       Precautions / Restrictions Precautions Precautions: Sternal;Fall Restrictions Weight Bearing Restrictions: No Other Position/Activity Restrictions: sternal precautions    Mobility  Bed Mobility               General bed mobility comments: DNT, patient received up in chair   Transfers Overall transfer level: Needs assistance Equipment used: None Transfers: Sit to/from Stand Sit to Stand: Min guard         General transfer comment: cues for sternal precautions   Ambulation/Gait Ambulation/Gait assistance: Supervision Ambulation Distance (Feet): 400 Feet Assistive device: None Gait Pattern/deviations: Step-through pattern;Decreased stride length     General Gait Details: min guard for safety, patient  steady during gait but does fatigue requiring 1 standing rest break; attempted to check O2 sats per finger monitor but unable to obtain accurate reading    Stairs            Wheelchair Mobility    Modified Rankin (Stroke Patients Only)       Balance Overall balance assessment: Needs assistance Sitting-balance support: Feet supported;No upper extremity supported Sitting balance-Leahy Scale: Good     Standing balance support: No upper extremity supported Standing balance-Leahy Scale: Fair                              Cognition Arousal/Alertness: Awake/alert Behavior During Therapy: WFL for tasks assessed/performed Overall Cognitive Status: Within Functional Limits for tasks assessed                                        Exercises      General Comments        Pertinent Vitals/Pain Pain Assessment: No/denies pain Pain Intervention(s): Monitored during session    Home Living                      Prior Function            PT Goals (current goals can now be found in the care plan section) Acute Rehab PT Goals Patient Stated Goal: to get better; home Wednesday PT Goal Formulation: With patient Time For Goal Achievement: 12/01/17 Potential to Achieve Goals: Good Progress towards PT goals: Progressing toward   goals    Frequency    Min 3X/week      PT Plan Current plan remains appropriate    Co-evaluation              AM-PAC PT "6 Clicks" Daily Activity  Outcome Measure  Difficulty turning over in bed (including adjusting bedclothes, sheets and blankets)?: Unable Difficulty moving from lying on back to sitting on the side of the bed? : Unable Difficulty sitting down on and standing up from a chair with arms (e.g., wheelchair, bedside commode, etc,.)?: Unable Help needed moving to and from a bed to chair (including a wheelchair)?: None Help needed walking in hospital room?: None Help needed climbing 3-5  steps with a railing? : A Little 6 Click Score: 14    End of Session   Activity Tolerance: Patient tolerated treatment well Patient left: in chair;with family/visitor present;with call bell/phone within reach   PT Visit Diagnosis: Unsteadiness on feet (R26.81);Muscle weakness (generalized) (M62.81)     Time: 0932-3557 PT Time Calculation (min) (ACUTE ONLY): 23 min  Charges:  $Gait Training: 8-22 mins $Self Care/Home Management: 8-22                    G Codes:       Deniece Ree PT, DPT, CBIS  Supplemental Physical Therapist [place]

## 2017-11-25 NOTE — Progress Notes (Addendum)
      [address]       [place] [address]             [phone]        13 Days Post-Op Procedure(s) (LRB): REDO STERNOTOMY (N/A) AORTIC VALVE REPLACEMENT (AVR)  USING 19 MM MAGNA EASE PERICARDIAL BIOPROSTHESIS-AORTIC, MODEL 3300TFX (N/A) MITRAL VALVE (MV) REPLACEMENT USING 25 MM MAGNA MITRAL EASE PERICARDIAL BIOPROSTHESIS, MODEL 7300TFX (N/A) TRANSESOPHAGEAL ECHOCARDIOGRAM (TEE) (N/A) ARTERIAL LINE INSERTION (Right)  Subjective: Patient just waking up. She states her breathing is pretty good. She was surprised she was able to walk without her walker yesterday.  Objective: Vital signs in last 24 hours: Temp:  [97.6 F (36.4 C)-98 F (36.7 C)] 97.8 F (36.6 C) (12/18 0445) Pulse Rate:  [76-85] 76 (12/18 0445) Cardiac Rhythm: Normal sinus rhythm (12/18 0400) Resp:  [15-29] 15 (12/18 0600) BP: (94-128)/(40-58) 94/48 (12/18 0445) SpO2:  [97 %-100 %] 98 % (12/18 0445) Weight:  [183 lb 6.4 oz (83.2 kg)] 183 lb 6.4 oz (83.2 kg) (12/18 0445)  Pre op weight 74.5 kg Current Weight  11/25/17 183 lb 6.4 oz (83.2 kg)      Intake/Output from previous day: 12/17 0701 - 12/18 0700 In: 963 [P.O.:960; I.V.:3] Out: 1001 [Urine:1000; Stool:1]   Physical Exam: Cardiovascular: RRR, flow murmur Pulmonary: Mostly clear Abdomen: Soft, non tender, bowel sounds present. Extremities: Pitting bilateral lower extremity edema but is decreasing. Ecchymosis right thigh and partial lower leg. Wounds: RLE wounds are clean and dry. Distal sternal wound with superficial skin separation. No sign of infection.     Lab Results: CBC: Recent Labs    11/24/17 0500  WBC 7.2  HGB 8.7*  HCT 26.8*  PLT 63*   BMET:  Recent Labs    11/23/17 0335  NA 131*  K 3.7  CL 100*  CO2 24  GLUCOSE 125*  BUN 9  CREATININE 0.94  CALCIUM 7.9*    PT/INR:  Lab Results  Component Value Date   INR 2.11 11/25/2017   INR 1.64 11/24/2017   INR 1.81 11/23/2017   ABG:  INR: Will  add last result for INR, ABG once components are confirmed Will add last 4 CBG results once components are confirmed  Assessment/Plan:  1. CV - SR in the 80's this am. On Amiodarone 200 mg bid, Lopressor 12.5 mg bid, Spironolactone 25 mg daily, and Coumadin. INR increased from 1.64 to 2.11  Will give Coumadin 1 mg tonight.  2.  Pulmonary - On room air.  PA/LAT ordered but not taken yet. Encourage incentive spirometer. 3. Volume Overload - On Lasix 40 mg IV daily 4.  Acute blood loss anemia - Last H and H stable at 8.7 and 26.8. Continue Ferrous sulfate. 5. Thrombocytopenia-last platelets slightly increased to 63,000. HIT negative. 6. Hyponatremia-last sodium slightly increased to 131. Likely related to diuresis. 8. DM-CBGs 248/231/119. On Insulin but will increase for better glucose control. Will restart Jardiance at discharge. Pre op HGA1C 7.5. She will need medical follow up after discharge. 9. Elevated AST 67, total bilirubin 4.1. Total bilirubin slowly decreasing-has history of cirrhosis. Not on a statin 10. Possibly discharge in am  Donielle M ZimmermanPA-C 11/25/2017,7:10 AM  Wound dry and stable Plan home in am on coumadin 1 mg day  I have seen and examined Denise Cuevas and agree with the above assessment  and plan.  [name] MD Beeper [phone] Office [phone] 11/25/2017 3:12 PM

## 2017-11-25 NOTE — Progress Notes (Signed)
CARDIAC REHAB PHASE I   PRE:  Rate/Rhythm: 81 SR  BP:  Supine:   Sitting: 88/44  Standing: 100/79   SaO2: 100%RA  MODE:  Ambulation: 420 ft   POST:  Rate/Rhythm: 87 SR  BP:  Supine:   Sitting: 114/50  Standing:    SaO2: 100%RA 1045-1111 Pt walked 420 ft on RA without rolling walker. Tolerated well. Stopped a couple of times to catch her breath. To sitting on side of bed after walk.   Graylon Good, RN BSN  11/25/2017 11:08 AM

## 2017-11-26 LAB — GLUCOSE, CAPILLARY: Glucose-Capillary: 71 mg/dL (ref 65–99)

## 2017-11-26 LAB — PROTIME-INR
INR: 3.01
Prothrombin Time: 31 seconds — ABNORMAL HIGH (ref 11.4–15.2)

## 2017-11-26 MED ORDER — WARFARIN SODIUM 1 MG PO TABS: 1.0000 mg | ORAL_TABLET | Freq: Every day | ORAL | 1 refills | Status: DC

## 2017-11-26 MED ORDER — POTASSIUM CHLORIDE CRYS ER 20 MEQ PO TBCR: 20.0000 meq | EXTENDED_RELEASE_TABLET | Freq: Every day | ORAL | 1 refills | Status: DC

## 2017-11-26 MED ORDER — AMIODARONE HCL 200 MG PO TABS: 200.0000 mg | ORAL_TABLET | Freq: Every day | ORAL | 1 refills | Status: DC

## 2017-11-26 MED ORDER — AMIODARONE HCL 200 MG PO TABS
200.0000 mg | ORAL_TABLET | Freq: Every day | ORAL | Status: DC
  Administered 2017-11-26: 200 mg via ORAL
  Filled 2017-11-26: qty 1

## 2017-11-26 MED ORDER — FUROSEMIDE 40 MG PO TABS: 40.0000 mg | ORAL_TABLET | Freq: Two times a day (BID) | ORAL | 1 refills | Status: DC

## 2017-11-26 NOTE — Progress Notes (Signed)
PT Cancellation Note  Patient Details Name: Denise Cuevas MRN: [id number] DOB: 1951-11-21   Cancelled Treatment:    Reason Eval/Treat Not Completed: Patient declined, no reason specified Patient received up and ambulating in hall with family member; patient very pleasant and reports she has no concerns regarding mobility or return home, she declines PT to focus on preparation for return home which she states she has been told may be as soon as in one hour.    Deniece Ree PT, DPT, CBIS  Supplemental Physical Therapist [facility]

## 2017-11-26 NOTE — Progress Notes (Addendum)
      [address]       [place] [address]             [phone]        14 Days Post-Op Procedure(s) (LRB): REDO STERNOTOMY (N/A) AORTIC VALVE REPLACEMENT (AVR)  USING 19 MM MAGNA EASE PERICARDIAL BIOPROSTHESIS-AORTIC, MODEL 3300TFX (N/A) MITRAL VALVE (MV) REPLACEMENT USING 25 MM MAGNA MITRAL EASE PERICARDIAL BIOPROSTHESIS, MODEL 7300TFX (N/A) TRANSESOPHAGEAL ECHOCARDIOGRAM (TEE) (N/A) ARTERIAL LINE INSERTION (Right)  Subjective: Patient sitting in chair. She has no complaints and is looking forward to going home.  Objective: Vital signs in last 24 hours: Temp:  [97.4 F (36.3 C)-98.2 F (36.8 C)] 98.1 F (36.7 C) (12/19 0322) Pulse Rate:  [73-90] 77 (12/18 2314) Cardiac Rhythm: Normal sinus rhythm (12/19 0701) Resp:  [16-17] 17 (12/19 0620) BP: (86-105)/(39-49) 99/43 (12/19 0322) SpO2:  [99 %-100 %] 100 % (12/18 2314) Weight:  [180 lb 16 oz (82.1 kg)] 180 lb 16 oz (82.1 kg) (12/19 0620)  Pre op weight 74.5 kg Current Weight  11/26/17 180 lb 16 oz (82.1 kg)      Intake/Output from previous day: 12/18 0701 - 12/19 0700 In: 140 [P.O.:120; I.V.:20] Out: 2500 [Urine:2500]   Physical Exam: Cardiovascular: RRR, flow murmur Pulmonary: Mostly clear Abdomen: Soft, non tender, bowel sounds present. Extremities: Pitting bilateral lower extremity edema but is decreasing. Ecchymosis right thigh and partial lower leg. Wounds: RLE wounds are clean and dry.  No sign of infection.     Lab Results: CBC: Recent Labs    11/24/17 0500  WBC 7.2  HGB 8.7*  HCT 26.8*  PLT 63*   BMET:  No results for input(s): NA, K, CL, CO2, GLUCOSE, BUN, CREATININE, CALCIUM in the last 72 hours.  PT/INR:  Lab Results  Component Value Date   INR 3.01 11/26/2017   INR 2.11 11/25/2017   INR 1.64 11/24/2017   ABG:  INR: Will add last result for INR, ABG once components are confirmed Will add last 4 CBG results once components are  confirmed  Assessment/Plan:  1. CV - SR in the 80's this am. On Amiodarone 200 mg daily, Lopressor 12.5 mg bid, Spironolactone 25 mg daily, and Coumadin. INR increased from 2.11 to 3.01.  Will give Coumadin 1 mg tomorrow night.  2.  Pulmonary - On room air.   Encourage incentive spirometer. 3. Volume Overload - On Lasix 40 mg IV daily. As discussed with Dr. Servando Snare, Lasix 40 mg bid for one week then daily thereafter. 4.  Acute blood loss anemia - Last H and H stable at 8.7 and 26.8. Continue Ferrous sulfate. 5. Thrombocytopenia-last platelets slightly increased to 63,000. HIT negative. 6. Hyponatremia-last sodium slightly increased to 131. Likely related to diuresis. 8. DM-CBGs 189/119/71. On Insulin but will increase for better glucose control. Will restart Jardiance at discharge. Pre op HGA1C 7.5. She will need medical follow up after discharge. 9. Elevated AST 67, total bilirubin 4.1. Total bilirubin slowly decreasing-has history of cirrhosis. Not on a statin because of this. 10. Likely discharge  Yonatan Guitron M ZimmermanPA-C 11/26/2017,8:04 AM

## 2017-11-26 NOTE — Progress Notes (Addendum)
OT Cancellation Note  Patient Details Name: Denise Cuevas MRN: [id number] DOB: [birth year]   Cancelled Treatment:    Reason Eval/Treat Not Completed: Other (comment): Pt reports feeling "shaky" after her blood sugar was low this morning and declined OOB mobility politely at this time. Did verbally review sternal precautions and ADL strategies. Will check back as able to progress with POC.   Addendum 11:14am: Pt received ambulating in hallway with husband dressed and ready for D/C. Pt reports understanding of sternal precautions, activity modification, and safety post-acute D/C and declines OT session at this time. Likely to D/C within the hour. Will check back next date if still admitted.   Norman Herrlich, MS OTR/L  Pager: [phone]   Norman Herrlich 11/26/2017, 8:49 AM

## 2017-11-27 ENCOUNTER — Telehealth (HOSPITAL_COMMUNITY): Payer: Self-pay

## 2017-11-27 NOTE — Telephone Encounter (Signed)
Patients insurance is active and benefits verified through [facility] - $20.00 co-pay, no deductible, out of pocket amount of $4,000/$1,160.14 has been met, no co-insurance, and no pre-authorization is required. Passport/reference [id number]  Patient will be contacted and scheduled after their follow up appt with the Cardiologist office upon review by the RN Navigator.

## 2017-11-28 ENCOUNTER — Telehealth: Payer: Self-pay | Admitting: Cardiology

## 2017-11-28 ENCOUNTER — Ambulatory Visit (INDEPENDENT_AMBULATORY_CARE_PROVIDER_SITE_OTHER): Payer: Medicare Other | Admitting: *Deleted

## 2017-11-28 DIAGNOSIS — Z5181 Encounter for therapeutic drug level monitoring: Secondary | ICD-10-CM | POA: Diagnosis not present

## 2017-11-28 DIAGNOSIS — I080 Rheumatic disorders of both mitral and aortic valves: Secondary | ICD-10-CM | POA: Diagnosis not present

## 2017-11-28 DIAGNOSIS — R011 Cardiac murmur, unspecified: Secondary | ICD-10-CM

## 2017-11-28 DIAGNOSIS — Z952 Presence of prosthetic heart valve: Secondary | ICD-10-CM

## 2017-11-28 DIAGNOSIS — I4891 Unspecified atrial fibrillation: Secondary | ICD-10-CM | POA: Insufficient documentation

## 2017-11-28 DIAGNOSIS — I050 Rheumatic mitral stenosis: Secondary | ICD-10-CM | POA: Diagnosis not present

## 2017-11-28 LAB — POCT INR: INR: 1.9

## 2017-11-28 MED ORDER — LEVOTHYROXINE SODIUM 50 MCG PO TABS: 50.0000 ug | ORAL_TABLET | Freq: Every day | ORAL | 0 refills | Status: DC

## 2017-11-28 NOTE — Telephone Encounter (Signed)
Received call transferred directly from operator and spoke with pt's home health nurse.  Nurse reports pt was admitted to [place] services yesterday.  Admitting physician to home health was Dr. Servando Snare. Pt was flagged as high risk for readmission during hospital and because of this Dr. Reynaldo Minium was consulted. Dr. Reynaldo Minium has given orders for diuretic protocol.  Before initiating this protocol home health nurse is requesting approval from cardiology.  Protocol includes IV lasix, metolazone, potassium and lab work as needed. Will review with Dr. [name].  Call back number for home health nurse is [phone]

## 2017-11-28 NOTE — Telephone Encounter (Signed)
IV protocol OK. No metolozone.  Candee Furbish, MD

## 2017-11-28 NOTE — Telephone Encounter (Signed)
NEED AND OK ON AN ORDER

## 2017-11-28 NOTE — Patient Instructions (Addendum)
A full discussion of the nature of anticoagulants has been carried out.  A benefit risk analysis has been presented to the patient, so that they understand the justification for choosing anticoagulation at this time. The need for frequent and regular monitoring, precise dosage adjustment and compliance is stressed.  Side effects of potential bleeding are discussed.  The patient should avoid any OTC items containing aspirin or ibuprofen, and should avoid great swings in general diet.  Avoid alcohol consumption.  Call if any signs of abnormal bleeding.    Description   Continue taking 1mg  (pink) daily.  Recheck INR in 1 week.  Coumadin Clinic [phone] Main [phone]

## 2017-11-28 NOTE — Telephone Encounter (Signed)
Thank you.  I also saw Ms. Hoston in the hallway going to the Coumadin clinic.  We discussed this. Candee Furbish, MD

## 2017-11-28 NOTE — Telephone Encounter (Signed)
I spoke with Lelan Pons (home health nurse) and told her diuretic protocol was OK except pt should not be given metolazone.

## 2017-12-03 ENCOUNTER — Ambulatory Visit (INDEPENDENT_AMBULATORY_CARE_PROVIDER_SITE_OTHER): Payer: Medicare Other | Admitting: Internal Medicine

## 2017-12-03 DIAGNOSIS — Z5181 Encounter for therapeutic drug level monitoring: Secondary | ICD-10-CM

## 2017-12-03 DIAGNOSIS — I050 Rheumatic mitral stenosis: Secondary | ICD-10-CM

## 2017-12-03 DIAGNOSIS — Z952 Presence of prosthetic heart valve: Secondary | ICD-10-CM | POA: Diagnosis not present

## 2017-12-03 DIAGNOSIS — I080 Rheumatic disorders of both mitral and aortic valves: Secondary | ICD-10-CM

## 2017-12-03 DIAGNOSIS — R011 Cardiac murmur, unspecified: Secondary | ICD-10-CM

## 2017-12-03 LAB — POCT INR: INR: 1.5

## 2017-12-08 ENCOUNTER — Ambulatory Visit (INDEPENDENT_AMBULATORY_CARE_PROVIDER_SITE_OTHER): Payer: Medicare Other | Admitting: Internal Medicine

## 2017-12-08 ENCOUNTER — Telehealth: Payer: Self-pay | Admitting: Cardiology

## 2017-12-08 DIAGNOSIS — I050 Rheumatic mitral stenosis: Secondary | ICD-10-CM

## 2017-12-08 DIAGNOSIS — Z5181 Encounter for therapeutic drug level monitoring: Secondary | ICD-10-CM

## 2017-12-08 DIAGNOSIS — I080 Rheumatic disorders of both mitral and aortic valves: Secondary | ICD-10-CM | POA: Diagnosis not present

## 2017-12-08 DIAGNOSIS — Z952 Presence of prosthetic heart valve: Secondary | ICD-10-CM

## 2017-12-08 DIAGNOSIS — R011 Cardiac murmur, unspecified: Secondary | ICD-10-CM

## 2017-12-08 LAB — POCT INR: INR: 1.6

## 2017-12-08 NOTE — Telephone Encounter (Signed)
New message    Lelan Pons from [employer] [phone] calling to report patient has stopped taking Iron. States she is not tolerating medication.   Pt c/o medication issue:  1. Name of Medication: ferrous sulfate 325 (65 FE) MG tablet 2. How are you currently taking this medication (dosage and times per day)? Take 1 tablet (325 mg total) daily with breakfast by mouth.  3. Are you having a reaction (difficulty breathing--STAT)? NO  4. What is your medication issue? Per Lelan Pons pt not tolerating medication by mouth

## 2017-12-08 NOTE — Telephone Encounter (Signed)
Denise Cuevas with [facility] back. Informed her that Dr. [name] did not prescribe ferrous sulfate and she might want to call her PCP. Denise Cuevas verbalized understanding and will call PCP.

## 2017-12-12 ENCOUNTER — Telehealth: Payer: Self-pay | Admitting: Cardiology

## 2017-12-12 ENCOUNTER — Ambulatory Visit (INDEPENDENT_AMBULATORY_CARE_PROVIDER_SITE_OTHER): Payer: Medicare Other | Admitting: Internal Medicine

## 2017-12-12 DIAGNOSIS — Z952 Presence of prosthetic heart valve: Secondary | ICD-10-CM

## 2017-12-12 DIAGNOSIS — I080 Rheumatic disorders of both mitral and aortic valves: Secondary | ICD-10-CM | POA: Diagnosis not present

## 2017-12-12 DIAGNOSIS — Z5181 Encounter for therapeutic drug level monitoring: Secondary | ICD-10-CM

## 2017-12-12 DIAGNOSIS — R011 Cardiac murmur, unspecified: Secondary | ICD-10-CM

## 2017-12-12 DIAGNOSIS — I050 Rheumatic mitral stenosis: Secondary | ICD-10-CM

## 2017-12-12 LAB — POCT INR: INR: 1.5

## 2017-12-12 NOTE — Telephone Encounter (Signed)
INR 1.5 See coumadin encounter

## 2017-12-12 NOTE — Telephone Encounter (Signed)
New message   PTINR results    If Home Health RN is calling please get Coumadin Nurse on the phone STAT  1.  Are you calling in regards to an appointment? no  2.  Are you calling for a refill ? no  3.  Are you having bleeding issues? no  4.  Do you need clearance to hold Coumadin? no   Please route to the Coumadin Clinic Pool

## 2017-12-12 NOTE — Patient Instructions (Signed)
Description   Today January 4th  take 2  tablets, then continue same dose 1 tablet daily except 1.5 tablets on Mondays, Wednesdays and Fridays.   Recheck INR on Friday.January 11th   Orders given to Kathlee Nations with [facility] @[phone] Coumadin Clinic [phone] Main [phone]

## 2017-12-15 ENCOUNTER — Encounter: Payer: Self-pay | Admitting: Cardiology

## 2017-12-15 ENCOUNTER — Ambulatory Visit (INDEPENDENT_AMBULATORY_CARE_PROVIDER_SITE_OTHER): Payer: Medicare Other | Admitting: Cardiology

## 2017-12-15 VITALS — BP 110/64 | HR 80 | Resp 16 | Ht 62.0 in | Wt 149.4 lb

## 2017-12-15 DIAGNOSIS — Z952 Presence of prosthetic heart valve: Secondary | ICD-10-CM | POA: Diagnosis not present

## 2017-12-15 DIAGNOSIS — R531 Weakness: Secondary | ICD-10-CM

## 2017-12-15 DIAGNOSIS — R5383 Other fatigue: Secondary | ICD-10-CM | POA: Diagnosis not present

## 2017-12-15 DIAGNOSIS — I25118 Atherosclerotic heart disease of native coronary artery with other forms of angina pectoris: Secondary | ICD-10-CM

## 2017-12-15 NOTE — Patient Instructions (Signed)
Medication Instructions:  1. DECREASE Amiodarone to 100 mg by mouth daily (Take 1/2 tablet by mouth once daily).   Labwork: TODAY: CBC, CMET  Testing/Procedures: NONE ORDERED TODAY  Follow-Up: Your physician recommends that you schedule a follow-up appointment with Dr. [name] in 2 months.   Any Other Special Instructions Will Be Listed Below (If Applicable).     If you need a refill on your cardiac medications before your next appointment, please call your pharmacy.

## 2017-12-15 NOTE — Progress Notes (Signed)
12/15/2017 Denise Cuevas   Apr 11, 1951  [id number]  Primary Physician [name], MD Primary Cardiologist: Dr. [name]   Reason for Visit/CC: [facility] f/u s/p AVR and MVR with Tissue Valves; post op afib  HPI:  Denise Cuevas is a 67 y.o. female who is being seen today for post hospital f/u. She is followed by Dr. [name] and has a h/o CAD s/p CABG in 2012 w/LIMA to LAD, SVG to OM with subsequent DES to distal anastomosis SVG OM and distal LAD in 2013. Also with a h/o valvular disease, non alcoholic cirrhosis and asthma.   She was admitted 10/15/17 for evaluation for cough, dyspnea, weight gain and intermitted CP. She was found to be in acute CHF and was treated with IV diuretics. Symptoms improved with diuresis. 2D echo was obtained 10/16/2017 which showed normal LVEF at 60-65%, moderate AS and moderate MR. This was followed by TEE which also confirmed significant valvular disease, moderate AS with mild AI and moderate to severe MS. R/LHC was conducted on 10/20/17 to reassess coronary anatomy as it was felt that she would likely need valve surgery. Cath showed double vessel CAD s/p 2V CABG with 1/2 patent bypass grafts. LIMA is patent. The stent in the distal LAD is patent without restenosis. The Circumflex artery is patent. There are patent stents in the proximal Circumflex and distal segment of the obtuse marginal branch. The proximal segment of the OM branch prior to the stent has a moderate non-obstructive stenosis. The vein graft to the OM branch is known to be occluded and was not selectively engaged.  Dr. Servando Snare, who performed her CABG in 2012 was consulted during her hospitalization. He recommended redo sternotomy for both AVR and MVR. She was discharged home and returned back several weeks later for valve replacement. Surgery was performed on 11/12/17. She selected pericardia tissue valves.   Post operative course was complicated by atrial fibrillation w/ RVR and she required  amiodarone. She was volume over loaded and diuresed. She had ABL anemia.  Last H and H was 8.7.  She also had thrombocytopenia. Her last platelet count was 63. HIT was checked and was negative.  She was started on Coumadin. Also of note, Lopressor was stopped secondary to labile BP.  She was restarted on Spironolactone which was taken pre operatively. Amiodarone was decreased to 200 mg daily prior to discharge. Her AST was elevated at 67 and total bilirubin was elevated at 4.1. As a result, statin was discontinued. She was discharged home on 11/26/17. Our office has been following her INR.   She presents back to clinic for post hospital f/u. She is here with her husband. She has been very weak. Little energy. She has a home health RN that comes in weekly. Getting some PT. She is here in a wheelchair. Also has had some nausea and vomiting. Decreased appetite. Some weight loss since discharge but her husband notes that weight has stabilized over the last 4 days. No chest pain or dyspnea.    Current Meds  Medication Sig  . albuterol (PROVENTIL HFA;VENTOLIN HFA) 108 (90 BASE) MCG/ACT inhaler Inhale 2 puffs into the lungs every 6 (six) hours as needed for wheezing or shortness of breath.  Marland Kitchen albuterol (PROVENTIL) (2.5 MG/3ML) 0.083% nebulizer solution Take 3 mLs (2.5 mg total) by nebulization every 2 (two) hours as needed for wheezing.  Marland Kitchen amiodarone (PACERONE) 200 MG tablet Take 1 tablet (200 mg total) by mouth daily.  Marland Kitchen aspirin 81 MG tablet  Take 81 mg by mouth daily.  . budesonide-formoterol (SYMBICORT) 80-4.5 MCG/ACT inhaler Inhale 2 puffs into the lungs 2 (two) times daily as needed (shortness of breath).   . DULoxetine (CYMBALTA) 30 MG capsule Take 30 mg by mouth daily with lunch. In conjunction with one 60 mg capsule to equal a total of 90 milligrams  . DULoxetine (CYMBALTA) 60 MG capsule Take 60 mg by mouth daily with lunch. In conjunction with one 30 mg capsule to equal a total of 90 milligrams  .  furosemide (LASIX) 40 MG tablet Take 1 tablet (40 mg total) by mouth 2 (two) times daily. For one week;then take Lasix 40 mg by mouth daily thereafter  . HUMALOG MIX 75/25 KWIKPEN (75-25) 100 UNIT/ML Kwikpen INJECT 60 UNITS SUBCUTANEOUSLY BEFORE BREAKFAST AND 60 UNITS BEFORE EVENING MEAL  . JARDIANCE 25 MG TABS tablet Take 25 mg daily by mouth.  . levothyroxine (SYNTHROID, LEVOTHROID) 50 MCG tablet Take 1 tablet (50 mcg total) by mouth daily before breakfast.  . mometasone-formoterol (DULERA) 100-5 MCG/ACT AERO Inhale 2 puffs into the lungs 2 (two) times daily as needed for wheezing or shortness of breath.  Marland Kitchen omeprazole-sodium bicarbonate (ZEGERID) 40-1100 MG per capsule Take 1 capsule by mouth daily before breakfast.  . potassium chloride SA (K-DUR,KLOR-CON) 20 MEQ tablet Take 1 tablet (20 mEq total) by mouth daily.  Marland Kitchen spironolactone (ALDACTONE) 25 MG tablet Take 1 tablet (25 mg total) by mouth daily.  . traMADol (ULTRAM) 50 MG tablet Take 1 tablet (50 mg total) by mouth every 6 (six) hours as needed for moderate pain.  Marland Kitchen warfarin (COUMADIN) 1 MG tablet Take 1 tablet (1 mg total) by mouth daily at 6 PM. Or as directed.   Allergies  Allergen Reactions  . Exenatide Nausea And Vomiting  . Ace Inhibitors Other (See Comments)    pseudoasthma   Past Medical History:  Diagnosis Date  . Angina   . Asthma   . CHF (congestive heart failure) ([place])   . Cirrhosis of liver ([place]) 08/2014   idiopathic  . COPD (chronic obstructive pulmonary disease) ([place])   . Coronary artery disease    s/p CABG  . Depression   . Diabetes mellitus    type 2  . Dyspnea   . Edema extremities    consistent edema lower legs, feet, and right quadrant abdominal pain-"goes and comes"  . Family history of adverse reaction to anesthesia    SISTER HAS NAUSEA  . Fibromyalgia   . GERD (gastroesophageal reflux disease)   . H/O hiatal hernia   . Headache(784.0)    occ. generalized  . Heart murmur   . Hypercholesteremia     . Hypertension   . Hypothyroid   . Myocardial infarction [facility]) 11/2011   MI x2- 4'33,2'95 coronary stent(chest pain episode at time)  . PONV (postoperative nausea and vomiting)    Family History  Problem Relation Age of Onset  . Heart disease Father 51       died of MI  . Emphysema Father        smoked  . Peripheral vascular disease Mother 24       bilaterial amputations   Past Surgical History:  Procedure Laterality Date  . AORTIC VALVE REPLACEMENT N/A 11/12/2017   Procedure: AORTIC VALVE REPLACEMENT (AVR)  USING 19 MM MAGNA EASE PERICARDIAL BIOPROSTHESIS-AORTIC, MODEL 3300TFX;  Surgeon: [name], MD;  Location: [place];  Service: Open Heart Surgery;  Laterality: N/A;  . ARTERIAL LINE INSERTION Right 11/12/2017  Procedure: ARTERIAL LINE INSERTION;  Surgeon: [name], MD;  Location: [place];  Service: Open Heart Surgery;  Laterality: Right;  placed in right femoral artery.  . CHOLECYSTECTOMY     open many yrs ago  . COLONOSCOPY WITH PROPOFOL N/A 08/24/2014   Procedure: COLONOSCOPY WITH PROPOFOL;  Surgeon: Arta Silence, MD;  Location: [facility] ENDOSCOPY;  Service: Endoscopy;  Laterality: N/A;  . CORONARY ANGIOPLASTY WITH STENT PLACEMENT  05/06/2012  . CORONARY ARTERY BYPASS GRAFT  11/27/2011   Procedure: CORONARY ARTERY BYPASS GRAFTING (CABG);  Surgeon: [name], MD;  Location: [place];  Service: Open Heart Surgery;  Laterality: N/A;  coronary artery bypass graft on pump times two utilizing left internal mammary artery and right saphenous vein harvested endoscopically   . ESOPHAGOGASTRODUODENOSCOPY (EGD) WITH PROPOFOL N/A 08/24/2014   Procedure: ESOPHAGOGASTRODUODENOSCOPY (EGD) WITH PROPOFOL;  Surgeon: Arta Silence, MD;  Location: [facility] ENDOSCOPY;  Service: Endoscopy;  Laterality: N/A;  . KNEE SURGERY Right    scope surgery  . LEFT HEART CATHETERIZATION WITH CORONARY ANGIOGRAM N/A 11/19/2011   Procedure: LEFT HEART CATHETERIZATION WITH CORONARY ANGIOGRAM;  Surgeon:  Candee Furbish, MD;  Location: [facility] CATH LAB;  Service: Cardiovascular;  Laterality: N/A;  . LEFT HEART CATHETERIZATION WITH CORONARY ANGIOGRAM  05/06/2012   Procedure: LEFT HEART CATHETERIZATION WITH CORONARY ANGIOGRAM;  Surgeon: Jettie Booze, MD;  Location: [facility] CATH LAB;  Service: Cardiovascular;;  . LEFT HEART CATHETERIZATION WITH CORONARY/GRAFT ANGIOGRAM N/A 03/24/2012   Procedure: LEFT HEART CATHETERIZATION WITH Beatrix Fetters;  Surgeon: Jettie Booze, MD;  Location: [facility] CATH LAB;  Service: Cardiovascular;  Laterality: N/A;  . MITRAL VALVE REPLACEMENT N/A 11/12/2017   Procedure: MITRAL VALVE (MV) REPLACEMENT USING 25 MM MAGNA MITRAL EASE PERICARDIAL BIOPROSTHESIS, MODEL 7300TFX;  Surgeon: [name], MD;  Location: [place];  Service: Open Heart Surgery;  Laterality: N/A;  Using 46mm Magna Ease Mitral Pericardial Bioprosthesis  . PERCUTANEOUS CORONARY STENT INTERVENTION (PCI-S)  03/24/2012   Procedure: PERCUTANEOUS CORONARY STENT INTERVENTION (PCI-S);  Surgeon: Jettie Booze, MD;  Location: [facility] CATH LAB;  Service: Cardiovascular;;  . PERCUTANEOUS CORONARY STENT INTERVENTION (PCI-S) Bilateral 05/06/2012   Procedure: PERCUTANEOUS CORONARY STENT INTERVENTION (PCI-S);  Surgeon: Jettie Booze, MD;  Location: [facility] CATH LAB;  Service: Cardiovascular;  Laterality: Bilateral;  . RIGHT/LEFT HEART CATH AND CORONARY/GRAFT ANGIOGRAPHY N/A 10/20/2017   Procedure: RIGHT/LEFT HEART CATH AND CORONARY/GRAFT ANGIOGRAPHY;  Surgeon: Burnell Blanks, MD;  Location: [place] CV LAB;  Service: Cardiovascular;  Laterality: N/A;  . TEE WITHOUT CARDIOVERSION N/A 10/20/2017   Procedure: TRANSESOPHAGEAL ECHOCARDIOGRAM (TEE);  Surgeon: Dorothy Spark, MD;  Location: [facility] ENDOSCOPY;  Service: Cardiovascular;  Laterality: N/A;  . TEE WITHOUT CARDIOVERSION N/A 11/12/2017   Procedure: TRANSESOPHAGEAL ECHOCARDIOGRAM (TEE);  Surgeon: [name], MD;  Location: [place];  Service: Open  Heart Surgery;  Laterality: N/A;   Social History   Socioeconomic History  . Marital status: Married    Spouse name: Not on file  . Number of children: Not on file  . Years of education: Not on file  . Highest education level: Not on file  Social Needs  . Financial resource strain: Not on file  . Food insecurity - worry: Not on file  . Food insecurity - inability: Not on file  . Transportation needs - medical: Not on file  . Transportation needs - non-medical: Not on file  Occupational History  . Occupation: Retired Doctor, hospital  Tobacco Use  . Smoking status: Never Smoker  .  Smokeless tobacco: Never Used  Substance and Sexual Activity  . Alcohol use: No  . Drug use: No  . Sexual activity: Yes  Other Topics Concern  . Not on file  Social History Narrative   Retired Naval architect           Review of Systems: General: negative for chills, fever, night sweats or weight changes.  Cardiovascular: negative for chest pain, dyspnea on exertion, edema, orthopnea, palpitations, paroxysmal nocturnal dyspnea or shortness of breath Dermatological: negative for rash Respiratory: negative for cough or wheezing Urologic: negative for hematuria Abdominal: negative for nausea, vomiting, diarrhea, bright red blood per rectum, melena, or hematemesis Neurologic: negative for visual changes, syncope, or dizziness All other systems reviewed and are otherwise negative except as noted above.   Physical Exam:  Blood pressure 110/64, pulse 80, resp. rate 16, height 5\' 2"  (1.575 m), weight 149 lb 6.4 oz (67.8 kg), SpO2 98 %.  General appearance: alert, cooperative and no distress Neck: no carotid bruit and no JVD Lungs: clear to auscultation bilaterally Heart: regular rate and rhythm, 2/6 murmur  Extremities: extremities normal, atraumatic, no cyanosis or edema Pulses: 2+ and symmetric Skin: Skin color, texture, turgor normal. No rashes or lesions Neurologic: Grossly normal  EKG NSR  -- personally reviewed   ASSESSMENT AND PLAN:   1. S/p AVR and MVR with Tissue Valves: no CP or dyspnea. She has f/u in Dr. Everrett Coombe office in 2 weeks.   2. Post-op Atrial Fibrillation: EKG today shows NSR. Continue amiodarone but will reduce dose down to 100 mg daily given some nausea and mild vomiting. Continue Coumadin. She has f/u with CT surgery in 2 more weeks. If still in NSR, may consider stopping amiodarone.   3. CAD: h/o CABG. No indication for re-do bypass with recent redostenotomy for valve replacement. Continue medical therapy.   4. HTN: controlled on current regimen. Metoprolol was discontinued during recent hospitalization due to labile BP. BP today is 110/64. Will continue current regimen for now.   5. Coumadin Therapy: INR followed in our coumadin clinic.   6. Weakness and Fatigue: post surgical recovery progressing slowly. Pt notes she feels very weak and fatigue. VSS in clinic today. NSR on EKG. We will check a CBC today as well as a CMP. Continue home health PT.   7. Insomnia: pt requesting sleep aid. We will check with Dr. Everrett Coombe office to see if they are ok with pt being on medication for sleep.   Follow-Up w/ CT surgery on 12/29/17. F/u with Dr. [name] in 6-8 weeks.   Brittainy Ladoris Gene, MHS [facility] 12/15/2017 3:31 PM

## 2017-12-16 ENCOUNTER — Telehealth: Payer: Self-pay

## 2017-12-16 DIAGNOSIS — E785 Hyperlipidemia, unspecified: Secondary | ICD-10-CM

## 2017-12-16 LAB — COMPREHENSIVE METABOLIC PANEL
ALBUMIN: 2.9 g/dL — AB (ref 3.6–4.8)
ALT: 53 IU/L — ABNORMAL HIGH (ref 0–32)
AST: 104 IU/L — ABNORMAL HIGH (ref 0–40)
Albumin/Globulin Ratio: 0.8 — ABNORMAL LOW (ref 1.2–2.2)
Alkaline Phosphatase: 237 IU/L — ABNORMAL HIGH (ref 39–117)
BUN / CREAT RATIO: 14 (ref 12–28)
BUN: 16 mg/dL (ref 8–27)
Bilirubin Total: 3 mg/dL — ABNORMAL HIGH (ref 0.0–1.2)
CALCIUM: 8.7 mg/dL (ref 8.7–10.3)
CO2: 22 mmol/L (ref 20–29)
CREATININE: 1.15 mg/dL — AB (ref 0.57–1.00)
Chloride: 100 mmol/L (ref 96–106)
GFR calc Af Amer: 58 mL/min/{1.73_m2} — ABNORMAL LOW (ref >59)
GFR, EST NON AFRICAN AMERICAN: 50 mL/min/{1.73_m2} — AB (ref >59)
GLOBULIN, TOTAL: 3.5 g/dL (ref 1.5–4.5)
Glucose: 295 mg/dL — ABNORMAL HIGH (ref 65–99)
Potassium: 4.8 mmol/L (ref 3.5–5.2)
SODIUM: 136 mmol/L (ref 134–144)
Total Protein: 6.4 g/dL (ref 6.0–8.5)

## 2017-12-16 LAB — CBC
Hematocrit: 36.5 % (ref 34.0–46.6)
Hemoglobin: 12 g/dL (ref 11.1–15.9)
MCH: 28.6 pg (ref 26.6–33.0)
MCHC: 32.9 g/dL (ref 31.5–35.7)
MCV: 87 fL (ref 79–97)
PLATELETS: 155 10*3/uL (ref 150–379)
RBC: 4.19 x10E6/uL (ref 3.77–5.28)
RDW: 21 % — AB (ref 12.3–15.4)
WBC: 6.5 10*3/uL (ref 3.4–10.8)

## 2017-12-16 NOTE — Telephone Encounter (Signed)
Spoke with patient and gave her instructions to stop Amiodarone, well hydrate, and check blood glucose daily.  I also set her up for a CMP next Tuesday.  Patient verbalized an understanding.

## 2017-12-16 NOTE — Telephone Encounter (Signed)
-----   Message from Consuelo Pandy, [place] sent at 12/16/2017  9:09 AM EST ----- Liver enzymes are elevated. This likely explains nausea and vomiting. I recommend that she completely stop amiodarone (medication for atrial fibrillation). She had Afib post op and EKG yesterday showed normal rhythm. I recommend repeating CMP in 1 week. Labs also suggest mild dehydration, likely from poor PO intake. Try to stay hydrated as much as possible. Blood glucose level was also elevated at 295. Make sure pt is checking blood glucose levels at home with glucometer. If levels remain high, check in w/ PCP as she may need adjustment of DM meds.

## 2017-12-17 ENCOUNTER — Emergency Department (HOSPITAL_COMMUNITY): Payer: Medicare Other

## 2017-12-17 ENCOUNTER — Other Ambulatory Visit: Payer: Self-pay

## 2017-12-17 ENCOUNTER — Inpatient Hospital Stay (HOSPITAL_COMMUNITY)
Admission: EM | Admit: 2017-12-17 | Discharge: 2017-12-19 | DRG: 442 | Disposition: A | Discharge status: Home / Self Care | Payer: Medicare Other | Attending: Internal Medicine | Admitting: Internal Medicine

## 2017-12-17 ENCOUNTER — Encounter (HOSPITAL_COMMUNITY): Payer: Self-pay

## 2017-12-17 DIAGNOSIS — Z955 Presence of coronary angioplasty implant and graft: Secondary | ICD-10-CM

## 2017-12-17 DIAGNOSIS — I4581 Long QT syndrome: Secondary | ICD-10-CM | POA: Diagnosis present

## 2017-12-17 DIAGNOSIS — M797 Fibromyalgia: Secondary | ICD-10-CM | POA: Diagnosis present

## 2017-12-17 DIAGNOSIS — K7290 Hepatic failure, unspecified without coma: Secondary | ICD-10-CM | POA: Diagnosis not present

## 2017-12-17 DIAGNOSIS — Z888 Allergy status to other drugs, medicaments and biological substances status: Secondary | ICD-10-CM

## 2017-12-17 DIAGNOSIS — K7460 Unspecified cirrhosis of liver: Secondary | ICD-10-CM | POA: Diagnosis present

## 2017-12-17 DIAGNOSIS — Z7984 Long term (current) use of oral hypoglycemic drugs: Secondary | ICD-10-CM

## 2017-12-17 DIAGNOSIS — IMO0002 Reserved for concepts with insufficient information to code with codable children: Secondary | ICD-10-CM | POA: Diagnosis present

## 2017-12-17 DIAGNOSIS — E039 Hypothyroidism, unspecified: Secondary | ICD-10-CM | POA: Diagnosis present

## 2017-12-17 DIAGNOSIS — I4891 Unspecified atrial fibrillation: Secondary | ICD-10-CM | POA: Diagnosis present

## 2017-12-17 DIAGNOSIS — I252 Old myocardial infarction: Secondary | ICD-10-CM

## 2017-12-17 DIAGNOSIS — Z7901 Long term (current) use of anticoagulants: Secondary | ICD-10-CM

## 2017-12-17 DIAGNOSIS — R188 Other ascites: Secondary | ICD-10-CM

## 2017-12-17 DIAGNOSIS — Z952 Presence of prosthetic heart valve: Secondary | ICD-10-CM

## 2017-12-17 DIAGNOSIS — I2510 Atherosclerotic heart disease of native coronary artery without angina pectoris: Secondary | ICD-10-CM | POA: Diagnosis present

## 2017-12-17 DIAGNOSIS — E875 Hyperkalemia: Secondary | ICD-10-CM | POA: Diagnosis present

## 2017-12-17 DIAGNOSIS — K7200 Acute and subacute hepatic failure without coma: Secondary | ICD-10-CM | POA: Diagnosis present

## 2017-12-17 DIAGNOSIS — I110 Hypertensive heart disease with heart failure: Secondary | ICD-10-CM | POA: Diagnosis present

## 2017-12-17 DIAGNOSIS — Z794 Long term (current) use of insulin: Secondary | ICD-10-CM

## 2017-12-17 DIAGNOSIS — Z951 Presence of aortocoronary bypass graft: Secondary | ICD-10-CM

## 2017-12-17 DIAGNOSIS — K7201 Acute and subacute hepatic failure with coma: Secondary | ICD-10-CM

## 2017-12-17 DIAGNOSIS — K7682 Hepatic encephalopathy: Secondary | ICD-10-CM | POA: Diagnosis present

## 2017-12-17 DIAGNOSIS — I5032 Chronic diastolic (congestive) heart failure: Secondary | ICD-10-CM | POA: Diagnosis present

## 2017-12-17 DIAGNOSIS — I10 Essential (primary) hypertension: Secondary | ICD-10-CM | POA: Diagnosis present

## 2017-12-17 DIAGNOSIS — F329 Major depressive disorder, single episode, unspecified: Secondary | ICD-10-CM | POA: Diagnosis present

## 2017-12-17 DIAGNOSIS — T500X5A Adverse effect of mineralocorticoids and their antagonists, initial encounter: Secondary | ICD-10-CM | POA: Diagnosis present

## 2017-12-17 DIAGNOSIS — K219 Gastro-esophageal reflux disease without esophagitis: Secondary | ICD-10-CM | POA: Diagnosis present

## 2017-12-17 DIAGNOSIS — J449 Chronic obstructive pulmonary disease, unspecified: Secondary | ICD-10-CM | POA: Diagnosis present

## 2017-12-17 DIAGNOSIS — E1165 Type 2 diabetes mellitus with hyperglycemia: Secondary | ICD-10-CM | POA: Diagnosis present

## 2017-12-17 DIAGNOSIS — Z79899 Other long term (current) drug therapy: Secondary | ICD-10-CM

## 2017-12-17 LAB — COMPREHENSIVE METABOLIC PANEL
ALBUMIN: 2.6 g/dL — AB (ref 3.5–5.0)
ALK PHOS: 206 U/L — AB (ref 38–126)
ALT: 57 U/L — ABNORMAL HIGH (ref 14–54)
ANION GAP: 10 (ref 5–15)
AST: 108 U/L — AB (ref 15–41)
BUN: 15 mg/dL (ref 6–20)
CALCIUM: 8.8 mg/dL — AB (ref 8.9–10.3)
CO2: 21 mmol/L — AB (ref 22–32)
Chloride: 103 mmol/L (ref 101–111)
Creatinine, Ser: 1.03 mg/dL — ABNORMAL HIGH (ref 0.44–1.00)
GFR calc Af Amer: >60 mL/min (ref >60)
GFR calc non Af Amer: 56 mL/min — ABNORMAL LOW (ref >60)
GLUCOSE: 203 mg/dL — AB (ref 65–99)
POTASSIUM: 5.1 mmol/L (ref 3.5–5.1)
Sodium: 134 mmol/L — ABNORMAL LOW (ref 135–145)
Total Bilirubin: 3.5 mg/dL — ABNORMAL HIGH (ref 0.3–1.2)
Total Protein: 6.4 g/dL — ABNORMAL LOW (ref 6.5–8.1)

## 2017-12-17 LAB — CBC WITH DIFFERENTIAL/PLATELET
BASOS ABS: 0 10*3/uL (ref 0.0–0.1)
BASOS PCT: 1 %
Eosinophils Absolute: 0.1 10*3/uL (ref 0.0–0.7)
Eosinophils Relative: 2 %
HEMATOCRIT: 37.9 % (ref 36.0–46.0)
HEMOGLOBIN: 11.9 g/dL — AB (ref 12.0–15.0)
Lymphocytes Relative: 27 %
Lymphs Abs: 1.9 10*3/uL (ref 0.7–4.0)
MCH: 28.3 pg (ref 26.0–34.0)
MCHC: 31.4 g/dL (ref 30.0–36.0)
MCV: 90.2 fL (ref 78.0–100.0)
MONOS PCT: 13 %
Monocytes Absolute: 0.9 10*3/uL (ref 0.1–1.0)
NEUTROS ABS: 4.2 10*3/uL (ref 1.7–7.7)
NEUTROS PCT: 57 %
Platelets: 123 10*3/uL — ABNORMAL LOW (ref 150–400)
RBC: 4.2 MIL/uL (ref 3.87–5.11)
RDW: 20.2 % — ABNORMAL HIGH (ref 11.5–15.5)
WBC: 7.2 10*3/uL (ref 4.0–10.5)

## 2017-12-17 LAB — ECHO TEE
AV Mean grad: 20 mmHg
Ao-asc: 2.8 cm
LVOT diameter: 20 mm
Mean grad: 5 mmHg
STJ: 2.4 cm
Sinus: 2.7 cm

## 2017-12-17 LAB — URINALYSIS, ROUTINE W REFLEX MICROSCOPIC
BACTERIA UA: NONE SEEN
BILIRUBIN URINE: NEGATIVE
Glucose, UA: >=500 mg/dL — AB
Hgb urine dipstick: NEGATIVE
KETONES UR: NEGATIVE mg/dL
LEUKOCYTES UA: NEGATIVE
NITRITE: NEGATIVE
PROTEIN: NEGATIVE mg/dL
Specific Gravity, Urine: 1.036 — ABNORMAL HIGH (ref 1.005–1.030)
pH: 7 (ref 5.0–8.0)

## 2017-12-17 LAB — PROTIME-INR
INR: 1.39
Prothrombin Time: 16.9 seconds — ABNORMAL HIGH (ref 11.4–15.2)

## 2017-12-17 LAB — AMMONIA: Ammonia: 204 umol/L — ABNORMAL HIGH (ref 9–35)

## 2017-12-17 LAB — RAPID URINE DRUG SCREEN, HOSP PERFORMED
AMPHETAMINES: NOT DETECTED
BENZODIAZEPINES: NOT DETECTED
Barbiturates: NOT DETECTED
Cocaine: NOT DETECTED
OPIATES: NOT DETECTED
TETRAHYDROCANNABINOL: NOT DETECTED

## 2017-12-17 LAB — TSH: TSH: 7.323 u[IU]/mL — AB (ref 0.350–4.500)

## 2017-12-17 MED ORDER — INSULIN ASPART 100 UNIT/ML ~~LOC~~ SOLN
0.0000 [IU] | Freq: Three times a day (TID) | SUBCUTANEOUS | Status: DC
  Administered 2017-12-18: 1 [IU] via SUBCUTANEOUS
  Administered 2017-12-18: 2 [IU] via SUBCUTANEOUS
  Administered 2017-12-19: 5 [IU] via SUBCUTANEOUS
  Administered 2017-12-19: 3 [IU] via SUBCUTANEOUS

## 2017-12-17 MED ORDER — MOMETASONE FURO-FORMOTEROL FUM 100-5 MCG/ACT IN AERO
2.0000 | INHALATION_SPRAY | Freq: Two times a day (BID) | RESPIRATORY_TRACT | Status: DC
  Administered 2017-12-18 – 2017-12-19 (×2): 2 via RESPIRATORY_TRACT
  Filled 2017-12-17: qty 8.8

## 2017-12-17 MED ORDER — ALBUTEROL SULFATE (2.5 MG/3ML) 0.083% IN NEBU: 3.0000 mL | INHALATION_SOLUTION | Freq: Four times a day (QID) | RESPIRATORY_TRACT | Status: DC | PRN

## 2017-12-17 MED ORDER — ONDANSETRON HCL 4 MG PO TABS: 4.0000 mg | ORAL_TABLET | Freq: Four times a day (QID) | ORAL | Status: DC | PRN

## 2017-12-17 MED ORDER — LACTULOSE 10 GM/15ML PO SOLN
30.0000 g | Freq: Three times a day (TID) | ORAL | Status: DC
  Administered 2017-12-18 – 2017-12-19 (×4): 30 g via ORAL
  Filled 2017-12-17 (×4): qty 60

## 2017-12-17 MED ORDER — LACTULOSE 10 GM/15ML PO SOLN
30.0000 g | Freq: Once | ORAL | Status: AC
  Administered 2017-12-17: 30 g via ORAL
  Filled 2017-12-17: qty 60

## 2017-12-17 MED ORDER — WARFARIN SODIUM 2 MG PO TABS
2.0000 mg | ORAL_TABLET | Freq: Once | ORAL | Status: AC
  Administered 2017-12-18: 2 mg via ORAL
  Filled 2017-12-17: qty 1

## 2017-12-17 MED ORDER — ONDANSETRON HCL 4 MG/2ML IJ SOLN
4.0000 mg | Freq: Four times a day (QID) | INTRAMUSCULAR | Status: DC | PRN
  Filled 2017-12-17: qty 2

## 2017-12-17 NOTE — ED Notes (Signed)
Bottom half of triage not able to be completed due to altered mental status. Will talk with husband

## 2017-12-17 NOTE — ED Provider Notes (Signed)
[facility] EMERGENCY DEPARTMENT Provider Note   CSN: [id number] Arrival date & time: 12/17/17  1944     History   Chief Complaint Chief Complaint  Patient presents with  . Weakness    HPI Denise Cuevas is a 67 y.o. female.  Patient has become more confused and lethargic for the last couple days.  Patient has a history of cirrhosis and had valve replacement 1 month ago   The history is provided by the patient. No language interpreter was used.  Weakness  Primary symptoms include no focal weakness. This is a new problem. The current episode started more than 2 days ago. The problem has not changed since onset.There was no focality noted. There has been no fever. The fever has been present for 1 to 2 days. Pertinent negatives include no shortness of breath, no chest pain and no headaches. There were no medications administered prior to arrival. Associated medical issues do not include trauma.    Past Medical History:  Diagnosis Date  . Angina   . Asthma   . CHF (congestive heart failure) ([place])   . 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 COPD (chronic obstructive pulmonary disease) ([place])   . Coronary artery disease    s/p CABG  . Depression   . Diabetes mellitus    type 2  . Dyspnea   . Edema extremities    consistent edema lower legs, feet, and right quadrant abdominal pain-"goes and comes"  . Family history of adverse reaction to anesthesia    SISTER HAS NAUSEA  . Fibromyalgia   . GERD (gastroesophageal reflux disease)   . H/O hiatal hernia   . Headache(784.0)    occ. generalized  . Heart murmur   . Hypercholesteremia   . Hypertension   . Hypothyroid   . Myocardial infarction [facility]) 11/2011   MI x2- 7'20,9'47 coronary stent(chest pain episode at time)  . PONV (postoperative nausea and vomiting)     Patient Active Problem List   Diagnosis Date Noted  . Atrial fibrillation ([place]) [I48.91] 11/28/2017  . S/P aortic valve and mitral valve replacement  11/12/2017  . Mitral valve stenosis and aortic valve stenosis 11/12/2017  . Hospital discharge follow-up 10/30/2017  . Nonrheumatic aortic valve stenosis   . Mitral valve stenosis   . Chronic bronchitis ([place])   . Other chest pain 10/17/2017  . Peripheral edema   . Unstable angina ([place]) 10/15/2017  . Asthma, cough variant   . Cirrhosis of liver not due to alcohol ([place]) 11/20/2016  . Pancytopenia ([place]) 11/20/2016  . Protein-calorie malnutrition, severe ([place]) 11/20/2016  . Acute diastolic CHF (congestive heart failure) ([place])   . Congestive heart failure ([place]) 11/19/2016  . Chest pain 05/29/2015  . Memory loss 05/29/2015  . Essential hypertension 03/20/2015  . Dyspnea 02/02/2015  . Acute respiratory distress 02/02/2015  . Heart murmur   . Depression   . GERD (gastroesophageal reflux disease)   . Unstable angina pectoris ([place]) 11/25/2011  . Hypothyroidism   . CAD (coronary artery disease)   . Other and unspecified angina pectoris 11/19/2011  . Type 2 diabetes mellitus, uncontrolled ([place]) 11/19/2011  . Myocardial infarction ([place]) 11/09/2011    Past Surgical History:  Procedure Laterality Date  . AORTIC VALVE REPLACEMENT N/A 11/12/2017   Procedure: AORTIC VALVE REPLACEMENT (AVR)  USING 19 MM MAGNA EASE PERICARDIAL BIOPROSTHESIS-AORTIC, MODEL 3300TFX;  Surgeon: [name], MD;  Location: [place];  Service: Open Heart Surgery;  Laterality: N/A;  .  ARTERIAL LINE INSERTION Right 11/12/2017   Procedure: ARTERIAL LINE INSERTION;  Surgeon: [name], MD;  Location: [place];  Service: Open Heart Surgery;  Laterality: Right;  placed in right femoral artery.  . CHOLECYSTECTOMY     open many yrs ago  . COLONOSCOPY WITH PROPOFOL N/A 08/24/2014   Procedure: COLONOSCOPY WITH PROPOFOL;  Surgeon: Arta Silence, MD;  Location: [facility] ENDOSCOPY;  Service: Endoscopy;  Laterality: N/A;  . CORONARY ANGIOPLASTY WITH STENT PLACEMENT  05/06/2012  . CORONARY ARTERY BYPASS GRAFT  11/27/2011    Procedure: CORONARY ARTERY BYPASS GRAFTING (CABG);  Surgeon: [name], MD;  Location: [place];  Service: Open Heart Surgery;  Laterality: N/A;  coronary artery bypass graft on pump times two utilizing left internal mammary artery and right saphenous vein harvested endoscopically   . ESOPHAGOGASTRODUODENOSCOPY (EGD) WITH PROPOFOL N/A 08/24/2014   Procedure: ESOPHAGOGASTRODUODENOSCOPY (EGD) WITH PROPOFOL;  Surgeon: Arta Silence, MD;  Location: [facility];  Service: Endoscopy;  Laterality: N/A;  . KNEE SURGERY Right    scope surgery  . LEFT HEART CATHETERIZATION WITH CORONARY ANGIOGRAM N/A 11/19/2011   Procedure: LEFT HEART CATHETERIZATION WITH CORONARY ANGIOGRAM;  Surgeon: Candee Furbish, MD;  Location: [facility] CATH LAB;  Service: Cardiovascular;  Laterality: N/A;  . LEFT HEART CATHETERIZATION WITH CORONARY ANGIOGRAM  05/06/2012   Procedure: LEFT HEART CATHETERIZATION WITH CORONARY ANGIOGRAM;  Surgeon: Jettie Booze, MD;  Location: [facility] CATH LAB;  Service: Cardiovascular;;  . LEFT HEART CATHETERIZATION WITH CORONARY/GRAFT ANGIOGRAM N/A 03/24/2012   Procedure: LEFT HEART CATHETERIZATION WITH Beatrix Fetters;  Surgeon: Jettie Booze, MD;  Location: [facility] CATH LAB;  Service: Cardiovascular;  Laterality: N/A;  . MITRAL VALVE REPLACEMENT N/A 11/12/2017   Procedure: MITRAL VALVE (MV) REPLACEMENT USING 25 MM MAGNA MITRAL EASE PERICARDIAL BIOPROSTHESIS, MODEL 7300TFX;  Surgeon: [name], MD;  Location: [place];  Service: Open Heart Surgery;  Laterality: N/A;  Using 58mm Magna Ease Mitral Pericardial Bioprosthesis  . PERCUTANEOUS CORONARY STENT INTERVENTION (PCI-S)  03/24/2012   Procedure: PERCUTANEOUS CORONARY STENT INTERVENTION (PCI-S);  Surgeon: Jettie Booze, MD;  Location: [facility] CATH LAB;  Service: Cardiovascular;;  . PERCUTANEOUS CORONARY STENT INTERVENTION (PCI-S) Bilateral 05/06/2012   Procedure: PERCUTANEOUS CORONARY STENT INTERVENTION (PCI-S);  Surgeon: Jettie Booze, MD;   Location: [facility] CATH LAB;  Service: Cardiovascular;  Laterality: Bilateral;  . RIGHT/LEFT HEART CATH AND CORONARY/GRAFT ANGIOGRAPHY N/A 10/20/2017   Procedure: RIGHT/LEFT HEART CATH AND CORONARY/GRAFT ANGIOGRAPHY;  Surgeon: Burnell Blanks, MD;  Location: [place] CV LAB;  Service: Cardiovascular;  Laterality: N/A;  . TEE WITHOUT CARDIOVERSION N/A 10/20/2017   Procedure: TRANSESOPHAGEAL ECHOCARDIOGRAM (TEE);  Surgeon: Dorothy Spark, MD;  Location: [facility] ENDOSCOPY;  Service: Cardiovascular;  Laterality: N/A;  . TEE WITHOUT CARDIOVERSION N/A 11/12/2017   Procedure: TRANSESOPHAGEAL ECHOCARDIOGRAM (TEE);  Surgeon: [name], MD;  Location: [place];  Service: Open Heart Surgery;  Laterality: N/A;    OB History    No data available       Home Medications    Prior to Admission medications   Medication Sig Start Date End Date Taking? Authorizing Provider  albuterol (PROVENTIL HFA;VENTOLIN HFA) 108 (90 BASE) MCG/ACT inhaler Inhale 2 puffs into the lungs every 6 (six) hours as needed for wheezing or shortness of breath. 02/04/15  Yes Ghimire, Henreitta Leber, MD  albuterol (PROVENTIL) (2.5 MG/3ML) 0.083% nebulizer solution Take 3 mLs (2.5 mg total) by nebulization every 2 (two) hours as needed for wheezing. 02/04/15  Yes Ghimire, Henreitta Leber, MD  aspirin 81 MG tablet Take 81 mg by mouth daily.   Yes [provider]  budesonide-formoterol (SYMBICORT) 80-4.5 MCG/ACT inhaler Inhale 2 puffs into the lungs 2 (two) times daily as needed (shortness of breath).    Yes [provider]  DULoxetine (CYMBALTA) 30 MG capsule Take 30 mg by mouth daily with lunch. In conjunction with one 60 mg capsule to equal a total of 90 milligrams   Yes [provider]  DULoxetine (CYMBALTA) 60 MG capsule Take 60 mg by mouth daily with lunch. In conjunction with one 30 mg capsule to equal a total of 90 milligrams   Yes [provider]  furosemide (LASIX) 40 MG tablet Take 1 tablet (40  mg total) by mouth 2 (two) times daily. For one week;then take Lasix 40 mg by mouth daily thereafter Patient taking differently: Take 40 mg by mouth daily.  11/26/17  Yes Zimmerman, Donielle M, PA-C  HUMALOG MIX 75/25 KWIKPEN (75-25) 100 UNIT/ML Kwikpen INJECT 60 UNITS SUBCUTANEOUSLY BEFORE BREAKFAST AND 60 UNITS BEFORE EVENING MEAL 09/11/17  Yes [provider]  JARDIANCE 25 MG TABS tablet Take 25 mg daily by mouth. 10/12/17  Yes [provider]  levothyroxine (SYNTHROID, LEVOTHROID) 50 MCG tablet Take 1 tablet (50 mcg total) by mouth daily before breakfast. 11/28/17  Yes Skains, Thana Farr, MD  mometasone-formoterol (DULERA) 100-5 MCG/ACT AERO Inhale 2 puffs into the lungs 2 (two) times daily as needed for wheezing or shortness of breath.   Yes [provider]  omeprazole-sodium bicarbonate (ZEGERID) 40-1100 MG per capsule Take 1 capsule by mouth daily before breakfast.   Yes [provider]  potassium chloride SA (K-DUR,KLOR-CON) 20 MEQ tablet Take 1 tablet (20 mEq total) by mouth daily. 11/26/17  Yes Lars Pinks M, PA-C  spironolactone (ALDACTONE) 25 MG tablet Take 1 tablet (25 mg total) by mouth daily. 11/23/16  Yes Hongalgi, Lenis Dickinson, MD  traMADol (ULTRAM) 50 MG tablet Take 1 tablet (50 mg total) by mouth every 6 (six) hours as needed for moderate pain. 11/24/17  Yes Barrett, Erin R, PA-C  warfarin (COUMADIN) 1 MG tablet Take 1 tablet (1 mg total) by mouth daily at 6 PM. Or as directed. Patient taking differently: Take 1-1.5 mg by mouth See admin instructions. Or as directed. 11/27/17  Yes Lars Pinks M, PA-C  ferrous sulfate 325 (65 FE) MG tablet Take 1 tablet (325 mg total) daily with breakfast by mouth. Patient not taking: Reported on 12/15/2017 10/23/17   Rogue Bussing, MD    Family History Family History  Problem Relation Age of Onset  . Heart disease Father 32       died of MI  . Emphysema Father        smoked  . Peripheral  vascular disease Mother 82       bilaterial amputations    Social History Social History   Tobacco Use  . Smoking status: Never Smoker  . Smokeless tobacco: Never Used  Substance Use Topics  . Alcohol use: No  . Drug use: No     Allergies   Exenatide and Ace inhibitors   Review of Systems Review of Systems  Constitutional: Negative for appetite change and fatigue.  HENT: Negative for congestion, ear discharge and sinus pressure.   Eyes: Negative for discharge.  Respiratory: Negative for cough and shortness of breath.   Cardiovascular: Negative for chest pain.  Gastrointestinal: Negative for abdominal pain and diarrhea.  Genitourinary: Negative for frequency and hematuria.  Musculoskeletal:  Negative for back pain.  Skin: Negative for rash.  Neurological: Positive for weakness. Negative for focal weakness, seizures and headaches.  Psychiatric/Behavioral: Negative for hallucinations.     Physical Exam Updated Vital Signs BP (!) 150/62 (BP Location: Right Arm)   Pulse 94   Temp 98 F (36.7 C) (Rectal)   Resp 18   Ht 5\' 6"  (1.676 m)   Wt 67.6 kg (149 lb)   LMP  (LMP Unknown)   SpO2 99%   BMI 24.05 kg/m   Physical Exam  Constitutional: She appears well-developed.  HENT:  Head: Normocephalic.  Eyes: Conjunctivae and EOM are normal. No scleral icterus.  Neck: Neck supple. No thyromegaly present.  Cardiovascular: Normal rate and regular rhythm. Exam reveals no gallop and no friction rub.  No murmur heard. Pulmonary/Chest: No stridor. She has no wheezes. She has no rales. She exhibits no tenderness.  Abdominal: She exhibits no distension. There is no tenderness. There is no rebound.  Musculoskeletal: Normal range of motion. She exhibits no edema.  Lymphadenopathy:    She has no cervical adenopathy.  Neurological: She exhibits normal muscle tone. Coordination normal.  Lethargic and confused  Skin: No rash noted. No erythema.  Psychiatric: Her behavior is normal.      ED Treatments / Results  Labs (all labs ordered are listed, but only abnormal results are displayed) Labs Reviewed  URINALYSIS, ROUTINE W REFLEX MICROSCOPIC - Abnormal; Notable for the following components:      Result Value   APPearance HAZY (*)    Specific Gravity, Urine 1.036 (*)    Glucose, UA >=500 (*)    Squamous Epithelial / LPF 0-5 (*)    All other components within normal limits  CBC WITH DIFFERENTIAL/PLATELET - Abnormal; Notable for the following components:   Hemoglobin 11.9 (*)    RDW 20.2 (*)    Platelets 123 (*)    All other components within normal limits  COMPREHENSIVE METABOLIC PANEL - Abnormal; Notable for the following components:   Sodium 134 (*)    CO2 21 (*)    Glucose, Bld 203 (*)    Creatinine, Ser 1.03 (*)    Calcium 8.8 (*)    Total Protein 6.4 (*)    Albumin 2.6 (*)    AST 108 (*)    ALT 57 (*)    Alkaline Phosphatase 206 (*)    Total Bilirubin 3.5 (*)    GFR calc non Af Amer 56 (*)    All other components within normal limits  AMMONIA - Abnormal; Notable for the following components:   Ammonia 204 (*)    All other components within normal limits  PROTIME-INR - Abnormal; Notable for the following components:   Prothrombin Time 16.9 (*)    All other components within normal limits  RAPID URINE DRUG SCREEN, HOSP PERFORMED    EKG  EKG Interpretation None       Radiology Dg Chest 2 View  Result Date: 12/17/2017 CLINICAL DATA:  Weakness that started 3-4 days ago. EXAM: CHEST  2 VIEW COMPARISON:  11/23/2017 FINDINGS: Normal heart size. Status post median sternotomy for mitral and aortic valve replacement. Low lung volumes. There is no edema, consolidation, effusion, or pneumothorax. IMPRESSION: Low volume chest without acute finding. Electronically Signed   By: Monte Fantasia M.D.   On: 12/17/2017 21:00   Ct Head Wo Contrast  Result Date: 12/17/2017 CLINICAL DATA:  Weakness for 4 days, altered mental status and lethargy. History of  hypertension, hypercholesterolemia and COPD.  EXAM: CT HEAD WITHOUT CONTRAST TECHNIQUE: Contiguous axial images were obtained from the base of the skull through the vertex without intravenous contrast. COMPARISON:  None. FINDINGS: BRAIN: No intraparenchymal hemorrhage, mass effect nor midline shift. The ventricles and sulci are normal for age. Minimal supratentorial white matter hypodensities less than expected for patient's age, though non-specific are most compatible with chronic small vessel ischemic disease. No acute large vascular territory infarcts. No abnormal extra-axial fluid collections. Basal cisterns are patent. VASCULAR: Mild calcific atherosclerosis of the carotid siphons. SKULL: No skull fracture. No significant scalp soft tissue swelling. SINUSES/ORBITS: The mastoid air-cells and included paranasal sinuses are well-aerated.The included ocular globes and orbital contents are non-suspicious. OTHER: None. IMPRESSION: Normal noncontrast CT HEAD for age. Electronically Signed   By: Elon Alas M.D.   On: 12/17/2017 21:11    Procedures Procedures (including critical care time)  Medications Ordered in ED Medications  lactulose (CHRONULAC) 10 GM/15ML solution 30 g (not administered)     Initial Impression / Assessment and Plan / ED Course  I have reviewed the triage vital signs and the nursing notes.  Pertinent labs & imaging results that were available during my care of the patient were reviewed by me and considered in my medical decision making (see chart for details).     CRITICAL CARE Performed by: [place] Ferguson Total critical care time: 35 minutes Critical care time was exclusive of separately billable procedures and treating other patients. Critical care was necessary to treat or prevent imminent or life-threatening deterioration. Critical care was time spent personally by me on the following activities: development of treatment plan with patient and/or surrogate as well as  nursing, discussions with consultants, evaluation of patient's response to treatment, examination of patient, obtaining history from patient or surrogate, ordering and performing treatments and interventions, ordering and review of laboratory studies, ordering and review of radiographic studies, pulse oximetry and re-evaluation of patient's condition.  Patient with hepatic encephalopathy.  She will be admitted to medicine Final Clinical Impressions(s) / ED Diagnoses   Final diagnoses:  None    ED Discharge Orders    None       [place] Ferguson, MD 12/17/17 2134

## 2017-12-17 NOTE — ED Notes (Signed)
Admitting MD at bedside.

## 2017-12-17 NOTE — ED Notes (Signed)
EKG done upon arrival to room @ 1945. EKG given and seen by Dr Roderic Palau. Patient on cardiac monitoring at this time.

## 2017-12-17 NOTE — H&P (Addendum)
History and Physical    Denise Cuevas XTG:[id number] DOB: [birth year] DOA: 12/17/2017  PCP: [name], MD   Patient coming from: Home   Chief Complaint: Altered mental status  HPI: Denise Cuevas is a 67 y.o. 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  Patient was brought to the ED today by EMS, with reports of weakness and increasing lethargy of about 4 days duration and confusion-not appropriately answering questions, that started today.  Spouse notes patient has lying of the bed for most of the day, also sleeps more.  No previous similar occurrence. History is obtained from the spouse, as patient's sleeping, not easily arousable, delayed response in following directions.  Spouse denies any recent fever or chills, patient had one episode of vomiting yesterday only, no loose stools, last bowel movement yesterday, no abdominal pain, has not noticed melena or bloody stools.  Good appetite Patient recently had mitral and aortic valve replacement 11/12/2017, and has otherwise been doing well.  ED Course: Stable vitals.  Sodium mildly low at 134, potassium elevated at 5.1, CBG 203 anion gap 10, ammonia 204, liver enzymes elevated with low albumin but consistent with prior.  CT head, chest x-ray negative for acute abnormality.  UA clean. Patient was given 1 dose of lactulose 30 mg ED hospitalist was called to admit for hepatic encephalopathy  Review of Systems: As per HPI otherwise 10 point review of systems negative.   Past Medical History:  Diagnosis Date  . Angina   . Asthma   . CHF (congestive heart failure) ([place])   . Cirrhosis of liver ([place]) 08/2014   idiopathic  . COPD (chronic obstructive pulmonary disease) ([place])   . Coronary artery disease    s/p CABG  . Depression   . Diabetes mellitus    type 2  . Dyspnea   . Edema extremities    consistent  edema lower legs, feet, and right quadrant abdominal pain-"goes and comes"  . Family history of adverse reaction to anesthesia    SISTER HAS NAUSEA  . Fibromyalgia   . GERD (gastroesophageal reflux disease)   . H/O hiatal hernia   . Headache(784.0)    occ. generalized  . Heart murmur   . Hypercholesteremia   . Hypertension   . Hypothyroid   . Myocardial infarction [facility]) 11/2011   MI x2- 2'70,3'50 coronary stent(chest pain episode at time)  . PONV (postoperative nausea and vomiting)     Past Surgical History:  Procedure Laterality Date  . AORTIC VALVE REPLACEMENT N/A 11/12/2017   Procedure: AORTIC VALVE REPLACEMENT (AVR)  USING 19 MM MAGNA EASE PERICARDIAL BIOPROSTHESIS-AORTIC, MODEL 3300TFX;  Surgeon: [name], MD;  Location: [place];  Service: Open Heart Surgery;  Laterality: N/A;  . ARTERIAL LINE INSERTION Right 11/12/2017   Procedure: ARTERIAL LINE INSERTION;  Surgeon: [name], MD;  Location: [place];  Service: Open Heart Surgery;  Laterality: Right;  placed in right femoral artery.  . CHOLECYSTECTOMY     open many yrs ago  . COLONOSCOPY WITH PROPOFOL N/A 08/24/2014   Procedure: COLONOSCOPY WITH PROPOFOL;  Surgeon: Arta Silence, MD;  Location: [facility] ENDOSCOPY;  Service: Endoscopy;  Laterality: N/A;  . CORONARY ANGIOPLASTY WITH STENT PLACEMENT  05/06/2012  . CORONARY ARTERY BYPASS GRAFT  11/27/2011   Procedure: CORONARY ARTERY BYPASS GRAFTING (CABG);  Surgeon: [name], MD;  Location: [place];  Service: Open Heart Surgery;  Laterality: N/A;  coronary artery bypass graft on pump times two utilizing left internal mammary artery and right saphenous vein harvested endoscopically   . ESOPHAGOGASTRODUODENOSCOPY (EGD) WITH PROPOFOL N/A 08/24/2014   Procedure: ESOPHAGOGASTRODUODENOSCOPY (EGD) WITH PROPOFOL;  Surgeon: Arta Silence, MD;  Location: [facility];  Service: Endoscopy;  Laterality: N/A;  . KNEE SURGERY Right    scope surgery  . LEFT HEART CATHETERIZATION  WITH CORONARY ANGIOGRAM N/A 11/19/2011   Procedure: LEFT HEART CATHETERIZATION WITH CORONARY ANGIOGRAM;  Surgeon: Candee Furbish, MD;  Location: [facility] CATH LAB;  Service: Cardiovascular;  Laterality: N/A;  . LEFT HEART CATHETERIZATION WITH CORONARY ANGIOGRAM  05/06/2012   Procedure: LEFT HEART CATHETERIZATION WITH CORONARY ANGIOGRAM;  Surgeon: Jettie Booze, MD;  Location: [facility] CATH LAB;  Service: Cardiovascular;;  . LEFT HEART CATHETERIZATION WITH CORONARY/GRAFT ANGIOGRAM N/A 03/24/2012   Procedure: LEFT HEART CATHETERIZATION WITH Beatrix Fetters;  Surgeon: Jettie Booze, MD;  Location: [facility] CATH LAB;  Service: Cardiovascular;  Laterality: N/A;  . MITRAL VALVE REPLACEMENT N/A 11/12/2017   Procedure: MITRAL VALVE (MV) REPLACEMENT USING 25 MM MAGNA MITRAL EASE PERICARDIAL BIOPROSTHESIS, MODEL 7300TFX;  Surgeon: [name], MD;  Location: [place];  Service: Open Heart Surgery;  Laterality: N/A;  Using 61mm Magna Ease Mitral Pericardial Bioprosthesis  . PERCUTANEOUS CORONARY STENT INTERVENTION (PCI-S)  03/24/2012   Procedure: PERCUTANEOUS CORONARY STENT INTERVENTION (PCI-S);  Surgeon: Jettie Booze, MD;  Location: [facility] CATH LAB;  Service: Cardiovascular;;  . PERCUTANEOUS CORONARY STENT INTERVENTION (PCI-S) Bilateral 05/06/2012   Procedure: PERCUTANEOUS CORONARY STENT INTERVENTION (PCI-S);  Surgeon: Jettie Booze, MD;  Location: [facility] CATH LAB;  Service: Cardiovascular;  Laterality: Bilateral;  . RIGHT/LEFT HEART CATH AND CORONARY/GRAFT ANGIOGRAPHY N/A 10/20/2017   Procedure: RIGHT/LEFT HEART CATH AND CORONARY/GRAFT ANGIOGRAPHY;  Surgeon: Burnell Blanks, MD;  Location: [place] CV LAB;  Service: Cardiovascular;  Laterality: N/A;  . TEE WITHOUT CARDIOVERSION N/A 10/20/2017   Procedure: TRANSESOPHAGEAL ECHOCARDIOGRAM (TEE);  Surgeon: Dorothy Spark, MD;  Location: [facility] ENDOSCOPY;  Service: Cardiovascular;  Laterality: N/A;  . TEE WITHOUT CARDIOVERSION N/A 11/12/2017    Procedure: TRANSESOPHAGEAL ECHOCARDIOGRAM (TEE);  Surgeon: [name], MD;  Location: [place];  Service: Open Heart Surgery;  Laterality: N/A;     reports that  has never smoked. she has never used smokeless tobacco. She reports that she does not drink alcohol or use drugs.  Allergies  Allergen Reactions  . Exenatide Nausea And Vomiting  . Ace Inhibitors Other (See Comments)    pseudoasthma    Family History  Problem Relation Age of Onset  . Heart disease Father 70       died of MI  . Emphysema Father        smoked  . Peripheral vascular disease Mother 55       bilaterial amputations    Prior to Admission medications   Medication Sig Start Date End Date Taking? Authorizing Provider  albuterol (PROVENTIL HFA;VENTOLIN HFA) 108 (90 BASE) MCG/ACT inhaler Inhale 2 puffs into the lungs every 6 (six) hours as needed for wheezing or shortness of breath. 02/04/15   Ghimire, Henreitta Leber, MD  albuterol (PROVENTIL) (2.5 MG/3ML) 0.083% nebulizer solution Take 3 mLs (2.5 mg total) by nebulization every 2 (two) hours as needed for wheezing. 02/04/15   Jonetta Osgood, MD  aspirin 81 MG tablet Take 81 mg by mouth daily.    [provider]  budesonide-formoterol (SYMBICORT) 80-4.5 MCG/ACT inhaler Inhale 2 puffs into the lungs 2 (two) times daily  as needed (shortness of breath).     [provider]  DULoxetine (CYMBALTA) 30 MG capsule Take 30 mg by mouth daily with lunch. In conjunction with one 60 mg capsule to equal a total of 90 milligrams    [provider]  DULoxetine (CYMBALTA) 60 MG capsule Take 60 mg by mouth daily with lunch. In conjunction with one 30 mg capsule to equal a total of 90 milligrams    [provider]  ferrous sulfate 325 (65 FE) MG tablet Take 1 tablet (325 mg total) daily with breakfast by mouth. Patient not taking: Reported on 12/15/2017 10/23/17   Rogue Bussing, MD  furosemide (LASIX) 40 MG tablet Take 1 tablet (40 mg total)  by mouth 2 (two) times daily. For one week;then take Lasix 40 mg by mouth daily thereafter 11/26/17   Nani Skillern, PA-C  HUMALOG MIX 75/25 KWIKPEN (75-25) 100 UNIT/ML Kwikpen INJECT 60 UNITS SUBCUTANEOUSLY BEFORE BREAKFAST AND 60 UNITS BEFORE EVENING MEAL 09/11/17   [provider]  JARDIANCE 25 MG TABS tablet Take 25 mg daily by mouth. 10/12/17   [provider]  levothyroxine (SYNTHROID, LEVOTHROID) 50 MCG tablet Take 1 tablet (50 mcg total) by mouth daily before breakfast. 11/28/17   Jerline Pain, MD  mometasone-formoterol (DULERA) 100-5 MCG/ACT AERO Inhale 2 puffs into the lungs 2 (two) times daily as needed for wheezing or shortness of breath.    [provider]  omeprazole-sodium bicarbonate (ZEGERID) 40-1100 MG per capsule Take 1 capsule by mouth daily before breakfast.    [provider]  potassium chloride SA (K-DUR,KLOR-CON) 20 MEQ tablet Take 1 tablet (20 mEq total) by mouth daily. 11/26/17   Nani Skillern, PA-C  spironolactone (ALDACTONE) 25 MG tablet Take 1 tablet (25 mg total) by mouth daily. 11/23/16   Hongalgi, Lenis Dickinson, MD  traMADol (ULTRAM) 50 MG tablet Take 1 tablet (50 mg total) by mouth every 6 (six) hours as needed for moderate pain. 11/24/17   Barrett, Erin R, PA-C  warfarin (COUMADIN) 1 MG tablet Take 1 tablet (1 mg total) by mouth daily at 6 PM. Or as directed. 11/27/17   Nani Skillern, PA-C    Physical Exam: Sleeping, a bit difficult to arouse, not answering questions verbally, delayed response in following directions.  Exam limited by patient's mental status Vitals:   12/17/17 1956 12/17/17 1957  BP: (!) 150/62   Pulse: 94   Resp: 18   Temp: 98 F (36.7 C)   TempSrc: Rectal   SpO2: 99%   Weight:  67.6 kg (149 lb)  Height:  5\' 6"  (1.676 m)    Constitutional: NAD, calm, comfortable Vitals:   12/17/17 1956 12/17/17 1957  BP: (!) 150/62   Pulse: 94   Resp: 18   Temp: 98 F (36.7 C)   TempSrc: Rectal    SpO2: 99%   Weight:  67.6 kg (149 lb)  Height:  5\' 6"  (1.676 m)   Eyes: PERRL, lids and conjunctivae normal ENMT: Mucous membranes are moist.  Neck: normal, supple, no masses, no thyromegaly Respiratory: clear to auscultation bilaterally, no wheezing, no crackles.  Anterior auscultation, normal respiratory effort. No accessory muscle use.  Cardiovascular: Regular rate and rhythm, no murmurs / rubs / gallops. No extremity edema. 2+ pedal pulses. No carotid bruits.  Healing sternotomy scar. Abdomen: no tenderness, no masses palpated. No hepatosplenomegaly. Bowel sounds positive.  Asterixis could not be accessed, due to mental status Musculoskeletal: no clubbing / cyanosis. No  joint deformity upper and lower extremities. Good ROM, no contractures. Normal muscle tone.  Skin: no rashes, lesions, ulcers. No induration Neurologic: Moving all extremities.  Full exam limited by patient's mental status Psychiatric: Could not be assessed.   Labs on Admission: I have personally reviewed following labs and imaging studies  CBC: Recent Labs  Lab 12/15/17 1612 12/17/17 2005  WBC 6.5 7.2  NEUTROABS  --  4.2  HGB 12.0 11.9*  HCT 36.5 37.9  MCV 87 90.2  PLT 155 109*   Basic Metabolic Panel: Recent Labs  Lab 12/15/17 1612 12/17/17 2005  NA 136 134*  K 4.8 5.1  CL 100 103  CO2 22 21*  GLUCOSE 295* 203*  BUN 16 15  CREATININE 1.15* 1.03*  CALCIUM 8.7 8.8*   Liver Function Tests: Recent Labs  Lab 12/15/17 1612 12/17/17 2005  AST 104* 108*  ALT 53* 57*  ALKPHOS 237* 206*  BILITOT 3.0* 3.5*  PROT 6.4 6.4*  ALBUMIN 2.9* 2.6*   No results for input(s): LIPASE, AMYLASE in the last 168 hours. Recent Labs  Lab 12/17/17 2000  AMMONIA 204*   Coagulation Profile: Recent Labs  Lab 12/12/17 12/17/17 2005  INR 1.5 1.39   Urine analysis:    Component Value Date/Time   COLORURINE YELLOW 12/17/2017 1945   APPEARANCEUR HAZY (A) 12/17/2017 1945   LABSPEC 1.036 (H) 12/17/2017 1945    PHURINE 7.0 12/17/2017 1945   GLUCOSEU >=500 (A) 12/17/2017 1945   HGBUR NEGATIVE 12/17/2017 1945   BILIRUBINUR NEGATIVE 12/17/2017 1945   KETONESUR NEGATIVE 12/17/2017 1945   PROTEINUR NEGATIVE 12/17/2017 1945   UROBILINOGEN 1.0 02/02/2015 1539   NITRITE NEGATIVE 12/17/2017 1945   LEUKOCYTESUR NEGATIVE 12/17/2017 1945    Radiological Exams on Admission: Dg Chest 2 View  Result Date: 12/17/2017 CLINICAL DATA:  Weakness that started 3-4 days ago. EXAM: CHEST  2 VIEW COMPARISON:  11/23/2017 FINDINGS: Normal heart size. Status post median sternotomy for mitral and aortic valve replacement. Low lung volumes. There is no edema, consolidation, effusion, or pneumothorax. IMPRESSION: Low volume chest without acute finding. Electronically Signed   By: Monte Fantasia M.D.   On: 12/17/2017 21:00   Ct Head Wo Contrast  Result Date: 12/17/2017 CLINICAL DATA:  Weakness for 4 days, altered mental status and lethargy. History of hypertension, hypercholesterolemia and COPD. EXAM: CT HEAD WITHOUT CONTRAST TECHNIQUE: Contiguous axial images were obtained from the base of the skull through the vertex without intravenous contrast. COMPARISON:  None. FINDINGS: BRAIN: No intraparenchymal hemorrhage, mass effect nor midline shift. The ventricles and sulci are normal for age. Minimal supratentorial white matter hypodensities less than expected for patient's age, though non-specific are most compatible with chronic small vessel ischemic disease. No acute large vascular territory infarcts. No abnormal extra-axial fluid collections. Basal cisterns are patent. VASCULAR: Mild calcific atherosclerosis of the carotid siphons. SKULL: No skull fracture. No significant scalp soft tissue swelling. SINUSES/ORBITS: The mastoid air-cells and included paranasal sinuses are well-aerated.The included ocular globes and orbital contents are non-suspicious. OTHER: None. IMPRESSION: Normal noncontrast CT HEAD for age. Electronically Signed    By: [place] Alas M.D.   On: 12/17/2017 21:11    EKG: None.EKG QTC prolonged at 502, normal QRS, no peaked T waves  Assessment/Plan Principal Problem:   Hepatic encephalopathy (HCC) Active Problems:   Type 2 diabetes mellitus, uncontrolled (HCC)   Hypothyroidism   Essential hypertension   Cirrhosis of liver not due to alcohol (HCC)   S/P aortic valve and mitral valve replacement  Atrial fibrillation ([place]) [I48.91]  Hepatic encephalopathy-confusion and lethargy characteristic.  Head CT chest x-ray UA unremarkable. ammonia was checked 204, prior 63.  History of nonalcoholic liver cirrhosis.  Lactulose 30 g given in ED. stable elevated liver enzymes -Continue lactulose at 30 g 3 times daily, goal 2-3 bowel movements daily, can reduce frequency if multiple bowel movements -otherwise N.p.o. for now until improvement in mental status - will check TSH- add on -Explained the importance of compliance with this medication to spouse  Hyperkalemia-K- 5.1. Likely from spironolactone.  EKG QTC prolonged at 502, normal QRS, no peaked T waves. - Hold for now -CMP a.m.  Nonalcoholic liver cirrhosis-now with hepatic encephalopathy, no sign of volume overload. - home medications Lasix and spironolactone, held for now with altered mental status, NPO.  Mitral valve and aortic valve replacement, atria fib- 11/12/2017.  Rates controlled not on nodal blocking agents. It appears she is on warfarin for atrial fibrillation. INR 1.3 subtherapeutic -Pharmacy consult for warfarin dosing  DM2- CBG- 203 - SSI - Mod Q6H while NPO - Start Home insulin when taking PO and change to SSI Ac/As  History of diastolic CHF-appears compensated.  Lasix spironolactone held for now  DVT prophylaxis: Warfarin Code Status: Full  Family Communication: Spouse at bedside Disposition Plan: 1-2 days  Consults called: None  Admission status: Obs   Bethena Roys MD [facility] Pager [phone]  If  11PM-7AM, please contact night-coverage [email or web address] Password [email or web address]  12/17/2017, 9:30 PM

## 2017-12-17 NOTE — ED Notes (Signed)
Pt trying to get up from bed and stating to husband "Let me GO!!" Pt redirected and now in CT

## 2017-12-17 NOTE — ED Triage Notes (Signed)
Husband called EMS. Pt complaining of weakness that started 3-4 days ago. Now patient is altered. Also reports being very lethargic increasingly.    VSS EKG normal; NSR on monitor CBG 220 99% RA Temp 97.0  History of open heart surgery in 2000.

## 2017-12-18 ENCOUNTER — Other Ambulatory Visit: Payer: Self-pay

## 2017-12-18 DIAGNOSIS — J449 Chronic obstructive pulmonary disease, unspecified: Secondary | ICD-10-CM | POA: Diagnosis present

## 2017-12-18 DIAGNOSIS — Z951 Presence of aortocoronary bypass graft: Secondary | ICD-10-CM | POA: Diagnosis not present

## 2017-12-18 DIAGNOSIS — T500X5A Adverse effect of mineralocorticoids and their antagonists, initial encounter: Secondary | ICD-10-CM | POA: Diagnosis present

## 2017-12-18 DIAGNOSIS — Z955 Presence of coronary angioplasty implant and graft: Secondary | ICD-10-CM | POA: Diagnosis not present

## 2017-12-18 DIAGNOSIS — Z794 Long term (current) use of insulin: Secondary | ICD-10-CM | POA: Diagnosis not present

## 2017-12-18 DIAGNOSIS — I10 Essential (primary) hypertension: Secondary | ICD-10-CM | POA: Diagnosis not present

## 2017-12-18 DIAGNOSIS — K7290 Hepatic failure, unspecified without coma: Principal | ICD-10-CM

## 2017-12-18 DIAGNOSIS — Z888 Allergy status to other drugs, medicaments and biological substances status: Secondary | ICD-10-CM | POA: Diagnosis not present

## 2017-12-18 DIAGNOSIS — Z7984 Long term (current) use of oral hypoglycemic drugs: Secondary | ICD-10-CM | POA: Diagnosis not present

## 2017-12-18 DIAGNOSIS — E875 Hyperkalemia: Secondary | ICD-10-CM | POA: Diagnosis present

## 2017-12-18 DIAGNOSIS — I5032 Chronic diastolic (congestive) heart failure: Secondary | ICD-10-CM | POA: Diagnosis present

## 2017-12-18 DIAGNOSIS — M797 Fibromyalgia: Secondary | ICD-10-CM | POA: Diagnosis present

## 2017-12-18 DIAGNOSIS — Z952 Presence of prosthetic heart valve: Secondary | ICD-10-CM | POA: Diagnosis not present

## 2017-12-18 DIAGNOSIS — K7460 Unspecified cirrhosis of liver: Secondary | ICD-10-CM

## 2017-12-18 DIAGNOSIS — I4581 Long QT syndrome: Secondary | ICD-10-CM | POA: Diagnosis present

## 2017-12-18 DIAGNOSIS — F329 Major depressive disorder, single episode, unspecified: Secondary | ICD-10-CM | POA: Diagnosis present

## 2017-12-18 DIAGNOSIS — I252 Old myocardial infarction: Secondary | ICD-10-CM | POA: Diagnosis not present

## 2017-12-18 DIAGNOSIS — E1165 Type 2 diabetes mellitus with hyperglycemia: Secondary | ICD-10-CM | POA: Diagnosis present

## 2017-12-18 DIAGNOSIS — Z79899 Other long term (current) drug therapy: Secondary | ICD-10-CM | POA: Diagnosis not present

## 2017-12-18 DIAGNOSIS — I2510 Atherosclerotic heart disease of native coronary artery without angina pectoris: Secondary | ICD-10-CM | POA: Diagnosis present

## 2017-12-18 DIAGNOSIS — E039 Hypothyroidism, unspecified: Secondary | ICD-10-CM | POA: Diagnosis present

## 2017-12-18 DIAGNOSIS — K219 Gastro-esophageal reflux disease without esophagitis: Secondary | ICD-10-CM | POA: Diagnosis present

## 2017-12-18 DIAGNOSIS — I110 Hypertensive heart disease with heart failure: Secondary | ICD-10-CM | POA: Diagnosis present

## 2017-12-18 DIAGNOSIS — Z7901 Long term (current) use of anticoagulants: Secondary | ICD-10-CM | POA: Diagnosis not present

## 2017-12-18 DIAGNOSIS — I4891 Unspecified atrial fibrillation: Secondary | ICD-10-CM | POA: Diagnosis present

## 2017-12-18 LAB — PROTIME-INR
INR: 1.53
Prothrombin Time: 18.3 seconds — ABNORMAL HIGH (ref 11.4–15.2)

## 2017-12-18 LAB — COMPREHENSIVE METABOLIC PANEL
ALBUMIN: 2.3 g/dL — AB (ref 3.5–5.0)
ALK PHOS: 183 U/L — AB (ref 38–126)
ALT: 51 U/L (ref 14–54)
AST: 92 U/L — AB (ref 15–41)
Anion gap: 10 (ref 5–15)
BILIRUBIN TOTAL: 3.6 mg/dL — AB (ref 0.3–1.2)
BUN: 16 mg/dL (ref 6–20)
CALCIUM: 8.9 mg/dL (ref 8.9–10.3)
CO2: 22 mmol/L (ref 22–32)
CREATININE: 0.97 mg/dL (ref 0.44–1.00)
Chloride: 107 mmol/L (ref 101–111)
GFR calc Af Amer: >60 mL/min (ref >60)
GFR calc non Af Amer: >60 mL/min (ref >60)
GLUCOSE: 160 mg/dL — AB (ref 65–99)
POTASSIUM: 4.5 mmol/L (ref 3.5–5.1)
Sodium: 139 mmol/L (ref 135–145)
TOTAL PROTEIN: 5.8 g/dL — AB (ref 6.5–8.1)

## 2017-12-18 LAB — GLUCOSE, CAPILLARY
GLUCOSE-CAPILLARY: 165 mg/dL — AB (ref 65–99)
Glucose-Capillary: 129 mg/dL — ABNORMAL HIGH (ref 65–99)
Glucose-Capillary: 143 mg/dL — ABNORMAL HIGH (ref 65–99)
Glucose-Capillary: 175 mg/dL — ABNORMAL HIGH (ref 65–99)
Glucose-Capillary: 127 mg/dL — ABNORMAL HIGH (ref 65–99)

## 2017-12-18 MED ORDER — FERROUS SULFATE 325 (65 FE) MG PO TABS
325.0000 mg | ORAL_TABLET | Freq: Every day | ORAL | Status: DC
  Administered 2017-12-19: 325 mg via ORAL
  Filled 2017-12-18: qty 1

## 2017-12-18 MED ORDER — LEVOTHYROXINE SODIUM 50 MCG PO TABS
50.0000 ug | ORAL_TABLET | Freq: Every day | ORAL | Status: DC
  Administered 2017-12-19: 50 ug via ORAL
  Filled 2017-12-18: qty 1

## 2017-12-18 MED ORDER — ASPIRIN 81 MG PO CHEW
81.0000 mg | CHEWABLE_TABLET | Freq: Every day | ORAL | Status: DC
  Administered 2017-12-18 – 2017-12-19 (×2): 81 mg via ORAL
  Filled 2017-12-18 (×2): qty 1

## 2017-12-18 MED ORDER — SPIRONOLACTONE 25 MG PO TABS
25.0000 mg | ORAL_TABLET | Freq: Every day | ORAL | Status: DC
  Administered 2017-12-18 – 2017-12-19 (×2): 25 mg via ORAL
  Filled 2017-12-18 (×2): qty 1

## 2017-12-18 MED ORDER — DULOXETINE HCL 60 MG PO CPEP: 60.0000 mg | ORAL_CAPSULE | Freq: Every day | ORAL | Status: DC

## 2017-12-18 MED ORDER — DULOXETINE HCL 30 MG PO CPEP
90.0000 mg | ORAL_CAPSULE | Freq: Every day | ORAL | Status: DC
  Administered 2017-12-19: 90 mg via ORAL
  Filled 2017-12-18: qty 3

## 2017-12-18 MED ORDER — FUROSEMIDE 40 MG PO TABS
40.0000 mg | ORAL_TABLET | Freq: Every day | ORAL | Status: DC
  Administered 2017-12-19: 40 mg via ORAL
  Filled 2017-12-18: qty 1

## 2017-12-18 MED ORDER — WARFARIN SODIUM 5 MG PO TABS
5.0000 mg | ORAL_TABLET | Freq: Once | ORAL | Status: AC
  Administered 2017-12-18: 5 mg via ORAL
  Filled 2017-12-18: qty 1

## 2017-12-18 MED ORDER — WARFARIN - PHARMACIST DOSING INPATIENT: Status: DC

## 2017-12-18 NOTE — Progress Notes (Signed)
ANTICOAGULATION CONSULT NOTE - Preliminary  Pharmacy Consult for Coumadin Indication: Atrial fibrillation  Allergies  Allergen Reactions  . Exenatide Nausea And Vomiting  . Ace Inhibitors Other (See Comments)    pseudoasthma    Patient Measurements: Height: 5\' 6"  (167.6 cm) Weight: 149 lb (67.6 kg) IBW/kg (Calculated) : 59.3 HEPARIN DW (KG): 67.6   Vital Signs: Temp: 98 F (36.7 C) (01/09 1956) Temp Source: Rectal (01/09 1956) BP: 152/63 (01/09 2300) Pulse Rate: 97 (01/09 2300)  Labs: Recent Labs    12/15/17 1612 12/17/17 2005  HGB 12.0 11.9*  HCT 36.5 37.9  PLT 155 123*  LABPROT  --  16.9*  INR  --  1.39  CREATININE 1.15* 1.03*   Estimated Creatinine Clearance: 51.7 mL/min (A) (by C-G formula based on SCr of 1.03 mg/dL (H)).  Medical History: Past Medical History:  Diagnosis Date  . Angina   . Asthma   . CHF (congestive heart failure) ([place])   . Cirrhosis of liver ([place]) 08/2014   idiopathic  . COPD (chronic obstructive pulmonary disease) ([place])   . Coronary artery disease    s/p CABG  . Depression   . Diabetes mellitus    type 2  . Dyspnea   . Edema extremities    consistent edema lower legs, feet, and right quadrant abdominal pain-"goes and comes"  . Family history of adverse reaction to anesthesia    SISTER HAS NAUSEA  . Fibromyalgia   . GERD (gastroesophageal reflux disease)   . H/O hiatal hernia   . Headache(784.0)    occ. generalized  . Heart murmur   . Hypercholesteremia   . Hypertension   . Hypothyroid   . Myocardial infarction [facility]) 11/2011   MI x2- 9'93,7'16 coronary stent(chest pain episode at time)  . PONV (postoperative nausea and vomiting)     Medications:   Assessment: 67 yo female on chronic coumadin therapy for A fib and is s/p valve replacement 12/18. She is admitted for hepatic encephalopathy. INR on admission is subtherapeutic at 1.3. Pharmacy has been consulted for coumadin dosing.  Goal of Therapy:  INR 2-3 Monitor  platelets by anticoagulation protocol: Yes   Plan:  Per patient report her last dose of coumadin was on 1/8. Her usual home dose is 1-1.5 mg. Since her INR is subtherapeutic we will give 2 mg x 1 dose and recheck her INR with her morning labs. Preliminary review of pertinent patient information completed.  Forestine Na clinical pharmacist will complete review during morning rounds to assess the patient and finalize treatment regimen.  Norberto Sorenson, [facility] 12/18/2017,12:01 AM

## 2017-12-18 NOTE — Progress Notes (Signed)
PROGRESS NOTE    Denise Cuevas  UUV:[id number] DOB: 09-07-1951 DOA: 12/17/2017 PCP: [name], MD     Brief Narrative:  67 year old woman admitted from home due to confusion.  She has a history significant for liver cirrhosis, type 2 diabetes, hypothyroidism, diastolic heart failure and atrial fibrillation.  She was found to have an ammonia level of 204 and was admitted for treatment of presumed hepatic encephalopathy.   Assessment & Plan:   Principal Problem:   Hepatic encephalopathy (HCC) Active Problems:   Type 2 diabetes mellitus, uncontrolled (HCC)   Hypothyroidism   Essential hypertension   Cirrhosis of liver not due to alcohol (HCC)   S/P aortic valve and mitral valve replacement   Atrial fibrillation (HCC) [I48.91]   Hepatic encephalopathy -When I saw her early this afternoon, she was still quite lethargic, RN has told me later in the afternoon that she has perked up a bit more and is now asking for food. -Plan to continue lactulose, recheck ammonia level in a.m. -Head CT was unremarkable. -We will need to discuss importance of compliance with this medication to family members.  Nonalcoholic liver cirrhosis -No current signs of volume overload. -Continue outpatient follow-up with GI. -Continue Lasix/spironolactone  Mitral and aortic valve replacement/atrial fibrillation -Rates controlled, she is on warfarin for anticoagulation. -INR remains subtherapeutic, as these are tissue valves she does not need to be bridged at this time.  Hypothyroidism -TSH is elevated at greater than 7. -For now will resume home dose of Synthroid, will need to discuss with patient whether she has been compliant with this, if she has her Synthroid dose needs to be increased.  Chronic diastolic CHF -Compensated, diuretics have been resumed.  Type 2 diabetes -Well-controlled, continue current regimen.   DVT prophylaxis: Coumadin Code Status: Full code Family Communication:  Patient Disposition Plan: Home when ready, anticipate 48-72 hours  Consultants:   None  Procedures:   None  Antimicrobials:  Anti-infectives (From admission, onward)   None       Subjective: Drowsy, opens eyes briefly to voice and falls quickly back to sleep  Objective: Vitals:   12/17/17 2300 12/18/17 0031 12/18/17 0600 12/18/17 1357  BP: (!) 152/63 (!) 150/69 (!) 145/71 (!) 132/57  Pulse: 97 96 95 96  Resp:  18 18 18   Temp:  98.4 F (36.9 C) 97.7 F (36.5 C) 98.4 F (36.9 C)  TempSrc:  Axillary Oral   SpO2: 98% 100% 100% 98%  Weight:  67 kg (147 lb 11.3 oz)    Height:  5\' 6"  (1.676 m)      Intake/Output Summary (Last 24 hours) at 12/18/2017 1901 Last data filed at 12/18/2017 1300 Gross per 24 hour  Intake 0 ml  Output 1 ml  Net -1 ml   Filed Weights   12/17/17 1957 12/18/17 0031  Weight: 67.6 kg (149 lb) 67 kg (147 lb 11.3 oz)    Examination:  General exam: Drowsy Respiratory system: Clear to auscultation. Respiratory effort normal. Cardiovascular system:RRR. No murmurs, rubs, gallops. Gastrointestinal system: Abdomen is nondistended, soft and nontender. No organomegaly or masses felt. Normal bowel sounds heard. Central nervous system: Unable to fully assess given drowsiness Extremities: No C/C/E, +pedal pulses Skin: No rashes, lesions or ulcers Psychiatry: Unable to fully assess given current mental state    Data Reviewed: I have personally reviewed following labs and imaging studies  CBC: Recent Labs  Lab 12/15/17 1612 12/17/17 2005  WBC 6.5 7.2  NEUTROABS  --  4.2  HGB 12.0 11.9*  HCT 36.5 37.9  MCV 87 90.2  PLT 155 952*   Basic Metabolic Panel: Recent Labs  Lab 12/15/17 1612 12/17/17 2005 12/18/17 0430  NA 136 134* 139  K 4.8 5.1 4.5  CL 100 103 107  CO2 22 21* 22  GLUCOSE 295* 203* 160*  BUN 16 15 16   CREATININE 1.15* 1.03* 0.97  CALCIUM 8.7 8.8* 8.9   GFR: Estimated Creatinine Clearance: 54.9 mL/min (by C-G formula  based on SCr of 0.97 mg/dL). Liver Function Tests: Recent Labs  Lab 12/15/17 1612 12/17/17 2005 12/18/17 0430  AST 104* 108* 92*  ALT 53* 57* 51  ALKPHOS 237* 206* 183*  BILITOT 3.0* 3.5* 3.6*  PROT 6.4 6.4* 5.8*  ALBUMIN 2.9* 2.6* 2.3*   No results for input(s): LIPASE, AMYLASE in the last 168 hours. Recent Labs  Lab 12/17/17 2000  AMMONIA 204*   Coagulation Profile: Recent Labs  Lab 12/12/17 12/17/17 2005 12/18/17 0430  INR 1.5 1.39 1.53   Cardiac Enzymes: No results for input(s): CKTOTAL, CKMB, CKMBINDEX, TROPONINI in the last 168 hours. BNP (last 3 results) No results for input(s): PROBNP in the last 8760 hours. HbA1C: No results for input(s): HGBA1C in the last 72 hours. CBG: Recent Labs  Lab 12/18/17 0039 12/18/17 0600 12/18/17 0746 12/18/17 1137 12/18/17 1835  GLUCAP 165* 129* 143* 175* 127*   Lipid Profile: No results for input(s): CHOL, HDL, LDLCALC, TRIG, CHOLHDL, LDLDIRECT in the last 72 hours. Thyroid Function Tests: Recent Labs    12/17/17 2005  TSH 7.323*   Anemia Panel: No results for input(s): VITAMINB12, FOLATE, FERRITIN, TIBC, IRON, RETICCTPCT in the last 72 hours. Urine analysis:    Component Value Date/Time   COLORURINE YELLOW 12/17/2017 1945   APPEARANCEUR HAZY (A) 12/17/2017 1945   LABSPEC 1.036 (H) 12/17/2017 1945   PHURINE 7.0 12/17/2017 1945   GLUCOSEU >=500 (A) 12/17/2017 1945   HGBUR NEGATIVE 12/17/2017 1945   BILIRUBINUR NEGATIVE 12/17/2017 1945   KETONESUR NEGATIVE 12/17/2017 1945   PROTEINUR NEGATIVE 12/17/2017 1945   UROBILINOGEN 1.0 02/02/2015 1539   NITRITE NEGATIVE 12/17/2017 1945   LEUKOCYTESUR NEGATIVE 12/17/2017 1945   Sepsis Labs: @LABRCNTIP (procalcitonin:4,lacticidven:4)  )No results found for this or any previous visit (from the past 240 hour(s)).       Radiology Studies: Dg Chest 2 View  Result Date: 12/17/2017 CLINICAL DATA:  Weakness that started 3-4 days ago. EXAM: CHEST  2 VIEW COMPARISON:   11/23/2017 FINDINGS: Normal heart size. Status post median sternotomy for mitral and aortic valve replacement. Low lung volumes. There is no edema, consolidation, effusion, or pneumothorax. IMPRESSION: Low volume chest without acute finding. Electronically Signed   By: Monte Fantasia M.D.   On: 12/17/2017 21:00   Ct Head Wo Contrast  Result Date: 12/17/2017 CLINICAL DATA:  Weakness for 4 days, altered mental status and lethargy. History of hypertension, hypercholesterolemia and COPD. EXAM: CT HEAD WITHOUT CONTRAST TECHNIQUE: Contiguous axial images were obtained from the base of the skull through the vertex without intravenous contrast. COMPARISON:  None. FINDINGS: BRAIN: No intraparenchymal hemorrhage, mass effect nor midline shift. The ventricles and sulci are normal for age. Minimal supratentorial white matter hypodensities less than expected for patient's age, though non-specific are most compatible with chronic small vessel ischemic disease. No acute large vascular territory infarcts. No abnormal extra-axial fluid collections. Basal cisterns are patent. VASCULAR: Mild calcific atherosclerosis of the carotid siphons. SKULL: No skull fracture. No significant scalp soft tissue swelling. SINUSES/ORBITS: The mastoid air-cells  and included paranasal sinuses are well-aerated.The included ocular globes and orbital contents are non-suspicious. OTHER: None. IMPRESSION: Normal noncontrast CT HEAD for age. Electronically Signed   By: Elon Alas M.D.   On: 12/17/2017 21:11        Scheduled Meds: . insulin aspart  0-9 Units Subcutaneous TID WC  . lactulose  30 g Oral TID  . mometasone-formoterol  2 puff Inhalation BID  . Warfarin - Pharmacist Dosing Inpatient   Does not apply Q24H   Continuous Infusions:   LOS: 0 days    Time spent: 25 minutes. Greater than 50% of this time was spent in direct contact with the patient coordinating care.     Lelon Frohlich, MD [facility] Pager [phone]  If 7PM-7AM, please contact night-coverage [email or web address] Password [email or web address] 12/18/2017, 7:01 PM

## 2017-12-18 NOTE — Progress Notes (Signed)
ANTICOAGULATION CONSULT NOTE -  Pharmacy Consult for Coumadin Indication: Atrial fibrillation  Allergies  Allergen Reactions  . Exenatide Nausea And Vomiting  . Ace Inhibitors Other (See Comments)    pseudoasthma   Patient Measurements: Height: 5\' 6"  (167.6 cm) Weight: 147 lb 11.3 oz (67 kg) IBW/kg (Calculated) : 59.3 HEPARIN DW (KG): 67  Vital Signs: Temp: 97.7 F (36.5 C) (01/10 0600) Temp Source: Oral (01/10 0600) BP: 145/71 (01/10 0600) Pulse Rate: 95 (01/10 0600)  Labs: Recent Labs    12/15/17 1612 12/17/17 2005 12/18/17 0430  HGB 12.0 11.9*  --   HCT 36.5 37.9  --   PLT 155 123*  --   LABPROT  --  16.9* 18.3*  INR  --  1.39 1.53  CREATININE 1.15* 1.03* 0.97   Estimated Creatinine Clearance: 54.9 mL/min (by C-G formula based on SCr of 0.97 mg/dL).  Medical History: Past Medical History:  Diagnosis Date  . Angina   . Asthma   . CHF (congestive heart failure) ([place])   . Cirrhosis of liver ([place]) 08/2014   idiopathic  . COPD (chronic obstructive pulmonary disease) ([place])   . Coronary artery disease    s/p CABG  . Depression   . Diabetes mellitus    type 2  . Dyspnea   . Edema extremities    consistent edema lower legs, feet, and right quadrant abdominal pain-"goes and comes"  . Family history of adverse reaction to anesthesia    SISTER HAS NAUSEA  . Fibromyalgia   . GERD (gastroesophageal reflux disease)   . H/O hiatal hernia   . Headache(784.0)    occ. generalized  . Heart murmur   . Hypercholesteremia   . Hypertension   . Hypothyroid   . Myocardial infarction [facility]) 11/2011   MI x2- 4'03,4'74 coronary stent(chest pain episode at time)  . PONV (postoperative nausea and vomiting)    Medications Prior to Admission  Medication Sig Dispense Refill Last Dose  . albuterol (PROVENTIL HFA;VENTOLIN HFA) 108 (90 BASE) MCG/ACT inhaler Inhale 2 puffs into the lungs every 6 (six) hours as needed for wheezing or shortness of breath. 1 Inhaler 2 unknown  .  albuterol (PROVENTIL) (2.5 MG/3ML) 0.083% nebulizer solution Take 3 mLs (2.5 mg total) by nebulization every 2 (two) hours as needed for wheezing. 75 mL 0 unknown  . aspirin 81 MG tablet Take 81 mg by mouth daily.   12/17/2017 at Unknown time  . budesonide-formoterol (SYMBICORT) 80-4.5 MCG/ACT inhaler Inhale 2 puffs into the lungs 2 (two) times daily as needed (shortness of breath).    unknown  . DULoxetine (CYMBALTA) 30 MG capsule Take 30 mg by mouth daily with lunch. In conjunction with one 60 mg capsule to equal a total of 90 milligrams   12/16/2017 at Unknown time  . DULoxetine (CYMBALTA) 60 MG capsule Take 60 mg by mouth daily with lunch. In conjunction with one 30 mg capsule to equal a total of 90 milligrams   12/16/2017 at Unknown time  . furosemide (LASIX) 40 MG tablet Take 1 tablet (40 mg total) by mouth 2 (two) times daily. For one week;then take Lasix 40 mg by mouth daily thereafter (Patient taking differently: Take 40 mg by mouth daily. ) 30 tablet 1 12/16/2017 at Unknown time  . HUMALOG MIX 75/25 KWIKPEN (75-25) 100 UNIT/ML Kwikpen INJECT 60 UNITS SUBCUTANEOUSLY BEFORE BREAKFAST AND 60 UNITS BEFORE EVENING MEAL  10 12/16/2017 at Unknown time  . JARDIANCE 25 MG TABS tablet Take 25 mg daily by  mouth.  5 12/16/2017 at Unknown time  . levothyroxine (SYNTHROID, LEVOTHROID) 50 MCG tablet Take 1 tablet (50 mcg total) by mouth daily before breakfast. 30 tablet 0 12/16/2017 at Unknown time  . mometasone-formoterol (DULERA) 100-5 MCG/ACT AERO Inhale 2 puffs into the lungs 2 (two) times daily as needed for wheezing or shortness of breath.   unknown  . omeprazole-sodium bicarbonate (ZEGERID) 40-1100 MG per capsule Take 1 capsule by mouth daily before breakfast.   12/16/2017 at Unknown time  . potassium chloride SA (K-DUR,KLOR-CON) 20 MEQ tablet Take 1 tablet (20 mEq total) by mouth daily. 30 tablet 1 12/16/2017 at Unknown time  . spironolactone (ALDACTONE) 25 MG tablet Take 1 tablet (25 mg total) by mouth daily. 30  tablet 0 12/16/2017 at Unknown time  . traMADol (ULTRAM) 50 MG tablet Take 1 tablet (50 mg total) by mouth every 6 (six) hours as needed for moderate pain. 30 tablet 0 12/16/2017 at Unknown time  . warfarin (COUMADIN) 1 MG tablet Take 1 tablet (1 mg total) by mouth daily at 6 PM. Or as directed. (Patient taking differently: Take 1-1.5 mg by mouth See admin instructions. 1.5mg  on MWF and 1mg  on all other days) 30 tablet 1 12/16/2017 at 18-1900  . ferrous sulfate 325 (65 FE) MG tablet Take 1 tablet (325 mg total) daily with breakfast by mouth. (Patient not taking: Reported on 12/15/2017) 30 tablet 1 Not Taking   Assessment: 67 yo female on chronic coumadin therapy for A fib and is s/p valve replacement 12/18. She is admitted for hepatic encephalopathy. INR on admission is subtherapeutic at 1.3. Pharmacy has been consulted for coumadin dosing.  Goal of Therapy:  INR 2-3 Monitor platelets by anticoagulation protocol: Yes   Plan:  Coumadin 5mg  x 1 to boost INR INR daily  Hart Robinsons A, RPH 12/18/2017,12:35 PM

## 2017-12-19 LAB — GLUCOSE, CAPILLARY
GLUCOSE-CAPILLARY: 280 mg/dL — AB (ref 65–99)
GLUCOSE-CAPILLARY: 298 mg/dL — AB (ref 65–99)
Glucose-Capillary: 263 mg/dL — ABNORMAL HIGH (ref 65–99)
Glucose-Capillary: 233 mg/dL — ABNORMAL HIGH (ref 65–99)

## 2017-12-19 LAB — PROTIME-INR
INR: 1.62
Prothrombin Time: 19.1 seconds — ABNORMAL HIGH (ref 11.4–15.2)

## 2017-12-19 LAB — AMMONIA: Ammonia: 60 umol/L — ABNORMAL HIGH (ref 9–35)

## 2017-12-19 MED ORDER — WARFARIN SODIUM 2.5 MG PO TABS: 2.5000 mg | ORAL_TABLET | Freq: Once | ORAL | Status: DC

## 2017-12-19 MED ORDER — LACTULOSE 10 GM/15ML PO SOLN: 30.0000 g | Freq: Three times a day (TID) | ORAL | 0 refills | Status: DC

## 2017-12-19 NOTE — Progress Notes (Signed)
Removed IV-clean, dry, intact. Reviewed d/c paperwork with pt and husband. Reviewed new prescription and home medication and follow-up appts. Wheeled stable pt and belongings to main entrance with husband.

## 2017-12-19 NOTE — Care Management Important Message (Signed)
Important Message  Patient Details  Name: Denise Cuevas MRN: [id number] Date of Birth: 10/27/1951   Medicare Important Message Given:  Yes    Sherald Barge, RN 12/19/2017, 1:31 PM

## 2017-12-19 NOTE — Progress Notes (Signed)
ANTICOAGULATION CONSULT NOTE -  Pharmacy Consult for Coumadin Indication: Atrial fibrillation  Allergies  Allergen Reactions  . Exenatide Nausea And Vomiting  . Ace Inhibitors Other (See Comments)    pseudoasthma   Patient Measurements: Height: 5\' 6"  (167.6 cm) Weight: 147 lb 11.3 oz (67 kg) IBW/kg (Calculated) : 59.3 HEPARIN DW (KG): 67  Vital Signs: Temp: 98 F (36.7 C) (01/11 0436) Temp Source: Oral (01/11 0436) BP: 150/54 (01/11 0436) Pulse Rate: 94 (01/11 0436)  Labs: Recent Labs    12/17/17 2005 12/18/17 0430 12/19/17 0439  HGB 11.9*  --   --   HCT 37.9  --   --   PLT 123*  --   --   LABPROT 16.9* 18.3* 19.1*  INR 1.39 1.53 1.62  CREATININE 1.03* 0.97  --    Estimated Creatinine Clearance: 54.9 mL/min (by C-G formula based on SCr of 0.97 mg/dL).  Medical History: Past Medical History:  Diagnosis Date  . Angina   . Asthma   . CHF (congestive heart failure) ([place])   . Cirrhosis of liver ([place]) 08/2014   idiopathic  . COPD (chronic obstructive pulmonary disease) ([place])   . Coronary artery disease    s/p CABG  . Depression   . Diabetes mellitus    type 2  . Dyspnea   . Edema extremities    consistent edema lower legs, feet, and right quadrant abdominal pain-"goes and comes"  . Family history of adverse reaction to anesthesia    SISTER HAS NAUSEA  . Fibromyalgia   . GERD (gastroesophageal reflux disease)   . H/O hiatal hernia   . Headache(784.0)    occ. generalized  . Heart murmur   . Hypercholesteremia   . Hypertension   . Hypothyroid   . Myocardial infarction [facility]) 11/2011   MI x2- 5'18,8'41 coronary stent(chest pain episode at time)  . PONV (postoperative nausea and vomiting)    Medications Prior to Admission  Medication Sig Dispense Refill Last Dose  . albuterol (PROVENTIL HFA;VENTOLIN HFA) 108 (90 BASE) MCG/ACT inhaler Inhale 2 puffs into the lungs every 6 (six) hours as needed for wheezing or shortness of breath. 1 Inhaler 2 unknown  .  albuterol (PROVENTIL) (2.5 MG/3ML) 0.083% nebulizer solution Take 3 mLs (2.5 mg total) by nebulization every 2 (two) hours as needed for wheezing. 75 mL 0 unknown  . aspirin 81 MG tablet Take 81 mg by mouth daily.   12/17/2017 at Unknown time  . budesonide-formoterol (SYMBICORT) 80-4.5 MCG/ACT inhaler Inhale 2 puffs into the lungs 2 (two) times daily as needed (shortness of breath).    unknown  . DULoxetine (CYMBALTA) 30 MG capsule Take 30 mg by mouth daily with lunch. In conjunction with one 60 mg capsule to equal a total of 90 milligrams   12/16/2017 at Unknown time  . DULoxetine (CYMBALTA) 60 MG capsule Take 60 mg by mouth daily with lunch. In conjunction with one 30 mg capsule to equal a total of 90 milligrams   12/16/2017 at Unknown time  . furosemide (LASIX) 40 MG tablet Take 1 tablet (40 mg total) by mouth 2 (two) times daily. For one week;then take Lasix 40 mg by mouth daily thereafter (Patient taking differently: Take 40 mg by mouth daily. ) 30 tablet 1 12/16/2017 at Unknown time  . HUMALOG MIX 75/25 KWIKPEN (75-25) 100 UNIT/ML Kwikpen INJECT 60 UNITS SUBCUTANEOUSLY BEFORE BREAKFAST AND 60 UNITS BEFORE EVENING MEAL  10 12/16/2017 at Unknown time  . JARDIANCE 25 MG TABS tablet Take  25 mg daily by mouth.  5 12/16/2017 at Unknown time  . levothyroxine (SYNTHROID, LEVOTHROID) 50 MCG tablet Take 1 tablet (50 mcg total) by mouth daily before breakfast. 30 tablet 0 12/16/2017 at Unknown time  . mometasone-formoterol (DULERA) 100-5 MCG/ACT AERO Inhale 2 puffs into the lungs 2 (two) times daily as needed for wheezing or shortness of breath.   unknown  . omeprazole-sodium bicarbonate (ZEGERID) 40-1100 MG per capsule Take 1 capsule by mouth daily before breakfast.   12/16/2017 at Unknown time  . potassium chloride SA (K-DUR,KLOR-CON) 20 MEQ tablet Take 1 tablet (20 mEq total) by mouth daily. 30 tablet 1 12/16/2017 at Unknown time  . spironolactone (ALDACTONE) 25 MG tablet Take 1 tablet (25 mg total) by mouth daily. 30  tablet 0 12/16/2017 at Unknown time  . traMADol (ULTRAM) 50 MG tablet Take 1 tablet (50 mg total) by mouth every 6 (six) hours as needed for moderate pain. 30 tablet 0 12/16/2017 at Unknown time  . warfarin (COUMADIN) 1 MG tablet Take 1 tablet (1 mg total) by mouth daily at 6 PM. Or as directed. (Patient taking differently: Take 1-1.5 mg by mouth See admin instructions. 1.5mg  on MWF and 1mg  on all other days) 30 tablet 1 12/16/2017 at 18-1900  . ferrous sulfate 325 (65 FE) MG tablet Take 1 tablet (325 mg total) daily with breakfast by mouth. (Patient not taking: Reported on 12/15/2017) 30 tablet 1 Not Taking   Assessment: 67 yo female on chronic coumadin therapy for A fib and is s/p aortic valve replacement 12/18. She is admitted for hepatic encephalopathy. INR on admission is subtherapeutic at 1.3. Pharmacy has been consulted for coumadin dosing.  Goal of Therapy:  INR 2-3 Monitor platelets by anticoagulation protocol: Yes   Plan:  Coumadin 2.5mg  x 1 today INR daily Monitor for bleeding  Isac Sarna, BS [place], BCPS Clinical Pharmacist Pager [phone] 12/19/2017,8:54 AM

## 2017-12-19 NOTE — Care Management Note (Signed)
Case Management Note  Patient Details  Name: Denise Cuevas MRN: [id number] Date of Birth: 1951/09/12  Subjective/Objective:      Admitted with encephalopathy. Pt is from home, live with husband, is ind with ADL's. Has sister who assists as needed. She has walker and BSC she used pta. She is active with [facility] for PT. She feels she is able to ind perform PT and prefers not to resume services at DC. She will have 24/7 supervision, transportation, follow-up provider and will be able to obtain medications. Pt communicates no needs or concerns about DC plan. Sister present for DC planning.              Action/Plan: DC home today with self care. Denise Cuevas, [facility] rep, aware of DC and plan to not resume services.   Expected Discharge Date:  12/19/17               Expected Discharge Plan:  Home/Self Care  In-House Referral:  NA  Discharge planning Services  CM Consult  Post Acute Care Choice:  NA Choice offered to:  NA  Status of Service:  Completed, signed off  Denise Barge, RN 12/19/2017, 1:32 PM

## 2017-12-19 NOTE — Discharge Summary (Signed)
Physician Discharge Summary  Denise Cuevas IWP:[id number] DOB: 07-15-1951 DOA: 12/17/2017  PCP: [name], MD  Admit date: 12/17/2017 Discharge date: 12/19/2017  Time spent: 45 minutes  Recommendations for Outpatient Follow-up:  -Will be discharged home today. -Advised to follow-up with primary care provider in 2 weeks.  Discharge Diagnoses:  Principal Problem:   Hepatic encephalopathy ([place]) Active Problems:   Type 2 diabetes mellitus, uncontrolled ([place])   Hypothyroidism   Essential hypertension   Cirrhosis of liver not due to alcohol ([facility])   S/P aortic valve and mitral valve replacement   Atrial fibrillation ([place]) [I48.91]   Discharge Condition: Stable and improved  Filed Weights   12/17/17 1957 12/18/17 0031  Weight: 67.6 kg (149 lb) 67 kg (147 lb 11.3 oz)    History of present illness:  As per Dr. Denton Brick on 1/9: Denise Cuevas is a 67 y.o. 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  Patient was brought to the ED today by EMS, with reports of weakness and increasing lethargy of about 4 days duration and confusion-not appropriately answering questions, that started today.  Spouse notes patient has lying of the bed for most of the day, also sleeps more.  No previous similar occurrence. History is obtained from the spouse, as patient's sleeping, not easily arousable, delayed response in following directions.  Spouse denies any recent fever or chills, patient had one episode of vomiting yesterday only, no loose stools, last bowel movement yesterday, no abdominal pain, has not noticed melena or bloody stools.  Good appetite Patient recently had mitral and aortic valve replacement 11/12/2017, and has otherwise been doing well.  ED Course: Stable vitals.  Sodium mildly low at 134, potassium elevated at 5.1, CBG 203 anion gap 10, ammonia 204,  liver enzymes elevated with low albumin but consistent with prior.  CT head, chest x-ray negative for acute abnormality.  UA clean. Patient was given 1 dose of lactulose 30 mg ED hospitalist was called to admit for hepatic encephalopathy    Hospital Course:   Hepatic encephalopathy -Ammonia level down to 60 today from over 200 on admission. -Patient fully alert, awake and oriented, husband agrees she is back at her baseline level of mentation. -We will need to continue lactulose, patient advised of importance of medication compliance.  Nonalcoholic liver cirrhosis -No current signs of volume overload. -Continue outpatient follow-up with GI. -Continue Lasix/spironolactone  Mitral and aortic valve replacement/atrial fibrillation -Rates controlled, she is on warfarin for anticoagulation. -INR remains subtherapeutic, as these are tissue valves she does not need to be bridged at this time.  Hypothyroidism -TSH is elevated at greater than 7. -Patient admits she has not been fully compliant with her Synthroid, to this effect her dose has not been increased. -Do recommend repeat thyroid function tests in 4-6 weeks for further dosing adjustments.  Chronic diastolic CHF -Compensated, diuretics have been resumed.  Type 2 diabetes -Well-controlled, continue current regimen.  Procedures:  None   Consultations:  None  Discharge Instructions  Discharge Instructions    Diet - low sodium heart healthy   Complete by:  As directed    Increase activity slowly   Complete by:  As directed      Allergies as of 12/19/2017      Reactions   Exenatide Nausea And Vomiting   Ace Inhibitors Other (See Comments)   pseudoasthma      Medication List  STOP taking these medications   budesonide-formoterol 80-4.5 MCG/ACT inhaler Commonly known as:  SYMBICORT   potassium chloride SA 20 MEQ tablet Commonly known as:  K-DUR,KLOR-CON     TAKE these medications   albuterol (2.5 MG/3ML)  0.083% nebulizer solution Commonly known as:  PROVENTIL Take 3 mLs (2.5 mg total) by nebulization every 2 (two) hours as needed for wheezing.   albuterol 108 (90 Base) MCG/ACT inhaler Commonly known as:  PROVENTIL HFA;VENTOLIN HFA Inhale 2 puffs into the lungs every 6 (six) hours as needed for wheezing or shortness of breath.   aspirin 81 MG tablet Take 81 mg by mouth daily.   DULoxetine 30 MG capsule Commonly known as:  CYMBALTA Take 30 mg by mouth daily with lunch. In conjunction with one 60 mg capsule to equal a total of 90 milligrams   DULoxetine 60 MG capsule Commonly known as:  CYMBALTA Take 60 mg by mouth daily with lunch. In conjunction with one 30 mg capsule to equal a total of 90 milligrams   ferrous sulfate 325 (65 FE) MG tablet Take 1 tablet (325 mg total) daily with breakfast by mouth.   furosemide 40 MG tablet Commonly known as:  LASIX Take 1 tablet (40 mg total) by mouth 2 (two) times daily. For one week;then take Lasix 40 mg by mouth daily thereafter What changed:    when to take this  additional instructions   HUMALOG MIX 75/25 KWIKPEN (75-25) 100 UNIT/ML Kwikpen Generic drug:  Insulin Lispro Prot & Lispro INJECT 60 UNITS SUBCUTANEOUSLY BEFORE BREAKFAST AND 60 UNITS BEFORE EVENING MEAL   JARDIANCE 25 MG Tabs tablet Generic drug:  empagliflozin Take 25 mg daily by mouth.   lactulose 10 GM/15ML solution Commonly known as:  CHRONULAC Take 45 mLs (30 g total) by mouth 3 (three) times daily.   levothyroxine 50 MCG tablet Commonly known as:  SYNTHROID, LEVOTHROID Take 1 tablet (50 mcg total) by mouth daily before breakfast.   mometasone-formoterol 100-5 MCG/ACT Aero Commonly known as:  DULERA Inhale 2 puffs into the lungs 2 (two) times daily as needed for wheezing or shortness of breath.   omeprazole-sodium bicarbonate 40-1100 MG capsule Commonly known as:  ZEGERID Take 1 capsule by mouth daily before breakfast.   spironolactone 25 MG tablet Commonly  known as:  ALDACTONE Take 1 tablet (25 mg total) by mouth daily.   traMADol 50 MG tablet Commonly known as:  ULTRAM Take 1 tablet (50 mg total) by mouth every 6 (six) hours as needed for moderate pain.   warfarin 1 MG tablet Commonly known as:  COUMADIN Take as directed. If you are unsure how to take this medication, talk to your nurse or doctor. Original instructions:  Take 1 tablet (1 mg total) by mouth daily at 6 PM. Or as directed. What changed:    how much to take  when to take this  additional instructions      Allergies  Allergen Reactions  . Exenatide Nausea And Vomiting  . Ace Inhibitors Other (See Comments)    pseudoasthma   Follow-up Information    [name], MD. Schedule an appointment as soon as possible for a visit on 12/26/2017.   Specialty:  Family Medicine Why:  12:30pm Contact information: [place] [address] [phone]            The results of significant diagnostics from this hospitalization (including imaging, microbiology, ancillary and laboratory) are listed below for reference.    Significant Diagnostic Studies:  Dg Chest 2 View  Result Date: 12/17/2017 CLINICAL DATA:  Weakness that started 3-4 days ago. EXAM: CHEST  2 VIEW COMPARISON:  11/23/2017 FINDINGS: Normal heart size. Status post median sternotomy for mitral and aortic valve replacement. Low lung volumes. There is no edema, consolidation, effusion, or pneumothorax. IMPRESSION: Low volume chest without acute finding. Electronically Signed   By: Monte Fantasia M.D.   On: 12/17/2017 21:00   Dg Chest 2 View  Result Date: 11/23/2017 CLINICAL DATA:  Pleural effusion EXAM: CHEST  2 VIEW COMPARISON:  11/21/2017 FINDINGS: Moderate cardiomegaly. Vascular congestion. Low volumes. Bibasilar atelectasis. Small pleural effusions. Postoperative changes from aortic and mitral valve replacement IMPRESSION: Cardiomegaly and vascular congestion. Bibasilar atelectasis. Small  bilateral pleural effusions Stable. Electronically Signed   By: Marybelle Killings M.D.   On: 11/23/2017 11:04   Ct Head Wo Contrast  Result Date: 12/17/2017 CLINICAL DATA:  Weakness for 4 days, altered mental status and lethargy. History of hypertension, hypercholesterolemia and COPD. EXAM: CT HEAD WITHOUT CONTRAST TECHNIQUE: Contiguous axial images were obtained from the base of the skull through the vertex without intravenous contrast. COMPARISON:  None. FINDINGS: BRAIN: No intraparenchymal hemorrhage, mass effect nor midline shift. The ventricles and sulci are normal for age. Minimal supratentorial white matter hypodensities less than expected for patient's age, though non-specific are most compatible with chronic small vessel ischemic disease. No acute large vascular territory infarcts. No abnormal extra-axial fluid collections. Basal cisterns are patent. VASCULAR: Mild calcific atherosclerosis of the carotid siphons. SKULL: No skull fracture. No significant scalp soft tissue swelling. SINUSES/ORBITS: The mastoid air-cells and included paranasal sinuses are well-aerated.The included ocular globes and orbital contents are non-suspicious. OTHER: None. IMPRESSION: Normal noncontrast CT HEAD for age. Electronically Signed   By: Elon Alas M.D.   On: 12/17/2017 21:11   Dg Chest Port 1 View  Result Date: 11/21/2017 CLINICAL DATA:  Encounter for chest tube placement. EXAM: PORTABLE CHEST 1 VIEW COMPARISON:  11/20/2017. FINDINGS: Right PICC line in unchanged position with its tip over the right atrium. Prior cardiac valve replacements. Heart size stable. Interim improvement of aeration of both lungs with clearing of interstitial prominence suggesting clearing interstitial edema. Mild right base atelectasis. No pneumothorax. No chest tube identified. IMPRESSION: 1. Right PICC line in unchanged position with tip over the right atrium. 2. Prior cardiac valve replacements. Heart size stable. Interim renal  pulmonary interstitial prominence suggesting clearing pulmonary interstitial edema. Mild right base atelectasis. Electronically Signed   By: Marcello Moores  Register   On: 11/21/2017 07:11   Dg Chest Port 1 View  Result Date: 11/20/2017 CLINICAL DATA:  67 year old female postoperative day 8 status post aortic and mitral valve replacement. EXAM: PORTABLE CHEST 1 VIEW COMPARISON:  11/17/2017 and earlier. FINDINGS: Portable AP semi upright view at 0613 hours. Right IJ introducer sheath removed. Stable right PICC line. Stable cardiac size and mediastinal contours. Prosthetic mitral and aortic valves. Continued veiling opacity at the right lung base. No pulmonary edema. No pneumothorax. Epicardial pacer wires in place. Allowing for portable technique the left lung is clear. IMPRESSION: 1. Right IJ introducer sheath removed.  Right PICC line remains. 2. Suspect small right pleural effusion with atelectasis. No other acute cardiopulmonary abnormality. Electronically Signed   By: Genevie Ann M.D.   On: 11/20/2017 08:11    Microbiology: No results found for this or any previous visit (from the past 240 hour(s)).   Labs: Basic Metabolic Panel: Recent Labs  Lab 12/15/17 1612 12/17/17 2005 12/18/17 0430  NA  136 134* 139  K 4.8 5.1 4.5  CL 100 103 107  CO2 22 21* 22  GLUCOSE 295* 203* 160*  BUN 16 15 16   CREATININE 1.15* 1.03* 0.97  CALCIUM 8.7 8.8* 8.9   Liver Function Tests: Recent Labs  Lab 12/15/17 1612 12/17/17 2005 12/18/17 0430  AST 104* 108* 92*  ALT 53* 57* 51  ALKPHOS 237* 206* 183*  BILITOT 3.0* 3.5* 3.6*  PROT 6.4 6.4* 5.8*  ALBUMIN 2.9* 2.6* 2.3*   No results for input(s): LIPASE, AMYLASE in the last 168 hours. Recent Labs  Lab 12/17/17 2000 12/19/17 0439  AMMONIA 204* 60*   CBC: Recent Labs  Lab 12/15/17 1612 12/17/17 2005  WBC 6.5 7.2  NEUTROABS  --  4.2  HGB 12.0 11.9*  HCT 36.5 37.9  MCV 87 90.2  PLT 155 123*   Cardiac Enzymes: No results for input(s): CKTOTAL,  CKMB, CKMBINDEX, TROPONINI in the last 168 hours. BNP: BNP (last 3 results) Recent Labs    10/15/17 1304 10/15/17 1843  BNP 125.3* 146.1*    ProBNP (last 3 results) No results for input(s): PROBNP in the last 8760 hours.  CBG: Recent Labs  Lab 12/18/17 1835 12/19/17 0043 12/19/17 0209 12/19/17 0620 12/19/17 1125  GLUCAP 127* 280* 263* 233* 298*       Signed:  Lelon Frohlich  [facility] Pager: [phone] 12/19/2017, 5:45 PM

## 2017-12-23 ENCOUNTER — Telehealth: Payer: Self-pay | Admitting: *Deleted

## 2017-12-23 ENCOUNTER — Other Ambulatory Visit: Payer: Medicare Other | Admitting: *Deleted

## 2017-12-23 DIAGNOSIS — E785 Hyperlipidemia, unspecified: Secondary | ICD-10-CM

## 2017-12-23 NOTE — Telephone Encounter (Signed)
Pt here today for lab work and asked to speak with nurse regarding vomiting she has been having.   Pt reports she has vomited several times today.  Not currently vomiting. States similar symptoms prior to recent hospitalization. Hospitalized from 1/9-1/11 with hepatic encephalopathy.  Pt reports she has not taken lactulose since yesterday.  Pt is alert, oriented and answering questions appropriately.  Husband and daughter are with her.  Reviewed with Dr. Tamala Julian (DOD) and pt needs to see primary care for current issues.  I spoke with pt and advised her to contact primary care for evaluation today.  I told pt if she was unable to get appointment in primary care today she should go to Urgent Care or ED.  Husband also given these instructions.

## 2017-12-24 LAB — COMPREHENSIVE METABOLIC PANEL
ALBUMIN: 3 g/dL — AB (ref 3.6–4.8)
ALK PHOS: 243 IU/L — AB (ref 39–117)
ALT: 63 IU/L — ABNORMAL HIGH (ref 0–32)
AST: 125 IU/L — AB (ref 0–40)
Albumin/Globulin Ratio: 0.9 — ABNORMAL LOW (ref 1.2–2.2)
BILIRUBIN TOTAL: 2.9 mg/dL — AB (ref 0.0–1.2)
BUN / CREAT RATIO: 13 (ref 12–28)
BUN: 12 mg/dL (ref 8–27)
CHLORIDE: 96 mmol/L (ref 96–106)
CO2: 19 mmol/L — AB (ref 20–29)
CREATININE: 0.94 mg/dL (ref 0.57–1.00)
Calcium: 8.6 mg/dL — ABNORMAL LOW (ref 8.7–10.3)
GFR calc Af Amer: 74 mL/min/{1.73_m2} (ref >59)
GFR calc non Af Amer: 64 mL/min/{1.73_m2} (ref >59)
Globulin, Total: 3.5 g/dL (ref 1.5–4.5)
Glucose: 251 mg/dL — ABNORMAL HIGH (ref 65–99)
Potassium: 4.3 mmol/L (ref 3.5–5.2)
SODIUM: 135 mmol/L (ref 134–144)
Total Protein: 6.5 g/dL (ref 6.0–8.5)

## 2017-12-25 ENCOUNTER — Other Ambulatory Visit: Payer: Self-pay | Admitting: Cardiothoracic Surgery

## 2017-12-25 DIAGNOSIS — I25118 Atherosclerotic heart disease of native coronary artery with other forms of angina pectoris: Secondary | ICD-10-CM

## 2017-12-29 ENCOUNTER — Ambulatory Visit: Payer: Self-pay

## 2018-01-01 ENCOUNTER — Other Ambulatory Visit: Payer: Self-pay

## 2018-01-01 ENCOUNTER — Encounter: Payer: Self-pay | Admitting: Physician Assistant

## 2018-01-01 ENCOUNTER — Ambulatory Visit
Admission: RE | Admit: 2018-01-01 | Discharge: 2018-01-01 | Disposition: A | Discharge status: Home / Self Care | Payer: Medicare Other | Source: Ambulatory Visit | Attending: Cardiothoracic Surgery | Admitting: Cardiothoracic Surgery

## 2018-01-01 ENCOUNTER — Ambulatory Visit (INDEPENDENT_AMBULATORY_CARE_PROVIDER_SITE_OTHER): Payer: Medicare Other | Admitting: Physician Assistant

## 2018-01-01 VITALS — BP 125/64 | HR 97 | Resp 18 | Ht 62.0 in | Wt 153.6 lb

## 2018-01-01 DIAGNOSIS — Z952 Presence of prosthetic heart valve: Secondary | ICD-10-CM | POA: Diagnosis not present

## 2018-01-01 DIAGNOSIS — I25118 Atherosclerotic heart disease of native coronary artery with other forms of angina pectoris: Secondary | ICD-10-CM

## 2018-01-01 NOTE — Patient Instructions (Signed)
Make every effort to keep your diabetes under very tight control.  Follow up closely with your primary care physician or endocrinologist and strive to keep their hemoglobin A1c levels as low as possible, preferably near or below 6.0.  The long term benefits of strict control of diabetes are far reaching and critically important for your overall health and survival.  You may return to driving an automobile as long as you are no longer requiring oral narcotic pain relievers during the daytime.  It would be wise to start driving only short distances during the daylight and gradually increase from there as you feel comfortable.  Make every effort to stay physically active, get some type of exercise on a regular basis, and stick to a "heart healthy diet".  The long term benefits for regular exercise and a healthy diet are critically important to your overall health and wellbeing.  Endocarditis is a potentially serious infection of heart valves or inside lining of the heart.  It occurs more commonly in patients with diseased heart valves (such as patient's with aortic or mitral valve disease) and in patients who have undergone heart valve repair or replacement.  Certain surgical and dental procedures may put you at risk, such as dental cleaning, other dental procedures, or any surgery involving the respiratory, urinary, gastrointestinal tract, gallbladder or prostate gland.   To minimize your chances for develooping endocarditis, maintain good oral health and seek prompt medical attention for any infections involving the mouth, teeth, gums, skin or urinary tract.    Always notify your doctor or dentist about your underlying heart valve condition before having any invasive procedures. You will need to take antibiotics before certain procedures, including all routine dental cleanings or other dental procedures.  Your cardiologist or dentist should prescribe these antibiotics for you to be taken ahead of  time.

## 2018-01-01 NOTE — Progress Notes (Signed)
Denise Cuevas is a 67 y.o. female patient status post aortic valve replacement and mitral valve replacement on 11/12/2017 with Dr. Servando Snare.  Her postoperative course was overall uncomplicated. She was readmitted on December 17, 2017 for increased confusion and lethargy.  She does have a history of cirrhosis and her ammonia level was 200 on admission.  Once her ammonia level was down to 60 she was discharged.  It was recommended that she follow-up with her primary care provider.  She remains on Coumadin for valve thrombus prophylaxis and her last INR was 1.62 on January 11.   1. S/P aortic valve and mitral valve replacement    Past Medical History:  Diagnosis Date  . Angina   . Asthma   . CHF (congestive heart failure) ([place])   . Cirrhosis of liver ([place]) 08/2014   idiopathic  . COPD (chronic obstructive pulmonary disease) ([place])   . Coronary artery disease    s/p CABG  . Depression   . Diabetes mellitus    type 2  . Dyspnea   . Edema extremities    consistent edema lower legs, feet, and right quadrant abdominal pain-"goes and comes"  . Family history of adverse reaction to anesthesia    SISTER HAS NAUSEA  . Fibromyalgia   . GERD (gastroesophageal reflux disease)   . H/O hiatal hernia   . Headache(784.0)    occ. generalized  . Heart murmur   . Hypercholesteremia   . Hypertension   . Hypothyroid   . Myocardial infarction [facility]) 11/2011   MI x2- 0'10,2'72 coronary stent(chest pain episode at time)  . PONV (postoperative nausea and vomiting)    No past surgical history pertinent negatives on file. Scheduled Meds: Current Outpatient Medications on File Prior to Visit  Medication Sig Dispense Refill  . albuterol (PROVENTIL HFA;VENTOLIN HFA) 108 (90 BASE) MCG/ACT inhaler Inhale 2 puffs into the lungs every 6 (six) hours as needed for wheezing or shortness of breath. 1 Inhaler 2  . albuterol (PROVENTIL) (2.5 MG/3ML) 0.083% nebulizer solution Take 3 mLs (2.5 mg total) by nebulization  every 2 (two) hours as needed for wheezing. 75 mL 0  . aspirin 81 MG tablet Take 81 mg by mouth daily.    . DULoxetine (CYMBALTA) 30 MG capsule Take 30 mg by mouth daily with lunch. In conjunction with one 60 mg capsule to equal a total of 90 milligrams    . DULoxetine (CYMBALTA) 60 MG capsule Take 60 mg by mouth daily with lunch. In conjunction with one 30 mg capsule to equal a total of 90 milligrams    . furosemide (LASIX) 40 MG tablet Take 1 tablet (40 mg total) by mouth 2 (two) times daily. For one week;then take Lasix 40 mg by mouth daily thereafter (Patient taking differently: Take 40 mg by mouth daily. ) 30 tablet 1  . HUMALOG MIX 75/25 KWIKPEN (75-25) 100 UNIT/ML Kwikpen INJECT 60 UNITS SUBCUTANEOUSLY BEFORE BREAKFAST AND 60 UNITS BEFORE EVENING MEAL  10  . JARDIANCE 25 MG TABS tablet Take 25 mg daily by mouth.  5  . lactulose (CHRONULAC) 10 GM/15ML solution Take 45 mLs (30 g total) by mouth 3 (three) times daily. 240 mL 0  . levothyroxine (SYNTHROID, LEVOTHROID) 50 MCG tablet Take 1 tablet (50 mcg total) by mouth daily before breakfast. 30 tablet 0  . mometasone-formoterol (DULERA) 100-5 MCG/ACT AERO Inhale 2 puffs into the lungs 2 (two) times daily as needed for wheezing or shortness of breath.    Marland Kitchen omeprazole-sodium  bicarbonate (ZEGERID) 40-1100 MG per capsule Take 1 capsule by mouth daily before breakfast.    . spironolactone (ALDACTONE) 25 MG tablet Take 1 tablet (25 mg total) by mouth daily. 30 tablet 0  . traMADol (ULTRAM) 50 MG tablet Take 1 tablet (50 mg total) by mouth every 6 (six) hours as needed for moderate pain. 30 tablet 0  . warfarin (COUMADIN) 1 MG tablet Take 1 tablet (1 mg total) by mouth daily at 6 PM. Or as directed. (Patient taking differently: Take 1-1.5 mg by mouth See admin instructions. 1.5mg  on MWF and 1mg  on all other days) 30 tablet 1   No current facility-administered medications on file prior to visit.     Allergies  Allergen Reactions  . Exenatide Nausea  And Vomiting  . Ace Inhibitors Other (See Comments)    pseudoasthma    Blood pressure 125/64, pulse 97, resp. rate 18, height 5\' 2"  (1.575 m), weight 153 lb 9.6 oz (69.7 kg), SpO2 97 %.  Subjective: She feels poorly today since she has been vomiting almost everyday. She is getting around okay since surgery.   Objective: Cor:RRR, no murmur Pulm: CTA bilaterally in all fields Abd: no tenderness Wound: c/d/i without drainage Ext: no edema    CLINICAL DATA:  Status post coronary bypass grafting with shortness of Breath  EXAM: CHEST  2 VIEW  COMPARISON:  1/9/9  FINDINGS: Cardiac shadow is within normal limits. Postsurgical changes are again seen and stable. Elevation the right hemidiaphragm is noted. Small right-sided pleural effusion is seen. There is likely some underlying atelectasis/early infiltrate. The left lung is clear. No bony abnormality is noted.  IMPRESSION: Small right-sided pleural effusion with right basilar changes.   Electronically Signed   By: Inez Catalina M.D.   On: 01/01/2018 13:58   Assessment & Plan  Denise Cuevas presents today for her routine follow-up visit.  From a surgical standpoint she is recovering well.  Her incisions are well-healed.  She is not having much pain.  Although, from a health perspective her recovery has been quite poor.  She was readmitted earlier this month for her cirrhosis and was found to have an ammonia level of over 200.  She was then treated with lactulose and continues to take lactulose daily.  However, she is still having episodes of vomiting and is unable to eat much other than popsicles.  Vomiting then create soreness over her chest incision.  This is really been her main obstacle since surgery.  She also has a right hemidiaphragm on her chest x-ray today.  I discussed the x-ray with Dr. Servando Snare and he also saw the patient.  When she was admitted to [facility] they did not schedule follow-up for pro time/INR draws since  she is on Coumadin.  Her last INR was while she was in the hospital on January 11.  I have entered an order today re-ordering home health since that was also dropped on her last admission to draw her INR and send it to Dr. [name] office.  Her next appointment with Dr. [name] was not until March which for complex patient like Denise Cuevas is too far out.  We have contacted the office and  Rescheduled her appointment to the next few weeks.  She also did not have an appointment with gastroenterologist set up for her advanced cirrhosis.  Since this was the reason for her admission a few weeks ago but I believe that follow-up is appropriate.  We have also arranged a 6-week  follow-up appointment with our office to ensure that these things have been taken care of and that she is feeling better.  She is to continue her lactulose, acid reflux medications, and diabetes medications.  She is to continue her Lasix once a day and her spironolactone. She can call our office with questions or concerns.     Elgie Collard 01/01/2018

## 2018-01-02 ENCOUNTER — Ambulatory Visit (INDEPENDENT_AMBULATORY_CARE_PROVIDER_SITE_OTHER): Payer: Medicare Other | Admitting: Pharmacist

## 2018-01-02 DIAGNOSIS — Z952 Presence of prosthetic heart valve: Secondary | ICD-10-CM | POA: Diagnosis not present

## 2018-01-02 DIAGNOSIS — R011 Cardiac murmur, unspecified: Secondary | ICD-10-CM

## 2018-01-02 DIAGNOSIS — I080 Rheumatic disorders of both mitral and aortic valves: Secondary | ICD-10-CM | POA: Diagnosis not present

## 2018-01-02 DIAGNOSIS — I050 Rheumatic mitral stenosis: Secondary | ICD-10-CM

## 2018-01-02 LAB — POCT INR: INR: 1.4

## 2018-01-09 ENCOUNTER — Ambulatory Visit (INDEPENDENT_AMBULATORY_CARE_PROVIDER_SITE_OTHER): Payer: Medicare Other | Admitting: Pharmacist

## 2018-01-09 DIAGNOSIS — I080 Rheumatic disorders of both mitral and aortic valves: Secondary | ICD-10-CM

## 2018-01-09 DIAGNOSIS — R011 Cardiac murmur, unspecified: Secondary | ICD-10-CM

## 2018-01-09 DIAGNOSIS — Z952 Presence of prosthetic heart valve: Secondary | ICD-10-CM

## 2018-01-09 DIAGNOSIS — I050 Rheumatic mitral stenosis: Secondary | ICD-10-CM | POA: Diagnosis not present

## 2018-01-09 LAB — POCT INR: INR: 1.5

## 2018-01-09 NOTE — Patient Instructions (Signed)
Description   Start taking 1 tablet daily except 2 tablets on Mondays, Wednesdays and Fridays. Recheck INR in 1 week.  #[phone] [facility] [phone] Main [phone]

## 2018-01-15 ENCOUNTER — Ambulatory Visit (INDEPENDENT_AMBULATORY_CARE_PROVIDER_SITE_OTHER): Payer: Medicare Other | Admitting: Cardiology

## 2018-01-15 ENCOUNTER — Encounter: Payer: Self-pay | Admitting: Cardiology

## 2018-01-15 ENCOUNTER — Ambulatory Visit (INDEPENDENT_AMBULATORY_CARE_PROVIDER_SITE_OTHER): Payer: Medicare Other | Admitting: *Deleted

## 2018-01-15 VITALS — BP 126/60 | HR 86 | Ht 62.0 in | Wt 160.1 lb

## 2018-01-15 DIAGNOSIS — R011 Cardiac murmur, unspecified: Secondary | ICD-10-CM | POA: Diagnosis not present

## 2018-01-15 DIAGNOSIS — I080 Rheumatic disorders of both mitral and aortic valves: Secondary | ICD-10-CM

## 2018-01-15 DIAGNOSIS — I2510 Atherosclerotic heart disease of native coronary artery without angina pectoris: Secondary | ICD-10-CM

## 2018-01-15 DIAGNOSIS — I050 Rheumatic mitral stenosis: Secondary | ICD-10-CM | POA: Diagnosis not present

## 2018-01-15 DIAGNOSIS — Z952 Presence of prosthetic heart valve: Secondary | ICD-10-CM

## 2018-01-15 LAB — POCT INR: INR: 1.4

## 2018-01-15 NOTE — Patient Instructions (Signed)
Medication Instructions:  Your physician recommends that you continue on your current medications as directed. Please refer to the Current Medication list given to you today.  Labwork: None Ordered   Testing/Procedures: None Ordered   Follow-Up: Your have a follow up appointment with Otila Kluver, [place] at [facility] on 01/19/18 at 3:15PM  Your provider would like you to keep your appointment with Dr. [name] on 02/19/18.    Any Other Special Instructions Will Be Listed Below (If Applicable).     If you need a refill on your cardiac medications before your next appointment, please call your pharmacy.

## 2018-01-15 NOTE — Patient Instructions (Signed)
Description   Today take 2 tablets then start taking 2 tablets daily except 1 tablet on Sundays and Thursdays.  Recheck INR in 1 week.  #[phone] [facility] [phone] Main [phone]

## 2018-01-15 NOTE — Progress Notes (Signed)
01/15/2018 Denise Cuevas   03-23-1951  [id number]  Primary Physician [name], MD Primary Cardiologist: Dr. [name] CT Surgeon: Dr. Servando Snare  Gastroenterologist: Dr. Paulita Fujita  Reason for Visit/CC: f/u for CAD and Valvular Disease  HPI:  Denise Cuevas a 67 y.o.femalewho is being seen today for f/u for CAD, valvular disease and recent open heart surgery. She is followed by Dr. [name] and has a h/o CAD s/p CABG in 2012 w/LIMA to LAD, SVG to OM withsubsequentDES to distal anastomosis SVG OM and distal LAD in 2013. Also with a h/o valvular disease, non alcoholic cirrhosis and asthma.   She was admitted 10/15/17 for evaluation for cough, dyspnea, weight gain and intermitted CP. She was found to be in acute CHF and was treated with IV diuretics. Symptoms improved with diuresis. 2D echo was obtained 10/16/2017 which showed normal LVEF at 60-65%, moderate AS and moderate MR. This was followed by TEE which also confirmed significant valvular disease, moderate AS with mild AI and moderate to severe MS. R/LHC was conducted on 10/20/17 to reassess coronary anatomy as it was felt that she would likely need valve surgery. Cath showed double vessel CAD s/p 2V CABG with 1/2 patent bypass grafts. LIMA is patent.The stent in the distal LAD is patent without restenosis. The Circumflex artery is patent. There are patent stents in the proximal Circumflex and distal segment of the obtuse marginal branch. The proximal segment of the OM branch prior to the stent has a moderate non-obstructive stenosis. The vein graft to the OM branch is known to be occluded and was not selectively engaged.  Dr. Servando Snare, who performed her CABG in 2012 was consulted during her hospitalization. He recommended redo sternotomy for both AVR and MVR. She was discharged home and returned back several weeks later for valve replacement. Surgery was performed on 11/12/17. She selected pericardia tissue valves.   Post operative course  was complicated by atrial fibrillation w/ RVR and she required amiodarone. Shewas volume over loaded and diuresed. Shehad ABL anemia. Last H and H was 8.7.She also had thrombocytopenia.Her last platelet count was63.HIT was checked and wasnegative.She was started on Coumadin. Also of note, Lopressor was stopped secondary to labile BP. She was restarted on Spironolactone which was taken pre operatively. Amiodarone was decreased to 200 mg daily prior to discharge. However this was ultimately discontinued after pt developed acute liver injury/cirrhosis and hepatic encephalopathy, requiring hospital redamission. Her ammonia level was over 200 and had improved down to 60 day of discharge. Pt now on lactulose. Her Gastroenterologist continues to follow this. From a surgical standpoint, she is doing well. She was seen by CT surgery on 01/01/18 and noted to be doing well.   She is back today with her husband. In a wheel chair and appears very weak and frail. She is scheduled for a coumadin visit today for INR check.  Her main complaints continue to be GI related, likely secondary to her cirrhosis.  She constantly stays nauseated and continues to have some vomiting.  She notes poor p.o. intake and is unable to keep foods down.  She reports full compliance with her lactulose.  She is unsure when her next appointment with her gastroenterologist is.  From a cardiac standpoint she denies dyspnea.  No chest pain.  No abnormal bleeding with Coumadin.  Current Meds  Medication Sig  . albuterol (PROVENTIL HFA;VENTOLIN HFA) 108 (90 BASE) MCG/ACT inhaler Inhale 2 puffs into the lungs every 6 (six) hours as needed for wheezing or  shortness of breath.  Marland Kitchen albuterol (PROVENTIL) (2.5 MG/3ML) 0.083% nebulizer solution Take 3 mLs (2.5 mg total) by nebulization every 2 (two) hours as needed for wheezing.  Marland Kitchen aspirin 81 MG tablet Take 81 mg by mouth daily.  . DULoxetine (CYMBALTA) 30 MG capsule Take 30 mg by mouth daily  with lunch. In conjunction with one 60 mg capsule to equal a total of 90 milligrams  . DULoxetine (CYMBALTA) 60 MG capsule Take 60 mg by mouth daily with lunch. In conjunction with one 30 mg capsule to equal a total of 90 milligrams  . furosemide (LASIX) 40 MG tablet Take 40 mg by mouth daily.  Marland Kitchen HUMALOG MIX 75/25 KWIKPEN (75-25) 100 UNIT/ML Kwikpen INJECT 60 UNITS SUBCUTANEOUSLY BEFORE BREAKFAST AND 60 UNITS BEFORE EVENING MEAL  . JARDIANCE 25 MG TABS tablet Take 25 mg daily by mouth.  Marland Kitchen KRISTALOSE 20 g packet Take 20 g by mouth 3 (three) times daily.  Marland Kitchen levothyroxine (SYNTHROID, LEVOTHROID) 50 MCG tablet Take 1 tablet (50 mcg total) by mouth daily before breakfast.  . mometasone-formoterol (DULERA) 100-5 MCG/ACT AERO Inhale 2 puffs into the lungs 2 (two) times daily as needed for wheezing or shortness of breath.  Marland Kitchen omeprazole-sodium bicarbonate (ZEGERID) 40-1100 MG per capsule Take 1 capsule by mouth daily before breakfast.  . spironolactone (ALDACTONE) 25 MG tablet Take 1 tablet (25 mg total) by mouth daily.  . traMADol (ULTRAM) 50 MG tablet Take 1 tablet (50 mg total) by mouth every 6 (six) hours as needed for moderate pain.  Marland Kitchen warfarin (COUMADIN) 1 MG tablet Take 1 tablet (1 mg total) by mouth daily at 6 PM. Or as directed. (Patient taking differently: Take 1-1.5 mg by mouth See admin instructions. 1.5mg  on MWF and 1mg  on all other days)   Allergies  Allergen Reactions  . Exenatide Nausea And Vomiting  . Ace Inhibitors Other (See Comments)    pseudoasthma   Past Medical History:  Diagnosis Date  . Angina   . Asthma   . CHF (congestive heart failure) ([place])   . Cirrhosis of liver ([place]) 08/2014   idiopathic  . COPD (chronic obstructive pulmonary disease) ([place])   . Coronary artery disease    s/p CABG  . Depression   . Diabetes mellitus    type 2  . Dyspnea   . Edema extremities    consistent edema lower legs, feet, and right quadrant abdominal pain-"goes and comes"  . Family  history of adverse reaction to anesthesia    SISTER HAS NAUSEA  . Fibromyalgia   . GERD (gastroesophageal reflux disease)   . H/O hiatal hernia   . Headache(784.0)    occ. generalized  . Heart murmur   . Hypercholesteremia   . Hypertension   . Hypothyroid   . Myocardial infarction [facility]) 11/2011   MI x2- 2'50,5'39 coronary stent(chest pain episode at time)  . PONV (postoperative nausea and vomiting)    Family History  Problem Relation Age of Onset  . Heart disease Father 34       died of MI  . Emphysema Father        smoked  . Peripheral vascular disease Mother 33       bilaterial amputations   Past Surgical History:  Procedure Laterality Date  . AORTIC VALVE REPLACEMENT N/A 11/12/2017   Procedure: AORTIC VALVE REPLACEMENT (AVR)  USING 19 MM MAGNA EASE PERICARDIAL BIOPROSTHESIS-AORTIC, MODEL 3300TFX;  Surgeon: [name], MD;  Location: [place];  Service: Open Heart Surgery;  Laterality: N/A;  . ARTERIAL LINE INSERTION Right 11/12/2017   Procedure: ARTERIAL LINE INSERTION;  Surgeon: [name], MD;  Location: [place];  Service: Open Heart Surgery;  Laterality: Right;  placed in right femoral artery.  . CHOLECYSTECTOMY     open many yrs ago  . COLONOSCOPY WITH PROPOFOL N/A 08/24/2014   Procedure: COLONOSCOPY WITH PROPOFOL;  Surgeon: Arta Silence, MD;  Location: [facility];  Service: Endoscopy;  Laterality: N/A;  . CORONARY ANGIOPLASTY WITH STENT PLACEMENT  05/06/2012  . CORONARY ARTERY BYPASS GRAFT  11/27/2011   Procedure: CORONARY ARTERY BYPASS GRAFTING (CABG);  Surgeon: [name], MD;  Location: [place];  Service: Open Heart Surgery;  Laterality: N/A;  coronary artery bypass graft on pump times two utilizing left internal mammary artery and right saphenous vein harvested endoscopically   . ESOPHAGOGASTRODUODENOSCOPY (EGD) WITH PROPOFOL N/A 08/24/2014   Procedure: ESOPHAGOGASTRODUODENOSCOPY (EGD) WITH PROPOFOL;  Surgeon: Arta Silence, MD;  Location: [facility];  Service: Endoscopy;  Laterality: N/A;  . KNEE SURGERY Right    scope surgery  . LEFT HEART CATHETERIZATION WITH CORONARY ANGIOGRAM N/A 11/19/2011   Procedure: LEFT HEART CATHETERIZATION WITH CORONARY ANGIOGRAM;  Surgeon: Candee Furbish, MD;  Location[facility] C[facility] LAB;  Service: Cardiovascular;  Laterality: N/A;  . LEFT HEART CATHETERIZATION WITH CORONARY ANGIOGRAM  05/06/2012   Procedure: LEFT HEART CATHETERIZATION WITH CORONARY ANGIOGRAM;  Surgeon: Jettie Booze, MD;  Location: [facility] CATH LAB;  Service: Cardiovascular;;  . LEFT HEART CATHETERIZATION WITH CORONARY/GRAFT ANGIOGRAM N/A 03/24/2012   Procedure: LEFT HEART CATHETERIZATION WITH Beatrix Fetters;  Surgeon: Jettie Booze, MD;  Location: [facility] CATH LAB;  Service: Cardiovascular;  Laterality: N/A;  . MITRAL VALVE REPLACEMENT N/A 11/12/2017   Procedure: MITRAL VALVE (MV) REPLACEMENT USING 25 MM MAGNA MITRAL EASE PERICARDIAL BIOPROSTHESIS, MODEL 7300TFX;  Surgeon: [name], MD;  Location: [place];  Service: Open Heart Surgery;  Laterality: N/A;  Using 64mm Magna Ease Mitral Pericardial Bioprosthesis  . PERCUTANEOUS CORONARY STENT INTERVENTION (PCI-S)  03/24/2012   Procedure: PERCUTANEOUS CORONARY STENT INTERVENTION (PCI-S);  Surgeon: Jettie Booze, MD;  Location: [facility] CATH LAB;  Service: Cardiovascular;;  . PERCUTANEOUS CORONARY STENT INTERVENTION (PCI-S) Bilateral 05/06/2012   Procedure: PERCUTANEOUS CORONARY STENT INTERVENTION (PCI-S);  Surgeon: Jettie Booze, MD;  Location: [facility] CATH LAB;  Service: Cardiovascular;  Laterality: Bilateral;  . RIGHT/LEFT HEART CATH AND CORONARY/GRAFT ANGIOGRAPHY N/A 10/20/2017   Procedure: RIGHT/LEFT HEART CATH AND CORONARY/GRAFT ANGIOGRAPHY;  Surgeon: Burnell Blanks, MD;  Location: [place] CV LAB;  Service: Cardiovascular;  Laterality: N/A;  . TEE WITHOUT CARDIOVERSION N/A 10/20/2017   Procedure: TRANSESOPHAGEAL ECHOCARDIOGRAM (TEE);  Surgeon: Dorothy Spark, MD;   Location: [facility] ENDOSCOPY;  Service: Cardiovascular;  Laterality: N/A;  . TEE WITHOUT CARDIOVERSION N/A 11/12/2017   Procedure: TRANSESOPHAGEAL ECHOCARDIOGRAM (TEE);  Surgeon: [name], MD;  Location: [place];  Service: Open Heart Surgery;  Laterality: N/A;   Social History   Socioeconomic History  . Marital status: Married    Spouse name: Not on file  . Number of children: Not on file  . Years of education: Not on file  . Highest education level: Not on file  Social Needs  . Financial resource strain: Not on file  . Food insecurity - worry: Not on file  . Food insecurity - inability: Not on file  . Transportation needs - medical: Not on file  . Transportation needs - non-medical: Not on file  Occupational History  . Occupation: Retired Youth worker  admin  Tobacco Use  . Smoking status: Never Smoker  . Smokeless tobacco: Never Used  Substance and Sexual Activity  . Alcohol use: No  . Drug use: No  . Sexual activity: Yes  Other Topics Concern  . Not on file  Social History Narrative   Retired Naval architect           Review of Systems: General: negative for chills, fever, night sweats or weight changes.  Cardiovascular: negative for chest pain, dyspnea on exertion, edema, orthopnea, palpitations, paroxysmal nocturnal dyspnea or shortness of breath Dermatological: negative for rash Respiratory: negative for cough or wheezing Urologic: negative for hematuria Abdominal: negative for nausea, vomiting, diarrhea, bright red blood per rectum, melena, or hematemesis Neurologic: negative for visual changes, syncope, or dizziness All other systems reviewed and are otherwise negative except as noted above.   Physical Exam:  Blood pressure 126/60, pulse 86, height 5\' 2"  (1.575 m), weight 160 lb 1.9 oz (72.6 kg).  General appearance: alert, cooperative, no distress and frail appearing Neck: no carotid bruit and no JVD Lungs: clear to auscultation bilaterally Heart: regular  rate and rhythm and 2/6 murmur Extremities: trace bilateral LEE Pulses: 2+ and symmetric Skin: Skin color, texture, turgor normal. No rashes or lesions Neurologic: Grossly normal  EKG not performed RRR on exam -- personally reviewed   ASSESSMENT AND PLAN:   1. S/p AVR and MVR with Tissue Valves: no CP or dyspnea. Recently seen by CT surgery and noted to be doing well from a surgical standpoint.  2. Post-op Atrial Fibrillation: last office EKG post op showed NSR. RRR on exam today. Amio was discontinued due to worsening liver function/cirrhosis. She remains on coumadin for post tissue valve protocol. I've discussed case with pharmacist in coumadin clinic and standard protocol for tissue valve is coumadin x 3 months. Given cirrhosis and bleed risk, will likely need to discontinue after 3 months of therapy is completed. Will also defer to Dr. [name]. She has f/u with him next month.   3. CAD: h/o CABG. No indication for re-do bypass with recent redostenotomy for valve replacement. Denies CP. Continue medical therapy.   4. HTN: controlled on current regimen. Metoprolol was discontinued during recent hospitalization due to labile BP.  5. Coumadin Therapy: INR followed in our coumadin clinic. See #2. She denies any abnormal bleeding.   6. Cirrhosis: followed by Dr. Paulita Fujita. Pt reports compliance with Lactulose but continues to have persistent nausea and vomiting and poor PO intake. She and her husband also have multiple questions regarding her meds for cirrhosis. We will call Dr. Erlinda Hong office to try to arrange f/u with him or an APP this week.    Follow-Up: continue current cardiac regimen. Keep f/u with Dr. [name] next month.   Elpidia Karn Ladoris Gene, MHS [facility] 01/15/2018 11:13 AM

## 2018-01-18 ENCOUNTER — Other Ambulatory Visit: Payer: Self-pay | Admitting: Physician Assistant

## 2018-01-20 ENCOUNTER — Telehealth: Payer: Self-pay | Admitting: Cardiology

## 2018-01-20 ENCOUNTER — Telehealth: Payer: Self-pay | Admitting: *Deleted

## 2018-01-20 NOTE — Telephone Encounter (Signed)
Left message on VM to c/b to discuss recommendations/orders.

## 2018-01-20 NOTE — Telephone Encounter (Addendum)
Pt called to inform us that she is having bleeding from her vagina. Advised pt that since she is having bright red blood from vagina to get checked out since this is usual for her. Pt stated she just needs to ask her doctor or PA to stop the Coumadin. Pt states she will not go to another doctor until she speaks with a doctor at our facility & she stated transfer me back to the main line to get a message to [place] or [place]. Again, insisisted pt get checked out for the vaginal bleeding as this is important and she said ok but transfer me back to the main line. Pt was transferred to main line as requested & advised to not wait on bleeding from vagina as this is urgent.

## 2018-01-20 NOTE — Telephone Encounter (Signed)
New Message    Pt c/o medication issue:  1. Name of Medication:   warfarin (COUMADIN) 1 MG tablet Take 1 tablet (1 mg total) by mouth daily at 6 PM. Or as directed. Patient taking differently: Take 1-1.5 mg by mouth See admin instructions. 1.5mg  on MWF and 1mg  on all other days     2. How are you currently taking this medication (dosage and times per day)? 1-1.5mg   3. Are you having a reaction (difficulty breathing--STAT)? yes  4. What is your medication issue? Patient is having vaginal bleeding and bruising    Spoke with Doroteo Bradford in coumadin she said patient needs to call PCP, patient still wants you to call her

## 2018-01-20 NOTE — Telephone Encounter (Signed)
Since she is having bleeding, I would recommend stopping the coumadin.  Agree with GYN evaluation as well.  Her liver disease, decreased platelet function puts her at increased further bleeding risk.  She is now past 2 months post valve replacement (bioprosthetic) and normally we would continue coumadin until 3 months out but given the bleeding, risks outweigh the benefits.   Continue ASA.   Candee Furbish, MD

## 2018-01-20 NOTE — Telephone Encounter (Signed)
Spoke with pt who is reporting she has noticed some vaginal bleeding that is now "getting worse."  She has been reading on the Internet that the most common side effect is excessive bleeding.  Advised coumadin is a blood thinner and while bleeding is a common side effect, generally we don't often see vaginal bleeding.  Most recent INR was 1.4 on 01/15/18  Her dose was adjusted and she is scheduled to have it rechecked on 2/14.  Pt is wanting to decrease her dose or discontinue it.  Advised against this and that I will forward this information to Dr [name] for review and orders.  Also recommended she see her PCP for further evaluation (she doesn't have OB/GYN).

## 2018-01-21 NOTE — Telephone Encounter (Signed)
Left another message for pt to c/b to discuss need for medication changes.

## 2018-01-22 ENCOUNTER — Ambulatory Visit (INDEPENDENT_AMBULATORY_CARE_PROVIDER_SITE_OTHER): Payer: Medicare Other | Admitting: *Deleted

## 2018-01-22 DIAGNOSIS — I080 Rheumatic disorders of both mitral and aortic valves: Secondary | ICD-10-CM

## 2018-01-22 DIAGNOSIS — Z952 Presence of prosthetic heart valve: Secondary | ICD-10-CM | POA: Diagnosis not present

## 2018-01-22 DIAGNOSIS — R011 Cardiac murmur, unspecified: Secondary | ICD-10-CM | POA: Diagnosis not present

## 2018-01-22 DIAGNOSIS — I050 Rheumatic mitral stenosis: Secondary | ICD-10-CM

## 2018-01-22 LAB — POCT INR: INR: 1.7

## 2018-01-22 NOTE — Patient Instructions (Signed)
Description   Stop taking Coumadin & follow-up with PCP for bleeding.

## 2018-01-28 NOTE — Telephone Encounter (Signed)
Hi Pam,  Ms. Lahey did come in today and we discussed the below. She was agreeable to stopping coumadin and making an appt for primary care for GYN eval or referral. Please let me know if you need anything else on her.   Thanks,  Georgina Peer

## 2018-02-02 ENCOUNTER — Ambulatory Visit (INDEPENDENT_AMBULATORY_CARE_PROVIDER_SITE_OTHER): Payer: 59 | Admitting: Podiatry

## 2018-02-02 ENCOUNTER — Encounter: Payer: Self-pay | Admitting: Podiatry

## 2018-02-02 DIAGNOSIS — Q828 Other specified congenital malformations of skin: Secondary | ICD-10-CM | POA: Diagnosis not present

## 2018-02-02 DIAGNOSIS — B351 Tinea unguium: Secondary | ICD-10-CM | POA: Diagnosis not present

## 2018-02-02 DIAGNOSIS — M79674 Pain in right toe(s): Secondary | ICD-10-CM | POA: Diagnosis not present

## 2018-02-02 DIAGNOSIS — D689 Coagulation defect, unspecified: Secondary | ICD-10-CM

## 2018-02-02 DIAGNOSIS — M779 Enthesopathy, unspecified: Secondary | ICD-10-CM | POA: Diagnosis not present

## 2018-02-02 DIAGNOSIS — M79675 Pain in left toe(s): Secondary | ICD-10-CM

## 2018-02-02 MED ORDER — TRIAMCINOLONE ACETONIDE 10 MG/ML IJ SUSP
10.0000 mg | Freq: Once | INTRAMUSCULAR | Status: AC
Start: 2018-02-02 — End: 2018-02-02
  Administered 2018-02-02: 10 mg

## 2018-02-03 NOTE — Progress Notes (Signed)
Subjective:   Patient ID: Denise Cuevas, female   DOB: 67 y.o.   MRN: [id number]   HPI Patient presents with significant inflammation fifth metatarsal base left with keratotic lesion underneath the fifth metatarsal heads of both feet and nail disease 1-5 both feet.  Patient does have long-term diabetes that is under reasonable control   ROS      Objective:  Physical Exam  Neurovascular status checked and found to be unchanged from previous visit with patient found to have lesions underneath the fifth metatarsals of both feet and inflammation fluid of the fifth metatarsal base left with pain with palpation.  Patient's nails are all thickened 1-5 both feet with yellow subungual debris and pain with palpation     Assessment:  Inflammatory capsulitis fifth MPJ base left with fluid buildup and porokeratotic type lesion fifth metatarsal bilateral along with nail disease 1-5 both feet     Plan:  All conditions reviewed and careful injection administered base of fifth metatarsal left 3 mg dexamethasone Kenalog 5 mg Xylocaine and debrided lesions on both feet and nailbeds 1-5 both feet with no iatrogenic bleeding noted

## 2018-02-05 ENCOUNTER — Other Ambulatory Visit: Payer: Self-pay | Admitting: Physician Assistant

## 2018-02-12 ENCOUNTER — Other Ambulatory Visit: Payer: Self-pay

## 2018-02-12 ENCOUNTER — Ambulatory Visit (INDEPENDENT_AMBULATORY_CARE_PROVIDER_SITE_OTHER): Payer: Medicare Other | Admitting: Cardiothoracic Surgery

## 2018-02-12 ENCOUNTER — Encounter: Payer: Self-pay | Admitting: Cardiothoracic Surgery

## 2018-02-12 VITALS — BP 120/70 | HR 88 | Resp 18 | Ht 62.0 in | Wt 162.8 lb

## 2018-02-12 DIAGNOSIS — Z952 Presence of prosthetic heart valve: Secondary | ICD-10-CM | POA: Diagnosis not present

## 2018-02-12 NOTE — Progress Notes (Signed)
[facility]       Denise [place] [address]             [phone]      Denise Cuevas [facility] Medical Record #[id number] Date of Birth: 06-Oct-1951  Referring: [name], MD Primary Care: [name], MD Primary Cardiologist: No primary care provider on file.   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 SURGICAL PROCEDURE:  Redo sternotomy; cardiopulmonary bypass with replacement of aortic valve with [employer] pericardial tissue valve, model 3300TFX, 19 mm, serial #[id number], and mitral valve replacement with [facility] pericardial tissue valve, model 7300TFX, 25 mm, serial #[id number]; placement of right femoral arterial line. SURGEON:  [name], MD.   History of Present Illness:     Patient returns to the office today after double valve replacement redo surgery done in early December.  The patient has known underlying cirrhosis Her mental status is improved since taking lactulose.  She is ambulatory around her house.  She notes she has fallen twice over the past couple weeks.  She notes her shortness of breath is improved.  Dr. [name] stopped her Coumadin last week.    Past Medical History:  Diagnosis Date  . Angina   . Asthma   . CHF (congestive heart failure) ([place])   . Cirrhosis of liver ([place]) 08/2014   idiopathic  . COPD (chronic obstructive pulmonary disease) ([place])   . Coronary artery disease    s/p CABG  . Depression   . Diabetes mellitus    type 2  . Dyspnea   . Edema extremities    consistent edema lower legs, feet, and right quadrant abdominal pain-"goes and comes"  . Family history of adverse reaction to anesthesia    SISTER HAS NAUSEA  .  Fibromyalgia   . GERD (gastroesophageal reflux disease)   . H/O hiatal hernia   . Headache(784.0)    occ. generalized  . Heart murmur   . Hypercholesteremia   . Hypertension   . Hypothyroid   . Myocardial infarction [name]) 11/2011   MI x2- 3'15,1'76 coronary stent(chest pain episode at time)  . PONV (postoperative nausea and vomiting)      Social History   Tobacco Use  Smoking Status Never Smoker  Smokeless Tobacco Never Used    Social History   Substance and Sexual Activity  Alcohol Use No     Allergies  Allergen Reactions  . Exenatide Nausea And Vomiting  . Ace Inhibitors Other (See Comments)    pseudoasthma    Current Outpatient Medications  Medication Sig Dispense Refill  . albuterol (PROVENTIL HFA;VENTOLIN HFA) 108 (90 BASE) MCG/ACT inhaler Inhale 2 puffs into the lungs every 6 (six) hours as needed for wheezing or shortness of breath. 1 Inhaler 2  . albuterol (PROVENTIL) (2.5 MG/3ML) 0.083% nebulizer solution Take 3 mLs (2.5 mg total) by nebulization every 2 (two) hours as needed for wheezing. 75 mL 0  . aspirin 81 MG tablet Take 81 mg by mouth daily.    . DULoxetine (CYMBALTA) 30 MG capsule Take 30 mg by mouth daily with lunch. In conjunction with one 60 mg capsule to equal a total of 90 milligrams    . DULoxetine (CYMBALTA) 60 MG capsule Take 60 mg by mouth daily with  lunch. In conjunction with one 30 mg capsule to equal a total of 90 milligrams    . furosemide (LASIX) 40 MG tablet Take 40 mg by mouth daily.    Marland Kitchen HUMALOG MIX 75/25 KWIKPEN (75-25) 100 UNIT/ML Kwikpen INJECT 60 UNITS SUBCUTANEOUSLY BEFORE BREAKFAST AND 60 UNITS BEFORE EVENING MEAL  10  . JARDIANCE 25 MG TABS tablet Take 25 mg daily by mouth.  5  . KRISTALOSE 20 g packet Take 20 g by mouth 3 (three) times daily.  3  . levothyroxine (SYNTHROID, LEVOTHROID) 50 MCG tablet Take 1 tablet (50 mcg total) by mouth daily before breakfast. 30 tablet 0  . mometasone-formoterol (DULERA) 100-5 MCG/ACT AERO  Inhale 2 puffs into the lungs 2 (two) times daily as needed for wheezing or shortness of breath.    Marland Kitchen omeprazole-sodium bicarbonate (ZEGERID) 40-1100 MG per capsule Take 1 capsule by mouth daily before breakfast.    . spironolactone (ALDACTONE) 25 MG tablet Take 1 tablet (25 mg total) by mouth daily. 30 tablet 0  . traMADol (ULTRAM) 50 MG tablet Take 1 tablet (50 mg total) by mouth every 6 (six) hours as needed for moderate pain. 30 tablet 0   No current facility-administered medications for this visit.        Physical Exam: BP 120/70 (BP Location: Left Arm, Patient Position: Sitting, Cuff Size: Large)   Pulse 88   Resp 18   Ht 5\' 2"  (1.575 m)   Wt 162 lb 12.8 oz (73.8 kg)   LMP  (LMP Unknown)   SpO2 99% Comment: RA  BMI 29.78 kg/m   General appearance: alert, cooperative and No liver flap Neurologic: intact Heart: regular rate and rhythm, S1, S2 normal, no murmur, click, rub or gallop Lungs: diminished breath sounds bibasilar Abdomen: soft, non-tender; bowel sounds normal; no masses,  no organomegaly Extremities: extremities normal, atraumatic, no cyanosis or edema and Homans sign is negative, no sign of DVT Wound: Sternum is stable and well-healed 1 cm area of slight bruising over the left sacral area  Diagnostic Studies & Laboratory data:     Recent Radiology Findings:   No results found.    Recent Lab Findings: Lab Results  Component Value Date   WBC 7.2 12/17/2017   HGB 11.9 (L) 12/17/2017   HCT 37.9 12/17/2017   PLT 123 (L) 12/17/2017   GLUCOSE 251 (H) 12/23/2017   CHOL 121 10/18/2017   TRIG 61 10/18/2017   HDL 33 (L) 10/18/2017   LDLCALC 76 10/18/2017   ALT 63 (H) 12/23/2017   AST 125 (H) 12/23/2017   NA 135 12/23/2017   K 4.3 12/23/2017   CL 96 12/23/2017   CREATININE 0.94 12/23/2017   BUN 12 12/23/2017   CO2 19 (L) 12/23/2017   TSH 7.323 (H) 12/17/2017   INR 1.7 01/22/2018   HGBA1C 7.5 (H) 11/10/2017      Assessment / Plan:      Patient  slowly progressing following double valve replacement with tissue aortic and tissue mitral valves December 5.  The patient is slowly improving, her main complaint today is soreness over her left sacral area where she fell several days ago. Will refer the patient to cardiac rehab to see if aggressive outpatient cardiac rehab physical therapy will benefit her mobility Plan to see her back in 6 weeks    [name] MD      [address] [place] [address] Office [phone]   Beeper [phone]  02/12/2018 3:39 PM

## 2018-02-12 NOTE — Patient Instructions (Signed)

## 2018-02-15 ENCOUNTER — Emergency Department (HOSPITAL_COMMUNITY): Payer: Medicare Other

## 2018-02-15 ENCOUNTER — Other Ambulatory Visit: Payer: Self-pay

## 2018-02-15 ENCOUNTER — Encounter (HOSPITAL_COMMUNITY): Payer: Self-pay | Admitting: *Deleted

## 2018-02-15 ENCOUNTER — Inpatient Hospital Stay (HOSPITAL_COMMUNITY)
Admission: EM | Admit: 2018-02-15 | Discharge: 2018-02-20 | DRG: 441 | Disposition: A | Discharge status: Home / Self Care | Payer: Medicare Other | Attending: Family Medicine | Admitting: Family Medicine

## 2018-02-15 DIAGNOSIS — Z7982 Long term (current) use of aspirin: Secondary | ICD-10-CM

## 2018-02-15 DIAGNOSIS — K7581 Nonalcoholic steatohepatitis (NASH): Secondary | ICD-10-CM | POA: Diagnosis present

## 2018-02-15 DIAGNOSIS — K219 Gastro-esophageal reflux disease without esophagitis: Secondary | ICD-10-CM | POA: Diagnosis present

## 2018-02-15 DIAGNOSIS — E039 Hypothyroidism, unspecified: Secondary | ICD-10-CM | POA: Diagnosis present

## 2018-02-15 DIAGNOSIS — Z951 Presence of aortocoronary bypass graft: Secondary | ICD-10-CM

## 2018-02-15 DIAGNOSIS — K5900 Constipation, unspecified: Secondary | ICD-10-CM | POA: Diagnosis present

## 2018-02-15 DIAGNOSIS — Z825 Family history of asthma and other chronic lower respiratory diseases: Secondary | ICD-10-CM

## 2018-02-15 DIAGNOSIS — Z953 Presence of xenogenic heart valve: Secondary | ICD-10-CM

## 2018-02-15 DIAGNOSIS — I5032 Chronic diastolic (congestive) heart failure: Secondary | ICD-10-CM | POA: Diagnosis present

## 2018-02-15 DIAGNOSIS — D6959 Other secondary thrombocytopenia: Secondary | ICD-10-CM | POA: Diagnosis present

## 2018-02-15 DIAGNOSIS — Z955 Presence of coronary angioplasty implant and graft: Secondary | ICD-10-CM

## 2018-02-15 DIAGNOSIS — K7460 Unspecified cirrhosis of liver: Secondary | ICD-10-CM | POA: Diagnosis present

## 2018-02-15 DIAGNOSIS — D62 Acute posthemorrhagic anemia: Secondary | ICD-10-CM | POA: Diagnosis present

## 2018-02-15 DIAGNOSIS — E119 Type 2 diabetes mellitus without complications: Secondary | ICD-10-CM | POA: Diagnosis present

## 2018-02-15 DIAGNOSIS — E7800 Pure hypercholesterolemia, unspecified: Secondary | ICD-10-CM | POA: Diagnosis present

## 2018-02-15 DIAGNOSIS — I252 Old myocardial infarction: Secondary | ICD-10-CM | POA: Diagnosis not present

## 2018-02-15 DIAGNOSIS — I110 Hypertensive heart disease with heart failure: Secondary | ICD-10-CM | POA: Diagnosis present

## 2018-02-15 DIAGNOSIS — Z8249 Family history of ischemic heart disease and other diseases of the circulatory system: Secondary | ICD-10-CM

## 2018-02-15 DIAGNOSIS — K7200 Acute and subacute hepatic failure without coma: Secondary | ICD-10-CM | POA: Diagnosis present

## 2018-02-15 DIAGNOSIS — I4891 Unspecified atrial fibrillation: Secondary | ICD-10-CM | POA: Diagnosis present

## 2018-02-15 DIAGNOSIS — N179 Acute kidney failure, unspecified: Secondary | ICD-10-CM | POA: Diagnosis present

## 2018-02-15 DIAGNOSIS — R0602 Shortness of breath: Secondary | ICD-10-CM

## 2018-02-15 DIAGNOSIS — I8501 Esophageal varices with bleeding: Secondary | ICD-10-CM | POA: Diagnosis present

## 2018-02-15 DIAGNOSIS — K922 Gastrointestinal hemorrhage, unspecified: Secondary | ICD-10-CM | POA: Diagnosis not present

## 2018-02-15 DIAGNOSIS — I2510 Atherosclerotic heart disease of native coronary artery without angina pectoris: Secondary | ICD-10-CM | POA: Diagnosis present

## 2018-02-15 DIAGNOSIS — M797 Fibromyalgia: Secondary | ICD-10-CM | POA: Diagnosis present

## 2018-02-15 DIAGNOSIS — R188 Other ascites: Secondary | ICD-10-CM | POA: Diagnosis present

## 2018-02-15 DIAGNOSIS — K652 Spontaneous bacterial peritonitis: Secondary | ICD-10-CM | POA: Diagnosis present

## 2018-02-15 DIAGNOSIS — J449 Chronic obstructive pulmonary disease, unspecified: Secondary | ICD-10-CM | POA: Diagnosis present

## 2018-02-15 DIAGNOSIS — I8511 Secondary esophageal varices with bleeding: Secondary | ICD-10-CM | POA: Diagnosis not present

## 2018-02-15 DIAGNOSIS — K7682 Hepatic encephalopathy: Secondary | ICD-10-CM | POA: Diagnosis present

## 2018-02-15 DIAGNOSIS — Z79890 Hormone replacement therapy (postmenopausal): Secondary | ICD-10-CM

## 2018-02-15 DIAGNOSIS — E785 Hyperlipidemia, unspecified: Secondary | ICD-10-CM | POA: Diagnosis present

## 2018-02-15 DIAGNOSIS — R109 Unspecified abdominal pain: Secondary | ICD-10-CM | POA: Diagnosis present

## 2018-02-15 DIAGNOSIS — Z794 Long term (current) use of insulin: Secondary | ICD-10-CM

## 2018-02-15 DIAGNOSIS — R011 Cardiac murmur, unspecified: Secondary | ICD-10-CM | POA: Diagnosis present

## 2018-02-15 DIAGNOSIS — F329 Major depressive disorder, single episode, unspecified: Secondary | ICD-10-CM | POA: Diagnosis present

## 2018-02-15 DIAGNOSIS — K766 Portal hypertension: Secondary | ICD-10-CM | POA: Diagnosis present

## 2018-02-15 DIAGNOSIS — K3189 Other diseases of stomach and duodenum: Secondary | ICD-10-CM | POA: Diagnosis present

## 2018-02-15 DIAGNOSIS — K2210 Ulcer of esophagus without bleeding: Secondary | ICD-10-CM | POA: Diagnosis present

## 2018-02-15 DIAGNOSIS — K7290 Hepatic failure, unspecified without coma: Secondary | ICD-10-CM | POA: Diagnosis not present

## 2018-02-15 DIAGNOSIS — Z888 Allergy status to other drugs, medicaments and biological substances status: Secondary | ICD-10-CM

## 2018-02-15 LAB — URINALYSIS, ROUTINE W REFLEX MICROSCOPIC
BILIRUBIN URINE: NEGATIVE
Bacteria, UA: NONE SEEN
Hgb urine dipstick: NEGATIVE
KETONES UR: 5 mg/dL — AB
Nitrite: NEGATIVE
PH: 5 (ref 5.0–8.0)
Protein, ur: NEGATIVE mg/dL
Specific Gravity, Urine: 1.039 — ABNORMAL HIGH (ref 1.005–1.030)

## 2018-02-15 LAB — COMPREHENSIVE METABOLIC PANEL
ALK PHOS: 190 U/L — AB (ref 38–126)
ALT: 47 U/L (ref 14–54)
AST: 120 U/L — ABNORMAL HIGH (ref 15–41)
Albumin: 1.9 g/dL — ABNORMAL LOW (ref 3.5–5.0)
Anion gap: 13 (ref 5–15)
BUN: 31 mg/dL — ABNORMAL HIGH (ref 6–20)
CALCIUM: 8 mg/dL — AB (ref 8.9–10.3)
CHLORIDE: 100 mmol/L — AB (ref 101–111)
CO2: 18 mmol/L — ABNORMAL LOW (ref 22–32)
CREATININE: 1.04 mg/dL — AB (ref 0.44–1.00)
GFR, EST NON AFRICAN AMERICAN: 56 mL/min — AB (ref >60)
Glucose, Bld: 297 mg/dL — ABNORMAL HIGH (ref 65–99)
Potassium: 5.8 mmol/L — ABNORMAL HIGH (ref 3.5–5.1)
Sodium: 131 mmol/L — ABNORMAL LOW (ref 135–145)
Total Bilirubin: 5.4 mg/dL — ABNORMAL HIGH (ref 0.3–1.2)
Total Protein: 5.3 g/dL — ABNORMAL LOW (ref 6.5–8.1)

## 2018-02-15 LAB — LIPASE, BLOOD: LIPASE: 41 U/L (ref 11–51)

## 2018-02-15 LAB — SAMPLE TO BLOOD BANK

## 2018-02-15 LAB — I-STAT TROPONIN, ED: Troponin i, poc: 0.01 ng/mL (ref 0.00–0.08)

## 2018-02-15 LAB — AMMONIA: AMMONIA: 112 umol/L — AB (ref 9–35)

## 2018-02-15 LAB — CBC
HCT: 29.4 % — ABNORMAL LOW (ref 36.0–46.0)
Hemoglobin: 9.5 g/dL — ABNORMAL LOW (ref 12.0–15.0)
MCH: 29.1 pg (ref 26.0–34.0)
MCHC: 32.3 g/dL (ref 30.0–36.0)
MCV: 89.9 fL (ref 78.0–100.0)
PLATELETS: 156 10*3/uL (ref 150–400)
RBC: 3.27 MIL/uL — AB (ref 3.87–5.11)
RDW: 19 % — ABNORMAL HIGH (ref 11.5–15.5)
WBC: 13.5 10*3/uL — AB (ref 4.0–10.5)

## 2018-02-15 LAB — GLUCOSE, CAPILLARY: GLUCOSE-CAPILLARY: 295 mg/dL — AB (ref 65–99)

## 2018-02-15 LAB — PROTIME-INR
INR: 1.68
Prothrombin Time: 19.6 seconds — ABNORMAL HIGH (ref 11.4–15.2)

## 2018-02-15 MED ORDER — DULOXETINE HCL 60 MG PO CPEP: 60.0000 mg | ORAL_CAPSULE | Freq: Every day | ORAL | Status: DC

## 2018-02-15 MED ORDER — ALBUMIN HUMAN 25 % IV SOLN
100.0000 g | Freq: Once | INTRAVENOUS | Status: AC
  Administered 2018-02-16: 100 g via INTRAVENOUS
  Filled 2018-02-15: qty 400

## 2018-02-15 MED ORDER — LACTULOSE 10 GM/15ML PO SOLN
20.0000 g | Freq: Once | ORAL | Status: AC
  Administered 2018-02-15: 20 g via ORAL
  Filled 2018-02-15: qty 30

## 2018-02-15 MED ORDER — ALBUTEROL SULFATE (2.5 MG/3ML) 0.083% IN NEBU
3.0000 mL | INHALATION_SOLUTION | Freq: Four times a day (QID) | RESPIRATORY_TRACT | Status: DC | PRN
  Filled 2018-02-15: qty 3

## 2018-02-15 MED ORDER — ONDANSETRON HCL 4 MG/2ML IJ SOLN
4.0000 mg | Freq: Once | INTRAMUSCULAR | Status: AC
  Administered 2018-02-15: 4 mg via INTRAVENOUS
  Filled 2018-02-15: qty 2

## 2018-02-15 MED ORDER — RIFAXIMIN 550 MG PO TABS
550.0000 mg | ORAL_TABLET | Freq: Two times a day (BID) | ORAL | Status: DC
  Administered 2018-02-16 – 2018-02-20 (×10): 550 mg via ORAL
  Filled 2018-02-15 (×11): qty 1

## 2018-02-15 MED ORDER — LACTULOSE 10 GM/15ML PO SOLN
20.0000 g | ORAL | Status: AC
  Administered 2018-02-16 (×3): 20 g via ORAL
  Filled 2018-02-15 (×2): qty 30

## 2018-02-15 MED ORDER — SODIUM CHLORIDE 0.9% FLUSH
3.0000 mL | Freq: Two times a day (BID) | INTRAVENOUS | Status: DC
  Administered 2018-02-16 – 2018-02-20 (×9): 3 mL via INTRAVENOUS

## 2018-02-15 MED ORDER — LEVOTHYROXINE SODIUM 50 MCG PO TABS
50.0000 ug | ORAL_TABLET | Freq: Every day | ORAL | Status: DC
  Administered 2018-02-16 – 2018-02-20 (×5): 50 ug via ORAL
  Filled 2018-02-15 (×6): qty 1

## 2018-02-15 MED ORDER — CEFTRIAXONE SODIUM 2 G IJ SOLR
2.0000 g | INTRAMUSCULAR | Status: DC
Start: 2018-02-16 — End: 2018-02-20
  Administered 2018-02-16 – 2018-02-20 (×5): 2 g via INTRAVENOUS
  Filled 2018-02-15 (×6): qty 20

## 2018-02-15 MED ORDER — DULOXETINE HCL 30 MG PO CPEP
90.0000 mg | ORAL_CAPSULE | Freq: Every day | ORAL | Status: DC
  Administered 2018-02-16 – 2018-02-19 (×4): 90 mg via ORAL
  Filled 2018-02-15 (×4): qty 1

## 2018-02-15 MED ORDER — LACTULOSE ENEMA: 300.0000 mL | ORAL | Status: DC | PRN

## 2018-02-15 MED ORDER — ASPIRIN 81 MG PO CHEW
81.0000 mg | CHEWABLE_TABLET | Freq: Every day | ORAL | Status: DC
  Administered 2018-02-16: 81 mg via ORAL
  Filled 2018-02-15: qty 1

## 2018-02-15 MED ORDER — SODIUM CHLORIDE 0.9 % IV BOLUS (SEPSIS)
1000.0000 mL | Freq: Once | INTRAVENOUS | Status: AC
  Administered 2018-02-15: 1000 mL via INTRAVENOUS

## 2018-02-15 MED ORDER — IOPAMIDOL (ISOVUE-300) INJECTION 61%
INTRAVENOUS | Status: AC
  Filled 2018-02-15: qty 100

## 2018-02-15 MED ORDER — DULOXETINE HCL 30 MG PO CPEP: 30.0000 mg | ORAL_CAPSULE | Freq: Every day | ORAL | Status: DC

## 2018-02-15 NOTE — ED Notes (Signed)
Pt is diaphoretic at this time

## 2018-02-15 NOTE — ED Notes (Signed)
Attempt to call report to 6N. Secretary said the nurses phone is not working right and will find nurse and have her call me back to receive report.

## 2018-02-15 NOTE — Progress Notes (Signed)
20G IV that was placed in Left forearm did not work. Saline flush x 3 caused pain, tightness and swelling at IV site. New 20G IV placed in Right AC successfully and CT was completed.

## 2018-02-15 NOTE — ED Provider Notes (Signed)
[place] EMERGENCY DEPARTMENT Provider Note   CSN: [id number] Arrival date & time: 02/15/18  1458     History   Chief Complaint Chief Complaint  Patient presents with  . Abdominal Pain    HPI Denise Cuevas is a 67 y.o. female.  HPI  Denise Cuevas is a 67yo female with a history of idiopathic cirrhosis, diastolic CHF (40-08%), CAD, type 2 diabetes, atrial fibrillation who presents to the emergency department for evaluation of nausea/vomiting, jaundice and abdominal pain.  Patient is a poor historian, asks husband to provide HPI. She answers mostly yes or no to questioning. Per husband at bedside, patient developed generalized abdominal 2 days ago. She reports that her pain is 8/10 in severity at this time. She has not been able to keep any food or liquid down, vomiting immediately after eating.  She has felt generally weak, he could barely walk her to the bathroom at home today.  States that she appears more jaundiced than usual, per husband.  She has also seemed somewhat confused, talking about things that are not relevant. Has had some shortness of breath, but she cannot say when this started. Denies fever, chills, sore throat, congestion, headache, chest pain, wheezing, leg swelling, diarrhea, dysuria, urinary frequency. She has not been able to keep down any of her medication for the past two days including prescribed Kristalose. Denies history of DVT/PE, calf tenderness, exogenous estrogen, recent surgery, cancer.   Past Medical History:  Diagnosis Date  . Angina   . Asthma   . CHF (congestive heart failure) ([place])   . Cirrhosis of liver ([place]) 08/2014   idiopathic  . COPD (chronic obstructive pulmonary disease) ([place])   . Coronary artery disease    s/p CABG  . Depression   . Diabetes mellitus    type 2  . Dyspnea   . Edema extremities    consistent edema lower legs, feet, and right quadrant abdominal pain-"goes and comes"  . Family history of adverse  reaction to anesthesia    SISTER HAS NAUSEA  . Fibromyalgia   . GERD (gastroesophageal reflux disease)   . H/O hiatal hernia   . Headache(784.0)    occ. generalized  . Heart murmur   . Hypercholesteremia   . Hypertension   . Hypothyroid   . Myocardial infarction [facility]) 11/2011   MI x2- 6'76,1'95 coronary stent(chest pain episode at time)  . PONV (postoperative nausea and vomiting)     Patient Active Problem List   Diagnosis Date Noted  . Hepatic encephalopathy ([place]) 12/17/2017  . Atrial fibrillation ([place]) [I48.91] 11/28/2017  . Hospital discharge follow-up 10/30/2017  . Nonrheumatic aortic valve stenosis   . Chronic bronchitis ([place])   . Other chest pain 10/17/2017  . Peripheral edema   . Unstable angina ([place]) 10/15/2017  . Asthma, cough variant   . Cirrhosis of liver not due to alcohol ([place]) 11/20/2016  . Pancytopenia ([place]) 11/20/2016  . Protein-calorie malnutrition, severe ([place]) 11/20/2016  . Acute diastolic CHF (congestive heart failure) ([place])   . Congestive heart failure ([place]) 11/19/2016  . Chest pain 05/29/2015  . Memory loss 05/29/2015  . Essential hypertension 03/20/2015  . Dyspnea 02/02/2015  . Acute respiratory distress 02/02/2015  . Depression   . GERD (gastroesophageal reflux disease)   . Unstable angina pectoris ([place]) 11/25/2011  . Hypothyroidism   . CAD (coronary artery disease)   . Other and unspecified angina pectoris 11/19/2011  . Type 2 diabetes mellitus, uncontrolled ([place]) 11/19/2011  .  Myocardial infarction ([place]) 11/09/2011    Past Surgical History:  Procedure Laterality Date  . AORTIC VALVE REPLACEMENT N/A 11/12/2017   Procedure: AORTIC VALVE REPLACEMENT (AVR)  USING 19 MM MAGNA EASE PERICARDIAL BIOPROSTHESIS-AORTIC, MODEL 3300TFX;  Surgeon: [name], MD;  Location: [place];  Service: Open Heart Surgery;  Laterality: N/A;  . ARTERIAL LINE INSERTION Right 11/12/2017   Procedure: ARTERIAL LINE INSERTION;  Surgeon: [name], MD;   Location: [place];  Service: Open Heart Surgery;  Laterality: Right;  placed in right femoral artery.  . CHOLECYSTECTOMY     open many yrs ago  . COLONOSCOPY WITH PROPOFOL N/A 08/24/2014   Procedure: COLONOSCOPY WITH PROPOFOL;  Surgeon: Arta Silence, MD;  Location: [facility] ENDOSCOPY;  Service: Endoscopy;  Laterality: N/A;  . CORONARY ANGIOPLASTY WITH STENT PLACEMENT  05/06/2012  . CORONARY ARTERY BYPASS GRAFT  11/27/2011   Procedure: CORONARY ARTERY BYPASS GRAFTING (CABG);  Surgeon: [name], MD;  Location: [place];  Service: Open Heart Surgery;  Laterality: N/A;  coronary artery bypass graft on pump times two utilizing left internal mammary artery and right saphenous vein harvested endoscopically   . ESOPHAGOGASTRODUODENOSCOPY (EGD) WITH PROPOFOL N/A 08/24/2014   Procedure: ESOPHAGOGASTRODUODENOSCOPY (EGD) WITH PROPOFOL;  Surgeon: Arta Silence, MD;  Location: [facility];  Service: Endoscopy;  Laterality: N/A;  . KNEE SURGERY Right    scope surgery  . LEFT HEART CATHETERIZATION WITH CORONARY ANGIOGRAM N/A 11/19/2011   Procedure: LEFT HEART CATHETERIZATION WITH CORONARY ANGIOGRAM;  Surgeon: Candee Furbish, MD;  Location: [facility] CATH LAB;  Service: Cardiovascular;  Laterality: N/A;  . LEFT HEART CATHETERIZATION WITH CORONARY ANGIOGRAM  05/06/2012   Procedure: LEFT HEART CATHETERIZATION WITH CORONARY ANGIOGRAM;  Surgeon: Jettie Booze, MD;  Location: [facility] CATH LAB;  Service: Cardiovascular;;  . LEFT HEART CATHETERIZATION WITH CORONARY/GRAFT ANGIOGRAM N/A 03/24/2012   Procedure: LEFT HEART CATHETERIZATION WITH Beatrix Fetters;  Surgeon: Jettie Booze, MD;  Location: [facility] C[facility] LAB;  Service: Cardiovascular;  Laterality: N/A;  . MITRAL VALVE REPLACEMENT N/A 11/12/2017   Procedure: MITRAL VALVE (MV) REPLACEMENT USING 25 MM MAGNA MITRAL EASE PERICARDIAL BIOPROSTHESIS, MODEL 7300TFX;  Surgeon: [name], MD;  Location: [facility];  Service: Open Heart Surgery;  Laterality: N/A;  Using 65mm  Magna Ease Mitral Pericardial Bioprosthesis  . PERCUTANEOUS CORONARY STENT INTERVENTION (PCI-S)  03/24/2012   Procedure: PERCUTANEOUS CORONARY STENT INTERVENTION (PCI-S);  Surgeon: Jettie Booze, MD;  Location: [facility] CATH LAB;  Service: Cardiovascular;;  . PERCUTANEOUS CORONARY STENT INTERVENTION (PCI-S) Bilateral 05/06/2012   Procedure: PERCUTANEOUS CORONARY STENT INTERVENTION (PCI-S);  Surgeon: Jettie Booze, MD;  Location: [facility] CATH LAB;  Service: Cardiovascular;  Laterality: Bilateral;  . RIGHT/LEFT HEART CATH AND CORONARY/GRAFT ANGIOGRAPHY N/A 10/20/2017   Procedure: RIGHT/LEFT HEART CATH AND CORONARY/GRAFT ANGIOGRAPHY;  Surgeon: Burnell Blanks, MD;  Location: [place] CV LAB;  Service: Cardiovascular;  Laterality: N/A;  . TEE WITHOUT CARDIOVERSION N/A 10/20/2017   Procedure: TRANSESOPHAGEAL ECHOCARDIOGRAM (TEE);  Surgeon: Dorothy Spark, MD;  Location: [facility] ENDOSCOPY;  Service: Cardiovascular;  Laterality: N/A;  . TEE WITHOUT CARDIOVERSION N/A 11/12/2017   Procedure: TRANSESOPHAGEAL ECHOCARDIOGRAM (TEE);  Surgeon: [name], MD;  Location: [place];  Service: Open Heart Surgery;  Laterality: N/A;    OB History    No data available       Home Medications    Prior to Admission medications   Medication Sig Start Date End Date Taking? Authorizing Provider  albuterol (PROVENTIL HFA;VENTOLIN HFA) 108 (90 BASE) MCG/ACT  inhaler Inhale 2 puffs into the lungs every 6 (six) hours as needed for wheezing or shortness of breath. 02/04/15   Ghimire, Henreitta Leber, MD  albuterol (PROVENTIL) (2.5 MG/3ML) 0.083% nebulizer solution Take 3 mLs (2.5 mg total) by nebulization every 2 (two) hours as needed for wheezing. 02/04/15   Jonetta Osgood, MD  aspirin 81 MG tablet Take 81 mg by mouth daily.    [provider]  DULoxetine (CYMBALTA) 30 MG capsule Take 30 mg by mouth daily with lunch. In conjunction with one 60 mg capsule to equal a total of 90 milligrams    [provider]  DULoxetine (CYMBALTA) 60 MG capsule Take 60 mg by mouth daily with lunch. In conjunction with one 30 mg capsule to equal a total of 90 milligrams    [provider]  furosemide (LASIX) 40 MG tablet Take 40 mg by mouth daily.    [provider]  HUMALOG MIX 75/25 KWIKPEN (75-25) 100 UNIT/ML Kwikpen INJECT 60 UNITS SUBCUTANEOUSLY BEFORE BREAKFAST AND 60 UNITS BEFORE EVENING MEAL 09/11/17   [provider]  JARDIANCE 25 MG TABS tablet Take 25 mg daily by mouth. 10/12/17   [provider]  KRISTALOSE 20 g packet Take 20 g by mouth 3 (three) times daily. 01/12/18   [provider]  levothyroxine (SYNTHROID, LEVOTHROID) 50 MCG tablet Take 1 tablet (50 mcg total) by mouth daily before breakfast. 11/28/17   Jerline Pain, MD  mometasone-formoterol (DULERA) 100-5 MCG/ACT AERO Inhale 2 puffs into the lungs 2 (two) times daily as needed for wheezing or shortness of breath.    [provider]  omeprazole-sodium bicarbonate (ZEGERID) 40-1100 MG per capsule Take 1 capsule by mouth daily before breakfast.    [provider]  spironolactone (ALDACTONE) 25 MG tablet Take 1 tablet (25 mg total) by mouth daily. 11/23/16   Hongalgi, Lenis Dickinson, MD  traMADol (ULTRAM) 50 MG tablet Take 1 tablet (50 mg total) by mouth every 6 (six) hours as needed for moderate pain. 11/24/17   Barrett, Lodema Hong, PA-C    Family History Family History  Problem Relation Age of Onset  . Heart disease Father 57       died of MI  . Emphysema Father        smoked  . Peripheral vascular disease Mother 36       bilaterial amputations    Social History Social History   Tobacco Use  . Smoking status: Never Smoker  . Smokeless tobacco: Never Used  Substance Use Topics  . Alcohol use: No  . Drug use: No     Allergies   Exenatide and Ace inhibitors   Review of Systems Review of Systems  Constitutional: Negative for chills and fever.  HENT: Negative for  congestion and sore throat.   Respiratory: Positive for shortness of breath. Negative for cough and wheezing.   Cardiovascular: Negative for chest pain and leg swelling.  Gastrointestinal: Positive for abdominal distention, abdominal pain, nausea and vomiting. Negative for diarrhea.  Genitourinary: Negative for dysuria and frequency.  Musculoskeletal: Negative for gait problem.  Skin: Negative for rash.  Neurological: Positive for weakness (generalized). Negative for numbness and headaches.  Psychiatric/Behavioral: Negative for agitation.     Physical Exam Updated Vital Signs BP 129/76 (BP Location: Right Arm)   Pulse (!) 115   Temp 98.1 F (36.7 C) (Oral)   Resp 16   Ht 5\' 2"  (1.575 m)   Wt 72.1 kg (159 lb)  LMP  (LMP Unknown)   SpO2 100%   BMI 29.08 kg/m   Physical Exam  Constitutional: She is oriented to person, place, and time. She appears well-developed and well-nourished.  Diaphoretic. Appears visibly jaundiced and tired.   HENT:  Head: Normocephalic and atraumatic.  Mouth/Throat: No oropharyngeal exudate.  Mucous membranes dry.   Eyes: Pupils are equal, round, and reactive to light. Right eye exhibits no discharge. Left eye exhibits no discharge.  Scleral icterus bilaterally.   Neck: Normal range of motion. Neck supple. No JVD present.  Cardiovascular: Normal rate, regular rhythm and intact distal pulses.  Murmur (systolic) heard. Pulmonary/Chest: No respiratory distress.  Patient without respiratory distress, speaking in full sentences. Breaths somewhat shallow. No wheezes, rales or rhonchi.   Abdominal: Soft.  Abdomen soft and non-distended. Grossly tender to palpation. Seems somewhat more tender in the LLQ. No guarding or rigidity. No rebound tenderness.   Musculoskeletal:  Bilateral legs without swelling or calf tenderness. DP pulses 2+ bilaterally.   Neurological: She is alert and oriented to person, place, and time. Coordination normal.  Skin: Skin is warm.  She is diaphoretic.  Psychiatric: She has a normal mood and affect. Her behavior is normal.  Nursing note and vitals reviewed.    ED Treatments / Results  Labs (all labs ordered are listed, but only abnormal results are displayed) Labs Reviewed  COMPREHENSIVE METABOLIC PANEL - Abnormal; Notable for the following components:      Result Value   Sodium 131 (*)    Potassium 5.8 (*)    Chloride 100 (*)    CO2 18 (*)    Glucose, Bld 297 (*)    BUN 31 (*)    Creatinine, Ser 1.04 (*)    Calcium 8.0 (*)    Total Protein 5.3 (*)    Albumin 1.9 (*)    AST 120 (*)    Alkaline Phosphatase 190 (*)    Total Bilirubin 5.4 (*)    GFR calc non Af Amer 56 (*)    All other components within normal limits  CBC - Abnormal; Notable for the following components:   WBC 13.5 (*)    RBC 3.27 (*)    Hemoglobin 9.5 (*)    HCT 29.4 (*)    RDW 19.0 (*)    All other components within normal limits  URINALYSIS, ROUTINE W REFLEX MICROSCOPIC - Abnormal; Notable for the following components:   Color, Urine AMBER (*)    APPearance HAZY (*)    Specific Gravity, Urine 1.039 (*)    Glucose, UA >=500 (*)    Ketones, ur 5 (*)    Leukocytes, UA TRACE (*)    Squamous Epithelial / LPF 0-5 (*)    All other components within normal limits  AMMONIA - Abnormal; Notable for the following components:   Ammonia 112 (*)    All other components within normal limits  LIPASE, BLOOD  I-STAT TROPONIN, ED  SAMPLE TO BLOOD BANK    EKG  EKG Interpretation None       Radiology Dg Chest 2 View  Result Date: 02/15/2018 CLINICAL DATA:  Shortness of breath. EXAM: CHEST - 2 VIEW COMPARISON:  Chest x-ray dated January 01, 2018. FINDINGS: The heart size and mediastinal contours are within normal limits. Prior mitral and aortic valve replacements. Normal pulmonary vascularity. Low lung volumes. No focal consolidation, pleural effusion, or pneumothorax. No acute osseous abnormality. Unchanged elevation of the right  hemidiaphragm. IMPRESSION: No active cardiopulmonary disease. Electronically Signed  By: Titus Dubin M.D.   On: 02/15/2018 17:05   Ct Abdomen Pelvis W Contrast  Result Date: 02/15/2018 CLINICAL DATA:  The pt is c/o abd pain with nausea and vomiting since Thursday Yellow tent to her skin Cirrhosis of the liver non-alcoholic . No BM in 3 days. Patient has multiple pain complaints due to falling twice down steps x 2 weeks. Primarily right middle back EXAM: CT ABDOMEN AND PELVIS WITH CONTRAST TECHNIQUE: Multidetector CT imaging of the abdomen and pelvis was performed using the standard protocol following bolus administration of intravenous contrast. CONTRAST:  100 mL of Isovue-300 intravenous contrast COMPARISON:  None. FINDINGS: Lower chest: Small right and moderate left pleural effusions. There is dependent opacity in the left lower lobe consistent with atelectasis. Mild atelectasis is seen at the base of the right middle lobe. No evidence of pneumonia or pulmonary edema. Heart is normal in size. There changes from cardiac surgery and aortic valve replacement. Hepatobiliary: Morphologic changes of the liver consistent with cirrhosis. No liver mass or focal lesion. Gallbladder surgically absent. No bile duct dilation. Pancreas: Unremarkable. No pancreatic ductal dilatation or surrounding inflammatory changes. Spleen: Spleen top-normal in size measuring 12 x 5 x 12 cm. No splenic mass or focal lesion. Adrenals/Urinary Tract: Adrenal glands are unremarkable. Kidneys are normal, without renal calculi, focal lesion, or hydronephrosis. Bladder is unremarkable. Stomach/Bowel: Small hiatal hernia. Evidence of periesophageal varices. Stomach otherwise unremarkable. Small bowel is normal in caliber. No wall thickening. And no dilation of the colon. No colonic wall thickening. Appendix not visualized. Vascular/Lymphatic: Aortic atherosclerosis.  No aneurysm. There are multiple splenic collaterals. The portal vein is  small, but patent. Normal caliber patent splenic vein and superior mesenteric vein. No adenopathy. Reproductive: Uterus and bilateral adnexa are unremarkable. Other: Moderate amount of ascites.  No abdominal wall hernia. Musculoskeletal: No fracture or acute finding. No osteoblastic or osteolytic lesions. IMPRESSION: 1. No acute findings within the abdomen or pelvis. 2. Cirrhosis with portal venous hypertension. No liver masses. Portal venous hypertension is reflected by moderate ascites and upper abdominal venous collaterals. The portal vein is small but patent. 3. No bowel obstruction or inflammation. 4. Aortic atherosclerosis. 5. Moderate left and small right pleural effusions associated with dependent atelectasis. 6. No fracture or acute skeletal abnormality. Electronically Signed   By: Lajean Manes M.D.   On: 02/15/2018 19:22    Procedures Procedures (including critical care time)  Medications Ordered in ED Medications  iopamidol (ISOVUE-300) 61 % injection (not administered)  lactulose (CHRONULAC) 10 GM/15ML solution 20 g (not administered)  sodium chloride 0.9 % bolus 1,000 mL (0 mLs Intravenous Stopped 02/15/18 1750)  ondansetron (ZOFRAN) injection 4 mg (4 mg Intravenous Given 02/15/18 1644)     Initial Impression / Assessment and Plan / ED Course  I have reviewed the triage vital signs and the nursing notes.  Pertinent labs & imaging results that were available during my care of the patient were reviewed by me and considered in my medical decision making (see chart for details).    Patient will be admitted by Dr. Stana Bunting for decompensated hepatic encephalopathy. In brief she is a patient with idiopathic cirrhosis who presented with 2 days of nausea, vomiting and abdominal pain.  Has not been able to keep down any of her medications.  She appears more jaundiced and confused according to family members.  Labs reviewed.  Her ammonia is elevated at 112 (60 one month ago.)  She has a mild  leukocytosis (WBC 13.5). TBili  elevated at 5.4. Her potassium is elevated (K+ 5.8) and Creatinine appears elevated from baseline (1.04), likely related to her dehydration in the setting of recent vomiting. Patient given IV fluid bolus and Zofran in the department for vomiting. Do not suspect ACS given presentation, troponin negative, EKG non-ischemic (shows sinus tachycardia), CXR without acute abnormality.   CT abdomen shows cirrhosis with moderate ascites, no acute abnormality.  Discussed this patient with Dr. Alvino Chapel who also saw the patient and agrees with admission.  Spoke to hospitalist Dr. Myrlene Broker who will admit the patient for decompensated hepatic encephalopathy.  He requests diagnostic paracentesis to evaluate for SBP.  This was performed by Dr. Alvino Chapel.  On recheck patient reports that she has not had any nausea or vomiting since being in the department, feels better with fluids.  Awaiting bed for inpatient admission.  Final Clinical Impressions(s) / ED Diagnoses   Final diagnoses:  None    ED Discharge Orders    None       Bernarda Caffey 02/16/18 1131    Davonna Belling, MD 02/18/18 [phone]

## 2018-02-15 NOTE — ED Notes (Signed)
Pt. States she had chest pain during her CT scan, but is currently chest pain free

## 2018-02-15 NOTE — ED Triage Notes (Signed)
The pt is c/o abd pain with nausea and vomiting since Thursday  Yellow tent to her skin  Cirrhosis of the liver non-alcoholic  Rapid respirations  hyperventilating

## 2018-02-15 NOTE — ED Notes (Signed)
Patient transported to CT 

## 2018-02-15 NOTE — ED Notes (Signed)
Pt. Returned from CT.

## 2018-02-16 ENCOUNTER — Encounter (HOSPITAL_COMMUNITY)
Admission: EM | Disposition: A | Discharge status: Home / Self Care | Payer: Self-pay | Source: Home / Self Care | Attending: Family Medicine

## 2018-02-16 ENCOUNTER — Inpatient Hospital Stay (HOSPITAL_COMMUNITY): Payer: Medicare Other | Admitting: Anesthesiology

## 2018-02-16 ENCOUNTER — Encounter (HOSPITAL_COMMUNITY): Payer: Self-pay | Admitting: Anesthesiology

## 2018-02-16 DIAGNOSIS — D62 Acute posthemorrhagic anemia: Secondary | ICD-10-CM

## 2018-02-16 DIAGNOSIS — N179 Acute kidney failure, unspecified: Secondary | ICD-10-CM

## 2018-02-16 DIAGNOSIS — K652 Spontaneous bacterial peritonitis: Secondary | ICD-10-CM

## 2018-02-16 DIAGNOSIS — I8511 Secondary esophageal varices with bleeding: Secondary | ICD-10-CM

## 2018-02-16 HISTORY — PX: ESOPHAGOGASTRODUODENOSCOPY (EGD) WITH PROPOFOL: SHX5813

## 2018-02-16 LAB — CBC
HCT: 15.8 % — ABNORMAL LOW (ref 36.0–46.0)
HEMATOCRIT: 24.1 % — AB (ref 36.0–46.0)
HEMOGLOBIN: 7.6 g/dL — AB (ref 12.0–15.0)
HEMOGLOBIN: 5 g/dL — AB (ref 12.0–15.0)
MCH: 29.1 pg (ref 26.0–34.0)
MCH: 29.2 pg (ref 26.0–34.0)
MCHC: 31.5 g/dL (ref 30.0–36.0)
MCHC: 31.6 g/dL (ref 30.0–36.0)
MCV: 92.3 fL (ref 78.0–100.0)
MCV: 92.4 fL (ref 78.0–100.0)
Platelets: 143 10*3/uL — ABNORMAL LOW (ref 150–400)
Platelets: 104 10*3/uL — ABNORMAL LOW (ref 150–400)
RBC: 2.61 MIL/uL — AB (ref 3.87–5.11)
RBC: 1.71 MIL/uL — AB (ref 3.87–5.11)
RDW: 19.3 % — ABNORMAL HIGH (ref 11.5–15.5)
RDW: 19.8 % — ABNORMAL HIGH (ref 11.5–15.5)
WBC: 16.4 10*3/uL — AB (ref 4.0–10.5)
WBC: 17 10*3/uL — AB (ref 4.0–10.5)

## 2018-02-16 LAB — BODY FLUID CELL COUNT WITH DIFFERENTIAL
Lymphs, Fluid: 45 %
MONOCYTE-MACROPHAGE-SEROUS FLUID: 38 % — AB (ref 50–90)
NEUTROPHIL FLUID: 17 % (ref 0–25)
WBC FLUID: 264 uL (ref 0–1000)

## 2018-02-16 LAB — GLUCOSE, PLEURAL OR PERITONEAL FLUID: Glucose, Fluid: 304 mg/dL

## 2018-02-16 LAB — GLUCOSE, CAPILLARY
GLUCOSE-CAPILLARY: 291 mg/dL — AB (ref 65–99)
GLUCOSE-CAPILLARY: 202 mg/dL — AB (ref 65–99)
Glucose-Capillary: 241 mg/dL — ABNORMAL HIGH (ref 65–99)
Glucose-Capillary: 262 mg/dL — ABNORMAL HIGH (ref 65–99)

## 2018-02-16 LAB — MRSA PCR SCREENING: MRSA by PCR: NEGATIVE

## 2018-02-16 LAB — COMPREHENSIVE METABOLIC PANEL
ALBUMIN: 3.4 g/dL — AB (ref 3.5–5.0)
ALT: 42 U/L (ref 14–54)
ALT: 34 U/L (ref 14–54)
ANION GAP: 16 — AB (ref 5–15)
AST: 105 U/L — ABNORMAL HIGH (ref 15–41)
AST: 88 U/L — ABNORMAL HIGH (ref 15–41)
Albumin: 2 g/dL — ABNORMAL LOW (ref 3.5–5.0)
Alkaline Phosphatase: 169 U/L — ABNORMAL HIGH (ref 38–126)
Alkaline Phosphatase: 127 U/L — ABNORMAL HIGH (ref 38–126)
Anion gap: 15 (ref 5–15)
BILIRUBIN TOTAL: 5.2 mg/dL — AB (ref 0.3–1.2)
BUN: 36 mg/dL — ABNORMAL HIGH (ref 6–20)
BUN: 44 mg/dL — ABNORMAL HIGH (ref 6–20)
CHLORIDE: 101 mmol/L (ref 101–111)
CO2: 17 mmol/L — AB (ref 22–32)
CO2: 16 mmol/L — ABNORMAL LOW (ref 22–32)
Calcium: 8.1 mg/dL — ABNORMAL LOW (ref 8.9–10.3)
Calcium: 8.7 mg/dL — ABNORMAL LOW (ref 8.9–10.3)
Chloride: 98 mmol/L — ABNORMAL LOW (ref 101–111)
Creatinine, Ser: 1.33 mg/dL — ABNORMAL HIGH (ref 0.44–1.00)
Creatinine, Ser: 1.29 mg/dL — ABNORMAL HIGH (ref 0.44–1.00)
GFR calc Af Amer: 50 mL/min — ABNORMAL LOW (ref >60)
GFR calc non Af Amer: 43 mL/min — ABNORMAL LOW (ref >60)
GFR, EST AFRICAN AMERICAN: 48 mL/min — AB (ref >60)
GFR, EST NON AFRICAN AMERICAN: 41 mL/min — AB (ref >60)
Glucose, Bld: 372 mg/dL — ABNORMAL HIGH (ref 65–99)
Glucose, Bld: 219 mg/dL — ABNORMAL HIGH (ref 65–99)
POTASSIUM: 5.5 mmol/L — AB (ref 3.5–5.1)
POTASSIUM: 4.4 mmol/L (ref 3.5–5.1)
SODIUM: 133 mmol/L — AB (ref 135–145)
SODIUM: 130 mmol/L — AB (ref 135–145)
TOTAL PROTEIN: 5.5 g/dL — AB (ref 6.5–8.1)
Total Bilirubin: 6.1 mg/dL — ABNORMAL HIGH (ref 0.3–1.2)
Total Protein: 5.2 g/dL — ABNORMAL LOW (ref 6.5–8.1)

## 2018-02-16 LAB — HEMOGLOBIN AND HEMATOCRIT, BLOOD
HEMATOCRIT: 22.9 % — AB (ref 36.0–46.0)
Hemoglobin: 7.7 g/dL — ABNORMAL LOW (ref 12.0–15.0)

## 2018-02-16 LAB — PROTIME-INR
INR: 1.83
Prothrombin Time: 21 seconds — ABNORMAL HIGH (ref 11.4–15.2)

## 2018-02-16 LAB — PREPARE RBC (CROSSMATCH)

## 2018-02-16 LAB — PATHOLOGIST SMEAR REVIEW

## 2018-02-16 LAB — PROTEIN, PLEURAL OR PERITONEAL FLUID: Total protein, fluid: <3 g/dL

## 2018-02-16 LAB — LACTATE DEHYDROGENASE, PLEURAL OR PERITONEAL FLUID: LD, Fluid: 33 U/L — ABNORMAL HIGH (ref 3–23)

## 2018-02-16 LAB — GRAM STAIN

## 2018-02-16 LAB — APTT: aPTT: 35 seconds (ref 24–36)

## 2018-02-16 SURGERY — ESOPHAGOGASTRODUODENOSCOPY (EGD) WITH PROPOFOL
Anesthesia: General

## 2018-02-16 MED ORDER — INSULIN ASPART 100 UNIT/ML ~~LOC~~ SOLN
0.0000 [IU] | Freq: Three times a day (TID) | SUBCUTANEOUS | Status: DC
  Administered 2018-02-16: 5 [IU] via SUBCUTANEOUS
  Administered 2018-02-16: 8 [IU] via SUBCUTANEOUS
  Administered 2018-02-17: 3 [IU] via SUBCUTANEOUS
  Administered 2018-02-17: 5 [IU] via SUBCUTANEOUS
  Administered 2018-02-17: 8 [IU] via SUBCUTANEOUS
  Administered 2018-02-18: 2 [IU] via SUBCUTANEOUS
  Administered 2018-02-18: 5 [IU] via SUBCUTANEOUS
  Administered 2018-02-18: 8 [IU] via SUBCUTANEOUS
  Administered 2018-02-19: 3 [IU] via SUBCUTANEOUS
  Administered 2018-02-19: 5 [IU] via SUBCUTANEOUS
  Administered 2018-02-19: 15 [IU] via SUBCUTANEOUS
  Administered 2018-02-20: 3 [IU] via SUBCUTANEOUS

## 2018-02-16 MED ORDER — OCTREOTIDE LOAD VIA INFUSION
50.0000 ug | Freq: Once | INTRAVENOUS | Status: AC
  Administered 2018-02-16: 50 ug via INTRAVENOUS
  Filled 2018-02-16: qty 25

## 2018-02-16 MED ORDER — INSULIN GLARGINE 100 UNIT/ML ~~LOC~~ SOLN
40.0000 [IU] | Freq: Every day | SUBCUTANEOUS | Status: DC
  Administered 2018-02-16: 40 [IU] via SUBCUTANEOUS
  Filled 2018-02-16: qty 0.4

## 2018-02-16 MED ORDER — SODIUM CHLORIDE 0.9 % IV SOLN: INTRAVENOUS | Status: DC

## 2018-02-16 MED ORDER — LACTULOSE 10 GM/15ML PO SOLN
30.0000 g | Freq: Three times a day (TID) | ORAL | Status: DC
  Administered 2018-02-16 – 2018-02-20 (×12): 30 g via ORAL
  Filled 2018-02-16 (×13): qty 45

## 2018-02-16 MED ORDER — LACTATED RINGERS IV SOLN
INTRAVENOUS | Status: DC | PRN
  Administered 2018-02-16: 14:00:00 via INTRAVENOUS

## 2018-02-16 MED ORDER — LACTATED RINGERS IV SOLN
INTRAVENOUS | Status: DC
  Administered 2018-02-16: 14:00:00 via INTRAVENOUS

## 2018-02-16 MED ORDER — KETAMINE HCL 10 MG/ML IJ SOLN
INTRAMUSCULAR | Status: DC | PRN
  Administered 2018-02-16: 50 mg via INTRAVENOUS

## 2018-02-16 MED ORDER — SODIUM CHLORIDE 0.9 % IV SOLN
50.0000 ug/h | INTRAVENOUS | Status: DC
  Administered 2018-02-16 – 2018-02-19 (×6): 50 ug/h via INTRAVENOUS
  Filled 2018-02-16 (×13): qty 1

## 2018-02-16 MED ORDER — SODIUM CHLORIDE 0.9 % IV SOLN
INTRAVENOUS | Status: AC
  Administered 2018-02-16 (×2): via INTRAVENOUS

## 2018-02-16 MED ORDER — PANTOPRAZOLE SODIUM 40 MG IV SOLR
40.0000 mg | INTRAVENOUS | Status: DC
  Administered 2018-02-16: 40 mg via INTRAVENOUS
  Filled 2018-02-16: qty 40

## 2018-02-16 MED ORDER — PANTOPRAZOLE SODIUM 40 MG IV SOLR
40.0000 mg | Freq: Two times a day (BID) | INTRAVENOUS | Status: DC
  Administered 2018-02-16 – 2018-02-19 (×6): 40 mg via INTRAVENOUS
  Filled 2018-02-16 (×7): qty 40

## 2018-02-16 MED ORDER — SODIUM CHLORIDE 0.9 % IV SOLN
Freq: Once | INTRAVENOUS | Status: AC
  Administered 2018-02-16: 17:00:00 via INTRAVENOUS

## 2018-02-16 MED ORDER — INSULIN GLARGINE 100 UNIT/ML ~~LOC~~ SOLN
40.0000 [IU] | Freq: Every day | SUBCUTANEOUS | Status: DC
  Administered 2018-02-17 – 2018-02-20 (×4): 40 [IU] via SUBCUTANEOUS
  Filled 2018-02-16 (×4): qty 0.4

## 2018-02-16 MED ORDER — ACETAMINOPHEN 325 MG PO TABS
325.0000 mg | ORAL_TABLET | Freq: Four times a day (QID) | ORAL | Status: DC | PRN
  Administered 2018-02-16 – 2018-02-18 (×3): 325 mg via ORAL
  Filled 2018-02-16 (×3): qty 1

## 2018-02-16 MED ORDER — SODIUM CHLORIDE 0.9 % IV SOLN: Freq: Once | INTRAVENOUS | Status: DC

## 2018-02-16 MED ORDER — PROPOFOL 10 MG/ML IV BOLUS
INTRAVENOUS | Status: DC | PRN
  Administered 2018-02-16: 50 mg via INTRAVENOUS

## 2018-02-16 MED ORDER — SUCCINYLCHOLINE CHLORIDE 20 MG/ML IJ SOLN
INTRAMUSCULAR | Status: DC | PRN
Start: 2018-02-16 — End: 2018-02-16
  Administered 2018-02-16: 100 mg via INTRAVENOUS

## 2018-02-16 MED ORDER — KETAMINE HCL-SODIUM CHLORIDE 100-0.9 MG/10ML-% IV SOSY
PREFILLED_SYRINGE | INTRAVENOUS | Status: AC
  Filled 2018-02-16: qty 10

## 2018-02-16 MED ORDER — ONDANSETRON HCL 4 MG/2ML IJ SOLN
4.0000 mg | Freq: Four times a day (QID) | INTRAMUSCULAR | Status: DC | PRN
  Administered 2018-02-16 – 2018-02-19 (×3): 4 mg via INTRAVENOUS
  Filled 2018-02-16 (×3): qty 2

## 2018-02-16 SURGICAL SUPPLY — 14 items

## 2018-02-16 NOTE — Consult Note (Signed)
PULMONARY / CRITICAL CARE MEDICINE   Name: Denise Cuevas MRN: [id number] DOB: 1951/01/04    ADMISSION DATE:  02/15/2018 CONSULTATION DATE:  02/16/18  REFERRING MD:  Dr. Algis Liming  CHIEF COMPLAINT: Mental status change, Acute blood loss  HISTORY OF PRESENT ILLNESS:     Referring Provider:  Dr. Algis Liming  Primary Care Physician:  [name], MD Primary Gastroenterologist:  Dr. Paulita Fujita  Reason for Consultation:  Hepatic encephalopathy, cirrhosis  HPI: Denise Cuevas is a 67 y.o. female with past medical history of cirrhosis probably from [place], status post bioprosthetic aortic valve replacement, history of coronary artery disease, history of diabetes admitted to the hospital with confusion.  Patient seen and examined at bedside. Sister at bedside. Patient is somewhat lethargic but responds to verbal command and was able to provide limited history. According to patient see has been seen likely a stool for last 3 days. Also had dark-colored vomiting one to 2 days ago.complaining of constipation. According to her sister she's been having nausea and vomiting for a few weeks. Denied any bright red blood per rectum.  02/16/18 I WAS ASKED TO SEE THE PATIENT IN THE ENDOSCOPY SUITE TODAY BY dR. HONGALGI. He became concerned as the patient had dropped her Hb about 2 points. There was some respiratory embarrassment after her endoscopy as well. The patient had been intubated for the procedure. At present she is awAke but she is weak with relatively poor respiratory effort. She is able to answer my questions and dOes not appear to be in pain Has not haD ANY brb OR MELENA IN THE ENDOSCOPY SUITE. HER bp IS RUNNING 90-100 SYSTOLIOC PRESENTLY.  Previous GI history ------------------------ Patient was last seen by my partner Dr. Paulita Fujita in January 2019. She was advised to take lactulose. .blood work on 01/06/2018 showed normal creatinine, total bilirubin of 3.5, alkaline phosphatase 196, AST 97 and normal  AST. CBC showed platelet count of 89 and hemoglobin of 12.6.  EGD -08/2014 by Dr. Paulita Fujita showed small esophageal varices and long segment Barrett's. Biopsy was not performed because of underlying esophageal varices. It also showed some nodularity in the antrum.  Colonoscopy 08/2014 showed internal hemorrhoids and small tubular adenoma in  transverse colon  Repeat was recommended in 5 years    Past Medical History:  Diagnosis Date  . Angina   . Asthma   . CHF (congestive heart failure) ([place])   . 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 COPD (chronic obstructive pulmonary disease) ([place])   . Coronary artery disease    s/p CABG  . Depression   . Diabetes mellitus    type 2  . Dyspnea   . Edema extremities    consistent edema lower legs, feet, and right quadrant abdominal pain-"goes and comes"  . Family history of adverse reaction to anesthesia    SISTER HAS NAUSEA  . Fibromyalgia   . GERD (gastroesophageal reflux disease)   . H/O hiatal hernia   . Headache(784.0)    occ. generalized  . Heart murmur   . Hypercholesteremia   . Hypertension   . Hypothyroid   . Myocardial infarction [facility]) 11/2011   MI x2- 3'30,0'76 coronary stent(chest pain episode at time)  . PONV (postoperative nausea and vomiting)     Past Surgical History:  Procedure Laterality Date  . AORTIC VALVE REPLACEMENT N/A 11/12/2017   Procedure: AORTIC VALVE REPLACEMENT (AVR)  USING 19 MM MAGNA EASE PERICARDIAL BIOPROSTHESIS-AORTIC, MODEL 3300TFX;  Surgeon: [name], MD;  Location: [facility] OR;  Service: Open Heart Surgery;  Laterality: N/A;  . ARTERIAL LINE INSERTION Right 11/12/2017   Procedure: ARTERIAL LINE INSERTION;  Surgeon: [name], MD;  Location: [place];  Service: Open Heart Surgery;  Laterality: Right;  placed in right femoral artery.  . CHOLECYSTECTOMY     open many yrs ago  . COLONOSCOPY WITH PROPOFOL N/A 08/24/2014   Procedure: COLONOSCOPY WITH PROPOFOL;  Surgeon: Arta Silence,  MD;  Location: [facility] ENDOSCOPY;  Service: Endoscopy;  Laterality: N/A;  . CORONARY ANGIOPLASTY WITH STENT PLACEMENT  05/06/2012  . CORONARY ARTERY BYPASS GRAFT  11/27/2011   Procedure: CORONARY ARTERY BYPASS GRAFTING (CABG);  Surgeon: [name], MD;  Location: [place];  Service: Open Heart Surgery;  Laterality: N/A;  coronary artery bypass graft on pump times two utilizing left internal mammary artery and right saphenous vein harvested endoscopically   . ESOPHAGOGASTRODUODENOSCOPY (EGD) WITH PROPOFOL N/A 08/24/2014   Procedure: ESOPHAGOGASTRODUODENOSCOPY (EGD) WITH PROPOFOL;  Surgeon: Arta Silence, MD;  Location: [facility] ENDOSCOPY;  Service: Endoscopy;  Laterality: N/A;  . KNEE SURGERY Right    scope surgery  . LEFT HEART CATHETERIZATION WITH CORONARY ANGIOGRAM N/A 11/19/2011   Procedure: LEFT HEART CATHETERIZATION WITH CORONARY ANGIOGRAM;  Surgeon: Candee Furbish, MD;  Location: [facility] CATH LAB;  Service: Cardiovascular;  Laterality: N/A;  . LEFT HEART CATHETERIZATION WITH CORONARY ANGIOGRAM  05/06/2012   Procedure: LEFT HEART CATHETERIZATION WITH CORONARY ANGIOGRAM;  Surgeon: Jettie Booze, MD;  Location: [facility] CATH LAB;  Service: Cardiovascular;;  . LEFT HEART CATHETERIZATION WITH CORONARY/GRAFT ANGIOGRAM N/A 03/24/2012   Procedure: LEFT HEART CATHETERIZATION WITH Beatrix Fetters;  Surgeon: Jettie Booze, MD;  Location: [facility] CATH LAB;  Service: Cardiovascular;  Laterality: N/A;  . MITRAL VALVE REPLACEMENT N/A 11/12/2017   Procedure: MITRAL VALVE (MV) REPLACEMENT USING 25 MM MAGNA MITRAL EASE PERICARDIAL BIOPROSTHESIS, MODEL 7300TFX;  Surgeon: [name], MD;  Location: [place];  Service: Open Heart Surgery;  Laterality: N/A;  Using 14mm Magna Ease Mitral Pericardial Bioprosthesis  . PERCUTANEOUS CORONARY STENT INTERVENTION (PCI-S)  03/24/2012   Procedure: PERCUTANEOUS CORONARY STENT INTERVENTION (PCI-S);  Surgeon: Jettie Booze, MD;  Location: [facility] CATH LAB;  Service:  Cardiovascular;;  . PERCUTANEOUS CORONARY STENT INTERVENTION (PCI-S) Bilateral 05/06/2012   Procedure: PERCUTANEOUS CORONARY STENT INTERVENTION (PCI-S);  Surgeon: Jettie Booze, MD;  Location: [facility] CATH LAB;  Service: Cardiovascular;  Laterality: Bilateral;  . RIGHT/LEFT HEART CATH AND CORONARY/GRAFT ANGIOGRAPHY N/A 10/20/2017   Procedure: RIGHT/LEFT HEART CATH AND CORONARY/GRAFT ANGIOGRAPHY;  Surgeon: Burnell Blanks, MD;  Location: [place] CV LAB;  Service: Cardiovascular;  Laterality: N/A;  . TEE WITHOUT CARDIOVERSION N/A 10/20/2017   Procedure: TRANSESOPHAGEAL ECHOCARDIOGRAM (TEE);  Surgeon: Dorothy Spark, MD;  Location: [facility] ENDOSCOPY;  Service: Cardiovascular;  Laterality: N/A;  . TEE WITHOUT CARDIOVERSION N/A 11/12/2017   Procedure: TRANSESOPHAGEAL ECHOCARDIOGRAM (TEE);  Surgeon: [name], MD;  Location: [place];  Service: Open Heart Surgery;  Laterality: N/A;    Prior to Admission medications   Medication Sig Start Date End Date Taking? Authorizing Provider  albuterol (PROVENTIL HFA;VENTOLIN HFA) 108 (90 BASE) MCG/ACT inhaler Inhale 2 puffs into the lungs every 6 (six) hours as needed for wheezing or shortness of breath. 02/04/15  Yes Ghimire, Henreitta Leber, MD  albuterol (PROVENTIL) (2.5 MG/3ML) 0.083% nebulizer solution Take 3 mLs (2.5 mg total) by nebulization every 2 (two) hours as needed for wheezing. 02/04/15  Yes Ghimire, Henreitta Leber, MD  aspirin 81 MG tablet Take 81  mg by mouth daily.   Yes [provider]  DULoxetine (CYMBALTA) 30 MG capsule Take 30 mg by mouth daily with lunch. In conjunction with one 60 mg capsule to equal a total of 90 milligrams   Yes [provider]  DULoxetine (CYMBALTA) 60 MG capsule Take 60 mg by mouth daily with lunch. In conjunction with one 30 mg capsule to equal a total of 90 milligrams   Yes [provider]  furosemide (LASIX) 40 MG tablet Take 40 mg by mouth daily.   Yes [provider]  HUMALOG  MIX 75/25 KWIKPEN (75-25) 100 UNIT/ML Kwikpen INJECT 60 UNITS SUBCUTANEOUSLY BEFORE BREAKFAST AND 60 UNITS BEFORE EVENING MEAL 09/11/17  Yes [provider]  JARDIANCE 25 MG TABS tablet Take 25 mg daily by mouth. 10/12/17  Yes [provider]  KRISTALOSE 20 g packet Take 20 g by mouth 3 (three) times daily. 01/12/18  Yes [provider]  levothyroxine (SYNTHROID, LEVOTHROID) 50 MCG tablet Take 1 tablet (50 mcg total) by mouth daily before breakfast. 11/28/17  Yes Jerline Pain, MD  mometasone-formoterol (DULERA) 100-5 MCG/ACT AERO Inhale 2 puffs into the lungs 2 (two) times daily as needed for wheezing or shortness of breath.   Yes [provider]  omeprazole-sodium bicarbonate (ZEGERID) 40-1100 MG per capsule Take 1 capsule by mouth daily before breakfast.   Yes [provider]  spironolactone (ALDACTONE) 25 MG tablet Take 1 tablet (25 mg total) by mouth daily. 11/23/16  Yes Hongalgi, Lenis Dickinson, MD  traMADol (ULTRAM) 50 MG tablet Take 1 tablet (50 mg total) by mouth every 6 (six) hours as needed for moderate pain. 11/24/17  Yes Barrett, Erin R, PA-C  XIFAXAN 550 MG TABS tablet Take 550 mg by mouth 2 (two) times daily. 01/20/18  Yes [provider]    Scheduled Meds: . [MAR Hold] DULoxetine  90 mg Oral Q lunch  . [MAR Hold] insulin aspart  0-15 Units Subcutaneous TID WC  . [MAR Hold] insulin glargine  40 Units Subcutaneous Daily  . [MAR Hold] lactulose  30 g Oral TID  . [MAR Hold] levothyroxine  50 mcg Oral QAC breakfast  . [MAR Hold] octreotide  50 mcg Intravenous Once  . pantoprazole (PROTONIX) IV  40 mg Intravenous Q12H  . [MAR Hold] rifaximin  550 mg Oral BID  . [MAR Hold] sodium chloride flush  3 mL Intravenous Q12H   Continuous Infusions: . sodium chloride 75 mL/hr at 02/16/18 1107  . sodium chloride    . sodium chloride    . sodium chloride    . [MAR Hold] cefTRIAXone (ROCEPHIN)  IV Stopped (02/16/18 0040)  . lactated ringers 20 mL/hr  at 02/16/18 1340  . [MAR Hold] octreotide  (SANDOSTATIN)    IV infusion     PRN Meds:.[MAR Hold] acetaminophen, [MAR Hold] albuterol, [MAR Hold] lactulose, [MAR Hold] ondansetron (ZOFRAN) IV  Allergies as of 02/15/2018 - Review Complete 02/15/2018  Allergen Reaction Noted  . Exenatide Nausea And Vomiting 11/19/2011  . Ace inhibitors Other (See Comments) 04/20/2015    Family History  Problem Relation Age of Onset  . Heart disease Father 32       died of MI  . Emphysema Father        smoked  . Peripheral vascular disease Mother 20       bilaterial amputations    Social History   Socioeconomic History  . Marital status: Married    Spouse name: Not on file  .  Number of children: Not on file  . Years of education: Not on file  . Highest education level: Not on file  Social Needs  . Financial resource strain: Not on file  . Food insecurity - worry: Not on file  . Food insecurity - inability: Not on file  . Transportation needs - medical: Not on file  . Transportation needs - non-medical: Not on file  Occupational History  . Occupation: Retired Doctor, hospital  Tobacco Use  . Smoking status: Never Smoker  . Smokeless tobacco: Never Used  Substance and Sexual Activity  . Alcohol use: No  . Drug use: No  . Sexual activity: Yes  Other Topics Concern  . Not on file  Social History Narrative   Retired Naval architect          Review of Systems: Limited review of systems per history of present illness.  Physical Exam: Vital signs: Vitals:   02/16/18 1635 02/16/18 1640  BP: (!) 97/29 (!) 101/33  Pulse: (!) 105 (!) 106  Resp: 20 19  Temp:    SpO2: 97% 97%   Last BM Date: 02/15/18   GI:  Lab Results: Recent Labs    02/15/18 1622 02/16/18 0129 02/16/18 1537  WBC 13.5* 16.4* 17.0*  HGB 9.5* 7.6* 5.0*  HCT 29.4* 24.1* 15.8*  PLT 156 143* PENDING   BMET Recent Labs    02/15/18 1622 02/16/18 0129  NA 131* 133*  K 5.8* 5.5*  CL 100* 101  CO2 18* 17*   GLUCOSE 297* 372*  BUN 31* 36*  CREATININE 1.04* 1.33*  CALCIUM 8.0* 8.1*   LFT Recent Labs    02/16/18 0129  PROT 5.2*  ALBUMIN 2.0*  AST 105*  ALT 42  ALKPHOS 169*  BILITOT 6.1*   PT/INR Recent Labs    02/15/18 2202 02/16/18 0129  LABPROT 19.6* 21.0*  INR 1.68 1.83     Studies/Results: Dg Chest 2 View  Result Date: 02/15/2018 CLINICAL DATA:  Shortness of breath. EXAM: CHEST - 2 VIEW COMPARISON:  Chest x-ray dated January 01, 2018. FINDINGS: The heart size and mediastinal contours are within normal limits. Prior mitral and aortic valve replacements. Normal pulmonary vascularity. Low lung volumes. No focal consolidation, pleural effusion, or pneumothorax. No acute osseous abnormality. Unchanged elevation of the right hemidiaphragm. IMPRESSION: No active cardiopulmonary disease. Electronically Signed   By: Titus Dubin M.D.   On: 02/15/2018 17:05   Ct Abdomen Pelvis W Contrast  Result Date: 02/15/2018 CLINICAL DATA:  The pt is c/o abd pain with nausea and vomiting since Thursday Yellow tent to her skin Cirrhosis of the liver non-alcoholic . No BM in 3 days. Patient has multiple pain complaints due to falling twice down steps x 2 weeks. Primarily right middle back EXAM: CT ABDOMEN AND PELVIS WITH CONTRAST TECHNIQUE: Multidetector CT imaging of the abdomen and pelvis was performed using the standard protocol following bolus administration of intravenous contrast. CONTRAST:  100 mL of Isovue-300 intravenous contrast COMPARISON:  None. FINDINGS: Lower chest: Small right and moderate left pleural effusions. There is dependent opacity in the left lower lobe consistent with atelectasis. Mild atelectasis is seen at the base of the right middle lobe. No evidence of pneumonia or pulmonary edema. Heart is normal in size. There changes from cardiac surgery and aortic valve replacement. Hepatobiliary: Morphologic changes of the liver consistent with cirrhosis. No liver mass or focal lesion.  Gallbladder surgically absent. No bile duct dilation. Pancreas: Unremarkable. No pancreatic ductal dilatation or surrounding  inflammatory changes. Spleen: Spleen top-normal in size measuring 12 x 5 x 12 cm. No splenic mass or focal lesion. Adrenals/Urinary Tract: Adrenal glands are unremarkable. Kidneys are normal, without renal calculi, focal lesion, or hydronephrosis. Bladder is unremarkable. Stomach/Bowel: Small hiatal hernia. Evidence of periesophageal varices. Stomach otherwise unremarkable. Small bowel is normal in caliber. No wall thickening. And no dilation of the colon. No colonic wall thickening. Appendix not visualized. Vascular/Lymphatic: Aortic atherosclerosis.  No aneurysm. There are multiple splenic collaterals. The portal vein is small, but patent. Normal caliber patent splenic vein and superior mesenteric vein. No adenopathy. Reproductive: Uterus and bilateral adnexa are unremarkable. Other: Moderate amount of ascites.  No abdominal wall hernia. Musculoskeletal: No fracture or acute finding. No osteoblastic or osteolytic lesions. IMPRESSION: 1. No acute findings within the abdomen or pelvis. 2. Cirrhosis with portal venous hypertension. No liver masses. Portal venous hypertension is reflected by moderate ascites and upper abdominal venous collaterals. The portal vein is small but patent. 3. No bowel obstruction or inflammation. 4. Aortic atherosclerosis. 5. Moderate left and small right pleural effusions associated with dependent atelectasis. 6. No fracture or acute skeletal abnormality. Electronically Signed   By: Lajean Manes M.D.   On: 02/15/2018 19:22    Impression/Plan: - melena in setting of cirrhosis and portal hypertension. HER LAST HB WAS 5.1 AND THE PATIENT IS GETTING TRANSFUSWED 2 UNUITS OF prbcS -   Patient with history of chronic anemia but her hemoglobin was normal in January -  Hepatic encephalopathy.  Currently on Xifaxan and lactulose. -  Decompensated cirrhosis probably  from [place] The patient had a paracentesis yesterday.There were only 264 WBCs of which 46% wererwe lymphocytes not suggestive of SBP. Abdomen is a not tender or particuaklrly distendxed at present. The patient aPPEARS QUITE JAUNDICED. Haer last bilirubin was 6.1. -  History of aortic bypass to the wall replacement. Was on Coumadin which was discontinued by Dr. [name] last week.     Recommendations - patient and husband reporting intermittent black stools since Thursday.patient's hemoglobin was normal in  The patient just underwent UGI endoscopy with 5 bands placed on varices. There was no major actiuve bleeding site.EGD today with possible band ligation. Patient has been off of Coumadin since last week. - start octreotide drip. -  Continue antibiotic in setting of possible GI bleeding present with cirrhosis. - continue lactulose and rifaximin.      LOS: 1 day   Tarry Kos  MD, FACP 02/16/2018, 4:46 PM  Contact #  [phone] MEDICAL HISTORY :  She  has a past medical history of Angina, Asthma, CHF (congestive heart failure) ([place]), Cirrhosis of liver ([place]) (08/2014), COPD (chronic obstructive pulmonary disease) ([place]), Coronary artery disease, Depression, Diabetes mellitus, Dyspnea, Edema extremities, Family history of adverse reaction to anesthesia, Fibromyalgia, GERD (gastroesophageal reflux disease), H/O hiatal hernia, Headache(784.0), Heart murmur, Hypercholesteremia, Hypertension, Hypothyroid, Myocardial infarction ([place]) (11/2011), and PONV (postoperative nausea and vomiting).  PAST SURGICAL HISTORY: She  has a past surgical history that includes Knee surgery (Right); Coronary angioplasty with stent (05/06/2012); Cholecystectomy; Coronary artery bypass graft (11/27/2011); Esophagogastroduodenoscopy (egd) with propofol (N/A, 08/24/2014); Colonoscopy with propofol (N/A, 08/24/2014); left heart catheterization with coronary angiogram (N/A, 11/19/2011); left heart catheterization with  coronary/graft angiogram (N/A, 03/24/2012); percutaneous coronary stent intervention (pci-s) (03/24/2012); percutaneous coronary stent intervention (pci-s) (Bilateral, 05/06/2012); left heart catheterization with coronary angiogram (05/06/2012); RIGHT/LEFT HEART CATH AND CORONARY/GRAFT ANGIOGRAPHY (N/A, 10/20/2017); TEE without cardioversion (N/A, 10/20/2017); Aortic valve replacement (N/A, 11/12/2017); Mitral valve replacement (N/A, 11/12/2017); TEE  without cardioversion (N/A, 11/12/2017); and ARTERIAL LINE INSERTION (Right, 11/12/2017).  Allergies  Allergen Reactions  . Exenatide Nausea And Vomiting  . Ace Inhibitors Other (See Comments)    pseudoasthma    No current facility-administered medications on file prior to encounter.    Current Outpatient Medications on File Prior to Encounter  Medication Sig  . albuterol (PROVENTIL HFA;VENTOLIN HFA) 108 (90 BASE) MCG/ACT inhaler Inhale 2 puffs into the lungs every 6 (six) hours as needed for wheezing or shortness of breath.  Marland Kitchen albuterol (PROVENTIL) (2.5 MG/3ML) 0.083% nebulizer solution Take 3 mLs (2.5 mg total) by nebulization every 2 (two) hours as needed for wheezing.  Marland Kitchen aspirin 81 MG tablet Take 81 mg by mouth daily.  . DULoxetine (CYMBALTA) 30 MG capsule Take 30 mg by mouth daily with lunch. In conjunction with one 60 mg capsule to equal a total of 90 milligrams  . DULoxetine (CYMBALTA) 60 MG capsule Take 60 mg by mouth daily with lunch. In conjunction with one 30 mg capsule to equal a total of 90 milligrams  . furosemide (LASIX) 40 MG tablet Take 40 mg by mouth daily.  Marland Kitchen HUMALOG MIX 75/25 KWIKPEN (75-25) 100 UNIT/ML Kwikpen INJECT 60 UNITS SUBCUTANEOUSLY BEFORE BREAKFAST AND 60 UNITS BEFORE EVENING MEAL  . JARDIANCE 25 MG TABS tablet Take 25 mg daily by mouth.  Marland Kitchen KRISTALOSE 20 g packet Take 20 g by mouth 3 (three) times daily.  Marland Kitchen levothyroxine (SYNTHROID, LEVOTHROID) 50 MCG tablet Take 1 tablet (50 mcg total) by mouth daily before breakfast.  .  mometasone-formoterol (DULERA) 100-5 MCG/ACT AERO Inhale 2 puffs into the lungs 2 (two) times daily as needed for wheezing or shortness of breath.  Marland Kitchen omeprazole-sodium bicarbonate (ZEGERID) 40-1100 MG per capsule Take 1 capsule by mouth daily before breakfast.  . spironolactone (ALDACTONE) 25 MG tablet Take 1 tablet (25 mg total) by mouth daily.  . traMADol (ULTRAM) 50 MG tablet Take 1 tablet (50 mg total) by mouth every 6 (six) hours as needed for moderate pain.  Marland Kitchen XIFAXAN 550 MG TABS tablet Take 550 mg by mouth 2 (two) times daily.    FAMILY HISTORY:  Her indicated that her mother is deceased. She indicated that her father is deceased. She indicated that only one of her three sisters is alive.   SOCIAL HISTORY: She  reports that  has never smoked. she has never used smokeless tobacco. She reports that she does not drink alcohol or use drugs.     VITAL SIGNS: BP (!) 101/33   Pulse (!) 106   Temp 98 F (36.7 C) (Oral)   Resp 19   Ht 5\' 2"  (1.575 m)   Wt 159 lb 2.8 oz (72.2 kg)   LMP  (LMP Unknown)   SpO2 97%   BMI 29.11 kg/m     INTAKE / OUTPUT: I/O last 3 completed shifts: In: 1000 [I.V.:1000] Out: 350 [Urine:350]  PHYSICAL EXAMINATION: General:  Patient  Appears both acutely and chronically ill. Neuro:  She is awake and speaking. It took patient a failryl long time to recover from anaestesia HEENT: [place]/AT Cardiovascular: RRR s1s2 Lungs:Bilateral BS Abdomen:  Soft, non-tender, mildly distended Musculoskeletal:No gross abnormalities Skin:  jaundiced  LABS:  BMET Recent Labs  Lab 02/15/18 1622 02/16/18 0129  NA 131* 133*  K 5.8* 5.5*  CL 100* 101  CO2 18* 17*  BUN 31* 36*  CREATININE 1.04* 1.33*  GLUCOSE 297* 372*    Electrolytes Recent Labs  Lab 02/15/18 1622 02/16/18 0129  CALCIUM 8.0* 8.1*    CBC Recent Labs  Lab 02/15/18 1622 02/16/18 0129 02/16/18 1537  WBC 13.5* 16.4* 17.0*  HGB 9.5* 7.6* 5.0*  HCT 29.4* 24.1* 15.8*  PLT 156 143*  PENDING    Coag's Recent Labs  Lab 02/15/18 2202 02/16/18 0129  APTT  --  35  INR 1.68 1.83    Sepsis Markers No results for input(s): LATICACIDVEN, PROCALCITON, O2SATVEN in the last 168 hours.  ABG No results for input(s): PHART, PCO2ART, PO2ART in the last 168 hours.  Liver Enzymes Recent Labs  Lab 02/15/18 1622 02/16/18 0129  AST 120* 105*  ALT 47 42  ALKPHOS 190* 169*  BILITOT 5.4* 6.1*  ALBUMIN 1.9* 2.0*    Cardiac Enzymes No results for input(s): TROPONINI, PROBNP in the last 168 hours.  Glucose Recent Labs  Lab 02/15/18 2350 02/16/18 0829 02/16/18 1152  GLUCAP 295* 291* 202*    Imaging Dg Chest 2 View  Result Date: 02/15/2018 CLINICAL DATA:  Shortness of breath. EXAM: CHEST - 2 VIEW COMPARISON:  Chest x-ray dated January 01, 2018. FINDINGS: The heart size and mediastinal contours are within normal limits. Prior mitral and aortic valve replacements. Normal pulmonary vascularity. Low lung volumes. No focal consolidation, pleural effusion, or pneumothorax. No acute osseous abnormality. Unchanged elevation of the right hemidiaphragm. IMPRESSION: No active cardiopulmonary disease. Electronically Signed   By: Titus Dubin M.D.   On: 02/15/2018 17:05   Ct Abdomen Pelvis W Contrast  Result Date: 02/15/2018 CLINICAL DATA:  The pt is c/o abd pain with nausea and vomiting since Thursday Yellow tent to her skin Cirrhosis of the liver non-alcoholic . No BM in 3 days. Patient has multiple pain complaints due to falling twice down steps x 2 weeks. Primarily right middle back EXAM: CT ABDOMEN AND PELVIS WITH CONTRAST TECHNIQUE: Multidetector CT imaging of the abdomen and pelvis was performed using the standard protocol following bolus administration of intravenous contrast. CONTRAST:  100 mL of Isovue-300 intravenous contrast COMPARISON:  None. FINDINGS: Lower chest: Small right and moderate left pleural effusions. There is dependent opacity in the left lower lobe  consistent with atelectasis. Mild atelectasis is seen at the base of the right middle lobe. No evidence of pneumonia or pulmonary edema. Heart is normal in size. There changes from cardiac surgery and aortic valve replacement. Hepatobiliary: Morphologic changes of the liver consistent with cirrhosis. No liver mass or focal lesion. Gallbladder surgically absent. No bile duct dilation. Pancreas: Unremarkable. No pancreatic ductal dilatation or surrounding inflammatory changes. Spleen: Spleen top-normal in size measuring 12 x 5 x 12 cm. No splenic mass or focal lesion. Adrenals/Urinary Tract: Adrenal glands are unremarkable. Kidneys are normal, without renal calculi, focal lesion, or hydronephrosis. Bladder is unremarkable. Stomach/Bowel: Small hiatal hernia. Evidence of periesophageal varices. Stomach otherwise unremarkable. Small bowel is normal in caliber. No wall thickening. And no dilation of the colon. No colonic wall thickening. Appendix not visualized. Vascular/Lymphatic: Aortic atherosclerosis.  No aneurysm. There are multiple splenic collaterals. The portal vein is small, but patent. Normal caliber patent splenic vein and superior mesenteric vein. No adenopathy. Reproductive: Uterus and bilateral adnexa are unremarkable. Other: Moderate amount of ascites.  No abdominal wall hernia. Musculoskeletal: No fracture or acute finding. No osteoblastic or osteolytic lesions. IMPRESSION: 1. No acute findings within the abdomen or pelvis. 2. Cirrhosis with portal venous hypertension. No liver masses. Portal venous hypertension is reflected by moderate ascites and upper abdominal venous collaterals. The portal vein is small but patent. 3. No  bowel obstruction or inflammation. 4. Aortic atherosclerosis. 5. Moderate left and small right pleural effusions associated with dependent atelectasis. 6. No fracture or acute skeletal abnormality. Electronically Signed   By: Lajean Manes M.D.   On: 02/15/2018 19:22      Impression:: Altered mental status Patient has advanced cirrhosis.  She needs to be watched given her compromised mental status. As noted above she is on Rifaximin and lactulose. Will recheck ammonia level. Recent bleeding may have caused some hepatic encephalopathy  Respiratory status It was said to be tenuous post-procedure. Patient  On  A cannula presently. No need for BIPAP or intubation presently.  Acute GI bleed The patient does not appear to have brisk bleeding presently. She just had banding in the endoscopy suite. Will need tyo check her HB in the next few hours post transfusion.  Micheal Likens MD [facility] Pager: [phone]  02/16/2018, 4:44 PM

## 2018-02-16 NOTE — Anesthesia Preprocedure Evaluation (Addendum)
Anesthesia Evaluation  Patient identified by MRN, date of birth, ID band Patient awake    Reviewed: Allergy & Precautions, H&P , Patient's Chart, lab work & pertinent test results, reviewed documented beta blocker date and time   Airway Mallampati: II  TM Distance: >3 FB Neck ROM: full    Dental no notable dental hx.    Pulmonary asthma , COPD,    Pulmonary exam normal breath sounds clear to auscultation       Cardiovascular hypertension, + CAD, + Past MI and +CHF   Rhythm:regular Rate:Normal     Neuro/Psych  Neuromuscular disease    GI/Hepatic   Endo/Other  diabetes  Renal/GU      Musculoskeletal   Abdominal   Peds  Hematology  (+) anemia ,   Anesthesia Other Findings Hypothyroid   Angina   GERD (gastroesophageal reflux disease)    Depression   H/O hiatal hernia    Asthma- no wheezes, clear   Heart murmur    Diabetes mellitus type 2 Cirrhosis of liver   08/2014  Coronary artery disease s/p CABG Myocardial infarction ([place]) 11/2011  MI x2- 9'21,1'94 coronary stent(chest pain episode at time)  CHF (congestive heart failure) ; EF 30 %  Pt reported to Have encephalopathy; she correctly stated her ham, no other purposeful answers forthcoming        Reproductive/Obstetrics                           Anesthesia Physical Anesthesia Plan  ASA: IV  Anesthesia Plan: General   Post-op Pain Management:    Induction: Intravenous  PONV Risk Score and Plan:   Airway Management Planned: Oral ETT  Additional Equipment:   Intra-op Plan:   Post-operative Plan: Extubation in OR  Informed Consent: I have reviewed the patients History and Physical, chart, labs and discussed the procedure including the risks, benefits and alternatives for the proposed anesthesia with the patient or authorized representative who has indicated his/her understanding and acceptance.   Dental Advisory  Given  Plan Discussed with: CRNA and Surgeon  Anesthesia Plan Comments: (  )       Anesthesia Quick Evaluation

## 2018-02-16 NOTE — Op Note (Signed)
[facility] Patient Name: Denise Cuevas Procedure Date : 02/16/2018 MRN: [id number] Attending MD: Otis Brace , MD Date of Birth: 1951-05-03 CSN: [id number] Age: 67 Admit Type: Inpatient Procedure:                Upper GI endoscopy Indications:              Melena, Cirrhosis with suspected esophageal varices Providers:                Otis Brace, MD, Presley Raddle, RN, Charolette Child, Technician Referring MD:              Medicines:                General Anesthesia, patient was intubated prior to                            procedure Complications:            No immediate complications. Estimated Blood Loss:     Estimated blood loss was minimal. Procedure:                Pre-Anesthesia Assessment:                           - Prior to the procedure, a History and Physical                            was performed, and patient medications and                            allergies were reviewed. The patient's tolerance of                            previous anesthesia was also reviewed. The risks                            and benefits of the procedure and the sedation                            options and risks were discussed with the patient.                            All questions were answered, and informed consent                            was obtained. Prior Anticoagulants: The patient has                            taken no previous anticoagulant or antiplatelet                            agents. ASA Grade Assessment: III - A patient with  severe systemic disease. After reviewing the risks                            and benefits, the patient was deemed in                            satisfactory condition to undergo the procedure.                           After obtaining informed consent, the endoscope was                            passed under direct vision. Throughout the   procedure, the patient's blood pressure, pulse, and                            oxygen saturations were monitored continuously. The                            Endoscope was introduced through the mouth, and                            advanced to the second part of duodenum. The upper                            GI endoscopy was technically difficult and complex                            due to presence of food. The patient tolerated the                            procedure well. Scope In: Scope Out: Findings:      Three columns of oozing large (> 5 mm) varices were found in the distal       esophagus,. Stigmata of recent bleeding were evident and red wale signs       were present. Five bands were successfully placed with complete       eradication, resulting in deflation of varices. Bleeding had stopped at       the end of the procedure. Distal esophageal varices has started oozing       blood prior to placement of the bands.      One superficial esophageal ulcer was found in the distal esophagus.      A medium amount of food (residue) was found in the gastric fundus and in       the gastric body.      There is no endoscopic evidence of bleeding, ulceration or varices in       the entire examined stomach.      Patchy mildly erythematous mucosa was found in the second portion of the       duodenum.      The exam of the duodenum was otherwise normal. Impression:               - Bleeding large (> 5 mm) esophageal varices.  Completely eradicated. Banded.                           - Esophageal ulcer.                           - A medium amount of food (residue) in the stomach.                           - Erythematous duodenopathy.                           - No specimens collected. Moderate Sedation:      Gen. anesthesia. Recommendation:           - Return patient to hospital ward for ongoing care.                           - NPO today.                            - Continue present medications.                           - Repeat upper endoscopy in 4 weeks for retreatment. Procedure Code(s):        --- Professional ---                           [phone], Esophagogastroduodenoscopy, flexible,                            transoral; with band ligation of esophageal/gastric                            varices Diagnosis Code(s):        --- Professional ---                           K74.60, Unspecified cirrhosis of liver                           I85.11, Secondary esophageal varices with bleeding                           K22.10, Ulcer of esophagus without bleeding                           K31.89, Other diseases of stomach and duodenum                           K92.1, Melena (includes Hematochezia) CPT copyright 2016 American Medical Association. All rights reserved. The codes documented in this report are preliminary and upon coder review may  be revised to meet current compliance requirements. Otis Brace, MD Otis Brace, MD 02/16/2018 2:53:44 PM Number of Addenda: 0

## 2018-02-16 NOTE — Progress Notes (Signed)
-   Patient hemoglobin was around 5.1, it was checked immediately after the procedure. - Transfuse 2 units of PRBC. - Transfer to stepdown as recommended earlier. - Discussed with patient's sister.  Otis Brace MD, [place] 02/16/2018, 3:41 PM  Contact #  [phone]

## 2018-02-16 NOTE — Progress Notes (Addendum)
PROGRESS NOTE   Denise Cuevas  GXQ:[id number]    DOB: 05-18-1951    DOA: 02/15/2018  PCP: [name], MD   I have briefly reviewed patients previous medical [facility] in [facility].  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  Diastolic, was in her usual state of health until 4 days prior to admission when she started having emesis, inability to keep food down, weakness and altered mental status.  As per my discussion with spouse this morning, no BM for 3-4 days until in the hospital on night of admission and specifically no melena or coffee-ground emesis reported.  However subsequently to GI MD family reported coffee-ground emesis 1-2 days ago and melena.  Admitted for hepatic encephalopathy, acute blood loss anemia, acute kidney injury, hyperkalemia.  [facility] consulted, status post EGD and variceal banding 3/11.  Transfer to stepdown 3/11.  Assessment & Plan:   Active Problems:   Hepatic encephalopathy (HCC)   Acute upper GI bleeding/esophageal ulcer/esophageal varices: [facility] was consulted.  Status post EGD 3/11: Large esophageal varices with evidence of recent and ongoing oozing of blood, status post 5 band placements, distal esophageal clean-based ulcer.  I discussed with Dr. Alessandra Bevels.  Transfer to stepdown for close monitoring and management, octreotide started, increase Protonix to 40 mg IV twice daily, continue IV ceftriaxone, monitor CBCs closely and transfuse to keep hemoglobin around 7 but avoid over transfusion, needs repeat EGD in 4 weeks for further band ligation.  If evidence of ongoing bleeding, she may need TIPS placement by interventional radiology.  Hold aspirin. NPO except sips with meds.  Acute blood loss anemia: Hemoglobin was 11.9-12 g in early January.  Presented with hemoglobin of 9.5.  This dropped to  7.6.  Conflicting history from family.  Now finally known to have GI bleed.  Follow CBCs q. 8 hourly and transfuse if hemoglobin 7 or less and keep around 7 g and not over transfuse due to esophageal varices.  Thrombocytopenia: Secondary to acute blood loss and cirrhosis.  Follow CBCs.  Acute hepatic encephalopathy: Likely secondary to upper GI bleed complicating underlying cirrhosis.  Treat GI bleed.  Lactulose increased to 30 g 3 times a day and titrate to achieve approximately 3 BMs per day.  Avoid narcotics.  Continue rifaximin  Acute kidney injury: Creatinine went up from 1.04-1.33.  Likely related to poor oral intake in the context of GI losses and CT contrast.  Brief and gentle IV fluids.  Strict intake output.  Follow BMP in a.m.  Diuretics held.  Hyperkalemia: Secondary to acute kidney injury.  Held Aldactone.  Follow BMP closely.  Monitor on telemetry.  If potassium does not improve with IV fluids, lactulose alone, consider Kayexalate.  Uncontrolled type II DM: Patient on 75/25 insulin 60 units twice daily PTA.  Here placed on Lantus 40 units daily along with SSI.  CBGs in the 200s.  Monitor closely and adjust as needed.  Due to n.p.o., changed SSI to every 4 hourly.  Hold oral medications.  COPD:: Stable without clinical bronchospasm.  Continue Dulera and as needed bronchodilator nebulizations.  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  As per Dr. Alessandra Bevels, he has discussed prognosis with family.  Underwent diagnostic paracentesis by EDP 3/10 and ascitic fluid not indicative of SBP.  However continue ceftriaxone due to variceal bleed.  Hypothyroid: Continue  Synthroid.  Depression: Continue Cymbalta.  Status post bioprosthetic AVR/MVR.  Chronic diastolic CHF: Has peripheral edema likely due to third spacing but no clinical features suggestive of pulmonary edema.  Holding diuretics due to acute kidney injury.   Monitor closely while gently IV hydrating.  Essential hypertension: Controlled.   DVT prophylaxis: SCDs Code Status: Full Family Communication: Discussed in detail with patient's spouse this morning. Disposition: Admitted initially to medical floor.  Transferred to stepdown 3/11.   Consultants:  [facility]  Procedures:  EGD 3/11 Diagnostic paracentesis performed by EDP 3/10   Antimicrobials:  IV ceftriaxone 3/10 >   Subjective: Seen this morning.  Alert and oriented to self and partly to place.  Lethargic.  Confused and unable to provide much history.  Discussed in detail with patient's spouse via phone.  He stated that she was doing well all of February.  She ate a good meal on Wednesday but on Thursday morning she started having intractable nausea and vomiting.  When I specifically asked him about blood or coffee-ground material, he denied that and stated that she vomited color of food or drink that she had consumed.  He also indicated that she had not had a BM for 3 or 4 days until last night and did not no the color (sister stayed with patient last night).  He reported that patient had been taken off of Coumadin.  ROS: As above.  Objective:  Vitals:   02/15/18 2300 02/15/18 2340 02/16/18 0517 02/16/18 1335  BP: (!) 123/41 (!) 139/38 (!) 130/33 (!) 107/39  Pulse: 96 100 (!) 105 (!) 108  Resp: 18  18 (!) 24  Temp:  97.7 F (36.5 C) 97.9 F (36.6 C) 98.4 F (36.9 C)  TempSrc:  Oral Oral Oral  SpO2: 99% 100% 100% 96%  Weight:  72.2 kg (159 lb 2.8 oz)  72.2 kg (159 lb 2.8 oz)  Height:  5\' 2"  (1.575 m)  5\' 2"  (1.575 m)    Examination:  General exam: Middle-aged female, moderately built and nourished, ill looking, lying comfortably propped up in bed.  Oral mucosa dry.  Scleral and skin icterus. Respiratory system: Slightly diminished breath sounds in the bases but otherwise clear to auscultation.  No wheezing, rhonchi or crackles. Respiratory effort normal. Cardiovascular  system: S1 & S2 heard, RRR. No JVD, murmurs, rubs, gallops or clicks.  1+ pedal edema. Gastrointestinal system: Abdomen is nondistended, soft and nontender. No organomegaly or masses felt. Normal bowel sounds heard. Central nervous system: Lethargic but alert and oriented to self and partly to place. No focal neurological deficits. Extremities: Symmetric 5 x 5 power. Skin: No rashes, lesions or ulcers Psychiatry: Judgement and insight impaired. Mood & affect unable to assess.     Data Reviewed: I have personally reviewed following labs and imaging studies  CBC: Recent Labs  Lab 02/15/18 1622 02/16/18 0129  WBC 13.5* 16.4*  HGB 9.5* 7.6*  HCT 29.4* 24.1*  MCV 89.9 92.3  PLT 156 836*   Basic Metabolic Panel: Recent Labs  Lab 02/15/18 1622 02/16/18 0129  NA 131* 133*  K 5.8* 5.5*  CL 100* 101  CO2 18* 17*  GLUCOSE 297* 372*  BUN 31* 36*  CREATININE 1.04* 1.33*  CALCIUM 8.0* 8.1*   Liver Function Tests: Recent Labs  Lab 02/15/18 1622 02/16/18 0129  AST 120* 105*  ALT 47 42  ALKPHOS 190* 169*  BILITOT 5.4* 6.1*  PROT 5.3* 5.2*  ALBUMIN 1.9* 2.0*   Coagulation Profile: Recent Labs  Lab 02/15/18 2202 02/16/18 0129  INR 1.68 1.83   Cardiac Enzymes: No results for input(s): CKTOTAL, CKMB, CKMBINDEX, TROPONINI in the last 168 hours. HbA1C: No results for input(s): HGBA1C in the last 72 hours. CBG: Recent Labs  Lab 02/15/18 2350 02/16/18 0829 02/16/18 1152  GLUCAP 295* 291* 202*    Recent Results (from the past 240 hour(s))  Gram stain     Status: None   Collection Time: 02/15/18 11:15 PM  Result Value Ref Range Status   Specimen Description PERITONEAL CAVITY  Final   Special Requests NONE  Final   Gram Stain   Final    MODERATE WBC PRESENT,BOTH PMN AND MONONUCLEAR NO ORGANISMS SEEN Performed at [facility], [place] [address], [place], [place] [address]    Report Status 02/16/2018 FINAL  Final         Radiology Studies: Dg Chest 2  View  Result Date: 02/15/2018 CLINICAL DATA:  Shortness of breath. EXAM: CHEST - 2 VIEW COMPARISON:  Chest x-ray dated January 01, 2018. FINDINGS: The heart size and mediastinal contours are within normal limits. Prior mitral and aortic valve replacements. Normal pulmonary vascularity. Low lung volumes. No focal consolidation, pleural effusion, or pneumothorax. No acute osseous abnormality. Unchanged elevation of the right hemidiaphragm. IMPRESSION: No active cardiopulmonary disease. Electronically Signed   By: Titus Dubin M.D.   On: 02/15/2018 17:05   Ct Abdomen Pelvis W Contrast  Result Date: 02/15/2018 CLINICAL DATA:  The pt is c/o abd pain with nausea and vomiting since Thursday Yellow tent to her skin Cirrhosis of the liver non-alcoholic . No BM in 3 days. Patient has multiple pain complaints due to falling twice down steps x 2 weeks. Primarily right middle back EXAM: CT ABDOMEN AND PELVIS WITH CONTRAST TECHNIQUE: Multidetector CT imaging of the abdomen and pelvis was performed using the standard protocol following bolus administration of intravenous contrast. CONTRAST:  100 mL of Isovue-300 intravenous contrast COMPARISON:  None. FINDINGS: Lower chest: Small right and moderate left pleural effusions. There is dependent opacity in the left lower lobe consistent with atelectasis. Mild atelectasis is seen at the base of the right middle lobe. No evidence of pneumonia or pulmonary edema. Heart is normal in size. There changes from cardiac surgery and aortic valve replacement. Hepatobiliary: Morphologic changes of the liver consistent with cirrhosis. No liver mass or focal lesion. Gallbladder surgically absent. No bile duct dilation. Pancreas: Unremarkable. No pancreatic ductal dilatation or surrounding inflammatory changes. Spleen: Spleen top-normal in size measuring 12 x 5 x 12 cm. No splenic mass or focal lesion. Adrenals/Urinary Tract: Adrenal glands are unremarkable. Kidneys are normal, without renal  calculi, focal lesion, or hydronephrosis. Bladder is unremarkable. Stomach/Bowel: Small hiatal hernia. Evidence of periesophageal varices. Stomach otherwise unremarkable. Small bowel is normal in caliber. No wall thickening. And no dilation of the colon. No colonic wall thickening. Appendix not visualized. Vascular/Lymphatic: Aortic atherosclerosis.  No aneurysm. There are multiple splenic collaterals. The portal vein is small, but patent. Normal caliber patent splenic vein and superior mesenteric vein. No adenopathy. Reproductive: Uterus and bilateral adnexa are unremarkable. Other: Moderate amount of ascites.  No abdominal wall hernia. Musculoskeletal: No fracture or acute finding. No osteoblastic or osteolytic lesions. IMPRESSION: 1. No acute findings within the abdomen or pelvis. 2. Cirrhosis with portal venous hypertension. No liver masses. Portal venous hypertension is reflected by moderate ascites and upper abdominal venous collaterals. The portal vein is small but patent. 3. No bowel obstruction or inflammation. 4. Aortic atherosclerosis.  5. Moderate left and small right pleural effusions associated with dependent atelectasis. 6. No fracture or acute skeletal abnormality. Electronically Signed   By: Lajean Manes M.D.   On: 02/15/2018 19:22        Scheduled Meds: . [MAR Hold] DULoxetine  90 mg Oral Q lunch  . [MAR Hold] insulin aspart  0-15 Units Subcutaneous TID WC  . [MAR Hold] insulin glargine  40 Units Subcutaneous Daily  . [MAR Hold] lactulose  30 g Oral TID  . [MAR Hold] levothyroxine  50 mcg Oral QAC breakfast  . [MAR Hold] octreotide  50 mcg Intravenous Once  . pantoprazole (PROTONIX) IV  40 mg Intravenous Q12H  . [MAR Hold] rifaximin  550 mg Oral BID  . [MAR Hold] sodium chloride flush  3 mL Intravenous Q12H   Continuous Infusions: . sodium chloride 75 mL/hr at 02/16/18 1107  . sodium chloride    . [MAR Hold] cefTRIAXone (ROCEPHIN)  IV Stopped (02/16/18 0040)  . lactated  ringers 20 mL/hr at 02/16/18 1340  . [MAR Hold] octreotide  (SANDOSTATIN)    IV infusion       LOS: 1 day     Vernell Leep, MD, FACP, [facility]. [facility] Pager [phone]  If 7PM-7AM, please contact night-coverage [email or web address] Password [facility] 02/16/2018, 3:06 PM

## 2018-02-16 NOTE — H&P (Signed)
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  History is obtained via talking to the patient as well as the husband who accompanies, as well as review of EMR. Symptoms started over the past 2-3 days associated symptoms include nausea and vomiting. Patient is maintained on a regimen of lactulose as well as rifaximin, last bowel movement was 3-4 days prior to admission. Since then she's been able to take by mouth, she's been confused and weak. Some mild shortness of breath. Denies fevers chills.  She was in [facility] with her husband as well as daughter. She reports being mostly independent in her ADLs, just needs help getting out of the tub. She is retired Nutritional therapist principal. She does not drink alcohol.  Family also reports noticing some jaundice. Of note, she was hospitalized in January 2019 with hepatic encephalopathy.    ED Course: Vital signs are normal for mild tachycardia, systolic blood pressure 973Z, respiratory 22. Labs remarkable for leukocytosis at 13.5, hemoglobin 9.5. Ammonia elevated at 112, total bilirubin 5.4, AST of 120, ALT 47, albumin 1.9.  CT of the abdomen and pelvis did not reveal any acute findings, cirrhosis for hypertension was noted, moderate ascites and upper abdominal venous collaterals were noted. Chest x-ray did not show any acute findings.  Patient is given 1 L normal saline and lactulose. Medicine consult further management.  Review of Systems: A complete ROS was obtained; pertinent positives negatives are denoted in the HPI.  Otherwise, all systems are negative.   Past Medical History:  Diagnosis Date  . Angina   . Asthma   . CHF (congestive heart failure) ([place])   . Cirrhosis of liver ([place]) 08/2014   idiopathic  . COPD (chronic obstructive pulmonary disease) ([place])   . Coronary artery disease    s/p CABG  . Depression   . Diabetes mellitus    type 2  . Dyspnea   . Edema extremities    consistent edema lower legs, feet, and right quadrant abdominal pain-"goes and comes"  . Family history of adverse reaction to anesthesia    SISTER HAS NAUSEA  . Fibromyalgia   . GERD (gastroesophageal reflux disease)   . H/O hiatal hernia   . Headache(784.0)    occ. generalized  . Heart murmur   . Hypercholesteremia   . Hypertension   . Hypothyroid   . Myocardial infarction [facility]) 11/2011   MI x2- 3'29,9'24 coronary stent(chest pain episode at time)  . PONV (postoperative nausea and vomiting)    Social History   Socioeconomic History  . Marital status: Married    Spouse name: Not on file  . Number of children: Not on file  . Years of education: Not on file  . Highest education level: Not on file  Social Needs  . Financial resource strain: Not on file  . Food insecurity - worry: Not on file  . Food insecurity - inability: Not on file  . Transportation needs - medical: Not on file  . Transportation needs - non-medical: Not on file  Occupational History  . Occupation: Retired Doctor, hospital  Tobacco Use  . Smoking status: Never Smoker  . Smokeless tobacco: Never Used  Substance and Sexual Activity  . Alcohol use: No  . Drug use: No  . Sexual activity: Yes  Other Topics Concern  . Not on file  Social History Narrative   Retired Naval architect         Family History  Problem Relation Age of Onset  . Heart disease Father 76       died of MI  . Emphysema Father        smoked  . Peripheral vascular disease Mother 39       bilaterial amputations    Physical Exam: Vitals:   02/15/18 2200  02/15/18 2230 02/15/18 2300 02/15/18 2340  BP: (!) 135/54 (!) 144/61 (!) 123/41 (!) 139/38  Pulse: (!) 104 (!) 103 96 100  Resp: (!) 26 16 18    Temp:    97.7 F (36.5 C)  TempSrc:    Oral  SpO2: 99% 98% 99% 100%  Weight:    72.2 kg (159 lb 2.8 oz)  Height:    5\' 2"  (1.575 m)   General: Appears calm and comfortable, jaundiced woman, pleasant and cooperative ENT: Grossly normal hearing, MMM, scleral icterus present Cardiovascular: RRR. 2/6 systolic murmur. No LE edema.  Respiratory: CTA bilaterally. No wheezes or crackles. Normal respiratory effort. Abdomen: Soft, tender to palpation, no rebound Skin: No rash or induration seen on limited exam. Musculoskeletal: Grossly normal tone BUE/BLE. Appropriate ROM.  Psychiatric: Grossly normal mood and affect. Neurologic: Moves all extremities in coordinated fashion. No asterixis noted. She is oriented to year and answers questions appropriately; however, she is slow to respond and her cognition is slowed per family report present.  I have personally reviewed the following labs, culture data, and imaging studies.  Assessment/Plan:  #Hepatic encephalopathy #Sepsis secondary to spontaneous bacterial peritonitis, possible On admission, 3-4 days of constipation, nausea, vomiting, and increasing confusion despite home lactulose and rifaximin.  Ammonia > 100 on admission. On exam, she has abdominal tenderness.  Uncertain what specific trigger to her hepatic encephalopathy; however, given clinical picture, I cannot rule out spontaneous bacterial peritonitis.  ED team performed diagnostic paracentesis, final results pending. Plan: Will increase lactulose frequency x 3 doses to induce bowel movement, if not successful - enema ordered. Continue rifaximin.  Ceftriaxone + albumin ordered to treat SBP.    Holding home furosemide + spironolactone in setting of concern for infection. Follow up paracentesis studies and tailor treatment accordingly.  #Other  problems -Insulin dependent DM: on home humalog 75/26, 60 units BID, transitioned to basal (40 units BID) + correction factor -Hypothyroidism: continue thyroid hormone -Hx of CAD: continue ASA 81 mg -Depression: continue SNRI  DVT prophylaxis: SCD given cirrhosis and bleeding risk Code Status: Full code Disposition Plan: Anticipate D/C home in 2-5 days Consults called: none Admission status: admit to hospital medicine   Cheri Rous, MD [facility] LNLG:[phone]  If 7PM-7AM, please contact night-coverage [email or web address] Password [email or web address]

## 2018-02-16 NOTE — Transfer of Care (Signed)
Immediate Anesthesia Transfer of Care Note  Patient: Denise Cuevas  Procedure(s) Performed: ESOPHAGOGASTRODUODENOSCOPY (EGD) WITH PROPOFOL (N/A )  Patient Location: Endoscopy Unit  Anesthesia Type:General  Level of Consciousness: awake and alert   Airway & Oxygen Therapy: Patient Spontanous Breathing  Post-op Assessment: Report given to RN and Post -op Vital signs reviewed and stable  Post vital signs: Reviewed and stable  Last Vitals:  Vitals:   02/16/18 0517 02/16/18 1335  BP: (!) 130/33 (!) 107/39  Pulse: (!) 105 (!) 108  Resp: 18 (!) 24  Temp: 36.6 C 36.9 C  SpO2: 100% 96%    Last Pain:  Vitals:   02/16/18 1335  TempSrc: Oral  PainSc:          Complications: No apparent anesthesia complications

## 2018-02-16 NOTE — Anesthesia Procedure Notes (Signed)
Procedure Name: Intubation Date/Time: 02/16/2018 2:11 PM Performed by: Eligha Bridegroom, CRNA Pre-anesthesia Checklist: Patient identified, Emergency Drugs available, Suction available, Patient being monitored and Timeout performed Patient Re-evaluated:Patient Re-evaluated prior to induction Oxygen Delivery Method: Circle system utilized Preoxygenation: Pre-oxygenation with 100% oxygen Induction Type: IV induction, Rapid sequence and Cricoid Pressure applied Grade View: Grade I Tube type: Oral Tube size: 7.0 mm Number of attempts: 1 Airway Equipment and Method: Stylet Placement Confirmation: ETT inserted through vocal cords under direct vision,  positive ETCO2 and breath sounds checked- equal and bilateral Secured at: 21 cm Tube secured with: Tape Dental Injury: Teeth and Oropharynx as per pre-operative assessment

## 2018-02-16 NOTE — Consult Note (Signed)
Referring Provider:  Dr. Algis Liming  Primary Care Physician:  [name], MD Primary Gastroenterologist:  Dr. Paulita Fujita  Reason for Consultation:  Hepatic encephalopathy, cirrhosis  HPI: Denise Cuevas is a 67 y.o. female with past medical history of cirrhosis probably from [place], status post bioprosthetic aortic valve replacement, history of coronary artery disease, history of diabetes admitted to the hospital with confusion.  Patient seen and examined at bedside. Sister at bedside. Patient is somewhat lethargic but responds to verbal command and was able to provide limited history. According to patient see has been seen likely a stool for last 3 days. Also had dark-colored vomiting one to 2 days ago.complaining of constipation. According to her sister she's been having nausea and vomiting for a few weeks. Denied any bright red blood per rectum.  Previous GI history ------------------------ Patient was last seen by my partner Dr. Paulita Fujita in January 2019. She was advised to take lactulose. .blood work on 01/06/2018 showed normal creatinine, total bilirubin of 3.5, alkaline phosphatase 196, AST 97 and normal AST. CBC showed platelet count of 89 and hemoglobin of 12.6.  EGD -08/2014 by Dr. Paulita Fujita showed small esophageal varices and long segment Barrett's. Biopsy was not performed because of underlying esophageal varices. It also showed some nodularity in the antrum.  Colonoscopy 08/2014 showed internal hemorrhoids and small tubular adenoma in  transverse colon  Repeat was recommended in 5 years    Past Medical History:  Diagnosis Date  . Angina   . Asthma   . CHF (congestive heart failure) ([place])   . Cirrhosis of liver ([place]) 08/2014   idiopathic  . COPD (chronic obstructive pulmonary disease) ([place])   . Coronary artery disease    s/p CABG  . Depression   . Diabetes mellitus    type 2  . Dyspnea   . Edema extremities    consistent edema lower legs, feet, and right quadrant abdominal  pain-"goes and comes"  . Family history of adverse reaction to anesthesia    SISTER HAS NAUSEA  . Fibromyalgia   . GERD (gastroesophageal reflux disease)   . H/O hiatal hernia   . Headache(784.0)    occ. generalized  . Heart murmur   . Hypercholesteremia   . Hypertension   . Hypothyroid   . Myocardial infarction [facility]) 11/2011   MI x2- 1'74,0'81 coronary stent(chest pain episode at time)  . PONV (postoperative nausea and vomiting)     Past Surgical History:  Procedure Laterality Date  . AORTIC VALVE REPLACEMENT N/A 11/12/2017   Procedure: AORTIC VALVE REPLACEMENT (AVR)  USING 19 MM MAGNA EASE PERICARDIAL BIOPROSTHESIS-AORTIC, MODEL 3300TFX;  Surgeon: [name], MD;  Location: [place];  Service: Open Heart Surgery;  Laterality: N/A;  . ARTERIAL LINE INSERTION Right 11/12/2017   Procedure: ARTERIAL LINE INSERTION;  Surgeon: [name], MD;  Location: [place];  Service: Open Heart Surgery;  Laterality: Right;  placed in right femoral artery.  . CHOLECYSTECTOMY     open many yrs ago  . COLONOSCOPY WITH PROPOFOL N/A 08/24/2014   Procedure: COLONOSCOPY WITH PROPOFOL;  Surgeon: Arta Silence, MD;  Location: [facility];  Service: Endoscopy;  Laterality: N/A;  . CORONARY ANGIOPLASTY WITH STENT PLACEMENT  05/06/2012  . CORONARY ARTERY BYPASS GRAFT  11/27/2011   Procedure: CORONARY ARTERY BYPASS GRAFTING (CABG);  Surgeon: [name], MD;  Location: [place];  Service: Open Heart Surgery;  Laterality: N/A;  coronary artery bypass graft on pump times two utilizing left internal mammary artery and  right saphenous vein harvested endoscopically   . ESOPHAGOGASTRODUODENOSCOPY (EGD) WITH PROPOFOL N/A 08/24/2014   Procedure: ESOPHAGOGASTRODUODENOSCOPY (EGD) WITH PROPOFOL;  Surgeon: Arta Silence, MD;  Location: [facility];  Service: Endoscopy;  Laterality: N/A;  . KNEE SURGERY Right    scope surgery  . LEFT HEART CATHETERIZATION WITH CORONARY ANGIOGRAM N/A 11/19/2011   Procedure:  LEFT HEART CATHETERIZATION WITH CORONARY ANGIOGRAM;  Surgeon: Candee Furbish, MD;  Location: [facility] CATH LAB;  Service: Cardiovascular;  Laterality: N/A;  . LEFT HEART CATHETERIZATION WITH CORONARY ANGIOGRAM  05/06/2012   Procedure: LEFT HEART CATHETERIZATION WITH CORONARY ANGIOGRAM;  Surgeon: Jettie Booze, MD;  Location: [facility] CATH LAB;  Service: Cardiovascular;;  . LEFT HEART CATHETERIZATION WITH CORONARY/GRAFT ANGIOGRAM N/A 03/24/2012   Procedure: LEFT HEART CATHETERIZATION WITH Beatrix Fetters;  Surgeon: Jettie Booze, MD;  Location: [facility] CATH LAB;  Service: Cardiovascular;  Laterality: N/A;  . MITRAL VALVE REPLACEMENT N/A 11/12/2017   Procedure: MITRAL VALVE (MV) REPLACEMENT USING 25 MM MAGNA MITRAL EASE PERICARDIAL BIOPROSTHESIS, MODEL 7300TFX;  Surgeon: [name], MD;  Location: [place];  Service: Open Heart Surgery;  Laterality: N/A;  Using 74mm Magna Ease Mitral Pericardial Bioprosthesis  . PERCUTANEOUS CORONARY STENT INTERVENTION (PCI-S)  03/24/2012   Procedure: PERCUTANEOUS CORONARY STENT INTERVENTION (PCI-S);  Surgeon: Jettie Booze, MD;  Location: [facility] CATH LAB;  Service: Cardiovascular;;  . PERCUTANEOUS CORONARY STENT INTERVENTION (PCI-S) Bilateral 05/06/2012   Procedure: PERCUTANEOUS CORONARY STENT INTERVENTION (PCI-S);  Surgeon: Jettie Booze, MD;  Location: [facility] CATH LAB;  Service: Cardiovascular;  Laterality: Bilateral;  . RIGHT/LEFT HEART CATH AND CORONARY/GRAFT ANGIOGRAPHY N/A 10/20/2017   Procedure: RIGHT/LEFT HEART CATH AND CORONARY/GRAFT ANGIOGRAPHY;  Surgeon: Burnell Blanks, MD;  Location: [place] CV LAB;  Service: Cardiovascular;  Laterality: N/A;  . TEE WITHOUT CARDIOVERSION N/A 10/20/2017   Procedure: TRANSESOPHAGEAL ECHOCARDIOGRAM (TEE);  Surgeon: Dorothy Spark, MD;  Location: [facility] ENDOSCOPY;  Service: Cardiovascular;  Laterality: N/A;  . TEE WITHOUT CARDIOVERSION N/A 11/12/2017   Procedure: TRANSESOPHAGEAL ECHOCARDIOGRAM (TEE);  Surgeon:  [name], MD;  Location: [place];  Service: Open Heart Surgery;  Laterality: N/A;    Prior to Admission medications   Medication Sig Start Date End Date Taking? Authorizing Provider  albuterol (PROVENTIL HFA;VENTOLIN HFA) 108 (90 BASE) MCG/ACT inhaler Inhale 2 puffs into the lungs every 6 (six) hours as needed for wheezing or shortness of breath. 02/04/15  Yes Ghimire, Henreitta Leber, MD  albuterol (PROVENTIL) (2.5 MG/3ML) 0.083% nebulizer solution Take 3 mLs (2.5 mg total) by nebulization every 2 (two) hours as needed for wheezing. 02/04/15  Yes Ghimire, Henreitta Leber, MD  aspirin 81 MG tablet Take 81 mg by mouth daily.   Yes [provider]  DULoxetine (CYMBALTA) 30 MG capsule Take 30 mg by mouth daily with lunch. In conjunction with one 60 mg capsule to equal a total of 90 milligrams   Yes [provider]  DULoxetine (CYMBALTA) 60 MG capsule Take 60 mg by mouth daily with lunch. In conjunction with one 30 mg capsule to equal a total of 90 milligrams   Yes [provider]  furosemide (LASIX) 40 MG tablet Take 40 mg by mouth daily.   Yes [provider]  HUMALOG MIX 75/25 KWIKPEN (75-25) 100 UNIT/ML Kwikpen INJECT 60 UNITS SUBCUTANEOUSLY BEFORE BREAKFAST AND 60 UNITS BEFORE EVENING MEAL 09/11/17  Yes [provider]  JARDIANCE 25 MG TABS tablet Take 25 mg daily by mouth. 10/12/17  Yes [provider]  KRISTALOSE 20 g  packet Take 20 g by mouth 3 (three) times daily. 01/12/18  Yes [provider]  levothyroxine (SYNTHROID, LEVOTHROID) 50 MCG tablet Take 1 tablet (50 mcg total) by mouth daily before breakfast. 11/28/17  Yes Jerline Pain, MD  mometasone-formoterol (DULERA) 100-5 MCG/ACT AERO Inhale 2 puffs into the lungs 2 (two) times daily as needed for wheezing or shortness of breath.   Yes [provider]  omeprazole-sodium bicarbonate (ZEGERID) 40-1100 MG per capsule Take 1 capsule by mouth daily before breakfast.   Yes [provider]  spironolactone (ALDACTONE) 25 MG tablet Take 1 tablet (25 mg total) by mouth daily. 11/23/16  Yes Hongalgi, Lenis Dickinson, MD  traMADol (ULTRAM) 50 MG tablet Take 1 tablet (50 mg total) by mouth every 6 (six) hours as needed for moderate pain. 11/24/17  Yes Barrett, Erin R, PA-C  XIFAXAN 550 MG TABS tablet Take 550 mg by mouth 2 (two) times daily. 01/20/18  Yes [provider]    Scheduled Meds: . aspirin  81 mg Oral Daily  . DULoxetine  90 mg Oral Q lunch  . insulin aspart  0-15 Units Subcutaneous TID WC  . [START ON 02/17/2018] insulin glargine  40 Units Subcutaneous Daily  . lactulose  30 g Oral TID  . levothyroxine  50 mcg Oral QAC breakfast  . pantoprazole (PROTONIX) IV  40 mg Intravenous Q24H  . rifaximin  550 mg Oral BID  . sodium chloride flush  3 mL Intravenous Q12H   Continuous Infusions: . sodium chloride 75 mL/hr at 02/16/18 1107  . cefTRIAXone (ROCEPHIN)  IV Stopped (02/16/18 0040)   PRN Meds:.acetaminophen, albuterol, lactulose, ondansetron (ZOFRAN) IV  Allergies as of 02/15/2018 - Review Complete 02/15/2018  Allergen Reaction Noted  . Exenatide Nausea And Vomiting 11/19/2011  . Ace inhibitors Other (See Comments) 04/20/2015    Family History  Problem Relation Age of Onset  . Heart disease Father 70       died of MI  . Emphysema Father        smoked  . Peripheral vascular disease Mother 47       bilaterial amputations    Social History   Socioeconomic History  . Marital status: Married    Spouse name: Not on file  . Number of children: Not on file  . Years of education: Not on file  . Highest education level: Not on file  Social Needs  . Financial resource strain: Not on file  . Food insecurity - worry: Not on file  . Food insecurity - inability: Not on file  . Transportation needs - medical: Not on file  . Transportation needs - non-medical: Not on file  Occupational History  . Occupation: Retired Doctor, hospital  Tobacco Use  .  Smoking status: Never Smoker  . Smokeless tobacco: Never Used  Substance and Sexual Activity  . Alcohol use: No  . Drug use: No  . Sexual activity: Yes  Other Topics Concern  . Not on file  Social History Narrative   Retired Naval architect          Review of Systems: Limited review of systems per history of present illness.  Physical Exam: Vital signs: Vitals:   02/15/18 2340 02/16/18 0517  BP: (!) 139/38 (!) 130/33  Pulse: 100 (!) 105  Resp:  18  Temp: 97.7 F (36.5 C) 97.9 F (36.6 C)  SpO2: 100% 100%   Last BM Date: 02/15/18 Physical Exam  Constitutional: She appears well-nourished. No distress.  HENT:  Head: Atraumatic.  Mouth/Throat: Oropharynx is clear and moist.  Eyes: EOM are normal. Scleral icterus is present.  Neck: Normal range of motion. Neck supple.  Cardiovascular: Normal rate and regular rhythm.  Murmur heard. Pulmonary/Chest: Effort normal. She has rales.  Abdominal: Soft. Bowel sounds are normal. She exhibits distension. There is no tenderness. There is no rebound and no guarding.  Musculoskeletal: Normal range of motion. She exhibits edema.  Neurological:  Patient is alert and oriented 2. Slow to respond  Skin: Skin is warm. No erythema.  Psychiatric: Her behavior is normal. Judgment normal.  Vitals reviewed.   GI:  Lab Results: Recent Labs    02/15/18 1622 02/16/18 0129  WBC 13.5* 16.4*  HGB 9.5* 7.6*  HCT 29.4* 24.1*  PLT 156 143*   BMET Recent Labs    02/15/18 1622 02/16/18 0129  NA 131* 133*  K 5.8* 5.5*  CL 100* 101  CO2 18* 17*  GLUCOSE 297* 372*  BUN 31* 36*  CREATININE 1.04* 1.33*  CALCIUM 8.0* 8.1*   LFT Recent Labs    02/16/18 0129  PROT 5.2*  ALBUMIN 2.0*  AST 105*  ALT 42  ALKPHOS 169*  BILITOT 6.1*   PT/INR Recent Labs    02/15/18 2202 02/16/18 0129  LABPROT 19.6* 21.0*  INR 1.68 1.83     Studies/Results: Dg Chest 2 View  Result Date: 02/15/2018 CLINICAL DATA:  Shortness of breath.  EXAM: CHEST - 2 VIEW COMPARISON:  Chest x-ray dated January 01, 2018. FINDINGS: The heart size and mediastinal contours are within normal limits. Prior mitral and aortic valve replacements. Normal pulmonary vascularity. Low lung volumes. No focal consolidation, pleural effusion, or pneumothorax. No acute osseous abnormality. Unchanged elevation of the right hemidiaphragm. IMPRESSION: No active cardiopulmonary disease. Electronically Signed   By: Titus Dubin M.D.   On: 02/15/2018 17:05   Ct Abdomen Pelvis W Contrast  Result Date: 02/15/2018 CLINICAL DATA:  The pt is c/o abd pain with nausea and vomiting since Thursday Yellow tent to her skin Cirrhosis of the liver non-alcoholic . No BM in 3 days. Patient has multiple pain complaints due to falling twice down steps x 2 weeks. Primarily right middle back EXAM: CT ABDOMEN AND PELVIS WITH CONTRAST TECHNIQUE: Multidetector CT imaging of the abdomen and pelvis was performed using the standard protocol following bolus administration of intravenous contrast. CONTRAST:  100 mL of Isovue-300 intravenous contrast COMPARISON:  None. FINDINGS: Lower chest: Small right and moderate left pleural effusions. There is dependent opacity in the left lower lobe consistent with atelectasis. Mild atelectasis is seen at the base of the right middle lobe. No evidence of pneumonia or pulmonary edema. Heart is normal in size. There changes from cardiac surgery and aortic valve replacement. Hepatobiliary: Morphologic changes of the liver consistent with cirrhosis. No liver mass or focal lesion. Gallbladder surgically absent. No bile duct dilation. Pancreas: Unremarkable. No pancreatic ductal dilatation or surrounding inflammatory changes. Spleen: Spleen top-normal in size measuring 12 x 5 x 12 cm. No splenic mass or focal lesion. Adrenals/Urinary Tract: Adrenal glands are unremarkable. Kidneys are normal, without renal calculi, focal lesion, or hydronephrosis. Bladder is unremarkable.  Stomach/Bowel: Small hiatal hernia. Evidence of periesophageal varices. Stomach otherwise unremarkable. Small bowel is normal in caliber. No wall thickening. And no dilation of the colon. No colonic wall thickening. Appendix not visualized. Vascular/Lymphatic: Aortic atherosclerosis.  No aneurysm. There are multiple splenic collaterals. The portal vein is small, but patent. Normal caliber patent splenic vein and  superior mesenteric vein. No adenopathy. Reproductive: Uterus and bilateral adnexa are unremarkable. Other: Moderate amount of ascites.  No abdominal wall hernia. Musculoskeletal: No fracture or acute finding. No osteoblastic or osteolytic lesions. IMPRESSION: 1. No acute findings within the abdomen or pelvis. 2. Cirrhosis with portal venous hypertension. No liver masses. Portal venous hypertension is reflected by moderate ascites and upper abdominal venous collaterals. The portal vein is small but patent. 3. No bowel obstruction or inflammation. 4. Aortic atherosclerosis. 5. Moderate left and small right pleural effusions associated with dependent atelectasis. 6. No fracture or acute skeletal abnormality. Electronically Signed   By: Lajean Manes M.D.   On: 02/15/2018 19:22    Impression/Plan: - melena in setting of cirrhosis and portal hypertension. - Anemia with hemoglobin down to 7.6 today. Patient with history of chronic anemia but her hemoglobin was normal in January - Hepatic encephalopathy. Currently on Xifaxan and lactulose. - Decompensated cirrhosis probably from [place]  - History of aortic bypass to the wall replacement. Was on Coumadin which was discontinued by Dr. [name] last week.  - Ascites. Status post diagnostic paracentesis. Fluid analysis negative for SBP.  Recommendations - patient and husband reporting intermittent black stools since Thursday.patient's hemoglobin was normal in January which is down to 7.6 today. - I personally spoke with the patient's husband over the phone  along with patient's sister who was at  bedside - Plan for EGD today with possible band ligation. Patient has been off of Coumadin since last week. - start octreotide drip. - Continue antibiotic in setting of possible GI bleeding present with cirrhosis. - continue lactulose and rifaximin.  Risks (bleeding, infection, bowel perforation that could require surgery, sedation-related changes in cardiopulmonary systems), benefits (identification and possible treatment of source of symptoms, exclusion of certain causes of symptoms), and alternatives (watchful waiting, radiographic imaging studies, empiric medical treatment)  were explained to family in detail and patient wishes to proceed.    LOS: 1 day   Otis Brace  MD, FACP 02/16/2018, 11:58 AM  Contact #  [phone]

## 2018-02-16 NOTE — Progress Notes (Addendum)
Addendum  Patient seen in GI endoscopy suite post procedure. Sister at bedside. As per RN, patient more lethargic and BP's soft and drifting down. I Stat Hb 5.1  Patient lying comfortably supine. States "I feel goodish". Denies complaints.  Does not appear in any distress. BP: 99/23, P 104/min, Tele: ST in low 100's, O2 sats: 96% on RA, RR 20/min.  RS, CVS, Abd exam not much changed compared to this morning on floor.   CNS: does appear more lethargic than this morning.  A/P  1. ABLA: Hb has dropped further to 5.1. Starting 2 units PRBC transfusion. Follow CBC post transfusion and closely thereafter. 2. Acute Hepatic Encephalopathy: likely worsened by GA meds and GI bleed. Currently protecting airway but concerned may not if MS worsens rapidly.   Plan was initially to transfer to SDU. However with updated info and bedside evaluation, she is at high risk for rapid decline from Acute UGIB, ABLA, concern for hypotension/shock & worsening MS changes. Hence arranged for transfer to ICU for close monitoring and Mx. I consulted and personally d/w Dr. Debbora Dus, CCM MD and with GI Endoscopy RN. I updated Dr. Alessandra Bevels, GI.  Time spent: 60 minutes critical care time involved in reassessing patient at bedside in endo, reviewing updated clinicals and labs, consulting and coordinating care with specialists and updating family at bedside.   Vernell Leep, MD, FACP, [facility]. [facility] Pager [phone]  If 7PM-7AM, please contact night-coverage [email or web address] Password [facility] 02/16/2018, 4:15 PM

## 2018-02-16 NOTE — Progress Notes (Signed)
CRITICAL VALUE ALERT  Critical Value:  Hgb 5  Date & Time Notied:  02/16/18 1632  Provider Notified: consistent with previously reported istat value  Orders Received/Actions taken: receiving 1st of 2 units PRBC

## 2018-02-16 NOTE — Brief Op Note (Signed)
02/15/2018 - 02/16/2018  2:53 PM  PATIENT:  Denise Cuevas  67 y.o. female  PRE-OPERATIVE DIAGNOSIS:  Melena  POST-OPERATIVE DIAGNOSIS:  esophageal varacies- banding X 5.  PROCEDURE:  Procedure(s): ESOPHAGOGASTRODUODENOSCOPY (EGD) WITH PROPOFOL (N/A)  SURGEON:  Surgeon(s) and Role:    * Markeya Mincy, MD - Primary  Findings/recommendations ----------------------------------- - EGD showed large esophageal varices with evidence of recent and ongoing oozing of blood. Status post 5 bands placement. - EGD also showed distal esophageal clean-based ulcer.  Recommendations ---------------------- - Transfer patient to stepdown - Continue octreotide for total 72 hours - IV twice a day PPI - Continue antibiotics - Monitor H&H. Transfuse to keep hemoglobin around 7. Avoid over transfusion. - repeat  EGD in 4 weeks for further band ligation. - Case discussed with attending Dr. Algis Liming - If evidence of ongoing bleeding, she may need TIPS placement by interventional radiology.  Otis Brace MD, [place] 02/16/2018, 2:55 PM  Contact #  [phone]

## 2018-02-16 NOTE — Progress Notes (Signed)
Inpatient Diabetes Program Recommendations  AACE/ADA: New Consensus Statement on Inpatient Glycemic Control (2015)  Target Ranges:  Prepandial:   less than 140 mg/dL      Peak postprandial:   less than 180 mg/dL (1-2 hours)      Critically ill patients:  140 - 180 mg/dL   Lab Results  Component Value Date   GLUCAP 291 (H) 02/16/2018   HGBA1C 7.5 (H) 11/10/2017   Review of Glycemic Control  Diabetes history: DM 2 Outpatient Diabetes medications: 75/25 60 units BID (equivalent of 90 units basal and 30 units short acting), Jardiance 25 mg Daily Current orders for Inpatient glycemic control: Lantus 40 units Daily, Novolog Moderate Correction 0-15 units tid   Inpatient Diabetes Program Recommendations:    Glucose 290's this am and received 40 of Lantus this am. Will watch trends for today.  Thanks,  Tama Headings RN, MSN, BC-ADM, [facility] Inpatient Diabetes Coordinator Team Pager [phone] (8a-5p)

## 2018-02-17 ENCOUNTER — Encounter (HOSPITAL_COMMUNITY): Payer: Self-pay | Admitting: Gastroenterology

## 2018-02-17 DIAGNOSIS — K7290 Hepatic failure, unspecified without coma: Secondary | ICD-10-CM

## 2018-02-17 DIAGNOSIS — K922 Gastrointestinal hemorrhage, unspecified: Secondary | ICD-10-CM

## 2018-02-17 DIAGNOSIS — R0602 Shortness of breath: Secondary | ICD-10-CM

## 2018-02-17 LAB — PROTIME-INR
INR: 1.91
PROTHROMBIN TIME: 21.7 s — AB (ref 11.4–15.2)

## 2018-02-17 LAB — GLUCOSE, CAPILLARY
GLUCOSE-CAPILLARY: 230 mg/dL — AB (ref 65–99)
Glucose-Capillary: 251 mg/dL — ABNORMAL HIGH (ref 65–99)
Glucose-Capillary: 161 mg/dL — ABNORMAL HIGH (ref 65–99)
Glucose-Capillary: 158 mg/dL — ABNORMAL HIGH (ref 65–99)
Glucose-Capillary: 238 mg/dL — ABNORMAL HIGH (ref 65–99)

## 2018-02-17 LAB — COMPREHENSIVE METABOLIC PANEL WITH GFR
ALT: 46 U/L (ref 14–54)
AST: 113 U/L — ABNORMAL HIGH (ref 15–41)
Albumin: 3.2 g/dL — ABNORMAL LOW (ref 3.5–5.0)
Alkaline Phosphatase: 133 U/L — ABNORMAL HIGH (ref 38–126)
Anion gap: 9 (ref 5–15)
BUN: 44 mg/dL — ABNORMAL HIGH (ref 6–20)
CO2: 22 mmol/L (ref 22–32)
Calcium: 8.5 mg/dL — ABNORMAL LOW (ref 8.9–10.3)
Chloride: 101 mmol/L (ref 101–111)
Creatinine, Ser: 1.23 mg/dL — ABNORMAL HIGH (ref 0.44–1.00)
GFR calc Af Amer: 53 mL/min — ABNORMAL LOW
GFR calc non Af Amer: 45 mL/min — ABNORMAL LOW
Glucose, Bld: 250 mg/dL — ABNORMAL HIGH (ref 65–99)
Potassium: 4.4 mmol/L (ref 3.5–5.1)
Sodium: 132 mmol/L — ABNORMAL LOW (ref 135–145)
Total Bilirubin: 7.2 mg/dL — ABNORMAL HIGH (ref 0.3–1.2)
Total Protein: 5.7 g/dL — ABNORMAL LOW (ref 6.5–8.1)

## 2018-02-17 LAB — BPAM RBC
Blood Product Expiration Date: 201903242359
Blood Product Expiration Date: 201903242359
ISSUE DATE / TIME: 201903111604
ISSUE DATE / TIME: 201903111826
Unit Type and Rh: 6200
Unit Type and Rh: 6200

## 2018-02-17 LAB — TYPE AND SCREEN
ABO/RH(D): A POS
Antibody Screen: NEGATIVE
Unit division: 0
Unit division: 0

## 2018-02-17 LAB — CBC
HCT: 22.7 % — ABNORMAL LOW (ref 36.0–46.0)
HEMOGLOBIN: 7.3 g/dL — AB (ref 12.0–15.0)
MCH: 28.6 pg (ref 26.0–34.0)
MCHC: 32.2 g/dL (ref 30.0–36.0)
MCV: 89 fL (ref 78.0–100.0)
PLATELETS: 72 10*3/uL — AB (ref 150–400)
RBC: 2.55 MIL/uL — ABNORMAL LOW (ref 3.87–5.11)
RDW: 18.4 % — ABNORMAL HIGH (ref 11.5–15.5)
WBC: 11 10*3/uL — ABNORMAL HIGH (ref 4.0–10.5)

## 2018-02-17 LAB — POCT I-STAT 7, (LYTES, BLD GAS, ICA,H+H)
ACID-BASE DEFICIT: 8 mmol/L — AB (ref 0.0–2.0)
BICARBONATE: 16.2 mmol/L — AB (ref 20.0–28.0)
Calcium, Ion: 1.1 mmol/L — ABNORMAL LOW (ref 1.15–1.40)
HCT: 15 % — ABNORMAL LOW (ref 36.0–46.0)
Hemoglobin: 5.1 g/dL — CL (ref 12.0–15.0)
O2 Saturation: 96 %
PCO2 ART: 26.9 mmHg — AB (ref 32.0–48.0)
PH ART: 7.388 (ref 7.350–7.450)
PO2 ART: 80 mmHg — AB (ref 83.0–108.0)
Potassium: 4.7 mmol/L (ref 3.5–5.1)
Sodium: 133 mmol/L — ABNORMAL LOW (ref 135–145)
TCO2: 17 mmol/L — ABNORMAL LOW (ref 22–32)

## 2018-02-17 LAB — PREPARE RBC (CROSSMATCH)

## 2018-02-17 LAB — AMMONIA
Ammonia: 58 umol/L — ABNORMAL HIGH (ref 9–35)
Ammonia: 37 umol/L — ABNORMAL HIGH (ref 9–35)

## 2018-02-17 MED ORDER — GERHARDT'S BUTT CREAM
TOPICAL_CREAM | Freq: Three times a day (TID) | CUTANEOUS | Status: DC | PRN
  Administered 2018-02-17: 23:00:00 via TOPICAL
  Filled 2018-02-17: qty 1

## 2018-02-17 MED ORDER — GUAIFENESIN-DM 100-10 MG/5ML PO SYRP
5.0000 mL | ORAL_SOLUTION | ORAL | Status: DC | PRN
  Administered 2018-02-17 – 2018-02-18 (×2): 5 mL via ORAL
  Filled 2018-02-17 (×3): qty 5

## 2018-02-17 MED ORDER — VITAMIN K1 10 MG/ML IJ SOLN
10.0000 mg | Freq: Once | INTRAVENOUS | Status: AC
  Administered 2018-02-17: 10 mg via INTRAVENOUS
  Filled 2018-02-17: qty 1

## 2018-02-17 NOTE — Progress Notes (Signed)
[facility] Gastroenterology Progress Note  Denise Cuevas 67 y.o. 1951/09/27  CC:  Variceal bleeding, encephalopathy, cirrhosis   Subjective: Patient remains somewhat encephalopathic but responds to verbal command. Sister at bedside. Discussed with the nursing staff. No further bleeding episodes .  ROS : Not able to obtain   Objective: Vital signs in last 24 hours: Vitals:   02/17/18 0800 02/17/18 0900  BP: (!) 117/54 (!) 105/51  Pulse: 92 91  Resp: 20 15  Temp:    SpO2: 98% 95%    Physical Exam:  Gen. Lethargic but responds to verbal command Heart. Regular rhythm regular. Murmur present. Abdomen. Soft, mild distended, nontender, heart sounds present. No peritoneal signs  Lab Results: Recent Labs    02/16/18 1505 02/16/18 1524 02/17/18 0734  NA 130* 133* 132*  K 4.4 4.7 4.4  CL 98*  --  101  CO2 16*  --  22  GLUCOSE 219*  --  250*  BUN 44*  --  44*  CREATININE 1.29*  --  1.23*  CALCIUM 8.7*  --  8.5*   Recent Labs    02/16/18 1505 02/17/18 0734  AST 88* 113*  ALT 34 46  ALKPHOS 127* 133*  BILITOT 5.2* 7.2*  PROT 5.5* 5.7*  ALBUMIN 3.4* 3.2*   Recent Labs    02/16/18 1537 02/16/18 2318 02/17/18 0734  WBC 17.0*  --  11.0*  HGB 5.0* 7.7* 7.3*  HCT 15.8* 22.9* 22.7*  MCV 92.4  --  89.0  PLT 104*  --  72*   Recent Labs    02/16/18 0129 02/17/18 0734  LABPROT 21.0* 21.7*  INR 1.83 1.91      Assessment/Plan: - Variceal bleeding. Status post EGD with 5 bands placement yesterday. - Acute blood loss anemia. Hemoglobin was down to 5. Hemoglobin improved after blood transfusion. Current hemoglobin is 7.3. - Hepatic encephalopathy. Currently on Xifaxan and lactulose. - Decompensated cirrhosis probably from [place] . MELD 26  - History of aortic bypass to the wall replacement. Was on Coumadin which was discontinued by Dr. [name] last week.  - Ascites. Status post diagnostic paracentesis. Fluid analysis negative for  SBP.  Recommendations ------------------------- - Patient with no further bleeding episodes. - Start full liquid diet - Continue octreotide for another 48 hours. Continue IV twice a day PPI and antibiotics. - Continue lactulose and rifaximin. - Patient with advancement score. Long-term poor prognosis discussed with patient's sister.. According to sister, family does not want to pursue liver transplant - GI will follow.  Otis Brace MD, [place] 02/17/2018, 10:02 AM  Contact #  [phone]

## 2018-02-17 NOTE — Progress Notes (Signed)
PULMONARY / CRITICAL CARE MEDICINE   Name: Denise Cuevas MRN: [id number] DOB: 11-15-1951    ADMISSION DATE:  02/15/2018 CONSULTATION DATE:  02/16/18  REFERRING MD:  Dr. Algis Liming  CHIEF COMPLAINT: Mental status change, Acute blood loss  HISTORY OF PRESENT ILLNESS:     Referring Provider:  Dr. Algis Liming  Primary Care Physician:  [name], MD Primary Gastroenterologist:  Dr. Paulita Fujita  Reason for Consultation:  Hepatic encephalopathy, cirrhosis  HPI: Denise Cuevas is a 67 y.o. female with past medical history of cirrhosis probably from [place], status post bioprosthetic aortic valve replacement, history of coronary artery disease, history of diabetes admitted to the hospital with confusion.  Patient seen and examined at bedside. Sister at bedside. Patient is somewhat lethargic but responds to verbal command and was able to provide limited history. According to patient see has been seen likely a stool for last 3 days. Also had dark-colored vomiting one to 2 days ago.complaining of constipation. According to her sister she's been having nausea and vomiting for a few weeks. Denied any bright red blood per rectum.  02/16/18 I WAS ASKED TO SEE THE PATIENT IN THE ENDOSCOPY SUITE TODAY BY dR. HONGALGI. He became concerned as the patient had dropped her Hb about 2 points. There was some respiratory embarrassment after her endoscopy as well. The patient had been intubated for the procedure. At present she is awAke but she is weak with relatively poor respiratory effort. She is able to answer my questions and dOes not appear to be in pain Has not haD ANY brb OR MELENA IN THE ENDOSCOPY SUITE. HER bp IS RUNNING 90-100 SYSTOLIOC PRESENTLY.  Previous GI history ------------------------ Patient was last seen by my partner Dr. Paulita Fujita in January 2019. She was advised to take lactulose. .blood work on 01/06/2018 showed normal creatinine, total bilirubin of 3.5, alkaline phosphatase 196, AST 97 and normal  AST. CBC showed platelet count of 89 and hemoglobin of 12.6.  EGD -08/2014 by Dr. Paulita Fujita showed small esophageal varices and long segment Barrett's. Biopsy was not performed because of underlying esophageal varices. It also showed some nodularity in the antrum.  Colonoscopy 08/2014 showed internal hemorrhoids and small tubular adenoma in  transverse colon  Repeat was recommended in 5 years   Subjective/interval events:  Underwent banding x5, received 2 units packed red blood cells for hemoglobin 5.1, rebounded to 7.7 Slightly somnolent but wakes and interacts appropriately Ammonia improved from 112-58-37  VITAL SIGNS: BP (!) 111/50   Pulse 91   Temp 98.5 F (36.9 C) (Oral)   Resp 15   Ht 5\' 2"  (1.575 m)   Wt 72 kg (158 lb 11.7 oz)   LMP  (LMP Unknown)   SpO2 98%   BMI 29.03 kg/m      INTAKE / OUTPUT: I/O last 3 completed shifts: In: 2757.6 [I.V.:1864.3; Blood:693.3; IV Piggyback:200] Out: 350 [Urine:350]  PHYSICAL EXAMINATION: General: Chronically ill, hypersomnolent Neuro: Awake with stimulation, interacts, able to follow commands, answer questions HEENT: No oral lesions, icteric sclera Cardiovascular: Regular, no murmur Lungs: Clear bilaterally, decreased at both bases Abdomen: Mildly distended, no tenderness, positive bowel sounds Musculoskeletal: No deformity Skin: Jaundiced skin  LABS:  BMET Recent Labs  Lab 02/16/18 0129 02/16/18 1505 02/16/18 1524 02/17/18 0734  NA 133* 130* 133* 132*  K 5.5* 4.4 4.7 4.4  CL 101 98*  --  101  CO2 17* 16*  --  22  BUN 36* 44*  --  44*  CREATININE 1.33* 1.29*  --  1.23*  GLUCOSE 372* 219*  --  250*    Electrolytes Recent Labs  Lab 02/16/18 0129 02/16/18 1505 02/17/18 0734  CALCIUM 8.1* 8.7* 8.5*    CBC Recent Labs  Lab 02/16/18 0129  02/16/18 1537 02/16/18 2318 02/17/18 0734  WBC 16.4*  --  17.0*  --  11.0*  HGB 7.6*   < > 5.0* 7.7* 7.3*  HCT 24.1*   < > 15.8* 22.9* 22.7*  PLT 143*  --  104*  --  72*    < > = values in this interval not displayed.    Coag's Recent Labs  Lab 02/15/18 2202 02/16/18 0129 02/17/18 0734  APTT  --  35  --   INR 1.68 1.83 1.91    Sepsis Markers No results for input(s): LATICACIDVEN, PROCALCITON, O2SATVEN in the last 168 hours.  ABG Recent Labs  Lab 02/16/18 1524  PHART 7.388  PCO2ART 26.9*  PO2ART 80.0*    Liver Enzymes Recent Labs  Lab 02/16/18 0129 02/16/18 1505 02/17/18 0734  AST 105* 88* 113*  ALT 42 34 46  ALKPHOS 169* 127* 133*  BILITOT 6.1* 5.2* 7.2*  ALBUMIN 2.0* 3.4* 3.2*    Cardiac Enzymes No results for input(s): TROPONINI, PROBNP in the last 168 hours.  Glucose Recent Labs  Lab 02/15/18 2350 02/16/18 0829 02/16/18 1152 02/16/18 1806 02/16/18 2147 02/17/18 0748  GLUCAP 295* 291* 202* 241* 262* 251*    Imaging No results found.   Impression:: Hx NASH cirrhosis  Acute GIB due to esophageal varices, status post banding 3/11 Acute blood loss anemia status post transfusion 2 units 3/11 Hepatic encephalopathy, acute on chronic, improving Hepatic coagulopathy Chronic renal insufficiency, stable History of CAD/CABG Insulin-dependent diabetes  Plans:  Hemo-dynamically stable, hemoglobin stabilizing.  We will follow serial CBC Continue lactulose and rifaximin as ordered, follow ammonia, trend has been decreasing Continue octreotide for another 48 hours, pantoprazole as directed by gastroenterology We can transition her to a stepdown bed on 3/12. I will ask TRH to resume her care as of 3/13.    Baltazar Apo, MD, PhD 02/17/2018, 11:42 AM  Pulmonary and Critical Care [phone] or if no answer [phone]

## 2018-02-17 NOTE — Progress Notes (Signed)
Inpatient Diabetes Program Recommendations  AACE/ADA: New Consensus Statement on Inpatient Glycemic Control (2015)  Target Ranges:  Prepandial:   less than 140 mg/dL      Peak postprandial:   less than 180 mg/dL (1-2 hours)      Critically ill patients:  140 - 180 mg/dL   Lab Results  Component Value Date   GLUCAP 251 (H) 02/17/2018   HGBA1C 7.5 (H) 11/10/2017    Review of Glycemic ControlResults for Denise Cuevas, Denise Cuevas (MRN [id number]) as of 02/17/2018 10:38  Ref. Range 02/16/2018 08:29 02/16/2018 11:52 02/16/2018 18:06 02/16/2018 21:47 02/17/2018 07:48  Glucose-Capillary Latest Ref Range: 65 - 99 mg/dL 291 (H) 202 (H) 241 (H) 262 (H) 251 (H)    Diabetes history: Type 2 DM Outpatient Diabetes medications: 75/25 60 units bid Current orders for Inpatient glycemic control:  Lantus 40 units daily, Novolog moderate tid with meals  Inpatient Diabetes Program Recommendations:    Please consider increasing Lantus to 50 units daily and increase frequency of Novolog correction to q 4 hours.    Thanks,  Adah Perl, RN, BC-ADM Inpatient Diabetes Coordinator Pager [phone] (8a-5p)

## 2018-02-17 NOTE — Anesthesia Postprocedure Evaluation (Signed)
Anesthesia Post Note  Patient: Denise Cuevas  Procedure(s) Performed: ESOPHAGOGASTRODUODENOSCOPY (EGD) WITH PROPOFOL (N/A )     Patient location during evaluation: PACU Anesthesia Type: General Level of consciousness: awake and alert Pain management: pain level controlled Vital Signs Assessment: post-procedure vital signs reviewed and stable Respiratory status: spontaneous breathing, nonlabored ventilation, respiratory function stable and patient connected to nasal cannula oxygen Cardiovascular status: blood pressure returned to baseline and stable Postop Assessment: no apparent nausea or vomiting Anesthetic complications: no Comments: Will transfuse PRBC; To ICU post procedure    Last Vitals:  Vitals:   02/17/18 1300 02/17/18 1400  BP: (!) 112/51 115/60  Pulse: 92 92  Resp: 16 15  Temp:    SpO2: 98% 98%    Last Pain:  Vitals:   02/17/18 1139  TempSrc: Oral  PainSc:                  Riccardo Dubin

## 2018-02-17 NOTE — Progress Notes (Signed)
As patient currently ICU status will defer management to ICU team-please let me know when transferring out-will re-assume care

## 2018-02-18 LAB — GLUCOSE, CAPILLARY
GLUCOSE-CAPILLARY: 203 mg/dL — AB (ref 65–99)
Glucose-Capillary: 166 mg/dL — ABNORMAL HIGH (ref 65–99)
Glucose-Capillary: 291 mg/dL — ABNORMAL HIGH (ref 65–99)
Glucose-Capillary: 177 mg/dL — ABNORMAL HIGH (ref 65–99)

## 2018-02-18 LAB — CBC
HEMATOCRIT: 23.3 % — AB (ref 36.0–46.0)
HEMOGLOBIN: 7.8 g/dL — AB (ref 12.0–15.0)
MCH: 30.2 pg (ref 26.0–34.0)
MCHC: 33.5 g/dL (ref 30.0–36.0)
MCV: 90.3 fL (ref 78.0–100.0)
Platelets: 68 10*3/uL — ABNORMAL LOW (ref 150–400)
RBC: 2.58 MIL/uL — AB (ref 3.87–5.11)
RDW: 19.9 % — ABNORMAL HIGH (ref 11.5–15.5)
WBC: 8.9 10*3/uL (ref 4.0–10.5)

## 2018-02-18 LAB — COMPREHENSIVE METABOLIC PANEL WITH GFR
ALT: 54 U/L (ref 14–54)
AST: 137 U/L — ABNORMAL HIGH (ref 15–41)
Albumin: 2.8 g/dL — ABNORMAL LOW (ref 3.5–5.0)
Alkaline Phosphatase: 137 U/L — ABNORMAL HIGH (ref 38–126)
Anion gap: 8 (ref 5–15)
BUN: 32 mg/dL — ABNORMAL HIGH (ref 6–20)
CO2: 21 mmol/L — ABNORMAL LOW (ref 22–32)
Calcium: 8.3 mg/dL — ABNORMAL LOW (ref 8.9–10.3)
Chloride: 103 mmol/L (ref 101–111)
Creatinine, Ser: 0.99 mg/dL (ref 0.44–1.00)
GFR calc Af Amer: >60 mL/min
GFR calc non Af Amer: 59 mL/min — ABNORMAL LOW
Glucose, Bld: 229 mg/dL — ABNORMAL HIGH (ref 65–99)
Potassium: 3.9 mmol/L (ref 3.5–5.1)
Sodium: 132 mmol/L — ABNORMAL LOW (ref 135–145)
Total Bilirubin: 5 mg/dL — ABNORMAL HIGH (ref 0.3–1.2)
Total Protein: 5.3 g/dL — ABNORMAL LOW (ref 6.5–8.1)

## 2018-02-18 LAB — PROTIME-INR
INR: 1.69
Prothrombin Time: 19.7 s — ABNORMAL HIGH (ref 11.4–15.2)

## 2018-02-18 MED ORDER — WHITE PETROLATUM EX OINT
TOPICAL_OINTMENT | CUTANEOUS | Status: AC
  Administered 2018-02-18: 1
  Filled 2018-02-18: qty 28.35

## 2018-02-18 NOTE — ED Provider Notes (Signed)
  Physical Exam  BP (!) 113/56   Pulse 89   Temp 97.9 F (36.6 C) (Oral)   Resp 15   Ht 5\' 2"  (1.575 m)   Wt 73.9 kg (162 lb 14.7 oz)   LMP  (LMP Unknown)   SpO2 99%   BMI 29.80 kg/m   Physical Exam  ED Course/Procedures     .Paracentesis Date/Time: 02/18/2018 7:07 AM Performed by: Davonna Belling, MD Authorized by: Davonna Belling, MD   Consent:    Consent obtained:  Verbal   Consent given by:  Patient   Risks discussed:  Bleeding, bowel perforation, infection and pain   Alternatives discussed:  No treatment and delayed treatment Pre-procedure details:    Procedure purpose:  Diagnostic   Preparation: Patient was prepped and draped in usual sterile fashion   Anesthesia (see MAR for exact dosages):    Anesthesia method:  Local infiltration   Local anesthetic:  Lidocaine 1% w/o epi Procedure details:    Needle gauge:  20   Ultrasound guidance: yes     Puncture site:  L lower quadrant   Fluid removed amount:  20 cc   Fluid appearance:  Amber   Dressing:  Adhesive bandage Post-procedure details:    Patient tolerance of procedure:  Tolerated well, no immediate complications    MDM         Davonna Belling, MD 02/18/18 [phone]

## 2018-02-18 NOTE — Progress Notes (Signed)
Inpatient Diabetes Program Recommendations  AACE/ADA: New Consensus Statement on Inpatient Glycemic Control (2015)  Target Ranges:  Prepandial:   less than 140 mg/dL      Peak postprandial:   less than 180 mg/dL (1-2 hours)      Critically ill patients:  140 - 180 mg/dL   Lab Results  Component Value Date   GLUCAP 203 (H) 02/18/2018   HGBA1C 7.5 (H) 11/10/2017  Results for NANCEY, KREITZ (MRN [id number]) as of 02/18/2018 12:57  Ref. Range 02/17/2018 15:25 02/17/2018 17:41 02/17/2018 22:58 02/18/2018 07:48 02/18/2018 11:33  Glucose-Capillary Latest Ref Range: 65 - 99 mg/dL 161 (H) 158 (H) 238 (H) 166 (H) 203 (H)   Diabetes history: Type 2 DM Outpatient Diabetes medications: 75/25 60 units bid Current orders for Inpatient glycemic control:  Lantus 40 units daily, Novolog moderate tid with meals  Inpatient Diabetes Program Recommendations:   May consider adding Novolog 4 units tid with meals (hold if patient eats less than 50%).   Thanks,  Adah Perl, RN, BC-ADM Inpatient Diabetes Coordinator Pager [phone] (8a-5p)

## 2018-02-18 NOTE — Progress Notes (Signed)
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 Defer to gastroenterology further discussions with family regarding goals-for now holding Aldactone 25 daily Lasix 40 daily-continue Xifaxan 550 twice daily Will need further adjustment of diuretics soon--slightly vol overloaded at present  Probable upper GI bleed large varices noted 3/11 with oozing of blood 5 bands placed-started on octreotide increase Protonix to 40 IV twice daily anticoagulation was stopped  CAD CABG 2012 DES to distal anastomosis-not a candidate at this time for aspirin or anticoagulation will need to revisit  Atrial fibrillation in setting of repair-previously on amiodarone-currently seems to have converted and is not on any specific rhythm controlling agent-monitor on telemetry  Hypothyroidism continue Synthroid 50 mcg daily  Depression continue Cymbalta 90 at bedtime  Diabetes mellitus type 2 holding 7025 insulin 60 twice daily for now      DVT prophylaxis: None in the setting of GI bleed code Status:   Presumed full   family Communication:   D/w realtive  at bedside Disposition Plan: Inpatient gastroenterology   Consultants:   Gastroenterology  Critical care  Procedures:   None  Antimicrobials:   Ceftriaxone  Subjective:   Awake alert coheret Voices what she had for breakfast Relative states she is clear C/o hand swelling  No cp 2 loose black stool--no blood  Objective: Vitals:   02/18/18 0306 02/18/18 0400 02/18/18 0500 02/18/18 0600  BP:  (!) 117/53 108/63 (!) 113/56  Pulse: 88 89 88 89  Resp: 14 16 14 15   Temp:      TempSrc:      SpO2: 97% 97% 98% 99%  Weight:   73.9 kg (162 lb 14.7 oz)   Height:        Intake/Output Summary (Last 24 hours) at 02/18/2018 0720 Last data filed at 02/18/2018 0600 Gross per 24 hour  Intake 953 ml  Output 5 ml  Net 948 ml   Filed Weights   02/16/18 1335 02/17/18 0500 02/18/18 0500  Weight: 72.2 kg (159 lb 2.8 oz) 72 kg (158 lb 11.7 oz) 73.9 kg (162 lb 14.7 oz)    Examination:   eomi ncat no pallor ++icteric  Poor dentitiosn cta b abd sfot nt nd no rebound no guard--tympanic abd grd 2-3 LE edema  Data Reviewed: I have personally reviewed following labs and imaging studies  CBC: Recent Labs  Lab 02/15/18 1622 02/16/18 0129 02/16/18 1524 02/16/18 1537 02/16/18 2318 02/17/18 0734 02/18/18 0245  WBC 13.5* 16.4*  --  17.0*  --  11.0* 8.9  HGB 9.5* 7.6* 5.1* 5.0* 7.7* 7.3* 7.8*  HCT 29.4* 24.1* 15.0* 15.8* 22.9* 22.7* 23.3*  MCV 89.9 92.3  --  92.4  --  89.0 90.3  PLT 156 143*  --  104*  --  72* 68*   Basic Metabolic Panel: Recent Labs  Lab 02/15/18 1622 02/16/18 0129 02/16/18 1505 02/16/18 1524 02/17/18 0734 02/18/18 0245  NA 131* 133* 130* 133* 132* 132*  K 5.8* 5.5* 4.4 4.7 4.4 3.9  CL 100* 101 98*  --  101 103  CO2 18* 17* 16*  --  22 21*  GLUCOSE 297* 372* 219*  --  250* 229*  BUN 31* 36* 44*  --  44* 32*  CREATININE 1.04* 1.33* 1.29*  --  1.23* 0.99  CALCIUM 8.0* 8.1* 8.7*  --  8.5* 8.3*   GFR: Estimated Creatinine Clearance: 54 mL/min (by  C-G formula based on SCr of 0.99 mg/dL). Liver Function Tests: Recent Labs  Lab 02/15/18 1622 02/16/18 0129 02/16/18 1505 02/17/18 0734 02/18/18 0245  AST 120* 105* 88* 113* 137*  ALT 47 42 34 46 54  ALKPHOS 190* 169* 127* 133* 137*  BILITOT 5.4* 6.1* 5.2* 7.2* 5.0*  PROT 5.3* 5.2* 5.5* 5.7* 5.3*  ALBUMIN 1.9* 2.0* 3.4* 3.2* 2.8*   Recent Labs  Lab 02/15/18 1622  LIPASE 41   Recent Labs  Lab 02/15/18 1622 02/16/18 2318 02/17/18 0734  AMMONIA 112* 58* 37*   Coagulation Profile: Recent Labs  Lab 02/15/18 2202 02/16/18 0129 02/17/18 0734 02/18/18 0245  INR 1.68 1.83 1.91 1.69   Cardiac Enzymes: No results for input(s): CKTOTAL, CKMB, CKMBINDEX, TROPONINI in the last 168 hours. CBG: Recent Labs  Lab 02/17/18 0748 02/17/18 1134 02/17/18 1525 02/17/18 1741 02/17/18 2258  GLUCAP 251* 230* 161* 158* 238*   Urine analysis:    Component Value Date/Time   COLORURINE AMBER (A) 02/15/2018 1953   APPEARANCEUR HAZY (A) 02/15/2018 1953   LABSPEC 1.039 (H) 02/15/2018 1953   PHURINE 5.0 02/15/2018 1953   GLUCOSEU >=500 (A) 02/15/2018 1953   HGBUR NEGATIVE 02/15/2018 1953   BILIRUBINUR NEGATIVE 02/15/2018 1953   KETONESUR 5 (A) 02/15/2018 1953   PROTEINUR NEGATIVE 02/15/2018 1953   UROBILINOGEN 1.0 02/02/2015 1539   NITRITE NEGATIVE 02/15/2018 1953   LEUKOCYTESUR TRACE (A) 02/15/2018 1953     Radiology Studies: Reviewed images personally in health database    Scheduled Meds: . DULoxetine  90 mg Oral Q lunch  . insulin aspart  0-15 Units Subcutaneous TID WC  . insulin glargine  40 Units Subcutaneous Daily  . lactulose  30 g Oral TID  . levothyroxine  50 mcg Oral QAC breakfast  . pantoprazole (PROTONIX) IV  40 mg Intravenous Q12H  . rifaximin  550 mg Oral BID  . sodium chloride flush  3 mL Intravenous Q12H   Continuous Infusions: . sodium chloride    . cefTRIAXone (ROCEPHIN)  IV Stopped (02/18/18 0022)  . octreotide  (SANDOSTATIN)    IV infusion 50  mcg/hr (02/18/18 0600)     LOS: 3 days    Time spent: [place], MD [facility]   If 7PM-7AM, please contact night-coverage [email or web address] Password [email or web address] 02/18/2018, 7:20 AM

## 2018-02-18 NOTE — Progress Notes (Signed)
[facility] Gastroenterology Progress Note  Denise Cuevas 67 y.o. 12-Aug-1951  CC:  Variceal bleeding, encephalopathy, cirrhosis   Subjective: Patient alert and oriented today. Denied any abdominal pain, nausea vomiting. Having 3-4 bowel movements per day. No black stool or bright blood per rectum.  ROS : Not able to obtain   Objective: Vital signs in last 24 hours: Vitals:   02/18/18 0900 02/18/18 1000  BP: 109/60 (!) 110/50  Pulse: 90 90  Resp: 15 20  Temp:    SpO2: 99% 100%    Physical Exam:  Gen. Alert and oriented 3. Not in acute distress. Heart. Regular rhythm regular. Murmur present. Abdomen. Soft, mild distended, nontender, heart sounds present. No peritoneal signs Lower Extremity. 1+ edema.  Lab Results: Recent Labs    02/17/18 0734 02/18/18 0245  NA 132* 132*  K 4.4 3.9  CL 101 103  CO2 22 21*  GLUCOSE 250* 229*  BUN 44* 32*  CREATININE 1.23* 0.99  CALCIUM 8.5* 8.3*   Recent Labs    02/17/18 0734 02/18/18 0245  AST 113* 137*  ALT 46 54  ALKPHOS 133* 137*  BILITOT 7.2* 5.0*  PROT 5.7* 5.3*  ALBUMIN 3.2* 2.8*   Recent Labs    02/17/18 0734 02/18/18 0245  WBC 11.0* 8.9  HGB 7.3* 7.8*  HCT 22.7* 23.3*  MCV 89.0 90.3  PLT 72* 68*   Recent Labs    02/17/18 0734 02/18/18 0245  LABPROT 21.7* 19.7*  INR 1.91 1.69      Assessment/Plan: - Variceal bleeding. Status post EGD  02/16/2018 with 5 bands placement  - Acute blood loss anemia. Hemoglobin was down to 5. Hemoglobin improved after blood transfusion. Current hemoglobin is 7.8. - Hepatic encephalopathy. Currently on Xifaxan and lactulose. - Decompensated cirrhosis probably from [place] . 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 Was on Coumadin which was discontinued by Dr. [name]  - Ascites. Status post diagnostic paracentesis. Fluid analysis negative for SBP.  Recommendations ------------------------- - Patient with no further bleeding episodes. Okay  to transfer to telemetry from GI standpoint. - Advance diet as tolerated starting with a soft diet. Recommend low-salt diet given her ascites and cirrhosis. - Continue octreotide for another 24 hours. Continue IV twice a day PPI and antibiotics. - Continue lactulose and rifaximin. - Patient with advancement score. Long-term poor prognosis discussed with patient and patient's husband today. They're not sure of liver transplant at this time. They are advised to discuss with Dr. Paulita Fujita after discharge - GI will follow.  Otis Brace MD, [place] 02/18/2018, 10:28 AM  Contact #  [phone]

## 2018-02-19 ENCOUNTER — Ambulatory Visit: Payer: Medicare Other | Admitting: Cardiology

## 2018-02-19 ENCOUNTER — Encounter (HOSPITAL_COMMUNITY): Payer: Self-pay | Admitting: General Practice

## 2018-02-19 LAB — CBC
HCT: 25.4 % — ABNORMAL LOW (ref 36.0–46.0)
Hemoglobin: 8.2 g/dL — ABNORMAL LOW (ref 12.0–15.0)
MCH: 29.8 pg (ref 26.0–34.0)
MCHC: 32.3 g/dL (ref 30.0–36.0)
MCV: 92.4 fL (ref 78.0–100.0)
PLATELETS: 46 10*3/uL — AB (ref 150–400)
RBC: 2.75 MIL/uL — AB (ref 3.87–5.11)
RDW: 20.3 % — ABNORMAL HIGH (ref 11.5–15.5)
WBC: 8.5 10*3/uL (ref 4.0–10.5)

## 2018-02-19 LAB — COMPREHENSIVE METABOLIC PANEL
ALBUMIN: 2.6 g/dL — AB (ref 3.5–5.0)
ALT: 61 U/L — ABNORMAL HIGH (ref 14–54)
ANION GAP: 8 (ref 5–15)
AST: 144 U/L — ABNORMAL HIGH (ref 15–41)
Alkaline Phosphatase: 169 U/L — ABNORMAL HIGH (ref 38–126)
BILIRUBIN TOTAL: 5.1 mg/dL — AB (ref 0.3–1.2)
BUN: 19 mg/dL (ref 6–20)
CO2: 20 mmol/L — ABNORMAL LOW (ref 22–32)
Calcium: 7.8 mg/dL — ABNORMAL LOW (ref 8.9–10.3)
Chloride: 104 mmol/L (ref 101–111)
Creatinine, Ser: 0.78 mg/dL (ref 0.44–1.00)
GFR calc Af Amer: >60 mL/min (ref >60)
Glucose, Bld: 186 mg/dL — ABNORMAL HIGH (ref 65–99)
POTASSIUM: 4 mmol/L (ref 3.5–5.1)
Sodium: 132 mmol/L — ABNORMAL LOW (ref 135–145)
TOTAL PROTEIN: 5.3 g/dL — AB (ref 6.5–8.1)

## 2018-02-19 LAB — PROTIME-INR
INR: 1.46
PROTHROMBIN TIME: 17.6 s — AB (ref 11.4–15.2)

## 2018-02-19 LAB — GLUCOSE, CAPILLARY
GLUCOSE-CAPILLARY: 180 mg/dL — AB (ref 65–99)
GLUCOSE-CAPILLARY: 350 mg/dL — AB (ref 65–99)
Glucose-Capillary: 224 mg/dL — ABNORMAL HIGH (ref 65–99)
Glucose-Capillary: 235 mg/dL — ABNORMAL HIGH (ref 65–99)

## 2018-02-19 MED ORDER — SPIRONOLACTONE 25 MG PO TABS
25.0000 mg | ORAL_TABLET | Freq: Every day | ORAL | Status: DC
  Administered 2018-02-19 – 2018-02-20 (×2): 25 mg via ORAL
  Filled 2018-02-19 (×2): qty 1

## 2018-02-19 MED ORDER — PANTOPRAZOLE SODIUM 40 MG PO TBEC
40.0000 mg | DELAYED_RELEASE_TABLET | Freq: Every day | ORAL | Status: DC
  Administered 2018-02-19 – 2018-02-20 (×2): 40 mg via ORAL
  Filled 2018-02-19 (×2): qty 1

## 2018-02-19 MED ORDER — FUROSEMIDE 20 MG PO TABS
20.0000 mg | ORAL_TABLET | Freq: Every day | ORAL | Status: DC
  Administered 2018-02-19 – 2018-02-20 (×2): 20 mg via ORAL
  Filled 2018-02-19 (×2): qty 1

## 2018-02-19 NOTE — Progress Notes (Signed)
[facility] Gastroenterology Progress Note  Denise Cuevas 67 y.o. 23-Oct-1951  CC:  Variceal bleeding, encephalopathy, cirrhosis   Subjective: Patient continues to do better. Denied any further bleeding episodes. Alert and oriented 3 today.  Denied any abdominal pain, nausea vomiting.   ROS : Negative for chest pain. Mild difficulty breathing which has improved.   Objective: Vital signs in last 24 hours: Vitals:   02/19/18 0816 02/19/18 1134  BP: (!) 116/51 (!) 116/55  Pulse: 91 88  Resp:    Temp:    SpO2: 99% 100%    Physical Exam:  Gen. Alert and oriented 3. Not in acute distress. Heart. Regular rhythm regular. Murmur present. Abdomen. Soft, mild distended, nontender, heart sounds present. No peritoneal signs Lower Extremity. 1+ edema up to mid thigh  Lab Results: Recent Labs    02/18/18 0245 02/19/18 0630  NA 132* 132*  K 3.9 4.0  CL 103 104  CO2 21* 20*  GLUCOSE 229* 186*  BUN 32* 19  CREATININE 0.99 0.78  CALCIUM 8.3* 7.8*   Recent Labs    02/18/18 0245 02/19/18 0630  AST 137* 144*  ALT 54 61*  ALKPHOS 137* 169*  BILITOT 5.0* 5.1*  PROT 5.3* 5.3*  ALBUMIN 2.8* 2.6*   Recent Labs    02/18/18 0245 02/19/18 0630  WBC 8.9 8.5  HGB 7.8* 8.2*  HCT 23.3* 25.4*  MCV 90.3 92.4  PLT 68* 46*   Recent Labs    02/18/18 0245 02/19/18 0630  LABPROT 19.7* 17.6*  INR 1.69 1.46      Assessment/Plan: - Variceal bleeding. Status post EGD  02/16/2018 with 5 bands placement  - Acute blood loss anemia. Hemoglobin was down to 5. Hemoglobin improved after blood transfusion. Current hemoglobin is 8.2 - Hepatic encephalopathy. Resolved Currently on Xifaxan and lactulose. - Decompensated cirrhosis probably from [place] . 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 Was on Coumadin which was discontinued by Dr. [name]  - Ascites. Status post diagnostic paracentesis. Fluid analysis negative for  SBP.  Recommendations ------------------------- - DC octreotide and IV Protonix. - PO once a day Protonix  - Restart low-dose diuretic. Lasix 20 mg and Aldactone 25 mg. - Consider low-dose beta blocker as an outpatient - Okay to discharge from GI standpoint tomorrow if continues to do better.  - Follow-up with Dr. Paulita Fujita in 2 weeks after discharge . Recommend repeat EGD for further band ligation in 4 weeks  - GI will sign off. Call us back if needed  Otis Brace MD, [place] 02/19/2018, 11:37 AM  Contact #  [phone]

## 2018-02-19 NOTE — Plan of Care (Signed)
  Progressing Elimination: Will not experience complications related to bowel motility 02/19/2018 2140 - Progressing by Barton Dubois, RN Safety: Ability to remain free from injury will improve 02/19/2018 2140 - Progressing by Barton Dubois, RN

## 2018-02-19 NOTE — Plan of Care (Signed)
Nutrition Education Note  RD consulted for nutrition education regarding cirrhosis.   Spoke with pt and sister at bedside. Pt reports decreased oral intake over the past 2 weeks, related to nausea and early satiety. Since admission, intake has greatly improved. Pt reports that she is tired of being hospitalized and wants to do whatever she can to stay out of the hospital.   Pt consumes 3 meals per day (Breakfast: toast and egg or yogurt, Lunch: sandwich, Dinner: salmon and salad). She does not add salt to her food. Discussed hidden sodium sources found in food. Also encouraged small, frequent high protein meals.  RD provided "Cirrhosis Nutrition Therapy" handout from the Academy of [employer]. Reviewed patient's dietary recall. Provided examples on ways to decrease sodium intake in diet. Discouraged intake of processed foods and use of salt shaker. Encouraged fresh fruits and vegetables as well as whole grain sources of carbohydrates to maximize fiber intake.   RD discussed why it is important for patient to adhere to diet recommendations, and emphasized the role of fluids, foods to avoid, and importance of monitoring weight. Also discussed consuming small, frequent meals to promote adequate intake given early satiety.Teach back method used.  Expect fair to good compliance.  Body mass index is 31.75 kg/m. Pt meets criteria for obesity, class I based on current BMI.  Current diet order is soft, 2 gram sodium, patient is consuming approximately 100% of meals at this time. Labs and medications reviewed. No further nutrition interventions warranted at this time. RD contact information provided. If additional nutrition issues arise, please re-consult RD.   Tonia Avino A. Jimmye Norman, RD, LDN, CDE Pager: [phone] After hours Pager: [phone]

## 2018-02-19 NOTE — Plan of Care (Signed)
  Safety: Ability to remain free from injury will improve 02/19/2018 0029 - Progressing by Barton Dubois, RN Pt. Stated she's feeling better and getting stronger. Bed alarm on for safety. Pt. Told to call for assistance when needed.

## 2018-02-19 NOTE — Progress Notes (Signed)
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 Defer to gastroenterology further discussions with family regarding goals- diuretics resumed as per GI 3/14Aldactone 25 daily Lasix 40 daily-continue Xifaxan 550 twice daily  Probable upper GI bleed large varices noted 3/11 with oozing of blood 5 bands placed-started on octreotide increase Protonix to 40 IV twice daily anticoagulation was stopped  CAD CABG 2012 DES to distal anastomosis-not a candidate at this time for aspirin or anticoagulation will need to revisit  Atrial fibrillation in setting of repair-previously on amiodarone-currently seems to have converted and is not on any specific rhythm controlling agent-monitor on telemetry  Hypothyroidism continue Synthroid 50 mcg daily  Depression continue Cymbalta 90 at bedtime  Diabetes mellitus type 2 holding 7025 insulin 60 twice daily for now      DVT prophylaxis: None in the setting of GI bleed code Status:   Presumed full   family Communication:   D/w realtive at bedside They have poor overall understandning of disease  state.  WIll need re-infrocement of the same by GI Dr. Paulita Fujita as OP Disposition Plan: Inpatient gastroenterology--likely home tomorrw   Consultants:   Gastroenterology  Critical care  Procedures:   None  Antimicrobials:   Ceftriaxone  Subjective:   More awake alert appropriate q's no cp some swelling No fever mild sob no chills no sputum  Objective: Vitals:   02/19/18 0120 02/19/18 0549 02/19/18 0816 02/19/18 1134  BP: (!) 118/53 (!) 113/55 (!) 116/51 (!) 116/55  Pulse: 93 89 91 88  Resp: 18 18    Temp: 98.3 F (36.8 C) 98.4 F (36.9 C)    TempSrc: Oral Oral    SpO2: 97% 97% 99% 100%  Weight:  78.7 kg (173 lb 9.6 oz)    Height:        Intake/Output Summary (Last 24 hours) at 02/19/2018 1309 Last data filed at 02/19/2018 0929 Gross per 24 hour  Intake 760 ml  Output 500 ml  Net 260 ml   Filed Weights   02/18/18 0500 02/18/18 2145 02/19/18 0549  Weight: 73.9 kg (162 lb 14.7 oz) 78.3 kg (172 lb 9.6 oz) 78.7 kg (173 lb 9.6 oz)    Examination:   eomi ncat no pallor ++icteric  Poor dentition cta b abd soft nt nd no rebound no guard--tympanic abd grd 2-3 LE edema  Data Reviewed: I have personally reviewed following labs and imaging studies  CBC: Recent Labs  Lab 02/16/18 0129  02/16/18 1537 02/16/18 2318 02/17/18 0734 02/18/18 0245 02/19/18 0630  WBC 16.4*  --  17.0*  --  11.0* 8.9 8.5  HGB 7.6*   < > 5.0* 7.7* 7.3* 7.8* 8.2*  HCT 24.1*   < > 15.8* 22.9* 22.7* 23.3*  25.4*  MCV 92.3  --  92.4  --  89.0 90.3 92.4  PLT 143*  --  104*  --  72* 68* 46*   < > = values in this interval not displayed.   Basic Metabolic Panel: Recent Labs  Lab 02/16/18 0129 02/16/18 1505 02/16/18 1524 02/17/18 0734 02/18/18 0245 02/19/18 0630  NA 133* 130* 133* 132* 132* 132*  K 5.5* 4.4 4.7 4.4 3.9 4.0  CL 101 98*  --  101 103 104  CO2 17* 16*  --  22 21* 20*  GLUCOSE 372* 219*  --  250* 229* 186*  BUN 36* 44*  --  44* 32* 19  CREATININE 1.33* 1.29*  --   1.23* 0.99 0.78  CALCIUM 8.1* 8.7*  --  8.5* 8.3* 7.8*   GFR: Estimated Creatinine Clearance: 69 mL/min (by C-G formula based on SCr of 0.78 mg/dL). Liver Function Tests: Recent Labs  Lab 02/16/18 0129 02/16/18 1505 02/17/18 0734 02/18/18 0245 02/19/18 0630  AST 105* 88* 113* 137* 144*  ALT 42 34 46 54 61*  ALKPHOS 169* 127* 133* 137* 169*  BILITOT 6.1* 5.2* 7.2* 5.0* 5.1*  PROT 5.2* 5.5* 5.7* 5.3* 5.3*  ALBUMIN 2.0* 3.4* 3.2* 2.8* 2.6*   Recent Labs  Lab 02/15/18 1622  LIPASE 41   Recent Labs  Lab 02/15/18 1622 02/16/18 2318 02/17/18 0734  AMMONIA 112* 58* 37*   Coagulation Profile: Recent Labs  Lab 02/15/18 2202 02/16/18 0129 02/17/18 0734 02/18/18 0245 02/19/18 0630  INR 1.68 1.83 1.91 1.69 1.46   Cardiac Enzymes: No results for input(s): CKTOTAL, CKMB, CKMBINDEX, TROPONINI in the last 168 hours. CBG: Recent Labs  Lab 02/18/18 1133 02/18/18 1540 02/18/18 2202 02/19/18 0736 02/19/18 1131  GLUCAP 203* 291* 177* 180* 350*   Urine analysis:    Component Value Date/Time   COLORURINE AMBER (A) 02/15/2018 1953   APPEARANCEUR HAZY (A) 02/15/2018 1953   LABSPEC 1.039 (H) 02/15/2018 1953   PHURINE 5.0 02/15/2018 1953   GLUCOSEU >=500 (A) 02/15/2018 1953   HGBUR NEGATIVE 02/15/2018 1953   BILIRUBINUR NEGATIVE 02/15/2018 1953   KETONESUR 5 (A) 02/15/2018 1953   PROTEINUR NEGATIVE 02/15/2018 1953   UROBILINOGEN 1.0 02/02/2015 1539   NITRITE NEGATIVE 02/15/2018 1953   LEUKOCYTESUR TRACE (A) 02/15/2018 1953     Radiology Studies: Reviewed images personally in health database    Scheduled Meds: . DULoxetine  90 mg Oral Q lunch  . furosemide  20 mg Oral Daily  . insulin aspart  0-15 Units Subcutaneous TID WC  . insulin glargine  40 Units Subcutaneous Daily  . lactulose  30 g Oral TID  . levothyroxine  50 mcg Oral QAC breakfast  . pantoprazole  40 mg Oral Daily  . rifaximin  550 mg Oral BID  . sodium chloride flush  3 mL Intravenous Q12H  .  spironolactone  25 mg Oral Daily   Continuous Infusions: . sodium chloride    . cefTRIAXone (ROCEPHIN)  IV Stopped (02/19/18 0224)     LOS: 4 days    Time spent: [place], MD [facility]   If 7PM-7AM, please contact night-coverage [email or web address] Password [email or web address] 02/19/2018, 1:09 PM

## 2018-02-20 LAB — GLUCOSE, CAPILLARY: Glucose-Capillary: 165 mg/dL — ABNORMAL HIGH (ref 65–99)

## 2018-02-20 MED ORDER — HUMALOG MIX 75/25 KWIKPEN (75-25) 100 UNIT/ML ~~LOC~~ SUPN: 25.0000 [IU] | PEN_INJECTOR | Freq: Two times a day (BID) | SUBCUTANEOUS | 10 refills | Status: DC

## 2018-02-20 MED ORDER — PANTOPRAZOLE SODIUM 40 MG PO TBEC: 40.0000 mg | DELAYED_RELEASE_TABLET | Freq: Every day | ORAL | 0 refills | Status: DC

## 2018-02-20 NOTE — Discharge Summary (Signed)
Physician Discharge Summary  EDY MCBANE [id number] DOB: 06/01/1951 DOA: 02/15/2018  PCP: [name], MD  Admit date: 02/15/2018 Discharge date: 02/20/2018  Time spent: 25  minutes  Recommendations for Outpatient Follow-up:  1. no aspirin ongoing as a risk for bleeding outweighs antiplatelet anticoagulation 2. Needs outpatient close follow-up with Dr. Paulita Fujita has been scheduled for 5 weeks out was cc gastroenterology for closer appointment 3. Needs complete metabolic panel INR and CBC in about 1 week at PCP office 4. 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, prior adenomatous polyp 9/15 HTN,HLD,DM 2 x 2, hypothyroidism Recently admitted 12/2017 with Encephalopathy initially admitted 02/16/2018 with no nausea vomiting prior to admission with confusion On admission leukocytosis 13, 112 bili 5.4 AST 120 ALT 47 Because of intermittent black stools and hemoglobin of 7.6 she was seen by gastroenterology and critical care consult and spent the night on the ICU but resolved    Hospital Course:  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 fib control  Probable  upper GI bleed large varices noted 3/11 with oozing of blood 5 bands placed-started on octreotide increase Protonix to 40 IV twice daily anticoagulation was stopped-no aspirin ongoing needs outpatient goals of care-will also stop any sedating medications consider lower doses of other meds  CAD CABG 2012 DES to distal anastomosis-not a candidate at this time for aspirin or anticoagulation will need to revisit as an outpatient  Atrial fibrillation in setting of repair-previously on amiodarone-currently seems to have converted and is not on any specific rhythm controlling agent-monitor on telemetry  Hypothyroidism continue Synthroid 50 mcg daily  Depression continue Cymbalta 90 at bedtime  Diabetes mellitus type 2 holding 7025 insulin 60 twice daily for now-on discharge was not really eating much so discussed with patient to cut back to lower doses of 7030 insulin   Procedures: EGD and banding of varices  Consultations:  GI  ICU  Discharge Exam: Vitals:   02/19/18 2020 02/20/18 0417  BP: (!) 115/46 (!) 112/52  Pulse: 94 93  Resp: 18 18  Temp: 98.4 F (36.9 C) 98.4 F (36.9 C)  SpO2: 100% 100%     General: awake alert pleasant no distress mildly icteric Cardiovascular: S1-S2 no murmur rub or gallop seems to be in sinus rhythm Respiratory: Clinically clear no added sound Abdomen obese nontender nondistended no rebound no guarding Grade 3 lower extremity deep  Discharge Instructions   Discharge Instructions    Diet - low sodium heart healthy   Complete by:  As directed    Discharge instructions   Complete by:  As directed    No aspirin for now Follow with Dr. Paulita Fujita Labs 1-2 weeks at primary MD office Look at meds carefully for changes--they  will be clearly marked for you   Increase activity slowly   Complete by:  As directed      Allergies as of 02/20/2018      Reactions   Exenatide Nausea And Vomiting   Ace Inhibitors Other (See Comments)   pseudoasthma       Medication List    STOP taking these medications   aspirin 81 MG tablet     TAKE these medications   albuterol (2.5 MG/3ML) 0.083% nebulizer solution Commonly known as:  PROVENTIL Take 3 mLs (2.5 mg total) by nebulization every 2 (two) hours as needed for wheezing.   albuterol 108 (90 Base) MCG/ACT inhaler Commonly known as:  PROVENTIL HFA;VENTOLIN HFA Inhale 2 puffs into the lungs every 6 (six) hours as needed for wheezing or shortness of breath.   DULoxetine 30 MG capsule Commonly known as:  CYMBALTA Take 30 mg by mouth daily with lunch. In conjunction with one 60 mg capsule to equal a total of 90 milligrams   DULoxetine 60 MG capsule Commonly known as:  CYMBALTA Take 60 mg by mouth daily with lunch. In conjunction with one 30 mg capsule to equal a total of 90 milligrams   furosemide 40 MG tablet Commonly known as:  LASIX Take 40 mg by mouth daily.   HUMALOG MIX 75/25 KWIKPEN (75-25) 100 UNIT/ML Kwikpen Generic drug:  Insulin Lispro Prot & Lispro Inject 25 Units into the skin 2 (two) times daily. What changed:  See the new instructions.   JARDIANCE 25 MG Tabs tablet Generic drug:  empagliflozin Take 25 mg daily by mouth.   KRISTALOSE 20 g packet Generic drug:  lactulose Take 20 g by mouth 3 (three) times daily.   levothyroxine 50 MCG tablet Commonly known as:  SYNTHROID, LEVOTHROID Take 1 tablet (50 mcg total) by mouth daily before breakfast.   mometasone-formoterol 100-5 MCG/ACT Aero Commonly known as:  DULERA Inhale 2 puffs into the lungs 2 (two) times daily as needed for wheezing or shortness of breath.   omeprazole-sodium bicarbonate 40-1100 MG capsule Commonly known as:  ZEGERID Take 1 capsule by mouth daily before breakfast.   pantoprazole 40 MG tablet Commonly known as:  PROTONIX Take 1 tablet (40 mg total) by mouth daily. Start taking on:  02/21/2018   spironolactone 25 MG tablet Commonly known as:  ALDACTONE Take 1 tablet (25 mg total) by  mouth daily.   traMADol 50 MG tablet Commonly known as:  ULTRAM Take 1 tablet (50 mg total) by mouth every 6 (six) hours as needed for moderate pain.   XIFAXAN 550 MG Tabs tablet Generic drug:  rifaximin Take 550 mg by mouth 2 (two) times daily.      Allergies  Allergen Reactions  . Exenatide Nausea And Vomiting  . Ace Inhibitors Other (See Comments)    pseudoasthma   Follow-up Information    Arta Silence, MD. Schedule an appointment as soon as possible for a visit in 2 week(s).   Specialty:  Gastroenterology Contact information: [address] [place] [address] [phone]            The results of significant diagnostics from this hospitalization (including imaging, microbiology, ancillary and laboratory) are listed below for reference.    Significant Diagnostic Studies: Dg Chest 2 View  Result Date: 02/15/2018 CLINICAL DATA:  Shortness of breath. EXAM: CHEST - 2 VIEW COMPARISON:  Chest x-ray dated January 01, 2018. FINDINGS: The heart size and mediastinal contours are within normal limits. Prior  mitral and aortic valve replacements. Normal pulmonary vascularity. Low lung volumes. No focal consolidation, pleural effusion, or pneumothorax. No acute osseous abnormality. Unchanged elevation of the right hemidiaphragm. IMPRESSION: No active cardiopulmonary disease. Electronically Signed   By: Titus Dubin M.D.   On: 02/15/2018 17:05   Ct Abdomen Pelvis W Contrast  Result Date: 02/15/2018 CLINICAL DATA:  The pt is c/o abd pain with nausea and vomiting since Thursday Yellow tent to her skin Cirrhosis of the liver non-alcoholic . No BM in 3 days. Patient has multiple pain complaints due to falling twice down steps x 2 weeks. Primarily right middle back EXAM: CT ABDOMEN AND PELVIS WITH CONTRAST TECHNIQUE: Multidetector CT imaging of the abdomen and pelvis was performed using the standard protocol following bolus administration of intravenous contrast.  CONTRAST:  100 mL of Isovue-300 intravenous contrast COMPARISON:  None. FINDINGS: Lower chest: Small right and moderate left pleural effusions. There is dependent opacity in the left lower lobe consistent with atelectasis. Mild atelectasis is seen at the base of the right middle lobe. No evidence of pneumonia or pulmonary edema. Heart is normal in size. There changes from cardiac surgery and aortic valve replacement. Hepatobiliary: Morphologic changes of the liver consistent with cirrhosis. No liver mass or focal lesion. Gallbladder surgically absent. No bile duct dilation. Pancreas: Unremarkable. No pancreatic ductal dilatation or surrounding inflammatory changes. Spleen: Spleen top-normal in size measuring 12 x 5 x 12 cm. No splenic mass or focal lesion. Adrenals/Urinary Tract: Adrenal glands are unremarkable. Kidneys are normal, without renal calculi, focal lesion, or hydronephrosis. Bladder is unremarkable. Stomach/Bowel: Small hiatal hernia. Evidence of periesophageal varices. Stomach otherwise unremarkable. Small bowel is normal in caliber. No wall thickening. And no dilation of the colon. No colonic wall thickening. Appendix not visualized. Vascular/Lymphatic: Aortic atherosclerosis.  No aneurysm. There are multiple splenic collaterals. The portal vein is small, but patent. Normal caliber patent splenic vein and superior mesenteric vein. No adenopathy. Reproductive: Uterus and bilateral adnexa are unremarkable. Other: Moderate amount of ascites.  No abdominal wall hernia. Musculoskeletal: No fracture or acute finding. No osteoblastic or osteolytic lesions. IMPRESSION: 1. No acute findings within the abdomen or pelvis. 2. Cirrhosis with portal venous hypertension. No liver masses. Portal venous hypertension is reflected by moderate ascites and upper abdominal venous collaterals. The portal vein is small but patent. 3. No bowel obstruction or inflammation. 4. Aortic atherosclerosis. 5. Moderate left and small  right pleural effusions associated with dependent atelectasis. 6. No fracture or acute skeletal abnormality. Electronically Signed   By: Lajean Manes M.D.   On: 02/15/2018 19:22    Microbiology: Recent Results (from the past 240 hour(s))  Culture, body fluid-bottle     Status: None (Preliminary result)   Collection Time: 02/15/18 11:15 PM  Result Value Ref Range Status   Specimen Description PERITONEAL CAVITY  Final   Special Requests   Final    BOTTLES DRAWN AEROBIC ONLY Blood Culture adequate volume   Culture   Final    NO GROWTH 3 DAYS Performed at [facility], [address], [place], [place] [address]    Report Status PENDING  Incomplete  Gram stain     Status: None   Collection Time: 02/15/18 11:15 PM  Result Value Ref Range Status   Specimen Description PERITONEAL CAVITY  Final   Special Requests NONE  Final   Gram Stain   Final    MODERATE WBC PRESENT,BOTH PMN AND MONONUCLEAR NO ORGANISMS SEEN Performed at [facility]  Lab, [address] [place], [place] [address]    Report Status 02/16/2018 FINAL  Final  MRSA PCR Screening     Status: None   Collection Time: 02/16/18  6:53 PM  Result Value Ref Range Status   MRSA by PCR NEGATIVE NEGATIVE Final    Comment:        The GeneXpert MRSA Assay (FDA approved for NASAL specimens only), is one component of a comprehensive MRSA colonization surveillance program. It is not intended to diagnose MRSA infection nor to guide or monitor treatment for MRSA infections. Performed at [facility], [place] [address] [place], [place] [address]      Labs: Basic Metabolic Panel: Recent Labs  Lab 02/16/18 0129 02/16/18 1505 02/16/18 1524 02/17/18 0734 02/18/18 0245 02/19/18 0630  NA 133* 130* 133* 132* 132* 132*  K 5.5* 4.4 4.7 4.4 3.9 4.0  CL 101 98*  --  101 103 104  CO2 17* 16*  --  22 21* 20*  GLUCOSE 372* 219*  --  250* 229* 186*  BUN 36* 44*  --  44* 32* 19  CREATININE 1.33* 1.29*  --  1.23* 0.99 0.78   CALCIUM 8.1* 8.7*  --  8.5* 8.3* 7.8*   Liver Function Tests: Recent Labs  Lab 02/16/18 0129 02/16/18 1505 02/17/18 0734 02/18/18 0245 02/19/18 0630  AST 105* 88* 113* 137* 144*  ALT 42 34 46 54 61*  ALKPHOS 169* 127* 133* 137* 169*  BILITOT 6.1* 5.2* 7.2* 5.0* 5.1*  PROT 5.2* 5.5* 5.7* 5.3* 5.3*  ALBUMIN 2.0* 3.4* 3.2* 2.8* 2.6*   Recent Labs  Lab 02/15/18 1622  LIPASE 41   Recent Labs  Lab 02/15/18 1622 02/16/18 2318 02/17/18 0734  AMMONIA 112* 58* 37*   CBC: Recent Labs  Lab 02/16/18 0129  02/16/18 1537 02/16/18 2318 02/17/18 0734 02/18/18 0245 02/19/18 0630  WBC 16.4*  --  17.0*  --  11.0* 8.9 8.5  HGB 7.6*   < > 5.0* 7.7* 7.3* 7.8* 8.2*  HCT 24.1*   < > 15.8* 22.9* 22.7* 23.3* 25.4*  MCV 92.3  --  92.4  --  89.0 90.3 92.4  PLT 143*  --  104*  --  72* 68* 46*   < > = values in this interval not displayed.   Cardiac Enzymes: No results for input(s): CKTOTAL, CKMB, CKMBINDEX, TROPONINI in the last 168 hours. BNP: BNP (last 3 results) Recent Labs    10/15/17 1304 10/15/17 1843  BNP 125.3* 146.1*    ProBNP (last 3 results) No results for input(s): PROBNP in the last 8760 hours.  CBG: Recent Labs  Lab 02/19/18 0736 02/19/18 1131 02/19/18 1646 02/19/18 2041 02/20/18 0740  GLUCAP 180* 350* 224* 235* 165*       Signed:  Nita Sells MD   [facility] 02/20/2018, 10:17 AM

## 2018-02-20 NOTE — Evaluation (Addendum)
Physical Therapy Evaluation & Discharge Patient Details Name: Denise Cuevas MRN: [id number] DOB: October 31, 1951 Today's Date: 02/20/2018   History of Present Illness  Pt is a 67 y.o. female with PMH of cirrhosis (likely related to nonalcoholic steatohepatitis), HF, CAD (s/p CABG), s/p AVR (11/2017), and DM, admitted 02/15/18 with confusion, nausea/vomiting. Worked up for hepatic encephalopathy and possible sepsis secondary to spontaneous bacterial peritonitis. S/p EGD on 3/11 with 5 bands palcement. Of note, admitted 12/2017 with hepatic encephalopathy.    Clinical Impression  Patient evaluated by Physical Therapy with no further acute PT needs identified. PTA, pt mod indep with short distances using RW, only requires assist from husband for transfers into tub to ensure safety. Will have 24/7 support from family at d/c. All education has been completed and the patient has no further questions. Recommend HHPT services to maximize functional mobility and independence, but pt politely declining. PT is signing off. Thank you for this referral.   Follow Up Recommendations Home health PT;Supervision/Assistance - 24 hour(pt declining HHPT)    Equipment Recommendations  None recommended by PT    Recommendations for Other Services       Precautions / Restrictions Precautions Precautions: Fall Restrictions Weight Bearing Restrictions: No      Mobility  Bed Mobility Overal bed mobility: Independent                Transfers Overall transfer level: Modified independent Equipment used: Rolling walker (2 wheeled)                Ambulation/Gait Ambulation/Gait assistance: Supervision Ambulation Distance (Feet): 220 Feet Assistive device: Rolling walker (2 wheeled) Gait Pattern/deviations: Step-through pattern;Decreased stride length Gait velocity: Decreased   General Gait Details: Slow, controlled amb with RW and supervision for safety; further distance limited by  fatigue  Stairs            Wheelchair Mobility    Modified Rankin (Stroke Patients Only)       Balance Overall balance assessment: Needs assistance   Sitting balance-Leahy Scale: Good Sitting balance - Comments: Indep for pericare while sitting     Standing balance-Leahy Scale: Fair Standing balance comment: Can static stand with no UE support                             Pertinent Vitals/Pain Pain Assessment: Faces Faces Pain Scale: Hurts a little bit Pain Location: Back Pain Descriptors / Indicators: Constant;Sore Pain Intervention(s): Monitored during session    Home Living Family/patient expects to be discharged to:: Private residence Living Arrangements: Spouse/significant other;Children Available Help at Discharge: Family;Available 24 hours/day Type of Home: House Home Access: Stairs to enter Entrance Stairs-Rails: Right Entrance Stairs-Number of Steps: 8 Home Layout: Multi-level;Able to live on main level with bedroom/bathroom Home Equipment: Gilford Rile - 2 wheels;Bedside commode      Prior Function Level of Independence: Independent with assistive device(s);Needs assistance   Gait / Transfers Assistance Needed: Mod indep with RW for household distances. Husband assists with transfer into shower. Husband main person to run errands and perform household tasks  ADL's / Homemaking Assistance Needed: Indep with ADLs        Hand Dominance        Extremity/Trunk Assessment   Upper Extremity Assessment Upper Extremity Assessment: Overall WFL for tasks assessed    Lower Extremity Assessment Lower Extremity Assessment: Generalized weakness       Communication   Communication: No difficulties  Cognition Arousal/Alertness: Awake/alert  Behavior During Therapy: WFL for tasks assessed/performed Overall Cognitive Status: Within Functional Limits for tasks assessed                                 General Comments: Intermittently  slow to answer questions      General Comments General comments (skin integrity, edema, etc.): Appears jaundiced    Exercises     Assessment/Plan    PT Assessment All further PT needs can be met in the next venue of care  PT Problem List Decreased strength;Decreased activity tolerance;Decreased balance;Decreased mobility;Decreased knowledge of use of DME       PT Treatment Interventions      PT Goals (Current goals can be found in the Care Plan section)  Acute Rehab PT Goals PT Goal Formulation: All assessment and education complete, DC therapy    Frequency     Barriers to discharge        Co-evaluation               AM-PAC PT "6 Clicks" Daily Activity  Outcome Measure Difficulty turning over in bed (including adjusting bedclothes, sheets and blankets)?: None Difficulty moving from lying on back to sitting on the side of the bed? : None Difficulty sitting down on and standing up from a chair with arms (e.g., wheelchair, bedside commode, etc,.)?: A Little Help needed moving to and from a bed to chair (including a wheelchair)?: A Little Help needed walking in hospital room?: A Little Help needed climbing 3-5 steps with a railing? : A Little 6 Click Score: 20    End of Session Equipment Utilized During Treatment: Gait belt Activity Tolerance: Patient tolerated treatment well Patient left: in chair;with call bell/phone within reach;with family/visitor present Nurse Communication: Mobility status PT Visit Diagnosis: Other abnormalities of gait and mobility (R26.89);Muscle weakness (generalized) (M62.81)    Time: 3220-2542 PT Time Calculation (min) (ACUTE ONLY): 20 min   Charges:   PT Evaluation $PT Eval Moderate Complexity: 1 Mod     PT G Codes:       Mabeline Caras, PT, DPT Acute Rehab Services  Pager: [place] 02/20/2018, 8:24 AM

## 2018-02-20 NOTE — Progress Notes (Signed)
Results for JEHIELI, BRASSELL (MRN [id number]) as of 02/20/2018 08:52  Ref. Range 02/19/2018 07:36 02/19/2018 11:31 02/19/2018 16:46 02/19/2018 20:41 02/20/2018 07:40  Glucose-Capillary Latest Ref Range: 65 - 99 mg/dL 180 (H) 350 (H) 224 (H) 235 (H) 165 (H)  Noted that postprandial blood sugars continue to be greater than 180 mg/dl. Recommend adding Novolog 4 units TID with meals if patient eats at least 50% of meal.  Harvel Ricks RN BSN CDE Diabetes Coordinator Pager: [phone]  8am-5pm

## 2018-02-20 NOTE — Care Management Note (Signed)
Case Management Note  Patient Details  Name: MONIQUA ENGEBRETSEN MRN: [id number] Date of Birth: 1951/12/09  Subjective/Objective: Hepatic Encephalopathy                  Action/Plan: Patient lives at home with spouse; PCP: [name], MD; has private insurance with [facility] with prescription drug coverage; pharmacy of choice is [facility]; DME - walker at home; patient is refusing home health care at this time, she stated that Dr Servando Snare is arranging outpatient cardiac rehab for her.  Expected Discharge Date:  02/20/18               Expected Discharge Plan:  [place]  Discharge planning Services  CM Consult  Choice offered to:  Patient  HH Arranged:  Patient Refused HH  Status of Service:  In process, will continue to follow  Sherrilyn Rist [phone] 02/20/2018, 10:37 AM

## 2018-02-20 NOTE — Care Management Important Message (Signed)
Important Message  Patient Details  Name: Denise Cuevas MRN: [id number] Date of Birth: 1951/01/08   Medicare Important Message Given:  Yes    Mileidy Atkin P Leaman Abe 02/20/2018, 3:30 PM

## 2018-02-20 NOTE — Progress Notes (Signed)
Pt got discharged to home, discharge instructions provided and patient showed understanding to it, IV taken out,Telemonitor DC,pt left unit in wheelchair with all of the belongings accompanied with a family member (Husband) 

## 2018-02-21 LAB — CULTURE, BODY FLUID-BOTTLE

## 2018-02-21 LAB — CULTURE, BODY FLUID W GRAM STAIN -BOTTLE
Culture: NO GROWTH
Special Requests: ADEQUATE

## 2018-02-23 ENCOUNTER — Telehealth (HOSPITAL_COMMUNITY): Payer: Self-pay | Admitting: *Deleted

## 2018-02-23 NOTE — Telephone Encounter (Signed)
Pt contacted for assessment of readiness to participate in CR. Spoke first with her husband Yvone Neu, who felt she was not ready for CR. Pt discharged from the hospital on Friday.  Advise pt of our scheduling process.  Presently we are scheduling Orientation middle of May. Pt declined home health and home PT.  Asked pt what her mobility.  Pt stated that she can barely walk from the table from the bed and needs assistance to get to the bathroom. Pt understands from previous PT what exercises she should do but has been unable to.  Asked pt if she felt she would benefit from home PT in the interim until she could get in with cardiac rehab.  Pt declined and didn't think they could offer anymore than she had already received.  Pt has upcoming GI appt this week either Wednesday or Friday for elevated ammonia levels.  Pt is not quite ready for scheduling, will check back the end of this week to see how the follow up appt went.  Pt agreeable with this plan. Cherre Huger, BSN Cardiac and Training and development officer

## 2018-03-06 ENCOUNTER — Other Ambulatory Visit: Payer: Self-pay | Admitting: Cardiology

## 2018-04-02 ENCOUNTER — Ambulatory Visit: Payer: Medicare Other | Admitting: Cardiothoracic Surgery

## 2018-04-02 ENCOUNTER — Encounter: Payer: Self-pay | Admitting: Cardiothoracic Surgery

## 2018-04-02 ENCOUNTER — Other Ambulatory Visit: Payer: Self-pay

## 2018-04-02 VITALS — BP 133/71 | HR 66 | Resp 16 | Ht 62.0 in | Wt 141.0 lb

## 2018-04-02 DIAGNOSIS — Z952 Presence of prosthetic heart valve: Secondary | ICD-10-CM | POA: Diagnosis not present

## 2018-04-02 DIAGNOSIS — K7682 Hepatic encephalopathy: Secondary | ICD-10-CM

## 2018-04-02 DIAGNOSIS — K7460 Unspecified cirrhosis of liver: Secondary | ICD-10-CM

## 2018-04-02 DIAGNOSIS — K7290 Hepatic failure, unspecified without coma: Secondary | ICD-10-CM | POA: Diagnosis not present

## 2018-04-02 DIAGNOSIS — I050 Rheumatic mitral stenosis: Secondary | ICD-10-CM

## 2018-04-02 DIAGNOSIS — I350 Nonrheumatic aortic (valve) stenosis: Secondary | ICD-10-CM | POA: Diagnosis not present

## 2018-04-02 NOTE — Progress Notes (Signed)
[address]       ,[place] [address]             [phone]      Allsion E Longton Bell Medical Record #[id number] Date of Birth: May 14, 1951  Referring: [name], MD Primary Care: [name], MD Primary Cardiologist: No primary care provider on file.   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 SURGICAL PROCEDURE:  Redo sternotomy; cardiopulmonary bypass with replacement of aortic valve with [employer] pericardial tissue valve, model 3300TFX, 19 mm, serial #[id number], and mitral valve replacement with [facility] pericardial tissue valve, model 7300TFX, 25 mm, serial #[id number]; placement of right femoral arterial line. SURGEON:  [name], MD.   History of Present Illness:     Patient returns to the office today after double valve replacement redo surgery done in early December.  The patient has known underlying cirrhosis  week.  She is currently being evaluated at [facility] for liver transplant.     Past Medical History:  Diagnosis Date  . Angina   . Asthma   . CHF (congestive heart failure) ([place])   . 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 COPD (chronic obstructive pulmonary disease) ([place])   . Coronary artery disease    s/p CABG  . Depression   . Diabetes mellitus    type 2  . Dyspnea   . Edema extremities    consistent edema lower legs, feet, and right quadrant abdominal pain-"goes and comes"  . Family history of adverse reaction to anesthesia    SISTER HAS NAUSEA  . Fibromyalgia   . GERD (gastroesophageal reflux disease)   . H/O hiatal hernia   . Headache(784.0)    occ. generalized  . Heart murmur   . Hypercholesteremia   . Hypertension     . Hypothyroid   . Myocardial infarction [facility]) 11/2011   MI x2- 2'54,2'70 coronary stent(chest pain episode at time)  . PONV (postoperative nausea and vomiting)      Social History   Tobacco Use  Smoking Status Never Smoker  Smokeless Tobacco Never Used    Social History   Substance and Sexual Activity  Alcohol Use No     Allergies  Allergen Reactions  . Exenatide Nausea And Vomiting  . Ace Inhibitors Other (See Comments)    pseudoasthma    Current Outpatient Medications  Medication Sig Dispense Refill  . albuterol (PROVENTIL HFA;VENTOLIN HFA) 108 (90 BASE) MCG/ACT inhaler Inhale 2 puffs into the lungs every 6 (six) hours as needed for wheezing or shortness of breath. 1 Inhaler 2  . albuterol (PROVENTIL) (2.5 MG/3ML) 0.083% nebulizer solution Take 3 mLs (2.5 mg total) by nebulization every 2 (two) hours as needed for wheezing. 75 mL 0  . DULoxetine (CYMBALTA) 30 MG capsule Take 30 mg by mouth daily with lunch. In conjunction with one 60 mg capsule to equal a total of 90 milligrams    . DULoxetine (CYMBALTA) 60 MG capsule Take 60 mg by mouth daily with lunch. In conjunction with one 30 mg capsule to equal a total of 90 milligrams    . furosemide (LASIX) 40 MG tablet Take 40 mg by mouth daily.    Marland Kitchen HUMALOG MIX 75/25 KWIKPEN (75-25) 100 UNIT/ML Kwikpen Inject  25 Units into the skin 2 (two) times daily. 15 mL 10  . KRISTALOSE 20 g packet Take 20 g by mouth 3 (three) times daily.  3  . levothyroxine (SYNTHROID, LEVOTHROID) 50 MCG tablet TAKE 1 TABLET(50 MCG) BY MOUTH DAILY BEFORE BREAKFAST 30 tablet 11  . mometasone-formoterol (DULERA) 100-5 MCG/ACT AERO Inhale 2 puffs into the lungs 2 (two) times daily as needed for wheezing or shortness of breath.    Marland Kitchen omeprazole-sodium bicarbonate (ZEGERID) 40-1100 MG per capsule Take 1 capsule by mouth daily before breakfast.    . spironolactone (ALDACTONE) 25 MG tablet Take 1 tablet (25 mg total) by mouth daily. 30 tablet 0  . XIFAXAN 550 MG  TABS tablet Take 550 mg by mouth 2 (two) times daily.  5   No current facility-administered medications for this visit.        Physical Exam: BP 133/71 (BP Location: Right Arm, Patient Position: Sitting, Cuff Size: Normal)   Pulse 66   Resp 16   Ht 5\' 2"  (1.575 m)   Wt 141 lb (64 kg)   LMP  (LMP Unknown)   SpO2 99% Comment: RA  BMI 25.79 kg/m   General appearance: alert, cooperative and no distress Head: Normocephalic, without obvious abnormality, atraumatic Neck: no adenopathy, no carotid bruit, no JVD, supple, symmetrical, trachea midline and thyroid not enlarged, symmetric, no tenderness/mass/nodules Lymph nodes: Cervical, supraclavicular, and axillary nodes normal. Resp: clear to auscultation bilaterally Back: symmetric, no curvature. ROM normal. No CVA tenderness. Cardio: regular rate and rhythm, S1, S2 normal, no murmur, click, rub or gallop GI: soft, non-tender; bowel sounds normal; no masses,  no organomegaly Extremities: extremities normal, atraumatic, no cyanosis  Or  Homans sign is negative, no sign of DVT, 2+ bilateral pedal edema Neurologic: Grossly normal    Diagnostic Studies & Laboratory data:     Recent Radiology Findings:   No results found.    Recent Lab Findings: Lab Results  Component Value Date   WBC 8.5 02/19/2018   HGB 8.2 (L) 02/19/2018   HCT 25.4 (L) 02/19/2018   PLT 46 (L) 02/19/2018   GLUCOSE 186 (H) 02/19/2018   CHOL 121 10/18/2017   TRIG 61 10/18/2017   HDL 33 (L) 10/18/2017   LDLCALC 76 10/18/2017   ALT 61 (H) 02/19/2018   AST 144 (H) 02/19/2018   NA 132 (L) 02/19/2018   K 4.0 02/19/2018   CL 104 02/19/2018   CREATININE 0.78 02/19/2018   BUN 19 02/19/2018   CO2 20 (L) 02/19/2018   TSH 7.323 (H) 12/17/2017   INR 1.46 02/19/2018   HGBA1C 7.5 (H) 11/10/2017      Assessment / Plan:   Patient stable from a cardiac standpoint following redo surgery in December.  To have follow-up echocardiogram at [facility] in preparation for   liver transplant. Plan to see her back as needed. Will refer her to cardiac rehab.  I  spent 15 minutes with  the patient face to face and greater then 50% of the time was spent in counseling and coordination of care.    [name] MD      [address] [place] [address] Office [phone]   Beeper [phone]  04/02/2018 3:50 PM

## 2018-04-20 ENCOUNTER — Other Ambulatory Visit: Payer: Self-pay | Admitting: Family Medicine

## 2018-04-20 DIAGNOSIS — Z139 Encounter for screening, unspecified: Secondary | ICD-10-CM

## 2018-04-21 ENCOUNTER — Telehealth (HOSPITAL_COMMUNITY): Payer: Self-pay | Admitting: *Deleted

## 2018-04-21 NOTE — Telephone Encounter (Signed)
Received phone call from Dr. Paulita Fujita office regarding the schedule of cardiac rehab at [facility].  Currently Mrs. Bloodsaw is scheduled for orientation on 6/27 and will return for exercise on 7/3.  Pt is in need of a liver transplant and has begun the preliminary pre transplant testing.  Per Dr. Paulita Fujita, pt will need to be involved in regular exercise to be eligible for liver transplant.  Per Pt  Her next follow up is in 3 months for reassessment of her activity level and exercise tolerance. Pt resides in [place] and there is a program at [facility] which may be able to get her in sooner.  Called and spoke to pt and she is in agreement to attend at [facility] if they are able to get her in and started sooner. Advised Mrs. Dubach that I had reached out to [facility] and I was waiting to hear back when they may be able to get her in.  Pt thanked me for the call. Cherre Huger, BSN Cardiac and Training and development officer

## 2018-05-05 ENCOUNTER — Other Ambulatory Visit: Payer: 59

## 2018-05-05 ENCOUNTER — Encounter (HOSPITAL_COMMUNITY)
Admission: RE | Admit: 2018-05-05 | Discharge: 2018-05-05 | Disposition: A | Discharge status: Home / Self Care | Payer: Medicare Other | Source: Ambulatory Visit | Attending: Cardiology | Admitting: Cardiology

## 2018-05-05 ENCOUNTER — Encounter (HOSPITAL_COMMUNITY): Payer: Self-pay

## 2018-05-05 VITALS — BP 110/50 | HR 95 | Ht 62.0 in | Wt 142.2 lb

## 2018-05-05 DIAGNOSIS — Z952 Presence of prosthetic heart valve: Secondary | ICD-10-CM

## 2018-05-05 DIAGNOSIS — I2510 Atherosclerotic heart disease of native coronary artery without angina pectoris: Secondary | ICD-10-CM | POA: Diagnosis not present

## 2018-05-05 DIAGNOSIS — Z79890 Hormone replacement therapy (postmenopausal): Secondary | ICD-10-CM | POA: Diagnosis not present

## 2018-05-05 DIAGNOSIS — E039 Hypothyroidism, unspecified: Secondary | ICD-10-CM | POA: Insufficient documentation

## 2018-05-05 DIAGNOSIS — I509 Heart failure, unspecified: Secondary | ICD-10-CM | POA: Insufficient documentation

## 2018-05-05 DIAGNOSIS — Z951 Presence of aortocoronary bypass graft: Secondary | ICD-10-CM | POA: Diagnosis not present

## 2018-05-05 DIAGNOSIS — Z79899 Other long term (current) drug therapy: Secondary | ICD-10-CM | POA: Diagnosis not present

## 2018-05-05 DIAGNOSIS — F329 Major depressive disorder, single episode, unspecified: Secondary | ICD-10-CM | POA: Diagnosis not present

## 2018-05-05 DIAGNOSIS — J449 Chronic obstructive pulmonary disease, unspecified: Secondary | ICD-10-CM | POA: Diagnosis not present

## 2018-05-05 DIAGNOSIS — E119 Type 2 diabetes mellitus without complications: Secondary | ICD-10-CM | POA: Insufficient documentation

## 2018-05-05 DIAGNOSIS — Z953 Presence of xenogenic heart valve: Secondary | ICD-10-CM | POA: Diagnosis not present

## 2018-05-05 DIAGNOSIS — K219 Gastro-esophageal reflux disease without esophagitis: Secondary | ICD-10-CM | POA: Insufficient documentation

## 2018-05-05 DIAGNOSIS — E785 Hyperlipidemia, unspecified: Secondary | ICD-10-CM | POA: Insufficient documentation

## 2018-05-05 DIAGNOSIS — I252 Old myocardial infarction: Secondary | ICD-10-CM | POA: Insufficient documentation

## 2018-05-05 DIAGNOSIS — I110 Hypertensive heart disease with heart failure: Secondary | ICD-10-CM | POA: Diagnosis not present

## 2018-05-05 DIAGNOSIS — M797 Fibromyalgia: Secondary | ICD-10-CM | POA: Insufficient documentation

## 2018-05-05 NOTE — Progress Notes (Signed)
Daily Session Note  Patient Details  Name: Denise Cuevas MRN: [id number] Date of Birth: 01-03-1951 Referring Provider:     Little Sturgeon from 05/05/2018 in [place]  Referring Provider  Dr. Servando Snare      Encounter Date: 05/05/2018  Check In: Session Check In - 05/05/18 1230      Check-In   Location  [facility]    Staff Present  Kaisyn Reinhold Angelina Pih, MS, EP, [facility], Exercise Physiologist;Gregory Luther Parody, BS, EP, Exercise Physiologist    Supervising physician immediately available to respond to emergencies  See telemetry face sheet for immediately available MD    Medication changes reported      No    Fall or balance concerns reported     Yes    Comments  Has fallen twice within 12 months.     Tobacco Cessation  -- Never smoked    Warm-up and Cool-down  Performed as group-led instruction    Resistance Training Performed  Yes Done with her own body weight    VAD Patient?  No      Pain Assessment   Currently in Pain?  No/denies    Pain Score  0-No pain    Multiple Pain Sites  No       Capillary Blood Glucose: No results found for this or any previous visit (from the past 24 hour(s)).    Social History   Tobacco Use  Smoking Status Never Smoker  Smokeless Tobacco Never Used    Goals Met:  Independence with exercise equipment Exercise tolerated well Personal goals reviewed No report of cardiac concerns or symptoms Strength training completed today  Goals Unmet:  Not Applicable  Comments: Check out: 1545   Dr. Kate Sable is Medical Director for [place] and Pulmonary Rehab.

## 2018-05-05 NOTE — Progress Notes (Signed)
Cardiac/Pulmonary Rehab Medication Review by a Pharmacist  Does the patient  feel that his/her medications are working for him/her?  yes  Has the patient been experiencing any side effects to the medications prescribed?  Something in morning causes fatigue. Rifaximin can cause fatigue. Patient takes cymbalta at lunch. Discussed with patient about this  Does the patient measure his/her own blood pressure or blood glucose at home?  Yes, blood glucose-chip installed in arm   Does the patient have any problems obtaining medications due to transportation or finances?   xifaxan and blood glucometer (chip testing) were costly but pharmacy has helped her find coupons/goodRx for cheaper prices.  Understanding of regimen: fair Understanding of indications: fair Potential of compliance: excellent  Questions asked to Determine Patient Understanding of Medication Regimen:  1. What is the name of the medication?  2. What is the medication used for?  3. When should it be taken?  4. How much should be taken?  5. How will you take it?  6. What side effects should you report?  Understanding Defined as: Excellent: All questions above are correct Good: Questions 1-4 are correct Fair: Questions 1-2 are correct  Poor: 1 or none of the above questions are correct   Pharmacist comments: Patient has help from husband in keeping regimen, use weekly pill containers.  Overall, seems compliant with regimen and fair knowledge of medications.  Patient has had difficulty affording new method of blood glucose checking and xifaxan but outpatient pharmacy and MD have assisted her with that.    Ramond Craver 05/05/2018 2:35 PM

## 2018-05-05 NOTE — Progress Notes (Signed)
Cardiac Individual Treatment Plan  Patient Details  Name: Denise Cuevas MRN: [id number] Date of Birth: 05/10/1951 Referring Provider:     [place] from 05/05/2018 in [place]  Referring Provider  Dr. Servando Snare      Initial Encounter Date:    CARDIAC REHAB PHASE II ORIENTATION from 05/05/2018 in [place]  Date  05/05/18  Referring Provider  Dr. Servando Snare      Visit Diagnosis: S/P AVR  S/P MVR (mitral valve replacement)  Patient's Home Medications on Admission:  Current Outpatient Medications:  .  ergocalciferol (VITAMIN D2) 50000 units capsule, Take 50,000 Units by mouth once a week. fridays, Disp: , Rfl:  .  albuterol (PROVENTIL HFA;VENTOLIN HFA) 108 (90 BASE) MCG/ACT inhaler, Inhale 2 puffs into the lungs every 6 (six) hours as needed for wheezing or shortness of breath., Disp: 1 Inhaler, Rfl: 2 .  albuterol (PROVENTIL) (2.5 MG/3ML) 0.083% nebulizer solution, Take 3 mLs (2.5 mg total) by nebulization every 2 (two) hours as needed for wheezing., Disp: 75 mL, Rfl: 0 .  DULoxetine (CYMBALTA) 30 MG capsule, Take 30 mg by mouth daily with lunch. In conjunction with one 60 mg capsule to equal a total of 90 milligrams, Disp: , Rfl:  .  DULoxetine (CYMBALTA) 60 MG capsule, Take 60 mg by mouth daily with lunch. In conjunction with one 30 mg capsule to equal a total of 90 milligrams, Disp: , Rfl:  .  furosemide (LASIX) 40 MG tablet, Take 40 mg by mouth daily., Disp: , Rfl:  .  HUMALOG MIX 75/25 KWIKPEN (75-25) 100 UNIT/ML Kwikpen, Inject 25 Units into the skin 2 (two) times daily., Disp: 15 mL, Rfl: 10 .  KRISTALOSE 20 g packet, Take 20 g by mouth 3 (three) times daily., Disp: , Rfl: 3 .  levothyroxine (SYNTHROID, LEVOTHROID) 50 MCG tablet, TAKE 1 TABLET(50 MCG) BY MOUTH DAILY BEFORE BREAKFAST, Disp: 30 tablet, Rfl: 11 .  mometasone-formoterol (DULERA) 100-5 MCG/ACT AERO, Inhale 2 puffs into the lungs 2 (two) times  daily as needed for wheezing or shortness of breath., Disp: , Rfl:  .  omeprazole-sodium bicarbonate (ZEGERID) 40-1100 MG per capsule, Take 1 capsule by mouth daily before breakfast., Disp: , Rfl:  .  spironolactone (ALDACTONE) 25 MG tablet, Take 1 tablet (25 mg total) by mouth daily., Disp: 30 tablet, Rfl: 0 .  XIFAXAN 550 MG TABS tablet, Take 550 mg by mouth 2 (two) times daily., Disp: , Rfl: 5  Past Medical History: Past Medical History:  Diagnosis Date  . Angina   . Asthma   . CHF (congestive heart failure) ([place])   . 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 COPD (chronic obstructive pulmonary disease) ([place])   . Coronary artery disease    s/p CABG  . Depression   . Diabetes mellitus    type 2  . Dyspnea   . Edema extremities    consistent edema lower legs, feet, and right quadrant abdominal pain-"goes and comes"  . Family history of adverse reaction to anesthesia    SISTER HAS NAUSEA  . Fibromyalgia   . GERD (gastroesophageal reflux disease)   . H/O hiatal hernia   . Headache(784.0)    occ. generalized  . Heart murmur   . Hypercholesteremia   . Hypertension   . Hypothyroid   . Myocardial infarction [facility]) 11/2011   MI x2- 5'28,4'13 coronary stent(chest pain episode at time)  . PONV (postoperative nausea and vomiting)  Tobacco Use: Social History   Tobacco Use  Smoking Status Never Smoker  Smokeless Tobacco Never Used    Labs: Recent Review Flowsheet Data    Labs for ITP Cardiac and Pulmonary Rehab Latest Ref Rng & Units 11/13/2017 11/13/2017 11/14/2017 11/15/2017 02/16/2018   Cholestrol 0 - 200 mg/dL - - - - -   LDLCALC 0 - 99 mg/dL - - - - -   HDL >40 mg/dL - - - - -   Trlycerides <150 mg/dL - - - - -   Hemoglobin A1c 4.8 - 5.6 % - - - - -   PHART 7.350 - 7.450 7.415 - - - 7.388   PCO2ART 32.0 - 48.0 mmHg 35.6 - - - 26.9(L)   HCO3 20.0 - 28.0 mmol/L 22.8 - - - 16.2(L)   TCO2 22 - 32 mmol/L 24 23 - - 17(L)   ACIDBASEDEF 0.0 - 2.0 mmol/L 1.0 - - -  8.0(H)   O2SAT % 97.0 - 75.7 67.5 96.0      Capillary Blood Glucose: Lab Results  Component Value Date   GLUCAP 165 (H) 02/20/2018   GLUCAP 235 (H) 02/19/2018   GLUCAP 224 (H) 02/19/2018   GLUCAP 350 (H) 02/19/2018   GLUCAP 180 (H) 02/19/2018     Exercise Target Goals: Date: 05/05/18  Exercise Program Goal: Individual exercise prescription set using results from initial 6 min walk test and THRR while considering  patient's activity barriers and safety.   Exercise Prescription Goal: Starting with aerobic activity 30 plus minutes a day, 3 days per week for initial exercise prescription. Provide home exercise prescription and guidelines that participant acknowledges understanding prior to discharge.  Activity Barriers & Risk Stratification: Activity Barriers & Cardiac Risk Stratification - 05/05/18 1531      Activity Barriers & Cardiac Risk Stratification   Activity Barriers  Deconditioning;Balance Concerns;Muscular Weakness    Cardiac Risk Stratification  High       6 Minute Walk: 6 Minute Walk    Row Name 05/05/18 1531         6 Minute Walk   Phase  Initial     Distance  850 feet     Distance % Change  0 %     Distance Feet Change  0 ft     Walk Time  6 minutes     # of Rest Breaks  0     MPH  1.6     METS  2.23     RPE  15     Perceived Dyspnea   12     VO2 Peak  8.42     Symptoms  No     Resting HR  95 bpm     Resting BP  110/50     Resting Oxygen Saturation   100 %     Exercise Oxygen Saturation  during 6 min walk  81 %     Max Ex. HR  92 bpm     Max Ex. BP  140/60     2 Minute Post BP  120/56        Oxygen Initial Assessment:   Oxygen Re-Evaluation:   Oxygen Discharge (Final Oxygen Re-Evaluation):   Initial Exercise Prescription: Initial Exercise Prescription - 05/05/18 1500      Date of Initial Exercise RX and Referring Provider   Date  05/05/18    Referring Provider  Dr. Servando Snare      NuStep   Level  1    SPM  65    Minutes  15     METs  1.8      Arm Ergometer   Level  1    Watts  12    RPM  15    Minutes  20    METs  1.9      Prescription Details   Frequency (times per week)  3    Duration  Progress to 30 minutes of continuous aerobic without signs/symptoms of physical distress      Intensity   THRR 40-80% of Max Heartrate  [phone]    Ratings of Perceived Exertion  11-13    Perceived Dyspnea  0-4      Progression   Progression  Continue progressive overload as per policy without signs/symptoms or physical distress.      Resistance Training   Training Prescription  Yes    Weight  1    Reps  10-15       Perform Capillary Blood Glucose checks as needed.  Exercise Prescription Changes:   Exercise Comments:   Exercise Goals and Review:  Exercise Goals    Row Name 05/05/18 1533             Exercise Goals   Increase Physical Activity  Yes       Intervention  Provide advice, education, support and counseling about physical activity/exercise needs.;Develop an individualized exercise prescription for aerobic and resistive training based on initial evaluation findings, risk stratification, comorbidities and participant's personal goals.       Expected Outcomes  Short Term: Attend rehab on a regular basis to increase amount of physical activity.       Increase Strength and Stamina  Yes       Intervention  Provide advice, education, support and counseling about physical activity/exercise needs.;Develop an individualized exercise prescription for aerobic and resistive training based on initial evaluation findings, risk stratification, comorbidities and participant's personal goals.       Expected Outcomes  Short Term: Increase workloads from initial exercise prescription for resistance, speed, and METs.       Able to understand and use rate of perceived exertion (RPE) scale  Yes       Intervention  Provide education and explanation on how to use RPE scale       Expected Outcomes  Long Term:  Able  to use RPE to guide intensity level when exercising independently;Short Term: Able to use RPE daily in rehab to express subjective intensity level       Able to understand and use Dyspnea scale  Yes       Intervention  Provide education and explanation on how to use Dyspnea scale       Expected Outcomes  Short Term: Able to use Dyspnea scale daily in rehab to express subjective sense of shortness of breath during exertion;Long Term: Able to use Dyspnea scale to guide intensity level when exercising independently       Knowledge and understanding of Target Heart Rate Range (THRR)  Yes       Intervention  Provide education and explanation of THRR including how the numbers were predicted and where they are located for reference       Expected Outcomes  Short Term: Able to state/look up THRR       Able to check pulse independently  Yes       Intervention  Provide education and demonstration on how to check pulse in carotid and radial arteries.;Review the importance of being  able to check your own pulse for safety during independent exercise       Expected Outcomes  Short Term: Able to explain why pulse checking is important during independent exercise;Long Term: Able to check pulse independently and accurately       Understanding of Exercise Prescription  Yes       Intervention  Provide education, explanation, and written materials on patient's individual exercise prescription       Expected Outcomes  Long Term: Able to explain home exercise prescription to exercise independently;Short Term: Able to explain program exercise prescription          Exercise Goals Re-Evaluation :    Discharge Exercise Prescription (Final Exercise Prescription Changes):   Nutrition:  Target Goals: Understanding of nutrition guidelines, daily intake of sodium 1500mg , cholesterol 200mg , calories 30% from fat and 7% or less from saturated fats, daily to have 5 or more servings of fruits and  vegetables.  Biometrics: Pre Biometrics - 05/05/18 1534      Pre Biometrics   Height  5\' 2"  (1.575 m)    Weight  142 lb 3.2 oz (64.5 kg)    Waist Circumference  34.5 inches    Hip Circumference  37.5 inches    Waist to Hip Ratio  0.92 %    BMI (Calculated)  26    Triceps Skinfold  10 mm    % Body Fat  32.6 %    Grip Strength  34.2 kg    Flexibility  12.5 in    Single Leg Stand  0 seconds Balance Concerns        Nutrition Therapy Plan and Nutrition Goals: Nutrition Therapy & Goals - 05/05/18 1624      Personal Nutrition Goals   Personal Goal #2  Patient is eating a heart healthy along with low sodium diet.     Additional Goals?  No       Nutrition Assessments: Nutrition Assessments - 05/05/18 1624      MEDFICTS Scores   Pre Score  30       Nutrition Goals Re-Evaluation:   Nutrition Goals Discharge (Final Nutrition Goals Re-Evaluation):   Psychosocial: Target Goals: Acknowledge presence or absence of significant depression and/or stress, maximize coping skills, provide positive support system. Participant is able to verbalize types and ability to use techniques and skills needed for reducing stress and depression.  Initial Review & Psychosocial Screening: Initial Psych Review & Screening - 05/05/18 1627      Initial Review   Current issues with  Current Depression      Family Dynamics   Good Support System?  Yes      Barriers   Psychosocial barriers to participate in program  Psychosocial barriers identified (see note)      Screening Interventions   Interventions  Encouraged to exercise;To provide support and resources with identified psychosocial needs;Provide feedback about the scores to participant    Expected Outcomes  Short Term goal: Identification and review with participant of any Quality of Life or Depression concerns found by scoring the questionnaire.;Long Term goal: The participant improves quality of Life and PHQ9 Scores as seen by post scores  and/or verbalization of changes       Quality of Life Scores: Quality of Life - 05/05/18 1535      Quality of Life Scores   Health/Function Pre  12.07 %    Socioeconomic Pre  27.38 %    Psych/Spiritual Pre  23.14 %    Family Pre  18 %    GLOBAL Pre  18.82 %      Scores of 19 and below usually indicate a poorer quality of life in these areas.  A difference of  2-3 points is a clinically meaningful difference.  A difference of 2-3 points in the total score of the Quality of Life Index has been associated with significant improvement in overall quality of life, self-image, physical symptoms, and general health in studies assessing change in quality of life.  PHQ-9: Recent Review Flowsheet Data    Depression screen [facility] 2/9 05/05/2018 10/29/2017 04/13/2012 01/27/2012   Decreased Interest 2 0 1 1   Down, Depressed, Hopeless 2 0 1 1   PHQ - 2 Score 4 0 2 2   Altered sleeping 2 - - -   Tired, decreased energy 2 - - -   Change in appetite 2 - - -   Feeling bad or failure about yourself  2 - - -   Trouble concentrating 1 - - -   Moving slowly or fidgety/restless 2 - - -   Suicidal thoughts 0 - - -   PHQ-9 Score 15 - - -   Difficult doing work/chores Very difficult - - -     Interpretation of Total Score  Total Score Depression Severity:  1-4 = Minimal depression, 5-9 = Mild depression, 10-14 = Moderate depression, 15-19 = Moderately severe depression, 20-27 = Severe depression   Psychosocial Evaluation and Intervention: Psychosocial Evaluation - 05/05/18 1628      Psychosocial Evaluation & Interventions   Interventions  Encouraged to exercise with the program and follow exercise prescription;Relaxation education;Stress management education    Continue Psychosocial Services   Follow up required by staff       Psychosocial Re-Evaluation:   Psychosocial Discharge (Final Psychosocial Re-Evaluation):   Vocational Rehabilitation: Provide vocational rehab assistance to qualifying  candidates.   Vocational Rehab Evaluation & Intervention: Vocational Rehab - 05/05/18 1612      Initial Vocational Rehab Evaluation & Intervention   Assessment shows need for Vocational Rehabilitation  No       Education: Education Goals: Education classes will be provided on a weekly basis, covering required topics. Participant will state understanding/return demonstration of topics presented.  Learning Barriers/Preferences: Learning Barriers/Preferences - 05/05/18 1610      Learning Barriers/Preferences   Learning Barriers  None    Learning Preferences  Written Material;Pictoral;Computer/Internet       Education Topics: Hypertension, Hypertension Reduction -Define heart disease and high blood pressure. Discus how high blood pressure affects the body and ways to reduce high blood pressure.   Exercise and Your Heart -Discuss why it is important to exercise, the FITT principles of exercise, normal and abnormal responses to exercise, and how to exercise safely.   Angina -Discuss definition of angina, causes of angina, treatment of angina, and how to decrease risk of having angina.   Cardiac Medications -Review what the following cardiac medications are used for, how they affect the body, and side effects that may occur when taking the medications.  Medications include Aspirin, Beta blockers, calcium channel blockers, ACE Inhibitors, angiotensin receptor blockers, diuretics, digoxin, and antihyperlipidemics.   Congestive Heart Failure -Discuss the definition of CHF, how to live with CHF, the signs and symptoms of CHF, and how keep track of weight and sodium intake.   Heart Disease and Intimacy -Discus the effect sexual activity has on the heart, how changes occur during intimacy as we age, and safety during sexual activity.  Smoking Cessation / COPD -Discuss different methods to quit smoking, the health benefits of quitting smoking, and the definition of  COPD.   Nutrition I: Fats -Discuss the types of cholesterol, what cholesterol does to the heart, and how cholesterol levels can be controlled.   Nutrition II: Labels -Discuss the different components of food labels and how to read food label   Heart Parts/Heart Disease and PAD -Discuss the anatomy of the heart, the pathway of blood circulation through the heart, and these are affected by heart disease.   Stress I: Signs and Symptoms -Discuss the causes of stress, how stress may lead to anxiety and depression, and ways to limit stress.   Stress II: Relaxation -Discuss different types of relaxation techniques to limit stress.   Warning Signs of Stroke / TIA -Discuss definition of a stroke, what the signs and symptoms are of a stroke, and how to identify when someone is having stroke.   Knowledge Questionnaire Score: Knowledge Questionnaire Score - 05/05/18 1610      Knowledge Questionnaire Score   Pre Score  22/24       Core Components/Risk Factors/Patient Goals at Admission: Personal Goals and Risk Factors at Admission - 05/05/18 1624      Core Components/Risk Factors/Patient Goals on Admission    Weight Management  Weight Maintenance    Diabetes  Yes    Intervention  Provide education about signs/symptoms and action to take for hypo/hyperglycemia.    Expected Outcomes  Short Term: Participant verbalizes understanding of the signs/symptoms and immediate care of hyper/hypoglycemia, proper foot care and importance of medication, aerobic/resistive exercise and nutrition plan for blood glucose control.;Long Term: Attainment of HbA1C < 7%.    Stress  Yes    Intervention  Offer individual and/or small group education and counseling on adjustment to heart disease, stress management and health-related lifestyle change. Teach and support self-help strategies.    Expected Outcomes  Short Term: Participant demonstrates changes in health-related behavior, relaxation and other stress  management skills, ability to obtain effective social support, and compliance with psychotropic medications if prescribed.;Long Term: Emotional wellbeing is indicated by absence of clinically significant psychosocial distress or social isolation.    Personal Goal Other  Yes    Personal Goal  Gain more mobility, Drive again, be more independent    Intervention  Attend CR 3 x week and supplement at home exercise 2 x week.     Expected Outcomes  Achieve personal goals.        Core Components/Risk Factors/Patient Goals Review:    Core Components/Risk Factors/Patient Goals at Discharge (Final Review):    ITP Comments: ITP Comments    Row Name 05/05/18 1613           ITP Comments  Mrs. Pallas is a pleasant 67 year old patient. She has been through the program in 2012 due to previous heart events. She has cirrhosis of liver non alcohol induced. She has poor balance. She is very egaer to get started to regain her strength.           Comments: Patient arrived for 1st visit/orientation/education at 1230. Patient was referred to CR by Dr. Servando Snare due to AVR/MVR (Z95.2). During orientation advised patient on arrival and appointment times what to wear, what to do before, during and after exercise. Reviewed attendance and class policy. Talked about inclement weather and class consultation policy. Pt is scheduled to return Cardiac Rehab on 05/06/18 at 0930. Pt was advised to come to class 15  minutes before class starts. Patient was also given instructions on meeting with the dietician and attending the Family Structure classes. Discussed RPE/Dpysnea scales. Discussed initial THR and how to find their radial and/or carotid pulse. Discussed the initial exercise prescription and how this effects their progress. Pt is eager to get started. Patient participated in warm up stretches followed by light weights and resistance bands. Patient was able to complete 6 minute walk test. Patient c/o pain in both thighs at  8/10. Pain subsided to 0/10 at 2 minute rest. Pain was completely gone at the end of orientation. Patient was measured for the equipment. Discussed equipment safety with patient. Took patient pre-anthropometric measurements. Patient finished visit at 1545.

## 2018-05-06 ENCOUNTER — Encounter (HOSPITAL_COMMUNITY)
Admission: RE | Admit: 2018-05-06 | Discharge: 2018-05-06 | Disposition: A | Discharge status: Home / Self Care | Payer: Medicare Other | Source: Ambulatory Visit | Attending: Cardiology | Admitting: Cardiology

## 2018-05-06 DIAGNOSIS — Z953 Presence of xenogenic heart valve: Secondary | ICD-10-CM | POA: Diagnosis not present

## 2018-05-06 NOTE — Progress Notes (Signed)
Daily Session Note  Patient Details  Name: Denise Cuevas MRN: [id number] Date of Birth: November 01, 1951 Referring Provider:     CARDIAC REHAB PHASE II ORIENTATION from 05/05/2018 in [place]  Referring Provider  Dr. Servando Snare      Encounter Date: 05/06/2018  Check In: Session Check In - 05/06/18 0930      Check-In   Location  [facility]    Staff Present  Russella Dar, MS, EP, [facility], Exercise Physiologist;Gregory Luther Parody, BS, EP, Exercise Physiologist    Supervising physician immediately available to respond to emergencies  See telemetry face sheet for immediately available MD    Medication changes reported      No    Fall or balance concerns reported     No    Warm-up and Cool-down  Performed as group-led instruction    Resistance Training Performed  Yes    VAD Patient?  No      Pain Assessment   Currently in Pain?  No/denies    Pain Score  0-No pain    Multiple Pain Sites  No       Capillary Blood Glucose: No results found for this or any previous visit (from the past 24 hour(s)).    Social History   Tobacco Use  Smoking Status Never Smoker  Smokeless Tobacco Never Used    Goals Met:  Independence with exercise equipment Exercise tolerated well No report of cardiac concerns or symptoms Strength training completed today  Goals Unmet:  Not Applicable  Comments: Check out: 1030   Dr. Kate Sable is Medical Director for [place] and Pulmonary Rehab.

## 2018-05-08 ENCOUNTER — Ambulatory Visit: Payer: Medicare Other | Admitting: Podiatry

## 2018-05-08 ENCOUNTER — Encounter: Payer: Self-pay | Admitting: Podiatry

## 2018-05-08 ENCOUNTER — Encounter (HOSPITAL_COMMUNITY): Payer: Medicare Other

## 2018-05-08 ENCOUNTER — Encounter (HOSPITAL_COMMUNITY)
Admission: RE | Admit: 2018-05-08 | Discharge: 2018-05-08 | Disposition: A | Discharge status: Home / Self Care | Payer: Medicare Other | Source: Ambulatory Visit | Attending: Cardiology | Admitting: Cardiology

## 2018-05-08 DIAGNOSIS — B351 Tinea unguium: Secondary | ICD-10-CM | POA: Diagnosis not present

## 2018-05-08 DIAGNOSIS — R601 Generalized edema: Secondary | ICD-10-CM

## 2018-05-08 DIAGNOSIS — Z952 Presence of prosthetic heart valve: Secondary | ICD-10-CM

## 2018-05-08 DIAGNOSIS — E1151 Type 2 diabetes mellitus with diabetic peripheral angiopathy without gangrene: Secondary | ICD-10-CM | POA: Diagnosis not present

## 2018-05-08 DIAGNOSIS — Z953 Presence of xenogenic heart valve: Secondary | ICD-10-CM | POA: Diagnosis not present

## 2018-05-08 NOTE — Progress Notes (Signed)
Daily Session Note  Patient Details  Name: Denise Cuevas MRN: [id number] Date of Birth: 23-Oct-1951 Referring Provider:     [place] from 05/05/2018 in [place]  Referring Provider  Dr. Servando Snare      Encounter Date: 05/08/2018  Check In: Session Check In - 05/08/18 1542      Check-In   Location  [facility]    Staff Present  Suzanne Boron, BS, EP, Exercise Physiologist;Yaakov Saindon Wynetta Emery, RN, BSN    Supervising physician immediately available to respond to emergencies  See telemetry face sheet for immediately available MD    Medication changes reported      No    Fall or balance concerns reported     Yes    Comments  Has fallen twice within 12 months.     Warm-up and Cool-down  Performed as group-led Higher education careers adviser Performed  Yes    VAD Patient?  No      Pain Assessment   Currently in Pain?  No/denies    Pain Score  0-No pain    Multiple Pain Sites  No       Capillary Blood Glucose: No results found for this or any previous visit (from the past 24 hour(s)).  Exercise Prescription Changes - 05/08/18 1200      Response to Exercise   Blood Pressure (Admit)  130/62    Blood Pressure (Exercise)  120/70    Blood Pressure (Exit)  108/62    Heart Rate (Admit)  95 bpm    Heart Rate (Exercise)  101 bpm    Heart Rate (Exit)  100 bpm    Rating of Perceived Exertion (Exercise)  13    Duration  Progress to 30 minutes of  aerobic without signs/symptoms of physical distress    Intensity  THRR New [phone]      Progression   Progression  Continue to progress workloads to maintain intensity without signs/symptoms of physical distress.      Resistance Training   Training Prescription  Yes    Weight  1    Reps  10-15      NuStep   Level  1    SPM  57    Minutes  15    METs  1.6      Arm Ergometer   Level  1    Watts  11    RPM  15    Minutes  20    METs  1.8       Social History    Tobacco Use  Smoking Status Never Smoker  Smokeless Tobacco Never Used    Goals Met:  Independence with exercise equipment Exercise tolerated well No report of cardiac concerns or symptoms Strength training completed today  Goals Unmet:  Not Applicable  Comments: Check out 1645.   Dr. Kate Sable is Medical Director for [facility] Cardiac and Pulmonary Rehab.

## 2018-05-09 ENCOUNTER — Encounter: Payer: Self-pay | Admitting: Podiatry

## 2018-05-09 NOTE — Progress Notes (Addendum)
Subjective: Patient presents today referred by Doctor for Comprehensive Diabetic Foot Examination.   Patient diagnosed with diabetes at age: Patient has Type 2 diabetes  Current Diabetes Management Regimen is: Insulin  Last A1C: 7.5 (11/10/17)  Patient has diabetes and cc of painful, discolored, thick toenails which interfere with her ability of performing routine tasks. Pain is aggravated when wearing enclosed shoe gear. Pain is relieved with periodic professional debridement.  Medical History: Pt  has peripheral neuropathy Pt has cardiovascular disease  Current History: 1. No change in the foot or feet since last evaluation. 2. No current ulcer or history of foot ulcer. 3. No pain in the calf muscles when walking that is relieved by rest.   Foot Examination:  Vascular Examination: Capillary refill time <3 seconds x 10 digits Dorsalis pedis pulses  1/4 b/l Posterior tibial pulses diminished b/l No digital hair x 10 digits Skin temperature normal b/l Edema BLE  Dermatological Examination: Skin with normal texture and tone  Toenails 1-5 b/l discolored, thick, dystrophic with subungual debris and pain with palpation to nailbeds due to thickness of nails.  Callus(es) noted: None today Redness noted: None Warmth noted: None Fissure noted: None Swelling noted: BLE Present b/l Ulcer noted: None Maceration noted: None Preulcerative lesion noted: None Dry skin noted: None  Musculoskeletal: Muscle strength 5/5 to all LE muscle groups Toe deformities noted: Hammertoe b/l 5th digits Bunions present: bilaterall Charcot foot present: None Foot Drop present: None Prominent metatarsal heads: 5th left foot Amputation (date, side and level): No amputations  Sensory Foot Examination: Sensation tested with 10 gram monofilament: Right great toe plantarly: + Right 4th toe plantarly: + Submetatarsal head 1 right foot: + Submetatarsal head 3 right foot: + Submetatarsal head 5  right foot: + Left great toe plantarly: + Left 4th toe plantarly:+  Submetatarsal head 1 left foot: + Submetatarsal head 3 left foot: + Submetatarsal head 5 left foot: +  Risk Categorization: Low Risk Patient has all of the following: Intact protective sensation: Yes No prior foot ulcer: Yes No severe deformity Pedal pulses present  High Risk Patient has one or more of the following: Loss of protective sensation Absent pedal pulses: Absent PT pulses Severe Foot deformity: HAV with bunion; hammertoes History of foot ulcer  Footwear Assessment: Does the patient wear appropriate shoes? No. Needs shoe gear to accommodate edema. Does the patient need inserts/orthotics? Yes.  Assessment: 1. Painful onychomycosis toenails 1-5 b/l 2. NIDDM with Peripheral arterial disease 3. Insert foot deformity diagnoses  Management Plan: Patient underwent complete diabetic foot examination on today. 1. Pt education provided for preventative foot care 2. Toenails 1-5 b/l were debrided in length and girth without iatrogenic bleeding. 3. Patient qualifies for diabetic shoes and insoles based on my examination today with diagnoses of: Absent PT pulses, Hammertoes, Bunion deformity 4. Patient to report any pedal injuries to medical professional  5.  Patient will need routine diabetic foot care. Follow up 3 months.  6.  Patient/POA to  call should there be a concern in the interim. 7. Refer to Pedorthist for evaluation for diabetic shoes  with total contact insoles. In the interim, Ms. Duch is to wear Skechers with stretchable uppers to accommodate pedal edema. She related understanding at today's visit.

## 2018-05-11 ENCOUNTER — Encounter (HOSPITAL_COMMUNITY)
Admission: RE | Admit: 2018-05-11 | Discharge: 2018-05-11 | Disposition: A | Discharge status: Home / Self Care | Payer: Medicare Other | Source: Ambulatory Visit | Attending: Cardiology | Admitting: Cardiology

## 2018-05-11 DIAGNOSIS — Z79899 Other long term (current) drug therapy: Secondary | ICD-10-CM | POA: Diagnosis not present

## 2018-05-11 DIAGNOSIS — E039 Hypothyroidism, unspecified: Secondary | ICD-10-CM | POA: Insufficient documentation

## 2018-05-11 DIAGNOSIS — I110 Hypertensive heart disease with heart failure: Secondary | ICD-10-CM | POA: Diagnosis not present

## 2018-05-11 DIAGNOSIS — I509 Heart failure, unspecified: Secondary | ICD-10-CM | POA: Insufficient documentation

## 2018-05-11 DIAGNOSIS — Z79890 Hormone replacement therapy (postmenopausal): Secondary | ICD-10-CM | POA: Insufficient documentation

## 2018-05-11 DIAGNOSIS — I2510 Atherosclerotic heart disease of native coronary artery without angina pectoris: Secondary | ICD-10-CM | POA: Diagnosis not present

## 2018-05-11 DIAGNOSIS — K219 Gastro-esophageal reflux disease without esophagitis: Secondary | ICD-10-CM | POA: Diagnosis not present

## 2018-05-11 DIAGNOSIS — J449 Chronic obstructive pulmonary disease, unspecified: Secondary | ICD-10-CM | POA: Diagnosis not present

## 2018-05-11 DIAGNOSIS — E119 Type 2 diabetes mellitus without complications: Secondary | ICD-10-CM | POA: Diagnosis not present

## 2018-05-11 DIAGNOSIS — Z952 Presence of prosthetic heart valve: Secondary | ICD-10-CM

## 2018-05-11 DIAGNOSIS — M797 Fibromyalgia: Secondary | ICD-10-CM | POA: Diagnosis not present

## 2018-05-11 DIAGNOSIS — F329 Major depressive disorder, single episode, unspecified: Secondary | ICD-10-CM | POA: Insufficient documentation

## 2018-05-11 DIAGNOSIS — I252 Old myocardial infarction: Secondary | ICD-10-CM | POA: Diagnosis not present

## 2018-05-11 DIAGNOSIS — E785 Hyperlipidemia, unspecified: Secondary | ICD-10-CM | POA: Diagnosis not present

## 2018-05-11 DIAGNOSIS — Z953 Presence of xenogenic heart valve: Secondary | ICD-10-CM | POA: Diagnosis present

## 2018-05-11 DIAGNOSIS — Z951 Presence of aortocoronary bypass graft: Secondary | ICD-10-CM | POA: Diagnosis not present

## 2018-05-11 NOTE — Progress Notes (Signed)
Daily Session Note  Patient Details  Name: Denise Cuevas MRN: [id number] Date of Birth: 12-19-1950 Referring Provider:     [place] from 05/05/2018 in [place]  Referring Provider  Dr. Servando Snare      Encounter Date: 05/11/2018  Check In: Session Check In - 05/11/18 0930      Check-In   Location  [facility]    Staff Present  Suzanne Boron, BS, EP, Exercise Physiologist;Milianna Ericsson Wynetta Emery, RN, BSN;Diane Coad, MS, EP, [facility], Exercise Physiologist    Supervising physician immediately available to respond to emergencies  See telemetry face sheet for immediately available MD    Medication changes reported      No    Fall or balance concerns reported     Yes    Comments  Has fallen twice within 12 months.     Warm-up and Cool-down  Performed as group-led Higher education careers adviser Performed  Yes    VAD Patient?  No      Pain Assessment   Currently in Pain?  No/denies    Pain Score  0-No pain    Multiple Pain Sites  No       Capillary Blood Glucose: No results found for this or any previous visit (from the past 24 hour(s)).    Social History   Tobacco Use  Smoking Status Never Smoker  Smokeless Tobacco Never Used    Goals Met:  Independence with exercise equipment Exercise tolerated well No report of cardiac concerns or symptoms Strength training completed today  Goals Unmet:  Not Applicable  Comments: Check out 1030.   Dr. Kate Sable is Medical Director for [facility] Cardiac and Pulmonary Rehab.

## 2018-05-13 ENCOUNTER — Encounter (HOSPITAL_COMMUNITY)
Admission: RE | Admit: 2018-05-13 | Discharge: 2018-05-13 | Disposition: A | Discharge status: Home / Self Care | Payer: Medicare Other | Source: Ambulatory Visit | Attending: Cardiology | Admitting: Cardiology

## 2018-05-13 DIAGNOSIS — Z953 Presence of xenogenic heart valve: Secondary | ICD-10-CM | POA: Diagnosis not present

## 2018-05-13 NOTE — Progress Notes (Signed)
Daily Session Note  Patient Details  Name: Denise Cuevas MRN: [id number] Date of Birth: January 08, 1951 Referring Provider:     CARDIAC REHAB PHASE II ORIENTATION from 05/05/2018 in [place]  Referring Provider  Dr. Servando Snare      Encounter Date: 05/13/2018  Check In: Session Check In - 05/13/18 0930      Check-In   Location  [facility]    Staff Present  Russella Dar, MS, EP, [facility], Exercise Physiologist;Debra Wynetta Emery, RN, BSN    Supervising physician immediately available to respond to emergencies  See telemetry face sheet for immediately available MD    Medication changes reported      No    Fall or balance concerns reported     Yes    Warm-up and Cool-down  Performed as group-led instruction    Resistance Training Performed  Yes    VAD Patient?  No      Pain Assessment   Currently in Pain?  No/denies    Pain Score  0-No pain    Multiple Pain Sites  No       Capillary Blood Glucose: No results found for this or any previous visit (from the past 24 hour(s)).    Social History   Tobacco Use  Smoking Status Never Smoker  Smokeless Tobacco Never Used    Goals Met:  Independence with exercise equipment Exercise tolerated well Personal goals reviewed No report of cardiac concerns or symptoms Strength training completed today  Goals Unmet:  Not Applicable  Comments: Check out: 1030   Dr. Kate Sable is Medical Director for [place] and Pulmonary Rehab.

## 2018-05-15 ENCOUNTER — Encounter (HOSPITAL_COMMUNITY): Payer: Medicare Other

## 2018-05-18 ENCOUNTER — Other Ambulatory Visit: Payer: Self-pay | Admitting: Obstetrics and Gynecology

## 2018-05-18 ENCOUNTER — Other Ambulatory Visit (HOSPITAL_COMMUNITY)
Admission: RE | Admit: 2018-05-18 | Discharge: 2018-05-18 | Disposition: A | Discharge status: Home / Self Care | Payer: Medicare Other | Source: Ambulatory Visit | Attending: Obstetrics and Gynecology | Admitting: Obstetrics and Gynecology

## 2018-05-18 ENCOUNTER — Encounter (HOSPITAL_COMMUNITY)
Admission: RE | Admit: 2018-05-18 | Discharge: 2018-05-18 | Disposition: A | Discharge status: Home / Self Care | Payer: Medicare Other | Source: Ambulatory Visit | Attending: Cardiology | Admitting: Cardiology

## 2018-05-18 DIAGNOSIS — Z01411 Encounter for gynecological examination (general) (routine) with abnormal findings: Secondary | ICD-10-CM | POA: Insufficient documentation

## 2018-05-18 DIAGNOSIS — Z952 Presence of prosthetic heart valve: Secondary | ICD-10-CM

## 2018-05-18 DIAGNOSIS — Z953 Presence of xenogenic heart valve: Secondary | ICD-10-CM | POA: Diagnosis not present

## 2018-05-18 NOTE — Progress Notes (Signed)
Daily Session Note  Patient Details  Name: Denise Cuevas MRN: [id number] Date of Birth: 03-25-1951 Referring Provider:     [place] from 05/05/2018 in [place]  Referring Provider  Dr. Servando Snare      Encounter Date: 05/18/2018  Check In: Session Check In - 05/18/18 1545      Check-In   Location  [facility]    Staff Present  Diane Angelina Pih, MS, EP, [facility], Exercise Physiologist;Marjory Meints Wynetta Emery, RN, BSN;Gregory Cowan, BS, EP, Exercise Physiologist    Supervising physician immediately available to respond to emergencies  See telemetry face sheet for immediately available MD    Medication changes reported      No    Fall or balance concerns reported     Yes    Comments  Has fallen twice within 12 months.     Warm-up and Cool-down  Performed as group-led Higher education careers adviser Performed  Yes    VAD Patient?  No      Pain Assessment   Currently in Pain?  No/denies    Pain Score  0-No pain    Multiple Pain Sites  No       Capillary Blood Glucose: No results found for this or any previous visit (from the past 24 hour(s)).    Social History   Tobacco Use  Smoking Status Never Smoker  Smokeless Tobacco Never Used    Goals Met:  Independence with exercise equipment Exercise tolerated well No report of cardiac concerns or symptoms Strength training completed today  Goals Unmet:  Not Applicable  Comments: Check out 1645.   Dr. Kate Sable is Medical Director for [facility] Cardiac and Pulmonary Rehab.

## 2018-05-19 ENCOUNTER — Ambulatory Visit
Admission: RE | Admit: 2018-05-19 | Discharge: 2018-05-19 | Disposition: A | Discharge status: Home / Self Care | Payer: Medicare Other | Source: Ambulatory Visit | Attending: Family Medicine | Admitting: Family Medicine

## 2018-05-19 DIAGNOSIS — Z139 Encounter for screening, unspecified: Secondary | ICD-10-CM

## 2018-05-20 ENCOUNTER — Encounter (HOSPITAL_COMMUNITY)
Admission: RE | Admit: 2018-05-20 | Discharge: 2018-05-20 | Disposition: A | Discharge status: Home / Self Care | Payer: Medicare Other | Source: Ambulatory Visit | Attending: Cardiology | Admitting: Cardiology

## 2018-05-20 ENCOUNTER — Encounter (HOSPITAL_COMMUNITY): Payer: Medicare Other

## 2018-05-20 ENCOUNTER — Other Ambulatory Visit: Payer: Self-pay | Admitting: Family Medicine

## 2018-05-20 DIAGNOSIS — Z953 Presence of xenogenic heart valve: Secondary | ICD-10-CM | POA: Diagnosis not present

## 2018-05-20 DIAGNOSIS — Z952 Presence of prosthetic heart valve: Secondary | ICD-10-CM

## 2018-05-20 DIAGNOSIS — R928 Other abnormal and inconclusive findings on diagnostic imaging of breast: Secondary | ICD-10-CM

## 2018-05-20 NOTE — Progress Notes (Signed)
Daily Session Note  Patient Details  Name: Denise Cuevas MRN: [id number] Date of Birth: 1951/02/24 Referring Provider:     [place] from 05/05/2018 in [place]  Referring Provider  Dr. Servando Snare      Encounter Date: 05/20/2018  Check In: Session Check In - 05/20/18 0930      Check-In   Location  [facility]    Staff Present  Russella Dar, MS, EP, [facility], Exercise Physiologist;Qusay Villada Wynetta Emery, RN, BSN;Gregory Cowan, BS, EP, Exercise Physiologist    Supervising physician immediately available to respond to emergencies  See telemetry face sheet for immediately available MD    Medication changes reported      No    Fall or balance concerns reported     Yes    Comments  Has fallen twice within 12 months.     Warm-up and Cool-down  Performed as group-led Higher education careers adviser Performed  Yes    VAD Patient?  No      Pain Assessment   Currently in Pain?  No/denies    Pain Score  0-No pain    Multiple Pain Sites  No       Capillary Blood Glucose: No results found for this or any previous visit (from the past 24 hour(s)).    Social History   Tobacco Use  Smoking Status Never Smoker  Smokeless Tobacco Never Used    Goals Met:  Independence with exercise equipment Exercise tolerated well No report of cardiac concerns or symptoms Strength training completed today  Goals Unmet:  Not Applicable  Comments: Check out 1030.   Dr. Kate Sable is Medical Director for [facility] Cardiac and Pulmonary Rehab.

## 2018-05-21 LAB — CYTOLOGY - PAP
DIAGNOSIS: NEGATIVE
HPV: NOT DETECTED

## 2018-05-21 NOTE — Progress Notes (Signed)
Cardiac Individual Treatment Plan  Patient Details  Name: Denise Cuevas MRN: [id number] Date of Birth: 1951/03/19 Referring Provider:     [facility] from 05/05/2018 in [place]  Referring Provider  Dr. Servando Snare      Initial Encounter Date:    CARDIAC REHAB PHASE II ORIENTATION from 05/05/2018 in [place]  Date  05/05/18  Referring Provider  Dr. Servando Snare      Visit Diagnosis: S/P AVR  S/P MVR (mitral valve replacement)  Patient's Home Medications on Admission:  Current Outpatient Medications:  .  albuterol (PROVENTIL HFA;VENTOLIN HFA) 108 (90 BASE) MCG/ACT inhaler, Inhale 2 puffs into the lungs every 6 (six) hours as needed for wheezing or shortness of breath., Disp: 1 Inhaler, Rfl: 2 .  albuterol (PROVENTIL) (2.5 MG/3ML) 0.083% nebulizer solution, Take 3 mLs (2.5 mg total) by nebulization every 2 (two) hours as needed for wheezing., Disp: 75 mL, Rfl: 0 .  DULoxetine (CYMBALTA) 30 MG capsule, Take 30 mg by mouth daily with lunch. In conjunction with one 60 mg capsule to equal a total of 90 milligrams, Disp: , Rfl:  .  DULoxetine (CYMBALTA) 60 MG capsule, Take 60 mg by mouth daily with lunch. In conjunction with one 30 mg capsule to equal a total of 90 milligrams, Disp: , Rfl:  .  ergocalciferol (VITAMIN D2) 50000 units capsule, Take 50,000 Units by mouth once a week. fridays, Disp: , Rfl:  .  furosemide (LASIX) 40 MG tablet, Take 40 mg by mouth daily., Disp: , Rfl:  .  HUMALOG MIX 75/25 KWIKPEN (75-25) 100 UNIT/ML Kwikpen, Inject 25 Units into the skin 2 (two) times daily., Disp: 15 mL, Rfl: 10 .  KRISTALOSE 20 g packet, Take 20 g by mouth 3 (three) times daily., Disp: , Rfl: 3 .  levothyroxine (SYNTHROID, LEVOTHROID) 50 MCG tablet, TAKE 1 TABLET(50 MCG) BY MOUTH DAILY BEFORE BREAKFAST, Disp: 30 tablet, Rfl: 11 .  mometasone-formoterol (DULERA) 100-5 MCG/ACT AERO, Inhale 2 puffs into the lungs 2 (two) times  daily as needed for wheezing or shortness of breath., Disp: , Rfl:  .  omeprazole-sodium bicarbonate (ZEGERID) 40-1100 MG per capsule, Take 1 capsule by mouth daily before breakfast., Disp: , Rfl:  .  spironolactone (ALDACTONE) 25 MG tablet, Take 1 tablet (25 mg total) by mouth daily., Disp: 30 tablet, Rfl: 0 .  XIFAXAN 550 MG TABS tablet, Take 550 mg by mouth 2 (two) times daily., Disp: , Rfl: 5  Past Medical History: Past Medical History:  Diagnosis Date  . Angina   . Asthma   . CHF (congestive heart failure) ([place])   . 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 COPD (chronic obstructive pulmonary disease) ([place])   . Coronary artery disease    s/p CABG  . Depression   . Diabetes mellitus    type 2  . Dyspnea   . Edema extremities    consistent edema lower legs, feet, and right quadrant abdominal pain-"goes and comes"  . Family history of adverse reaction to anesthesia    SISTER HAS NAUSEA  . Fibromyalgia   . GERD (gastroesophageal reflux disease)   . H/O hiatal hernia   . Headache(784.0)    occ. generalized  . Heart murmur   . Hypercholesteremia   . Hypertension   . Hypothyroid   . Myocardial infarction [facility]) 11/2011   MI x2- 0'30,0'92 coronary stent(chest pain episode at time)  . PONV (postoperative nausea and vomiting)  Tobacco Use: Social History   Tobacco Use  Smoking Status Never Smoker  Smokeless Tobacco Never Used    Labs: Recent Review Flowsheet Data    Labs for ITP Cardiac and Pulmonary Rehab Latest Ref Rng & Units 11/13/2017 11/13/2017 11/14/2017 11/15/2017 02/16/2018   Cholestrol 0 - 200 mg/dL - - - - -   LDLCALC 0 - 99 mg/dL - - - - -   HDL >40 mg/dL - - - - -   Trlycerides <150 mg/dL - - - - -   Hemoglobin A1c 4.8 - 5.6 % - - - - -   PHART 7.350 - 7.450 7.415 - - - 7.388   PCO2ART 32.0 - 48.0 mmHg 35.6 - - - 26.9(L)   HCO3 20.0 - 28.0 mmol/L 22.8 - - - 16.2(L)   TCO2 22 - 32 mmol/L 24 23 - - 17(L)   ACIDBASEDEF 0.0 - 2.0 mmol/L 1.0 - - -  8.0(H)   O2SAT % 97.0 - 75.7 67.5 96.0      Capillary Blood Glucose: Lab Results  Component Value Date   GLUCAP 165 (H) 02/20/2018   GLUCAP 235 (H) 02/19/2018   GLUCAP 224 (H) 02/19/2018   GLUCAP 350 (H) 02/19/2018   GLUCAP 180 (H) 02/19/2018     Exercise Target Goals:    Exercise Program Goal: Individual exercise prescription set using results from initial 6 min walk test and THRR while considering  patient's activity barriers and safety.   Exercise Prescription Goal: Starting with aerobic activity 30 plus minutes a day, 3 days per week for initial exercise prescription. Provide home exercise prescription and guidelines that participant acknowledges understanding prior to discharge.  Activity Barriers & Risk Stratification: Activity Barriers & Cardiac Risk Stratification - 05/05/18 1531      Activity Barriers & Cardiac Risk Stratification   Activity Barriers  Deconditioning;Balance Concerns;Muscular Weakness    Cardiac Risk Stratification  High       6 Minute Walk: 6 Minute Walk    Row Name 05/05/18 1531         6 Minute Walk   Phase  Initial     Distance  850 feet     Distance % Change  0 %     Distance Feet Change  0 ft     Walk Time  6 minutes     # of Rest Breaks  0     MPH  1.6     METS  2.23     RPE  15     Perceived Dyspnea   12     VO2 Peak  8.42     Symptoms  No     Resting HR  95 bpm     Resting BP  110/50     Resting Oxygen Saturation   100 %     Exercise Oxygen Saturation  during 6 min walk  81 %     Max Ex. HR  92 bpm     Max Ex. BP  140/60     2 Minute Post BP  120/56        Oxygen Initial Assessment:   Oxygen Re-Evaluation:   Oxygen Discharge (Final Oxygen Re-Evaluation):   Initial Exercise Prescription: Initial Exercise Prescription - 05/05/18 1500      Date of Initial Exercise RX and Referring Provider   Date  05/05/18    Referring Provider  Dr. Servando Snare      NuStep   Level  1    SPM  65    Minutes  15    METs  1.8       Arm Ergometer   Level  1    Watts  12    RPM  15    Minutes  20    METs  1.9      Prescription Details   Frequency (times per week)  3    Duration  Progress to 30 minutes of continuous aerobic without signs/symptoms of physical distress      Intensity   THRR 40-80% of Max Heartrate  [phone]    Ratings of Perceived Exertion  11-13    Perceived Dyspnea  0-4      Progression   Progression  Continue progressive overload as per policy without signs/symptoms or physical distress.      Resistance Training   Training Prescription  Yes    Weight  1    Reps  10-15       Perform Capillary Blood Glucose checks as needed.  Exercise Prescription Changes:  Exercise Prescription Changes    Row Name 05/08/18 1200             Response to Exercise   Blood Pressure (Admit)  130/62       Blood Pressure (Exercise)  120/70       Blood Pressure (Exit)  108/62       Heart Rate (Admit)  95 bpm       Heart Rate (Exercise)  101 bpm       Heart Rate (Exit)  100 bpm       Rating of Perceived Exertion (Exercise)  13       Duration  Progress to 30 minutes of  aerobic without signs/symptoms of physical distress       Intensity  THRR New [phone]         Progression   Progression  Continue to progress workloads to maintain intensity without signs/symptoms of physical distress.         Resistance Training   Training Prescription  Yes       Weight  1       Reps  10-15         NuStep   Level  1       SPM  57       Minutes  15       METs  1.6         Arm Ergometer   Level  1       Watts  11       RPM  15       Minutes  20       METs  1.8          Exercise Comments:  Exercise Comments    Row Name 05/08/18 1242           Exercise Comments  Patient has just started CR and will be propressed accoridngly with her exercise goals.           Exercise Goals and Review:  Exercise Goals    Row Name 05/05/18 1533             Exercise Goals   Increase Physical  Activity  Yes       Intervention  Provide advice, education, support and counseling about physical activity/exercise needs.;Develop an individualized exercise prescription for aerobic and resistive training based on initial evaluation findings, risk stratification, comorbidities and participant's personal goals.  Expected Outcomes  Short Term: Attend rehab on a regular basis to increase amount of physical activity.       Increase Strength and Stamina  Yes       Intervention  Provide advice, education, support and counseling about physical activity/exercise needs.;Develop an individualized exercise prescription for aerobic and resistive training based on initial evaluation findings, risk stratification, comorbidities and participant's personal goals.       Expected Outcomes  Short Term: Increase workloads from initial exercise prescription for resistance, speed, and METs.       Able to understand and use rate of perceived exertion (RPE) scale  Yes       Intervention  Provide education and explanation on how to use RPE scale       Expected Outcomes  Long Term:  Able to use RPE to guide intensity level when exercising independently;Short Term: Able to use RPE daily in rehab to express subjective intensity level       Able to understand and use Dyspnea scale  Yes       Intervention  Provide education and explanation on how to use Dyspnea scale       Expected Outcomes  Short Term: Able to use Dyspnea scale daily in rehab to express subjective sense of shortness of breath during exertion;Long Term: Able to use Dyspnea scale to guide intensity level when exercising independently       Knowledge and understanding of Target Heart Rate Range (THRR)  Yes       Intervention  Provide education and explanation of THRR including how the numbers were predicted and where they are located for reference       Expected Outcomes  Short Term: Able to state/look up THRR       Able to check pulse independently  Yes        Intervention  Provide education and demonstration on how to check pulse in carotid and radial arteries.;Review the importance of being able to check your own pulse for safety during independent exercise       Expected Outcomes  Short Term: Able to explain why pulse checking is important during independent exercise;Long Term: Able to check pulse independently and accurately       Understanding of Exercise Prescription  Yes       Intervention  Provide education, explanation, and written materials on patient's individual exercise prescription       Expected Outcomes  Long Term: Able to explain home exercise prescription to exercise independently;Short Term: Able to explain program exercise prescription          Exercise Goals Re-Evaluation : Exercise Goals Re-Evaluation    [place] Name 05/18/18 1503             Exercise Goal Re-Evaluation   Exercise Goals Review  Increase Physical Activity;Increase Strength and Stamina;Able to understand and use rate of perceived exertion (RPE) scale;Able to understand and use Dyspnea scale;Understanding of Exercise Prescription;Able to check pulse independently;Knowledge and understanding of Target Heart Rate Range (THRR)       Comments  Patient has only been to CR for 5 sessions. Patient will be progressed to meet her exercise related goals in time.        Expected Outcomes  Patient wishes to gain mobility and to be able to drive again.            Discharge Exercise Prescription (Final Exercise Prescription Changes): Exercise Prescription Changes - 05/08/18 1200      Response to  Exercise   Blood Pressure (Admit)  130/62    Blood Pressure (Exercise)  120/70    Blood Pressure (Exit)  108/62    Heart Rate (Admit)  95 bpm    Heart Rate (Exercise)  101 bpm    Heart Rate (Exit)  100 bpm    Rating of Perceived Exertion (Exercise)  13    Duration  Progress to 30 minutes of  aerobic without signs/symptoms of physical distress    Intensity  THRR New  [phone]      Progression   Progression  Continue to progress workloads to maintain intensity without signs/symptoms of physical distress.      Resistance Training   Training Prescription  Yes    Weight  1    Reps  10-15      NuStep   Level  1    SPM  57    Minutes  15    METs  1.6      Arm Ergometer   Level  1    Watts  11    RPM  15    Minutes  20    METs  1.8       Nutrition:  Target Goals: Understanding of nutrition guidelines, daily intake of sodium 1500mg , cholesterol 200mg , calories 30% from fat and 7% or less from saturated fats, daily to have 5 or more servings of fruits and vegetables.  Biometrics: Pre Biometrics - 05/05/18 1534      Pre Biometrics   Height  5\' 2"  (1.575 m)    Weight  142 lb 3.2 oz (64.5 kg)    Waist Circumference  34.5 inches    Hip Circumference  37.5 inches    Waist to Hip Ratio  0.92 %    BMI (Calculated)  26    Triceps Skinfold  10 mm    % Body Fat  32.6 %    Grip Strength  34.2 kg    Flexibility  12.5 in    Single Leg Stand  0 seconds Balance Concerns        Nutrition Therapy Plan and Nutrition Goals: Nutrition Therapy & Goals - 05/21/18 0836      Nutrition Therapy   RD appointment deferred  Yes      Personal Nutrition Goals   Personal Goal #2  Patient continues to eat a heart healthy along with low sodium diet.     Additional Goals?  No       Nutrition Assessments: Nutrition Assessments - 05/05/18 1624      MEDFICTS Scores   Pre Score  30       Nutrition Goals Re-Evaluation:   Nutrition Goals Discharge (Final Nutrition Goals Re-Evaluation):   Psychosocial: Target Goals: Acknowledge presence or absence of significant depression and/or stress, maximize coping skills, provide positive support system. Participant is able to verbalize types and ability to use techniques and skills needed for reducing stress and depression.  Initial Review & Psychosocial Screening: Initial Psych Review & Screening - 05/05/18  1627      Initial Review   Current issues with  Current Depression      Family Dynamics   Good Support System?  Yes      Barriers   Psychosocial barriers to participate in program  Psychosocial barriers identified (see note)      Screening Interventions   Interventions  Encouraged to exercise;To provide support and resources with identified psychosocial needs;Provide feedback about the scores to participant    Expected Outcomes  Short Term goal: Identification and review with participant of any Quality of Life or Depression concerns found by scoring the questionnaire.;Long Term goal: The participant improves quality of Life and PHQ9 Scores as seen by post scores and/or verbalization of changes       Quality of Life Scores: Quality of Life - 05/05/18 1535      Quality of Life Scores   Health/Function Pre  12.07 %    Socioeconomic Pre  27.38 %    Psych/Spiritual Pre  23.14 %    Family Pre  18 %    GLOBAL Pre  18.82 %      Scores of 19 and below usually indicate a poorer quality of life in these areas.  A difference of  2-3 points is a clinically meaningful difference.  A difference of 2-3 points in the total score of the Quality of Life Index has been associated with significant improvement in overall quality of life, self-image, physical symptoms, and general health in studies assessing change in quality of life.  PHQ-9: Recent Review Flowsheet Data    Depression screen [facility] 2/9 05/05/2018 10/29/2017 04/13/2012 01/27/2012   Decreased Interest 2 0 1 1   Down, Depressed, Hopeless 2 0 1 1   PHQ - 2 Score 4 0 2 2   Altered sleeping 2 - - -   Tired, decreased energy 2 - - -   Change in appetite 2 - - -   Feeling bad or failure about yourself  2 - - -   Trouble concentrating 1 - - -   Moving slowly or fidgety/restless 2 - - -   Suicidal thoughts 0 - - -   PHQ-9 Score 15 - - -   Difficult doing work/chores Very difficult - - -     Interpretation of Total Score  Total Score  Depression Severity:  1-4 = Minimal depression, 5-9 = Mild depression, 10-14 = Moderate depression, 15-19 = Moderately severe depression, 20-27 = Severe depression   Psychosocial Evaluation and Intervention: Psychosocial Evaluation - 05/05/18 1628      Psychosocial Evaluation & Interventions   Interventions  Encouraged to exercise with the program and follow exercise prescription;Relaxation education;Stress management education    Continue Psychosocial Services   Follow up required by staff       Psychosocial Re-Evaluation: Psychosocial Re-Evaluation    [place] Name 05/21/18 0911             Psychosocial Re-Evaluation   Current issues with  Current Depression;Current Stress Concerns       Comments  Patient's initial QOL score was 18.82 scoring lowest in health and function. Her PHQ-9 score was 15. She is currently being treated with Cymbalta 90 mg daily. She does not want to see a counselor but feels the Cymbalta is managing her depression.        Expected Outcomes  Patient will have improved QOL and PHQ-9 scores and no additional psychosocial barriers identified at discharge.        Interventions  Stress management education;Encouraged to attend Cardiac Rehabilitation for the exercise;Relaxation education       Comments  Patient's current stress concerns are her health and her lack of independence.          Initial Review   Source of Stress Concerns  Chronic Illness          Psychosocial Discharge (Final Psychosocial Re-Evaluation): Psychosocial Re-Evaluation - 05/21/18 0911      Psychosocial Re-Evaluation   Current issues with  Current Depression;Current Stress Concerns    Comments  Patient's initial QOL score was 18.82 scoring lowest in health and function. Her PHQ-9 score was 15. She is currently being treated with Cymbalta 90 mg daily. She does not want to see a counselor but feels the Cymbalta is managing her depression.     Expected Outcomes  Patient will have improved QOL  and PHQ-9 scores and no additional psychosocial barriers identified at discharge.     Interventions  Stress management education;Encouraged to attend Cardiac Rehabilitation for the exercise;Relaxation education    Comments  Patient's current stress concerns are her health and her lack of independence.       Initial Review   Source of Stress Concerns  Chronic Illness       Vocational Rehabilitation: Provide vocational rehab assistance to qualifying candidates.   Vocational Rehab Evaluation & Intervention: Vocational Rehab - 05/05/18 1612      Initial Vocational Rehab Evaluation & Intervention   Assessment shows need for Vocational Rehabilitation  No       Education: Education Goals: Education classes will be provided on a weekly basis, covering required topics. Participant will state understanding/return demonstration of topics presented.  Learning Barriers/Preferences: Learning Barriers/Preferences - 05/05/18 1610      Learning Barriers/Preferences   Learning Barriers  None    Learning Preferences  Written Material;Pictoral;Computer/Internet       Education Topics: Hypertension, Hypertension Reduction -Define heart disease and high blood pressure. Discus how high blood pressure affects the body and ways to reduce high blood pressure.   Exercise and Your Heart -Discuss why it is important to exercise, the FITT principles of exercise, normal and abnormal responses to exercise, and how to exercise safely.   Angina -Discuss definition of angina, causes of angina, treatment of angina, and how to decrease risk of having angina.   Cardiac Medications -Review what the following cardiac medications are used for, how they affect the body, and side effects that may occur when taking the medications.  Medications include Aspirin, Beta blockers, calcium channel blockers, ACE Inhibitors, angiotensin receptor blockers, diuretics, digoxin, and antihyperlipidemics.   Congestive Heart  Failure -Discuss the definition of CHF, how to live with CHF, the signs and symptoms of CHF, and how keep track of weight and sodium intake.   CARDIAC REHAB PHASE II EXERCISE from 05/13/2018 in [place]  Date  05/13/18  Educator  DC  Instruction Review Code  2- Demonstrated Understanding      Heart Disease and Intimacy -Discus the effect sexual activity has on the heart, how changes occur during intimacy as we age, and safety during sexual activity.   Smoking Cessation / COPD -Discuss different methods to quit smoking, the health benefits of quitting smoking, and the definition of COPD.   Nutrition I: Fats -Discuss the types of cholesterol, what cholesterol does to the heart, and how cholesterol levels can be controlled.   Nutrition II: Labels -Discuss the different components of food labels and how to read food label   Heart Parts/Heart Disease and PAD -Discuss the anatomy of the heart, the pathway of blood circulation through the heart, and these are affected by heart disease.   Stress I: Signs and Symptoms -Discuss the causes of stress, how stress may lead to anxiety and depression, and ways to limit stress.   Stress II: Relaxation -Discuss different types of relaxation techniques to limit stress.   Warning Signs of Stroke / TIA -Discuss definition of a stroke, what the  signs and symptoms are of a stroke, and how to identify when someone is having stroke.   Knowledge Questionnaire Score: Knowledge Questionnaire Score - 05/05/18 1610      Knowledge Questionnaire Score   Pre Score  22/24       Core Components/Risk Factors/Patient Goals at Admission: Personal Goals and Risk Factors at Admission - 05/05/18 1624      Core Components/Risk Factors/Patient Goals on Admission    Weight Management  Weight Maintenance    Diabetes  Yes    Intervention  Provide education about signs/symptoms and action to take for hypo/hyperglycemia.    Expected  Outcomes  Short Term: Participant verbalizes understanding of the signs/symptoms and immediate care of hyper/hypoglycemia, proper foot care and importance of medication, aerobic/resistive exercise and nutrition plan for blood glucose control.;Long Term: Attainment of HbA1C < 7%.    Stress  Yes    Intervention  Offer individual and/or small group education and counseling on adjustment to heart disease, stress management and health-related lifestyle change. Teach and support self-help strategies.    Expected Outcomes  Short Term: Participant demonstrates changes in health-related behavior, relaxation and other stress management skills, ability to obtain effective social support, and compliance with psychotropic medications if prescribed.;Long Term: Emotional wellbeing is indicated by absence of clinically significant psychosocial distress or social isolation.    Personal Goal Other  Yes    Personal Goal  Gain more mobility, Drive again, be more independent    Intervention  Attend CR 3 x week and supplement at home exercise 2 x week.     Expected Outcomes  Achieve personal goals.        Core Components/Risk Factors/Patient Goals Review:  Goals and Risk Factor Review    Row Name 05/21/18 0902             Core Components/Risk Factors/Patient Goals Review   Personal Goals Review  Diabetes;Stress;Weight Management/Obesity Increase mobility; drive again; more independent.        Review  Patient has completed 7 sessions gaining 7.5 lbs since she started the program. She has cirrohosis of the liver which she attribtues to her weight gain. She hopes to get a transplant. She is currently on the transplant list. She is doing well in the program with some progression. She says she does feel a little stronger since she started and hopes to continue to improve as she continues the program. She says her husband sees a difference in her appearance and her activity since she has been attending. Her last A1C was  11/10/17 was 7.5. Her fasting readings reported during sessions average 200. Will continue to monitor for progress.        Expected Outcomes  Patient will continue to attend sessions and complete the program meeting her personal needs.           Core Components/Risk Factors/Patient Goals at Discharge (Final Review):  Goals and Risk Factor Review - 05/21/18 0902      Core Components/Risk Factors/Patient Goals Review   Personal Goals Review  Diabetes;Stress;Weight Management/Obesity Increase mobility; drive again; more independent.     Review  Patient has completed 7 sessions gaining 7.5 lbs since she started the program. She has cirrohosis of the liver which she attribtues to her weight gain. She hopes to get a transplant. She is currently on the transplant list. She is doing well in the program with some progression. She says she does feel a little stronger since she started and hopes to continue to  improve as she continues the program. She says her husband sees a difference in her appearance and her activity since she has been attending. Her last A1C was 11/10/17 was 7.5. Her fasting readings reported during sessions average 200. Will continue to monitor for progress.     Expected Outcomes  Patient will continue to attend sessions and complete the program meeting her personal needs.        ITP Comments: ITP Comments    Row Name 05/05/18 1613           ITP Comments  Mrs. Blaydes is a pleasant 67 year old patient. She has been through the program in 2012 due to previous heart events. She has cirrhosis of liver non alcohol induced. She has poor balance. She is very egaer to get started to regain her strength.           Comments: ITP 30 Day REVIEW Patient doing well in the program. Will continue to monitor for progress.

## 2018-05-22 ENCOUNTER — Encounter (HOSPITAL_COMMUNITY)
Admission: RE | Admit: 2018-05-22 | Discharge: 2018-05-22 | Disposition: A | Discharge status: Home / Self Care | Payer: Medicare Other | Source: Ambulatory Visit | Attending: Cardiology | Admitting: Cardiology

## 2018-05-22 DIAGNOSIS — Z953 Presence of xenogenic heart valve: Secondary | ICD-10-CM | POA: Diagnosis not present

## 2018-05-22 DIAGNOSIS — Z952 Presence of prosthetic heart valve: Secondary | ICD-10-CM

## 2018-05-22 NOTE — Progress Notes (Signed)
Daily Session Note  Patient Details  Name: Denise Cuevas MRN: [id number] Date of Birth: January 29, 1951 Referring Provider:     [place] from 05/05/2018 in [place]  Referring Provider  Dr. Servando Snare      Encounter Date: 05/22/2018  Check In: Session Check In - 05/22/18 1537      Check-In   Location  [facility]    Staff Present  Diane Angelina Pih, MS, EP, [facility], Exercise Physiologist;Argus Caraher Wynetta Emery, RN, BSN;Gregory Cowan, BS, EP, Exercise Physiologist    Supervising physician immediately available to respond to emergencies  See telemetry face sheet for immediately available MD    Medication changes reported      No    Fall or balance concerns reported     Yes    Comments  Has fallen twice within 12 months.     Warm-up and Cool-down  Performed as group-led Higher education careers adviser Performed  Yes    VAD Patient?  No      Pain Assessment   Currently in Pain?  No/denies    Pain Score  0-No pain    Multiple Pain Sites  No       Capillary Blood Glucose: No results found for this or any previous visit (from the past 24 hour(s)).    Social History   Tobacco Use  Smoking Status Never Smoker  Smokeless Tobacco Never Used    Goals Met:  Independence with exercise equipment Exercise tolerated well No report of cardiac concerns or symptoms Strength training completed today  Goals Unmet:  Not Applicable  Comments: Check out 1645.   Dr. Kate Sable is Medical Director for [facility] Cardiac and Pulmonary Rehab.

## 2018-05-25 ENCOUNTER — Encounter (HOSPITAL_COMMUNITY)
Admission: RE | Admit: 2018-05-25 | Discharge: 2018-05-25 | Disposition: A | Discharge status: Home / Self Care | Payer: Medicare Other | Source: Ambulatory Visit | Attending: Cardiology | Admitting: Cardiology

## 2018-05-25 DIAGNOSIS — Z953 Presence of xenogenic heart valve: Secondary | ICD-10-CM | POA: Diagnosis not present

## 2018-05-25 DIAGNOSIS — Z952 Presence of prosthetic heart valve: Secondary | ICD-10-CM

## 2018-05-25 NOTE — Progress Notes (Signed)
Daily Session Note  Patient Details  Name: Denise Cuevas MRN: [id number] Date of Birth: 11/17/1951 Referring Provider:     [place] from 05/05/2018 in [place]  Referring Provider  Dr. Servando Snare      Encounter Date: 05/25/2018  Check In: Session Check In - 05/25/18 0930      Check-In   Location  [facility]    Staff Present  Russella Dar, MS, EP, [facility], Exercise Physiologist;Rosabel Sermeno Wynetta Emery, RN, BSN;Gregory Cowan, BS, EP, Exercise Physiologist    Supervising physician immediately available to respond to emergencies  See telemetry face sheet for immediately available MD    Medication changes reported      No    Fall or balance concerns reported     Yes    Comments  Has fallen twice within 12 months.     Warm-up and Cool-down  Performed as group-led Higher education careers adviser Performed  Yes    VAD Patient?  No      Pain Assessment   Currently in Pain?  No/denies    Pain Score  0-No pain    Multiple Pain Sites  No       Capillary Blood Glucose: No results found for this or any previous visit (from the past 24 hour(s)).    Social History   Tobacco Use  Smoking Status Never Smoker  Smokeless Tobacco Never Used    Goals Met:  Independence with exercise equipment Exercise tolerated well No report of cardiac concerns or symptoms Strength training completed today  Goals Unmet:  Not Applicable  Comments: Check out 1030.   Dr. Kate Sable is Medical Director for [facility] Cardiac and Pulmonary Rehab.

## 2018-05-26 ENCOUNTER — Ambulatory Visit
Admission: RE | Admit: 2018-05-26 | Discharge: 2018-05-26 | Disposition: A | Discharge status: Home / Self Care | Payer: Medicare Other | Source: Ambulatory Visit | Attending: Family Medicine | Admitting: Family Medicine

## 2018-05-26 ENCOUNTER — Other Ambulatory Visit: Payer: Self-pay | Admitting: Family Medicine

## 2018-05-26 DIAGNOSIS — R928 Other abnormal and inconclusive findings on diagnostic imaging of breast: Secondary | ICD-10-CM

## 2018-05-27 ENCOUNTER — Encounter (HOSPITAL_COMMUNITY)
Admission: RE | Admit: 2018-05-27 | Discharge: 2018-05-27 | Disposition: A | Discharge status: Home / Self Care | Payer: Medicare Other | Source: Ambulatory Visit | Attending: Cardiology | Admitting: Cardiology

## 2018-05-27 DIAGNOSIS — Z953 Presence of xenogenic heart valve: Secondary | ICD-10-CM | POA: Diagnosis not present

## 2018-05-27 DIAGNOSIS — Z952 Presence of prosthetic heart valve: Secondary | ICD-10-CM

## 2018-05-27 NOTE — Progress Notes (Signed)
Daily Session Note  Patient Details  Name: Denise Cuevas MRN: [id number] Date of Birth: 05/09/1951 Referring Provider:     CARDIAC REHAB PHASE II ORIENTATION from 05/05/2018 in [place]  Referring Provider  Dr. Servando Snare      Encounter Date: 05/27/2018  Check In: Session Check In - 05/27/18 0946      Check-In   Location  [facility]    Staff Present  Russella Dar, MS, EP, [facility], Exercise Physiologist;Debra Wynetta Emery, RN, BSN;Chistopher Mangino, BS, EP, Exercise Physiologist    Supervising physician immediately available to respond to emergencies  See telemetry face sheet for immediately available MD    Medication changes reported      No    Fall or balance concerns reported     Yes    Warm-up and Cool-down  Performed as group-led instruction    Resistance Training Performed  Yes    VAD Patient?  No      Pain Assessment   Currently in Pain?  No/denies    Pain Score  0-No pain    Multiple Pain Sites  No       Capillary Blood Glucose: No results found for this or any previous visit (from the past 24 hour(s)).    Social History   Tobacco Use  Smoking Status Never Smoker  Smokeless Tobacco Never Used    Goals Met:  Independence with exercise equipment Exercise tolerated well No report of cardiac concerns or symptoms Strength training completed today  Goals Unmet:  Not Applicable  Comments: Check out 1030   Dr. Kate Sable is Medical Director for [place] and Pulmonary Rehab.

## 2018-05-29 ENCOUNTER — Ambulatory Visit
Admission: RE | Admit: 2018-05-29 | Discharge: 2018-05-29 | Disposition: A | Discharge status: Home / Self Care | Payer: Medicare Other | Source: Ambulatory Visit | Attending: Family Medicine | Admitting: Family Medicine

## 2018-05-29 ENCOUNTER — Encounter (HOSPITAL_COMMUNITY)
Admission: RE | Admit: 2018-05-29 | Discharge: 2018-05-29 | Disposition: A | Discharge status: Home / Self Care | Payer: Medicare Other | Source: Ambulatory Visit | Attending: Cardiology | Admitting: Cardiology

## 2018-05-29 DIAGNOSIS — R928 Other abnormal and inconclusive findings on diagnostic imaging of breast: Secondary | ICD-10-CM

## 2018-06-01 ENCOUNTER — Encounter (HOSPITAL_COMMUNITY)
Admission: RE | Admit: 2018-06-01 | Discharge: 2018-06-01 | Disposition: A | Discharge status: Home / Self Care | Payer: Medicare Other | Source: Ambulatory Visit | Attending: Cardiology | Admitting: Cardiology

## 2018-06-01 DIAGNOSIS — Z952 Presence of prosthetic heart valve: Secondary | ICD-10-CM

## 2018-06-01 DIAGNOSIS — Z953 Presence of xenogenic heart valve: Secondary | ICD-10-CM | POA: Diagnosis not present

## 2018-06-01 NOTE — Progress Notes (Signed)
Daily Session Note  Patient Details  Name: Denise Cuevas MRN: [id number] Date of Birth: [birth year] Referring Provider:     [place] from 05/05/2018 in [place]  Referring Provider  Dr. Servando Snare      Encounter Date: 06/01/2018  Check In: Session Check In - 06/01/18 0930      Check-In   Location  [facility]    Staff Present  Diane Angelina Pih, MS, EP, [facility], Exercise Physiologist;Debra Wynetta Emery, RN, BSN;Rashee Marschall, BS, EP, Exercise Physiologist    Supervising physician immediately available to respond to emergencies  See telemetry face sheet for immediately available MD    Medication changes reported      No    Fall or balance concerns reported     Yes    Comments  Has fallen twice within 12 months.     Warm-up and Cool-down  Performed as group-led Higher education careers adviser Performed  Yes    VAD Patient?  No      Pain Assessment   Currently in Pain?  No/denies    Pain Score  0-No pain    Multiple Pain Sites  No       Capillary Blood Glucose: No results found for this or any previous visit (from the past 24 hour(s)).    Social History   Tobacco Use  Smoking Status Never Smoker  Smokeless Tobacco Never Used    Goals Met:  Independence with exercise equipment Exercise tolerated well No report of cardiac concerns or symptoms Strength training completed today  Goals Unmet:  Not Applicable  Comments: Check out 1030   Dr. Kate Sable is Medical Director for [place] and Pulmonary Rehab.

## 2018-06-03 ENCOUNTER — Encounter (HOSPITAL_COMMUNITY)
Admission: RE | Admit: 2018-06-03 | Discharge: 2018-06-03 | Disposition: A | Discharge status: Home / Self Care | Payer: Medicare Other | Source: Ambulatory Visit | Attending: Cardiology | Admitting: Cardiology

## 2018-06-03 DIAGNOSIS — Z953 Presence of xenogenic heart valve: Secondary | ICD-10-CM | POA: Diagnosis not present

## 2018-06-03 DIAGNOSIS — Z952 Presence of prosthetic heart valve: Secondary | ICD-10-CM

## 2018-06-03 NOTE — Progress Notes (Signed)
Daily Session Note  Patient Details  Name: Denise Cuevas MRN: [id number] Date of Birth: 1951/06/17 Referring Provider:     [place] from 05/05/2018 in [place]  Referring Provider  Dr. Servando Snare      Encounter Date: 06/03/2018  Check In: Session Check In - 06/03/18 0930      Check-In   Location  [facility]    Staff Present  Diane Angelina Pih, MS, EP, [facility], Exercise Physiologist;Debra Wynetta Emery, RN, BSN;Koula Venier, BS, EP, Exercise Physiologist    Supervising physician immediately available to respond to emergencies  See telemetry face sheet for immediately available MD    Medication changes reported      No    Fall or balance concerns reported     Yes    Comments  Has fallen twice within 12 months.     Warm-up and Cool-down  Performed as group-led Higher education careers adviser Performed  Yes    VAD Patient?  No    PAD/SET Patient?  No      Pain Assessment   Currently in Pain?  No/denies    Pain Score  0-No pain    Multiple Pain Sites  No       Capillary Blood Glucose: No results found for this or any previous visit (from the past 24 hour(s)).    Social History   Tobacco Use  Smoking Status Never Smoker  Smokeless Tobacco Never Used    Goals Met:  Independence with exercise equipment Exercise tolerated well No report of cardiac concerns or symptoms Strength training completed today  Goals Unmet:  Not Applicable  Comments: Check out 1030   Dr. Kate Sable is Medical Director for [place] and Pulmonary Rehab.

## 2018-06-04 ENCOUNTER — Ambulatory Visit (HOSPITAL_COMMUNITY): Payer: Medicare Other

## 2018-06-05 ENCOUNTER — Encounter (HOSPITAL_COMMUNITY)
Admission: RE | Admit: 2018-06-05 | Discharge: 2018-06-05 | Disposition: A | Discharge status: Home / Self Care | Payer: Medicare Other | Source: Ambulatory Visit | Attending: Cardiology | Admitting: Cardiology

## 2018-06-05 DIAGNOSIS — Z953 Presence of xenogenic heart valve: Secondary | ICD-10-CM | POA: Diagnosis not present

## 2018-06-05 DIAGNOSIS — Z952 Presence of prosthetic heart valve: Secondary | ICD-10-CM

## 2018-06-05 NOTE — Progress Notes (Signed)
Daily Session Note  Patient Details  Name: EMME ROSENAU MRN: [id number] Date of Birth: 03/02/1951 Referring Provider:     [place] from 05/05/2018 in [place]  Referring Provider  Dr. Servando Snare      Encounter Date: 06/05/2018  Check In: Session Check In - 06/05/18 0930      Check-In   Location  [facility]    Staff Present  Aundra Dubin, RN, BSN;Reighlyn Elmes Luther Parody, BS, EP, Exercise Physiologist    Supervising physician immediately available to respond to emergencies  See telemetry face sheet for immediately available MD    Medication changes reported      No    Fall or balance concerns reported     Yes    Comments  Has fallen twice within 12 months.     Warm-up and Cool-down  Performed as group-led Higher education careers adviser Performed  Yes    VAD Patient?  No    PAD/SET Patient?  No      Pain Assessment   Currently in Pain?  No/denies    Pain Score  0-No pain    Multiple Pain Sites  No       Capillary Blood Glucose: No results found for this or any previous visit (from the past 24 hour(s)).    Social History   Tobacco Use  Smoking Status Never Smoker  Smokeless Tobacco Never Used    Goals Met:  Independence with exercise equipment Exercise tolerated well No report of cardiac concerns or symptoms Strength training completed today  Goals Unmet:  Not Applicable  Comments: Check out 1030   Dr. Kate Sable is Medical Director for [place] and Pulmonary Rehab.

## 2018-06-08 ENCOUNTER — Encounter (HOSPITAL_COMMUNITY)
Admission: RE | Admit: 2018-06-08 | Discharge: 2018-06-08 | Disposition: A | Discharge status: Home / Self Care | Payer: Medicare Other | Source: Ambulatory Visit | Attending: Cardiology | Admitting: Cardiology

## 2018-06-08 ENCOUNTER — Ambulatory Visit (HOSPITAL_COMMUNITY): Payer: Medicare Other

## 2018-06-08 DIAGNOSIS — E785 Hyperlipidemia, unspecified: Secondary | ICD-10-CM | POA: Diagnosis not present

## 2018-06-08 DIAGNOSIS — F329 Major depressive disorder, single episode, unspecified: Secondary | ICD-10-CM | POA: Diagnosis not present

## 2018-06-08 DIAGNOSIS — Z79890 Hormone replacement therapy (postmenopausal): Secondary | ICD-10-CM | POA: Diagnosis not present

## 2018-06-08 DIAGNOSIS — I509 Heart failure, unspecified: Secondary | ICD-10-CM | POA: Insufficient documentation

## 2018-06-08 DIAGNOSIS — K219 Gastro-esophageal reflux disease without esophagitis: Secondary | ICD-10-CM | POA: Insufficient documentation

## 2018-06-08 DIAGNOSIS — Z951 Presence of aortocoronary bypass graft: Secondary | ICD-10-CM | POA: Diagnosis not present

## 2018-06-08 DIAGNOSIS — I252 Old myocardial infarction: Secondary | ICD-10-CM | POA: Diagnosis not present

## 2018-06-08 DIAGNOSIS — Z952 Presence of prosthetic heart valve: Secondary | ICD-10-CM

## 2018-06-08 DIAGNOSIS — Z79899 Other long term (current) drug therapy: Secondary | ICD-10-CM | POA: Diagnosis not present

## 2018-06-08 DIAGNOSIS — E119 Type 2 diabetes mellitus without complications: Secondary | ICD-10-CM | POA: Diagnosis not present

## 2018-06-08 DIAGNOSIS — I110 Hypertensive heart disease with heart failure: Secondary | ICD-10-CM | POA: Insufficient documentation

## 2018-06-08 DIAGNOSIS — I2510 Atherosclerotic heart disease of native coronary artery without angina pectoris: Secondary | ICD-10-CM | POA: Insufficient documentation

## 2018-06-08 DIAGNOSIS — J449 Chronic obstructive pulmonary disease, unspecified: Secondary | ICD-10-CM | POA: Diagnosis not present

## 2018-06-08 DIAGNOSIS — E039 Hypothyroidism, unspecified: Secondary | ICD-10-CM | POA: Insufficient documentation

## 2018-06-08 DIAGNOSIS — M797 Fibromyalgia: Secondary | ICD-10-CM | POA: Diagnosis not present

## 2018-06-08 DIAGNOSIS — Z953 Presence of xenogenic heart valve: Secondary | ICD-10-CM | POA: Insufficient documentation

## 2018-06-08 NOTE — Progress Notes (Signed)
Daily Session Note  Patient Details  Name: MCKAYLIN BASTIEN MRN: [id number] Date of Birth: [birth year] Referring Provider:     CARDIAC REHAB PHASE II ORIENTATION from 05/05/2018 in [place]  Referring Provider  Dr. Servando Snare      Encounter Date: 06/08/2018  Check In: Session Check In - 06/08/18 0930      Check-In   Location  [facility]    Staff Present  Aundra Dubin, RN, BSN;Nikaela Coyne, BS, EP, Exercise Physiologist;Diane Coad, MS, EP, [facility], Exercise Physiologist    Supervising physician immediately available to respond to emergencies  See telemetry face sheet for immediately available MD    Medication changes reported      No    Fall or balance concerns reported     Yes    Comments  Has fallen twice within 12 months.     Warm-up and Cool-down  Performed as group-led Higher education careers adviser Performed  Yes    VAD Patient?  No    PAD/SET Patient?  No      Pain Assessment   Currently in Pain?  No/denies    Pain Score  0-No pain    Multiple Pain Sites  No       Capillary Blood Glucose: No results found for this or any previous visit (from the past 24 hour(s)).    Social History   Tobacco Use  Smoking Status Never Smoker  Smokeless Tobacco Never Used    Goals Met:  Independence with exercise equipment Exercise tolerated well No report of cardiac concerns or symptoms Strength training completed today  Goals Unmet:  Not Applicable  Comments: Check out 1030   Dr. Kate Sable is Medical Director for [place] and Pulmonary Rehab.

## 2018-06-10 ENCOUNTER — Encounter (HOSPITAL_COMMUNITY)
Admission: RE | Admit: 2018-06-10 | Discharge: 2018-06-10 | Disposition: A | Discharge status: Home / Self Care | Payer: Medicare Other | Source: Ambulatory Visit | Attending: Cardiology | Admitting: Cardiology

## 2018-06-10 ENCOUNTER — Ambulatory Visit (HOSPITAL_COMMUNITY): Payer: Medicare Other

## 2018-06-10 DIAGNOSIS — Z953 Presence of xenogenic heart valve: Secondary | ICD-10-CM | POA: Diagnosis not present

## 2018-06-10 DIAGNOSIS — Z952 Presence of prosthetic heart valve: Secondary | ICD-10-CM

## 2018-06-10 LAB — GLUCOSE, CAPILLARY: Glucose-Capillary: 147 mg/dL — ABNORMAL HIGH (ref 70–99)

## 2018-06-10 NOTE — Progress Notes (Signed)
Daily Session Note  Patient Details  Name: Denise Cuevas MRN: [id number] Date of Birth: 08/21/1951 Referring Provider:     CARDIAC REHAB PHASE II ORIENTATION from 05/05/2018 in [place] CARDIAC REHABILITATION  Referring Provider  Dr. Gerhardt      Encounter Date: 06/10/2018  Check In: Session Check In - 06/10/18 0930      Check-In   Location  [facility]    Staff Present  Debra Johnson, RN, BSN;Sherlon Nied, BS, EP, Exercise Physiologist;Diane Coad, MS, EP, [facility], Exercise Physiologist    Supervising physician immediately available to respond to emergencies  See telemetry face sheet for immediately available MD    Medication changes reported      No    Fall or balance concerns reported     Yes    Comments  Has fallen twice within 12 months.     Warm-up and Cool-down  Performed as group-led instruction    Resistance Training Performed  Yes    VAD Patient?  No    PAD/SET Patient?  No      Pain Assessment   Currently in Pain?  No/denies    Pain Score  0-No pain    Multiple Pain Sites  No       Capillary Blood Glucose: No results found for this or any previous visit (from the past 24 hour(s)).    Social History   Tobacco Use  Smoking Status Never Smoker  Smokeless Tobacco Never Used    Goals Met:  Independence with exercise equipment Exercise tolerated well No report of cardiac concerns or symptoms Strength training completed today  Goals Unmet:  Not Applicable  Comments: Check out 1030   Dr. Suresh Koneswaran is Medical Director for [place] Cardiac and Pulmonary Rehab. 

## 2018-06-12 ENCOUNTER — Encounter (HOSPITAL_COMMUNITY)
Admission: RE | Admit: 2018-06-12 | Discharge: 2018-06-12 | Disposition: A | Discharge status: Home / Self Care | Payer: Medicare Other | Source: Ambulatory Visit | Attending: Cardiology | Admitting: Cardiology

## 2018-06-12 ENCOUNTER — Ambulatory Visit (HOSPITAL_COMMUNITY): Payer: Medicare Other

## 2018-06-12 DIAGNOSIS — Z953 Presence of xenogenic heart valve: Secondary | ICD-10-CM | POA: Diagnosis not present

## 2018-06-12 DIAGNOSIS — Z952 Presence of prosthetic heart valve: Secondary | ICD-10-CM

## 2018-06-12 NOTE — Progress Notes (Signed)
Daily Session Note  Patient Details  Name: Denise Cuevas MRN: [id number] Date of Birth: [birth year] Referring Provider:     CARDIAC REHAB PHASE II ORIENTATION from 05/05/2018 in [place]  Referring Provider  Dr. Servando Snare      Encounter Date: 06/12/2018  Check In: Session Check In - 06/12/18 0930      Check-In   Location  [facility]    Staff Present  Aundra Dubin, RN, BSN;Diesha Rostad, BS, EP, Exercise Physiologist;Diane Coad, MS, EP, [facility], Exercise Physiologist    Supervising physician immediately available to respond to emergencies  See telemetry face sheet for immediately available MD    Medication changes reported      No    Fall or balance concerns reported     Yes    Comments  Has fallen twice within 12 months.     Warm-up and Cool-down  Performed as group-led Higher education careers adviser Performed  Yes    VAD Patient?  No      Pain Assessment   Currently in Pain?  No/denies    Pain Score  0-No pain    Multiple Pain Sites  No       Capillary Blood Glucose: No results found for this or any previous visit (from the past 24 hour(s)).    Social History   Tobacco Use  Smoking Status Never Smoker  Smokeless Tobacco Never Used    Goals Met:  Independence with exercise equipment Exercise tolerated well No report of cardiac concerns or symptoms Strength training completed today  Goals Unmet:  Not Applicable  Comments: Check out 1030   Dr. Kate Sable is Medical Director for [place] and Pulmonary Rehab.

## 2018-06-15 ENCOUNTER — Encounter (HOSPITAL_COMMUNITY)
Admission: RE | Admit: 2018-06-15 | Discharge: 2018-06-15 | Disposition: A | Discharge status: Home / Self Care | Payer: Medicare Other | Source: Ambulatory Visit | Attending: Cardiology | Admitting: Cardiology

## 2018-06-15 ENCOUNTER — Ambulatory Visit (HOSPITAL_COMMUNITY): Payer: Medicare Other

## 2018-06-15 DIAGNOSIS — Z952 Presence of prosthetic heart valve: Secondary | ICD-10-CM

## 2018-06-15 DIAGNOSIS — Z953 Presence of xenogenic heart valve: Secondary | ICD-10-CM | POA: Diagnosis not present

## 2018-06-15 NOTE — Progress Notes (Signed)
Daily Session Note  Patient Details  Name: Denise Cuevas MRN: [id number] Date of Birth: 11/26/1951 Referring Provider:     CARDIAC REHAB PHASE II ORIENTATION from 05/05/2018 in [place]  Referring Provider  Dr. Servando Snare      Encounter Date: 06/15/2018  Check In: Session Check In - 06/15/18 0930      Check-In   Location  [facility]    Staff Present  Aundra Dubin, RN, BSN;Gregory Cowan, BS, EP, Exercise Physiologist;Diane Coad, MS, EP, [facility], Exercise Physiologist    Supervising physician immediately available to respond to emergencies  See telemetry face sheet for immediately available MD    Medication changes reported      No    Fall or balance concerns reported     Yes    Comments  Has fallen twice within 12 months.     Warm-up and Cool-down  Performed as group-led Higher education careers adviser Performed  Yes    VAD Patient?  No    PAD/SET Patient?  No      Pain Assessment   Currently in Pain?  No/denies    Pain Score  0-No pain    Multiple Pain Sites  No       Capillary Blood Glucose: No results found for this or any previous visit (from the past 24 hour(s)).    Social History   Tobacco Use  Smoking Status Never Smoker  Smokeless Tobacco Never Used    Goals Met:  Independence with exercise equipment Exercise tolerated well No report of cardiac concerns or symptoms Strength training completed today  Goals Unmet:  Not Applicable  Comments: Check out 1030.   Dr. Kate Sable is Medical Director for [facility] Cardiac and Pulmonary Rehab.

## 2018-06-15 NOTE — Progress Notes (Signed)
Cardiac Individual Treatment Plan  Patient Details  Name: FUSAYE WACHTEL MRN: [id number] Date of Birth: 10-Mar-1951 Referring Provider:     [facility] from 05/05/2018 in [place]  Referring Provider  Dr. Servando Snare      Initial Encounter Date:    CARDIAC REHAB PHASE II ORIENTATION from 05/05/2018 in [facility]  Date  05/05/18      Visit Diagnosis: S/P AVR  S/P MVR (mitral valve replacement)  Patient's Home Medications on Admission:  Current Outpatient Medications:  .  albuterol (PROVENTIL HFA;VENTOLIN HFA) 108 (90 BASE) MCG/ACT inhaler, Inhale 2 puffs into the lungs every 6 (six) hours as needed for wheezing or shortness of breath., Disp: 1 Inhaler, Rfl: 2 .  albuterol (PROVENTIL) (2.5 MG/3ML) 0.083% nebulizer solution, Take 3 mLs (2.5 mg total) by nebulization every 2 (two) hours as needed for wheezing., Disp: 75 mL, Rfl: 0 .  DULoxetine (CYMBALTA) 30 MG capsule, Take 30 mg by mouth daily with lunch. In conjunction with one 60 mg capsule to equal a total of 90 milligrams, Disp: , Rfl:  .  DULoxetine (CYMBALTA) 60 MG capsule, Take 60 mg by mouth daily with lunch. In conjunction with one 30 mg capsule to equal a total of 90 milligrams, Disp: , Rfl:  .  ergocalciferol (VITAMIN D2) 50000 units capsule, Take 50,000 Units by mouth once a week. fridays, Disp: , Rfl:  .  furosemide (LASIX) 40 MG tablet, Take 40 mg by mouth daily., Disp: , Rfl:  .  HUMALOG MIX 75/25 KWIKPEN (75-25) 100 UNIT/ML Kwikpen, Inject 25 Units into the skin 2 (two) times daily., Disp: 15 mL, Rfl: 10 .  KRISTALOSE 20 g packet, Take 20 g by mouth 3 (three) times daily., Disp: , Rfl: 3 .  levothyroxine (SYNTHROID, LEVOTHROID) 50 MCG tablet, TAKE 1 TABLET(50 MCG) BY MOUTH DAILY BEFORE BREAKFAST, Disp: 30 tablet, Rfl: 11 .  mometasone-formoterol (DULERA) 100-5 MCG/ACT AERO, Inhale 2 puffs into the lungs 2 (two) times daily as needed for wheezing or  shortness of breath., Disp: , Rfl:  .  omeprazole-sodium bicarbonate (ZEGERID) 40-1100 MG per capsule, Take 1 capsule by mouth daily before breakfast., Disp: , Rfl:  .  spironolactone (ALDACTONE) 25 MG tablet, Take 1 tablet (25 mg total) by mouth daily., Disp: 30 tablet, Rfl: 0 .  XIFAXAN 550 MG TABS tablet, Take 550 mg by mouth 2 (two) times daily., Disp: , Rfl: 5  Past Medical History: Past Medical History:  Diagnosis Date  . Angina   . Asthma   . CHF (congestive heart failure) ([place])   . 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 COPD (chronic obstructive pulmonary disease) ([place])   . Coronary artery disease    s/p CABG  . Depression   . Diabetes mellitus    type 2  . Dyspnea   . Edema extremities    consistent edema lower legs, feet, and right quadrant abdominal pain-"goes and comes"  . Family history of adverse reaction to anesthesia    SISTER HAS NAUSEA  . Fibromyalgia   . GERD (gastroesophageal reflux disease)   . H/O hiatal hernia   . Headache(784.0)    occ. generalized  . Heart murmur   . Hypercholesteremia   . Hypertension   . Hypothyroid   . Myocardial infarction [facility]) 11/2011   MI x2- 8'36,6'29 coronary stent(chest pain episode at time)  . PONV (postoperative nausea and vomiting)     Tobacco Use: Social  History   Tobacco Use  Smoking Status Never Smoker  Smokeless Tobacco Never Used    Labs: Recent Review Flowsheet Data    Labs for ITP Cardiac and Pulmonary Rehab Latest Ref Rng & Units 11/13/2017 11/13/2017 11/14/2017 11/15/2017 02/16/2018   Cholestrol 0 - 200 mg/dL - - - - -   LDLCALC 0 - 99 mg/dL - - - - -   HDL >40 mg/dL - - - - -   Trlycerides <150 mg/dL - - - - -   Hemoglobin A1c 4.8 - 5.6 % - - - - -   PHART 7.350 - 7.450 7.415 - - - 7.388   PCO2ART 32.0 - 48.0 mmHg 35.6 - - - 26.9(L)   HCO3 20.0 - 28.0 mmol/L 22.8 - - - 16.2(L)   TCO2 22 - 32 mmol/L 24 23 - - 17(L)   ACIDBASEDEF 0.0 - 2.0 mmol/L 1.0 - - - 8.0(H)   O2SAT % 97.0 - 75.7 67.5  96.0      Capillary Blood Glucose: Lab Results  Component Value Date   GLUCAP 147 (H) 06/10/2018   GLUCAP 165 (H) 02/20/2018   GLUCAP 235 (H) 02/19/2018   GLUCAP 224 (H) 02/19/2018   GLUCAP 350 (H) 02/19/2018     Exercise Target Goals:    Exercise Program Goal: Individual exercise prescription set using results from initial 6 min walk test and THRR while considering  patient's activity barriers and safety.   Exercise Prescription Goal: Starting with aerobic activity 30 plus minutes a day, 3 days per week for initial exercise prescription. Provide home exercise prescription and guidelines that participant acknowledges understanding prior to discharge.  Activity Barriers & Risk Stratification: Activity Barriers & Cardiac Risk Stratification - 05/05/18 1531      Activity Barriers & Cardiac Risk Stratification   Activity Barriers  Deconditioning;Balance Concerns;Muscular Weakness    Cardiac Risk Stratification  High       6 Minute Walk: 6 Minute Walk    Row Name 05/05/18 1531         6 Minute Walk   Phase  Initial     Distance  850 feet     Distance % Change  0 %     Distance Feet Change  0 ft     Walk Time  6 minutes     # of Rest Breaks  0     MPH  1.6     METS  2.23     RPE  15     Perceived Dyspnea   12     VO2 Peak  8.42     Symptoms  No     Resting HR  95 bpm     Resting BP  110/50     Resting Oxygen Saturation   100 %     Exercise Oxygen Saturation  during 6 min walk  81 %     Max Ex. HR  92 bpm     Max Ex. BP  140/60     2 Minute Post BP  120/56        Oxygen Initial Assessment:   Oxygen Re-Evaluation:   Oxygen Discharge (Final Oxygen Re-Evaluation):   Initial Exercise Prescription: Initial Exercise Prescription - 05/05/18 1500      Date of Initial Exercise RX and Referring Provider   Date  05/05/18    Referring Provider  Dr. Servando Snare      NuStep   Level  1    SPM  65  Minutes  15    METs  1.8      Arm Ergometer   Level  1     Watts  12    RPM  15    Minutes  20    METs  1.9      Prescription Details   Frequency (times per week)  3    Duration  Progress to 30 minutes of continuous aerobic without signs/symptoms of physical distress      Intensity   THRR 40-80% of Max Heartrate  [phone]    Ratings of Perceived Exertion  11-13    Perceived Dyspnea  0-4      Progression   Progression  Continue progressive overload as per policy without signs/symptoms or physical distress.      Resistance Training   Training Prescription  Yes    Weight  1    Reps  10-15       Perform Capillary Blood Glucose checks as needed.  Exercise Prescription Changes:  Exercise Prescription Changes    Row Name 05/08/18 1200 05/25/18 1400 06/08/18 1400         Response to Exercise   Blood Pressure (Admit)  130/62  110/54  120/68     Blood Pressure (Exercise)  120/70  140/60  120/50     Blood Pressure (Exit)  108/62  100/52  126/64     Heart Rate (Admit)  95 bpm  92 bpm  85 bpm     Heart Rate (Exercise)  101 bpm  105 bpm  90 bpm     Heart Rate (Exit)  100 bpm  101 bpm  89 bpm     Rating of Perceived Exertion (Exercise)  13  12  10      Duration  Progress to 30 minutes of  aerobic without signs/symptoms of physical distress  Progress to 30 minutes of  aerobic without signs/symptoms of physical distress  Progress to 30 minutes of  aerobic without signs/symptoms of physical distress     Intensity  THRR New [phone]  THRR New 118-130-143  THRR New 113-128-142       Progression   Progression  Continue to progress workloads to maintain intensity without signs/symptoms of physical distress.  Continue to progress workloads to maintain intensity without signs/symptoms of physical distress.  Continue to progress workloads to maintain intensity without signs/symptoms of physical distress.       Resistance Training   Training Prescription  Yes  Yes  Yes     Weight  1  1  2      Reps  10-15  10-15  10-15       NuStep   Level  1   1  1      SPM  57  85  64     Minutes  15  15  15      METs  1.6  1.8  1.7       Arm Ergometer   Level  1  1  1.4     Watts  11  12  10      RPM  15  15  15      Minutes  20  20  20      METs  1.8  1.9  1.7        Exercise Comments:  Exercise Comments    Row Name 05/08/18 1242 05/25/18 1433 06/08/18 1454       Exercise Comments  Patient has just started CR and will be propressed  accoridngly with her exercise goals.   Patient is doing well in CR. patient has maintained her levels so far and will be progressed further by next 2 week progression. Patient has been weak some days and having glucose problems also .   Patient has been doing well in CR. patient has increased her level on the arm ergometer to 1.4 and has also increased the size of her dumbbells to 2lbs. Patient has been impressing [place] all in CR.         Exercise Goals and Review:  Exercise Goals    Row Name 05/05/18 1533             Exercise Goals   Increase Physical Activity  Yes       Intervention  Provide advice, education, support and counseling about physical activity/exercise needs.;Develop an individualized exercise prescription for aerobic and resistive training based on initial evaluation findings, risk stratification, comorbidities and participant's personal goals.       Expected Outcomes  Short Term: Attend rehab on a regular basis to increase amount of physical activity.       Increase Strength and Stamina  Yes       Intervention  Provide advice, education, support and counseling about physical activity/exercise needs.;Develop an individualized exercise prescription for aerobic and resistive training based on initial evaluation findings, risk stratification, comorbidities and participant's personal goals.       Expected Outcomes  Short Term: Increase workloads from initial exercise prescription for resistance, speed, and METs.       Able to understand and use rate of perceived exertion (RPE) scale  Yes        Intervention  Provide education and explanation on how to use RPE scale       Expected Outcomes  Long Term:  Able to use RPE to guide intensity level when exercising independently;Short Term: Able to use RPE daily in rehab to express subjective intensity level       Able to understand and use Dyspnea scale  Yes       Intervention  Provide education and explanation on how to use Dyspnea scale       Expected Outcomes  Short Term: Able to use Dyspnea scale daily in rehab to express subjective sense of shortness of breath during exertion;Long Term: Able to use Dyspnea scale to guide intensity level when exercising independently       Knowledge and understanding of Target Heart Rate Range (THRR)  Yes       Intervention  Provide education and explanation of THRR including how the numbers were predicted and where they are located for reference       Expected Outcomes  Short Term: Able to state/look up THRR       Able to check pulse independently  Yes       Intervention  Provide education and demonstration on how to check pulse in carotid and radial arteries.;Review the importance of being able to check your own pulse for safety during independent exercise       Expected Outcomes  Short Term: Able to explain why pulse checking is important during independent exercise;Long Term: Able to check pulse independently and accurately       Understanding of Exercise Prescription  Yes       Intervention  Provide education, explanation, and written materials on patient's individual exercise prescription       Expected Outcomes  Long Term: Able to explain home exercise prescription to exercise independently;Short Term:  Able to explain program exercise prescription          Exercise Goals Re-Evaluation : Exercise Goals Re-Evaluation    Row Name 05/18/18 1503 06/12/18 1430           Exercise Goal Re-Evaluation   Exercise Goals Review  Increase Physical Activity;Increase Strength and Stamina;Able to understand and  use rate of perceived exertion (RPE) scale;Able to understand and use Dyspnea scale;Understanding of Exercise Prescription;Able to check pulse independently;Knowledge and understanding of Target Heart Rate Range (THRR)  Increase Physical Activity;Increase Strength and Stamina;Able to understand and use rate of perceived exertion (RPE) scale;Able to understand and use Dyspnea scale;Understanding of Exercise Prescription;Able to check pulse independently;Knowledge and understanding of Target Heart Rate Range (THRR)      Comments  Patient has only been to CR for 5 sessions. Patient will be progressed to meet her exercise related goals in time.   Patient has been doing very well in CR. Patient has stated to me that she has gained better mobilty from the program and she is going to talk to her doctor about being able to drive again.      Expected Outcomes  Patient wishes to gain mobility and to be able to drive again.   Patient wishes to gain mobility and to be able to drive again.           Discharge Exercise Prescription (Final Exercise Prescription Changes): Exercise Prescription Changes - 06/08/18 1400      Response to Exercise   Blood Pressure (Admit)  120/68    Blood Pressure (Exercise)  120/50    Blood Pressure (Exit)  126/64    Heart Rate (Admit)  85 bpm    Heart Rate (Exercise)  90 bpm    Heart Rate (Exit)  89 bpm    Rating of Perceived Exertion (Exercise)  10    Duration  Progress to 30 minutes of  aerobic without signs/symptoms of physical distress    Intensity  THRR New [phone]      Progression   Progression  Continue to progress workloads to maintain intensity without signs/symptoms of physical distress.      Resistance Training   Training Prescription  Yes    Weight  2    Reps  10-15      NuStep   Level  1    SPM  64    Minutes  15    METs  1.7      Arm Ergometer   Level  1.4    Watts  10    RPM  15    Minutes  20    METs  1.7       Nutrition:  Target Goals:  Understanding of nutrition guidelines, daily intake of sodium 1500mg , cholesterol 200mg , calories 30% from fat and 7% or less from saturated fats, daily to have 5 or more servings of fruits and vegetables.  Biometrics: Pre Biometrics - 05/05/18 1534      Pre Biometrics   Height  5\' 2"  (1.575 m)    Weight  142 lb 3.2 oz (64.5 kg)    Waist Circumference  34.5 inches    Hip Circumference  37.5 inches    Waist to Hip Ratio  0.92 %    BMI (Calculated)  26    Triceps Skinfold  10 mm    % Body Fat  32.6 %    Grip Strength  34.2 kg    Flexibility  12.5 in  Single Leg Stand  0 seconds Balance Concerns        Nutrition Therapy Plan and Nutrition Goals: Nutrition Therapy & Goals - 06/15/18 1336      Nutrition Therapy   RD appointment deferred  Yes      Personal Nutrition Goals   Personal Goal #2  Patient continues to eat a heart healthy along with low sodium diet.     Additional Goals?  No       Nutrition Assessments: Nutrition Assessments - 05/05/18 1624      MEDFICTS Scores   Pre Score  30       Nutrition Goals Re-Evaluation:   Nutrition Goals Discharge (Final Nutrition Goals Re-Evaluation):   Psychosocial: Target Goals: Acknowledge presence or absence of significant depression and/or stress, maximize coping skills, provide positive support system. Participant is able to verbalize types and ability to use techniques and skills needed for reducing stress and depression.  Initial Review & Psychosocial Screening: Initial Psych Review & Screening - 05/05/18 1627      Initial Review   Current issues with  Current Depression      Family Dynamics   Good Support System?  Yes      Barriers   Psychosocial barriers to participate in program  Psychosocial barriers identified (see note)      Screening Interventions   Interventions  Encouraged to exercise;To provide support and resources with identified psychosocial needs;Provide feedback about the scores to participant     Expected Outcomes  Short Term goal: Identification and review with participant of any Quality of Life or Depression concerns found by scoring the questionnaire.;Long Term goal: The participant improves quality of Life and PHQ9 Scores as seen by post scores and/or verbalization of changes       Quality of Life Scores: Quality of Life - 05/05/18 1535      Quality of Life Scores   Health/Function Pre  12.07 %    Socioeconomic Pre  27.38 %    Psych/Spiritual Pre  23.14 %    Family Pre  18 %    GLOBAL Pre  18.82 %      Scores of 19 and below usually indicate a poorer quality of life in these areas.  A difference of  2-3 points is a clinically meaningful difference.  A difference of 2-3 points in the total score of the Quality of Life Index has been associated with significant improvement in overall quality of life, self-image, physical symptoms, and general health in studies assessing change in quality of life.  PHQ-9: Recent Review Flowsheet Data    Depression screen [facility] 2/9 05/05/2018 10/29/2017 04/13/2012 01/27/2012   Decreased Interest 2 0 1 1   Down, Depressed, Hopeless 2 0 1 1   PHQ - 2 Score 4 0 2 2   Altered sleeping 2 - - -   Tired, decreased energy 2 - - -   Change in appetite 2 - - -   Feeling bad or failure about yourself  2 - - -   Trouble concentrating 1 - - -   Moving slowly or fidgety/restless 2 - - -   Suicidal thoughts 0 - - -   PHQ-9 Score 15 - - -   Difficult doing work/chores Very difficult - - -     Interpretation of Total Score  Total Score Depression Severity:  1-4 = Minimal depression, 5-9 = Mild depression, 10-14 = Moderate depression, 15-19 = Moderately severe depression, 20-27 = Severe depression  Psychosocial Evaluation and Intervention: Psychosocial Evaluation - 05/05/18 1628      Psychosocial Evaluation & Interventions   Interventions  Encouraged to exercise with the program and follow exercise prescription;Relaxation education;Stress management  education    Continue Psychosocial Services   Follow up required by staff       Psychosocial Re-Evaluation: Psychosocial Re-Evaluation    [place] Name 05/21/18 0911 06/15/18 1343           Psychosocial Re-Evaluation   Current issues with  Current Depression;Current Stress Concerns  Current Depression;Current Stress Concerns      Comments  Patient's initial QOL score was 18.82 scoring lowest in health and function. Her PHQ-9 score was 15. She is currently being treated with Cymbalta 90 mg daily. She does not want to see a counselor but feels the Cymbalta is managing her depression.   Patient's initial QOL score was 18.82 scoring lowest in health and function. Her PHQ-9 score was 15. She is currently being treated with Cymbalta 90 mg daily. She does not want to see a counselor but feels the Cymbalta is managing her depression.       Expected Outcomes  Patient will have improved QOL and PHQ-9 scores and no additional psychosocial barriers identified at discharge.   Patient will have improved QOL and PHQ-9 scores and no additional psychosocial barriers identified at discharge.       Interventions  Stress management education;Encouraged to attend Cardiac Rehabilitation for the exercise;Relaxation education  Stress management education;Encouraged to attend Cardiac Rehabilitation for the exercise;Relaxation education      Comments  Patient's current stress concerns are her health and her lack of independence.   Patient's current stress concerns are her health and her lack of independence.         Initial Review   Source of Stress Concerns  Chronic Illness  Chronic Illness         Psychosocial Discharge (Final Psychosocial Re-Evaluation): Psychosocial Re-Evaluation - 06/15/18 1343      Psychosocial Re-Evaluation   Current issues with  Current Depression;Current Stress Concerns    Comments  Patient's initial QOL score was 18.82 scoring lowest in health and function. Her PHQ-9 score was 15. She is  currently being treated with Cymbalta 90 mg daily. She does not want to see a counselor but feels the Cymbalta is managing her depression.     Expected Outcomes  Patient will have improved QOL and PHQ-9 scores and no additional psychosocial barriers identified at discharge.     Interventions  Stress management education;Encouraged to attend Cardiac Rehabilitation for the exercise;Relaxation education    Comments  Patient's current stress concerns are her health and her lack of independence.       Initial Review   Source of Stress Concerns  Chronic Illness       Vocational Rehabilitation: Provide vocational rehab assistance to qualifying candidates.   Vocational Rehab Evaluation & Intervention: Vocational Rehab - 05/05/18 1612      Initial Vocational Rehab Evaluation & Intervention   Assessment shows need for Vocational Rehabilitation  No       Education: Education Goals: Education classes will be provided on a weekly basis, covering required topics. Participant will state understanding/return demonstration of topics presented.  Learning Barriers/Preferences: Learning Barriers/Preferences - 05/05/18 1610      Learning Barriers/Preferences   Learning Barriers  None    Learning Preferences  Written Material;Pictoral;Computer/Internet       Education Topics: Hypertension, Hypertension Reduction -Define heart disease and high blood  pressure. Discus how high blood pressure affects the body and ways to reduce high blood pressure.   Exercise and Your Heart -Discuss why it is important to exercise, the FITT principles of exercise, normal and abnormal responses to exercise, and how to exercise safely.   Angina -Discuss definition of angina, causes of angina, treatment of angina, and how to decrease risk of having angina.   Cardiac Medications -Review what the following cardiac medications are used for, how they affect the body, and side effects that may occur when taking the  medications.  Medications include Aspirin, Beta blockers, calcium channel blockers, ACE Inhibitors, angiotensin receptor blockers, diuretics, digoxin, and antihyperlipidemics.   Congestive Heart Failure -Discuss the definition of CHF, how to live with CHF, the signs and symptoms of CHF, and how keep track of weight and sodium intake.   CARDIAC REHAB PHASE II EXERCISE from 06/10/2018 in [place]  Date  05/13/18  Educator  DC  Instruction Review Code  2- Demonstrated Understanding      Heart Disease and Intimacy -Discus the effect sexual activity has on the heart, how changes occur during intimacy as we age, and safety during sexual activity.   Smoking Cessation / COPD -Discuss different methods to quit smoking, the health benefits of quitting smoking, and the definition of COPD.   Nutrition I: Fats -Discuss the types of cholesterol, what cholesterol does to the heart, and how cholesterol levels can be controlled.   CARDIAC REHAB PHASE II EXERCISE from 06/10/2018 in [place]  Date  06/10/18  Educator  DJ  Instruction Review Code  2- Demonstrated Understanding      Nutrition II: Labels -Discuss the different components of food labels and how to read food label   CARDIAC REHAB PHASE II EXERCISE from 06/10/2018 in [place]  Date  06/03/18  Educator  DC  Instruction Review Code  2- Demonstrated Understanding      Heart Parts/Heart Disease and PAD -Discuss the anatomy of the heart, the pathway of blood circulation through the heart, and these are affected by heart disease.   Stress I: Signs and Symptoms -Discuss the causes of stress, how stress may lead to anxiety and depression, and ways to limit stress.   Stress II: Relaxation -Discuss different types of relaxation techniques to limit stress.   Warning Signs of Stroke / TIA -Discuss definition of a stroke, what the signs and symptoms are of a stroke,  and how to identify when someone is having stroke.   Knowledge Questionnaire Score: Knowledge Questionnaire Score - 05/05/18 1610      Knowledge Questionnaire Score   Pre Score  22/24       Core Components/Risk Factors/Patient Goals at Admission: Personal Goals and Risk Factors at Admission - 05/05/18 1624      Core Components/Risk Factors/Patient Goals on Admission    Weight Management  Weight Maintenance    Diabetes  Yes    Intervention  Provide education about signs/symptoms and action to take for hypo/hyperglycemia.    Expected Outcomes  Short Term: Participant verbalizes understanding of the signs/symptoms and immediate care of hyper/hypoglycemia, proper foot care and importance of medication, aerobic/resistive exercise and nutrition plan for blood glucose control.;Long Term: Attainment of HbA1C < 7%.    Stress  Yes    Intervention  Offer individual and/or small group education and counseling on adjustment to heart disease, stress management and health-related lifestyle change. Teach and support self-help strategies.    Expected  Outcomes  Short Term: Participant demonstrates changes in health-related behavior, relaxation and other stress management skills, ability to obtain effective social support, and compliance with psychotropic medications if prescribed.;Long Term: Emotional wellbeing is indicated by absence of clinically significant psychosocial distress or social isolation.    Personal Goal Other  Yes    Personal Goal  Gain more mobility, Drive again, be more independent    Intervention  Attend CR 3 x week and supplement at home exercise 2 x week.     Expected Outcomes  Achieve personal goals.        Core Components/Risk Factors/Patient Goals Review:  Goals and Risk Factor Review    Row Name 05/21/18 0902 06/15/18 1337           Core Components/Risk Factors/Patient Goals Review   Personal Goals Review  Diabetes;Stress;Weight Management/Obesity Increase mobility; drive  again; more independent.   Diabetes;Stress;Weight Management/Obesity Get more mobility; drive again; be more independent. Get liver transplant.       Review  Patient has completed 7 sessions gaining 7.5 lbs since she started the program. She has cirrohosis of the liver which she attribtues to her weight gain. She hopes to get a transplant. She is currently on the transplant list. She is doing well in the program with some progression. She says she does feel a little stronger since she started and hopes to continue to improve as she continues the program. She says her husband sees a difference in her appearance and her activity since she has been attending. Her last A1C was 11/10/17 was 7.5. Her fasting readings reported during sessions average 200. Will continue to monitor for progress.   Patient has completed 17 sessions losing 7 lbs since he started the program. She continues to do well in the program with progression. She says [facility] transplant team continue to be in contact with her and she is hoping to go in the list. She has not recent A1C's on file. Her reported glucose readings have been labile. She says she feels more mobile and feels her strength has increased. She says her husband has noticed she is more independent and is doing more around the house. Will continue to monitor for progress.       Expected Outcomes  Patient will continue to attend sessions and complete the program meeting her personal needs.   Patient will continue to attend sessions and complete the program meeting her personal needs.          Core Components/Risk Factors/Patient Goals at Discharge (Final Review):  Goals and Risk Factor Review - 06/15/18 1337      Core Components/Risk Factors/Patient Goals Review   Personal Goals Review  Diabetes;Stress;Weight Management/Obesity Get more mobility; drive again; be more independent. Get liver transplant.     Review  Patient has completed 17 sessions losing 7 lbs since he started the  program. She continues to do well in the program with progression. She says [facility] transplant team continue to be in contact with her and she is hoping to go in the list. She has not recent A1C's on file. Her reported glucose readings have been labile. She says she feels more mobile and feels her strength has increased. She says her husband has noticed she is more independent and is doing more around the house. Will continue to monitor for progress.     Expected Outcomes  Patient will continue to attend sessions and complete the program meeting her personal needs.  ITP Comments: ITP Comments    Row Name 05/05/18 1613           ITP Comments  Mrs. Gangl is a pleasant 67 year old patient. She has been through the program in 2012 due to previous heart events. She has cirrhosis of liver non alcohol induced. She has poor balance. She is very egaer to get started to regain her strength.           Comments: ITP 30 Day REVIEW Patient doing well in the program. Will continue to monitor for progress.

## 2018-06-17 ENCOUNTER — Ambulatory Visit (HOSPITAL_COMMUNITY): Payer: Medicare Other

## 2018-06-17 ENCOUNTER — Encounter (HOSPITAL_COMMUNITY)
Admission: RE | Admit: 2018-06-17 | Discharge: 2018-06-17 | Disposition: A | Discharge status: Home / Self Care | Payer: Medicare Other | Source: Ambulatory Visit | Attending: Cardiology | Admitting: Cardiology

## 2018-06-17 DIAGNOSIS — Z952 Presence of prosthetic heart valve: Secondary | ICD-10-CM

## 2018-06-17 DIAGNOSIS — Z953 Presence of xenogenic heart valve: Secondary | ICD-10-CM | POA: Diagnosis not present

## 2018-06-17 NOTE — Progress Notes (Signed)
Daily Session Note  Patient Details  Name: Denise Cuevas MRN: [id number] Date of Birth: 04/10/1951 Referring Provider:     CARDIAC REHAB PHASE II ORIENTATION from 05/05/2018 in [place]  Referring Provider  Dr. Servando Snare      Encounter Date: 06/17/2018  Check In: Session Check In - 06/17/18 0930      Check-In   Location  [facility]    Staff Present  Aundra Dubin, RN, BSN;Gregory Cowan, BS, EP, Exercise Physiologist;Diane Coad, MS, EP, [facility], Exercise Physiologist    Supervising physician immediately available to respond to emergencies  See telemetry face sheet for immediately available MD    Medication changes reported      No    Fall or balance concerns reported     Yes    Comments  Has fallen twice within 12 months.     Warm-up and Cool-down  Performed as group-led Higher education careers adviser Performed  Yes    VAD Patient?  No    PAD/SET Patient?  No      Pain Assessment   Currently in Pain?  No/denies    Pain Score  0-No pain    Multiple Pain Sites  No       Capillary Blood Glucose: No results found for this or any previous visit (from the past 24 hour(s)).    Social History   Tobacco Use  Smoking Status Never Smoker  Smokeless Tobacco Never Used    Goals Met:  Independence with exercise equipment Exercise tolerated well No report of cardiac concerns or symptoms Strength training completed today  Goals Unmet:  Not Applicable  Comments: Check out 1030.   Dr. Kate Sable is Medical Director for [facility] Cardiac and Pulmonary Rehab.

## 2018-06-19 ENCOUNTER — Ambulatory Visit (HOSPITAL_COMMUNITY): Payer: Medicare Other

## 2018-06-19 ENCOUNTER — Encounter (HOSPITAL_COMMUNITY): Payer: Medicare Other

## 2018-06-22 ENCOUNTER — Ambulatory Visit (HOSPITAL_COMMUNITY): Payer: Medicare Other

## 2018-06-22 ENCOUNTER — Encounter (HOSPITAL_COMMUNITY)
Admission: RE | Admit: 2018-06-22 | Discharge: 2018-06-22 | Disposition: A | Discharge status: Home / Self Care | Payer: Medicare Other | Source: Ambulatory Visit | Attending: Cardiology | Admitting: Cardiology

## 2018-06-22 DIAGNOSIS — Z952 Presence of prosthetic heart valve: Secondary | ICD-10-CM

## 2018-06-22 DIAGNOSIS — Z953 Presence of xenogenic heart valve: Secondary | ICD-10-CM | POA: Diagnosis not present

## 2018-06-22 NOTE — Progress Notes (Signed)
Daily Session Note  Patient Details  Name: Denise Cuevas MRN: [id number] Date of Birth: [birth year] Referring Provider:     [place] from 05/05/2018 in [place]  Referring Provider  Dr. Servando Snare      Encounter Date: 06/22/2018  Check In: Session Check In - 06/22/18 0930      Check-In   Location  [facility]    Staff Present  Aundra Dubin, RN, BSN;Diane Coad, MS, EP, [facility], Exercise Physiologist    Supervising physician immediately available to respond to emergencies  See telemetry face sheet for immediately available MD    Medication changes reported      Yes    Comments  MD added Liness 145 mg daily 06/16/18.    Fall or balance concerns reported     Yes    Comments  Has fallen twice within 12 months.     Warm-up and Cool-down  Performed as group-led instruction    VAD Patient?  No    PAD/SET Patient?  No      Pain Assessment   Currently in Pain?  No/denies    Pain Score  0-No pain    Multiple Pain Sites  No       Capillary Blood Glucose: No results found for this or any previous visit (from the past 24 hour(s)).    Social History   Tobacco Use  Smoking Status Never Smoker  Smokeless Tobacco Never Used    Goals Met:  Independence with exercise equipment Exercise tolerated well No report of cardiac concerns or symptoms Strength training completed today  Goals Unmet:  Not Applicable  Comments: Check out 1030.   Dr. Kate Sable is Medical Director for [facility] Cardiac and Pulmonary Rehab.

## 2018-06-24 ENCOUNTER — Encounter (HOSPITAL_COMMUNITY)
Admission: RE | Admit: 2018-06-24 | Discharge: 2018-06-24 | Disposition: A | Discharge status: Home / Self Care | Payer: Medicare Other | Source: Ambulatory Visit | Attending: Cardiology | Admitting: Cardiology

## 2018-06-24 ENCOUNTER — Ambulatory Visit (HOSPITAL_COMMUNITY): Payer: Medicare Other

## 2018-06-24 DIAGNOSIS — Z953 Presence of xenogenic heart valve: Secondary | ICD-10-CM | POA: Diagnosis not present

## 2018-06-24 NOTE — Progress Notes (Signed)
Daily Session Note  Patient Details  Name: Denise Cuevas MRN: [id number] Date of Birth: [birth year] Referring Provider:     CARDIAC REHAB PHASE II ORIENTATION from 05/05/2018 in [place]  Referring Provider  Dr. Servando Snare      Encounter Date: 06/24/2018  Check In: Session Check In - 06/24/18 0915      Check-In   Staff Present  Russella Dar, MS, EP, [facility], Exercise Physiologist;Debra Wynetta Emery, RN, BSN    Supervising physician immediately available to respond to emergencies  See telemetry face sheet for immediately available MD    Medication changes reported      No    Fall or balance concerns reported     Yes    Warm-up and Cool-down  Performed as group-led instruction    Resistance Training Performed  Yes    VAD Patient?  No    PAD/SET Patient?  No      Pain Assessment   Currently in Pain?  No/denies    Pain Score  0-No pain    Multiple Pain Sites  No       Capillary Blood Glucose: No results found for this or any previous visit (from the past 24 hour(s)).    Social History   Tobacco Use  Smoking Status Never Smoker  Smokeless Tobacco Never Used    Goals Met:  Independence with exercise equipment Exercise tolerated well Personal goals reviewed No report of cardiac concerns or symptoms Strength training completed today  Goals Unmet:  Not Applicable  Comments: Check out: 0915   Dr. Kate Sable is Medical Director for [place] and Pulmonary Rehab.

## 2018-06-26 ENCOUNTER — Ambulatory Visit (HOSPITAL_COMMUNITY): Payer: Medicare Other

## 2018-06-26 ENCOUNTER — Encounter (HOSPITAL_COMMUNITY)
Admission: RE | Admit: 2018-06-26 | Discharge: 2018-06-26 | Disposition: A | Discharge status: Home / Self Care | Payer: Medicare Other | Source: Ambulatory Visit | Attending: Cardiology | Admitting: Cardiology

## 2018-06-26 DIAGNOSIS — Z953 Presence of xenogenic heart valve: Secondary | ICD-10-CM | POA: Diagnosis not present

## 2018-06-26 NOTE — Progress Notes (Signed)
Daily Session Note  Patient Details  Name: NOE PITTSLEY MRN: [id number] Date of Birth: May 21, 1951 Referring Provider:     CARDIAC REHAB PHASE II ORIENTATION from 05/05/2018 in [place]  Referring Provider  Dr. Servando Snare      Encounter Date: 06/26/2018  Check In: Session Check In - 06/26/18 0930      Check-In   Location  [facility]    Staff Present  Russella Dar, MS, EP, [facility], Exercise Physiologist;Debra Wynetta Emery, RN, BSN    Supervising physician immediately available to respond to emergencies  See telemetry face sheet for immediately available MD    Medication changes reported      No    Fall or balance concerns reported     Yes    Warm-up and Cool-down  Performed as group-led instruction    Resistance Training Performed  Yes    VAD Patient?  No    PAD/SET Patient?  No      Pain Assessment   Currently in Pain?  No/denies    Pain Score  0-No pain    Multiple Pain Sites  No       Capillary Blood Glucose: No results found for this or any previous visit (from the past 24 hour(s)).    Social History   Tobacco Use  Smoking Status Never Smoker  Smokeless Tobacco Never Used    Goals Met:  Independence with exercise equipment Exercise tolerated well No report of cardiac concerns or symptoms Strength training completed today  Goals Unmet:  Not Applicable  Comments: Check out: 1030   Dr. Kate Sable is Medical Director for [place] and Pulmonary Rehab.

## 2018-06-29 ENCOUNTER — Ambulatory Visit (HOSPITAL_COMMUNITY): Payer: Medicare Other

## 2018-06-29 ENCOUNTER — Encounter (HOSPITAL_COMMUNITY)
Admission: RE | Admit: 2018-06-29 | Discharge: 2018-06-29 | Disposition: A | Discharge status: Home / Self Care | Payer: Medicare Other | Source: Ambulatory Visit | Attending: Cardiology | Admitting: Cardiology

## 2018-06-29 DIAGNOSIS — Z953 Presence of xenogenic heart valve: Secondary | ICD-10-CM | POA: Diagnosis not present

## 2018-06-29 DIAGNOSIS — Z952 Presence of prosthetic heart valve: Secondary | ICD-10-CM

## 2018-06-29 NOTE — Progress Notes (Signed)
Daily Session Note  Patient Details  Name: Denise Cuevas MRN: [id number] Date of Birth: 24-Nov-1951 Referring Provider:     CARDIAC REHAB PHASE II ORIENTATION from 05/05/2018 in [place]  Referring Provider  Dr. Servando Snare      Encounter Date: 06/29/2018  Check In: Session Check In - 06/29/18 0930      Check-In   Location  [facility]    Staff Present  Russella Dar, MS, EP, [facility], Exercise Physiologist;Alexiz Cothran Wynetta Emery, RN, BSN    Supervising physician immediately available to respond to emergencies  See telemetry face sheet for immediately available MD    Medication changes reported      No    Fall or balance concerns reported     Yes    Comments  Has fallen twice within 12 months.     Warm-up and Cool-down  Performed as group-led Higher education careers adviser Performed  Yes    VAD Patient?  No    PAD/SET Patient?  No      Pain Assessment   Currently in Pain?  No/denies    Pain Score  0-No pain    Multiple Pain Sites  No       Capillary Blood Glucose: No results found for this or any previous visit (from the past 24 hour(s)).    Social History   Tobacco Use  Smoking Status Never Smoker  Smokeless Tobacco Never Used    Goals Met:  Independence with exercise equipment Exercise tolerated well No report of cardiac concerns or symptoms Strength training completed today  Goals Unmet:  Not Applicable  Comments: Check out 1030.   Dr. Kate Sable is Medical Director for [facility] Cardiac and Pulmonary Rehab.

## 2018-07-01 ENCOUNTER — Ambulatory Visit (HOSPITAL_COMMUNITY): Payer: Medicare Other

## 2018-07-01 ENCOUNTER — Encounter (HOSPITAL_COMMUNITY)
Admission: RE | Admit: 2018-07-01 | Discharge: 2018-07-01 | Disposition: A | Discharge status: Home / Self Care | Payer: Medicare Other | Source: Ambulatory Visit | Attending: Cardiology | Admitting: Cardiology

## 2018-07-01 DIAGNOSIS — Z953 Presence of xenogenic heart valve: Secondary | ICD-10-CM | POA: Diagnosis not present

## 2018-07-01 NOTE — Progress Notes (Signed)
Daily Session Note  Patient Details  Name: [name] MRN: [id number] Date of Birth: [birth year] Referring Provider:     [place] from 05/05/2018 in [place]  Referring Provider  Dr. Servando Snare      Encounter Date: 07/01/2018  Check In: Session Check In - 07/01/18 0815      Check-In   Location  [facility]    Staff Present  Russella Dar, MS, EP, [facility], Exercise Physiologist;Debra Wynetta Emery, RN, BSN    Supervising physician immediately available to respond to emergencies  See telemetry face sheet for immediately available MD    Medication changes reported      No    Fall or balance concerns reported     Yes    Comments  Has fallen twice within 12 months.     Warm-up and Cool-down  Performed as group-led Higher education careers adviser Performed  Yes    VAD Patient?  No      Pain Assessment   Currently in Pain?  No/denies    Pain Score  0-No pain    Multiple Pain Sites  No       Capillary Blood Glucose: No results found for this or any previous visit (from the past 24 hour(s)).    Social History   Tobacco Use  Smoking Status Never Smoker  Smokeless Tobacco Never Used    Goals Met:  Independence with exercise equipment Exercise tolerated well Personal goals reviewed No report of cardiac concerns or symptoms Strength training completed today  Goals Unmet:  Not Applicable  Comments: Check out: 0915   Dr. Kate Sable is Medical Director for [place] and Pulmonary Rehab.

## 2018-07-03 ENCOUNTER — Ambulatory Visit (HOSPITAL_COMMUNITY): Payer: Medicare Other

## 2018-07-03 ENCOUNTER — Encounter (HOSPITAL_COMMUNITY)
Admission: RE | Admit: 2018-07-03 | Discharge: 2018-07-03 | Disposition: A | Discharge status: Home / Self Care | Payer: Medicare Other | Source: Ambulatory Visit | Attending: Cardiology | Admitting: Cardiology

## 2018-07-03 DIAGNOSIS — Z953 Presence of xenogenic heart valve: Secondary | ICD-10-CM | POA: Diagnosis not present

## 2018-07-03 DIAGNOSIS — Z952 Presence of prosthetic heart valve: Secondary | ICD-10-CM

## 2018-07-03 NOTE — Progress Notes (Signed)
Daily Session Note  Patient Details  Name: Denise Cuevas MRN: [id number] Date of Birth: 07/28/1951 Referring Provider:     CARDIAC REHAB PHASE II ORIENTATION from 05/05/2018 in [place]  Referring Provider  Dr. Servando Snare      Encounter Date: 07/03/2018  Check In: Session Check In - 07/03/18 0930      Check-In   Supervising physician immediately available to respond to emergencies  See telemetry face sheet for immediately available MD    Location  [facility]    Staff Present  Denise Dar, MS, EP, [facility], Exercise Physiologist;Denise Wayment Wynetta Emery, RN, BSN    Medication changes reported      No    Fall or balance concerns reported     Yes    Comments  Has fallen twice within 12 months.     Warm-up and Cool-down  Performed as group-led Higher education careers adviser Performed  Yes    VAD Patient?  No    PAD/SET Patient?  No      Pain Assessment   Currently in Pain?  No/denies    Pain Score  0-No pain    Multiple Pain Sites  No       Capillary Blood Glucose: No results found for this or any previous visit (from the past 24 hour(s)).    Social History   Tobacco Use  Smoking Status Never Smoker  Smokeless Tobacco Never Used    Goals Met:  Independence with exercise equipment Exercise tolerated well No report of cardiac concerns or symptoms Strength training completed today  Goals Unmet:  Not Applicable  Comments: Check out 1030.   Dr. Kate Cuevas is Medical Director for [facility] Cardiac and Pulmonary Rehab.

## 2018-07-06 ENCOUNTER — Ambulatory Visit (HOSPITAL_COMMUNITY): Payer: Medicare Other

## 2018-07-06 ENCOUNTER — Encounter (HOSPITAL_COMMUNITY)
Admission: RE | Admit: 2018-07-06 | Discharge: 2018-07-06 | Disposition: A | Discharge status: Home / Self Care | Payer: Medicare Other | Source: Ambulatory Visit | Attending: Cardiology | Admitting: Cardiology

## 2018-07-06 DIAGNOSIS — Z953 Presence of xenogenic heart valve: Secondary | ICD-10-CM | POA: Diagnosis not present

## 2018-07-06 DIAGNOSIS — Z952 Presence of prosthetic heart valve: Secondary | ICD-10-CM

## 2018-07-06 NOTE — Progress Notes (Signed)
Daily Session Note  Patient Details  Name: Denise Cuevas MRN: [id number] Date of Birth: 10-Oct-1951 Referring Provider:     CARDIAC REHAB PHASE II ORIENTATION from 05/05/2018 in [place]  Referring Provider  Dr. Servando Snare      Encounter Date: 07/06/2018  Check In: Session Check In - 07/06/18 0930      Check-In   Supervising physician immediately available to respond to emergencies  See telemetry face sheet for immediately available MD    Location  [facility]    Staff Present  Russella Dar, MS, EP, [facility], Exercise Physiologist    Medication changes reported      No    Fall or balance concerns reported     Yes    Comments  Has fallen twice within 12 months.     Warm-up and Cool-down  Performed as group-led Higher education careers adviser Performed  Yes    VAD Patient?  No    PAD/SET Patient?  No      Pain Assessment   Currently in Pain?  No/denies    Pain Score  0-No pain    Multiple Pain Sites  No       Capillary Blood Glucose: No results found for this or any previous visit (from the past 24 hour(s)).    Social History   Tobacco Use  Smoking Status Never Smoker  Smokeless Tobacco Never Used    Goals Met:  Independence with exercise equipment Exercise tolerated well Personal goals reviewed No report of cardiac concerns or symptoms Strength training completed today  Goals Unmet:  Not Applicable  Comments: Check out: 1030   Dr. Kate Sable is Medical Director for [place] and Pulmonary Rehab.

## 2018-07-08 ENCOUNTER — Emergency Department (HOSPITAL_COMMUNITY)
Admission: EM | Admit: 2018-07-08 | Discharge: 2018-07-09 | Disposition: A | Discharge status: Other Acute Inpatient Hospital | Payer: Medicare Other | Attending: Internal Medicine | Admitting: Internal Medicine

## 2018-07-08 ENCOUNTER — Other Ambulatory Visit: Payer: Self-pay

## 2018-07-08 ENCOUNTER — Encounter (HOSPITAL_COMMUNITY)
Admission: RE | Admit: 2018-07-08 | Discharge: 2018-07-08 | Disposition: A | Discharge status: Home / Self Care | Payer: Medicare Other | Source: Ambulatory Visit | Attending: Cardiology | Admitting: Cardiology

## 2018-07-08 ENCOUNTER — Emergency Department (HOSPITAL_COMMUNITY): Payer: Medicare Other

## 2018-07-08 ENCOUNTER — Encounter (HOSPITAL_COMMUNITY): Payer: Medicare Other

## 2018-07-08 ENCOUNTER — Ambulatory Visit (HOSPITAL_COMMUNITY): Payer: Medicare Other

## 2018-07-08 ENCOUNTER — Encounter (HOSPITAL_COMMUNITY): Payer: Self-pay

## 2018-07-08 DIAGNOSIS — I110 Hypertensive heart disease with heart failure: Secondary | ICD-10-CM | POA: Insufficient documentation

## 2018-07-08 DIAGNOSIS — D696 Thrombocytopenia, unspecified: Secondary | ICD-10-CM | POA: Diagnosis present

## 2018-07-08 DIAGNOSIS — R188 Other ascites: Secondary | ICD-10-CM | POA: Diagnosis not present

## 2018-07-08 DIAGNOSIS — Z79899 Other long term (current) drug therapy: Secondary | ICD-10-CM | POA: Insufficient documentation

## 2018-07-08 DIAGNOSIS — E872 Acidosis, unspecified: Secondary | ICD-10-CM | POA: Diagnosis present

## 2018-07-08 DIAGNOSIS — J189 Pneumonia, unspecified organism: Secondary | ICD-10-CM | POA: Insufficient documentation

## 2018-07-08 DIAGNOSIS — N179 Acute kidney failure, unspecified: Secondary | ICD-10-CM | POA: Diagnosis not present

## 2018-07-08 DIAGNOSIS — E039 Hypothyroidism, unspecified: Secondary | ICD-10-CM | POA: Diagnosis not present

## 2018-07-08 DIAGNOSIS — E119 Type 2 diabetes mellitus without complications: Secondary | ICD-10-CM | POA: Insufficient documentation

## 2018-07-08 DIAGNOSIS — I10 Essential (primary) hypertension: Secondary | ICD-10-CM | POA: Diagnosis present

## 2018-07-08 DIAGNOSIS — F329 Major depressive disorder, single episode, unspecified: Secondary | ICD-10-CM | POA: Diagnosis present

## 2018-07-08 DIAGNOSIS — I5031 Acute diastolic (congestive) heart failure: Secondary | ICD-10-CM | POA: Insufficient documentation

## 2018-07-08 DIAGNOSIS — R6883 Chills (without fever): Secondary | ICD-10-CM | POA: Diagnosis present

## 2018-07-08 DIAGNOSIS — F32A Depression, unspecified: Secondary | ICD-10-CM | POA: Diagnosis present

## 2018-07-08 DIAGNOSIS — K219 Gastro-esophageal reflux disease without esophagitis: Secondary | ICD-10-CM | POA: Diagnosis present

## 2018-07-08 DIAGNOSIS — K7460 Unspecified cirrhosis of liver: Secondary | ICD-10-CM | POA: Diagnosis present

## 2018-07-08 DIAGNOSIS — Z953 Presence of xenogenic heart valve: Secondary | ICD-10-CM | POA: Diagnosis not present

## 2018-07-08 DIAGNOSIS — I2510 Atherosclerotic heart disease of native coronary artery without angina pectoris: Secondary | ICD-10-CM | POA: Diagnosis not present

## 2018-07-08 DIAGNOSIS — IMO0002 Reserved for concepts with insufficient information to code with codable children: Secondary | ICD-10-CM | POA: Diagnosis present

## 2018-07-08 DIAGNOSIS — D649 Anemia, unspecified: Secondary | ICD-10-CM | POA: Diagnosis present

## 2018-07-08 DIAGNOSIS — E1165 Type 2 diabetes mellitus with hyperglycemia: Secondary | ICD-10-CM | POA: Diagnosis present

## 2018-07-08 LAB — COMPREHENSIVE METABOLIC PANEL
ALT: 31 U/L (ref 0–44)
AST: 67 U/L — ABNORMAL HIGH (ref 15–41)
Albumin: 2.4 g/dL — ABNORMAL LOW (ref 3.5–5.0)
Alkaline Phosphatase: 178 U/L — ABNORMAL HIGH (ref 38–126)
Anion gap: 11 (ref 5–15)
BILIRUBIN TOTAL: 4 mg/dL — AB (ref 0.3–1.2)
BUN: 57 mg/dL — ABNORMAL HIGH (ref 8–23)
CHLORIDE: 92 mmol/L — AB (ref 98–111)
CO2: 25 mmol/L (ref 22–32)
Calcium: 8.2 mg/dL — ABNORMAL LOW (ref 8.9–10.3)
Creatinine, Ser: 2 mg/dL — ABNORMAL HIGH (ref 0.44–1.00)
GFR calc Af Amer: 29 mL/min — ABNORMAL LOW (ref >60)
GFR, EST NON AFRICAN AMERICAN: 25 mL/min — AB (ref >60)
Glucose, Bld: 229 mg/dL — ABNORMAL HIGH (ref 70–99)
Potassium: 4 mmol/L (ref 3.5–5.1)
Sodium: 128 mmol/L — ABNORMAL LOW (ref 135–145)
TOTAL PROTEIN: 5.6 g/dL — AB (ref 6.5–8.1)

## 2018-07-08 LAB — BODY FLUID CELL COUNT WITH DIFFERENTIAL
Eos, Fluid: 0 %
Lymphs, Fluid: 67 %
MONOCYTE-MACROPHAGE-SEROUS FLUID: 15 % — AB (ref 50–90)
Neutrophil Count, Fluid: 18 % (ref 0–25)
Total Nucleated Cell Count, Fluid: 133 cu mm (ref 0–1000)

## 2018-07-08 LAB — CBC WITH DIFFERENTIAL/PLATELET
Basophils Absolute: 0 10*3/uL (ref 0.0–0.1)
Basophils Relative: 0 %
EOS ABS: 0.1 10*3/uL (ref 0.0–0.7)
EOS PCT: 1 %
HCT: 29.7 % — ABNORMAL LOW (ref 36.0–46.0)
Hemoglobin: 9 g/dL — ABNORMAL LOW (ref 12.0–15.0)
Lymphocytes Relative: 7 %
Lymphs Abs: 1.4 10*3/uL (ref 0.7–4.0)
MCH: 22.6 pg — AB (ref 26.0–34.0)
MCHC: 30.3 g/dL (ref 30.0–36.0)
MCV: 74.6 fL — ABNORMAL LOW (ref 78.0–100.0)
MONO ABS: 1.4 10*3/uL — AB (ref 0.1–1.0)
Monocytes Relative: 7 %
Neutro Abs: 16.3 10*3/uL — ABNORMAL HIGH (ref 1.7–7.7)
Neutrophils Relative %: 85 %
Platelets: 124 10*3/uL — ABNORMAL LOW (ref 150–400)
RBC: 3.98 MIL/uL (ref 3.87–5.11)
RDW: 19.3 % — AB (ref 11.5–15.5)
WBC: 19.1 10*3/uL — AB (ref 4.0–10.5)

## 2018-07-08 LAB — GRAM STAIN

## 2018-07-08 LAB — LIPASE, BLOOD: LIPASE: 64 U/L — AB (ref 11–51)

## 2018-07-08 LAB — I-STAT CG4 LACTIC ACID, ED: Lactic Acid, Venous: 4.53 mmol/L (ref 0.5–1.9)

## 2018-07-08 MED ORDER — VANCOMYCIN HCL IN DEXTROSE 1-5 GM/200ML-% IV SOLN: 1000.0000 mg | INTRAVENOUS | Status: DC

## 2018-07-08 MED ORDER — SODIUM CHLORIDE 0.9 % IV SOLN
1.0000 g | Freq: Once | INTRAVENOUS | Status: AC
  Administered 2018-07-08: 1 g via INTRAVENOUS
  Filled 2018-07-08: qty 1

## 2018-07-08 MED ORDER — LACTATED RINGERS IV SOLN
INTRAVENOUS | Status: DC
  Administered 2018-07-08: 125 mL/h via INTRAVENOUS
  Administered 2018-07-08 – 2018-07-09 (×2): via INTRAVENOUS

## 2018-07-08 MED ORDER — ONDANSETRON HCL 4 MG/2ML IJ SOLN
4.0000 mg | Freq: Once | INTRAMUSCULAR | Status: AC
  Administered 2018-07-08: 4 mg via INTRAVENOUS
  Filled 2018-07-08: qty 2

## 2018-07-08 MED ORDER — VANCOMYCIN HCL 10 G IV SOLR
1750.0000 mg | Freq: Once | INTRAVENOUS | Status: AC
  Administered 2018-07-08: 1750 mg via INTRAVENOUS
  Filled 2018-07-08: qty 1750

## 2018-07-08 MED ORDER — SODIUM CHLORIDE 0.9 % IV SOLN: 1.0000 g | Freq: Three times a day (TID) | INTRAVENOUS | Status: DC

## 2018-07-08 NOTE — ED Provider Notes (Signed)
  Physical Exam  BP (!) 109/38   Pulse (!) 102   Temp 97.9 F (36.6 C) (Oral)   Resp 18   Ht 5\' 2"  (1.575 m)   Wt 82.6 kg (182 lb)   LMP  (LMP Unknown)   SpO2 99%   BMI 33.29 kg/m   Physical Exam  ED Course/Procedures     Procedures  MDM  Patient pending transfer to [facility].  No bed available yet.  Has pneumonia.  Reviewed records.  Will repeat lactic acid.  Has had some mild hypotension.  Will add medicine consult while pending transfer.       Davonna Belling, MD 07/08/18 [phone]

## 2018-07-08 NOTE — ED Notes (Signed)
Pt back from ultrasound, feeling much better. Removed 3.7 L, family at the bedside

## 2018-07-08 NOTE — ED Notes (Signed)
Pt back in bed, warm blankets, family waiting with pt for transfer

## 2018-07-08 NOTE — Progress Notes (Signed)
Paracentesis complete no signs of distress, 3.7 L ascites removed.

## 2018-07-08 NOTE — Progress Notes (Signed)
Pharmacy Antibiotic Note  Denise Cuevas is a 67 y.o. female admitted on 07/08/2018 with pneumonia.  Pharmacy has been consulted for Vancomycin dosing.  Plan: Vancomycin 1750 mg IV x 1 dose Vancomycin 1000 mg IV every 24 hours.  Goal trough 15-20 mcg/mL. Cefepime 1000 mg IV x 1 dose ordered in ER. Awaiting further orders Monitor labs, c/s, and vanco trough as indicated  Height: 5\' 2"  (157.5 cm) Weight: 182 lb (82.6 kg) IBW/kg (Calculated) : 50.1  Temp (24hrs), Avg:97.9 F (36.6 C), Min:97.9 F (36.6 C), Max:97.9 F (36.6 C)  Recent Labs  Lab 07/08/18 0945 07/08/18 0955  WBC 19.1*  --   CREATININE 2.00*  --   LATICACIDVEN  --  4.53*    Estimated Creatinine Clearance: 28.3 mL/min (A) (by C-G formula based on SCr of 2 mg/dL (H)).    Allergies  Allergen Reactions  . Exenatide Nausea And Vomiting  . Ace Inhibitors Other (See Comments)    Pseudoasthma. Pharmacy asked about this and patient does not recall so not sure if true allergy    Antimicrobials this admission: Vanco 7/31 >>  Cefepime 7/31 >>   Dose adjustments this admission: N/A  Microbiology results: Body fluid Cx: pending   Thank you for allowing pharmacy to be a part of this patient's care.  Ramond Craver 07/08/2018 1:27 PM

## 2018-07-08 NOTE — Progress Notes (Signed)
Incomplete Session Note  Patient Details  Name: Denise Cuevas MRN: [id number] Date of Birth: Oct 15, 1951 Referring Provider:     [place] from 05/05/2018 in [place]  Referring Provider  Dr. Jeannette Corpus did not complete her rehab session.  Patient arrived took seat in the lobby outside of CR. She asked for a sheet due to feeling cold. Went to check on [name] in lobby. She was shaking and trembling uncontrollably. Patients BS was 244. Called rapid response. The rapid response team took patient to ED for observation. Russella Dar, Manager

## 2018-07-08 NOTE — ED Notes (Signed)
Pt nausea and vomiting, EDP aware, verbal order given

## 2018-07-08 NOTE — ED Notes (Signed)
Called [facility] transfer line for update on a bed assignment and was advised the won't have any beds tonight.  Patient is on a wait list.  Advised to call back tomorrow for an update.

## 2018-07-08 NOTE — Procedures (Signed)
PreOperative Dx: Cirrhosis, ascites Postoperative Dx: Cirrhosis, ascites Procedure:   US guided paracentesis Radiologist:  Karigan Cloninger Anesthesia:  10 ml of1% lidocaine Specimen:  3.7 L of cloudy yellow ascitic fluid EBL:   < 1 ml Complications: None  

## 2018-07-08 NOTE — ED Notes (Signed)
Ok to feed pt per EDP, pt has low sodium soup and graham crackers. Will order low sodium meal tray for dinner.

## 2018-07-08 NOTE — ED Provider Notes (Signed)
[facility] Provider Note   CSN: [id number] Arrival date & time: 07/08/18  0915     History   Chief Complaint Chief Complaint  Patient presents with  . Chills    HPI PETULA ROTOLO is a 67 y.o. female.  HPI She with multiple medical issues notably liver failure, heart failure, CAD presents with chills, fever, weakness. Patient has been feeling well for the past day, today went to cardiac rehab, was found to be too weak to participate, sent here for evaluation. She denies focal pain, acknowledges generalized discomfort, and increasing discomfort about her abdomen, which is larger than usual, with substantial weight gain. Patient's history is most notable for ongoing evaluation at [facility] for consideration of liver transplant, though she is not currently on the transplant list. She has been completing cardiac rehab in an effort to qualify for transplant. Last rehab session was 2 days ago, completed by the patient. She is here with her husband and son who assist with the HPI. They deny confusion, disorientation, acknowledge after mentioned to generalized discomfort, abdominal girth increase, as well as weight gain. No cough, no dysuria. Reported the patient recently was diagnosed with bronchial infection, though this is unclear, and it is unclear if she is taking antibiotics currently.  Past Medical History:  Diagnosis Date  . Angina   . Asthma   . CHF (congestive heart failure) ([place])   . 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 COPD (chronic obstructive pulmonary disease) ([place])   . Coronary artery disease    s/p CABG  . Depression   . Diabetes mellitus    type 2  . Dyspnea   . Edema extremities    consistent edema lower legs, feet, and right quadrant abdominal pain-"goes and comes"  . Family history of adverse reaction to anesthesia    SISTER HAS NAUSEA  . Fibromyalgia   . GERD (gastroesophageal reflux disease)   . H/O hiatal  hernia   . Headache(784.0)    occ. generalized  . Heart murmur   . Hypercholesteremia   . Hypertension   . Hypothyroid   . Myocardial infarction [facility]) 11/2011   MI x2- 1'10,3'15 coronary stent(chest pain episode at time)  . PONV (postoperative nausea and vomiting)     Patient Active Problem List   Diagnosis Date Noted  . Hepatic encephalopathy ([place]) 12/17/2017  . Atrial fibrillation ([place]) [I48.91] 11/28/2017  . Hospital discharge follow-up 10/30/2017  . Nonrheumatic aortic valve stenosis   . Chronic bronchitis ([place])   . Other chest pain 10/17/2017  . Peripheral edema   . Unstable angina ([place]) 10/15/2017  . Asthma, cough variant   . Cirrhosis of liver not due to alcohol ([place]) 11/20/2016  . Pancytopenia ([place]) 11/20/2016  . Protein-calorie malnutrition, severe ([place]) 11/20/2016  . Acute diastolic CHF (congestive heart failure) ([place])   . Congestive heart failure ([place]) 11/19/2016  . Chest pain 05/29/2015  . Memory loss 05/29/2015  . Essential hypertension 03/20/2015  . Dyspnea 02/02/2015  . Acute respiratory distress 02/02/2015  . Depression   . GERD (gastroesophageal reflux disease)   . Unstable angina pectoris ([place]) 11/25/2011  . Hypothyroidism   . CAD (coronary artery disease)   . Other and unspecified angina pectoris 11/19/2011  . Type 2 diabetes mellitus, uncontrolled ([place]) 11/19/2011  . Myocardial infarction ([place]) 11/09/2011    Past Surgical History:  Procedure Laterality Date  . AORTIC VALVE REPLACEMENT N/A 11/12/2017   Procedure: AORTIC VALVE REPLACEMENT (AVR)  USING 19 MM MAGNA EASE PERICARDIAL BIOPROSTHESIS-AORTIC, MODEL 3300TFX;  Surgeon: [name], MD;  Location: [facility];  Service: Open Heart Surgery;  Laterality: N/A;  . ARTERIAL LINE INSERTION Right 11/12/2017   Procedure: ARTERIAL LINE INSERTION;  Surgeon: [name], MD;  Location: [facility];  Service: Open Heart Surgery;  Laterality: Right;  placed in right femoral artery.  . CHOLECYSTECTOMY      open many yrs ago  . COLONOSCOPY WITH PROPOFOL N/A 08/24/2014   Procedure: COLONOSCOPY WITH PROPOFOL;  Surgeon: Arta Silence, MD;  Location: [facility];  Service: Endoscopy;  Laterality: N/A;  . CORONARY ANGIOPLASTY WITH STENT PLACEMENT  05/06/2012  . CORONARY ARTERY BYPASS GRAFT  11/27/2011   Procedure: CORONARY ARTERY BYPASS GRAFTING (CABG);  Surgeon: [name], MD;  Location: [facility];  Service: Open Heart Surgery;  Laterality: N/A;  coronary artery bypass graft on pump times two utilizing left internal mammary artery and right saphenous vein harvested endoscopically   . ESOPHAGOGASTRODUODENOSCOPY (EGD) WITH PROPOFOL N/A 08/24/2014   Procedure: ESOPHAGOGASTRODUODENOSCOPY (EGD) WITH PROPOFOL;  Surgeon: Arta Silence, MD;  Location: [facility];  Service: Endoscopy;  Laterality: N/A;  . ESOPHAGOGASTRODUODENOSCOPY (EGD) WITH PROPOFOL N/A 02/16/2018   Procedure: ESOPHAGOGASTRODUODENOSCOPY (EGD) WITH PROPOFOL;  Surgeon: Otis Brace, MD;  Location: [facility];  Service: Gastroenterology;  Laterality: N/A;  . KNEE SURGERY Right    scope surgery  . LEFT HEART CATHETERIZATION WITH CORONARY ANGIOGRAM N/A 11/19/2011   Procedure: LEFT HEART CATHETERIZATION WITH CORONARY ANGIOGRAM;  Surgeon: Candee Furbish, MD;  Location[facility] CATH LAB;  Service: Cardiovascular;  Laterality: N/A;  . LEFT HEART CATHETERIZATION WITH CORONARY ANGIOGRAM  05/06/2012   Procedure: LEFT HEART CATHETERIZATION WITH CORONARY ANGIOGRAM;  Surgeon: Jettie Booze, MD;  Location: [facility] CATH LAB;  Service: Cardiovascular;;  . LEFT HEART CATHETERIZATION WITH CORONARY/GRAFT ANGIOGRAM N/A 03/24/2012   Procedure: LEFT HEART CATHETERIZATION WITH Beatrix Fetters;  Surgeon: Jettie Booze, MD;  Location[facility] C[facility] LAB;  Service: Cardiovascular;  Laterality: N/A;  . MITRAL VALVE REPLACEMENT N/A 11/12/2017   Procedure: MITRAL VALVE (MV) REPLACEMENT USING 25 MM MAGNA MITRAL EASE PERICARDIAL BIOPROSTHESIS, MODEL 7300TFX;   Surgeon: [name], MD;  Location: [place];  Service: Open Heart Surgery;  Laterality: N/A;  Using 66mm Magna Ease Mitral Pericardial Bioprosthesis  . PERCUTANEOUS CORONARY STENT INTERVENTION (PCI-S)  03/24/2012   Procedure: PERCUTANEOUS CORONARY STENT INTERVENTION (PCI-S);  Surgeon: Jettie Booze, MD;  Location: [facility] CATH LAB;  Service: Cardiovascular;;  . PERCUTANEOUS CORONARY STENT INTERVENTION (PCI-S) Bilateral 05/06/2012   Procedure: PERCUTANEOUS CORONARY STENT INTERVENTION (PCI-S);  Surgeon: Jettie Booze, MD;  Location: [facility] CATH LAB;  Service: Cardiovascular;  Laterality: Bilateral;  . RIGHT/LEFT HEART CATH AND CORONARY/GRAFT ANGIOGRAPHY N/A 10/20/2017   Procedure: RIGHT/LEFT HEART CATH AND CORONARY/GRAFT ANGIOGRAPHY;  Surgeon: Burnell Blanks, MD;  Location: [facility] CV LAB;  Service: Cardiovascular;  Laterality: N/A;  . TEE WITHOUT CARDIOVERSION N/A 10/20/2017   Procedure: TRANSESOPHAGEAL ECHOCARDIOGRAM (TEE);  Surgeon: Dorothy Spark, MD;  Location: [facility] ENDOSCOPY;  Service: Cardiovascular;  Laterality: N/A;  . TEE WITHOUT CARDIOVERSION N/A 11/12/2017   Procedure: TRANSESOPHAGEAL ECHOCARDIOGRAM (TEE);  Surgeon: [name], MD;  Location: [facility];  Service: Open Heart Surgery;  Laterality: N/A;     OB History   None      Home Medications    Prior to Admission medications   Medication Sig Start Date End Date Taking? Authorizing Provider  albuterol (PROVENTIL HFA;VENTOLIN HFA) 108 (90 BASE) MCG/ACT inhaler Inhale  2 puffs into the lungs every 6 (six) hours as needed for wheezing or shortness of breath. 02/04/15  Yes Ghimire, Henreitta Leber, MD  albuterol (PROVENTIL) (2.5 MG/3ML) 0.083% nebulizer solution Take 3 mLs (2.5 mg total) by nebulization every 2 (two) hours as needed for wheezing. 02/04/15  Yes Ghimire, Henreitta Leber, MD  DULoxetine (CYMBALTA) 30 MG capsule Take 30 mg by mouth daily with lunch. In conjunction with one 60 mg capsule to equal a total of 90  milligrams   Yes [provider]  DULoxetine (CYMBALTA) 60 MG capsule Take 60 mg by mouth daily with lunch. In conjunction with one 30 mg capsule to equal a total of 90 milligrams   Yes [provider]  furosemide (LASIX) 40 MG tablet Take 40 mg by mouth daily.   Yes [provider]  HUMALOG MIX 75/25 KWIKPEN (75-25) 100 UNIT/ML Kwikpen Inject 25 Units into the skin 2 (two) times daily. 02/20/18  Yes Nita Sells, MD  KRISTALOSE 20 g packet Take 20 g by mouth 3 (three) times daily. 01/12/18  Yes [provider]  levofloxacin (LEVAQUIN) 750 MG tablet Take 1 tablet by mouth daily. 07/02/18  Yes [provider]  levothyroxine (SYNTHROID, LEVOTHROID) 50 MCG tablet TAKE 1 TABLET(50 MCG) BY MOUTH DAILY BEFORE BREAKFAST 03/06/18  Yes Jerline Pain, MD  mometasone-formoterol (DULERA) 100-5 MCG/ACT AERO Inhale 2 puffs into the lungs 2 (two) times daily as needed for wheezing or shortness of breath.   Yes [provider]  omeprazole-sodium bicarbonate (ZEGERID) 40-1100 MG per capsule Take 1 capsule by mouth daily before breakfast.   Yes [provider]  spironolactone (ALDACTONE) 25 MG tablet Take 1 tablet (25 mg total) by mouth daily. 11/23/16  Yes Hongalgi, Lenis Dickinson, MD  XIFAXAN 550 MG TABS tablet Take 550 mg by mouth 2 (two) times daily. 01/20/18  Yes [provider]  methocarbamol (ROBAXIN) 750 MG tablet Take 1 tablet by mouth as needed. 07/02/18   [provider]    Family History Family History  Problem Relation Age of Onset  . Heart disease Father 12       died of MI  . Emphysema Father        smoked  . Peripheral vascular disease Mother 16       bilaterial amputations  . Breast cancer Neg Hx     Social History Social History   Tobacco Use  . Smoking status: Never Smoker  . Smokeless tobacco: Never Used  Substance Use Topics  . Alcohol use: No  . Drug use: No     Allergies   Exenatide and Ace  inhibitors   Review of Systems Review of Systems  Constitutional:       Per HPI, otherwise negative  HENT:       Per HPI, otherwise negative  Respiratory:       Per HPI, otherwise negative  Cardiovascular:       Per HPI, otherwise negative  Gastrointestinal: Negative for vomiting.  Endocrine:       Negative aside from HPI  Genitourinary:       Neg aside from HPI   Musculoskeletal:       Per HPI, otherwise negative  Skin: Positive for color change.  Allergic/Immunologic: Positive for immunocompromised state.  Neurological: Positive for weakness. Negative for syncope.  Hematological: Bruises/bleeds easily.     Physical Exam Updated Vital Signs BP (!) 115/37   Pulse (!) 104   Temp 97.9 F (36.6 C) (Oral)  Resp 14   Ht 5\' 2"  (1.575 m)   Wt 82.6 kg (182 lb)   LMP  (LMP Unknown)   SpO2 98%   BMI 33.29 kg/m   Physical Exam  Constitutional: She is oriented to person, place, and time. She appears ill. No distress.  HENT:  Head: Normocephalic and atraumatic.  Eyes: Conjunctivae and EOM are normal.  Cardiovascular: Regular rhythm. Tachycardia present.  Pulmonary/Chest: Effort normal and breath sounds normal. No stridor. No respiratory distress.  Abdominal: She exhibits no distension.    Musculoskeletal: She exhibits no edema.  Neurological: She is alert and oriented to person, place, and time. No cranial nerve deficit.  Skin: Skin is warm and dry. There is pallor.  Jaundice, pale  Psychiatric: She has a normal mood and affect.  Nursing note and vitals reviewed.    ED Treatments / Results  Labs (all labs ordered are listed, but only abnormal results are displayed) Labs Reviewed  COMPREHENSIVE METABOLIC PANEL - Abnormal; Notable for the following components:      Result Value   Sodium 128 (*)    Chloride 92 (*)    Glucose, Bld 229 (*)    BUN 57 (*)    Creatinine, Ser 2.00 (*)    Calcium 8.2 (*)    Total Protein 5.6 (*)    Albumin 2.4 (*)    AST 67 (*)     Alkaline Phosphatase 178 (*)    Total Bilirubin 4.0 (*)    GFR calc non Af Amer 25 (*)    GFR calc Af Amer 29 (*)    All other components within normal limits  CBC WITH DIFFERENTIAL/PLATELET - Abnormal; Notable for the following components:   WBC 19.1 (*)    Hemoglobin 9.0 (*)    HCT 29.7 (*)    MCV 74.6 (*)    MCH 22.6 (*)    RDW 19.3 (*)    Platelets 124 (*)    Neutro Abs 16.3 (*)    Monocytes Absolute 1.4 (*)    All other components within normal limits  LIPASE, BLOOD - Abnormal; Notable for the following components:   Lipase 64 (*)    All other components within normal limits  I-STAT CG4 LACTIC ACID, ED - Abnormal; Notable for the following components:   Lactic Acid, Venous 4.53 (*)    All other components within normal limits  URINALYSIS, ROUTINE W REFLEX MICROSCOPIC  BODY FLUID CELL COUNT WITH DIFFERENTIAL  PATHOLOGIST SMEAR REVIEW  I-STAT CG4 LACTIC ACID, ED    Radiology US Paracentesis  Result Date: 07/08/2018 INDICATION: Cirrhosis, ascites EXAM: ULTRASOUND GUIDED DIAGNOSTIC AND THERAPEUTIC PARACENTESIS MEDICATIONS: None. COMPLICATIONS: None immediate. PROCEDURE: Procedure, benefits, and risks of procedure were discussed with patient. Written informed consent for procedure was obtained. Time out protocol followed. Adequate collection of ascites localized by ultrasound in LEFT lower quadrant. Skin prepped and draped in usual sterile fashion. Skin and soft tissues anesthetized with 10 mL of 1% lidocaine. 5 [place] Yueh catheter placed into peritoneal cavity. 3.7 L of cloudy yellow ascitic fluid aspirated by vacuum bottle suction. Procedure tolerated well by patient without immediate complication. FINDINGS: A total of approximately 3.7 L of ascitic fluid was removed. Samples were sent to the laboratory as requested by the clinical team. IMPRESSION: Successful ultrasound-guided paracentesis yielding 3.7 liters of peritoneal fluid. Electronically Signed   By: Lavonia Dana M.D.   On:  07/08/2018 11:35   Dg Chest Port 1 View  Result Date: 07/08/2018 CLINICAL DATA:  Dyspnea  and chills EXAM: PORTABLE CHEST 1 VIEW COMPARISON:  07/02/2018 FINDINGS: Right-sided airspace disease centered over the upper lobe. Low volume chest. Status post aortic valve replacement and mitral valve replacement. No visible effusion or cavitation. IMPRESSION: Right-sided pneumonia. Electronically Signed   By: Monte Fantasia M.D.   On: 07/08/2018 10:00    Procedures Procedures (including critical care time)  Medications Ordered in ED Medications  ceFEPIme (MAXIPIME) 1 g in sodium chloride 0.9 % 100 mL IVPB (1 g Intravenous New Bag/Given 07/08/18 1150)  vancomycin (VANCOCIN) 1,750 mg in sodium chloride 0.9 % 500 mL IVPB (has no administration in time range)     Initial Impression / Assessment and Plan / ED Course  I have reviewed the triage vital signs and the nursing notes.  Pertinent labs & imaging results that were available during my care of the patient were reviewed by me and considered in my medical decision making (see chart for details).   Initial labs notable for lactic acidosis. After the initial evaluation with concern for patient's dyspnea, ascites, she had ultrasound paracentesis ordered. X-ray, labs also ordered.   11:56 AM She has returned from paracentesis, no longer has oxygen requirement looks better, states that she feels better.  When she remains tachycardic, but normotensive. Initial labs notable for lactic acidosis, renal failure, persistent hepatic dysfunction. X-ray consistent with pneumonia. Patient started antibiotics.  12:28 PM Discussed patient's case with representative's of her transplant team at [facility]. Patient has been accepted in transfer. Patient has begun antibiotic therapy, will receive fluid resuscitation given her acute kidney injury.  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 Patient had successful paracentesis in the emergency department, labs notable for acute kidney injury, leukocytosis, continued hepatic dysfunction. X-ray consistent with pneumonia. Patient required transfer to tertiary care center.  Patient accepted by Dr. Illene Labrador Final Clinical Impressions(s) / ED Diagnoses  [facility] acquired pneumonia Acute kidney injury   CRITICAL CARE Performed by: Carmin Muskrat Total critical care time: 35 minutes Critical care time was exclusive of separately billable procedures and treating other patients. Critical care was necessary to treat or prevent imminent or life-threatening deterioration. Critical care was time spent personally by me on the following activities: development of treatment plan with patient and/or surrogate as well as nursing, discussions with consultants, evaluation of patient's response to treatment, examination of patient, obtaining history from patient or surrogate, ordering and performing treatments and interventions, ordering and review of laboratory studies, ordering and review of radiographic studies, pulse oximetry and re-evaluation of patient's condition.    Carmin Muskrat, MD 07/08/18 1230

## 2018-07-08 NOTE — ED Notes (Signed)
Patient complained of chest pain. Aching and burning. Notified Dr. Alvino Chapel. Waiting orders

## 2018-07-08 NOTE — ED Triage Notes (Signed)
Pt was in cardiac rehab today and started chills and shaking. Pt states she was very cold. [facility] was called. Is scheduled for a liver transplant but has not had transplant yet. Is alert and oriented.

## 2018-07-08 NOTE — ED Notes (Signed)
Pt left leg weeping, EDP aware and at the bedside to assess. Family at the bedside, pt pulled up in bed to make more comfortable.

## 2018-07-08 NOTE — Consult Note (Addendum)
[facility]ise C[facility] Medical Consultation  Denise Cuevas GMW:[id number] DOB: 31-Mar-1951 DOA: 07/08/2018 PCP: Dineen Kid, MD   Requesting physician: Davonna Belling, MD Date of consultation: 07/08/2018. Reason for consultation: Pneumonia.  Impression/Recommendations Principal Problem:   HCAP (healthcare-associated pneumonia) Continue supplemental oxygen. Scheduled and as needed bronchodilators. Continue cefepime per pharmacy. Continue vancomycin per pharmacy. Check sputum Gram stain, culture and sensitivity. Check strep pneumonia urinary antigen.  Active Problems:   Lactic acidosis Continue IV antibiotics. Continue gentle IV hydration. Follow up lactic acid level.    Type 2 diabetes mellitus, uncontrolled (HCC) Continue 75/25 insulin mix twice a day. CBG monitoring with regular insulin sliding scale.    Cirrhosis of liver not due to alcohol (HCC) MELD score of 26 with a 3 month mortality rate of 19.6%. Scheduled for transfer to [facility]. Poor prognosis without liver transplantation.     AKI (acute kidney injury) ([place]) In the setting of HCAP, diuretic use and liver failure.  Monitor intake and output. Continue gentle IVF. Monitor renal function and electrolytes    Hypothyroidism Continue levothyroxine 275 mcg p.o. daily. Check TSH as needed.    CAD (coronary artery disease) Denies chest pain at this time. Not on beta-blocker, antiplatelet or statin due to comorbidities.    Depression Continue Cymbalta 90 mg p.o. daily.    GERD (gastroesophageal reflux disease) Protonix 40 mg p.o. daily.    Essential hypertension Hold furosemide and Spironolactone due to hypotension.    Anemia Monitor hematocrit and hemoglobin.    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   I will followup again tomorrow. Please contact me if I can be of assistance in the meanwhile. Thank you for this consultation.  Chief Complaint: Chills  and shaking.  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  No sore throat, but complains of occasionally productive cough for a week, associated with increased dyspnea and wheezing. Denies sick contacts or travel history.  No chest pain, palpitations, dizziness or diaphoresis.  Complains of occasional orthopnea, chronic lower extremity and abdomen edema.  Positive diffuse abdominal pain, occasional nausea, frequent loose stools, no melena or hematochezia.  Denies dysuria, frequency or hematuria.  No polyuria, polydipsia or polyphagia.  She bruises easily.  Review of Systems:  As above mentioned.  Past Medical History:  Diagnosis Date  . Angina   . Asthma   . CHF (congestive heart failure) ([place])   . Cirrhosis of liver ([place]) 08/2014   idiopathic  . COPD (chronic obstructive pulmonary disease) ([place])   . Coronary artery disease    s/p CABG  . Depression   . Diabetes mellitus    type 2  . Dyspnea   . Edema extremities    consistent edema lower legs, feet, and right quadrant abdominal pain-"goes and comes"  . Family history of adverse reaction to anesthesia    SISTER HAS NAUSEA  . Fibromyalgia   . GERD (gastroesophageal reflux disease)   . H/O hiatal hernia   . Headache(784.0)    occ. generalized  . Heart murmur   . Hypercholesteremia   . Hypertension   . Hypothyroid   . Myocardial infarction ([place])  11/2011   MI x2- 1'69,6'78 coronary stent(chest pain episode at time)  . PONV (postoperative nausea and vomiting)    Past Surgical History:  Procedure Laterality Date   . AORTIC VALVE REPLACEMENT N/A 11/12/2017   Procedure: AORTIC VALVE REPLACEMENT (AVR)  USING 19 MM MAGNA EASE PERICARDIAL BIOPROSTHESIS-AORTIC, MODEL 3300TFX;  Surgeon: [name], MD;  Location: [place];  Service: Open Heart Surgery;  Laterality: N/A;  . ARTERIAL LINE INSERTION Right 11/12/2017   Procedure: ARTERIAL LINE INSERTION;  Surgeon: [name], MD;  Location: [place];  Service: Open Heart Surgery;  Laterality: Right;  placed in right femoral artery.  . CHOLECYSTECTOMY     open many yrs ago  . COLONOSCOPY WITH PROPOFOL N/A 08/24/2014   Procedure: COLONOSCOPY WITH PROPOFOL;  Surgeon: Arta Silence, MD;  Location: [facility];  Service: Endoscopy;  Laterality: N/A;  . CORONARY ANGIOPLASTY WITH STENT PLACEMENT  05/06/2012  . CORONARY ARTERY BYPASS GRAFT  11/27/2011   Procedure: CORONARY ARTERY BYPASS GRAFTING (CABG);  Surgeon: [name], MD;  Location: [place];  Service: Open Heart Surgery;  Laterality: N/A;  coronary artery bypass graft on pump times two utilizing left internal mammary artery and right saphenous vein harvested endoscopically   . ESOPHAGOGASTRODUODENOSCOPY (EGD) WITH PROPOFOL N/A 08/24/2014   Procedure: ESOPHAGOGASTRODUODENOSCOPY (EGD) WITH PROPOFOL;  Surgeon: Arta Silence, MD;  Location: [facility];  Service: Endoscopy;  Laterality: N/A;  . ESOPHAGOGASTRODUODENOSCOPY (EGD) WITH PROPOFOL N/A 02/16/2018   Procedure: ESOPHAGOGASTRODUODENOSCOPY (EGD) WITH PROPOFOL;  Surgeon: Otis Brace, MD;  Location: [facility];  Service: Gastroenterology;  Laterality: N/A;  . KNEE SURGERY Right    scope surgery  . LEFT HEART CATHETERIZATION WITH CORONARY ANGIOGRAM N/A 11/19/2011   Procedure: LEFT HEART CATHETERIZATION WITH CORONARY ANGIOGRAM;  Surgeon: Candee Furbish, MD;  Location: [facility] CATH LAB;  Service: Cardiovascular;  Laterality: N/A;  . LEFT HEART CATHETERIZATION WITH CORONARY ANGIOGRAM  05/06/2012   Procedure: LEFT HEART CATHETERIZATION WITH CORONARY ANGIOGRAM;   Surgeon: Jettie Booze, MD;  Location: [facility] CATH LAB;  Service: Cardiovascular;;  . LEFT HEART CATHETERIZATION WITH CORONARY/GRAFT ANGIOGRAM N/A 03/24/2012   Procedure: LEFT HEART CATHETERIZATION WITH Beatrix Fetters;  Surgeon: Jettie Booze, MD;  Location: [facility] CATH LAB;  Service: Cardiovascular;  Laterality: N/A;  . MITRAL VALVE REPLACEMENT N/A 11/12/2017   Procedure: MITRAL VALVE (MV) REPLACEMENT USING 25 MM MAGNA MITRAL EASE PERICARDIAL BIOPROSTHESIS, MODEL 7300TFX;  Surgeon: [name], MD;  Location: [place];  Service: Open Heart Surgery;  Laterality: N/A;  Using 77mm Magna Ease Mitral Pericardial Bioprosthesis  . PERCUTANEOUS CORONARY STENT INTERVENTION (PCI-S)  03/24/2012   Procedure: PERCUTANEOUS CORONARY STENT INTERVENTION (PCI-S);  Surgeon: Jettie Booze, MD;  Location: [facility] CATH LAB;  Service: Cardiovascular;;  . PERCUTANEOUS CORONARY STENT INTERVENTION (PCI-S) Bilateral 05/06/2012   Procedure: PERCUTANEOUS CORONARY STENT INTERVENTION (PCI-S);  Surgeon: Jettie Booze, MD;  Location: [facility] CATH LAB;  Service: Cardiovascular;  Laterality: Bilateral;  . RIGHT/LEFT HEART CATH AND CORONARY/GRAFT ANGIOGRAPHY N/A 10/20/2017   Procedure: RIGHT/LEFT HEART CATH AND CORONARY/GRAFT ANGIOGRAPHY;  Surgeon: Burnell Blanks, MD;  Location: [place] CV LAB;  Service: Cardiovascular;  Laterality: N/A;  . TEE WITHOUT CARDIOVERSION N/A 10/20/2017   Procedure: TRANSESOPHAGEAL ECHOCARDIOGRAM (TEE);  Surgeon: Dorothy Spark, MD;  Location: [facility] ENDOSCOPY;  Service: Cardiovascular;  Laterality: N/A;  . TEE WITHOUT CARDIOVERSION N/A 11/12/2017   Procedure: TRANSESOPHAGEAL ECHOCARDIOGRAM (TEE);  Surgeon: [name], MD;  Location: [place];  Service: Open Heart Surgery;  Laterality: N/A;  Social History:  reports that she has never smoked. She has never used smokeless tobacco. She reports that she does not drink alcohol or use drugs.  Allergies  Allergen Reactions   . Exenatide Nausea And Vomiting  . Ace Inhibitors Other (See Comments)    Pseudoasthma. Pharmacy asked about this and patient does not recall so not sure if true allergy   Family History  Problem Relation Age of Onset  . Heart disease Father 64       died of MI  . Emphysema Father        smoked  . Peripheral vascular disease Mother 42       bilaterial amputations  . Breast cancer Neg Hx     Prior to Admission medications   Medication Sig Start Date End Date Taking? Authorizing Provider  albuterol (PROVENTIL HFA;VENTOLIN HFA) 108 (90 BASE) MCG/ACT inhaler Inhale 2 puffs into the lungs every 6 (six) hours as needed for wheezing or shortness of breath. 02/04/15  Yes Ghimire, Henreitta Leber, MD  albuterol (PROVENTIL) (2.5 MG/3ML) 0.083% nebulizer solution Take 3 mLs (2.5 mg total) by nebulization every 2 (two) hours as needed for wheezing. 02/04/15  Yes Ghimire, Henreitta Leber, MD  DULoxetine (CYMBALTA) 30 MG capsule Take 30 mg by mouth daily with lunch. In conjunction with one 60 mg capsule to equal a total of 90 milligrams   Yes [provider]  DULoxetine (CYMBALTA) 60 MG capsule Take 60 mg by mouth daily with lunch. In conjunction with one 30 mg capsule to equal a total of 90 milligrams   Yes [provider]  furosemide (LASIX) 40 MG tablet Take 40 mg by mouth daily.   Yes [provider]  HUMALOG MIX 75/25 KWIKPEN (75-25) 100 UNIT/ML Kwikpen Inject 25 Units into the skin 2 (two) times daily. 02/20/18  Yes Nita Sells, MD  KRISTALOSE 20 g packet Take 20 g by mouth 3 (three) times daily. 01/12/18  Yes [provider]  levofloxacin (LEVAQUIN) 750 MG tablet Take 1 tablet by mouth daily. 07/02/18  Yes [provider]  levothyroxine (SYNTHROID, LEVOTHROID) 50 MCG tablet TAKE 1 TABLET(50 MCG) BY MOUTH DAILY BEFORE BREAKFAST 03/06/18  Yes Jerline Pain, MD  mometasone-formoterol (DULERA) 100-5 MCG/ACT AERO Inhale 2 puffs into the lungs 2 (two) times daily as  needed for wheezing or shortness of breath.   Yes [provider]  omeprazole-sodium bicarbonate (ZEGERID) 40-1100 MG per capsule Take 1 capsule by mouth daily before breakfast.   Yes [provider]  spironolactone (ALDACTONE) 25 MG tablet Take 1 tablet (25 mg total) by mouth daily. 11/23/16  Yes Hongalgi, Lenis Dickinson, MD  XIFAXAN 550 MG TABS tablet Take 550 mg by mouth 2 (two) times daily. 01/20/18  Yes [provider]  methocarbamol (ROBAXIN) 750 MG tablet Take 1 tablet by mouth as needed. 07/02/18   [provider]   Physical Exam: Blood pressure (!) 109/38, pulse (!) 102, temperature 97.9 F (36.6 C), temperature source Oral, resp. rate 18, height 5\' 2"  (1.575 m), weight 82.6 kg (182 lb), SpO2 99 %. Vitals:   07/08/18 2200 07/08/18 2230  BP: (!) 93/42 (!) 109/38  Pulse: 100 (!) 102  Resp: 15 18  Temp:    SpO2: 98% 99%     General: Looks chronically ill.  Eyes: PERRLA, mild icterus  ENT: Oral mucosa is moist.  Neck: Supple, no JVD.  Cardiovascular: S1 S2, tachycardic at 035 bpm, +0/0 systolic murmur, no rub or gallop.  Respiratory:  Decreased breath sounds on right lower lobe with bilateral rhonchi and wheezing.  Abdomen: Distended, anasarca, mild diffuse tenderness, no guarding, no rebound.  Skin: Areas of ecchymosis on extremities.  Musculoskeletal: 2+ lower extremities edema, no clubbing, no cyanosis.  Psychiatric: Mildly depressed mood.  Neurologic: Generalized weakness, but grossly nonfocal.  Labs on Admission:  Basic Metabolic Panel: Recent Labs  Lab 07/08/18 0945  NA 128*  K 4.0  CL 92*  CO2 25  GLUCOSE 229*  BUN 57*  CREATININE 2.00*  CALCIUM 8.2*   Liver Function Tests: Recent Labs  Lab 07/08/18 0945  AST 67*  ALT 31  ALKPHOS 178*  BILITOT 4.0*  PROT 5.6*  ALBUMIN 2.4*   Recent Labs  Lab 07/08/18 0945  LIPASE 64*   No results for input(s): AMMONIA in the last 168 hours. CBC: Recent Labs  Lab  07/08/18 0945  WBC 19.1*  NEUTROABS 16.3*  HGB 9.0*  HCT 29.7*  MCV 74.6*  PLT 124*   Cardiac Enzymes: No results for input(s): CKTOTAL, CKMB, CKMBINDEX, TROPONINI in the last 168 hours. BNP: Invalid input(s): POCBNP CBG: No results for input(s): GLUCAP in the last 168 hours.  Radiological Exams on Admission: US Paracentesis  Result Date: 07/08/2018 INDICATION: Cirrhosis, ascites EXAM: ULTRASOUND GUIDED DIAGNOSTIC AND THERAPEUTIC PARACENTESIS MEDICATIONS: None. COMPLICATIONS: None immediate. PROCEDURE: Procedure, benefits, and risks of procedure were discussed with patient. Written informed consent for procedure was obtained. Time out protocol followed. Adequate collection of ascites localized by ultrasound in LEFT lower quadrant. Skin prepped and draped in usual sterile fashion. Skin and soft tissues anesthetized with 10 mL of 1% lidocaine. 5 [place] Yueh catheter placed into peritoneal cavity. 3.7 L of cloudy yellow ascitic fluid aspirated by vacuum bottle suction. Procedure tolerated well by patient without immediate complication. FINDINGS: A total of approximately 3.7 L of ascitic fluid was removed. Samples were sent to the laboratory as requested by the clinical team. IMPRESSION: Successful ultrasound-guided paracentesis yielding 3.7 liters of peritoneal fluid. Electronically Signed   By: Lavonia Dana M.D.   On: 07/08/2018 11:35   Dg Chest Port 1 View  Result Date: 07/08/2018 CLINICAL DATA:  Dyspnea and chills EXAM: PORTABLE CHEST 1 VIEW COMPARISON:  07/02/2018 FINDINGS: Right-sided airspace disease centered over the upper lobe. Low volume chest. Status post aortic valve replacement and mitral valve replacement. No visible effusion or cavitation. IMPRESSION: Right-sided pneumonia. Electronically Signed   By: Monte Fantasia M.D.   On: 07/08/2018 10:00    EKG: Independently reviewed.  Vent. rate 100 BPM PR interval * ms QRS duration 90 ms QT/QTc 378/488 ms P-R-T axes 61 -1 53 Sinus  tachycardia Ventricular premature complex Low voltage, precordial leads Borderline prolonged QT interval  Time spent: Over 55 minutes were spent during the process of this consultation.  Reubin Milan [facility] Pager [phone].  If 7PM-7AM, please contact night-coverage [email or web address] Password [facility] 07/08/2018, 11:55 PM

## 2018-07-08 NOTE — ED Notes (Signed)
Pt ambulatory to the bathroom with assistance, bilateral leg edema, pitting has increased, left leg continues to weep. Bed changed and pad placed. Pt voided without difficulty.

## 2018-07-08 NOTE — Progress Notes (Signed)
Was present for emotional and spiritual support through out the day, supporting family and Ms Kaiser.

## 2018-07-08 NOTE — ED Notes (Signed)
CRITICAL VALUE ALERT  Critical Value:  4.53 lactic  Date & Time Notied:  07-08-18 0958   Provider Notified: [place]  Orders Received/Actions taken: continue to monitor

## 2018-07-08 NOTE — Progress Notes (Signed)
Patient did not exercise on this day d/t having what appeared to be a panic attack, with shaking, trembling. BS 244. Patient was taken to ER for observation.  Denise Cuevas [employer]

## 2018-07-08 NOTE — ED Notes (Signed)
Patient rested in bed, requested something to eat, given per request

## 2018-07-09 DIAGNOSIS — D649 Anemia, unspecified: Secondary | ICD-10-CM | POA: Diagnosis present

## 2018-07-09 DIAGNOSIS — J189 Pneumonia, unspecified organism: Secondary | ICD-10-CM | POA: Diagnosis present

## 2018-07-09 DIAGNOSIS — N179 Acute kidney failure, unspecified: Secondary | ICD-10-CM | POA: Diagnosis present

## 2018-07-09 DIAGNOSIS — E872 Acidosis, unspecified: Secondary | ICD-10-CM | POA: Diagnosis present

## 2018-07-09 DIAGNOSIS — D696 Thrombocytopenia, unspecified: Secondary | ICD-10-CM | POA: Diagnosis present

## 2018-07-09 LAB — COMPREHENSIVE METABOLIC PANEL
ALT: 28 U/L (ref 0–44)
AST: 68 U/L — AB (ref 15–41)
Albumin: 1.9 g/dL — ABNORMAL LOW (ref 3.5–5.0)
Alkaline Phosphatase: 148 U/L — ABNORMAL HIGH (ref 38–126)
Anion gap: 7 (ref 5–15)
BUN: 55 mg/dL — AB (ref 8–23)
CHLORIDE: 93 mmol/L — AB (ref 98–111)
CO2: 26 mmol/L (ref 22–32)
CREATININE: 1.86 mg/dL — AB (ref 0.44–1.00)
Calcium: 7.6 mg/dL — ABNORMAL LOW (ref 8.9–10.3)
GFR calc Af Amer: 32 mL/min — ABNORMAL LOW (ref >60)
GFR calc non Af Amer: 28 mL/min — ABNORMAL LOW (ref >60)
Glucose, Bld: 285 mg/dL — ABNORMAL HIGH (ref 70–99)
POTASSIUM: 4 mmol/L (ref 3.5–5.1)
SODIUM: 126 mmol/L — AB (ref 135–145)
Total Bilirubin: 3.3 mg/dL — ABNORMAL HIGH (ref 0.3–1.2)
Total Protein: 4.7 g/dL — ABNORMAL LOW (ref 6.5–8.1)

## 2018-07-09 LAB — HEPATIC FUNCTION PANEL
ALK PHOS: 149 U/L — AB (ref 38–126)
ALT: 27 U/L (ref 0–44)
AST: 65 U/L — AB (ref 15–41)
Albumin: 1.9 g/dL — ABNORMAL LOW (ref 3.5–5.0)
BILIRUBIN DIRECT: 1.8 mg/dL — AB (ref 0.0–0.2)
Indirect Bilirubin: 1.7 mg/dL — ABNORMAL HIGH (ref 0.3–0.9)
Total Bilirubin: 3.5 mg/dL — ABNORMAL HIGH (ref 0.3–1.2)
Total Protein: 4.6 g/dL — ABNORMAL LOW (ref 6.5–8.1)

## 2018-07-09 LAB — CBC WITH DIFFERENTIAL/PLATELET
BASOS ABS: 0 10*3/uL (ref 0.0–0.1)
Basophils Absolute: 0 10*3/uL (ref 0.0–0.1)
Basophils Relative: 0 %
Basophils Relative: 0 %
EOS PCT: 1 %
EOS PCT: 1 %
Eosinophils Absolute: 0.1 10*3/uL (ref 0.0–0.7)
Eosinophils Absolute: 0.2 10*3/uL (ref 0.0–0.7)
HCT: 24.6 % — ABNORMAL LOW (ref 36.0–46.0)
HCT: 24.6 % — ABNORMAL LOW (ref 36.0–46.0)
HEMOGLOBIN: 7.6 g/dL — AB (ref 12.0–15.0)
Hemoglobin: 7.6 g/dL — ABNORMAL LOW (ref 12.0–15.0)
LYMPHS ABS: 1.7 10*3/uL (ref 0.7–4.0)
LYMPHS PCT: 11 %
Lymphocytes Relative: 13 %
Lymphs Abs: 2.1 10*3/uL (ref 0.7–4.0)
MCH: 22.9 pg — AB (ref 26.0–34.0)
MCH: 22.9 pg — ABNORMAL LOW (ref 26.0–34.0)
MCHC: 30.9 g/dL (ref 30.0–36.0)
MCHC: 30.9 g/dL (ref 30.0–36.0)
MCV: 74.1 fL — AB (ref 78.0–100.0)
MCV: 74.1 fL — AB (ref 78.0–100.0)
MONO ABS: 1.6 10*3/uL — AB (ref 0.1–1.0)
Monocytes Absolute: 1.8 10*3/uL — ABNORMAL HIGH (ref 0.1–1.0)
Monocytes Relative: 10 %
Monocytes Relative: 11 %
NEUTROS ABS: 11.7 10*3/uL — AB (ref 1.7–7.7)
NEUTROS PCT: 75 %
Neutro Abs: 12 10*3/uL — ABNORMAL HIGH (ref 1.7–7.7)
Neutrophils Relative %: 78 %
PLATELETS: 70 10*3/uL — AB (ref 150–400)
PLATELETS: 74 10*3/uL — AB (ref 150–400)
RBC: 3.32 MIL/uL — AB (ref 3.87–5.11)
RBC: 3.32 MIL/uL — ABNORMAL LOW (ref 3.87–5.11)
RDW: 19.4 % — AB (ref 11.5–15.5)
RDW: 19.4 % — ABNORMAL HIGH (ref 11.5–15.5)
WBC: 15.5 10*3/uL — ABNORMAL HIGH (ref 4.0–10.5)
WBC: 15.7 10*3/uL — AB (ref 4.0–10.5)

## 2018-07-09 LAB — I-STAT CG4 LACTIC ACID, ED: Lactic Acid, Venous: 2.12 mmol/L (ref 0.5–1.9)

## 2018-07-09 LAB — LACTIC ACID, PLASMA
Lactic Acid, Venous: 1.7 mmol/L (ref 0.5–1.9)
Lactic Acid, Venous: 2.3 mmol/L (ref 0.5–1.9)

## 2018-07-09 LAB — CBG MONITORING, ED
Glucose-Capillary: 269 mg/dL — ABNORMAL HIGH (ref 70–99)
Glucose-Capillary: 156 mg/dL — ABNORMAL HIGH (ref 70–99)
Glucose-Capillary: 161 mg/dL — ABNORMAL HIGH (ref 70–99)

## 2018-07-09 LAB — BASIC METABOLIC PANEL
ANION GAP: 4 — AB (ref 5–15)
BUN: 55 mg/dL — ABNORMAL HIGH (ref 8–23)
CHLORIDE: 94 mmol/L — AB (ref 98–111)
CO2: 30 mmol/L (ref 22–32)
Calcium: 7.7 mg/dL — ABNORMAL LOW (ref 8.9–10.3)
Creatinine, Ser: 1.74 mg/dL — ABNORMAL HIGH (ref 0.44–1.00)
GFR calc Af Amer: 35 mL/min — ABNORMAL LOW (ref >60)
GFR, EST NON AFRICAN AMERICAN: 30 mL/min — AB (ref >60)
GLUCOSE: 188 mg/dL — AB (ref 70–99)
POTASSIUM: 4.3 mmol/L (ref 3.5–5.1)
Sodium: 128 mmol/L — ABNORMAL LOW (ref 135–145)

## 2018-07-09 LAB — CK
CK TOTAL: 26 U/L — AB (ref 38–234)
CK TOTAL: 28 U/L — AB (ref 38–234)

## 2018-07-09 LAB — PROTIME-INR
INR: 1.45
Prothrombin Time: 17.5 seconds — ABNORMAL HIGH (ref 11.4–15.2)

## 2018-07-09 MED ORDER — VANCOMYCIN HCL 10 G IV SOLR
1250.0000 mg | INTRAVENOUS | Status: DC
  Filled 2018-07-09: qty 1250

## 2018-07-09 MED ORDER — ALBUTEROL SULFATE (2.5 MG/3ML) 0.083% IN NEBU: 2.5000 mg | INHALATION_SOLUTION | RESPIRATORY_TRACT | Status: DC | PRN

## 2018-07-09 MED ORDER — INSULIN ASPART 100 UNIT/ML ~~LOC~~ SOLN
5.0000 [IU] | Freq: Once | SUBCUTANEOUS | Status: AC
  Administered 2018-07-09: 5 [IU] via SUBCUTANEOUS

## 2018-07-09 MED ORDER — RIFAXIMIN 550 MG PO TABS
550.0000 mg | ORAL_TABLET | Freq: Two times a day (BID) | ORAL | Status: DC
  Administered 2018-07-09 (×2): 550 mg via ORAL
  Filled 2018-07-09 (×5): qty 1

## 2018-07-09 MED ORDER — DULOXETINE HCL 30 MG PO CPEP
60.0000 mg | ORAL_CAPSULE | Freq: Every day | ORAL | Status: DC
  Administered 2018-07-09: 60 mg via ORAL
  Filled 2018-07-09: qty 2

## 2018-07-09 MED ORDER — SODIUM CHLORIDE 0.9 % IV SOLN
1.0000 g | INTRAVENOUS | Status: DC
  Administered 2018-07-09 (×2): 1 g via INTRAVENOUS
  Filled 2018-07-09 (×2): qty 1

## 2018-07-09 MED ORDER — SODIUM CHLORIDE 0.9 % IV SOLN
INTRAVENOUS | Status: DC
  Administered 2018-07-09: 09:00:00 via INTRAVENOUS

## 2018-07-09 MED ORDER — IPRATROPIUM-ALBUTEROL 0.5-2.5 (3) MG/3ML IN SOLN
3.0000 mL | Freq: Four times a day (QID) | RESPIRATORY_TRACT | Status: DC
  Administered 2018-07-09 (×2): 3 mL via RESPIRATORY_TRACT
  Filled 2018-07-09: qty 3

## 2018-07-09 MED ORDER — LEVOTHYROXINE SODIUM 50 MCG PO TABS
50.0000 ug | ORAL_TABLET | Freq: Every day | ORAL | Status: DC
  Administered 2018-07-09: 50 ug via ORAL
  Filled 2018-07-09: qty 1

## 2018-07-09 MED ORDER — DM-GUAIFENESIN ER 30-600 MG PO TB12
1.0000 | ORAL_TABLET | Freq: Two times a day (BID) | ORAL | Status: DC
  Administered 2018-07-09: 1 via ORAL
  Filled 2018-07-09: qty 1

## 2018-07-09 MED ORDER — BUDESONIDE 0.5 MG/2ML IN SUSP
0.5000 mg | Freq: Two times a day (BID) | RESPIRATORY_TRACT | Status: DC
  Filled 2018-07-09 (×4): qty 2

## 2018-07-09 MED ORDER — IPRATROPIUM-ALBUTEROL 0.5-2.5 (3) MG/3ML IN SOLN
RESPIRATORY_TRACT | Status: AC
  Filled 2018-07-09: qty 3

## 2018-07-09 MED ORDER — INSULIN ASPART 100 UNIT/ML ~~LOC~~ SOLN
0.0000 [IU] | Freq: Three times a day (TID) | SUBCUTANEOUS | Status: DC
  Administered 2018-07-09 (×2): 3 [IU] via SUBCUTANEOUS
  Filled 2018-07-09: qty 1

## 2018-07-09 MED ORDER — PANTOPRAZOLE SODIUM 40 MG PO TBEC
40.0000 mg | DELAYED_RELEASE_TABLET | Freq: Every day | ORAL | Status: DC
  Administered 2018-07-09 (×2): 40 mg via ORAL
  Filled 2018-07-09 (×2): qty 1

## 2018-07-09 MED ORDER — DULOXETINE HCL 30 MG PO CPEP
30.0000 mg | ORAL_CAPSULE | Freq: Every day | ORAL | Status: DC
  Administered 2018-07-09: 30 mg via ORAL
  Filled 2018-07-09: qty 1

## 2018-07-09 NOTE — Progress Notes (Signed)
Pharmacy Antibiotic Note  Denise Cuevas is a 67 y.o. female admitted on 07/08/2018 with pneumonia.  Pharmacy has been consulted for Vancomycin and cefepime dosing.  Plan: Vancomycin 1750mg  IV loading dose given in ED, then 1250mg  IV every 24 hours.  Goal trough 15-20 mcg/mL.  Cefepime 1gm IV q24h F/U cxs and clinical progress Monitor V/S, labs and levels as indicated  Height: 5\' 2"  (157.5 cm) Weight: 182 lb (82.6 kg) IBW/kg (Calculated) : 50.1  No data recorded.  Recent Labs  Lab 07/08/18 0945 07/08/18 0955 07/09/18 0004 07/09/18 0005 07/09/18 0339 07/09/18 0755  WBC 19.1*  --  15.5*  --   --  15.7*  CREATININE 2.00*  --  1.86*  --   --  1.74*  LATICACIDVEN  --  4.53* 2.3* 2.12* 1.7  --     Estimated Creatinine Clearance: 32.5 mL/min (A) (by C-G formula based on SCr of 1.74 mg/dL (H)).    Allergies  Allergen Reactions  . Exenatide Nausea And Vomiting  . Ace Inhibitors Other (See Comments)    Pseudoasthma. Pharmacy asked about this and patient does not recall so not sure if true allergy    Antimicrobials this admission: Vancomycin 7/31 >>  Cefepime 7/31 >>   Dose adjustments this admission: 8/1 Cefepime changed to 1gm IV q24h 8/1 Vancomycin changed 1250mg  IV q24h  Microbiology results:   BCx:  7/31 ascitic fluid Cx: no growth  MRSA PCR:   Thank you for allowing pharmacy to be a part of this patient's care.  Isac Sarna, BS Pharm D, [place] Clinical Pharmacist Pager [phone] 07/09/2018 11:54 AM

## 2018-07-09 NOTE — ED Notes (Addendum)
See critical value note, Dr. Olevia Bowens notified

## 2018-07-09 NOTE — ED Notes (Signed)
Pt ambulatory to restroom

## 2018-07-09 NOTE — Progress Notes (Signed)
PHARMACY NOTE:  ANTIMICROBIAL RENAL DOSAGE ADJUSTMENT  Current antimicrobial regimen includes a mismatch between antimicrobial dosage and estimated renal function.  As per policy approved by the Pharmacy & Therapeutics and Medical Executive Committees, the antimicrobial dosage will be adjusted accordingly.  Current antimicrobial dosage:  cefepime 1 gram Q8 hours  Indication: HCAP  Renal Function:  Estimated Creatinine Clearance: 30.4 mL/min (A) (by C-G formula based on SCr of 1.86 mg/dL (H)). []      On intermittent HD, scheduled: []      On CRRT    Antimicrobial dosage has been changed to:  Cefepime 1 gram Q24 hours  Additional comments:   Thank you for allowing pharmacy to be a part of this patient's care.  Nyra Capes, [facility] 07/09/2018 12:43 AM

## 2018-07-09 NOTE — ED Notes (Signed)
RT and phlebotomist at bedside

## 2018-07-09 NOTE — Progress Notes (Signed)
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  She is noted to have history of liver cirrhosis and is being followed at [facility] for consideration of liver transplantation and is currently awaiting transfer.  She is noted to have healthcare associated pneumonia with infiltrate noted to the right lower lobe for which she has been initiated on cefepime and vancomycin.  She was also noted to have lactic acidosis as well as AKI which are both improving with gentle IV fluid.  She has undergone paracentesis of 3.7 L thus far and states that overall she is feeling somewhat better this morning.  Assessment & Plan:   Principal Problem:   HCAP (healthcare-associated pneumonia) Active Problems:   Type 2 diabetes mellitus, uncontrolled ([place])   Hypothyroidism   CAD (coronary artery disease)   Depression   GERD (gastroesophageal reflux disease)   Essential hypertension   Cirrhosis of liver not due to alcohol (HCC)   Anemia   Thrombocytopenia (HCC)   AKI (acute kidney injury) (HCC)   Lactic acidosis   1. HCAP of right lower lobe.  Continue vancomycin and cefepime as ordered with sputum Gram stain, cultures, and strep pneumonia urinary antigen pending.  We will also add Legionella antigen on account of hyponatremia.  Mucinex as needed. 2. Lactic acidosis.  Continue IV antibiotics and gentle IV hydration with normal saline and repeat lactic acid level in a.m.  This is slowly improving. 3. Type 2 diabetes with improved  control.  Continue 75/25 insulin mix as well as sliding scale insulin with routine CBG monitoring on carb modified diet. 4. Liver cirrhosis.  Meld score of 26 with follow-up regarding liver transplantation at [facility].  Patient currently awaiting transfer.  Patient has received paracentesis while here of 3.7 L fluid removed with no findings on Gram stain noted. 5. AKI.  This is improving and is noted to be in the setting of H CAP and diuretic use.  Continue gentle IV fluid and monitor repeat renal panel and avoid nephrotoxic agents. 6. Hypothyroidism.  Continue levothyroxine daily. 7. CAD.  No chest pain noted, continue to follow. 8. Depression.  Continue Cymbalta 90 mg p.o. daily. 9. GERD.  Protonix daily. 10. Essential hypertension.  Patient having soft blood pressure readings at this time and will hold furosemide and spironolactone on account of this. 11. Anemia.  Currently appears to be stable with no overt bleeding continue to monitor with repeat CBC. 12. Thrombocytopenia.  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   DVT prophylaxis:SCDs Code Status: Full Family Communication: Husband and sister at bedside Disposition Plan: Plan for transfer to [facility] once bed available to be evaluated by liver transplant team.  Continue current antibiotics with cefepime and vancomycin for HCAP treatment.   Consultants:   None  Procedures:   US guided Paracentesis 9/67 with no complications; 8.9F fluid removed  Antimicrobials:   Vancomycin and Cefepime 7/31->   Subjective: Patient seen and evaluated today with no new acute complaints or concerns. No acute concerns or events noted overnight.  Objective:  Vitals:   07/09/18 0700 07/09/18 0730 07/09/18 0800 07/09/18 0830  BP: (!) 98/35 (!) 100/41 (!) 97/41 (!) 100/41  Pulse: 87 88 90 93  Resp: 14 12 15 10   Temp:      TempSrc:      SpO2: 98% 97% 99% 100%  Weight:      Height:         Intake/Output Summary (Last 24 hours) at 07/09/2018 0854 Last data filed at 07/09/2018 0130 Gross per 24 hour  Intake 700 ml  Output -  Net 700 ml   Filed Weights   07/08/18 0918  Weight: 82.6 kg (182 lb)    Examination:  General exam: Appears calm and comfortable  Respiratory system: Clear to auscultation. Respiratory effort normal. 2L [place] Cardiovascular system: S1 & S2 heard, RRR. No JVD, murmurs, rubs, gallops or clicks. No pedal edema. Gastrointestinal system: Abdomen is minimally distended, soft and nontender. No organomegaly or masses felt. Normal bowel sounds heard. Central nervous system: Alert and oriented. No focal neurological deficits. Extremities: Symmetric 5 x 5 power. Skin: No rashes, lesions or ulcers Psychiatry: Judgement and insight appear normal. Mood & affect appropriate.     Data Reviewed: I have personally reviewed following labs and imaging studies  CBC: Recent Labs  Lab 07/08/18 0945 07/09/18 0004 07/09/18 0755  WBC 19.1* 15.5* 15.7*  NEUTROABS 16.3* 12.0* 11.7*  HGB 9.0* 7.6* 7.6*  HCT 29.7* 24.6* 24.6*  MCV 74.6* 74.1* 74.1*  PLT 124* 70* 74*   Basic Metabolic Panel: Recent Labs  Lab 07/08/18 0945 07/09/18 0004 07/09/18 0755  NA 128* 126* 128*  K 4.0 4.0 4.3  CL 92* 93* 94*  CO2 25 26 30   GLUCOSE 229* 285* 188*  BUN 57* 55* 55*  CREATININE 2.00* 1.86* 1.74*  CALCIUM 8.2* 7.6* 7.7*   GFR: Estimated Creatinine Clearance: 32.5 mL/min (A) (by C-G formula based on SCr of 1.74 mg/dL (H)). Liver Function Tests: Recent Labs  Lab 07/08/18 0945 07/09/18 0004 07/09/18 0755  AST 67* 68* 65*  ALT 31 28 27   ALKPHOS 178* 148* 149*  BILITOT 4.0* 3.3* 3.5*  PROT 5.6* 4.7* 4.6*  ALBUMIN 2.4* 1.9* 1.9*   Recent Labs  Lab 07/08/18 0945  LIPASE 64*   No results for input(s): AMMONIA in the last 168 hours. Coagulation Profile: Recent Labs  Lab 07/09/18 0004  INR 1.45   Cardiac Enzymes: Recent Labs  Lab 07/09/18 0004  07/09/18 0339  CKTOTAL 28* 26*   BNP (last 3 results) No results for input(s): PROBNP in the last 8760 hours. HbA1C: No results for input(s): HGBA1C in the last 72 hours. CBG: Recent Labs  Lab 07/09/18 0109 07/09/18 0816  GLUCAP 269* 156*   Lipid Profile: No results for input(s): CHOL, HDL, LDLCALC, TRIG, CHOLHDL, LDLDIRECT in the last 72 hours. Thyroid Function Tests: No results for input(s): TSH, T4TOTAL, FREET4, T3FREE, THYROIDAB in the last 72 hours. Anemia Panel: No results for input(s): VITAMINB12, FOLATE, FERRITIN, TIBC, IRON, RETICCTPCT in the last 72 hours. Sepsis Labs: Recent Labs  Lab 07/08/18 0955 07/09/18 0004 07/09/18 0005 07/09/18 0339  LATICACIDVEN 4.53* 2.3* 2.12* 1.7    Recent Results (from the past 240 hour(s))  Culture, body fluid-bottle     Status: None (Preliminary result)   Collection Time: 07/08/18 10:40 AM  Result Value Ref Range Status   Specimen Description ASCITIC  Final   Special Requests BOTTLES DRAWN AEROBIC AND ANAEROBIC 10 CC EACH  Final   Culture   Final  NO GROWTH < 24 HOURS Performed at [facility], [address], [place], [place] [address]    Report Status PENDING  Incomplete  Gram stain     Status: None   Collection Time: 07/08/18 10:40 AM  Result Value Ref Range Status   Specimen Description ASCITIC  Final   Special Requests NONE  Final   Gram Stain   Final    WBC PRESENT,BOTH PMN AND MONONUCLEAR NO ORGANISMS SEEN CYTOSPIN SMEAR Performed at [facility], [address] [place],  [address]    Report Status 07/08/2018 FINAL  Final         Radiology Studies: US Paracentesis  Result Date: 07/08/2018 INDICATION: Cirrhosis, ascites EXAM: ULTRASOUND GUIDED DIAGNOSTIC AND THERAPEUTIC PARACENTESIS MEDICATIONS: None. COMPLICATIONS: None immediate. PROCEDURE: Procedure, benefits, and risks of procedure were discussed with patient. Written informed consent for procedure was obtained. Time out protocol followed.  Adequate collection of ascites localized by ultrasound in LEFT lower quadrant. Skin prepped and draped in usual sterile fashion. Skin and soft tissues anesthetized with 10 mL of 1% lidocaine. 5 [place] Yueh catheter placed into peritoneal cavity. 3.7 L of cloudy yellow ascitic fluid aspirated by vacuum bottle suction. Procedure tolerated well by patient without immediate complication. FINDINGS: A total of approximately 3.7 L of ascitic fluid was removed. Samples were sent to the laboratory as requested by the clinical team. IMPRESSION: Successful ultrasound-guided paracentesis yielding 3.7 liters of peritoneal fluid. Electronically Signed   By: Lavonia Dana M.D.   On: 07/08/2018 11:35   Dg Chest Port 1 View  Result Date: 07/08/2018 CLINICAL DATA:  Dyspnea and chills EXAM: PORTABLE CHEST 1 VIEW COMPARISON:  07/02/2018 FINDINGS: Right-sided airspace disease centered over the upper lobe. Low volume chest. Status post aortic valve replacement and mitral valve replacement. No visible effusion or cavitation. IMPRESSION: Right-sided pneumonia. Electronically Signed   By: Monte Fantasia M.D.   On: 07/08/2018 10:00        Scheduled Meds: . budesonide (PULMICORT) nebulizer solution  0.5 mg Nebulization BID  . dextromethorphan-guaiFENesin  1 tablet Oral BID  . DULoxetine  30 mg Oral Q lunch  . DULoxetine  60 mg Oral Q lunch  . insulin aspart  0-15 Units Subcutaneous TID WC  . ipratropium-albuterol  3 mL Nebulization Q6H  . ipratropium-albuterol      . levothyroxine  50 mcg Oral QAC breakfast  . pantoprazole  40 mg Oral Daily  . rifaximin  550 mg Oral BID   Continuous Infusions: . sodium chloride    . ceFEPime (MAXIPIME) IV Stopped (07/09/18 0130)  . vancomycin       LOS: 0 days    Time spent: 30 minutes    Pratik Darleen Crocker, DO [facility] Pager [phone]  If 7PM-7AM, please contact night-coverage [email or web address] Password [facility] 07/09/2018, 8:54 AM

## 2018-07-09 NOTE — ED Notes (Signed)
Date and time results received: 07/09/18 0014 (use smartphrase ".now" to insert current time)  Test: lactic Critical Value: 2.12  Name of Provider Notified: Dr. Olevia Bowens  Orders Received? Or Actions Taken?: no/na

## 2018-07-09 NOTE — ED Notes (Addendum)
CBG monitoring documented for 0841 was not taken by this RN. This was taken last night and must have crossed over in the system this morning. This RN got a CBG of 156

## 2018-07-09 NOTE — ED Notes (Signed)
Date and time results received: 07/09/18 0036 (use smartphrase ".now" to insert current time)  Test: lactic Critical Value: 2.3  Name of Provider Notified: Dr. Olevia Bowens notified via [place] [facility] [place]? Or Actions Taken?: no/na

## 2018-07-10 ENCOUNTER — Ambulatory Visit (HOSPITAL_COMMUNITY): Payer: Medicare Other

## 2018-07-10 ENCOUNTER — Encounter (HOSPITAL_COMMUNITY): Payer: Medicare Other

## 2018-07-10 LAB — PATHOLOGIST SMEAR REVIEW

## 2018-07-13 ENCOUNTER — Ambulatory Visit (HOSPITAL_COMMUNITY): Payer: Medicare Other

## 2018-07-13 ENCOUNTER — Encounter (HOSPITAL_COMMUNITY): Payer: Medicare Other

## 2018-07-13 LAB — CULTURE, BODY FLUID W GRAM STAIN -BOTTLE

## 2018-07-13 LAB — CULTURE, BODY FLUID-BOTTLE: CULTURE: NO GROWTH

## 2018-07-15 ENCOUNTER — Ambulatory Visit (HOSPITAL_COMMUNITY): Payer: Medicare Other

## 2018-07-15 ENCOUNTER — Encounter (HOSPITAL_COMMUNITY): Payer: Medicare Other

## 2018-07-15 NOTE — Progress Notes (Signed)
Cardiac Individual Treatment Plan  Patient Details  Name: HIEN CUNLIFFE MRN: [id number] Date of Birth: 1951/08/29 Referring Provider:     [facility] from 05/05/2018 in [place]  Referring Provider  Dr. Servando Snare      Initial Encounter Date:    CARDIAC REHAB PHASE II ORIENTATION from 05/05/2018 in [place]  Date  05/05/18      Visit Diagnosis: S/P AVR  S/P MVR (mitral valve replacement)  Patient's Home Medications on Admission:  Current Outpatient Medications:  .  albuterol (PROVENTIL HFA;VENTOLIN HFA) 108 (90 BASE) MCG/ACT inhaler, Inhale 2 puffs into the lungs every 6 (six) hours as needed for wheezing or shortness of breath., Disp: 1 Inhaler, Rfl: 2 .  albuterol (PROVENTIL) (2.5 MG/3ML) 0.083% nebulizer solution, Take 3 mLs (2.5 mg total) by nebulization every 2 (two) hours as needed for wheezing., Disp: 75 mL, Rfl: 0 .  DULoxetine (CYMBALTA) 30 MG capsule, Take 30 mg by mouth daily with lunch. In conjunction with one 60 mg capsule to equal a total of 90 milligrams, Disp: , Rfl:  .  DULoxetine (CYMBALTA) 60 MG capsule, Take 60 mg by mouth daily with lunch. In conjunction with one 30 mg capsule to equal a total of 90 milligrams, Disp: , Rfl:  .  furosemide (LASIX) 40 MG tablet, Take 40 mg by mouth daily., Disp: , Rfl:  .  HUMALOG MIX 75/25 KWIKPEN (75-25) 100 UNIT/ML Kwikpen, Inject 25 Units into the skin 2 (two) times daily., Disp: 15 mL, Rfl: 10 .  KRISTALOSE 20 g packet, Take 20 g by mouth 3 (three) times daily., Disp: , Rfl: 3 .  levofloxacin (LEVAQUIN) 750 MG tablet, Take 1 tablet by mouth daily., Disp: , Rfl: 0 .  levothyroxine (SYNTHROID, LEVOTHROID) 50 MCG tablet, TAKE 1 TABLET(50 MCG) BY MOUTH DAILY BEFORE BREAKFAST, Disp: 30 tablet, Rfl: 11 .  methocarbamol (ROBAXIN) 750 MG tablet, Take 1 tablet by mouth as needed., Disp: , Rfl: 0 .  mometasone-formoterol (DULERA) 100-5 MCG/ACT AERO, Inhale 2 puffs  into the lungs 2 (two) times daily as needed for wheezing or shortness of breath., Disp: , Rfl:  .  omeprazole-sodium bicarbonate (ZEGERID) 40-1100 MG per capsule, Take 1 capsule by mouth daily before breakfast., Disp: , Rfl:  .  spironolactone (ALDACTONE) 25 MG tablet, Take 1 tablet (25 mg total) by mouth daily., Disp: 30 tablet, Rfl: 0 .  XIFAXAN 550 MG TABS tablet, Take 550 mg by mouth 2 (two) times daily., Disp: , Rfl: 5  Past Medical History: Past Medical History:  Diagnosis Date  . Angina   . Asthma   . CHF (congestive heart failure) ([place])   . 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 COPD (chronic obstructive pulmonary disease) ([place])   . Coronary artery disease    s/p CABG  . Depression   . Diabetes mellitus    type 2  . Dyspnea   . Edema extremities    consistent edema lower legs, feet, and right quadrant abdominal pain-"goes and comes"  . Family history of adverse reaction to anesthesia    SISTER HAS NAUSEA  . Fibromyalgia   . GERD (gastroesophageal reflux disease)   . H/O hiatal hernia   . Headache(784.0)    occ. generalized  . Heart murmur   . Hypercholesteremia   . Hypertension   . Hypothyroid   . Myocardial infarction [facility]) 11/2011   MI x2- 8'41,3'24 coronary stent(chest pain episode at time)  .  PONV (postoperative nausea and vomiting)     Tobacco Use: Social History   Tobacco Use  Smoking Status Never Smoker  Smokeless Tobacco Never Used    Labs: Recent Review Flowsheet Data    Labs for ITP Cardiac and Pulmonary Rehab Latest Ref Rng & Units 11/13/2017 11/13/2017 11/14/2017 11/15/2017 02/16/2018   Cholestrol 0 - 200 mg/dL - - - - -   LDLCALC 0 - 99 mg/dL - - - - -   HDL >40 mg/dL - - - - -   Trlycerides <150 mg/dL - - - - -   Hemoglobin A1c 4.8 - 5.6 % - - - - -   PHART 7.350 - 7.450 7.415 - - - 7.388   PCO2ART 32.0 - 48.0 mmHg 35.6 - - - 26.9(L)   HCO3 20.0 - 28.0 mmol/L 22.8 - - - 16.2(L)   TCO2 22 - 32 mmol/L 24 23 - - 17(L)   ACIDBASEDEF  0.0 - 2.0 mmol/L 1.0 - - - 8.0(H)   O2SAT % 97.0 - 75.7 67.5 96.0      Capillary Blood Glucose: Lab Results  Component Value Date   GLUCAP 161 (H) 07/09/2018   GLUCAP 156 (H) 07/09/2018   GLUCAP 269 (H) 07/09/2018   GLUCAP 147 (H) 06/10/2018   GLUCAP 165 (H) 02/20/2018     Exercise Target Goals:    Exercise Program Goal: Individual exercise prescription set using results from initial 6 min walk test and THRR while considering  patient's activity barriers and safety.   Exercise Prescription Goal: Starting with aerobic activity 30 plus minutes a day, 3 days per week for initial exercise prescription. Provide home exercise prescription and guidelines that participant acknowledges understanding prior to discharge.  Activity Barriers & Risk Stratification: Activity Barriers & Cardiac Risk Stratification - 05/05/18 1531      Activity Barriers & Cardiac Risk Stratification   Activity Barriers  Deconditioning;Balance Concerns;Muscular Weakness    Cardiac Risk Stratification  High       6 Minute Walk: 6 Minute Walk    Row Name 05/05/18 1531         6 Minute Walk   Phase  Initial     Distance  850 feet     Distance % Change  0 %     Distance Feet Change  0 ft     Walk Time  6 minutes     # of Rest Breaks  0     MPH  1.6     METS  2.23     RPE  15     Perceived Dyspnea   12     VO2 Peak  8.42     Symptoms  No     Resting HR  95 bpm     Resting BP  110/50     Resting Oxygen Saturation   100 %     Exercise Oxygen Saturation  during 6 min walk  81 %     Max Ex. HR  92 bpm     Max Ex. BP  140/60     2 Minute Post BP  120/56        Oxygen Initial Assessment:   Oxygen Re-Evaluation:   Oxygen Discharge (Final Oxygen Re-Evaluation):   Initial Exercise Prescription: Initial Exercise Prescription - 05/05/18 1500      Date of Initial Exercise RX and Referring Provider   Date  05/05/18    Referring Provider  Dr. Mickeal Skinner  Level  1    SPM  65     Minutes  15    METs  1.8      Arm Ergometer   Level  1    Watts  12    RPM  15    Minutes  20    METs  1.9      Prescription Details   Frequency (times per week)  3    Duration  Progress to 30 minutes of continuous aerobic without signs/symptoms of physical distress      Intensity   THRR 40-80% of Max Heartrate  [phone]    Ratings of Perceived Exertion  11-13    Perceived Dyspnea  0-4      Progression   Progression  Continue progressive overload as per policy without signs/symptoms or physical distress.      Resistance Training   Training Prescription  Yes    Weight  1    Reps  10-15       Perform Capillary Blood Glucose checks as needed.  Exercise Prescription Changes:  Exercise Prescription Changes    Row Name 05/08/18 1200 05/25/18 1400 06/08/18 1400 06/26/18 1700 07/08/18 0700     Response to Exercise   Blood Pressure (Admit)  130/62  110/54  120/68  118/84  120/58   Blood Pressure (Exercise)  120/70  140/60  120/50  124/68  130/54   Blood Pressure (Exit)  108/62  100/52  126/64  114/64  102/48   Heart Rate (Admit)  95 bpm  92 bpm  85 bpm  97 bpm  92 bpm   Heart Rate (Exercise)  101 bpm  105 bpm  90 bpm  112 bpm  107 bpm   Heart Rate (Exit)  100 bpm  101 bpm  89 bpm  103 bpm  98 bpm   Rating of Perceived Exertion (Exercise)  13  12  10  13  13    Duration  Progress to 30 minutes of  aerobic without signs/symptoms of physical distress  Progress to 30 minutes of  aerobic without signs/symptoms of physical distress  Progress to 30 minutes of  aerobic without signs/symptoms of physical distress  Progress to 30 minutes of  aerobic without signs/symptoms of physical distress  Progress to 30 minutes of  aerobic without signs/symptoms of physical distress   Intensity  THRR New 119-132-144  THRR New 118-130-143  THRR New 113-128-142  THRR unchanged  THRR unchanged     Progression   Progression  Continue to progress workloads to maintain intensity without signs/symptoms  of physical distress.  Continue to progress workloads to maintain intensity without signs/symptoms of physical distress.  Continue to progress workloads to maintain intensity without signs/symptoms of physical distress.  Continue to progress workloads to maintain intensity without signs/symptoms of physical distress.  Continue to progress workloads to maintain intensity without signs/symptoms of physical distress.   Average METs  -  -  -  -  2.05     Resistance Training   Training Prescription  Yes  Yes  Yes  Yes  Yes   Weight  1  1  2  2  2  May not be able to progress in weights d/t patients weakness   Reps  10-15  10-15  10-15  10-15  10-15   Time  -  -  -  -  5 Minutes     NuStep   Level  1  1  1  2   2  SPM  57  85  64  68  70   Minutes  15  15  15  15  15    METs  1.6  1.8  1.7  1.7  1.9     Arm Ergometer   Level  1  1  1.4  1.5  1.7   Watts  11  12  10  12  16    RPM  15  15  15  16   53   Minutes  20  20  20  20  20    METs  1.8  1.9  1.7  1.9  2.2     Home Exercise Plan   Plans to continue exercise at  -  -  -  Home (comment)  Home (comment)   Frequency  -  -  -  Add 2 additional days to program exercise sessions.  Add 2 additional days to program exercise sessions.   Initial Home Exercises Provided  -  -  -  05/18/18  05/18/18      Exercise Comments:  Exercise Comments    Row Name 05/08/18 1242 05/25/18 1433 06/08/18 1454 07/08/18 0739     Exercise Comments  Patient has just started CR and will be propressed accoridngly with her exercise goals.   Patient is doing well in CR. patient has maintained her levels so far and will be progressed further by next 2 week progression. Patient has been weak some days and having glucose problems also .   Patient has been doing well in CR. patient has increased her level on the arm ergometer to 1.4 and has also increased the size of her dumbbells to 2lbs. Patient has been impressing [place] all in CR.   Patient is gaining mobility slowly due to  her liver disease is progressing which make her unsteady on her feet at times and weak at times. Overall she is doing better and enjoys coming to the program. she enjoys the fellowship.        Exercise Goals and Review:  Exercise Goals    Row Name 05/05/18 1533             Exercise Goals   Increase Physical Activity  Yes       Intervention  Provide advice, education, support and counseling about physical activity/exercise needs.;Develop an individualized exercise prescription for aerobic and resistive training based on initial evaluation findings, risk stratification, comorbidities and participant's personal goals.       Expected Outcomes  Short Term: Attend rehab on a regular basis to increase amount of physical activity.       Increase Strength and Stamina  Yes       Intervention  Provide advice, education, support and counseling about physical activity/exercise needs.;Develop an individualized exercise prescription for aerobic and resistive training based on initial evaluation findings, risk stratification, comorbidities and participant's personal goals.       Expected Outcomes  Short Term: Increase workloads from initial exercise prescription for resistance, speed, and METs.       Able to understand and use rate of perceived exertion (RPE) scale  Yes       Intervention  Provide education and explanation on how to use RPE scale       Expected Outcomes  Long Term:  Able to use RPE to guide intensity level when exercising independently;Short Term: Able to use RPE daily in rehab to express subjective intensity level       Able to understand and  use Dyspnea scale  Yes       Intervention  Provide education and explanation on how to use Dyspnea scale       Expected Outcomes  Short Term: Able to use Dyspnea scale daily in rehab to express subjective sense of shortness of breath during exertion;Long Term: Able to use Dyspnea scale to guide intensity level when exercising independently        Knowledge and understanding of Target Heart Rate Range (THRR)  Yes       Intervention  Provide education and explanation of THRR including how the numbers were predicted and where they are located for reference       Expected Outcomes  Short Term: Able to state/look up THRR       Able to check pulse independently  Yes       Intervention  Provide education and demonstration on how to check pulse in carotid and radial arteries.;Review the importance of being able to check your own pulse for safety during independent exercise       Expected Outcomes  Short Term: Able to explain why pulse checking is important during independent exercise;Long Term: Able to check pulse independently and accurately       Understanding of Exercise Prescription  Yes       Intervention  Provide education, explanation, and written materials on patient's individual exercise prescription       Expected Outcomes  Long Term: Able to explain home exercise prescription to exercise independently;Short Term: Able to explain program exercise prescription          Exercise Goals Re-Evaluation : Exercise Goals Re-Evaluation    Row Name 05/18/18 1503 06/12/18 1430 07/08/18 0735         Exercise Goal Re-Evaluation   Exercise Goals Review  Increase Physical Activity;Increase Strength and Stamina;Able to understand and use rate of perceived exertion (RPE) scale;Able to understand and use Dyspnea scale;Understanding of Exercise Prescription;Able to check pulse independently;Knowledge and understanding of Target Heart Rate Range (THRR)  Increase Physical Activity;Increase Strength and Stamina;Able to understand and use rate of perceived exertion (RPE) scale;Able to understand and use Dyspnea scale;Understanding of Exercise Prescription;Able to check pulse independently;Knowledge and understanding of Target Heart Rate Range (THRR)  Increase Physical Activity     Comments  Patient has only been to CR for 5 sessions. Patient will be  progressed to meet her exercise related goals in time.   Patient has been doing very well in CR. Patient has stated to me that she has gained better mobilty from the program and she is going to talk to her doctor about being able to drive again.  Patient has stated that she is gaining more mobility but realizes that she will not be able to attain her gaol of driving on her own. She is a little more independent. Her liver disease is progressing and is limiting some of her progress.      Expected Outcomes  Patient wishes to gain mobility and to be able to drive again.   Patient wishes to gain mobility and to be able to drive again.   Patient still wants to gain mobility.          Discharge Exercise Prescription (Final Exercise Prescription Changes): Exercise Prescription Changes - 07/08/18 0700      Response to Exercise   Blood Pressure (Admit)  120/58    Blood Pressure (Exercise)  130/54    Blood Pressure (Exit)  102/48    Heart Rate (Admit)  92 bpm    Heart Rate (Exercise)  107 bpm    Heart Rate (Exit)  98 bpm    Rating of Perceived Exertion (Exercise)  13    Duration  Progress to 30 minutes of  aerobic without signs/symptoms of physical distress    Intensity  THRR unchanged      Progression   Progression  Continue to progress workloads to maintain intensity without signs/symptoms of physical distress.    Average METs  2.05      Resistance Training   Training Prescription  Yes    Weight  2 May not be able to progress in weights d/t patients weakness    Reps  10-15    Time  5 Minutes      NuStep   Level  2    SPM  70    Minutes  15    METs  1.9      Arm Ergometer   Level  1.7    Watts  16    RPM  53    Minutes  20    METs  2.2      Home Exercise Plan   Plans to continue exercise at  Home (comment)    Frequency  Add 2 additional days to program exercise sessions.    Initial Home Exercises Provided  05/18/18       Nutrition:  Target Goals: Understanding of nutrition  guidelines, daily intake of sodium 1500mg , cholesterol 200mg , calories 30% from fat and 7% or less from saturated fats, daily to have 5 or more servings of fruits and vegetables.  Biometrics: Pre Biometrics - 05/05/18 1534      Pre Biometrics   Height  5\' 2"  (1.575 m)    Weight  142 lb 3.2 oz (64.5 kg)    Waist Circumference  34.5 inches    Hip Circumference  37.5 inches    Waist to Hip Ratio  0.92 %    BMI (Calculated)  26    Triceps Skinfold  10 mm    % Body Fat  32.6 %    Grip Strength  34.2 kg    Flexibility  12.5 in    Single Leg Stand  0 seconds Balance Concerns        Nutrition Therapy Plan and Nutrition Goals: Nutrition Therapy & Goals - 07/15/18 0800      Nutrition Therapy   RD appointment deferred  Yes      Personal Nutrition Goals   Personal Goal #2  Patient continues to eat a heart healthy along with low sodium diet.     Additional Goals?  No       Nutrition Assessments: Nutrition Assessments - 05/05/18 1624      MEDFICTS Scores   Pre Score  30       Nutrition Goals Re-Evaluation:   Nutrition Goals Discharge (Final Nutrition Goals Re-Evaluation):   Psychosocial: Target Goals: Acknowledge presence or absence of significant depression and/or stress, maximize coping skills, provide positive support system. Participant is able to verbalize types and ability to use techniques and skills needed for reducing stress and depression.  Initial Review & Psychosocial Screening: Initial Psych Review & Screening - 05/05/18 1627      Initial Review   Current issues with  Current Depression      Family Dynamics   Good Support System?  Yes      Barriers   Psychosocial barriers to participate in program  Psychosocial barriers identified (see note)  Screening Interventions   Interventions  Encouraged to exercise;To provide support and resources with identified psychosocial needs;Provide feedback about the scores to participant    Expected Outcomes  Short  Term goal: Identification and review with participant of any Quality of Life or Depression concerns found by scoring the questionnaire.;Long Term goal: The participant improves quality of Life and PHQ9 Scores as seen by post scores and/or verbalization of changes       Quality of Life Scores: Quality of Life - 05/05/18 1535      Quality of Life Scores   Health/Function Pre  12.07 %    Socioeconomic Pre  27.38 %    Psych/Spiritual Pre  23.14 %    Family Pre  18 %    GLOBAL Pre  18.82 %      Scores of 19 and below usually indicate a poorer quality of life in these areas.  A difference of  2-3 points is a clinically meaningful difference.  A difference of 2-3 points in the total score of the Quality of Life Index has been associated with significant improvement in overall quality of life, self-image, physical symptoms, and general health in studies assessing change in quality of life.  PHQ-9: Recent Review Flowsheet Data    Depression screen [facility] 2/9 05/05/2018 10/29/2017 04/13/2012 01/27/2012   Decreased Interest 2 0 1 1   Down, Depressed, Hopeless 2 0 1 1   PHQ - 2 Score 4 0 2 2   Altered sleeping 2 - - -   Tired, decreased energy 2 - - -   Change in appetite 2 - - -   Feeling bad or failure about yourself  2 - - -   Trouble concentrating 1 - - -   Moving slowly or fidgety/restless 2 - - -   Suicidal thoughts 0 - - -   PHQ-9 Score 15 - - -   Difficult doing work/chores Very difficult - - -     Interpretation of Total Score  Total Score Depression Severity:  1-4 = Minimal depression, 5-9 = Mild depression, 10-14 = Moderate depression, 15-19 = Moderately severe depression, 20-27 = Severe depression   Psychosocial Evaluation and Intervention: Psychosocial Evaluation - 05/05/18 1628      Psychosocial Evaluation & Interventions   Interventions  Encouraged to exercise with the program and follow exercise prescription;Relaxation education;Stress management education    Continue  Psychosocial Services   Follow up required by staff       Psychosocial Re-Evaluation: Psychosocial Re-Evaluation    [place] Name 05/21/18 0911 06/15/18 1343 07/15/18 0804         Psychosocial Re-Evaluation   Current issues with  Current Depression;Current Stress Concerns  Current Depression;Current Stress Concerns  Current Depression;Current Stress Concerns     Comments  Patient's initial QOL score was 18.82 scoring lowest in health and function. Her PHQ-9 score was 15. She is currently being treated with Cymbalta 90 mg daily. She does not want to see a counselor but feels the Cymbalta is managing her depression.   Patient's initial QOL score was 18.82 scoring lowest in health and function. Her PHQ-9 score was 15. She is currently being treated with Cymbalta 90 mg daily. She does not want to see a counselor but feels the Cymbalta is managing her depression.   Patient's initial QOL score was 18.82 scoring lowest in health and function. Her PHQ-9 score was 15. She is currently being treated with Cymbalta 90 mg daily. She does not want to  see a counselor but feels the Cymbalta is managing her depression.      Expected Outcomes  Patient will have improved QOL and PHQ-9 scores and no additional psychosocial barriers identified at discharge.   Patient will have improved QOL and PHQ-9 scores and no additional psychosocial barriers identified at discharge.   Patient will have improved QOL and PHQ-9 scores and no additional psychosocial barriers identified at discharge.      Interventions  Stress management education;Encouraged to attend Cardiac Rehabilitation for the exercise;Relaxation education  Stress management education;Encouraged to attend Cardiac Rehabilitation for the exercise;Relaxation education  Stress management education;Encouraged to attend Cardiac Rehabilitation for the exercise;Relaxation education     Comments  Patient's current stress concerns are her health and her lack of independence.    Patient's current stress concerns are her health and her lack of independence.   Patient's current stress concerns are her health and her lack of independence.        Initial Review   Source of Stress Concerns  Chronic Illness  Chronic Illness  Chronic Illness        Psychosocial Discharge (Final Psychosocial Re-Evaluation): Psychosocial Re-Evaluation - 07/15/18 0804      Psychosocial Re-Evaluation   Current issues with  Current Depression;Current Stress Concerns    Comments  Patient's initial QOL score was 18.82 scoring lowest in health and function. Her PHQ-9 score was 15. She is currently being treated with Cymbalta 90 mg daily. She does not want to see a counselor but feels the Cymbalta is managing her depression.     Expected Outcomes  Patient will have improved QOL and PHQ-9 scores and no additional psychosocial barriers identified at discharge.     Interventions  Stress management education;Encouraged to attend Cardiac Rehabilitation for the exercise;Relaxation education    Comments  Patient's current stress concerns are her health and her lack of independence.       Initial Review   Source of Stress Concerns  Chronic Illness       Vocational Rehabilitation: Provide vocational rehab assistance to qualifying candidates.   Vocational Rehab Evaluation & Intervention: Vocational Rehab - 05/05/18 1612      Initial Vocational Rehab Evaluation & Intervention   Assessment shows need for Vocational Rehabilitation  No       Education: Education Goals: Education classes will be provided on a weekly basis, covering required topics. Participant will state understanding/return demonstration of topics presented.  Learning Barriers/Preferences: Learning Barriers/Preferences - 05/05/18 1610      Learning Barriers/Preferences   Learning Barriers  None    Learning Preferences  Written Material;Pictoral;Computer/Internet       Education Topics: Hypertension, Hypertension  Reduction -Define heart disease and high blood pressure. Discus how high blood pressure affects the body and ways to reduce high blood pressure.   Exercise and Your Heart -Discuss why it is important to exercise, the FITT principles of exercise, normal and abnormal responses to exercise, and how to exercise safely.   Angina -Discuss definition of angina, causes of angina, treatment of angina, and how to decrease risk of having angina.   Cardiac Medications -Review what the following cardiac medications are used for, how they affect the body, and side effects that may occur when taking the medications.  Medications include Aspirin, Beta blockers, calcium channel blockers, ACE Inhibitors, angiotensin receptor blockers, diuretics, digoxin, and antihyperlipidemics.   Congestive Heart Failure -Discuss the definition of CHF, how to live with CHF, the signs and symptoms of CHF, and how keep  track of weight and sodium intake.   CARDIAC REHAB PHASE II EXERCISE from 07/01/2018 in [place]  Date  05/13/18  Educator  DC  Instruction Review Code  2- Demonstrated Understanding      Heart Disease and Intimacy -Discus the effect sexual activity has on the heart, how changes occur during intimacy as we age, and safety during sexual activity.   Smoking Cessation / COPD -Discuss different methods to quit smoking, the health benefits of quitting smoking, and the definition of COPD.   Nutrition I: Fats -Discuss the types of cholesterol, what cholesterol does to the heart, and how cholesterol levels can be controlled.   CARDIAC REHAB PHASE II EXERCISE from 07/01/2018 in [place]  Date  06/10/18  Educator  DJ  Instruction Review Code  2- Demonstrated Understanding      Nutrition II: Labels -Discuss the different components of food labels and how to read food label   CARDIAC REHAB PHASE II EXERCISE from 07/01/2018 in [place]   Date  06/03/18  Educator  DC  Instruction Review Code  2- Demonstrated Understanding      Heart Parts/Heart Disease and PAD -Discuss the anatomy of the heart, the pathway of blood circulation through the heart, and these are affected by heart disease.   CARDIAC REHAB PHASE II EXERCISE from 07/01/2018 in [place]  Date  06/17/18  Educator  DC  Instruction Review Code  2- Demonstrated Understanding      Stress I: Signs and Symptoms -Discuss the causes of stress, how stress may lead to anxiety and depression, and ways to limit stress.   CARDIAC REHAB PHASE II EXERCISE from 07/01/2018 in [place]  Date  06/24/18  Educator  D. Coad  Instruction Review Code  2- Demonstrated Understanding      Stress II: Relaxation -Discuss different types of relaxation techniques to limit stress.   CARDIAC REHAB PHASE II EXERCISE from 07/01/2018 in [place]  Date  07/01/18  Educator  DC  Instruction Review Code  2- Demonstrated Understanding      Warning Signs of Stroke / TIA -Discuss definition of a stroke, what the signs and symptoms are of a stroke, and how to identify when someone is having stroke.   Knowledge Questionnaire Score: Knowledge Questionnaire Score - 05/05/18 1610      Knowledge Questionnaire Score   Pre Score  22/24       Core Components/Risk Factors/Patient Goals at Admission: Personal Goals and Risk Factors at Admission - 05/05/18 1624      Core Components/Risk Factors/Patient Goals on Admission    Weight Management  Weight Maintenance    Diabetes  Yes    Intervention  Provide education about signs/symptoms and action to take for hypo/hyperglycemia.    Expected Outcomes  Short Term: Participant verbalizes understanding of the signs/symptoms and immediate care of hyper/hypoglycemia, proper foot care and importance of medication, aerobic/resistive exercise and nutrition plan for blood glucose  control.;Long Term: Attainment of HbA1C < 7%.    Stress  Yes    Intervention  Offer individual and/or small group education and counseling on adjustment to heart disease, stress management and health-related lifestyle change. Teach and support self-help strategies.    Expected Outcomes  Short Term: Participant demonstrates changes in health-related behavior, relaxation and other stress management skills, ability to obtain effective social support, and compliance with psychotropic medications if prescribed.;Long Term: Emotional wellbeing is indicated by absence  of clinically significant psychosocial distress or social isolation.    Personal Goal Other  Yes    Personal Goal  Gain more mobility, Drive again, be more independent    Intervention  Attend CR 3 x week and supplement at home exercise 2 x week.     Expected Outcomes  Achieve personal goals.        Core Components/Risk Factors/Patient Goals Review:  Goals and Risk Factor Review    Row Name 05/21/18 0902 06/15/18 1337 07/15/18 0800         Core Components/Risk Factors/Patient Goals Review   Personal Goals Review  Diabetes;Stress;Weight Management/Obesity Increase mobility; drive again; more independent.   Diabetes;Stress;Weight Management/Obesity Get more mobility; drive again; be more independent. Get liver transplant.   Diabetes;Stress;Weight Management/Obesity Gain more mobility; drive again; be more independent; Get liver transplant.     Review  Patient has completed 7 sessions gaining 7.5 lbs since she started the program. She has cirrohosis of the liver which she attribtues to her weight gain. She hopes to get a transplant. She is currently on the transplant list. She is doing well in the program with some progression. She says she does feel a little stronger since she started and hopes to continue to improve as she continues the program. She says her husband sees a difference in her appearance and her activity since she has been  attending. Her last A1C was 11/10/17 was 7.5. Her fasting readings reported during sessions average 200. Will continue to monitor for progress.   Patient has completed 17 sessions losing 7 lbs since he started the program. She continues to do well in the program with progression. She says [facility] transplant team continue to be in contact with her and she is hoping to go in the list. She has not recent A1C's on file. Her reported glucose readings have been labile. She says she feels more mobile and feels her strength has increased. She says her husband has noticed she is more independent and is doing more around the house. Will continue to monitor for progress.   Patient has completed 25 sessions gaining 20 lbs since her initial visit due to her liver disease. She was admitted to the hospital 07/06/18 and transported to [facility] with pneumonia. She is still admitted. She continues to attend the program with some progression. She has no recent A1C's on file. Her reported glucose readings remain labile. She says she feels stronger some days but she has multiple chronic issues that impeded her progress in the program. Will conitnue to monitor for progress.      Expected Outcomes  Patient will continue to attend sessions and complete the program meeting her personal needs.   Patient will continue to attend sessions and complete the program meeting her personal needs.   Patient will continue to attend sessions and complete the program meeting her personal needs.         Core Components/Risk Factors/Patient Goals at Discharge (Final Review):  Goals and Risk Factor Review - 07/15/18 0800      Core Components/Risk Factors/Patient Goals Review   Personal Goals Review  Diabetes;Stress;Weight Management/Obesity Gain more mobility; drive again; be more independent; Get liver transplant.    Review  Patient has completed 25 sessions gaining 20 lbs since her initial visit due to her liver disease. She was admitted to the hospital  07/06/18 and transported to [facility] with pneumonia. She is still admitted. She continues to attend the program with some progression. She has no  recent A1C's on file. Her reported glucose readings remain labile. She says she feels stronger some days but she has multiple chronic issues that impeded her progress in the program. Will conitnue to monitor for progress.     Expected Outcomes  Patient will continue to attend sessions and complete the program meeting her personal needs.        ITP Comments: ITP Comments    Row Name 05/05/18 1613           ITP Comments  Mrs. Derryberry is a pleasant 67 year old patient. She has been through the program in 2012 due to previous heart events. She has cirrhosis of liver non alcohol induced. She has poor balance. She is very egaer to get started to regain her strength.           Comments: ITP 30 Day REVIEW Patient doing well in the program. Will continue to monitor for progress.

## 2018-07-17 ENCOUNTER — Ambulatory Visit (HOSPITAL_COMMUNITY): Payer: Medicare Other

## 2018-07-17 ENCOUNTER — Encounter (HOSPITAL_COMMUNITY): Payer: Medicare Other

## 2018-07-20 ENCOUNTER — Encounter (HOSPITAL_COMMUNITY)
Admission: RE | Admit: 2018-07-20 | Discharge: 2018-07-20 | Disposition: A | Discharge status: Home / Self Care | Payer: Medicare Other | Source: Ambulatory Visit | Attending: Cardiology | Admitting: Cardiology

## 2018-07-20 ENCOUNTER — Ambulatory Visit (HOSPITAL_COMMUNITY): Payer: Medicare Other

## 2018-07-20 DIAGNOSIS — I252 Old myocardial infarction: Secondary | ICD-10-CM | POA: Insufficient documentation

## 2018-07-20 DIAGNOSIS — I2510 Atherosclerotic heart disease of native coronary artery without angina pectoris: Secondary | ICD-10-CM | POA: Diagnosis not present

## 2018-07-20 DIAGNOSIS — E039 Hypothyroidism, unspecified: Secondary | ICD-10-CM | POA: Insufficient documentation

## 2018-07-20 DIAGNOSIS — K219 Gastro-esophageal reflux disease without esophagitis: Secondary | ICD-10-CM | POA: Diagnosis not present

## 2018-07-20 DIAGNOSIS — E119 Type 2 diabetes mellitus without complications: Secondary | ICD-10-CM | POA: Insufficient documentation

## 2018-07-20 DIAGNOSIS — Z951 Presence of aortocoronary bypass graft: Secondary | ICD-10-CM | POA: Diagnosis not present

## 2018-07-20 DIAGNOSIS — Z79899 Other long term (current) drug therapy: Secondary | ICD-10-CM | POA: Diagnosis not present

## 2018-07-20 DIAGNOSIS — Z79890 Hormone replacement therapy (postmenopausal): Secondary | ICD-10-CM | POA: Insufficient documentation

## 2018-07-20 DIAGNOSIS — I110 Hypertensive heart disease with heart failure: Secondary | ICD-10-CM | POA: Diagnosis not present

## 2018-07-20 DIAGNOSIS — M797 Fibromyalgia: Secondary | ICD-10-CM | POA: Insufficient documentation

## 2018-07-20 DIAGNOSIS — Z952 Presence of prosthetic heart valve: Secondary | ICD-10-CM

## 2018-07-20 DIAGNOSIS — E785 Hyperlipidemia, unspecified: Secondary | ICD-10-CM | POA: Diagnosis not present

## 2018-07-20 DIAGNOSIS — J449 Chronic obstructive pulmonary disease, unspecified: Secondary | ICD-10-CM | POA: Insufficient documentation

## 2018-07-20 DIAGNOSIS — Z953 Presence of xenogenic heart valve: Secondary | ICD-10-CM | POA: Diagnosis not present

## 2018-07-20 DIAGNOSIS — F329 Major depressive disorder, single episode, unspecified: Secondary | ICD-10-CM | POA: Insufficient documentation

## 2018-07-20 DIAGNOSIS — I509 Heart failure, unspecified: Secondary | ICD-10-CM | POA: Insufficient documentation

## 2018-07-20 NOTE — Progress Notes (Signed)
Daily Session Note  Patient Details  Name: Denise Cuevas MRN: [id number] Date of Birth: [birth year] Referring Provider:     CARDIAC REHAB PHASE II ORIENTATION from 05/05/2018 in [place]  Referring Provider  Dr. Servando Snare      Encounter Date: 07/20/2018  Check In: Session Check In - 07/20/18 0930      Check-In   Supervising physician immediately available to respond to emergencies  See telemetry face sheet for immediately available MD    Location  [facility]    Staff Present  Russella Dar, MS, EP, [facility], Exercise Physiologist;Debra Wynetta Emery, RN, BSN    Medication changes reported      No    Fall or balance concerns reported     Yes    Warm-up and Cool-down  Performed as group-led instruction    Resistance Training Performed  No    VAD Patient?  No    PAD/SET Patient?  No      Pain Assessment   Currently in Pain?  No/denies    Pain Score  0-No pain    Multiple Pain Sites  No       Capillary Blood Glucose: No results found for this or any previous visit (from the past 24 hour(s)).    Social History   Tobacco Use  Smoking Status Never Smoker  Smokeless Tobacco Never Used    Goals Met:  Independence with exercise equipment Exercise tolerated well Personal goals reviewed No report of cardiac concerns or symptoms Strength training completed today  Goals Unmet:  Not Applicable  Comments: Pt able to follow exercise prescription today without complaint.  Will continue to monitor for progression. Check out: 1030   Dr. Kate Sable is Medical Director for [facility] Cardiac and Pulmonary Rehab.

## 2018-07-22 ENCOUNTER — Encounter (HOSPITAL_COMMUNITY)
Admission: RE | Admit: 2018-07-22 | Discharge: 2018-07-22 | Disposition: A | Discharge status: Home / Self Care | Payer: Medicare Other | Source: Ambulatory Visit | Attending: Cardiology | Admitting: Cardiology

## 2018-07-22 ENCOUNTER — Ambulatory Visit (HOSPITAL_COMMUNITY): Payer: Medicare Other

## 2018-07-22 DIAGNOSIS — Z952 Presence of prosthetic heart valve: Secondary | ICD-10-CM

## 2018-07-22 DIAGNOSIS — Z953 Presence of xenogenic heart valve: Secondary | ICD-10-CM | POA: Diagnosis not present

## 2018-07-22 NOTE — Progress Notes (Signed)
Daily Session Note  Patient Details  Name: Denise Cuevas MRN: [id number] Date of Birth: 29-Dec-1950 Referring Provider:     [place] from 05/05/2018 in [place]  Referring Provider  Dr. Servando Snare      Encounter Date: 07/22/2018  Check In: Session Check In - 07/22/18 0915      Check-In   Supervising physician immediately available to respond to emergencies  See telemetry face sheet for immediately available MD    Location  [facility]    Staff Present  Russella Dar, MS, EP, [facility], Exercise Physiologist;Debra Wynetta Emery, RN, BSN    Medication changes reported      No    Fall or balance concerns reported     Yes    Comments  Has fallen twice within 12 months.     Warm-up and Cool-down  Performed as group-led instruction    Resistance Training Performed  No    VAD Patient?  No    PAD/SET Patient?  No      Pain Assessment   Currently in Pain?  No/denies    Multiple Pain Sites  No       Capillary Blood Glucose: No results found for this or any previous visit (from the past 24 hour(s)).    Social History   Tobacco Use  Smoking Status Never Smoker  Smokeless Tobacco Never Used    Goals Met:  Independence with exercise equipment Exercise tolerated well Personal goals reviewed No report of cardiac concerns or symptoms Strength training completed today  Goals Unmet:  Not Applicable  Comments: Pt able to follow exercise prescription today without complaint.  Will continue to monitor for progression. Check out: 1030   Dr. Kate Sable is Medical Director for [facility] Cardiac and Pulmonary Rehab.

## 2018-07-24 ENCOUNTER — Ambulatory Visit (HOSPITAL_COMMUNITY): Payer: Medicare Other

## 2018-07-24 ENCOUNTER — Encounter (HOSPITAL_COMMUNITY)
Admission: RE | Admit: 2018-07-24 | Discharge: 2018-07-24 | Disposition: A | Discharge status: Home / Self Care | Payer: Medicare Other | Source: Ambulatory Visit | Attending: Cardiology | Admitting: Cardiology

## 2018-07-24 DIAGNOSIS — Z952 Presence of prosthetic heart valve: Secondary | ICD-10-CM

## 2018-07-24 DIAGNOSIS — Z953 Presence of xenogenic heart valve: Secondary | ICD-10-CM | POA: Diagnosis not present

## 2018-07-24 NOTE — Progress Notes (Signed)
Daily Session Note  Patient Details  Name: Denise Cuevas MRN: [id number] Date of Birth: [birth year] Referring Provider:     CARDIAC REHAB PHASE II ORIENTATION from 05/05/2018 in [place]  Referring Provider  Dr. Servando Snare      Encounter Date: 07/24/2018  Check In: Session Check In - 07/24/18 0930      Check-In   Supervising physician immediately available to respond to emergencies  See telemetry face sheet for immediately available MD    Location  [facility]    Staff Present  Russella Dar, MS, EP, [facility], Exercise Physiologist;Guinn Delarosa Wynetta Emery, RN, BSN    Medication changes reported      No    Fall or balance concerns reported     Yes    Comments  Has fallen twice within 12 months.     Warm-up and Cool-down  Performed as group-led instruction    Resistance Training Performed  No    VAD Patient?  No    PAD/SET Patient?  No      Pain Assessment   Currently in Pain?  No/denies    Pain Score  0-No pain    Multiple Pain Sites  No       Capillary Blood Glucose: No results found for this or any previous visit (from the past 24 hour(s)).    Social History   Tobacco Use  Smoking Status Never Smoker  Smokeless Tobacco Never Used    Goals Met:  Independence with exercise equipment Exercise tolerated well No report of cardiac concerns or symptoms Strength training completed today  Goals Unmet:  Not Applicable  Comments: Check out 1030.   Dr. Kate Sable is Medical Director for [facility] Cardiac and Pulmonary Rehab.

## 2018-07-27 ENCOUNTER — Encounter (HOSPITAL_COMMUNITY)
Admission: RE | Admit: 2018-07-27 | Discharge: 2018-07-27 | Disposition: A | Discharge status: Home / Self Care | Payer: Medicare Other | Source: Ambulatory Visit | Attending: Cardiology | Admitting: Cardiology

## 2018-07-27 ENCOUNTER — Ambulatory Visit (HOSPITAL_COMMUNITY): Payer: Medicare Other

## 2018-07-27 DIAGNOSIS — Z952 Presence of prosthetic heart valve: Secondary | ICD-10-CM

## 2018-07-27 DIAGNOSIS — Z953 Presence of xenogenic heart valve: Secondary | ICD-10-CM | POA: Diagnosis not present

## 2018-07-27 NOTE — Progress Notes (Signed)
Daily Session Note  Patient Details  Name: Denise Cuevas MRN: [id number] Date of Birth: [birth year] Referring Provider:     CARDIAC REHAB PHASE II ORIENTATION from 05/05/2018 in [place]  Referring Provider  Dr. Servando Snare      Encounter Date: 07/27/2018  Check In: Session Check In - 07/27/18 0930      Check-In   Supervising physician immediately available to respond to emergencies  See telemetry face sheet for immediately available MD    Location  [facility]    Staff Present  Russella Dar, MS, EP, [facility], Exercise Physiologist;Debra Wynetta Emery, RN, BSN    Medication changes reported      No    Fall or balance concerns reported     Yes    Comments  Has fallen twice within 12 months.     Warm-up and Cool-down  Performed as group-led instruction    Resistance Training Performed  No    VAD Patient?  No      Pain Assessment   Currently in Pain?  No/denies    Pain Score  0-No pain    Multiple Pain Sites  No       Capillary Blood Glucose: No results found for this or any previous visit (from the past 24 hour(s)).    Social History   Tobacco Use  Smoking Status Never Smoker  Smokeless Tobacco Never Used    Goals Met:  Independence with exercise equipment Exercise tolerated well Personal goals reviewed No report of cardiac concerns or symptoms Strength training completed today  Goals Unmet:  Not Applicable  Comments: Pt able to follow exercise prescription today without complaint.  Will continue to monitor for progression. Check out: 1030   Dr. Kate Sable is Medical Director for [facility] Cardiac and Pulmonary Rehab.

## 2018-07-29 ENCOUNTER — Encounter (HOSPITAL_COMMUNITY)
Admission: RE | Admit: 2018-07-29 | Discharge: 2018-07-29 | Disposition: A | Discharge status: Home / Self Care | Payer: Medicare Other | Source: Ambulatory Visit | Attending: Cardiology | Admitting: Cardiology

## 2018-07-29 ENCOUNTER — Ambulatory Visit (HOSPITAL_COMMUNITY): Payer: Medicare Other

## 2018-07-29 DIAGNOSIS — Z952 Presence of prosthetic heart valve: Secondary | ICD-10-CM

## 2018-07-29 DIAGNOSIS — Z953 Presence of xenogenic heart valve: Secondary | ICD-10-CM | POA: Diagnosis not present

## 2018-07-29 NOTE — Progress Notes (Signed)
Daily Session Note  Patient Details  Name: Denise Cuevas MRN: [id number] Date of Birth: November 08, 1951 Referring Provider:     CARDIAC REHAB PHASE II ORIENTATION from 05/05/2018 in [place]  Referring Provider  Dr. Servando Snare      Encounter Date: 07/29/2018  Check In: Session Check In - 07/29/18 0930      Check-In   Supervising physician immediately available to respond to emergencies  See telemetry face sheet for immediately available MD    Location  [facility]    Staff Present  Russella Dar, MS, EP, [facility], Exercise Physiologist;Nils Thor Wynetta Emery, RN, BSN    Medication changes reported      No    Fall or balance concerns reported     Yes    Comments  Has fallen twice within 12 months.     Warm-up and Cool-down  Performed as group-led instruction    Resistance Training Performed  No    VAD Patient?  No    PAD/SET Patient?  No      Pain Assessment   Currently in Pain?  No/denies    Pain Score  0-No pain    Multiple Pain Sites  No       Capillary Blood Glucose: No results found for this or any previous visit (from the past 24 hour(s)).    Social History   Tobacco Use  Smoking Status Never Smoker  Smokeless Tobacco Never Used    Goals Met:  Independence with exercise equipment Exercise tolerated well No report of cardiac concerns or symptoms Strength training completed today  Goals Unmet:  Not Applicable  Comments: Pt able to follow exercise prescription today without complaint.  Will continue to monitor for progression. Check out 1030.   Dr. Kate Sable is Medical Director for [facility] Cardiac and Pulmonary Rehab.

## 2018-07-31 ENCOUNTER — Ambulatory Visit (HOSPITAL_COMMUNITY): Payer: Medicare Other

## 2018-07-31 ENCOUNTER — Encounter (HOSPITAL_COMMUNITY)
Admission: RE | Admit: 2018-07-31 | Discharge: 2018-07-31 | Disposition: A | Discharge status: Home / Self Care | Payer: Medicare Other | Source: Ambulatory Visit | Attending: Cardiology | Admitting: Cardiology

## 2018-07-31 DIAGNOSIS — Z953 Presence of xenogenic heart valve: Secondary | ICD-10-CM | POA: Diagnosis not present

## 2018-08-03 ENCOUNTER — Encounter (HOSPITAL_COMMUNITY)
Admission: RE | Admit: 2018-08-03 | Discharge: 2018-08-03 | Disposition: A | Discharge status: Home / Self Care | Payer: Medicare Other | Source: Ambulatory Visit | Attending: Cardiology | Admitting: Cardiology

## 2018-08-03 ENCOUNTER — Ambulatory Visit (HOSPITAL_COMMUNITY): Payer: Medicare Other

## 2018-08-03 DIAGNOSIS — Z953 Presence of xenogenic heart valve: Secondary | ICD-10-CM | POA: Diagnosis not present

## 2018-08-03 DIAGNOSIS — Z952 Presence of prosthetic heart valve: Secondary | ICD-10-CM

## 2018-08-03 NOTE — Progress Notes (Addendum)
Daily Session Note  Patient Details  Name: Denise Cuevas MRN: [id number] Date of Birth: 03-31-1951 Referring Provider:     CARDIAC REHAB PHASE II ORIENTATION from 05/05/2018 in [place]  Referring Provider  Dr. Servando Snare      Encounter Date: 08/03/2018  Check In: Session Check In - 08/03/18 0930      Check-In   Supervising physician immediately available to respond to emergencies  See telemetry face sheet for immediately available MD    Location  [facility]    Staff Present  Russella Dar, MS, EP, [facility], Exercise Physiologist;Benjamen Koelling Wynetta Emery, RN, BSN    Medication changes reported      No    Fall or balance concerns reported     Yes    Comments  Has fallen twice within 12 months.     Warm-up and Cool-down  Performed as group-led instruction    Resistance Training Performed  No    VAD Patient?  No    PAD/SET Patient?  No      Pain Assessment   Currently in Pain?  No/denies    Pain Score  0-No pain    Multiple Pain Sites  No       Capillary Blood Glucose: No results found for this or any previous visit (from the past 24 hour(s)).    Social History   Tobacco Use  Smoking Status Never Smoker  Smokeless Tobacco Never Used    Goals Met:  Independence with exercise equipment Exercise tolerated well No report of cardiac concerns or symptoms Strength training completed today  Goals Unmet:  Not Applicable  Comments: Pt able to follow exercise prescription today without complaint.  Will continue to monitor for progression.  Check out 1030.   Dr. Kate Sable is Medical Director for [facility] Cardiac and Pulmonary Rehab.

## 2018-08-04 NOTE — Progress Notes (Signed)
Cardiac Individual Treatment Plan  Patient Details  Name: Denise Cuevas MRN: [id number] Date of Birth: October 18, 1951 Referring Provider:     [facility] from 05/05/2018 in [place]  Referring Provider  Dr. Servando Snare      Initial Encounter Date:    CARDIAC REHAB PHASE II ORIENTATION from 05/05/2018 in [place]  Date  05/05/18      Visit Diagnosis: S/P AVR  S/P MVR (mitral valve replacement)  Patient's Home Medications on Admission:  Current Outpatient Medications:  .  albuterol (PROVENTIL HFA;VENTOLIN HFA) 108 (90 BASE) MCG/ACT inhaler, Inhale 2 puffs into the lungs every 6 (six) hours as needed for wheezing or shortness of breath., Disp: 1 Inhaler, Rfl: 2 .  albuterol (PROVENTIL) (2.5 MG/3ML) 0.083% nebulizer solution, Take 3 mLs (2.5 mg total) by nebulization every 2 (two) hours as needed for wheezing., Disp: 75 mL, Rfl: 0 .  DULoxetine (CYMBALTA) 30 MG capsule, Take 30 mg by mouth daily with lunch. In conjunction with one 60 mg capsule to equal a total of 90 milligrams, Disp: , Rfl:  .  DULoxetine (CYMBALTA) 60 MG capsule, Take 60 mg by mouth daily with lunch. In conjunction with one 30 mg capsule to equal a total of 90 milligrams, Disp: , Rfl:  .  furosemide (LASIX) 40 MG tablet, Take 40 mg by mouth daily., Disp: , Rfl:  .  HUMALOG MIX 75/25 KWIKPEN (75-25) 100 UNIT/ML Kwikpen, Inject 25 Units into the skin 2 (two) times daily., Disp: 15 mL, Rfl: 10 .  KRISTALOSE 20 g packet, Take 20 g by mouth 3 (three) times daily., Disp: , Rfl: 3 .  levofloxacin (LEVAQUIN) 750 MG tablet, Take 1 tablet by mouth daily., Disp: , Rfl: 0 .  levothyroxine (SYNTHROID, LEVOTHROID) 50 MCG tablet, TAKE 1 TABLET(50 MCG) BY MOUTH DAILY BEFORE BREAKFAST, Disp: 30 tablet, Rfl: 11 .  methocarbamol (ROBAXIN) 750 MG tablet, Take 1 tablet by mouth as needed., Disp: , Rfl: 0 .  mometasone-formoterol (DULERA) 100-5 MCG/ACT AERO, Inhale 2 puffs  into the lungs 2 (two) times daily as needed for wheezing or shortness of breath., Disp: , Rfl:  .  omeprazole-sodium bicarbonate (ZEGERID) 40-1100 MG per capsule, Take 1 capsule by mouth daily before breakfast., Disp: , Rfl:  .  spironolactone (ALDACTONE) 25 MG tablet, Take 1 tablet (25 mg total) by mouth daily., Disp: 30 tablet, Rfl: 0 .  XIFAXAN 550 MG TABS tablet, Take 550 mg by mouth 2 (two) times daily., Disp: , Rfl: 5  Past Medical History: Past Medical History:  Diagnosis Date  . Angina   . Asthma   . CHF (congestive heart failure) ([place])   . 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 COPD (chronic obstructive pulmonary disease) ([place])   . Coronary artery disease    s/p CABG  . Depression   . Diabetes mellitus    type 2  . Dyspnea   . Edema extremities    consistent edema lower legs, feet, and right quadrant abdominal pain-"goes and comes"  . Family history of adverse reaction to anesthesia    SISTER HAS NAUSEA  . Fibromyalgia   . GERD (gastroesophageal reflux disease)   . H/O hiatal hernia   . Headache(784.0)    occ. generalized  . Heart murmur   . Hypercholesteremia   . Hypertension   . Hypothyroid   . Myocardial infarction [facility]) 11/2011   MI x2- 0'10,2'72 coronary stent(chest pain episode at time)  .  PONV (postoperative nausea and vomiting)     Tobacco Use: Social History   Tobacco Use  Smoking Status Never Smoker  Smokeless Tobacco Never Used    Labs: Recent Review Flowsheet Data    Labs for ITP Cardiac and Pulmonary Rehab Latest Ref Rng & Units 11/13/2017 11/13/2017 11/14/2017 11/15/2017 02/16/2018   Cholestrol 0 - 200 mg/dL - - - - -   LDLCALC 0 - 99 mg/dL - - - - -   HDL >40 mg/dL - - - - -   Trlycerides <150 mg/dL - - - - -   Hemoglobin A1c 4.8 - 5.6 % - - - - -   PHART 7.350 - 7.450 7.415 - - - 7.388   PCO2ART 32.0 - 48.0 mmHg 35.6 - - - 26.9(L)   HCO3 20.0 - 28.0 mmol/L 22.8 - - - 16.2(L)   TCO2 22 - 32 mmol/L 24 23 - - 17(L)   ACIDBASEDEF  0.0 - 2.0 mmol/L 1.0 - - - 8.0(H)   O2SAT % 97.0 - 75.7 67.5 96.0      Capillary Blood Glucose: Lab Results  Component Value Date   GLUCAP 161 (H) 07/09/2018   GLUCAP 156 (H) 07/09/2018   GLUCAP 269 (H) 07/09/2018   GLUCAP 147 (H) 06/10/2018   GLUCAP 165 (H) 02/20/2018     Exercise Target Goals: Exercise Program Goal: Individual exercise prescription set using results from initial 6 min walk test and THRR while considering  patient's activity barriers and safety.   Exercise Prescription Goal: Starting with aerobic activity 30 plus minutes a day, 3 days per week for initial exercise prescription. Provide home exercise prescription and guidelines that participant acknowledges understanding prior to discharge.  Activity Barriers & Risk Stratification: Activity Barriers & Cardiac Risk Stratification - 05/05/18 1531      Activity Barriers & Cardiac Risk Stratification   Activity Barriers  Deconditioning;Balance Concerns;Muscular Weakness    Cardiac Risk Stratification  High       6 Minute Walk: 6 Minute Walk    Row Name 05/05/18 1531         6 Minute Walk   Phase  Initial     Distance  850 feet     Distance % Change  0 %     Distance Feet Change  0 ft     Walk Time  6 minutes     # of Rest Breaks  0     MPH  1.6     METS  2.23     RPE  15     Perceived Dyspnea   12     VO2 Peak  8.42     Symptoms  No     Resting HR  95 bpm     Resting BP  110/50     Resting Oxygen Saturation   100 %     Exercise Oxygen Saturation  during 6 min walk  81 %     Max Ex. HR  92 bpm     Max Ex. BP  140/60     2 Minute Post BP  120/56        Oxygen Initial Assessment:   Oxygen Re-Evaluation:   Oxygen Discharge (Final Oxygen Re-Evaluation):   Initial Exercise Prescription: Initial Exercise Prescription - 05/05/18 1500      Date of Initial Exercise RX and Referring Provider   Date  05/05/18    Referring Provider  Dr. Servando Snare      NuStep   Level  1    SPM  65     Minutes  15    METs  1.8      Arm Ergometer   Level  1    Watts  12    RPM  15    Minutes  20    METs  1.9      Prescription Details   Frequency (times per week)  3    Duration  Progress to 30 minutes of continuous aerobic without signs/symptoms of physical distress      Intensity   THRR 40-80% of Max Heartrate  [id number]    Ratings of Perceived Exertion  11-13    Perceived Dyspnea  0-4      Progression   Progression  Continue progressive overload as per policy without signs/symptoms or physical distress.      Resistance Training   Training Prescription  Yes    Weight  1    Reps  10-15       Perform Capillary Blood Glucose checks as needed.  Exercise Prescription Changes:  Exercise Prescription Changes    Row Name 05/08/18 1200 05/25/18 1400 06/08/18 1400 06/26/18 1700 07/08/18 0700     Response to Exercise   Blood Pressure (Admit)  130/62  110/54  120/68  118/84  120/58   Blood Pressure (Exercise)  120/70  140/60  120/50  124/68  130/54   Blood Pressure (Exit)  108/62  100/52  126/64  114/64  102/48   Heart Rate (Admit)  95 bpm  92 bpm  85 bpm  97 bpm  92 bpm   Heart Rate (Exercise)  101 bpm  105 bpm  90 bpm  112 bpm  107 bpm   Heart Rate (Exit)  100 bpm  101 bpm  89 bpm  103 bpm  98 bpm   Rating of Perceived Exertion (Exercise)  13  12  10  13  13    Duration  Progress to 30 minutes of  aerobic without signs/symptoms of physical distress  Progress to 30 minutes of  aerobic without signs/symptoms of physical distress  Progress to 30 minutes of  aerobic without signs/symptoms of physical distress  Progress to 30 minutes of  aerobic without signs/symptoms of physical distress  Progress to 30 minutes of  aerobic without signs/symptoms of physical distress   Intensity  THRR New 119-132-144  THRR New 118-130-143  THRR New 113-128-142  THRR unchanged  THRR unchanged     Progression   Progression  Continue to progress workloads to maintain intensity without signs/symptoms  of physical distress.  Continue to progress workloads to maintain intensity without signs/symptoms of physical distress.  Continue to progress workloads to maintain intensity without signs/symptoms of physical distress.  Continue to progress workloads to maintain intensity without signs/symptoms of physical distress.  Continue to progress workloads to maintain intensity without signs/symptoms of physical distress.   Average METs  -  -  -  -  2.05     Resistance Training   Training Prescription  Yes  Yes  Yes  Yes  Yes   Weight  1  1  2  2  2  May not be able to progress in weights d/t patients weakness   Reps  10-15  10-15  10-15  10-15  10-15   Time  -  -  -  -  5 Minutes     NuStep   Level  1  1  1  2  2    SPM  57  85  64  68  70   Minutes  15  15  15  15  15    METs  1.6  1.8  1.7  1.7  1.9     Arm Ergometer   Level  1  1  1.4  1.5  1.7   Watts  11  12  10  12  16    RPM  15  15  15  16   53   Minutes  20  20  20  20  20    METs  1.8  1.9  1.7  1.9  2.2     Home Exercise Plan   Plans to continue exercise at  -  -  -  Home (comment)  Home (comment)   Frequency  -  -  -  Add 2 additional days to program exercise sessions.  Add 2 additional days to program exercise sessions.   Initial Home Exercises Provided  -  -  -  05/18/18  05/18/18   Row Name 07/31/18 1500 07/31/18 1510           Response to Exercise   Blood Pressure (Admit)  124/68  -      Blood Pressure (Exercise)  130/60  -      Blood Pressure (Exit)  120/60  -      Heart Rate (Admit)  89 bpm  -      Heart Rate (Exercise)  103 bpm  -      Heart Rate (Exit)  96 bpm  -      Rating of Perceived Exertion (Exercise)  12  -      Duration  Progress to 30 minutes of  aerobic without signs/symptoms of physical distress  -      Intensity  THRR unchanged  -        Progression   Progression  Continue to progress workloads to maintain intensity without signs/symptoms of physical distress.  -      Average METs  1.8  -         Resistance Training   Training Prescription  Yes  -      Weight  3  -      Reps  10-15  -      Time  5 Minutes  -        NuStep   Level  2  -      SPM  61  -      Minutes  17  -      METs  1.6  -        Arm Ergometer   Level  1.9  -      Watts  15  -      RPM  45  -      Minutes  22  -      METs  2  -        Home Exercise Plan   Plans to continue exercise at  -  Home (comment)      Frequency  -  Add 2 additional days to program exercise sessions.      Initial Home Exercises Provided  -  05/18/18         Exercise Comments:  Exercise Comments    Row Name 05/08/18 1242 05/25/18 1433 06/08/18 1454 07/08/18 0739 08/04/18 0801   Exercise Comments  Patient has just started CR and will be propressed accoridngly with her exercise goals.  Patient is doing well in CR. patient has maintained her levels so far and will be progressed further by next 2 week progression. Patient has been weak some days and having glucose problems also .   Patient has been doing well in CR. patient has increased her level on the arm ergometer to 1.4 and has also increased the size of her dumbbells to 2lbs. Patient has been impressing [place] all in CR.   Patient is gaining mobility slowly due to her liver disease is progressing which make her unsteady on her feet at times and weak at times. Overall she is doing better and enjoys coming to the program. she enjoys the fellowship.   Patient is gaining mobility slowly due to her liver disease is progressing which make her unsteady on her feet at times and weak at times. Overall she is doing better and enjoys coming to the program. she enjoys the fellowship.       Exercise Goals and Review:  Exercise Goals    Row Name 05/05/18 1533             Exercise Goals   Increase Physical Activity  Yes       Intervention  Provide advice, education, support and counseling about physical activity/exercise needs.;Develop an individualized exercise prescription for aerobic and  resistive training based on initial evaluation findings, risk stratification, comorbidities and participant's personal goals.       Expected Outcomes  Short Term: Attend rehab on a regular basis to increase amount of physical activity.       Increase Strength and Stamina  Yes       Intervention  Provide advice, education, support and counseling about physical activity/exercise needs.;Develop an individualized exercise prescription for aerobic and resistive training based on initial evaluation findings, risk stratification, comorbidities and participant's personal goals.       Expected Outcomes  Short Term: Increase workloads from initial exercise prescription for resistance, speed, and METs.       Able to understand and use rate of perceived exertion (RPE) scale  Yes       Intervention  Provide education and explanation on how to use RPE scale       Expected Outcomes  Long Term:  Able to use RPE to guide intensity level when exercising independently;Short Term: Able to use RPE daily in rehab to express subjective intensity level       Able to understand and use Dyspnea scale  Yes       Intervention  Provide education and explanation on how to use Dyspnea scale       Expected Outcomes  Short Term: Able to use Dyspnea scale daily in rehab to express subjective sense of shortness of breath during exertion;Long Term: Able to use Dyspnea scale to guide intensity level when exercising independently       Knowledge and understanding of Target Heart Rate Range (THRR)  Yes       Intervention  Provide education and explanation of THRR including how the numbers were predicted and where they are located for reference       Expected Outcomes  Short Term: Able to state/look up THRR       Able to check pulse independently  Yes       Intervention  Provide education and demonstration on how to check pulse in carotid and radial arteries.;Review the importance of being able to check your own pulse for safety during  independent exercise  Expected Outcomes  Short Term: Able to explain why pulse checking is important during independent exercise;Long Term: Able to check pulse independently and accurately       Understanding of Exercise Prescription  Yes       Intervention  Provide education, explanation, and written materials on patient's individual exercise prescription       Expected Outcomes  Long Term: Able to explain home exercise prescription to exercise independently;Short Term: Able to explain program exercise prescription          Exercise Goals Re-Evaluation : Exercise Goals Re-Evaluation    Row Name 05/18/18 1503 06/12/18 1430 07/08/18 0735 08/04/18 0756       Exercise Goal Re-Evaluation   Exercise Goals Review  Increase Physical Activity;Increase Strength and Stamina;Able to understand and use rate of perceived exertion (RPE) scale;Able to understand and use Dyspnea scale;Understanding of Exercise Prescription;Able to check pulse independently;Knowledge and understanding of Target Heart Rate Range (THRR)  Increase Physical Activity;Increase Strength and Stamina;Able to understand and use rate of perceived exertion (RPE) scale;Able to understand and use Dyspnea scale;Understanding of Exercise Prescription;Able to check pulse independently;Knowledge and understanding of Target Heart Rate Range (THRR)  Increase Physical Activity  Increase Physical Activity;Increase Strength and Stamina;Able to check pulse independently;Understanding of Exercise Prescription    Comments  Patient has only been to CR for 5 sessions. Patient will be progressed to meet her exercise related goals in time.   Patient has been doing very well in CR. Patient has stated to me that she has gained better mobilty from the program and she is going to talk to her doctor about being able to drive again.  Patient has stated that she is gaining more mobility but realizes that she will not be able to attain her gaol of driving on her own.  She is a little more independent. Her liver disease is progressing and is limiting some of her progress.   Patient is able to exercise and is consistent in that regard. Due to her increasing weight gain because of her liver disease we have not been able to progress her any faster. She says that the program is helping her and making her upper body stronger. She is attending so that she can get strong encough for her liver transplant.     Expected Outcomes  Patient wishes to gain mobility and to be able to drive again.   Patient wishes to gain mobility and to be able to drive again.   Patient still wants to gain mobility.   Patient still wants to gain mobility.         Discharge Exercise Prescription (Final Exercise Prescription Changes): Exercise Prescription Changes - 07/31/18 1510      Home Exercise Plan   Plans to continue exercise at  Home (comment)    Frequency  Add 2 additional days to program exercise sessions.    Initial Home Exercises Provided  05/18/18       Nutrition:  Target Goals: Understanding of nutrition guidelines, daily intake of sodium 1500mg , cholesterol 200mg , calories 30% from fat and 7% or less from saturated fats, daily to have 5 or more servings of fruits and vegetables.  Biometrics: Pre Biometrics - 05/05/18 1534      Pre Biometrics   Height  5\' 2"  (1.575 m)    Weight  142 lb 3.2 oz (64.5 kg)    Waist Circumference  34.5 inches    Hip Circumference  37.5 inches    Waist to Hip  Ratio  0.92 %    BMI (Calculated)  26    Triceps Skinfold  10 mm    % Body Fat  32.6 %    Grip Strength  34.2 kg    Flexibility  12.5 in    Single Leg Stand  0 seconds   Balance Concerns       Nutrition Therapy Plan and Nutrition Goals: Nutrition Therapy & Goals - 08/03/18 1431      Nutrition Therapy   RD appointment deferred  Yes      Personal Nutrition Goals   Personal Goal #2  Patient continues to eat a heart healthy along with low sodium diet.     Additional Goals?   No       Nutrition Assessments: Nutrition Assessments - 05/05/18 1624      MEDFICTS Scores   Pre Score  30       Nutrition Goals Re-Evaluation:   Nutrition Goals Discharge (Final Nutrition Goals Re-Evaluation):   Psychosocial: Target Goals: Acknowledge presence or absence of significant depression and/or stress, maximize coping skills, provide positive support system. Participant is able to verbalize types and ability to use techniques and skills needed for reducing stress and depression.  Initial Review & Psychosocial Screening: Initial Psych Review & Screening - 05/05/18 1627      Initial Review   Current issues with  Current Depression      Family Dynamics   Good Support System?  Yes      Barriers   Psychosocial barriers to participate in program  Psychosocial barriers identified (see note)      Screening Interventions   Interventions  Encouraged to exercise;To provide support and resources with identified psychosocial needs;Provide feedback about the scores to participant    Expected Outcomes  Short Term goal: Identification and review with participant of any Quality of Life or Depression concerns found by scoring the questionnaire.;Long Term goal: The participant improves quality of Life and PHQ9 Scores as seen by post scores and/or verbalization of changes       Quality of Life Scores: Quality of Life - 05/05/18 1535      Quality of Life Scores   Health/Function Pre  12.07 %    Socioeconomic Pre  27.38 %    Psych/Spiritual Pre  23.14 %    Family Pre  18 %    GLOBAL Pre  18.82 %      Scores of 19 and below usually indicate a poorer quality of life in these areas.  A difference of  2-3 points is a clinically meaningful difference.  A difference of 2-3 points in the total score of the Quality of Life Index has been associated with significant improvement in overall quality of life, self-image, physical symptoms, and general health in studies assessing change in  quality of life.  PHQ-9: Recent Review Flowsheet Data    Depression screen [facility] 2/9 05/05/2018 10/29/2017 04/13/2012 01/27/2012   Decreased Interest 2 0 1 1   Down, Depressed, Hopeless 2 0 1 1   PHQ - 2 Score 4 0 2 2   Altered sleeping 2 - - -   Tired, decreased energy 2 - - -   Change in appetite 2 - - -   Feeling bad or failure about yourself  2 - - -   Trouble concentrating 1 - - -   Moving slowly or fidgety/restless 2 - - -   Suicidal thoughts 0 - - -   PHQ-9 Score 15 - - -  Difficult doing work/chores Very difficult - - -     Interpretation of Total Score  Total Score Depression Severity:  1-4 = Minimal depression, 5-9 = Mild depression, 10-14 = Moderate depression, 15-19 = Moderately severe depression, 20-27 = Severe depression   Psychosocial Evaluation and Intervention: Psychosocial Evaluation - 05/05/18 1628      Psychosocial Evaluation & Interventions   Interventions  Encouraged to exercise with the program and follow exercise prescription;Relaxation education;Stress management education    Continue Psychosocial Services   Follow up required by staff       Psychosocial Re-Evaluation: Psychosocial Re-Evaluation    [place] Name 05/21/18 0911 06/15/18 1343 07/15/18 0804 08/03/18 1443       Psychosocial Re-Evaluation   Current issues with  Current Depression;Current Stress Concerns  Current Depression;Current Stress Concerns  Current Depression;Current Stress Concerns  Current Depression;Current Stress Concerns    Comments  Patient's initial QOL score was 18.82 scoring lowest in health and function. Her PHQ-9 score was 15. She is currently being treated with Cymbalta 90 mg daily. She does not want to see a counselor but feels the Cymbalta is managing her depression.   Patient's initial QOL score was 18.82 scoring lowest in health and function. Her PHQ-9 score was 15. She is currently being treated with Cymbalta 90 mg daily. She does not want to see a counselor but feels the  Cymbalta is managing her depression.   Patient's initial QOL score was 18.82 scoring lowest in health and function. Her PHQ-9 score was 15. She is currently being treated with Cymbalta 90 mg daily. She does not want to see a counselor but feels the Cymbalta is managing her depression.   Patient's initial QOL score was 18.82 scoring lowest in health and function. Her PHQ-9 score was 15. She is currently being treated with Cymbalta 90 mg daily. She does not want to see a counselor but feels the Cymbalta is managing her depression.     Expected Outcomes  Patient will have improved QOL and PHQ-9 scores and no additional psychosocial barriers identified at discharge.   Patient will have improved QOL and PHQ-9 scores and no additional psychosocial barriers identified at discharge.   Patient will have improved QOL and PHQ-9 scores and no additional psychosocial barriers identified at discharge.   Patient will have improved QOL and PHQ-9 scores and no additional psychosocial barriers identified at discharge.     Interventions  Stress management education;Encouraged to attend Cardiac Rehabilitation for the exercise;Relaxation education  Stress management education;Encouraged to attend Cardiac Rehabilitation for the exercise;Relaxation education  Stress management education;Encouraged to attend Cardiac Rehabilitation for the exercise;Relaxation education  Stress management education;Encouraged to attend Cardiac Rehabilitation for the exercise;Relaxation education    Comments  Patient's current stress concerns are her health and her lack of independence.   Patient's current stress concerns are her health and her lack of independence.   Patient's current stress concerns are her health and her lack of independence.   Patient's current stress concerns are her health and her lack of independence.       Initial Review   Source of Stress Concerns  Chronic Illness  Chronic Illness  Chronic Illness  Chronic Illness        Psychosocial Discharge (Final Psychosocial Re-Evaluation): Psychosocial Re-Evaluation - 08/03/18 1443      Psychosocial Re-Evaluation   Current issues with  Current Depression;Current Stress Concerns    Comments  Patient's initial QOL score was 18.82 scoring lowest in health and function. Her  PHQ-9 score was 15. She is currently being treated with Cymbalta 90 mg daily. She does not want to see a counselor but feels the Cymbalta is managing her depression.     Expected Outcomes  Patient will have improved QOL and PHQ-9 scores and no additional psychosocial barriers identified at discharge.     Interventions  Stress management education;Encouraged to attend Cardiac Rehabilitation for the exercise;Relaxation education    Comments  Patient's current stress concerns are her health and her lack of independence.       Initial Review   Source of Stress Concerns  Chronic Illness       Vocational Rehabilitation: Provide vocational rehab assistance to qualifying candidates.   Vocational Rehab Evaluation & Intervention: Vocational Rehab - 05/05/18 1612      Initial Vocational Rehab Evaluation & Intervention   Assessment shows need for Vocational Rehabilitation  No       Education: Education Goals: Education classes will be provided on a weekly basis, covering required topics. Participant will state understanding/return demonstration of topics presented.  Learning Barriers/Preferences: Learning Barriers/Preferences - 05/05/18 1610      Learning Barriers/Preferences   Learning Barriers  None    Learning Preferences  Written Material;Pictoral;Computer/Internet       Education Topics: Hypertension, Hypertension Reduction -Define heart disease and high blood pressure. Discus how high blood pressure affects the body and ways to reduce high blood pressure.   Exercise and Your Heart -Discuss why it is important to exercise, the FITT principles of exercise, normal and abnormal responses  to exercise, and how to exercise safely.   CARDIAC REHAB PHASE II EXERCISE from 07/29/2018 in [place]  Date  07/22/18  Educator  D. Latecia Miler  Instruction Review Code  2- Demonstrated Understanding      Angina -Discuss definition of angina, causes of angina, treatment of angina, and how to decrease risk of having angina.   CARDIAC REHAB PHASE II EXERCISE from 07/29/2018 in [place]  Date  07/29/18  Educator  D. Dredyn Gubbels  Instruction Review Code  2- Demonstrated Understanding      Cardiac Medications -Review what the following cardiac medications are used for, how they affect the body, and side effects that may occur when taking the medications.  Medications include Aspirin, Beta blockers, calcium channel blockers, ACE Inhibitors, angiotensin receptor blockers, diuretics, digoxin, and antihyperlipidemics.   Congestive Heart Failure -Discuss the definition of CHF, how to live with CHF, the signs and symptoms of CHF, and how keep track of weight and sodium intake.   CARDIAC REHAB PHASE II EXERCISE from 07/29/2018 in [place]  Date  05/13/18  Educator  DC  Instruction Review Code  2- Demonstrated Understanding      Heart Disease and Intimacy -Discus the effect sexual activity has on the heart, how changes occur during intimacy as we age, and safety during sexual activity.   Smoking Cessation / COPD -Discuss different methods to quit smoking, the health benefits of quitting smoking, and the definition of COPD.   Nutrition I: Fats -Discuss the types of cholesterol, what cholesterol does to the heart, and how cholesterol levels can be controlled.   CARDIAC REHAB PHASE II EXERCISE from 07/29/2018 in [place]  Date  06/10/18  Educator  DJ  Instruction Review Code  2- Demonstrated Understanding      Nutrition II: Labels -Discuss the different components of food labels and how to read food  label   CARDIAC  REHAB PHASE II EXERCISE from 07/29/2018 in [place]  Date  06/03/18  Educator  DC  Instruction Review Code  2- Demonstrated Understanding      Heart Parts/Heart Disease and PAD -Discuss the anatomy of the heart, the pathway of blood circulation through the heart, and these are affected by heart disease.   CARDIAC REHAB PHASE II EXERCISE from 07/29/2018 in [place]  Date  06/17/18  Educator  DC  Instruction Review Code  2- Demonstrated Understanding      Stress I: Signs and Symptoms -Discuss the causes of stress, how stress may lead to anxiety and depression, and ways to limit stress.   CARDIAC REHAB PHASE II EXERCISE from 07/29/2018 in [place]  Date  06/24/18  Educator  D. Lovenia Debruler  Instruction Review Code  2- Demonstrated Understanding      Stress II: Relaxation -Discuss different types of relaxation techniques to limit stress.   CARDIAC REHAB PHASE II EXERCISE from 07/29/2018 in [place]  Date  07/01/18  Educator  DC  Instruction Review Code  2- Demonstrated Understanding      Warning Signs of Stroke / TIA -Discuss definition of a stroke, what the signs and symptoms are of a stroke, and how to identify when someone is having stroke.   Knowledge Questionnaire Score: Knowledge Questionnaire Score - 05/05/18 1610      Knowledge Questionnaire Score   Pre Score  22/24       Core Components/Risk Factors/Patient Goals at Admission: Personal Goals and Risk Factors at Admission - 05/05/18 1624      Core Components/Risk Factors/Patient Goals on Admission    Weight Management  Weight Maintenance    Diabetes  Yes    Intervention  Provide education about signs/symptoms and action to take for hypo/hyperglycemia.    Expected Outcomes  Short Term: Participant verbalizes understanding of the signs/symptoms and immediate care of hyper/hypoglycemia, proper foot care and  importance of medication, aerobic/resistive exercise and nutrition plan for blood glucose control.;Long Term: Attainment of HbA1C < 7%.    Stress  Yes    Intervention  Offer individual and/or small group education and counseling on adjustment to heart disease, stress management and health-related lifestyle change. Teach and support self-help strategies.    Expected Outcomes  Short Term: Participant demonstrates changes in health-related behavior, relaxation and other stress management skills, ability to obtain effective social support, and compliance with psychotropic medications if prescribed.;Long Term: Emotional wellbeing is indicated by absence of clinically significant psychosocial distress or social isolation.    Personal Goal Other  Yes    Personal Goal  Gain more mobility, Drive again, be more independent    Intervention  Attend CR 3 x week and supplement at home exercise 2 x week.     Expected Outcomes  Achieve personal goals.        Core Components/Risk Factors/Patient Goals Review:  Goals and Risk Factor Review    Row Name 05/21/18 0902 06/15/18 1337 07/15/18 0800 08/03/18 1439       Core Components/Risk Factors/Patient Goals Review   Personal Goals Review  Diabetes;Stress;Weight Management/Obesity Increase mobility; drive again; more independent.   Diabetes;Stress;Weight Management/Obesity Get more mobility; drive again; be more independent. Get liver transplant.   Diabetes;Stress;Weight Management/Obesity Gain more mobility; drive again; be more independent; Get liver transplant.  Diabetes;Stress;Weight Management/Obesity Gain more mobility; be more independent; get strong enough to go on liver transplant list.     Review  Patient has completed 7 sessions gaining 7.5 lbs since she started the program. She has cirrohosis of the liver which she attribtues to her weight gain. She hopes to get a transplant. She is currently on the transplant list. She is doing well in the program with some  progression. She says she does feel a little stronger since she started and hopes to continue to improve as she continues the program. She says her husband sees a difference in her appearance and her activity since she has been attending. Her last A1C was 11/10/17 was 7.5. Her fasting readings reported during sessions average 200. Will continue to monitor for progress.   Patient has completed 17 sessions losing 7 lbs since he started the program. She continues to do well in the program with progression. She says [facility] transplant team continue to be in contact with her and she is hoping to go in the list. She has not recent A1C's on file. Her reported glucose readings have been labile. She says she feels more mobile and feels her strength has increased. She says her husband has noticed she is more independent and is doing more around the house. Will continue to monitor for progress.   Patient has completed 25 sessions gaining 20 lbs since her initial visit due to her liver disease. She was admitted to the hospital 07/06/18 and transported to [facility] with pneumonia. She is still admitted. She continues to attend the program with some progression. She has no recent A1C's on file. Her reported glucose readings remain labile. She says she feels stronger some days but she has multiple chronic issues that impeded her progress in the program. Will conitnue to monitor for progress.   Patient has completed 32 sessions gaining 41 lbs since she started the program mainly from acities due to her liver disease. She is doing as well as she can in the program with some progression. She is scheduled to see the transplant team at [facility] today for further evaluation for liver transplant. She has no recent A1C labs but her reported fastiing glucose readings are labile. She continues to say she feels stronger but her chronic  issues continue to impede her progress. Her ambulation has become more unsteady and she is more SOB now due to her  increasing acities. Will continue to monitor for progress.     Expected Outcomes  Patient will continue to attend sessions and complete the program meeting her personal needs.   Patient will continue to attend sessions and complete the program meeting her personal needs.   Patient will continue to attend sessions and complete the program meeting her personal needs.   Patient will continue to attend sessions and complete the program meeting her personal needs.        Core Components/Risk Factors/Patient Goals at Discharge (Final Review):  Goals and Risk Factor Review - 08/03/18 1439      Core Components/Risk Factors/Patient Goals Review   Personal Goals Review  Diabetes;Stress;Weight Management/Obesity   Gain more mobility; be more independent; get strong enough to go on liver transplant list.    Review  Patient has completed 32 sessions gaining 41 lbs since she started the program mainly from acities due to her liver disease. She is doing as well as she can in the program with some progression. She is scheduled to see the transplant team at [facility] today for further evaluation for liver transplant. She has no recent A1C labs but her reported fastiing glucose readings are labile. She continues  to say she feels stronger but her chronic  issues continue to impede her progress. Her ambulation has become more unsteady and she is more SOB now due to her increasing acities. Will continue to monitor for progress.     Expected Outcomes  Patient will continue to attend sessions and complete the program meeting her personal needs.        ITP Comments: ITP Comments    Row Name 05/05/18 1613           ITP Comments  Mrs. Westervelt is a pleasant 67 year old patient. She has been through the program in 2012 due to previous heart events. She has cirrhosis of liver non alcohol induced. She has poor balance. She is very egaer to get started to regain her strength.           Comments: ITP REVIEW Patient is doing  well as to be expected in the program with her liver disease progressing. Will continue to monitor for progress.

## 2018-08-05 ENCOUNTER — Encounter (HOSPITAL_COMMUNITY): Payer: Medicare Other

## 2018-08-05 ENCOUNTER — Ambulatory Visit (HOSPITAL_COMMUNITY): Payer: Medicare Other

## 2018-08-07 ENCOUNTER — Ambulatory Visit (HOSPITAL_COMMUNITY): Payer: Medicare Other

## 2018-08-07 ENCOUNTER — Encounter (HOSPITAL_COMMUNITY): Payer: Medicare Other

## 2018-08-07 ENCOUNTER — Ambulatory Visit: Payer: Medicare Other | Admitting: Podiatry

## 2018-08-10 ENCOUNTER — Encounter (HOSPITAL_COMMUNITY): Payer: Medicare Other

## 2018-08-12 ENCOUNTER — Encounter (HOSPITAL_COMMUNITY): Payer: Medicare Other

## 2018-08-12 ENCOUNTER — Ambulatory Visit (HOSPITAL_COMMUNITY): Payer: Medicare Other

## 2018-08-14 ENCOUNTER — Ambulatory Visit (HOSPITAL_COMMUNITY): Payer: Medicare Other

## 2018-08-14 ENCOUNTER — Encounter (HOSPITAL_COMMUNITY): Payer: Medicare Other

## 2018-08-17 ENCOUNTER — Ambulatory Visit (HOSPITAL_COMMUNITY): Payer: Medicare Other

## 2018-08-19 ENCOUNTER — Ambulatory Visit (HOSPITAL_COMMUNITY): Payer: Medicare Other

## 2018-08-21 ENCOUNTER — Ambulatory Visit (HOSPITAL_COMMUNITY): Payer: Medicare Other

## 2018-08-24 ENCOUNTER — Ambulatory Visit (HOSPITAL_COMMUNITY): Payer: Medicare Other

## 2018-08-26 ENCOUNTER — Ambulatory Visit (HOSPITAL_COMMUNITY): Payer: Medicare Other

## 2018-08-26 NOTE — Progress Notes (Signed)
Discharge Progress Report  Patient Details  Name: Denise Cuevas MRN: [id number] Date of Birth: 1951-09-28 Referring Provider:     [facility] from 05/05/2018 in [place]  Referring Provider  Dr. Servando Snare       Number of Visits: 32  Reason for Discharge:  Early Exit:  Illness.  Smoking History:  Social History   Tobacco Use  Smoking Status Never Smoker  Smokeless Tobacco Never Used    Diagnosis:  S/P AVR  S/P MVR (mitral valve replacement)  ADL UCSD:   Initial Exercise Prescription: Initial Exercise Prescription - 05/05/18 1500      Date of Initial Exercise RX and Referring Provider   Date  05/05/18    Referring Provider  Dr. Servando Snare      NuStep   Level  1    SPM  65    Minutes  15    METs  1.8      Arm Ergometer   Level  1    Watts  12    RPM  15    Minutes  20    METs  1.9      Prescription Details   Frequency (times per week)  3    Duration  Progress to 30 minutes of continuous aerobic without signs/symptoms of physical distress      Intensity   THRR 40-80% of Max Heartrate  [phone]    Ratings of Perceived Exertion  11-13    Perceived Dyspnea  0-4      Progression   Progression  Continue progressive overload as per policy without signs/symptoms or physical distress.      Resistance Training   Training Prescription  Yes    Weight  1    Reps  10-15       Discharge Exercise Prescription (Final Exercise Prescription Changes): Exercise Prescription Changes - 08/14/18 0700      Response to Exercise   Blood Pressure (Admit)  140/60    Blood Pressure (Exercise)  142/62    Blood Pressure (Exit)  130/60    Heart Rate (Admit)  90 bpm    Heart Rate (Exercise)  101 bpm    Heart Rate (Exit)  90 bpm    Rating of Perceived Exertion (Exercise)  12    Comments  Patient has not been here since 08/03/18. In hospital    Duration  Progress to 30 minutes of  aerobic without signs/symptoms of physical  distress    Intensity  THRR unchanged      Progression   Progression  Continue to progress workloads to maintain intensity without signs/symptoms of physical distress.    Average METs  2.1      Resistance Training   Training Prescription  No    Weight  3    Reps  10-15    Time  5 Minutes      NuStep   Level  2    SPM  69    Minutes  17    METs  1.9      Arm Ergometer   Level  1.9    Watts  18    RPM  54    Minutes  22    METs  2.3      Home Exercise Plan   Plans to continue exercise at  Home (comment)    Frequency  Add 2 additional days to program exercise sessions.    Initial Home Exercises Provided  05/18/18  Functional Capacity: 6 Minute Walk    Row Name 05/05/18 1531         6 Minute Walk   Phase  Initial     Distance  850 feet     Distance % Change  0 %     Distance Feet Change  0 ft     Walk Time  6 minutes     # of Rest Breaks  0     MPH  1.6     METS  2.23     RPE  15     Perceived Dyspnea   12     VO2 Peak  8.42     Symptoms  No     Resting HR  95 bpm     Resting BP  110/50     Resting Oxygen Saturation   100 %     Exercise Oxygen Saturation  during 6 min walk  81 %     Max Ex. HR  92 bpm     Max Ex. BP  140/60     2 Minute Post BP  120/56        Psychological, QOL, Others - Outcomes: PHQ 2/9: Depression screen [facility] 2/9 05/05/2018 10/29/2017 04/13/2012 01/27/2012  Decreased Interest 2 0 1 1  Down, Depressed, Hopeless 2 0 1 1  PHQ - 2 Score 4 0 2 2  Altered sleeping 2 - - -  Tired, decreased energy 2 - - -  Change in appetite 2 - - -  Feeling bad or failure about yourself  2 - - -  Trouble concentrating 1 - - -  Moving slowly or fidgety/restless 2 - - -  Suicidal thoughts 0 - - -  PHQ-9 Score 15 - - -  Difficult doing work/chores Very difficult - - -  Some recent data might be hidden    Quality of Life: Quality of Life - 08/26/18 1240      Quality of Life   Select  Quality of Life      Quality of Life Scores    Health/Function Pre  12.07 %    Health/Function Post  11.2 %    Health/Function % Change  -7.21 %    Socioeconomic Pre  27.38 %    Socioeconomic Post  28 %    Socioeconomic % Change   2.26 %    Psych/Spiritual Pre  23.14 %    Psych/Spiritual Post  17.14 %    Psych/Spiritual % Change  -25.93 %    Family Pre  18 %    Family Post  24 %    Family % Change  33.33 %    GLOBAL Pre  18.82 %    GLOBAL Post  17.45 %    GLOBAL % Change  -7.28 %       Personal Goals: Goals established at orientation with interventions provided to work toward goal. Personal Goals and Risk Factors at Admission - 05/05/18 1624      Core Components/Risk Factors/Patient Goals on Admission    Weight Management  Weight Maintenance    Diabetes  Yes    Intervention  Provide education about signs/symptoms and action to take for hypo/hyperglycemia.    Expected Outcomes  Short Term: Participant verbalizes understanding of the signs/symptoms and immediate care of hyper/hypoglycemia, proper foot care and importance of medication, aerobic/resistive exercise and nutrition plan for blood glucose control.;Long Term: Attainment of HbA1C < 7%.    Stress  Yes  Intervention  Offer individual and/or small group education and counseling on adjustment to heart disease, stress management and health-related lifestyle change. Teach and support self-help strategies.    Expected Outcomes  Short Term: Participant demonstrates changes in health-related behavior, relaxation and other stress management skills, ability to obtain effective social support, and compliance with psychotropic medications if prescribed.;Long Term: Emotional wellbeing is indicated by absence of clinically significant psychosocial distress or social isolation.    Personal Goal Other  Yes    Personal Goal  Gain more mobility, Drive again, be more independent    Intervention  Attend CR 3 x week and supplement at home exercise 2 x week.     Expected Outcomes  Achieve  personal goals.         Personal Goals Discharge: Goals and Risk Factor Review    Row Name 05/21/18 0902 06/15/18 1337 07/15/18 0800 08/03/18 1439 08/26/18 0752     Core Components/Risk Factors/Patient Goals Review   Personal Goals Review  Diabetes;Stress;Weight Management/Obesity Increase mobility; drive again; more independent.   Diabetes;Stress;Weight Management/Obesity Get more mobility; drive again; be more independent. Get liver transplant.   Diabetes;Stress;Weight Management/Obesity Gain more mobility; drive again; be more independent; Get liver transplant.  Diabetes;Stress;Weight Management/Obesity Gain more mobility; be more independent; get strong enough to go on liver transplant list.   Diabetes;Stress;Weight Management/Obesity Gain more mobility; drive again; be more independent.    Review  Patient has completed 7 sessions gaining 7.5 lbs since she started the program. She has cirrohosis of the liver which she attribtues to her weight gain. She hopes to get a transplant. She is currently on the transplant list. She is doing well in the program with some progression. She says she does feel a little stronger since she started and hopes to continue to improve as she continues the program. She says her husband sees a difference in her appearance and her activity since she has been attending. Her last A1C was 11/10/17 was 7.5. Her fasting readings reported during sessions average 200. Will continue to monitor for progress.   Patient has completed 17 sessions losing 7 lbs since he started the program. She continues to do well in the program with progression. She says [facility] transplant team continue to be in contact with her and she is hoping to go in the list. She has not recent A1C's on file. Her reported glucose readings have been labile. She says she feels more mobile and feels her strength has increased. She says her husband has noticed she is more independent and is doing more around the house.  Will continue to monitor for progress.   Patient has completed 25 sessions gaining 20 lbs since her initial visit due to her liver disease. She was admitted to the hospital 07/06/18 and transported to [facility] with pneumonia. She is still admitted. She continues to attend the program with some progression. She has no recent A1C's on file. Her reported glucose readings remain labile. She says she feels stronger some days but she has multiple chronic issues that impeded her progress in the program. Will conitnue to monitor for progress.   Patient has completed 32 sessions gaining 41 lbs since she started the program mainly from acities due to her liver disease. She is doing as well as she can in the program with some progression. She is scheduled to see the transplant team at [facility] today for further evaluation for liver transplant. She has no recent A1C labs but her reported fastiing glucose readings  are labile. She continues to say she feels stronger but her chronic  issues continue to impede her progress. Her ambulation has become more unsteady and she is more SOB now due to her increasing acities. Will continue to monitor for progress.   Patient completed the program with 32 sessions. She was not able to do 36 due illness. She was hospitalized for several days with weakness and acites. Fluid was removed from her abdomen but she says she is too weak to complete the 36 sessions. Graduation documentation was not completed and not exit measurements were completed. Patient says she program did help her and she still hopes to get a liver transplant. She is not able to do an exercise now but hopefully she will get stronger and be able to return to exercise. CR will f/u for one year.    Expected Outcomes  Patient will continue to attend sessions and complete the program meeting her personal needs.   Patient will continue to attend sessions and complete the program meeting her personal needs.   Patient will continue to attend  sessions and complete the program meeting her personal needs.   Patient will continue to attend sessions and complete the program meeting her personal needs.   Patient will be able to get stronger to get liver transplant and continue toward meeting her goals.       Exercise Goals and Review: Exercise Goals    Row Name 05/05/18 1533             Exercise Goals   Increase Physical Activity  Yes       Intervention  Provide advice, education, support and counseling about physical activity/exercise needs.;Develop an individualized exercise prescription for aerobic and resistive training based on initial evaluation findings, risk stratification, comorbidities and participant's personal goals.       Expected Outcomes  Short Term: Attend rehab on a regular basis to increase amount of physical activity.       Increase Strength and Stamina  Yes       Intervention  Provide advice, education, support and counseling about physical activity/exercise needs.;Develop an individualized exercise prescription for aerobic and resistive training based on initial evaluation findings, risk stratification, comorbidities and participant's personal goals.       Expected Outcomes  Short Term: Increase workloads from initial exercise prescription for resistance, speed, and METs.       Able to understand and use rate of perceived exertion (RPE) scale  Yes       Intervention  Provide education and explanation on how to use RPE scale       Expected Outcomes  Long Term:  Able to use RPE to guide intensity level when exercising independently;Short Term: Able to use RPE daily in rehab to express subjective intensity level       Able to understand and use Dyspnea scale  Yes       Intervention  Provide education and explanation on how to use Dyspnea scale       Expected Outcomes  Short Term: Able to use Dyspnea scale daily in rehab to express subjective sense of shortness of breath during exertion;Long Term: Able to use Dyspnea  scale to guide intensity level when exercising independently       Knowledge and understanding of Target Heart Rate Range (THRR)  Yes       Intervention  Provide education and explanation of THRR including how the numbers were predicted and where they are located for reference  Expected Outcomes  Short Term: Able to state/look up THRR       Able to check pulse independently  Yes       Intervention  Provide education and demonstration on how to check pulse in carotid and radial arteries.;Review the importance of being able to check your own pulse for safety during independent exercise       Expected Outcomes  Short Term: Able to explain why pulse checking is important during independent exercise;Long Term: Able to check pulse independently and accurately       Understanding of Exercise Prescription  Yes       Intervention  Provide education, explanation, and written materials on patient's individual exercise prescription       Expected Outcomes  Long Term: Able to explain home exercise prescription to exercise independently;Short Term: Able to explain program exercise prescription          Nutrition & Weight - Outcomes: Pre Biometrics - 05/05/18 1534      Pre Biometrics   Height  5\' 2"  (1.575 m)    Weight  64.5 kg    Waist Circumference  34.5 inches    Hip Circumference  37.5 inches    Waist to Hip Ratio  0.92 %    BMI (Calculated)  26    Triceps Skinfold  10 mm    % Body Fat  32.6 %    Grip Strength  34.2 kg    Flexibility  12.5 in    Single Leg Stand  0 seconds   Balance Concerns       Nutrition: Nutrition Therapy & Goals - 08/03/18 1431      Nutrition Therapy   RD appointment deferred  Yes      Personal Nutrition Goals   Personal Goal #2  Patient continues to eat a heart healthy along with low sodium diet.     Additional Goals?  No       Nutrition Discharge: Nutrition Assessments - 05/05/18 1624      MEDFICTS Scores   Pre Score  30       Education  Questionnaire Score: Knowledge Questionnaire Score - 08/26/18 0752      Knowledge Questionnaire Score   Pre Score  22/24    Post Score  23/24

## 2018-08-26 NOTE — Progress Notes (Signed)
Cardiac Individual Treatment Plan  Patient Details  Name: Denise Cuevas MRN: [id number] Date of Birth: 1951/08/24 Referring Provider:     [place] from 05/05/2018 in [place]  Referring Provider  Dr. Servando Snare      Initial Encounter Date:    CARDIAC REHAB PHASE II ORIENTATION from 05/05/2018 in [place]  Date  05/05/18      Visit Diagnosis: S/P AVR  S/P MVR (mitral valve replacement)  Patient's Home Medications on Admission:  Current Outpatient Medications:  .  albuterol (PROVENTIL HFA;VENTOLIN HFA) 108 (90 BASE) MCG/ACT inhaler, Inhale 2 puffs into the lungs every 6 (six) hours as needed for wheezing or shortness of breath., Disp: 1 Inhaler, Rfl: 2 .  albuterol (PROVENTIL) (2.5 MG/3ML) 0.083% nebulizer solution, Take 3 mLs (2.5 mg total) by nebulization every 2 (two) hours as needed for wheezing., Disp: 75 mL, Rfl: 0 .  DULoxetine (CYMBALTA) 30 MG capsule, Take 30 mg by mouth daily with lunch. In conjunction with one 60 mg capsule to equal a total of 90 milligrams, Disp: , Rfl:  .  DULoxetine (CYMBALTA) 60 MG capsule, Take 60 mg by mouth daily with lunch. In conjunction with one 30 mg capsule to equal a total of 90 milligrams, Disp: , Rfl:  .  furosemide (LASIX) 40 MG tablet, Take 40 mg by mouth daily., Disp: , Rfl:  .  HUMALOG MIX 75/25 KWIKPEN (75-25) 100 UNIT/ML Kwikpen, Inject 25 Units into the skin 2 (two) times daily., Disp: 15 mL, Rfl: 10 .  KRISTALOSE 20 g packet, Take 20 g by mouth 3 (three) times daily., Disp: , Rfl: 3 .  levofloxacin (LEVAQUIN) 750 MG tablet, Take 1 tablet by mouth daily., Disp: , Rfl: 0 .  levothyroxine (SYNTHROID, LEVOTHROID) 50 MCG tablet, TAKE 1 TABLET(50 MCG) BY MOUTH DAILY BEFORE BREAKFAST, Disp: 30 tablet, Rfl: 11 .  methocarbamol (ROBAXIN) 750 MG tablet, Take 1 tablet by mouth as needed., Disp: , Rfl: 0 .  mometasone-formoterol (DULERA) 100-5 MCG/ACT AERO, Inhale 2 puffs  into the lungs 2 (two) times daily as needed for wheezing or shortness of breath., Disp: , Rfl:  .  omeprazole-sodium bicarbonate (ZEGERID) 40-1100 MG per capsule, Take 1 capsule by mouth daily before breakfast., Disp: , Rfl:  .  spironolactone (ALDACTONE) 25 MG tablet, Take 1 tablet (25 mg total) by mouth daily., Disp: 30 tablet, Rfl: 0 .  XIFAXAN 550 MG TABS tablet, Take 550 mg by mouth 2 (two) times daily., Disp: , Rfl: 5  Past Medical History: Past Medical History:  Diagnosis Date  . Angina   . Asthma   . CHF (congestive heart failure) ([place])   . 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 COPD (chronic obstructive pulmonary disease) ([place])   . Coronary artery disease    s/p CABG  . Depression   . Diabetes mellitus    type 2  . Dyspnea   . Edema extremities    consistent edema lower legs, feet, and right quadrant abdominal pain-"goes and comes"  . Family history of adverse reaction to anesthesia    SISTER HAS NAUSEA  . Fibromyalgia   . GERD (gastroesophageal reflux disease)   . H/O hiatal hernia   . Headache(784.0)    occ. generalized  . Heart murmur   . Hypercholesteremia   . Hypertension   . Hypothyroid   . Myocardial infarction [facility]) 11/2011   MI x2- 5'40,0'86 coronary stent(chest pain episode at time)  .  PONV (postoperative nausea and vomiting)     Tobacco Use: Social History   Tobacco Use  Smoking Status Never Smoker  Smokeless Tobacco Never Used    Labs: Recent Review Flowsheet Data    Labs for ITP Cardiac and Pulmonary Rehab Latest Ref Rng & Units 11/13/2017 11/13/2017 11/14/2017 11/15/2017 02/16/2018   Cholestrol 0 - 200 mg/dL - - - - -   LDLCALC 0 - 99 mg/dL - - - - -   HDL >40 mg/dL - - - - -   Trlycerides <150 mg/dL - - - - -   Hemoglobin A1c 4.8 - 5.6 % - - - - -   PHART 7.350 - 7.450 7.415 - - - 7.388   PCO2ART 32.0 - 48.0 mmHg 35.6 - - - 26.9(L)   HCO3 20.0 - 28.0 mmol/L 22.8 - - - 16.2(L)   TCO2 22 - 32 mmol/L 24 23 - - 17(L)   ACIDBASEDEF  0.0 - 2.0 mmol/L 1.0 - - - 8.0(H)   O2SAT % 97.0 - 75.7 67.5 96.0      Capillary Blood Glucose: Lab Results  Component Value Date   GLUCAP 161 (H) 07/09/2018   GLUCAP 156 (H) 07/09/2018   GLUCAP 269 (H) 07/09/2018   GLUCAP 147 (H) 06/10/2018   GLUCAP 165 (H) 02/20/2018     Exercise Target Goals: Exercise Program Goal: Individual exercise prescription set using results from initial 6 min walk test and THRR while considering  patient's activity barriers and safety.   Exercise Prescription Goal: Starting with aerobic activity 30 plus minutes a day, 3 days per week for initial exercise prescription. Provide home exercise prescription and guidelines that participant acknowledges understanding prior to discharge.  Activity Barriers & Risk Stratification: Activity Barriers & Cardiac Risk Stratification - 05/05/18 1531      Activity Barriers & Cardiac Risk Stratification   Activity Barriers  Deconditioning;Balance Concerns;Muscular Weakness    Cardiac Risk Stratification  High       6 Minute Walk: 6 Minute Walk    Row Name 05/05/18 1531         6 Minute Walk   Phase  Initial     Distance  850 feet     Distance % Change  0 %     Distance Feet Change  0 ft     Walk Time  6 minutes     # of Rest Breaks  0     MPH  1.6     METS  2.23     RPE  15     Perceived Dyspnea   12     VO2 Peak  8.42     Symptoms  No     Resting HR  95 bpm     Resting BP  110/50     Resting Oxygen Saturation   100 %     Exercise Oxygen Saturation  during 6 min walk  81 %     Max Ex. HR  92 bpm     Max Ex. BP  140/60     2 Minute Post BP  120/56        Oxygen Initial Assessment:   Oxygen Re-Evaluation:   Oxygen Discharge (Final Oxygen Re-Evaluation):   Initial Exercise Prescription: Initial Exercise Prescription - 05/05/18 1500      Date of Initial Exercise RX and Referring Provider   Date  05/05/18    Referring Provider  Dr. Servando Snare      NuStep   Level  1    SPM  65     Minutes  15    METs  1.8      Arm Ergometer   Level  1    Watts  12    RPM  15    Minutes  20    METs  1.9      Prescription Details   Frequency (times per week)  3    Duration  Progress to 30 minutes of continuous aerobic without signs/symptoms of physical distress      Intensity   THRR 40-80% of Max Heartrate  [phone]    Ratings of Perceived Exertion  11-13    Perceived Dyspnea  0-4      Progression   Progression  Continue progressive overload as per policy without signs/symptoms or physical distress.      Resistance Training   Training Prescription  Yes    Weight  1    Reps  10-15       Perform Capillary Blood Glucose checks as needed.  Exercise Prescription Changes:  Exercise Prescription Changes    Row Name 05/08/18 1200 05/25/18 1400 06/08/18 1400 06/26/18 1700 07/08/18 0700     Response to Exercise   Blood Pressure (Admit)  130/62  110/54  120/68  118/84  120/58   Blood Pressure (Exercise)  120/70  140/60  120/50  124/68  130/54   Blood Pressure (Exit)  108/62  100/52  126/64  114/64  102/48   Heart Rate (Admit)  95 bpm  92 bpm  85 bpm  97 bpm  92 bpm   Heart Rate (Exercise)  101 bpm  105 bpm  90 bpm  112 bpm  107 bpm   Heart Rate (Exit)  100 bpm  101 bpm  89 bpm  103 bpm  98 bpm   Rating of Perceived Exertion (Exercise)  13  12  10  13  13    Duration  Progress to 30 minutes of  aerobic without signs/symptoms of physical distress  Progress to 30 minutes of  aerobic without signs/symptoms of physical distress  Progress to 30 minutes of  aerobic without signs/symptoms of physical distress  Progress to 30 minutes of  aerobic without signs/symptoms of physical distress  Progress to 30 minutes of  aerobic without signs/symptoms of physical distress   Intensity  THRR New 119-132-144  THRR New 118-130-143  THRR New 113-128-142  THRR unchanged  THRR unchanged     Progression   Progression  Continue to progress workloads to maintain intensity without signs/symptoms  of physical distress.  Continue to progress workloads to maintain intensity without signs/symptoms of physical distress.  Continue to progress workloads to maintain intensity without signs/symptoms of physical distress.  Continue to progress workloads to maintain intensity without signs/symptoms of physical distress.  Continue to progress workloads to maintain intensity without signs/symptoms of physical distress.   Average METs  -  -  -  -  2.05     Resistance Training   Training Prescription  Yes  Yes  Yes  Yes  Yes   Weight  1  1  2  2  2  May not be able to progress in weights d/t patients weakness   Reps  10-15  10-15  10-15  10-15  10-15   Time  -  -  -  -  5 Minutes     NuStep   Level  1  1  1  2  2    SPM  57  85  64  68  70   Minutes  15  15  15  15  15    METs  1.6  1.8  1.7  1.7  1.9     Arm Ergometer   Level  1  1  1.4  1.5  1.7   Watts  11  12  10  12  16    RPM  15  15  15  16   53   Minutes  20  20  20  20  20    METs  1.8  1.9  1.7  1.9  2.2     Home Exercise Plan   Plans to continue exercise at  -  -  -  Home (comment)  Home (comment)   Frequency  -  -  -  Add 2 additional days to program exercise sessions.  Add 2 additional days to program exercise sessions.   Initial Home Exercises Provided  -  -  -  05/18/18  05/18/18   Row Name 07/31/18 1500 07/31/18 1510 08/14/18 0700         Response to Exercise   Blood Pressure (Admit)  124/68  -  140/60     Blood Pressure (Exercise)  130/60  -  142/62     Blood Pressure (Exit)  120/60  -  130/60     Heart Rate (Admit)  89 bpm  -  90 bpm     Heart Rate (Exercise)  103 bpm  -  101 bpm     Heart Rate (Exit)  96 bpm  -  90 bpm     Rating of Perceived Exertion (Exercise)  12  -  12     Comments  -  -  Patient has not been here since 08/03/18. In hospital     Duration  Progress to 30 minutes of  aerobic without signs/symptoms of physical distress  -  Progress to 30 minutes of  aerobic without signs/symptoms of physical distress      Intensity  THRR unchanged  -  THRR unchanged       Progression   Progression  Continue to progress workloads to maintain intensity without signs/symptoms of physical distress.  -  Continue to progress workloads to maintain intensity without signs/symptoms of physical distress.     Average METs  1.8  -  2.1       Resistance Training   Training Prescription  Yes  -  No     Weight  3  -  3     Reps  10-15  -  10-15     Time  5 Minutes  -  5 Minutes       NuStep   Level  2  -  2     SPM  61  -  69     Minutes  17  -  17     METs  1.6  -  1.9       Arm Ergometer   Level  1.9  -  1.9     Watts  15  -  18     RPM  45  -  54     Minutes  22  -  22     METs  2  -  2.3       Home Exercise Plan   Plans to continue exercise at  -  Home (comment)  Home (comment)  Frequency  -  Add 2 additional days to program exercise sessions.  Add 2 additional days to program exercise sessions.     Initial Home Exercises Provided  -  05/18/18  05/18/18        Exercise Comments:  Exercise Comments    Row Name 05/08/18 1242 05/25/18 1433 06/08/18 1454 07/08/18 0739 08/04/18 0801   Exercise Comments  Patient has just started CR and will be propressed accoridngly with her exercise goals.   Patient is doing well in CR. patient has maintained her levels so far and will be progressed further by next 2 week progression. Patient has been weak some days and having glucose problems also .   Patient has been doing well in CR. patient has increased her level on the arm ergometer to 1.4 and has also increased the size of her dumbbells to 2lbs. Patient has been impressing [place] all in CR.   Patient is gaining mobility slowly due to her liver disease is progressing which make her unsteady on her feet at times and weak at times. Overall she is doing better and enjoys coming to the program. she enjoys the fellowship.   Patient is gaining mobility slowly due to her liver disease is progressing which make her unsteady on her  feet at times and weak at times. Overall she is doing better and enjoys coming to the program. she enjoys the fellowship.    [place] Name 08/14/18 0731 08/24/18 1345         Exercise Comments  Patient has been in the hospital since 08/03/18 due to liver disease and fluid retention.   Patient is not going to return.          Exercise Goals and Review:  Exercise Goals    Row Name 05/05/18 1533             Exercise Goals   Increase Physical Activity  Yes       Intervention  Provide advice, education, support and counseling about physical activity/exercise needs.;Develop an individualized exercise prescription for aerobic and resistive training based on initial evaluation findings, risk stratification, comorbidities and participant's personal goals.       Expected Outcomes  Short Term: Attend rehab on a regular basis to increase amount of physical activity.       Increase Strength and Stamina  Yes       Intervention  Provide advice, education, support and counseling about physical activity/exercise needs.;Develop an individualized exercise prescription for aerobic and resistive training based on initial evaluation findings, risk stratification, comorbidities and participant's personal goals.       Expected Outcomes  Short Term: Increase workloads from initial exercise prescription for resistance, speed, and METs.       Able to understand and use rate of perceived exertion (RPE) scale  Yes       Intervention  Provide education and explanation on how to use RPE scale       Expected Outcomes  Long Term:  Able to use RPE to guide intensity level when exercising independently;Short Term: Able to use RPE daily in rehab to express subjective intensity level       Able to understand and use Dyspnea scale  Yes       Intervention  Provide education and explanation on how to use Dyspnea scale       Expected Outcomes  Short Term: Able to use Dyspnea scale daily in rehab to express subjective sense of  shortness of breath during exertion;Long  Term: Able to use Dyspnea scale to guide intensity level when exercising independently       Knowledge and understanding of Target Heart Rate Range (THRR)  Yes       Intervention  Provide education and explanation of THRR including how the numbers were predicted and where they are located for reference       Expected Outcomes  Short Term: Able to state/look up THRR       Able to check pulse independently  Yes       Intervention  Provide education and demonstration on how to check pulse in carotid and radial arteries.;Review the importance of being able to check your own pulse for safety during independent exercise       Expected Outcomes  Short Term: Able to explain why pulse checking is important during independent exercise;Long Term: Able to check pulse independently and accurately       Understanding of Exercise Prescription  Yes       Intervention  Provide education, explanation, and written materials on patient's individual exercise prescription       Expected Outcomes  Long Term: Able to explain home exercise prescription to exercise independently;Short Term: Able to explain program exercise prescription          Exercise Goals Re-Evaluation : Exercise Goals Re-Evaluation    Row Name 05/18/18 1503 06/12/18 1430 07/08/18 0735 08/04/18 0756 08/24/18 1335     Exercise Goal Re-Evaluation   Exercise Goals Review  Increase Physical Activity;Increase Strength and Stamina;Able to understand and use rate of perceived exertion (RPE) scale;Able to understand and use Dyspnea scale;Understanding of Exercise Prescription;Able to check pulse independently;Knowledge and understanding of Target Heart Rate Range (THRR)  Increase Physical Activity;Increase Strength and Stamina;Able to understand and use rate of perceived exertion (RPE) scale;Able to understand and use Dyspnea scale;Understanding of Exercise Prescription;Able to check pulse independently;Knowledge and  understanding of Target Heart Rate Range (THRR)  Increase Physical Activity  Increase Physical Activity;Increase Strength and Stamina;Able to check pulse independently;Understanding of Exercise Prescription  Increase Physical Activity;Increase Strength and Stamina   Comments  Patient has only been to CR for 5 sessions. Patient will be progressed to meet her exercise related goals in time.   Patient has been doing very well in CR. Patient has stated to me that she has gained better mobilty from the program and she is going to talk to her doctor about being able to drive again.  Patient has stated that she is gaining more mobility but realizes that she will not be able to attain her gaol of driving on her own. She is a little more independent. Her liver disease is progressing and is limiting some of her progress.   Patient is able to exercise and is consistent in that regard. Due to her increasing weight gain because of her liver disease we have not been able to progress her any faster. She says that the program is helping her and making her upper body stronger. She is attending so that she can get strong encough for her liver transplant.   Patient has decided she is not strong enough to finish out her 4 visits. We will discharge her chart.    Expected Outcomes  Patient wishes to gain mobility and to be able to drive again.   Patient wishes to gain mobility and to be able to drive again.   Patient still wants to gain mobility.   Patient still wants to gain mobility.   Patient still  wants to gain mobility.        Discharge Exercise Prescription (Final Exercise Prescription Changes): Exercise Prescription Changes - 08/14/18 0700      Response to Exercise   Blood Pressure (Admit)  140/60    Blood Pressure (Exercise)  142/62    Blood Pressure (Exit)  130/60    Heart Rate (Admit)  90 bpm    Heart Rate (Exercise)  101 bpm    Heart Rate (Exit)  90 bpm    Rating of Perceived Exertion (Exercise)  12     Comments  Patient has not been here since 08/03/18. In hospital    Duration  Progress to 30 minutes of  aerobic without signs/symptoms of physical distress    Intensity  THRR unchanged      Progression   Progression  Continue to progress workloads to maintain intensity without signs/symptoms of physical distress.    Average METs  2.1      Resistance Training   Training Prescription  No    Weight  3    Reps  10-15    Time  5 Minutes      NuStep   Level  2    SPM  69    Minutes  17    METs  1.9      Arm Ergometer   Level  1.9    Watts  18    RPM  54    Minutes  22    METs  2.3      Home Exercise Plan   Plans to continue exercise at  Home (comment)    Frequency  Add 2 additional days to program exercise sessions.    Initial Home Exercises Provided  05/18/18       Nutrition:  Target Goals: Understanding of nutrition guidelines, daily intake of sodium 1500mg , cholesterol 200mg , calories 30% from fat and 7% or less from saturated fats, daily to have 5 or more servings of fruits and vegetables.  Biometrics: Pre Biometrics - 05/05/18 1534      Pre Biometrics   Height  5\' 2"  (1.575 m)    Weight  64.5 kg    Waist Circumference  34.5 inches    Hip Circumference  37.5 inches    Waist to Hip Ratio  0.92 %    BMI (Calculated)  26    Triceps Skinfold  10 mm    % Body Fat  32.6 %    Grip Strength  34.2 kg    Flexibility  12.5 in    Single Leg Stand  0 seconds   Balance Concerns       Nutrition Therapy Plan and Nutrition Goals: Nutrition Therapy & Goals - 08/03/18 1431      Nutrition Therapy   RD appointment deferred  Yes      Personal Nutrition Goals   Personal Goal #2  Patient continues to eat a heart healthy along with low sodium diet.     Additional Goals?  No       Nutrition Assessments: Nutrition Assessments - 05/05/18 1624      MEDFICTS Scores   Pre Score  30       Nutrition Goals Re-Evaluation:   Nutrition Goals Discharge (Final Nutrition Goals  Re-Evaluation):   Psychosocial: Target Goals: Acknowledge presence or absence of significant depression and/or stress, maximize coping skills, provide positive support system. Participant is able to verbalize types and ability to use techniques and skills needed for reducing stress and depression.  Initial Review & Psychosocial Screening: Initial Psych Review & Screening - 05/05/18 1627      Initial Review   Current issues with  Current Depression      Family Dynamics   Good Support System?  Yes      Barriers   Psychosocial barriers to participate in program  Psychosocial barriers identified (see note)      Screening Interventions   Interventions  Encouraged to exercise;To provide support and resources with identified psychosocial needs;Provide feedback about the scores to participant    Expected Outcomes  Short Term goal: Identification and review with participant of any Quality of Life or Depression concerns found by scoring the questionnaire.;Long Term goal: The participant improves quality of Life and PHQ9 Scores as seen by post scores and/or verbalization of changes       Quality of Life Scores: Quality of Life - 08/26/18 1240      Quality of Life   Select  Quality of Life      Quality of Life Scores   Health/Function Pre  12.07 %    Health/Function Post  11.2 %    Health/Function % Change  -7.21 %    Socioeconomic Pre  27.38 %    Socioeconomic Post  28 %    Socioeconomic % Change   2.26 %    Psych/Spiritual Pre  23.14 %    Psych/Spiritual Post  17.14 %    Psych/Spiritual % Change  -25.93 %    Family Pre  18 %    Family Post  24 %    Family % Change  33.33 %    GLOBAL Pre  18.82 %    GLOBAL Post  17.45 %    GLOBAL % Change  -7.28 %      Scores of 19 and below usually indicate a poorer quality of life in these areas.  A difference of  2-3 points is a clinically meaningful difference.  A difference of 2-3 points in the total score of the Quality of Life Index has  been associated with significant improvement in overall quality of life, self-image, physical symptoms, and general health in studies assessing change in quality of life.  PHQ-9: Recent Review Flowsheet Data    Depression screen [facility] 2/9 05/05/2018 10/29/2017 04/13/2012 01/27/2012   Decreased Interest 2 0 1 1   Down, Depressed, Hopeless 2 0 1 1   PHQ - 2 Score 4 0 2 2   Altered sleeping 2 - - -   Tired, decreased energy 2 - - -   Change in appetite 2 - - -   Feeling bad or failure about yourself  2 - - -   Trouble concentrating 1 - - -   Moving slowly or fidgety/restless 2 - - -   Suicidal thoughts 0 - - -   PHQ-9 Score 15 - - -   Difficult doing work/chores Very difficult - - -     Interpretation of Total Score  Total Score Depression Severity:  1-4 = Minimal depression, 5-9 = Mild depression, 10-14 = Moderate depression, 15-19 = Moderately severe depression, 20-27 = Severe depression   Psychosocial Evaluation and Intervention: Psychosocial Evaluation - 08/26/18 1515      Discharge Psychosocial Assessment & Intervention   Comments  Patient continues to have depression at discharge still managed with cymbalata 90 mg daily. She did not complete an exit PHQ-9. Her exit QOL score did decrease by 7.27% at 17.45.  Psychosocial Re-Evaluation: Psychosocial Re-Evaluation    [place] Name 05/21/18 0911 06/15/18 1343 07/15/18 0804 08/03/18 1443       Psychosocial Re-Evaluation   Current issues with  Current Depression;Current Stress Concerns  Current Depression;Current Stress Concerns  Current Depression;Current Stress Concerns  Current Depression;Current Stress Concerns    Comments  Patient's initial QOL score was 18.82 scoring lowest in health and function. Her PHQ-9 score was 15. She is currently being treated with Cymbalta 90 mg daily. She does not want to see a counselor but feels the Cymbalta is managing her depression.   Patient's initial QOL score was 18.82 scoring lowest in health  and function. Her PHQ-9 score was 15. She is currently being treated with Cymbalta 90 mg daily. She does not want to see a counselor but feels the Cymbalta is managing her depression.   Patient's initial QOL score was 18.82 scoring lowest in health and function. Her PHQ-9 score was 15. She is currently being treated with Cymbalta 90 mg daily. She does not want to see a counselor but feels the Cymbalta is managing her depression.   Patient's initial QOL score was 18.82 scoring lowest in health and function. Her PHQ-9 score was 15. She is currently being treated with Cymbalta 90 mg daily. She does not want to see a counselor but feels the Cymbalta is managing her depression.     Expected Outcomes  Patient will have improved QOL and PHQ-9 scores and no additional psychosocial barriers identified at discharge.   Patient will have improved QOL and PHQ-9 scores and no additional psychosocial barriers identified at discharge.   Patient will have improved QOL and PHQ-9 scores and no additional psychosocial barriers identified at discharge.   Patient will have improved QOL and PHQ-9 scores and no additional psychosocial barriers identified at discharge.     Interventions  Stress management education;Encouraged to attend Cardiac Rehabilitation for the exercise;Relaxation education  Stress management education;Encouraged to attend Cardiac Rehabilitation for the exercise;Relaxation education  Stress management education;Encouraged to attend Cardiac Rehabilitation for the exercise;Relaxation education  Stress management education;Encouraged to attend Cardiac Rehabilitation for the exercise;Relaxation education    Comments  Patient's current stress concerns are her health and her lack of independence.   Patient's current stress concerns are her health and her lack of independence.   Patient's current stress concerns are her health and her lack of independence.   Patient's current stress concerns are her health and her lack of  independence.       Initial Review   Source of Stress Concerns  Chronic Illness  Chronic Illness  Chronic Illness  Chronic Illness       Psychosocial Discharge (Final Psychosocial Re-Evaluation): Psychosocial Re-Evaluation - 08/03/18 1443      Psychosocial Re-Evaluation   Current issues with  Current Depression;Current Stress Concerns    Comments  Patient's initial QOL score was 18.82 scoring lowest in health and function. Her PHQ-9 score was 15. She is currently being treated with Cymbalta 90 mg daily. She does not want to see a counselor but feels the Cymbalta is managing her depression.     Expected Outcomes  Patient will have improved QOL and PHQ-9 scores and no additional psychosocial barriers identified at discharge.     Interventions  Stress management education;Encouraged to attend Cardiac Rehabilitation for the exercise;Relaxation education    Comments  Patient's current stress concerns are her health and her lack of independence.       Initial Review   Source of  Stress Concerns  Chronic Illness       Vocational Rehabilitation: Provide vocational rehab assistance to qualifying candidates.   Vocational Rehab Evaluation & Intervention: Vocational Rehab - 05/05/18 1612      Initial Vocational Rehab Evaluation & Intervention   Assessment shows need for Vocational Rehabilitation  No       Education: Education Goals: Education classes will be provided on a weekly basis, covering required topics. Participant will state understanding/return demonstration of topics presented.  Learning Barriers/Preferences: Learning Barriers/Preferences - 05/05/18 1610      Learning Barriers/Preferences   Learning Barriers  None    Learning Preferences  Written Material;Pictoral;Computer/Internet       Education Topics: Hypertension, Hypertension Reduction -Define heart disease and high blood pressure. Discus how high blood pressure affects the body and ways to reduce high blood  pressure.   Exercise and Your Heart -Discuss why it is important to exercise, the FITT principles of exercise, normal and abnormal responses to exercise, and how to exercise safely.   CARDIAC REHAB PHASE II EXERCISE from 07/29/2018 in [place]  Date  07/22/18  Educator  D. Coad  Instruction Review Code  2- Demonstrated Understanding      Angina -Discuss definition of angina, causes of angina, treatment of angina, and how to decrease risk of having angina.   CARDIAC REHAB PHASE II EXERCISE from 07/29/2018 in [place]  Date  07/29/18  Educator  D. Coad  Instruction Review Code  2- Demonstrated Understanding      Cardiac Medications -Review what the following cardiac medications are used for, how they affect the body, and side effects that may occur when taking the medications.  Medications include Aspirin, Beta blockers, calcium channel blockers, ACE Inhibitors, angiotensin receptor blockers, diuretics, digoxin, and antihyperlipidemics.   Congestive Heart Failure -Discuss the definition of CHF, how to live with CHF, the signs and symptoms of CHF, and how keep track of weight and sodium intake.   CARDIAC REHAB PHASE II EXERCISE from 07/29/2018 in [place]  Date  05/13/18  Educator  DC  Instruction Review Code  2- Demonstrated Understanding      Heart Disease and Intimacy -Discus the effect sexual activity has on the heart, how changes occur during intimacy as we age, and safety during sexual activity.   Smoking Cessation / COPD -Discuss different methods to quit smoking, the health benefits of quitting smoking, and the definition of COPD.   Nutrition I: Fats -Discuss the types of cholesterol, what cholesterol does to the heart, and how cholesterol levels can be controlled.   CARDIAC REHAB PHASE II EXERCISE from 07/29/2018 in [place]  Date  06/10/18  Educator  DJ  Instruction  Review Code  2- Demonstrated Understanding      Nutrition II: Labels -Discuss the different components of food labels and how to read food label   [place] from 07/29/2018 in [place]  Date  06/03/18  Educator  DC  Instruction Review Code  2- Demonstrated Understanding      Heart Parts/Heart Disease and PAD -Discuss the anatomy of the heart, the pathway of blood circulation through the heart, and these are affected by heart disease.   CARDIAC REHAB PHASE II EXERCISE from 07/29/2018 in [place]  Date  06/17/18  Educator  DC  Instruction Review Code  2- Demonstrated Understanding      Stress I: Signs and Symptoms -Discuss  the causes of stress, how stress may lead to anxiety and depression, and ways to limit stress.   CARDIAC REHAB PHASE II EXERCISE from 07/29/2018 in [place]  Date  06/24/18  Educator  D. Coad  Instruction Review Code  2- Demonstrated Understanding      Stress II: Relaxation -Discuss different types of relaxation techniques to limit stress.   CARDIAC REHAB PHASE II EXERCISE from 07/29/2018 in [place]  Date  07/01/18  Educator  DC  Instruction Review Code  2- Demonstrated Understanding      Warning Signs of Stroke / TIA -Discuss definition of a stroke, what the signs and symptoms are of a stroke, and how to identify when someone is having stroke.   Knowledge Questionnaire Score: Knowledge Questionnaire Score - 08/26/18 0752      Knowledge Questionnaire Score   Pre Score  22/24    Post Score  23/24       Core Components/Risk Factors/Patient Goals at Admission: Personal Goals and Risk Factors at Admission - 05/05/18 1624      Core Components/Risk Factors/Patient Goals on Admission    Weight Management  Weight Maintenance    Diabetes  Yes    Intervention  Provide education about signs/symptoms and action to take for  hypo/hyperglycemia.    Expected Outcomes  Short Term: Participant verbalizes understanding of the signs/symptoms and immediate care of hyper/hypoglycemia, proper foot care and importance of medication, aerobic/resistive exercise and nutrition plan for blood glucose control.;Long Term: Attainment of HbA1C < 7%.    Stress  Yes    Intervention  Offer individual and/or small group education and counseling on adjustment to heart disease, stress management and health-related lifestyle change. Teach and support self-help strategies.    Expected Outcomes  Short Term: Participant demonstrates changes in health-related behavior, relaxation and other stress management skills, ability to obtain effective social support, and compliance with psychotropic medications if prescribed.;Long Term: Emotional wellbeing is indicated by absence of clinically significant psychosocial distress or social isolation.    Personal Goal Other  Yes    Personal Goal  Gain more mobility, Drive again, be more independent    Intervention  Attend CR 3 x week and supplement at home exercise 2 x week.     Expected Outcomes  Achieve personal goals.        Core Components/Risk Factors/Patient Goals Review:  Goals and Risk Factor Review    Row Name 05/21/18 0902 06/15/18 1337 07/15/18 0800 08/03/18 1439 08/26/18 0752     Core Components/Risk Factors/Patient Goals Review   Personal Goals Review  Diabetes;Stress;Weight Management/Obesity Increase mobility; drive again; more independent.   Diabetes;Stress;Weight Management/Obesity Get more mobility; drive again; be more independent. Get liver transplant.   Diabetes;Stress;Weight Management/Obesity Gain more mobility; drive again; be more independent; Get liver transplant.  Diabetes;Stress;Weight Management/Obesity Gain more mobility; be more independent; get strong enough to go on liver transplant list.   Diabetes;Stress;Weight Management/Obesity Gain more mobility; drive again; be more  independent.    Review  Patient has completed 7 sessions gaining 7.5 lbs since she started the program. She has cirrohosis of the liver which she attribtues to her weight gain. She hopes to get a transplant. She is currently on the transplant list. She is doing well in the program with some progression. She says she does feel a little stronger since she started and hopes to continue to improve as she continues the program. She says her husband sees a difference in her  appearance and her activity since she has been attending. Her last A1C was 11/10/17 was 7.5. Her fasting readings reported during sessions average 200. Will continue to monitor for progress.   Patient has completed 17 sessions losing 7 lbs since he started the program. She continues to do well in the program with progression. She says [facility] transplant team continue to be in contact with her and she is hoping to go in the list. She has not recent A1C's on file. Her reported glucose readings have been labile. She says she feels more mobile and feels her strength has increased. She says her husband has noticed she is more independent and is doing more around the house. Will continue to monitor for progress.   Patient has completed 25 sessions gaining 20 lbs since her initial visit due to her liver disease. She was admitted to the hospital 07/06/18 and transported to [facility] with pneumonia. She is still admitted. She continues to attend the program with some progression. She has no recent A1C's on file. Her reported glucose readings remain labile. She says she feels stronger some days but she has multiple chronic issues that impeded her progress in the program. Will conitnue to monitor for progress.   Patient has completed 32 sessions gaining 41 lbs since she started the program mainly from acities due to her liver disease. She is doing as well as she can in the program with some progression. She is scheduled to see the transplant team at [facility] today for further  evaluation for liver transplant. She has no recent A1C labs but her reported fastiing glucose readings are labile. She continues to say she feels stronger but her chronic  issues continue to impede her progress. Her ambulation has become more unsteady and she is more SOB now due to her increasing acities. Will continue to monitor for progress.   Patient completed the program with 32 sessions. She was not able to do 36 due illness. She was hospitalized for several days with weakness and acites. Fluid was removed from her abdomen but she says she is too weak to complete the 36 sessions. Graduation documentation was not completed and not exit measurements were completed. Patient says she program did help her and she still hopes to get a liver transplant. She is not able to do an exercise now but hopefully she will get stronger and be able to return to exercise. CR will f/u for one year.    Expected Outcomes  Patient will continue to attend sessions and complete the program meeting her personal needs.   Patient will continue to attend sessions and complete the program meeting her personal needs.   Patient will continue to attend sessions and complete the program meeting her personal needs.   Patient will continue to attend sessions and complete the program meeting her personal needs.   Patient will be able to get stronger to get liver transplant and continue toward meeting her goals.       Core Components/Risk Factors/Patient Goals at Discharge (Final Review):  Goals and Risk Factor Review - 08/26/18 0752      Core Components/Risk Factors/Patient Goals Review   Personal Goals Review  Diabetes;Stress;Weight Management/Obesity   Gain more mobility; drive again; be more independent.    Review  Patient completed the program with 32 sessions. She was not able to do 36 due illness. She was hospitalized for several days with weakness and acites. Fluid was removed from her abdomen but she says she is too  weak to  complete the 36 sessions. Graduation documentation was not completed and not exit measurements were completed. Patient says she program did help her and she still hopes to get a liver transplant. She is not able to do an exercise now but hopefully she will get stronger and be able to return to exercise. CR will f/u for one year.     Expected Outcomes  Patient will be able to get stronger to get liver transplant and continue toward meeting her goals.        ITP Comments: ITP Comments    Row Name 05/05/18 1613           ITP Comments  Mrs. Storr is a pleasant 67 year old patient. She has been through the program in 2012 due to previous heart events. She has cirrhosis of liver non alcohol induced. She has poor balance. She is very egaer to get started to regain her strength.           Comments: Patient stopped coming to Cardiac Rehab on 08/03/2018 after completing 32 sessions. She was not able to continue due to weakness from recent hospitalization and chronic illness. Called patient to remind them of the Cardiac Rehab policy that if they do not call or come for 3 consecutive visits that they would be discharged from the CR program. Doctor will be informed.

## 2018-08-28 ENCOUNTER — Ambulatory Visit (HOSPITAL_COMMUNITY): Payer: Medicare Other

## 2018-08-31 ENCOUNTER — Ambulatory Visit (HOSPITAL_COMMUNITY): Payer: Medicare Other

## 2018-09-02 ENCOUNTER — Ambulatory Visit (HOSPITAL_COMMUNITY): Payer: Medicare Other

## 2018-09-04 ENCOUNTER — Ambulatory Visit (HOSPITAL_COMMUNITY): Payer: Medicare Other

## 2018-09-07 ENCOUNTER — Ambulatory Visit (HOSPITAL_COMMUNITY): Payer: Medicare Other

## 2018-09-09 ENCOUNTER — Ambulatory Visit (HOSPITAL_COMMUNITY): Payer: Medicare Other

## 2018-09-21 ENCOUNTER — Inpatient Hospital Stay (HOSPITAL_COMMUNITY)
Admission: EM | Admit: 2018-09-21 | Discharge: 2018-09-29 | DRG: 441 | Disposition: A | Discharge status: Home Health | Payer: Medicare Other | Attending: Internal Medicine | Admitting: Internal Medicine

## 2018-09-21 ENCOUNTER — Emergency Department (HOSPITAL_COMMUNITY): Payer: Medicare Other

## 2018-09-21 ENCOUNTER — Other Ambulatory Visit: Payer: Self-pay

## 2018-09-21 ENCOUNTER — Encounter (HOSPITAL_COMMUNITY): Payer: Self-pay | Admitting: Emergency Medicine

## 2018-09-21 DIAGNOSIS — N179 Acute kidney failure, unspecified: Secondary | ICD-10-CM | POA: Diagnosis present

## 2018-09-21 DIAGNOSIS — R339 Retention of urine, unspecified: Secondary | ICD-10-CM | POA: Diagnosis present

## 2018-09-21 DIAGNOSIS — R338 Other retention of urine: Secondary | ICD-10-CM | POA: Diagnosis present

## 2018-09-21 DIAGNOSIS — Z79890 Hormone replacement therapy (postmenopausal): Secondary | ICD-10-CM

## 2018-09-21 DIAGNOSIS — Z66 Do not resuscitate: Secondary | ICD-10-CM | POA: Diagnosis present

## 2018-09-21 DIAGNOSIS — Z7189 Other specified counseling: Secondary | ICD-10-CM

## 2018-09-21 DIAGNOSIS — K219 Gastro-esophageal reflux disease without esophagitis: Secondary | ICD-10-CM | POA: Diagnosis present

## 2018-09-21 DIAGNOSIS — R4182 Altered mental status, unspecified: Secondary | ICD-10-CM | POA: Diagnosis not present

## 2018-09-21 DIAGNOSIS — Z825 Family history of asthma and other chronic lower respiratory diseases: Secondary | ICD-10-CM

## 2018-09-21 DIAGNOSIS — E871 Hypo-osmolality and hyponatremia: Secondary | ICD-10-CM | POA: Diagnosis present

## 2018-09-21 DIAGNOSIS — I5022 Chronic systolic (congestive) heart failure: Secondary | ICD-10-CM | POA: Diagnosis present

## 2018-09-21 DIAGNOSIS — K7469 Other cirrhosis of liver: Secondary | ICD-10-CM | POA: Diagnosis present

## 2018-09-21 DIAGNOSIS — K7460 Unspecified cirrhosis of liver: Secondary | ICD-10-CM

## 2018-09-21 DIAGNOSIS — I2510 Atherosclerotic heart disease of native coronary artery without angina pectoris: Secondary | ICD-10-CM | POA: Diagnosis present

## 2018-09-21 DIAGNOSIS — Z794 Long term (current) use of insulin: Secondary | ICD-10-CM

## 2018-09-21 DIAGNOSIS — E43 Unspecified severe protein-calorie malnutrition: Secondary | ICD-10-CM | POA: Diagnosis present

## 2018-09-21 DIAGNOSIS — K7200 Acute and subacute hepatic failure without coma: Secondary | ICD-10-CM | POA: Diagnosis not present

## 2018-09-21 DIAGNOSIS — Z6829 Body mass index (BMI) 29.0-29.9, adult: Secondary | ICD-10-CM

## 2018-09-21 DIAGNOSIS — Z951 Presence of aortocoronary bypass graft: Secondary | ICD-10-CM

## 2018-09-21 DIAGNOSIS — E039 Hypothyroidism, unspecified: Secondary | ICD-10-CM | POA: Diagnosis not present

## 2018-09-21 DIAGNOSIS — E119 Type 2 diabetes mellitus without complications: Secondary | ICD-10-CM | POA: Diagnosis present

## 2018-09-21 DIAGNOSIS — I110 Hypertensive heart disease with heart failure: Secondary | ICD-10-CM | POA: Diagnosis present

## 2018-09-21 DIAGNOSIS — Z952 Presence of prosthetic heart valve: Secondary | ICD-10-CM

## 2018-09-21 DIAGNOSIS — Z955 Presence of coronary angioplasty implant and graft: Secondary | ICD-10-CM

## 2018-09-21 DIAGNOSIS — J449 Chronic obstructive pulmonary disease, unspecified: Secondary | ICD-10-CM | POA: Diagnosis present

## 2018-09-21 DIAGNOSIS — Z8249 Family history of ischemic heart disease and other diseases of the circulatory system: Secondary | ICD-10-CM

## 2018-09-21 DIAGNOSIS — K7581 Nonalcoholic steatohepatitis (NASH): Secondary | ICD-10-CM | POA: Diagnosis present

## 2018-09-21 DIAGNOSIS — E1165 Type 2 diabetes mellitus with hyperglycemia: Secondary | ICD-10-CM | POA: Diagnosis present

## 2018-09-21 DIAGNOSIS — D696 Thrombocytopenia, unspecified: Secondary | ICD-10-CM | POA: Diagnosis present

## 2018-09-21 DIAGNOSIS — K2270 Barrett's esophagus without dysplasia: Secondary | ICD-10-CM | POA: Diagnosis present

## 2018-09-21 DIAGNOSIS — IMO0002 Reserved for concepts with insufficient information to code with codable children: Secondary | ICD-10-CM | POA: Diagnosis present

## 2018-09-21 DIAGNOSIS — R188 Other ascites: Secondary | ICD-10-CM

## 2018-09-21 DIAGNOSIS — K7290 Hepatic failure, unspecified without coma: Secondary | ICD-10-CM | POA: Diagnosis present

## 2018-09-21 DIAGNOSIS — K7682 Hepatic encephalopathy: Secondary | ICD-10-CM | POA: Diagnosis present

## 2018-09-21 DIAGNOSIS — E785 Hyperlipidemia, unspecified: Secondary | ICD-10-CM | POA: Diagnosis present

## 2018-09-21 DIAGNOSIS — Z515 Encounter for palliative care: Secondary | ICD-10-CM

## 2018-09-21 DIAGNOSIS — I252 Old myocardial infarction: Secondary | ICD-10-CM

## 2018-09-21 LAB — COMPREHENSIVE METABOLIC PANEL
ALT: 23 U/L (ref 0–44)
AST: 48 U/L — ABNORMAL HIGH (ref 15–41)
Albumin: 2.8 g/dL — ABNORMAL LOW (ref 3.5–5.0)
Alkaline Phosphatase: 144 U/L — ABNORMAL HIGH (ref 38–126)
Anion gap: 14 (ref 5–15)
BUN: 21 mg/dL (ref 8–23)
CHLORIDE: 93 mmol/L — AB (ref 98–111)
CO2: 26 mmol/L (ref 22–32)
CREATININE: 1.53 mg/dL — AB (ref 0.44–1.00)
Calcium: 8.6 mg/dL — ABNORMAL LOW (ref 8.9–10.3)
GFR, EST AFRICAN AMERICAN: 40 mL/min — AB (ref >60)
GFR, EST NON AFRICAN AMERICAN: 35 mL/min — AB (ref >60)
Glucose, Bld: 207 mg/dL — ABNORMAL HIGH (ref 70–99)
Potassium: 3.7 mmol/L (ref 3.5–5.1)
Sodium: 133 mmol/L — ABNORMAL LOW (ref 135–145)
Total Bilirubin: 6 mg/dL — ABNORMAL HIGH (ref 0.3–1.2)
Total Protein: 5.9 g/dL — ABNORMAL LOW (ref 6.5–8.1)

## 2018-09-21 LAB — URINALYSIS, ROUTINE W REFLEX MICROSCOPIC
BILIRUBIN URINE: NEGATIVE
GLUCOSE, UA: 50 mg/dL — AB
HGB URINE DIPSTICK: NEGATIVE
Ketones, ur: NEGATIVE mg/dL
Leukocytes, UA: NEGATIVE
Nitrite: NEGATIVE
PROTEIN: NEGATIVE mg/dL
Specific Gravity, Urine: 1.018 (ref 1.005–1.030)
pH: 5 (ref 5.0–8.0)

## 2018-09-21 LAB — GLUCOSE, CAPILLARY: GLUCOSE-CAPILLARY: 197 mg/dL — AB (ref 70–99)

## 2018-09-21 LAB — CBC
HEMATOCRIT: 34.6 % — AB (ref 36.0–46.0)
Hemoglobin: 11.5 g/dL — ABNORMAL LOW (ref 12.0–15.0)
MCH: 32.3 pg (ref 26.0–34.0)
MCHC: 33.2 g/dL (ref 30.0–36.0)
MCV: 97.2 fL (ref 80.0–100.0)
Platelets: 98 10*3/uL — ABNORMAL LOW (ref 150–400)
RBC: 3.56 MIL/uL — ABNORMAL LOW (ref 3.87–5.11)
RDW: 16.9 % — AB (ref 11.5–15.5)
WBC: 9.7 10*3/uL (ref 4.0–10.5)
nRBC: 0 % (ref 0.0–0.2)

## 2018-09-21 LAB — AMMONIA: AMMONIA: 51 umol/L — AB (ref 9–35)

## 2018-09-21 LAB — LACTIC ACID, PLASMA
LACTIC ACID, VENOUS: 2.8 mmol/L — AB (ref 0.5–1.9)
LACTIC ACID, VENOUS: 2.9 mmol/L — AB (ref 0.5–1.9)

## 2018-09-21 LAB — PROTIME-INR
INR: 1.37
Prothrombin Time: 16.8 seconds — ABNORMAL HIGH (ref 11.4–15.2)

## 2018-09-21 LAB — TSH: TSH: 81.934 u[IU]/mL — ABNORMAL HIGH (ref 0.350–4.500)

## 2018-09-21 LAB — CBG MONITORING, ED: Glucose-Capillary: 182 mg/dL — ABNORMAL HIGH (ref 70–99)

## 2018-09-21 MED ORDER — DULOXETINE HCL 60 MG PO CPEP
60.0000 mg | ORAL_CAPSULE | Freq: Every day | ORAL | Status: DC
  Administered 2018-09-24 – 2018-09-29 (×6): 60 mg via ORAL
  Filled 2018-09-21 (×7): qty 1

## 2018-09-21 MED ORDER — LEVOTHYROXINE SODIUM 50 MCG PO TABS: 50.0000 ug | ORAL_TABLET | Freq: Every day | ORAL | Status: DC

## 2018-09-21 MED ORDER — LACTULOSE 10 GM/15ML PO SOLN
20.0000 g | Freq: Three times a day (TID) | ORAL | Status: DC
  Administered 2018-09-21 – 2018-09-25 (×8): 20 g via ORAL
  Filled 2018-09-21 (×7): qty 30

## 2018-09-21 MED ORDER — PRO-STAT SUGAR FREE PO LIQD
30.0000 mL | Freq: Two times a day (BID) | ORAL | Status: DC
  Administered 2018-09-24 – 2018-09-29 (×10): 30 mL via ORAL
  Filled 2018-09-21 (×16): qty 30

## 2018-09-21 MED ORDER — SODIUM CHLORIDE 0.9% FLUSH: 3.0000 mL | INTRAVENOUS | Status: DC | PRN

## 2018-09-21 MED ORDER — INSULIN ASPART 100 UNIT/ML ~~LOC~~ SOLN
0.0000 [IU] | Freq: Three times a day (TID) | SUBCUTANEOUS | Status: DC
  Administered 2018-09-21: 2 [IU] via SUBCUTANEOUS
  Administered 2018-09-22: 3 [IU] via SUBCUTANEOUS
  Administered 2018-09-22: 2 [IU] via SUBCUTANEOUS

## 2018-09-21 MED ORDER — ONDANSETRON HCL 4 MG/2ML IJ SOLN: 4.0000 mg | Freq: Four times a day (QID) | INTRAMUSCULAR | Status: DC | PRN

## 2018-09-21 MED ORDER — ALBUTEROL SULFATE (2.5 MG/3ML) 0.083% IN NEBU: 2.5000 mg | INHALATION_SOLUTION | RESPIRATORY_TRACT | Status: DC | PRN

## 2018-09-21 MED ORDER — ORAL CARE MOUTH RINSE
15.0000 mL | Freq: Two times a day (BID) | OROMUCOSAL | Status: DC
  Administered 2018-09-22 – 2018-09-28 (×10): 15 mL via OROMUCOSAL

## 2018-09-21 MED ORDER — SPIRONOLACTONE 25 MG PO TABS
25.0000 mg | ORAL_TABLET | Freq: Every day | ORAL | Status: DC
  Administered 2018-09-24 – 2018-09-25 (×2): 25 mg via ORAL
  Filled 2018-09-21 (×2): qty 1

## 2018-09-21 MED ORDER — SODIUM CHLORIDE 0.9 % IV SOLN: 250.0000 mL | INTRAVENOUS | Status: DC | PRN

## 2018-09-21 MED ORDER — SODIUM CHLORIDE 0.9% FLUSH
3.0000 mL | Freq: Two times a day (BID) | INTRAVENOUS | Status: DC
  Administered 2018-09-21 – 2018-09-29 (×10): 3 mL via INTRAVENOUS

## 2018-09-21 MED ORDER — PANTOPRAZOLE SODIUM 40 MG PO TBEC
40.0000 mg | DELAYED_RELEASE_TABLET | Freq: Every day | ORAL | Status: DC
  Administered 2018-09-24 – 2018-09-27 (×4): 40 mg via ORAL
  Filled 2018-09-21 (×4): qty 1

## 2018-09-21 MED ORDER — INSULIN ASPART 100 UNIT/ML ~~LOC~~ SOLN: 0.0000 [IU] | Freq: Every day | SUBCUTANEOUS | Status: DC

## 2018-09-21 MED ORDER — DULOXETINE HCL 30 MG PO CPEP
30.0000 mg | ORAL_CAPSULE | Freq: Every day | ORAL | Status: DC
  Administered 2018-09-24 – 2018-09-29 (×6): 30 mg via ORAL
  Filled 2018-09-21 (×7): qty 1

## 2018-09-21 MED ORDER — FUROSEMIDE 40 MG PO TABS
40.0000 mg | ORAL_TABLET | Freq: Every day | ORAL | Status: DC
  Administered 2018-09-24 – 2018-09-25 (×2): 40 mg via ORAL
  Filled 2018-09-21 (×3): qty 1

## 2018-09-21 MED ORDER — LACTULOSE ENEMA
300.0000 mL | Freq: Once | ORAL | Status: AC
  Administered 2018-09-21: 300 mL via RECTAL
  Filled 2018-09-21: qty 300

## 2018-09-21 MED ORDER — RIFAXIMIN 550 MG PO TABS
550.0000 mg | ORAL_TABLET | Freq: Two times a day (BID) | ORAL | Status: DC
  Administered 2018-09-24 – 2018-09-29 (×9): 550 mg via ORAL
  Filled 2018-09-21 (×12): qty 1

## 2018-09-21 MED ORDER — LACTATED RINGERS IV BOLUS
1000.0000 mL | Freq: Once | INTRAVENOUS | Status: AC
  Administered 2018-09-21: 1000 mL via INTRAVENOUS

## 2018-09-21 NOTE — ED Provider Notes (Signed)
Emergency Department Provider Note   I have reviewed the triage vital signs and the nursing notes.   HISTORY  Chief Complaint Altered Mental Status   HPI Denise Cuevas is a 67 y.o. female whose only history is provided by EMS who obtained from the husband.  His son that the patient was not talking or acting herself this morning which is consistent with her normal hepatic encephalopathy state and was sent here for further evaluation.  Patient only answers yes to everything. No other associated or modifying symptoms.   Level V Caveat Secondary to Altered Mental Status  Past Medical History:  Diagnosis Date  . Angina   . Asthma   . CHF (congestive heart failure) ([place])   . 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 COPD (chronic obstructive pulmonary disease) ([place])   . Coronary artery disease    s/p CABG  . Depression   . Diabetes mellitus    type 2  . Dyspnea   . Edema extremities    consistent edema lower legs, feet, and right quadrant abdominal pain-"goes and comes"  . Family history of adverse reaction to anesthesia    SISTER HAS NAUSEA  . Fibromyalgia   . GERD (gastroesophageal reflux disease)   . H/O hiatal hernia   . Headache(784.0)    occ. generalized  . Heart murmur   . Hypercholesteremia   . Hypertension   . Hypothyroid   . Myocardial infarction [facility]) 11/2011   MI x2- 7'79,3'90 coronary stent(chest pain episode at time)  . PONV (postoperative nausea and vomiting)     Patient Active Problem List   Diagnosis Date Noted  . Altered mental status 09/21/2018  . Anemia 07/09/2018  . Thrombocytopenia ([place]) 07/09/2018  . HCAP (healthcare-associated pneumonia) 07/09/2018  . AKI (acute kidney injury) ([place]) 07/09/2018  . Lactic acidosis 07/09/2018  . Acute hepatic encephalopathy 12/17/2017  . Atrial fibrillation ([place]) [I48.91] 11/28/2017  . Hospital discharge follow-up 10/30/2017  . Nonrheumatic aortic valve stenosis   . Chronic bronchitis ([place])     . Other chest pain 10/17/2017  . Peripheral edema   . Unstable angina ([place]) 10/15/2017  . Asthma, cough variant   . Cirrhosis of liver not due to alcohol ([place]) 11/20/2016  . Pancytopenia ([place]) 11/20/2016  . Protein-calorie malnutrition, severe ([place]) 11/20/2016  . Acute diastolic CHF (congestive heart failure) ([place])   . Congestive heart failure ([place]) 11/19/2016  . Chest pain 05/29/2015  . Memory loss 05/29/2015  . Essential hypertension 03/20/2015  . Dyspnea 02/02/2015  . Acute respiratory distress 02/02/2015  . Depression   . GERD (gastroesophageal reflux disease)   . Unstable angina pectoris ([place]) 11/25/2011  . Hypothyroidism   . CAD (coronary artery disease)   . Other and unspecified angina pectoris 11/19/2011  . Type 2 diabetes mellitus, uncontrolled ([place]) 11/19/2011  . Myocardial infarction ([place]) 11/09/2011    Past Surgical History:  Procedure Laterality Date  . AORTIC VALVE REPLACEMENT N/A 11/12/2017   Procedure: AORTIC VALVE REPLACEMENT (AVR)  USING 19 MM MAGNA EASE PERICARDIAL BIOPROSTHESIS-AORTIC, MODEL 3300TFX;  Surgeon: [name], MD;  Location: [place];  Service: Open Heart Surgery;  Laterality: N/A;  . ARTERIAL LINE INSERTION Right 11/12/2017   Procedure: ARTERIAL LINE INSERTION;  Surgeon: [name], MD;  Location: [place];  Service: Open Heart Surgery;  Laterality: Right;  placed in right femoral artery.  . CHOLECYSTECTOMY     open many yrs ago  . COLONOSCOPY WITH PROPOFOL N/A 08/24/2014  Procedure: COLONOSCOPY WITH PROPOFOL;  Surgeon: Arta Silence, MD;  Location: [facility];  Service: Endoscopy;  Laterality: N/A;  . CORONARY ANGIOPLASTY WITH STENT PLACEMENT  05/06/2012  . CORONARY ARTERY BYPASS GRAFT  11/27/2011   Procedure: CORONARY ARTERY BYPASS GRAFTING (CABG);  Surgeon: [name], MD;  Location: [place];  Service: Open Heart Surgery;  Laterality: N/A;  coronary artery bypass graft on pump times two utilizing left internal mammary artery and  right saphenous vein harvested endoscopically   . ESOPHAGOGASTRODUODENOSCOPY (EGD) WITH PROPOFOL N/A 08/24/2014   Procedure: ESOPHAGOGASTRODUODENOSCOPY (EGD) WITH PROPOFOL;  Surgeon: Arta Silence, MD;  Location: [facility];  Service: Endoscopy;  Laterality: N/A;  . ESOPHAGOGASTRODUODENOSCOPY (EGD) WITH PROPOFOL N/A 02/16/2018   Procedure: ESOPHAGOGASTRODUODENOSCOPY (EGD) WITH PROPOFOL;  Surgeon: Otis Brace, MD;  Location: [facility];  Service: Gastroenterology;  Laterality: N/A;  . KNEE SURGERY Right    scope surgery  . LEFT HEART CATHETERIZATION WITH CORONARY ANGIOGRAM N/A 11/19/2011   Procedure: LEFT HEART CATHETERIZATION WITH CORONARY ANGIOGRAM;  Surgeon: Candee Furbish, MD;  Location: [facility] CATH LAB;  Service: Cardiovascular;  Laterality: N/A;  . LEFT HEART CATHETERIZATION WITH CORONARY ANGIOGRAM  05/06/2012   Procedure: LEFT HEART CATHETERIZATION WITH CORONARY ANGIOGRAM;  Surgeon: Jettie Booze, MD;  Location: [facility] CATH LAB;  Service: Cardiovascular;;  . LEFT HEART CATHETERIZATION WITH CORONARY/GRAFT ANGIOGRAM N/A 03/24/2012   Procedure: LEFT HEART CATHETERIZATION WITH Beatrix Fetters;  Surgeon: Jettie Booze, MD;  Location: [facility] CATH LAB;  Service: Cardiovascular;  Laterality: N/A;  . MITRAL VALVE REPLACEMENT N/A 11/12/2017   Procedure: MITRAL VALVE (MV) REPLACEMENT USING 25 MM MAGNA MITRAL EASE PERICARDIAL BIOPROSTHESIS, MODEL 7300TFX;  Surgeon: [name], MD;  Location: [place];  Service: Open Heart Surgery;  Laterality: N/A;  Using 55mm Magna Ease Mitral Pericardial Bioprosthesis  . PERCUTANEOUS CORONARY STENT INTERVENTION (PCI-S)  03/24/2012   Procedure: PERCUTANEOUS CORONARY STENT INTERVENTION (PCI-S);  Surgeon: Jettie Booze, MD;  Location: [facility] CATH LAB;  Service: Cardiovascular;;  . PERCUTANEOUS CORONARY STENT INTERVENTION (PCI-S) Bilateral 05/06/2012   Procedure: PERCUTANEOUS CORONARY STENT INTERVENTION (PCI-S);  Surgeon: Jettie Booze, MD;   Location: [facility];  Service: Cardiovascular;  Laterality: Bilateral;  . RIGHT/LEFT HEART CATH AND CORONARY/GRAFT ANGIOGRAPHY N/A 10/20/2017   Procedure: RIGHT/LEFT HEART CATH AND CORONARY/GRAFT ANGIOGRAPHY;  Surgeon: Burnell Blanks, MD;  Location: [place] CV LAB;  Service: Cardiovascular;  Laterality: N/A;  . TEE WITHOUT CARDIOVERSION N/A 10/20/2017   Procedure: TRANSESOPHAGEAL ECHOCARDIOGRAM (TEE);  Surgeon: Dorothy Spark, MD;  Location: [facility] ENDOSCOPY;  Service: Cardiovascular;  Laterality: N/A;  . TEE WITHOUT CARDIOVERSION N/A 11/12/2017   Procedure: TRANSESOPHAGEAL ECHOCARDIOGRAM (TEE);  Surgeon: [name], MD;  Location: [place];  Service: Open Heart Surgery;  Laterality: N/A;    Current Outpatient Rx  . Order #: [id number] Class: Print  . Order #: [id number] Class: Print  . Order #: [id number] Class: Historical Med  . Order #: [id number] Class: Historical Med  . Order #: [id number] Class: Historical Med  . Order #: [id number] Class: Normal  . Order #: [id number] Class: Historical Med  . Order #: [id number] Class: Historical Med  . Order #: [id number] Class: Normal  . Order #: [id number] Class: Historical Med  . Order #: [id number] Class: Historical Med  . Order #: [id number] Class: Historical Med  . Order #: [id number] Class: Normal  . Order #: [id number] Class: Historical Med    Allergies Exenatide and Ace inhibitors  Family History  Problem Relation Age of Onset  . Heart disease Father 46       died  of MI  . Emphysema Father        smoked  . Peripheral vascular disease Mother 16       bilaterial amputations  . Breast cancer Neg Hx     Social History Social History   Tobacco Use  . Smoking status: Never Smoker  . Smokeless tobacco: Never Used  Substance Use Topics  . Alcohol use: No  . Drug use: No    Review of Systems  Level V Caveat Secondary to Altered Mental Status ____________________________________________   PHYSICAL EXAM:  VITAL SIGNS: ED Triage  Vitals  Enc Vitals Group     BP 09/21/18 1152 (!) 130/53     Pulse Rate 09/21/18 1152 94     Resp 09/21/18 1156 10     Temp 09/21/18 1156 98 F (36.7 C)     Temp Source 09/21/18 1156 Oral     SpO2 09/21/18 1152 96 %     Weight 09/21/18 1157 182 lb 15.7 oz (83 kg)     Height 09/21/18 1157 5' (1.524 m)    Constitutional: Alert and opens eyes to voice. Only answers yes to all questions even if inapproriate.  Eyes: Conjunctivae are icteric. PERRL. EOMI. Head: Atraumatic. Nose: No congestion/rhinnorhea. Mouth/Throat: Mucous membranes are dry.  Oropharynx non-erythematous. Neck: No stridor.  No meningeal signs.   Cardiovascular: Normal rate, regular rhythm. Good peripheral circulation. Grossly normal heart sounds.   Respiratory: Normal respiratory effort.  No retractions. Lungs with wheezing L>R but also upper airway congestion. Gastrointestinal: Soft and nontender. Significant distention.  Musculoskeletal: No lower extremity tenderness but has BLE edema. No gross deformities of extremities. Neurologic:  Not able to fully assess.  Skin:  Skin is warm, dry and intact. Petechiale/purpial rash rash noted on BUE. Jaundice noted.   ____________________________________________   LABS (all labs ordered are listed, but only abnormal results are displayed)  Labs Reviewed  COMPREHENSIVE METABOLIC PANEL - Abnormal; Notable for the following components:      Result Value   Sodium 133 (*)    Chloride 93 (*)    Glucose, Bld 207 (*)    Creatinine, Ser 1.53 (*)    Calcium 8.6 (*)    Total Protein 5.9 (*)    Albumin 2.8 (*)    AST 48 (*)    Alkaline Phosphatase 144 (*)    Total Bilirubin 6.0 (*)    GFR calc non Af Amer 35 (*)    GFR calc Af Amer 40 (*)    All other components within normal limits  CBC - Abnormal; Notable for the following components:   RBC 3.56 (*)    Hemoglobin 11.5 (*)    HCT 34.6 (*)    RDW 16.9 (*)    Platelets 98 (*)    All other components within normal limits    PROTIME-INR - Abnormal; Notable for the following components:   Prothrombin Time 16.8 (*)    All other components within normal limits  AMMONIA - Abnormal; Notable for the following components:   Ammonia 51 (*)    All other components within normal limits  LACTIC ACID, PLASMA - Abnormal; Notable for the following components:   Lactic Acid, Venous 2.8 (*)    All other components within normal limits  LACTIC ACID, PLASMA - Abnormal; Notable for the following components:   Lactic Acid, Venous 2.9 (*)    All other components within normal limits  URINALYSIS, ROUTINE W REFLEX MICROSCOPIC - Abnormal; Notable for the following components:  Color, Urine AMBER (*)    APPearance HAZY (*)    Glucose, UA 50 (*)    All other components within normal limits  CBG MONITORING, ED - Abnormal; Notable for the following components:   Glucose-Capillary 182 (*)    All other components within normal limits  TSH   ____________________________________________  EKG   EKG Interpretation  Date/Time:  Monday September 21 2018 12:00:44 EDT Ventricular Rate:  93 PR Interval:    QRS Duration: 91 QT Interval:  411 QTC Calculation: 512 R Axis:   -27 Text Interpretation:  Sinus rhythm Inferior infarct, old Prolonged QT interval No significant change since last tracing Confirmed by Merrily Pew [phone]) on 09/21/2018 12:50:55 PM       ____________________________________________  RADIOLOGY  Dg Chest 1 View  Result Date: 09/21/2018 CLINICAL DATA:  Altered mental status, cough and congestion, history coronary artery disease post MI, cirrhosis, COPD, type II diabetes mellitus, hypertension EXAM: CHEST  1 VIEW COMPARISON:  Portable exam 1243 hours compared to 07/08/2018 FINDINGS: Normal heart size post median sternotomy, MVR and AVR. Mediastinal contours and pulmonary vascularity normal. Low lung volumes with mild RIGHT basilar atelectasis. No definite infiltrate, pleural effusion or pneumothorax. Bones  demineralized. IMPRESSION: Low lung volumes with RIGHT basilar atelectasis. Electronically Signed   By: Lavonia Dana M.D.   On: 09/21/2018 13:04   Ct Head Wo Contrast  Result Date: 09/21/2018 CLINICAL DATA:  Altered level of consciousness. EXAM: CT HEAD WITHOUT CONTRAST TECHNIQUE: Contiguous axial images were obtained from the base of the skull through the vertex without intravenous contrast. COMPARISON:  CT scan of December 17, 2017. FINDINGS: Brain: No evidence of acute infarction, hemorrhage, hydrocephalus, extra-axial collection or mass lesion/mass effect. Vascular: No hyperdense vessel or unexpected calcification. Skull: Normal. Negative for fracture or focal lesion. Sinuses/Orbits: No acute finding. Other: None. IMPRESSION: Normal head CT. Electronically Signed   By: Marijo Conception, M.D.   On: 09/21/2018 14:11    ____________________________________________   PROCEDURES  Procedure(s) performed:   Procedures   ____________________________________________   INITIAL IMPRESSION / ASSESSMENT AND PLAN / ED COURSE  Patient with jaundice and icterus abnormal breath sounds but also with distended abdomen and lower extremity edema.  Could be infectious versus metabolic.  Will check labs.  Will wait for her husband and discuss with him as well.  On husband's arrival the patient is still as she was previously.  He states that she is sniffily more weak and altered than baseline.  States sometimes she will go through these episodes but today is generally worse than previous ones.  Her labs are relatively unremarkable given her condition and her baseline.  Unclear etiology at this point she does seem to be dehydrated with some fluids given.  Could be related to the ascites however seems to be unlikely.  Her ammonia was only 58 when she is normally symptomatic she is in the 100s and 200s.  Unclear etiology for altered mental status and generalized weakness at this time aside from her chronic illness but  has had an acute change in the last 12 or so hours and is not safe to go home with her husband who is also elderly and not able to take care of her by himself.  Will admit to hospital.  Pertinent labs & imaging results that were available during my care of the patient were reviewed by me and considered in my medical decision making (see chart for details).  ____________________________________________  FINAL CLINICAL IMPRESSION(S) / ED  DIAGNOSES  Final diagnoses:  Altered mental status, unspecified altered mental status type     MEDICATIONS GIVEN DURING THIS VISIT:  Medications  lactulose (CHRONULAC) enema 200 gm (has no administration in time range)  sodium chloride flush (NS) 0.9 % injection 3 mL (has no administration in time range)  sodium chloride flush (NS) 0.9 % injection 3 mL (has no administration in time range)  0.9 %  sodium chloride infusion (has no administration in time range)  feeding supplement (PRO-STAT SUGAR FREE 64) liquid 30 mL (has no administration in time range)  lactated ringers bolus 1,000 mL ( Intravenous Stopped 09/21/18 1416)     NEW OUTPATIENT MEDICATIONS STARTED DURING THIS VISIT:  New Prescriptions   No medications on file    Note:  This note was prepared with assistance of Dragon voice recognition software. Occasional wrong-word or sound-a-like substitutions may have occurred due to the inherent limitations of voice recognition software.   Merrily Pew, MD 09/21/18 1616

## 2018-09-21 NOTE — ED Notes (Signed)
CRITICAL VALUE ALERT  Critical Value:  Lactic Acid 2.9  Date & Time Notied:  09/21/18 @ 1535  Provider Notified: Dr Dayna Barker  Orders Received/Actions taken: see new orders.

## 2018-09-21 NOTE — ED Notes (Signed)
Pt in radiology at this time. 

## 2018-09-21 NOTE — ED Notes (Signed)
Date and time results received: 09/21/18 1243 (use smartphrase ".now" to insert current time)  Test: lac acid Critical Value: 2.8  Name of Provider Notified: [facility]  Orders Received? Or Actions Taken?: see emar

## 2018-09-21 NOTE — ED Triage Notes (Signed)
Pt presents for altered mental status for unknown time.  Husband called ems stating pt walked to the bathroom this morning but didn't talk to him.  States this is usual presentation with elevated ammonia levels.

## 2018-09-21 NOTE — H&P (Addendum)
TRH H&P   Patient Demographics:    Denise Cuevas, is a 67 y.o. female  MRN: [id number]   DOB - July 05, 1951  Admit Date - 09/21/2018  Outpatient Primary MD for the patient is Via, Lennette Bihari, MD  Referring MD/NP/PA: Dorise Bullion  Outpatient Specialists:     [facility]  Patient coming from: home  Chief Complaint  Patient presents with  . Altered Mental Status      HPI:    Denise Cuevas  is a 67 y.o. female, w hypertension, hyperlipidemia, Dm2 hypothyroidism, CAD s/p CABG, CHF, (EF ), Hepatic cirrhosis, apparently presents with altered mental status and sent by home health nurse to ER.   In ED,  T 98 P 92  Bp 127/65  Pox 98% on RA  CT brain IMPRESSION: Normal head CT.  CXR IMPRESSION: Low lung volumes with RIGHT basilar atelectasis.  Urinalysis negative Na 133, K 3.7, Bun 21, Creatinine 1.53 Alb 2.8 Glucose 207 Ast 48, Alt 23, Alk phos 144, T. Bili 6.0  Wbc 9.7, Hgb 11.5, Plt 98  INR 1.37  Ammonia 51  Lactic acid 2.8  Pt will be admitted for altered mental status secondary to hepatic encephalopathy.          Review of systems:    In addition to the HPI above, No Fever-chills, No Headache, No changes with Vision or hearing, No problems swallowing food or Liquids, No Chest pain, Cough or Shortness of Breath, No Abdominal pain, No Nausea or Vommitting, Bowel movements are regular, No Blood in stool or Urine, No dysuria, No new skin rashes or bruises, No new joints pains-aches,  No new weakness, tingling, numbness in any extremity, No recent weight gain or loss, No polyuria, polydypsia or polyphagia, No significant Mental Stressors.  A full 10 point Review of Systems was done, except as stated above, all other Review of Systems were negative.   With Past History of the following :    Past Medical History:  Diagnosis Date  . Angina   . Asthma     . CHF (congestive heart failure) ([place])   . Cirrhosis of liver ([place]) 08/2014   idiopathic  . COPD (chronic obstructive pulmonary disease) ([place])   . Coronary artery disease    s/p CABG  . Depression   . Diabetes mellitus    type 2  . Dyspnea   . Edema extremities    consistent edema lower legs, feet, and right quadrant abdominal pain-"goes and comes"  . Family history of adverse reaction to anesthesia    SISTER HAS NAUSEA  . Fibromyalgia   . GERD (gastroesophageal reflux disease)   . H/O hiatal hernia   . Headache(784.0)    occ. generalized  . Heart murmur   . Hypercholesteremia   . Hypertension   . Hypothyroid   . Myocardial infarction [facility]) 11/2011   MI x2- 6'83,4'19 coronary stent(chest pain  episode at time)  . PONV (postoperative nausea and vomiting)       Past Surgical History:  Procedure Laterality Date  . AORTIC VALVE REPLACEMENT N/A 11/12/2017   Procedure: AORTIC VALVE REPLACEMENT (AVR)  USING 19 MM MAGNA EASE PERICARDIAL BIOPROSTHESIS-AORTIC, MODEL 3300TFX;  Surgeon: [name], MD;  Location: [place];  Service: Open Heart Surgery;  Laterality: N/A;  . ARTERIAL LINE INSERTION Right 11/12/2017   Procedure: ARTERIAL LINE INSERTION;  Surgeon: [name], MD;  Location: [place];  Service: Open Heart Surgery;  Laterality: Right;  placed in right femoral artery.  . CHOLECYSTECTOMY     open many yrs ago  . COLONOSCOPY WITH PROPOFOL N/A 08/24/2014   Procedure: COLONOSCOPY WITH PROPOFOL;  Surgeon: Arta Silence, MD;  Location: [facility];  Service: Endoscopy;  Laterality: N/A;  . CORONARY ANGIOPLASTY WITH STENT PLACEMENT  05/06/2012  . CORONARY ARTERY BYPASS GRAFT  11/27/2011   Procedure: CORONARY ARTERY BYPASS GRAFTING (CABG);  Surgeon: [name], MD;  Location: [place];  Service: Open Heart Surgery;  Laterality: N/A;  coronary artery bypass graft on pump times two utilizing left internal mammary artery and right saphenous vein harvested endoscopically   .  ESOPHAGOGASTRODUODENOSCOPY (EGD) WITH PROPOFOL N/A 08/24/2014   Procedure: ESOPHAGOGASTRODUODENOSCOPY (EGD) WITH PROPOFOL;  Surgeon: Arta Silence, MD;  Location: [facility];  Service: Endoscopy;  Laterality: N/A;  . ESOPHAGOGASTRODUODENOSCOPY (EGD) WITH PROPOFOL N/A 02/16/2018   Procedure: ESOPHAGOGASTRODUODENOSCOPY (EGD) WITH PROPOFOL;  Surgeon: Otis Brace, MD;  Location: [facility];  Service: Gastroenterology;  Laterality: N/A;  . KNEE SURGERY Right    scope surgery  . LEFT HEART CATHETERIZATION WITH CORONARY ANGIOGRAM N/A 11/19/2011   Procedure: LEFT HEART CATHETERIZATION WITH CORONARY ANGIOGRAM;  Surgeon: Candee Furbish, MD;  Location: [facility] CATH LAB;  Service: Cardiovascular;  Laterality: N/A;  . LEFT HEART CATHETERIZATION WITH CORONARY ANGIOGRAM  05/06/2012   Procedure: LEFT HEART CATHETERIZATION WITH CORONARY ANGIOGRAM;  Surgeon: Jettie Booze, MD;  Location: [facility] CATH LAB;  Service: Cardiovascular;;  . LEFT HEART CATHETERIZATION WITH CORONARY/GRAFT ANGIOGRAM N/A 03/24/2012   Procedure: LEFT HEART CATHETERIZATION WITH Beatrix Fetters;  Surgeon: Jettie Booze, MD;  Location: [facility] CATH LAB;  Service: Cardiovascular;  Laterality: N/A;  . MITRAL VALVE REPLACEMENT N/A 11/12/2017   Procedure: MITRAL VALVE (MV) REPLACEMENT USING 25 MM MAGNA MITRAL EASE PERICARDIAL BIOPROSTHESIS, MODEL 7300TFX;  Surgeon: [name], MD;  Location: [place];  Service: Open Heart Surgery;  Laterality: N/A;  Using 60m Magna Ease Mitral Pericardial Bioprosthesis  . PERCUTANEOUS CORONARY STENT INTERVENTION (PCI-S)  03/24/2012   Procedure: PERCUTANEOUS CORONARY STENT INTERVENTION (PCI-S);  Surgeon: JJettie Booze MD;  [facility];  Service: Cardiovascular;;  . PERCUTANEOUS CORONARY STENT INTERVENTION (PCI-S) Bilateral 05/06/2012   Procedure: PERCUTANEOUS CORONARY STENT INTERVENTION (PCI-S);  Surgeon: JJettie Booze MD;  [facility];  Service: Cardiovascular;   Laterality: Bilateral;  . RIGHT/LEFT HEART CATH AND CORONARY/GRAFT ANGIOGRAPHY N/A 10/20/2017   Procedure: RIGHT/LEFT HEART CATH AND CORONARY/GRAFT ANGIOGRAPHY;  Surgeon: MBurnell Blanks MD;  [facility];  Service: Cardiovascular;  Laterality: N/A;  . TEE WITHOUT CARDIOVERSION N/A 10/20/2017   Procedure: TRANSESOPHAGEAL ECHOCARDIOGRAM (TEE);  Surgeon: NDorothy Spark MD;  [facility];  Service: Cardiovascular;  Laterality: N/A;  . TEE WITHOUT CARDIOVERSION N/A 11/12/2017   Procedure: TRANSESOPHAGEAL ECHOCARDIOGRAM (TEE);  Surgeon: GGrace Isaac MD;  [place]  Service: Open Heart Surgery;  Laterality: N/A;      Social History:  Social History   Tobacco Use  . Smoking status: Never Smoker  . Smokeless tobacco: Never Used  Substance Use Topics  . Alcohol use: No     Lives - at home with husband  Mobility - walks by self   Family History :     Family History  Problem Relation Age of Onset  . Heart disease Father 103       died of MI  . Emphysema Father        smoked  . Peripheral vascular disease Mother 47       bilaterial amputations  . Breast cancer Neg Hx     Home Medications:   Prior to Admission medications   Medication Sig Start Date End Date Taking? Authorizing Provider  albuterol (PROVENTIL HFA;VENTOLIN HFA) 108 (90 BASE) MCG/ACT inhaler Inhale 2 puffs into the lungs every 6 (six) hours as needed for wheezing or shortness of breath. 02/04/15   Ghimire, Henreitta Leber, MD  albuterol (PROVENTIL) (2.5 MG/3ML) 0.083% nebulizer solution Take 3 mLs (2.5 mg total) by nebulization every 2 (two) hours as needed for wheezing. 02/04/15   Ghimire, Henreitta Leber, MD  DULoxetine (CYMBALTA) 30 MG capsule Take 30 mg by mouth daily with lunch. In conjunction with one 60 mg capsule to equal a total of 90 milligrams    [provider]  DULoxetine (CYMBALTA) 60 MG capsule Take 60 mg by mouth daily with lunch. In conjunction with one  30 mg capsule to equal a total of 90 milligrams    [provider]  furosemide (LASIX) 40 MG tablet Take 40 mg by mouth daily.    [provider]  HUMALOG MIX 75/25 KWIKPEN (75-25) 100 UNIT/ML Kwikpen Inject 25 Units into the skin 2 (two) times daily. 02/20/18   Nita Sells, MD  KRISTALOSE 20 g packet Take 20 g by mouth 3 (three) times daily. 01/12/18   [provider]  levofloxacin (LEVAQUIN) 750 MG tablet Take 1 tablet by mouth daily. 07/02/18   [provider]  levothyroxine (SYNTHROID, LEVOTHROID) 50 MCG tablet TAKE 1 TABLET(50 MCG) BY MOUTH DAILY BEFORE BREAKFAST 03/06/18   Jerline Pain, MD  methocarbamol (ROBAXIN) 750 MG tablet Take 1 tablet by mouth as needed. 07/02/18   [provider]  mometasone-formoterol (DULERA) 100-5 MCG/ACT AERO Inhale 2 puffs into the lungs 2 (two) times daily as needed for wheezing or shortness of breath.    [provider]  omeprazole-sodium bicarbonate (ZEGERID) 40-1100 MG per capsule Take 1 capsule by mouth daily before breakfast.    [provider]  spironolactone (ALDACTONE) 25 MG tablet Take 1 tablet (25 mg total) by mouth daily. 11/23/16   Hongalgi, Lenis Dickinson, MD  XIFAXAN 550 MG TABS tablet Take 550 mg by mouth 2 (two) times daily. 01/20/18   [provider]     Allergies:     Allergies  Allergen Reactions  . Exenatide Nausea And Vomiting  . Ace Inhibitors Other (See Comments)    Pseudoasthma. Pharmacy asked about this and patient does not recall so not sure if true allergy     Physical Exam:   Vitals  Blood pressure 127/65, pulse 92, temperature 98 F (36.7 C), temperature source Oral, resp. rate 16, height 5' (1.524 m), weight 83 kg, SpO2 98 %.   1. General  lying in bed somnolent  2. Somnolent, opens eye to command, axox2  3. No F.N deficits, ALL C.Nerves Intact, Strength 5/5 all 4 extremities, Sensation intact all 4 extremities,  Plantars down going.  4. Ears and  Eyes appear Normal, Conjunctivae yellow, PERRLA. Moist Oral Mucosa.  5. Supple Neck, No JVD, No cervical lymphadenopathy appriciated, No Carotid Bruits.  6. Symmetrical Chest wall movement, Good air movement bilaterally, CTAB.  7. RRR, No Gallops, Rubs or Murmurs, No Parasternal Heave.  8. Positive Bowel Sounds, Abdomen Soft, Distended,  No tenderness, No organomegaly appriciated,No rebound -guarding or rigidity.  9.  No Cyanosis, Normal Skin Turgor, No Skin Rash or Bruise.  10. Good muscle tone,  joints appear normal , no effusions, Normal ROM.  11. No Palpable Lymph Nodes in Neck or Axillae     Data Review:    CBC Recent Labs  Lab 09/21/18 1211  WBC 9.7  HGB 11.5*  HCT 34.6*  PLT 98*  MCV 97.2  MCH 32.3  MCHC 33.2  RDW 16.9*   ------------------------------------------------------------------------------------------------------------------  Chemistries  Recent Labs  Lab 09/21/18 1211  NA 133*  K 3.7  CL 93*  CO2 26  GLUCOSE 207*  BUN 21  CREATININE 1.53*  CALCIUM 8.6*  AST 48*  ALT 23  ALKPHOS 144*  BILITOT 6.0*   ------------------------------------------------------------------------------------------------------------------ estimated creatinine clearance is 35.5 mL/min (A) (by C-G formula based on SCr of 1.53 mg/dL (H)). ------------------------------------------------------------------------------------------------------------------ No results for input(s): TSH, T4TOTAL, T3FREE, THYROIDAB in the last 72 hours.  Invalid input(s): FREET3  Coagulation profile Recent Labs  Lab 09/21/18 1211  INR 1.37   ------------------------------------------------------------------------------------------------------------------- No results for input(s): DDIMER in the last 72 hours. -------------------------------------------------------------------------------------------------------------------  Cardiac Enzymes No results for input(s): CKMB, TROPONINI,  MYOGLOBIN in the last 168 hours.  Invalid input(s): CK ------------------------------------------------------------------------------------------------------------------    Component Value Date/Time   BNP 146.1 (H) 10/15/2017 1843     ---------------------------------------------------------------------------------------------------------------  Urinalysis    Component Value Date/Time   COLORURINE AMBER (A) 09/21/2018 1200   APPEARANCEUR HAZY (A) 09/21/2018 1200   LABSPEC 1.018 09/21/2018 1200   PHURINE 5.0 09/21/2018 1200   GLUCOSEU 50 (A) 09/21/2018 1200   HGBUR NEGATIVE 09/21/2018 1200   BILIRUBINUR NEGATIVE 09/21/2018 1200   KETONESUR NEGATIVE 09/21/2018 1200   PROTEINUR NEGATIVE 09/21/2018 1200   UROBILINOGEN 1.0 02/02/2015 1539   NITRITE NEGATIVE 09/21/2018 1200   LEUKOCYTESUR NEGATIVE 09/21/2018 1200    ----------------------------------------------------------------------------------------------------------------   Imaging Results:    Dg Chest 1 View  Result Date: 09/21/2018 CLINICAL DATA:  Altered mental status, cough and congestion, history coronary artery disease post MI, cirrhosis, COPD, type II diabetes mellitus, hypertension EXAM: CHEST  1 VIEW COMPARISON:  Portable exam 1243 hours compared to 07/08/2018 FINDINGS: Normal heart size post median sternotomy, MVR and AVR. Mediastinal contours and pulmonary vascularity normal. Low lung volumes with mild RIGHT basilar atelectasis. No definite infiltrate, pleural effusion or pneumothorax. Bones demineralized. IMPRESSION: Low lung volumes with RIGHT basilar atelectasis. Electronically Signed   By: Lavonia Dana M.D.   On: 09/21/2018 13:04   Ct Head Wo Contrast  Result Date: 09/21/2018 CLINICAL DATA:  Altered level of consciousness. EXAM: CT HEAD WITHOUT CONTRAST TECHNIQUE: Contiguous axial images were obtained from the base of the skull through the vertex without intravenous contrast. COMPARISON:  CT scan of December 17, 2017. FINDINGS: Brain: No evidence of acute infarction, hemorrhage, hydrocephalus, extra-axial collection or mass lesion/mass effect. Vascular: No hyperdense vessel or unexpected calcification. Skull: Normal. Negative for fracture or focal lesion. Sinuses/Orbits: No acute finding. Other: None. IMPRESSION: Normal head CT. Electronically Signed   By: Marijo Conception, M.D.   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 Check cbc in am  Renal insufficiency Check cmp in am  Cirrhosis uncertain etiology Cont Spironolactone 51m po qday Cont Lasix 468mpoq day  Gerd Cont PPI  Dm2 fsbs ac and qhs, ISS  Hypothyroidism Cont Levothyroxine  Fibromyalgia Cont Cymbalta  Copd not on home o2 Cont albuterol neb q6h prn   Deconditioning PT/OT to evaluate and tx  DVT Prophylaxis    SCDs   AM Labs Ordered, also please review Full Orders  Family Communication: Admission, patients condition and plan of care including tests being ordered have been discussed with the patient  who indicate understanding and agree with the plan and Code Status.  Code Status  FULL CODE  Likely DC to  home  Condition GUARDED    Consults called:  none  Admission status: observation, pt requires admission due to need for rectal lactuslose due to altered mental status likely due to hepatic encephalopathy, if not improving w lactulose  may require inpatient status , per husband took 8 days to recover on a prior admission.   Time spent in minutes : 70   JaJani Gravel.D on  09/21/2018 at 3:46 PM  Between 7am to 7pm - Pager - [phone]. After 7pm go to [email or web address] - password [email or web address] - [facility]eTri[facility] - Office  [phone]

## 2018-09-21 NOTE — ED Notes (Signed)
Lab at bedside obtaining second lactic acid.

## 2018-09-22 ENCOUNTER — Encounter (HOSPITAL_COMMUNITY): Payer: Self-pay | Admitting: Family Medicine

## 2018-09-22 DIAGNOSIS — K7581 Nonalcoholic steatohepatitis (NASH): Secondary | ICD-10-CM | POA: Diagnosis present

## 2018-09-22 DIAGNOSIS — Z952 Presence of prosthetic heart valve: Secondary | ICD-10-CM | POA: Diagnosis not present

## 2018-09-22 DIAGNOSIS — J449 Chronic obstructive pulmonary disease, unspecified: Secondary | ICD-10-CM | POA: Diagnosis present

## 2018-09-22 DIAGNOSIS — E039 Hypothyroidism, unspecified: Secondary | ICD-10-CM | POA: Diagnosis present

## 2018-09-22 DIAGNOSIS — R338 Other retention of urine: Secondary | ICD-10-CM

## 2018-09-22 DIAGNOSIS — Z515 Encounter for palliative care: Secondary | ICD-10-CM | POA: Diagnosis not present

## 2018-09-22 DIAGNOSIS — N179 Acute kidney failure, unspecified: Secondary | ICD-10-CM | POA: Diagnosis present

## 2018-09-22 DIAGNOSIS — D696 Thrombocytopenia, unspecified: Secondary | ICD-10-CM | POA: Diagnosis present

## 2018-09-22 DIAGNOSIS — R188 Other ascites: Secondary | ICD-10-CM | POA: Diagnosis not present

## 2018-09-22 DIAGNOSIS — R400 Somnolence: Secondary | ICD-10-CM | POA: Diagnosis not present

## 2018-09-22 DIAGNOSIS — R339 Retention of urine, unspecified: Secondary | ICD-10-CM | POA: Diagnosis present

## 2018-09-22 DIAGNOSIS — E43 Unspecified severe protein-calorie malnutrition: Secondary | ICD-10-CM | POA: Diagnosis present

## 2018-09-22 DIAGNOSIS — E119 Type 2 diabetes mellitus without complications: Secondary | ICD-10-CM | POA: Diagnosis present

## 2018-09-22 DIAGNOSIS — K7460 Unspecified cirrhosis of liver: Secondary | ICD-10-CM | POA: Diagnosis not present

## 2018-09-22 DIAGNOSIS — Z951 Presence of aortocoronary bypass graft: Secondary | ICD-10-CM | POA: Diagnosis not present

## 2018-09-22 DIAGNOSIS — K7469 Other cirrhosis of liver: Secondary | ICD-10-CM | POA: Diagnosis present

## 2018-09-22 DIAGNOSIS — E871 Hypo-osmolality and hyponatremia: Secondary | ICD-10-CM | POA: Diagnosis present

## 2018-09-22 DIAGNOSIS — K7682 Hepatic encephalopathy: Secondary | ICD-10-CM | POA: Diagnosis present

## 2018-09-22 DIAGNOSIS — K219 Gastro-esophageal reflux disease without esophagitis: Secondary | ICD-10-CM | POA: Diagnosis present

## 2018-09-22 DIAGNOSIS — Z955 Presence of coronary angioplasty implant and graft: Secondary | ICD-10-CM | POA: Diagnosis not present

## 2018-09-22 DIAGNOSIS — Z7189 Other specified counseling: Secondary | ICD-10-CM | POA: Diagnosis not present

## 2018-09-22 DIAGNOSIS — I5022 Chronic systolic (congestive) heart failure: Secondary | ICD-10-CM | POA: Diagnosis present

## 2018-09-22 DIAGNOSIS — K2270 Barrett's esophagus without dysplasia: Secondary | ICD-10-CM | POA: Diagnosis present

## 2018-09-22 DIAGNOSIS — K7290 Hepatic failure, unspecified without coma: Secondary | ICD-10-CM | POA: Diagnosis not present

## 2018-09-22 DIAGNOSIS — E785 Hyperlipidemia, unspecified: Secondary | ICD-10-CM | POA: Diagnosis present

## 2018-09-22 DIAGNOSIS — K7200 Acute and subacute hepatic failure without coma: Secondary | ICD-10-CM | POA: Diagnosis present

## 2018-09-22 DIAGNOSIS — R4182 Altered mental status, unspecified: Secondary | ICD-10-CM | POA: Diagnosis present

## 2018-09-22 DIAGNOSIS — I252 Old myocardial infarction: Secondary | ICD-10-CM | POA: Diagnosis not present

## 2018-09-22 DIAGNOSIS — I2510 Atherosclerotic heart disease of native coronary artery without angina pectoris: Secondary | ICD-10-CM | POA: Diagnosis present

## 2018-09-22 DIAGNOSIS — Z66 Do not resuscitate: Secondary | ICD-10-CM | POA: Diagnosis present

## 2018-09-22 DIAGNOSIS — Z8249 Family history of ischemic heart disease and other diseases of the circulatory system: Secondary | ICD-10-CM | POA: Diagnosis not present

## 2018-09-22 DIAGNOSIS — I110 Hypertensive heart disease with heart failure: Secondary | ICD-10-CM | POA: Diagnosis present

## 2018-09-22 LAB — AMMONIA: AMMONIA: 94 umol/L — AB (ref 9–35)

## 2018-09-22 LAB — CBC
HCT: 32 % — ABNORMAL LOW (ref 36.0–46.0)
Hemoglobin: 10.5 g/dL — ABNORMAL LOW (ref 12.0–15.0)
MCH: 31.3 pg (ref 26.0–34.0)
MCHC: 32.8 g/dL (ref 30.0–36.0)
MCV: 95.2 fL (ref 80.0–100.0)
NRBC: 0 % (ref 0.0–0.2)
PLATELETS: 84 10*3/uL — AB (ref 150–400)
RBC: 3.36 MIL/uL — AB (ref 3.87–5.11)
RDW: 17 % — AB (ref 11.5–15.5)
WBC: 7.8 10*3/uL (ref 4.0–10.5)

## 2018-09-22 LAB — GLUCOSE, CAPILLARY
GLUCOSE-CAPILLARY: 205 mg/dL — AB (ref 70–99)
Glucose-Capillary: 189 mg/dL — ABNORMAL HIGH (ref 70–99)
Glucose-Capillary: 178 mg/dL — ABNORMAL HIGH (ref 70–99)
Glucose-Capillary: 156 mg/dL — ABNORMAL HIGH (ref 70–99)
Glucose-Capillary: 202 mg/dL — ABNORMAL HIGH (ref 70–99)

## 2018-09-22 LAB — COMPREHENSIVE METABOLIC PANEL
ALBUMIN: 2.4 g/dL — AB (ref 3.5–5.0)
ALT: 19 U/L (ref 0–44)
AST: 45 U/L — AB (ref 15–41)
Alkaline Phosphatase: 136 U/L — ABNORMAL HIGH (ref 38–126)
Anion gap: 8 (ref 5–15)
BUN: 23 mg/dL (ref 8–23)
CHLORIDE: 97 mmol/L — AB (ref 98–111)
CO2: 29 mmol/L (ref 22–32)
Calcium: 8.4 mg/dL — ABNORMAL LOW (ref 8.9–10.3)
Creatinine, Ser: 1.37 mg/dL — ABNORMAL HIGH (ref 0.44–1.00)
GFR calc Af Amer: 46 mL/min — ABNORMAL LOW (ref >60)
GFR calc non Af Amer: 40 mL/min — ABNORMAL LOW (ref >60)
GLUCOSE: 193 mg/dL — AB (ref 70–99)
POTASSIUM: 3.7 mmol/L (ref 3.5–5.1)
Sodium: 134 mmol/L — ABNORMAL LOW (ref 135–145)
Total Bilirubin: 5.1 mg/dL — ABNORMAL HIGH (ref 0.3–1.2)
Total Protein: 5.2 g/dL — ABNORMAL LOW (ref 6.5–8.1)

## 2018-09-22 LAB — PROTIME-INR
INR: 1.41
PROTHROMBIN TIME: 17.1 s — AB (ref 11.4–15.2)

## 2018-09-22 LAB — VITAMIN B12: Vitamin B-12: 1750 pg/mL — ABNORMAL HIGH (ref 180–914)

## 2018-09-22 LAB — SEDIMENTATION RATE: SED RATE: 15 mm/h (ref 0–22)

## 2018-09-22 MED ORDER — LACTULOSE ENEMA
300.0000 mL | Freq: Three times a day (TID) | ORAL | Status: DC
  Administered 2018-09-22 – 2018-09-23 (×4): 300 mL via RECTAL
  Filled 2018-09-22 (×10): qty 300

## 2018-09-22 MED ORDER — POTASSIUM CHLORIDE IN NACL 20-0.9 MEQ/L-% IV SOLN
INTRAVENOUS | Status: DC
  Administered 2018-09-22 – 2018-09-27 (×5): via INTRAVENOUS

## 2018-09-22 MED ORDER — LEVOTHYROXINE SODIUM 75 MCG PO TABS
75.0000 ug | ORAL_TABLET | Freq: Every day | ORAL | Status: DC
  Administered 2018-09-24 – 2018-09-29 (×6): 75 ug via ORAL
  Filled 2018-09-22 (×7): qty 1

## 2018-09-22 MED ORDER — INSULIN ASPART 100 UNIT/ML ~~LOC~~ SOLN
0.0000 [IU] | SUBCUTANEOUS | Status: DC
  Administered 2018-09-22: 3 [IU] via SUBCUTANEOUS
  Administered 2018-09-23: 1 [IU] via SUBCUTANEOUS
  Administered 2018-09-23: 2 [IU] via SUBCUTANEOUS
  Administered 2018-09-23: 1 [IU] via SUBCUTANEOUS

## 2018-09-22 NOTE — Consult Note (Addendum)
  Referring Provider: No ref. provider found Primary Care Physician:  Via, Kevin, MD Primary Gastroenterologist:  [facility] (Dr. Outlaw), Dr. Leiman, [facility], [facility] Dr. Lindsay Stanbrough   Date of Admission: 09/21/18 Date of Consultation: 09/22/18  Reason for Consultation:  Encephalopathy  HPI:  Denise Cuevas is a 66 y.o. year old female with history of NASH cirrhosis, followed by [facility]. Inpatient at [facility] 8/1-8/8 for dyspnea, volume overload, AKI. Treated for pneumonia, underwent diagnostic and therapeutic paras, negative for SBP. She was then admitted again 8/26-9/4 for volume overload. She is on both lactulose and Xifaxan at home. Per note 08/03/18 in Care Everywhere, her liver transplant evaluation was closed in setting of significant debility noted at time of initial evaluation. She has been undergoing cardiopulmonary rehab.   Last seen by [facility] on 9/27, noting confusion intermittently at that time. EUS had been planned due to history of Barrett's, but she was unable to keep this appt due to confusion. History significant for GI bleed in March 2019 in [place], s/p EGD with evidence of large varices and stigmata of recent bleeding. It appears she had repeat EGD at [facility] in August 2019 with fragments of goblet cell metaplasia, unable to definitely note if dysplasia, +candida.   .9/27 at [facility]: Tbili 5.9, Alk Phos 159, AST 69, ALT 29. Admitted yesterday with encephalopathy, sent to ED by home health nurse.  Entire history obtained from husband, Ken, at the bedside, as she is somnolent. He has attempted to give her sips to drink, and she sipped a tiny bit of water then coughed. Coughing for several days, coughing up but swallows it back down. No fever/chills. Will sometimes say her abdomen hurts. Husband states at times abdomen is hard as a brick, sometimes not.  At home was having 1-1.5 bowel movements. Lactulose 30 ml TID. Had been on 45 ml in the past  but would fight it. Past 2 days prior to admission was worse regarding confusion. Still would sit up in front of TV, sleep, move around, worked with therapist on Friday. After Friday started going downhill. Husband states lower extremity edema much improved.   Finished cardiopulmonary rehab but has not done well. Hard to have 2 days in a row feeling good. Per husband, color looks about the same.   Past Medical History:  Diagnosis Date  . Angina   . Asthma   . CHF (congestive heart failure) (HCC)   . Cirrhosis of liver (HCC) 08/2014   idiopathic  . COPD (chronic obstructive pulmonary disease) (HCC)   . Coronary artery disease    s/p CABG  . Depression   . Diabetes mellitus    type 2  . Dyspnea   . Edema extremities    consistent edema lower legs, feet, and right quadrant abdominal pain-"goes and comes"  . Family history of adverse reaction to anesthesia    SISTER HAS NAUSEA  . Fibromyalgia   . GERD (gastroesophageal reflux disease)   . H/O hiatal hernia   . Headache(784.0)    occ. generalized  . Heart murmur   . Hypercholesteremia   . Hypertension   . Hypothyroid   . Myocardial infarction (HCC) 11/2011   MI x2- 4'13,5'13 coronary stent(chest pain episode at time)  . PONV (postoperative nausea and vomiting)     Past Surgical History:  Procedure Laterality Date  . AORTIC VALVE REPLACEMENT N/A 11/12/2017   Procedure: AORTIC VALVE REPLACEMENT (AVR)  USING 19 MM MAGNA EASE PERICARDIAL BIOPROSTHESIS-AORTIC, MODEL   3300TFX;  Surgeon: [name], MD;  Location: [place];  Service: Open Heart Surgery;  Laterality: N/A;  . ARTERIAL LINE INSERTION Right 11/12/2017   Procedure: ARTERIAL LINE INSERTION;  Surgeon: [name], MD;  Location: [place];  Service: Open Heart Surgery;  Laterality: Right;  placed in right femoral artery.  . CHOLECYSTECTOMY     open many yrs ago  . COLONOSCOPY WITH PROPOFOL N/A 08/24/2014   internal hemorrhoids, small tubular adenoma in transverse colon,  surveillance 5 years if health permits  . CORONARY ANGIOPLASTY WITH STENT PLACEMENT  05/06/2012  . CORONARY ARTERY BYPASS GRAFT  11/27/2011   Procedure: CORONARY ARTERY BYPASS GRAFTING (CABG);  Surgeon: [name], MD;  Location: [place];  Service: Open Heart Surgery;  Laterality: N/A;  coronary artery bypass graft on pump times two utilizing left internal mammary artery and right saphenous vein harvested endoscopically   . ESOPHAGOGASTRODUODENOSCOPY (EGD) WITH PROPOFOL N/A 08/24/2014   Dr. Paulita Fujita: small esophageal varices and long segment Barrett's, no biopsy because of underlying esophageal varices, some nodularity in antrum  . ESOPHAGOGASTRODUODENOSCOPY (EGD) WITH PROPOFOL N/A 02/16/2018   Dr. Alessandra Bevels: three columns of oozing, large > 5 mm varices in distal esophagus, stigmata of recent bleeding, red wale signs present. 5 bands placed with complete eradication. one superficial esophageal ulcer in distal esophagus. medium amount of food residue in stomach  . KNEE SURGERY Right    scope surgery  . LEFT HEART CATHETERIZATION WITH CORONARY ANGIOGRAM N/A 11/19/2011   Procedure: LEFT HEART CATHETERIZATION WITH CORONARY ANGIOGRAM;  Surgeon: Candee Furbish, MD;  Location: [facility] CATH LAB;  Service: Cardiovascular;  Laterality: N/A;  . LEFT HEART CATHETERIZATION WITH CORONARY ANGIOGRAM  05/06/2012   Procedure: LEFT HEART CATHETERIZATION WITH CORONARY ANGIOGRAM;  Surgeon: Jettie Booze, MD;  Location: [facility] CATH LAB;  Service: Cardiovascular;;  . LEFT HEART CATHETERIZATION WITH CORONARY/GRAFT ANGIOGRAM N/A 03/24/2012   Procedure: LEFT HEART CATHETERIZATION WITH Beatrix Fetters;  Surgeon: Jettie Booze, MD;  Location: [facility] CATH LAB;  Service: Cardiovascular;  Laterality: N/A;  . MITRAL VALVE REPLACEMENT N/A 11/12/2017   Procedure: MITRAL VALVE (MV) REPLACEMENT USING 25 MM MAGNA MITRAL EASE PERICARDIAL BIOPROSTHESIS, MODEL 7300TFX;  Surgeon: [name], MD;  Location: [place];  Service: Open  Heart Surgery;  Laterality: N/A;  Using 1m Magna Ease Mitral Pericardial Bioprosthesis  . PERCUTANEOUS CORONARY STENT INTERVENTION (PCI-S)  03/24/2012   Procedure: PERCUTANEOUS CORONARY STENT INTERVENTION (PCI-S);  Surgeon: JJettie Booze MD;  [facility];  Service: Cardiovascular;;  . PERCUTANEOUS CORONARY STENT INTERVENTION (PCI-S) Bilateral 05/06/2012   Procedure: PERCUTANEOUS CORONARY STENT INTERVENTION (PCI-S);  Surgeon: JJettie Booze MD;  [facility];  Service: Cardiovascular;  Laterality: Bilateral;  . RIGHT/LEFT HEART CATH AND CORONARY/GRAFT ANGIOGRAPHY N/A 10/20/2017   Procedure: RIGHT/LEFT HEART CATH AND CORONARY/GRAFT ANGIOGRAPHY;  Surgeon: MBurnell Blanks MD;  [place];  Service: Cardiovascular;  Laterality: N/A;  . TEE WITHOUT CARDIOVERSION N/A 10/20/2017   Procedure: TRANSESOPHAGEAL ECHOCARDIOGRAM (TEE);  Surgeon: NDorothy Spark MD;  [facility];  Service: Cardiovascular;  Laterality: N/A;  . TEE WITHOUT CARDIOVERSION N/A 11/12/2017   Procedure: TRANSESOPHAGEAL ECHOCARDIOGRAM (TEE);  Surgeon: GGrace Isaac MD;  [place]  Service: Open Heart Surgery;  Laterality: N/A;    Prior to Admission medications   Medication Sig Start Date End Date Taking? Authorizing Provider  albuterol (PROVENTIL HFA;VENTOLIN HFA) 108 (90 BASE) MCG/ACT inhaler Inhale 2 puffs into the lungs every 6 (  six) hours as needed for wheezing or shortness of breath. 02/04/15  Yes Ghimire, Shanker M, MD  albuterol (PROVENTIL) (2.5 MG/3ML) 0.083% nebulizer solution Take 3 mLs (2.5 mg total) by nebulization every 2 (two) hours as needed for wheezing. 02/04/15  Yes Ghimire, Shanker M, MD  bumetanide (BUMEX) 2 MG tablet Take 2 mg by mouth 2 (two) times daily.   Yes [provider]  DULoxetine (CYMBALTA) 30 MG capsule Take 30 mg by mouth daily with lunch. In conjunction with one 60 mg capsule to equal a total of 90 milligrams   Yes  [provider]  DULoxetine (CYMBALTA) 60 MG capsule Take 60 mg by mouth daily with lunch. In conjunction with one 30 mg capsule to equal a total of 90 milligrams   Yes [provider]  furosemide (LASIX) 40 MG tablet Take 40 mg by mouth daily.   Yes [provider]  GENERLAC 10 GM/15ML SOLN Take 20 g by mouth 3 (three) times daily. *Titrate to 3 to 4 bowel movements daily 08/12/18  Yes [provider]  HUMALOG MIX 75/25 KWIKPEN (75-25) 100 UNIT/ML Kwikpen Inject 25 Units into the skin 2 (two) times daily. Patient taking differently: Inject 20 Units into the skin 2 (two) times daily.  02/20/18  Yes Samtani, Jai-Gurmukh, MD  ketoconazole (NIZORAL) 2 % cream Apply 1 application topically daily as needed for irritation.  08/12/18  Yes [provider]  levothyroxine (SYNTHROID, LEVOTHROID) 75 MCG tablet Take 75 mcg by mouth daily before breakfast.  08/12/18  Yes [provider]  mometasone-formoterol (DULERA) 100-5 MCG/ACT AERO Inhale 2 puffs into the lungs 2 (two) times daily as needed for wheezing or shortness of breath.   Yes [provider]  NON FORMULARY Apply 1 application topically daily as needed (for muscle cramps; Theraworx (Olibanum 8x HPUS)).   Yes [provider]  ondansetron (ZOFRAN-ODT) 4 MG disintegrating tablet Take 4 mg by mouth every 6 (six) hours as needed for nausea or vomiting.  09/16/18  Yes [provider]  pantoprazole (PROTONIX) 40 MG tablet Take 40 mg by mouth 2 (two) times daily. 08/12/18  Yes [provider]  Propylene Glycol 0.6 % SOLN Apply 1 drop to eye daily as needed (for dry eye relief).    Yes [provider]  spironolactone (ALDACTONE) 25 MG tablet Take 1 tablet (25 mg total) by mouth daily. 11/23/16  Yes Hongalgi, Anand D, MD  XIFAXAN 550 MG TABS tablet Take 550 mg by mouth 2 (two) times daily. 01/20/18  Yes [provider]    Current Facility-Administered Medications   Medication Dose Route Frequency Provider Last Rate Last Dose  . 0.9 %  sodium chloride infusion  250 mL Intravenous PRN Kim, James, MD      . albuterol (PROVENTIL) (2.5 MG/3ML) 0.083% nebulizer solution 2.5 mg  2.5 mg Nebulization Q2H PRN Kim, James, MD      . DULoxetine (CYMBALTA) DR capsule 30 mg  30 mg Oral Q lunch Kim, James, MD      . DULoxetine (CYMBALTA) DR capsule 60 mg  60 mg Oral Q lunch Kim, James, MD      . feeding supplement (PRO-STAT SUGAR FREE 64) liquid 30 mL  30 mL Oral BID Kim, James, MD      . furosemide (LASIX) tablet 40 mg  40 mg Oral Daily Kim, James, MD      . insulin aspart (novoLOG) injection 0-5 Units  0-5 Units Subcutaneous QHS Kim, James, MD      .   insulin aspart (novoLOG) injection 0-9 Units  0-9 Units Subcutaneous TID WC Kim, James, MD   3 Units at 09/22/18 1251  . lactulose (CHRONULAC) 10 GM/15ML solution 20 g  20 g Oral TID Kim, James, MD   20 g at 09/21/18 2146  . lactulose (CHRONULAC) enema 200 gm  300 mL Rectal TID Boone, Anna W, NP   300 mL at 09/22/18 1230  . levothyroxine (SYNTHROID, LEVOTHROID) tablet 75 mcg  75 mcg Oral QAC breakfast Johnson, Clanford L, MD      . MEDLINE mouth rinse  15 mL Mouth Rinse BID Kim, James, MD   15 mL at 09/22/18 1252  . ondansetron (ZOFRAN) injection 4 mg  4 mg Intravenous Q6H PRN Kim, James, MD      . pantoprazole (PROTONIX) EC tablet 40 mg  40 mg Oral Daily Kim, James, MD      . rifaximin (XIFAXAN) tablet 550 mg  550 mg Oral BID Kim, James, MD      . sodium chloride flush (NS) 0.9 % injection 3 mL  3 mL Intravenous Q12H Kim, James, MD   3 mL at 09/22/18 1252  . sodium chloride flush (NS) 0.9 % injection 3 mL  3 mL Intravenous PRN Kim, James, MD      . spironolactone (ALDACTONE) tablet 25 mg  25 mg Oral Daily Kim, James, MD        Allergies as of 09/21/2018 - Review Complete 09/21/2018  Allergen Reaction Noted  . Exenatide Nausea And Vomiting 11/19/2011  . Ace inhibitors Other (See Comments) 04/20/2015    Family  History  Problem Relation Age of Onset  . Heart disease Father 54       died of MI  . Emphysema Father        smoked  . Peripheral vascular disease Mother 79       bilaterial amputations  . Breast cancer Neg Hx   . Colon cancer Neg Hx     Social History   Socioeconomic History  . Marital status: Married    Spouse name: Not on file  . Number of children: Not on file  . Years of education: Not on file  . Highest education level: Not on file  Occupational History  . Occupation: Retired school admin  Social Needs  . Financial resource strain: Not on file  . Food insecurity:    Worry: Not on file    Inability: Not on file  . Transportation needs:    Medical: Not on file    Non-medical: Not on file  Tobacco Use  . Smoking status: Never Smoker  . Smokeless tobacco: Never Used  Substance and Sexual Activity  . Alcohol use: No  . Drug use: No  . Sexual activity: Yes  Lifestyle  . Physical activity:    Days per week: Not on file    Minutes per session: Not on file  . Stress: Not on file  Relationships  . Social connections:    Talks on phone: Not on file    Gets together: Not on file    Attends religious service: Not on file    Active member of club or organization: Not on file    Attends meetings of clubs or organizations: Not on file    Relationship status: Not on file  . Intimate partner violence:    Fear of current or ex partner: Not on file    Emotionally abused: Not on file    Physically abused: Not on   file    Forced sexual activity: Not on file  Other Topics Concern  . Not on file  Social History Narrative   Retired Naval architect          Review of Systems: Unable to obtain.   Physical Exam: Vital signs in last 24 hours: Temp:  [98.2 F (36.8 C)-98.7 F (37.1 C)] 98.7 F (37.1 C) (10/15 0704) Pulse Rate:  [90-95] 90 (10/15 0704) Resp:  [13-20] 20 (10/15 0704) BP: (113-140)/(58-71) 140/58 (10/15 0704) SpO2:  [97 %-99 %] 99 % (10/15  0704) Last BM Date: 09/21/18 General:  Obtunded, jaundiced, chronically ill appearing, cachectic Head:  Normocephalic and atraumatic. Eyes:  +icterus Mouth:  No deformity or lesions, dentition normal. Lungs:  Scattered rhonchi  Heart:  S1 S2 present, systolic murmur,  Abdomen:  Soft, distended, +BS, no TTP, guarding, rebound Rectal:  Deferred until time of colonoscopy.   Msk:  Symmetrical without gross deformities. Normal posture. Extremities:  With trace pitting edema  Neurologic: Unable to assess  Skin:  Intact without significant lesions or rashes.   Intake/Output from previous day: 10/14 0701 - 10/15 0700 In: 979.9 [IV Piggyback:979.9] Out: -  Intake/Output this shift: Total I/O In: -  Out: 1 [Stool:1]  Lab Results: Recent Labs    09/21/18 1211 09/22/18 0419  WBC 9.7 7.8  HGB 11.5* 10.5*  HCT 34.6* 32.0*  PLT 98* 84*   BMET Recent Labs    09/21/18 1211 09/22/18 0419  NA 133* 134*  K 3.7 3.7  CL 93* 97*  CO2 26 29  GLUCOSE 207* 193*  BUN 21 23  CREATININE 1.53* 1.37*  CALCIUM 8.6* 8.4*   LFT Recent Labs    09/21/18 1211 09/22/18 0419  PROT 5.9* 5.2*  ALBUMIN 2.8* 2.4*  AST 48* 45*  ALT 23 19  ALKPHOS 144* 136*  BILITOT 6.0* 5.1*   PT/INR Recent Labs    09/21/18 1211  LABPROT 16.8*  INR 1.37    Studies/Results: Dg Chest 1 View  Result Date: 09/21/2018 CLINICAL DATA:  Altered mental status, cough and congestion, history coronary artery disease post MI, cirrhosis, COPD, type II diabetes mellitus, hypertension EXAM: CHEST  1 VIEW COMPARISON:  Portable exam 1243 hours compared to 07/08/2018 FINDINGS: Normal heart size post median sternotomy, MVR and AVR. Mediastinal contours and pulmonary vascularity normal. Low lung volumes with mild RIGHT basilar atelectasis. No definite infiltrate, pleural effusion or pneumothorax. Bones demineralized. IMPRESSION: Low lung volumes with RIGHT basilar atelectasis. Electronically Signed   By: Lavonia Dana M.D.    On: 09/21/2018 13:04   Ct Head Wo Contrast  Result Date: 09/21/2018 CLINICAL DATA:  Altered level of consciousness. EXAM: CT HEAD WITHOUT CONTRAST TECHNIQUE: Contiguous axial images were obtained from the base of the skull through the vertex without intravenous contrast. COMPARISON:  CT scan of December 17, 2017. FINDINGS: Brain: No evidence of acute infarction, hemorrhage, hydrocephalus, extra-axial collection or mass lesion/mass effect. Vascular: No hyperdense vessel or unexpected calcification. Skull: Normal. Negative for fracture or focal lesion. Sinuses/Orbits: No acute finding. Other: None. IMPRESSION: Normal head CT. Electronically Signed   By: Marijo Conception, M.D.   On: 09/21/2018 14:11    Impression: 67 year old female with history of NASH cirrhosis, followed by [facility] (Dr. Teena Irani), presenting with decompensation and encephalopathy. From review of Care Everywhere, her MELD Na was 23 in Sept 2019 and 24 today. Although lactulose and Xifaxan were prescribed, she has only been having 1 BM daily. No obvious  source for infection that could precipitate encephalopathy. She is obtunded at time of consultation, and oral intake is unsafe at this time. Intermittent coughing noted, and I am concerned about risk of aspiration pneumonia in this setting. I have made her NPO and ordered lactulose enemas.   From review of Care Everywhere, she was not a transplant candidate due to declining health. I have placed a call to Dr. Teena Irani at [facility] to inform of patient's admission.  Imaging most recently completed at [facility] Aug 2019 US liver and doppler protocol, negative for PVT. CT also from Aug 2019. From note dated 09/04/2018 at [facility], she underwent several paras in Aug/September. No prior history of SBP.   Grim prognosis overall and recommend palliative consultation this admission. Would also benefit from therapeutic paracentesis at some point this admission. Low concern for SBP.     Plan: Strict NPO for now: unsafe to take oral medications I have placed a call to Dr. Teena Irani regarding patient's hospitalization and awaiting call as of 1500. Recheck INR Lactulose enemas, resume oral once encephalopathy improved Resume Xifaxan once encephalopathy improved May need paracentesis while inpatient Palliative care consultation   Annitta Needs, PhD, ANP-BC [facility] Gastro[facility]      LOS: 0 days    09/22/2018, 3:51 PM

## 2018-09-22 NOTE — Progress Notes (Signed)
Did bladder scan on patient due to not voiding in a few hours, bladder scan showed >999, paged CWynetta Emery, awaiting new orders.

## 2018-09-22 NOTE — Progress Notes (Addendum)
09/22/2018 6:52 PM  Patient continues to have difficulty with urinary retention.  I think that we need to place a Foley catheter.  I asked the nurse to place the catheter to drainage.  I gave the RN authority to use a coud if needed.  Murvin Natal, MD

## 2018-09-22 NOTE — Progress Notes (Signed)
In and out Cath performed,peri and foley care provided. Output was 500 ml's of tea color urine.patient tolerated procedure well. Kearney Hard RN @ bedside. Will continue to monitor patient.

## 2018-09-22 NOTE — Progress Notes (Addendum)
Called to check on patient around 2pm. Nursing staff state she aroused to turn and answered with one word replies "yes". Still drowsy. No BM yet after enema.

## 2018-09-22 NOTE — Progress Notes (Addendum)
PROGRESS NOTE  Denise Cuevas  JAS:[id number]  DOB: 06-12-1951  DOA: 09/21/2018 PCP: Dineen Kid, MD  Brief Admission Hx: Pt was admitted with severe NASH end stage liver disease with multiple recent admissions to [facility] for hepatic encephalopathy presented to ED with recurrent symptoms of encephalopathy.  Her confusion has been much worse in the past couple of days prior to admission.    MDM/Assessment & Plan:   1. Hepatic encephalopathy -appreciate GI consultation and excellent recommendations.  The patient will remain n.p.o. until her cognitive impairment improves.  Continue lactulose enemas.  Follow ammonia level.  Continue supportive care.  GI has notified her transplant team at [facility].  I agree that her prognosis remains poor at this time and a palliative medicine consult is appropriate.  I have placed a request for palliative medicine consultation. 2. Cirrhosis secondary to NASH - holding oral medications at this time due to HIGH risk for aspiration.  3. GERD - Holding all oral medications at this time.   4. AKI - likely secondary to acute urinary retention - GI team started gentle hydration.   5. Acute urinary retention - Ordered for I/O cath as needed. If recurs, then leave foley in place.  6. Chronic systolic CHF.  DVT prophylaxis: SCDs Code Status: Full  Family Communication: husband Disposition Plan: inpatient treatment required for supportive therapies and specialty consultation  Consultants:  GI  Palliative medicine  Subjective: Pt obtunded and not able to participate  Objective: Vitals:   09/21/18 1716 09/21/18 2313 09/22/18 0704 09/22/18 1603  BP: 113/63 131/71 (!) 140/58 (!) 112/58  Pulse: 95 92 90 92  Resp: 20 20 20 16   Temp:  98.2 F (36.8 C) 98.7 F (37.1 C) 98.6 F (37 C)  TempSrc:  Oral Oral Oral  SpO2: 99% 97% 99% 97%  Weight:      Height:        Intake/Output Summary (Last 24 hours) at 09/22/2018 1626 Last data filed at 09/22/2018 1300 Gross  per 24 hour  Intake -  Output 1 ml  Net -1 ml   Filed Weights   09/21/18 1157  Weight: 83 kg   REVIEW OF SYSTEMS  Unable to obtain due to patient condition  Exam:  General exam: chronically ill appearing female.  Pt is obtunded.   Respiratory system: Shallow breathing bilateral. Cardiovascular system: S1 & S2 heard.  Gastrointestinal system: Abdomen is nondistended, soft and nontender. Normal bowel sounds heard. Central nervous system: Obtunded.   Extremities: no cyanosis.   Data Reviewed: Basic Metabolic Panel: Recent Labs  Lab 09/21/18 1211 09/22/18 0419  NA 133* 134*  K 3.7 3.7  CL 93* 97*  CO2 26 29  GLUCOSE 207* 193*  BUN 21 23  CREATININE 1.53* 1.37*  CALCIUM 8.6* 8.4*   Liver Function Tests: Recent Labs  Lab 09/21/18 1211 09/22/18 0419  AST 48* 45*  ALT 23 19  ALKPHOS 144* 136*  BILITOT 6.0* 5.1*  PROT 5.9* 5.2*  ALBUMIN 2.8* 2.4*   No results for input(s): LIPASE, AMYLASE in the last 168 hours. Recent Labs  Lab 09/21/18 1211 09/22/18 0420  AMMONIA 51* 94*   CBC: Recent Labs  Lab 09/21/18 1211 09/22/18 0419  WBC 9.7 7.8  HGB 11.5* 10.5*  HCT 34.6* 32.0*  MCV 97.2 95.2  PLT 98* 84*   Cardiac Enzymes: No results for input(s): CKTOTAL, CKMB, CKMBINDEX, TROPONINI in the last 168 hours. CBG (last 3)  Recent Labs    09/21/18 2311 09/22/18 0743 09/22/18  [place]   No results found for this or any previous visit (from the past 240 hour(s)).   Studies: Dg Chest 1 View  Result Date: 09/21/2018 CLINICAL DATA:  Altered mental status, cough and congestion, history coronary artery disease post MI, cirrhosis, COPD, type II diabetes mellitus, hypertension EXAM: CHEST  1 VIEW COMPARISON:  Portable exam 1243 hours compared to 07/08/2018 FINDINGS: Normal heart size post median sternotomy, MVR and AVR. Mediastinal contours and pulmonary vascularity normal. Low lung volumes with mild RIGHT basilar atelectasis. No definite  infiltrate, pleural effusion or pneumothorax. Bones demineralized. IMPRESSION: Low lung volumes with RIGHT basilar atelectasis. Electronically Signed   By: Lavonia Dana M.D.   On: 09/21/2018 13:04   Ct Head Wo Contrast  Result Date: 09/21/2018 CLINICAL DATA:  Altered level of consciousness. EXAM: CT HEAD WITHOUT CONTRAST TECHNIQUE: Contiguous axial images were obtained from the base of the skull through the vertex without intravenous contrast. COMPARISON:  CT scan of December 17, 2017. FINDINGS: Brain: No evidence of acute infarction, hemorrhage, hydrocephalus, extra-axial collection or mass lesion/mass effect. Vascular: No hyperdense vessel or unexpected calcification. Skull: Normal. Negative for fracture or focal lesion. Sinuses/Orbits: No acute finding. Other: None. IMPRESSION: Normal head CT. Electronically Signed   By: Marijo Conception, M.D.   On: 09/21/2018 14:11   Scheduled Meds: . DULoxetine  30 mg Oral Q lunch  . DULoxetine  60 mg Oral Q lunch  . feeding supplement (PRO-STAT SUGAR FREE 64)  30 mL Oral BID  . furosemide  40 mg Oral Daily  . insulin aspart  0-9 Units Subcutaneous Q4H  . lactulose  20 g Oral TID  . lactulose  300 mL Rectal TID  . levothyroxine  75 mcg Oral QAC breakfast  . mouth rinse  15 mL Mouth Rinse BID  . pantoprazole  40 mg Oral Daily  . rifaximin  550 mg Oral BID  . sodium chloride flush  3 mL Intravenous Q12H  . spironolactone  25 mg Oral Daily   Continuous Infusions: . sodium chloride     Principal Problem:   Altered mental status Active Problems:   Acute hepatic encephalopathy   Acute urinary retention   Type 2 diabetes mellitus, uncontrolled ([place])   Hypothyroidism   Protein-calorie malnutrition, severe ([place])   Hepatic encephalopathy ([place])  Time spent:   Irwin Brakeman, MD, FAAFP [facility] Pager [phone]  If 7PM-7AM, please contact night-coverage [email or web address] Password [email or web address] 09/22/2018, 4:26 PM    LOS: 0 days

## 2018-09-22 NOTE — Progress Notes (Signed)
Inpatient Diabetes Program Recommendations  AACE/ADA: New Consensus Statement on Inpatient Glycemic Control (2015)  Target Ranges:  Prepandial:   less than 140 mg/dL      Peak postprandial:   less than 180 mg/dL (1-2 hours)      Critically ill patients:  140 - 180 mg/dL   Results for Denise Cuevas, ORBACH (MRN [id number]) as of 09/22/2018 14:25  Ref. Range 09/22/2018 07:43 09/22/2018 12:02  Glucose-Capillary Latest Ref Range: 70 - 99 mg/dL 178 (H) 205 (H)      Home DM Meds: Humalog 75/25 Insulin- 20 units BID  Current Orders: Novolog Sensitive Correction Scale/ SSI (0-9 units) TID AC + HS      Currently NPO.    CBGs 178/ 205 mg/dl so far today.  Would not resume 75/25 Insulin as patient currently NPO and pre-mixed insulin not safe for patients who are NPO.     MD- Please consider the following:  1. Change Novolog Correction scale (SSI) to Q4 hours if you suspect patient may have prolonged NPO period  2. Start weight based basal insulin dose: Recommend Lantus 12 units Daily to start (0.15 units/kg dosing based on weight of 83 kg)      --Will follow patient during hospitalization--  Wyn Quaker RN, MSN, CDE Diabetes Coordinator Inpatient Glycemic Control Team Team Pager: [phone] (8a-5p)

## 2018-09-23 ENCOUNTER — Encounter (HOSPITAL_COMMUNITY): Payer: Self-pay | Admitting: Primary Care

## 2018-09-23 DIAGNOSIS — Z515 Encounter for palliative care: Secondary | ICD-10-CM

## 2018-09-23 DIAGNOSIS — R400 Somnolence: Secondary | ICD-10-CM

## 2018-09-23 DIAGNOSIS — Z7189 Other specified counseling: Secondary | ICD-10-CM

## 2018-09-23 LAB — COMPREHENSIVE METABOLIC PANEL
ALBUMIN: 2.3 g/dL — AB (ref 3.5–5.0)
ALT: 20 U/L (ref 0–44)
AST: 43 U/L — ABNORMAL HIGH (ref 15–41)
Alkaline Phosphatase: 130 U/L — ABNORMAL HIGH (ref 38–126)
Anion gap: 9 (ref 5–15)
BUN: 21 mg/dL (ref 8–23)
CO2: 30 mmol/L (ref 22–32)
CREATININE: 1.29 mg/dL — AB (ref 0.44–1.00)
Calcium: 8.2 mg/dL — ABNORMAL LOW (ref 8.9–10.3)
Chloride: 96 mmol/L — ABNORMAL LOW (ref 98–111)
GFR calc non Af Amer: 43 mL/min — ABNORMAL LOW (ref >60)
GFR, EST AFRICAN AMERICAN: 50 mL/min — AB (ref >60)
GLUCOSE: 146 mg/dL — AB (ref 70–99)
Potassium: 3.3 mmol/L — ABNORMAL LOW (ref 3.5–5.1)
SODIUM: 135 mmol/L (ref 135–145)
Total Bilirubin: 5.2 mg/dL — ABNORMAL HIGH (ref 0.3–1.2)
Total Protein: 5.1 g/dL — ABNORMAL LOW (ref 6.5–8.1)

## 2018-09-23 LAB — GLUCOSE, CAPILLARY
GLUCOSE-CAPILLARY: 117 mg/dL — AB (ref 70–99)
GLUCOSE-CAPILLARY: 136 mg/dL — AB (ref 70–99)
GLUCOSE-CAPILLARY: 167 mg/dL — AB (ref 70–99)
GLUCOSE-CAPILLARY: 220 mg/dL — AB (ref 70–99)
Glucose-Capillary: 134 mg/dL — ABNORMAL HIGH (ref 70–99)
Glucose-Capillary: 240 mg/dL — ABNORMAL HIGH (ref 70–99)

## 2018-09-23 LAB — CBC WITH DIFFERENTIAL/PLATELET
ABS IMMATURE GRANULOCYTES: 0.06 10*3/uL (ref 0.00–0.07)
BASOS ABS: 0 10*3/uL (ref 0.0–0.1)
Basophils Relative: 0 %
Eosinophils Absolute: 0.2 10*3/uL (ref 0.0–0.5)
Eosinophils Relative: 3 %
HCT: 32.4 % — ABNORMAL LOW (ref 36.0–46.0)
HEMOGLOBIN: 10.4 g/dL — AB (ref 12.0–15.0)
Immature Granulocytes: 1 %
LYMPHS ABS: 1.8 10*3/uL (ref 0.7–4.0)
LYMPHS PCT: 27 %
MCH: 31.9 pg (ref 26.0–34.0)
MCHC: 32.1 g/dL (ref 30.0–36.0)
MCV: 99.4 fL (ref 80.0–100.0)
MONO ABS: 0.8 10*3/uL (ref 0.1–1.0)
Monocytes Relative: 12 %
NEUTROS ABS: 3.9 10*3/uL (ref 1.7–7.7)
Neutrophils Relative %: 57 %
Platelets: 79 10*3/uL — ABNORMAL LOW (ref 150–400)
RBC: 3.26 MIL/uL — AB (ref 3.87–5.11)
RDW: 17 % — ABNORMAL HIGH (ref 11.5–15.5)
WBC: 6.8 10*3/uL (ref 4.0–10.5)
nRBC: 0 % (ref 0.0–0.2)

## 2018-09-23 LAB — AMMONIA: Ammonia: 56 umol/L — ABNORMAL HIGH (ref 9–35)

## 2018-09-23 LAB — MAGNESIUM: Magnesium: 2.2 mg/dL (ref 1.7–2.4)

## 2018-09-23 MED ORDER — LACTULOSE ENEMA
300.0000 mL | Freq: Four times a day (QID) | ORAL | Status: DC
  Administered 2018-09-23 (×2): 300 mL via RECTAL
  Filled 2018-09-23 (×20): qty 300

## 2018-09-23 MED ORDER — LINACLOTIDE 145 MCG PO CAPS
145.0000 ug | ORAL_CAPSULE | Freq: Every day | ORAL | Status: DC
  Administered 2018-09-23 – 2018-09-29 (×7): 145 ug via ORAL
  Filled 2018-09-23 (×8): qty 1

## 2018-09-23 MED ORDER — INSULIN ASPART 100 UNIT/ML ~~LOC~~ SOLN
0.0000 [IU] | Freq: Three times a day (TID) | SUBCUTANEOUS | Status: DC
  Administered 2018-09-23: 3 [IU] via SUBCUTANEOUS
  Administered 2018-09-24: 2 [IU] via SUBCUTANEOUS
  Administered 2018-09-24: 5 [IU] via SUBCUTANEOUS

## 2018-09-23 MED ORDER — POTASSIUM CHLORIDE 10 MEQ/100ML IV SOLN
10.0000 meq | INTRAVENOUS | Status: AC
  Administered 2018-09-23 (×3): 10 meq via INTRAVENOUS
  Filled 2018-09-23 (×3): qty 100

## 2018-09-23 NOTE — Consult Note (Signed)
Consultation Note Date: 09/23/2018   Patient Name: Denise Cuevas  DOB: [birth year]  MRN: [id number]  Age / Sex: 67 y.o., female  PCP: ViaLennette Bihari, MD Referring Physician: Murlean Iba, MD  Reason for Consultation: Establishing goals of care  HPI/Patient Profile: 67 y.o. female  with past medical history of hypertension, hyperlipidemia, Dm2 hypothyroidism, CAD s/p CABG, CHF, (EF ), Hepatic cirrhosis admitted on 09/21/2018 with hepatic encephalopathy..   Clinical Assessment and Goals of Care: [name] is resting quietly in bed.  She opens her eyes when I call her name, and will make but not keep eye contact.  She is oriented to self only, there is no family at bedside at this time.  Chart review completed, family meeting with husband tomorrow.  Conference with hospitalist related to goals of care.  Healthcare power of attorney NEXT OF KIN    SUMMARY OF RECOMMENDATIONS   24 to 48 hours for outcomes. PMT to have CODE STATUS discussions.  Code Status/Advance Care Planning:  Full code  Symptom Management:   Per hospitalist, no additional needs at this time.  Palliative Prophylaxis:   Frequent Pain Assessment and Turn Reposition  Additional Recommendations (Limitations, Scope, Preferences):  Full Scope Treatment  Psycho-social/Spiritual:   Desire for further Chaplaincy support:no  Additional Recommendations: Caregiving  Support/Resources  Prognosis:   Unable to determine, based on outcomes.  Discharge Planning: To be determined, based on outcomes.  Anticipate home if [name] is able to recover more.      Primary Diagnoses: Present on Admission: . Altered mental status . Acute hepatic encephalopathy . Type 2 diabetes mellitus, uncontrolled ([place]) . Hypothyroidism . Protein-calorie malnutrition, severe ([place]) . Hepatic encephalopathy ([place]) . Acute urinary retention   I  have reviewed the medical record, interviewed the patient and family, and examined the patient. The following aspects are pertinent.  Past Medical History:  Diagnosis Date  . Angina   . Asthma   . CHF (congestive heart failure) ([place])   . Cirrhosis of liver ([place]) 08/2014   idiopathic  . COPD (chronic obstructive pulmonary disease) ([place])   . Coronary artery disease    s/p CABG  . Depression   . Diabetes mellitus    type 2  . Dyspnea   . Edema extremities    consistent edema lower legs, feet, and right quadrant abdominal pain-"goes and comes"  . Family history of adverse reaction to anesthesia    SISTER HAS NAUSEA  . Fibromyalgia   . GERD (gastroesophageal reflux disease)   . H/O hiatal hernia   . Headache(784.0)    occ. generalized  . Heart murmur   . Hypercholesteremia   . Hypertension   . Hypothyroid   . Myocardial infarction [facility]) 11/2011   MI x2- 0'35,0'09 coronary stent(chest pain episode at time)  . PONV (postoperative nausea and vomiting)    Social History   Socioeconomic History  . Marital status: Married    Spouse name: Not on file  . Number of children: Not on file  .  Years of education: Not on file  . Highest education level: Not on file  Occupational History  . Occupation: Retired Education officer, museum  . Financial resource strain: Not on file  . Food insecurity:    Worry: Not on file    Inability: Not on file  . Transportation needs:    Medical: Not on file    Non-medical: Not on file  Tobacco Use  . Smoking status: Never Smoker  . Smokeless tobacco: Never Used  Substance and Sexual Activity  . Alcohol use: No  . Drug use: No  . Sexual activity: Yes  Lifestyle  . Physical activity:    Days per week: Not on file    Minutes per session: Not on file  . Stress: Not on file  Relationships  . Social connections:    Talks on phone: Not on file    Gets together: Not on file    Attends religious service: Not on file    Active member of club  or organization: Not on file    Attends meetings of clubs or organizations: Not on file    Relationship status: Not on file  Other Topics Concern  . Not on file  Social History Narrative   Retired Naval architect         Family History  Problem Relation Age of Onset  . Heart disease Father 62       died of MI  . Emphysema Father        smoked  . Peripheral vascular disease Mother 73       bilaterial amputations  . Breast cancer Neg Hx   . Colon cancer Neg Hx    Scheduled Meds: . DULoxetine  30 mg Oral Q lunch  . DULoxetine  60 mg Oral Q lunch  . feeding supplement (PRO-STAT SUGAR FREE 64)  30 mL Oral BID  . furosemide  40 mg Oral Daily  . insulin aspart  0-9 Units Subcutaneous Q4H  . lactulose  20 g Oral TID  . lactulose  300 mL Rectal Q6H  . levothyroxine  75 mcg Oral QAC breakfast  . mouth rinse  15 mL Mouth Rinse BID  . pantoprazole  40 mg Oral Daily  . rifaximin  550 mg Oral BID  . sodium chloride flush  3 mL Intravenous Q12H  . spironolactone  25 mg Oral Daily   Continuous Infusions: . sodium chloride    . 0.9 % NaCl with KCl 20 mEq / L 50 mL/hr at 09/23/18 0300   PRN Meds:.sodium chloride, albuterol, ondansetron (ZOFRAN) IV, sodium chloride flush Medications Prior to Admission:  Prior to Admission medications   Medication Sig Start Date End Date Taking? Authorizing Provider  albuterol (PROVENTIL HFA;VENTOLIN HFA) 108 (90 BASE) MCG/ACT inhaler Inhale 2 puffs into the lungs every 6 (six) hours as needed for wheezing or shortness of breath. 02/04/15  Yes Ghimire, Henreitta Leber, MD  albuterol (PROVENTIL) (2.5 MG/3ML) 0.083% nebulizer solution Take 3 mLs (2.5 mg total) by nebulization every 2 (two) hours as needed for wheezing. 02/04/15  Yes Ghimire, Henreitta Leber, MD  bumetanide (BUMEX) 2 MG tablet Take 2 mg by mouth 2 (two) times daily.   Yes [provider]  DULoxetine (CYMBALTA) 30 MG capsule Take 30 mg by mouth daily with lunch. In conjunction with one 60 mg  capsule to equal a total of 90 milligrams   Yes [provider]  DULoxetine (CYMBALTA) 60 MG capsule Take 60 mg by mouth  daily with lunch. In conjunction with one 30 mg capsule to equal a total of 90 milligrams   Yes [provider]  furosemide (LASIX) 40 MG tablet Take 40 mg by mouth daily.   Yes [provider]  GENERLAC 10 GM/15ML SOLN Take 20 g by mouth 3 (three) times daily. *Titrate to 3 to 4 bowel movements daily 08/12/18  Yes [provider]  HUMALOG MIX 75/25 KWIKPEN (75-25) 100 UNIT/ML Kwikpen Inject 25 Units into the skin 2 (two) times daily. Patient taking differently: Inject 20 Units into the skin 2 (two) times daily.  02/20/18  Yes Nita Sells, MD  ketoconazole (NIZORAL) 2 % cream Apply 1 application topically daily as needed for irritation.  08/12/18  Yes [provider]  levothyroxine (SYNTHROID, LEVOTHROID) 75 MCG tablet Take 75 mcg by mouth daily before breakfast.  08/12/18  Yes [provider]  mometasone-formoterol (DULERA) 100-5 MCG/ACT AERO Inhale 2 puffs into the lungs 2 (two) times daily as needed for wheezing or shortness of breath.   Yes [provider]  NON FORMULARY Apply 1 application topically daily as needed (for muscle cramps; Theraworx (Olibanum 8x HPUS)).   Yes [provider]  ondansetron (ZOFRAN-ODT) 4 MG disintegrating tablet Take 4 mg by mouth every 6 (six) hours as needed for nausea or vomiting.  09/16/18  Yes [provider]  pantoprazole (PROTONIX) 40 MG tablet Take 40 mg by mouth 2 (two) times daily. 08/12/18  Yes [provider]  Propylene Glycol 0.6 % SOLN Apply 1 drop to eye daily as needed (for dry eye relief).    Yes [provider]  spironolactone (ALDACTONE) 25 MG tablet Take 1 tablet (25 mg total) by mouth daily. 11/23/16  Yes Hongalgi, Lenis Dickinson, MD  XIFAXAN 550 MG TABS tablet Take 550 mg by mouth 2 (two) times daily. 01/20/18  Yes [provider]    Allergies  Allergen Reactions  . Exenatide Nausea And Vomiting  . Ace Inhibitors Other (See Comments)    Pseudoasthma. Pharmacy asked about this and patient does not recall so not sure if true allergy   Review of Systems  Unable to perform ROS: Acuity of condition    Physical Exam  Constitutional: No distress.  Appears acutely/chronically ill, makes and briefly keeps eye contact.  Appears frail  HENT:  Head: Atraumatic.  Cardiovascular: Normal rate.  Pulmonary/Chest: Effort normal. No respiratory distress.  Abdominal: Soft. She exhibits distension. There is no guarding.  Musculoskeletal: She exhibits edema.  Neurological: She is alert.  Oriented to self only  Skin: Skin is warm and dry.  Psychiatric:  Calm and cooperative, confused  Nursing note and vitals reviewed.   Vital Signs: BP 117/66   Pulse 83   Temp 97.9 F (36.6 C) (Oral)   Resp 16   Ht 5' (1.524 m)   Wt 68.5 kg   LMP  (LMP Unknown)   SpO2 98%   BMI 29.49 kg/m  Pain Scale: 0-10   Pain Score: 0-No pain   SpO2: SpO2: 98 % O2 Device:SpO2: 98 % O2 Flow Rate: .   IO: Intake/output summary:   Intake/Output Summary (Last 24 hours) at 09/23/2018 1231 Last data filed at 09/23/2018 0300 Gross per 24 hour  Intake 431.13 ml  Output 1 ml  Net 430.13 ml    LBM: Last BM Date: 09/23/18 Baseline Weight: Weight: 83 kg Most recent weight: Weight: 68.5 kg     Palliative Assessment/Data:   Flowsheet Rows  Most Recent Value  Intake Tab  Referral Department  Hospitalist  Unit at Time of Referral  Cardiac/Telemetry Unit  Palliative Care Primary Diagnosis  Other (Comment) [GI/NASH]  Date Notified  09/22/18  Palliative Care Type  New Palliative care  Reason for referral  Clarify Goals of Care  Date of Admission  09/21/18  Date first seen by Palliative Care  09/23/18  # of days Palliative referral response time  1 Day(s)  # of days IP prior to Palliative referral  1  Clinical Assessment  Palliative  Performance Scale Score  30%  Pain Max last 24 hours  Not able to report  Pain Min Last 24 hours  Not able to report  Dyspnea Max Last 24 Hours  Not able to report  Dyspnea Min Last 24 hours  Not able to report  Psychosocial & Spiritual Assessment  Palliative Care Outcomes      Time In: 1430 Time Out: 1535 Time Total: 35 minutes Greater than 50%  of this time was spent counseling and coordinating care related to the above assessment and plan.  Signed by: Drue Novel, NP   Please contact Palliative Medicine Team phone at [phone] for questions and concerns.  For individual provider: See Shea Evans

## 2018-09-23 NOTE — Progress Notes (Signed)
09/23/2018 2:38 PM  RN Blanch Media says that patient has become alert and oriented and requesting to start clears. Aspiration precautions ordered and can give her oral medications.  Murvin Natal MD

## 2018-09-23 NOTE — Progress Notes (Signed)
As of today, I have not received a return call from the [facility].I called again today and was transferred to the Transplant Coordinator line but had to leave a message. According to documentation in Care Everywhere, she was in the evaluation process for consideration of transplant but was placed on hold due to declining health. Awaiting call back from [facility] to ensure they are aware she is in [facility].  2:12 PM  Received call from [facility], transplant coordinator. Case has been previously closed at [facility] for transplant evaluation due to multiple comorbidities and decline. Cyril Mourning will notify Hepatologists and ensure she has an appt once discharged.  4:50 PM

## 2018-09-23 NOTE — Progress Notes (Signed)
PROGRESS NOTE  Denise Cuevas  JJO:[id number]  DOB: August 10, 1951  DOA: 09/21/2018 PCP: Dineen Kid, MD  Brief Admission Hx: Pt was admitted with severe NASH end stage liver disease with multiple recent admissions to [facility] for hepatic encephalopathy presented to ED with recurrent symptoms of encephalopathy.  Her confusion has been much worse in the past couple of days prior to admission.    MDM/Assessment & Plan:   1. Hepatic encephalopathy -appreciate GI consultation and excellent recommendations.  The patient will remain n.p.o. until her cognitive impairment improves.  Continue lactulose enemas.  Follow ammonia level.  Continue supportive care.  GI has notified her transplant team at [facility].  I agree that her prognosis remains poor at this time and a palliative medicine consult is appropriate.  I have placed a request for palliative medicine consultation.  Pt remains obtunded this morning but is arousable with vigorous stimulation.  Ammonia level is down to 56 today.  2. Cirrhosis secondary to NASH - holding oral medications at this time due to HIGH risk for aspiration.  3. GERD - Holding all oral medications at this time.   4. AKI - likely secondary to acute urinary retention - GI team started gentle hydration.   5. Acute urinary retention - Foley catheter was placed 10/15 due to recurrent retention.  6. Chronic systolic CHF. - well compensated, no acute exacerbation.   DVT prophylaxis: SCDs Code Status: Full  Family Communication: husband Disposition Plan: inpatient treatment required for supportive therapies and specialty consultation  Consultants:  GI  Palliative medicine  Subjective: Pt obtunded and not able to participate  Objective: Vitals:   09/22/18 1603 09/22/18 2110 09/23/18 0500 09/23/18 0627  BP: (!) 112/58 (!) 118/59  117/66  Pulse: 92 88  83  Resp: 16 16  16   Temp: 98.6 F (37 C) 98.3 F (36.8 C)  97.9 F (36.6 C)  TempSrc: Oral Oral  Oral  SpO2: 97% 100%  98%   Weight:   68.5 kg   Height:        Intake/Output Summary (Last 24 hours) at 09/23/2018 1232 Last data filed at 09/23/2018 0300 Gross per 24 hour  Intake 431.13 ml  Output 1 ml  Net 430.13 ml   Filed Weights   09/21/18 1157 09/23/18 0500  Weight: 83 kg 68.5 kg   REVIEW OF SYSTEMS  Unable to obtain due to patient condition  Exam:  General exam: chronically ill appearing female.  Pt is obtunded but arousable with vigorous stimulation but quickly drifts back to sleep.   Respiratory system: Shallow breathing bilateral. Cardiovascular system: S1 & S2 heard.  Gastrointestinal system: Abdomen is distended. Normal bowel sounds heard. Central nervous system: Obtunded.   Extremities: no cyanosis.   Data Reviewed: Basic Metabolic Panel: Recent Labs  Lab 09/21/18 1211 09/22/18 0419 09/23/18 0442  NA 133* 134* 135  K 3.7 3.7 3.3*  CL 93* 97* 96*  CO2 26 29 30   GLUCOSE 207* 193* 146*  BUN 21 23 21   CREATININE 1.53* 1.37* 1.29*  CALCIUM 8.6* 8.4* 8.2*  MG  --   --  2.2   Liver Function Tests: Recent Labs  Lab 09/21/18 1211 09/22/18 0419 09/23/18 0442  AST 48* 45* 43*  ALT 23 19 20   ALKPHOS 144* 136* 130*  BILITOT 6.0* 5.1* 5.2*  PROT 5.9* 5.2* 5.1*  ALBUMIN 2.8* 2.4* 2.3*   No results for input(s): LIPASE, AMYLASE in the last 168 hours. Recent Labs  Lab 09/21/18 1211 09/22/18 0420 09/23/18  0442  AMMONIA 51* 94* 56*   CBC: Recent Labs  Lab 09/21/18 1211 09/22/18 0419 09/23/18 0442  WBC 9.7 7.8 6.8  NEUTROABS  --   --  3.9  HGB 11.5* 10.5* 10.4*  HCT 34.6* 32.0* 32.4*  MCV 97.2 95.2 99.4  PLT 98* 84* 79*   Cardiac Enzymes: No results for input(s): CKTOTAL, CKMB, CKMBINDEX, TROPONINI in the last 168 hours. CBG (last 3)  Recent Labs    09/23/18 0417 09/23/18 0750 09/23/18 1135  GLUCAP 136* 134* 117*   No results found for this or any previous visit (from the past 240 hour(s)).   Studies: Dg Chest 1 View  Result Date: 09/21/2018 CLINICAL  DATA:  Altered mental status, cough and congestion, history coronary artery disease post MI, cirrhosis, COPD, type II diabetes mellitus, hypertension EXAM: CHEST  1 VIEW COMPARISON:  Portable exam 1243 hours compared to 07/08/2018 FINDINGS: Normal heart size post median sternotomy, MVR and AVR. Mediastinal contours and pulmonary vascularity normal. Low lung volumes with mild RIGHT basilar atelectasis. No definite infiltrate, pleural effusion or pneumothorax. Bones demineralized. IMPRESSION: Low lung volumes with RIGHT basilar atelectasis. Electronically Signed   By: Lavonia Dana M.D.   On: 09/21/2018 13:04   Ct Head Wo Contrast  Result Date: 09/21/2018 CLINICAL DATA:  Altered level of consciousness. EXAM: CT HEAD WITHOUT CONTRAST TECHNIQUE: Contiguous axial images were obtained from the base of the skull through the vertex without intravenous contrast. COMPARISON:  CT scan of December 17, 2017. FINDINGS: Brain: No evidence of acute infarction, hemorrhage, hydrocephalus, extra-axial collection or mass lesion/mass effect. Vascular: No hyperdense vessel or unexpected calcification. Skull: Normal. Negative for fracture or focal lesion. Sinuses/Orbits: No acute finding. Other: None. IMPRESSION: Normal head CT. Electronically Signed   By: Marijo Conception, M.D.   On: 09/21/2018 14:11   Scheduled Meds: . DULoxetine  30 mg Oral Q lunch  . DULoxetine  60 mg Oral Q lunch  . feeding supplement (PRO-STAT SUGAR FREE 64)  30 mL Oral BID  . furosemide  40 mg Oral Daily  . insulin aspart  0-9 Units Subcutaneous Q4H  . lactulose  20 g Oral TID  . lactulose  300 mL Rectal Q6H  . levothyroxine  75 mcg Oral QAC breakfast  . mouth rinse  15 mL Mouth Rinse BID  . pantoprazole  40 mg Oral Daily  . rifaximin  550 mg Oral BID  . sodium chloride flush  3 mL Intravenous Q12H  . spironolactone  25 mg Oral Daily   Continuous Infusions: . sodium chloride    . 0.9 % NaCl with KCl 20 mEq / L 50 mL/hr at 09/23/18 0300    Principal Problem:   Altered mental status Active Problems:   Acute hepatic encephalopathy   Hepatic encephalopathy (HCC)   Acute urinary retention   Type 2 diabetes mellitus, uncontrolled (HCC)   Hypothyroidism   Protein-calorie malnutrition, severe ([place])  Time spent:   Irwin Brakeman, MD, FAAFP [facility] Pager [phone]  If 7PM-7AM, please contact night-coverage [email or web address] Password [email or web address] 09/23/2018, 12:32 PM    LOS: 1 day

## 2018-09-23 NOTE — Progress Notes (Signed)
Subjective:  obtunded. Awakens with persistent stimuli. States she is in the hospital but quickly falls back to sleep.   Objective: Vital signs in last 24 hours: Temp:  [97.9 F (36.6 C)-98.6 F (37 C)] 97.9 F (36.6 C) (10/16 0627) Pulse Rate:  [83-92] 83 (10/16 0627) Resp:  [16] 16 (10/16 0627) BP: (112-118)/(58-66) 117/66 (10/16 0627) SpO2:  [97 %-100 %] 98 % (10/16 0627) Weight:  [68.5 kg] 68.5 kg (10/16 0500) Last BM Date: 09/23/18 General: NAD. Obtunded but arousable with physical stimuli Head:  Normocephalic and atraumatic. Eyes:  Sclera clear, no icterus.  Abdomen:  Soft,   Slightly distended.  Normal bowel sounds, without guarding, and without rebound.   Extremities:  Without clubbing, deformity or edema. Neurologic: lethargic.  Skin:  Intact without significant lesions or rashes.  Intake/Output from previous day: 10/15 0701 - 10/16 0700 In: 431.1 [I.V.:431.1] Out: 1 [Stool:1] Intake/Output this shift: No intake/output data recorded.  Lab Results: CBC Recent Labs    09/21/18 1211 09/22/18 0419 09/23/18 0442  WBC 9.7 7.8 6.8  HGB 11.5* 10.5* 10.4*  HCT 34.6* 32.0* 32.4*  MCV 97.2 95.2 99.4  PLT 98* 84* 79*   BMET Recent Labs    09/21/18 1211 09/22/18 0419 09/23/18 0442  NA 133* 134* 135  K 3.7 3.7 3.3*  CL 93* 97* 96*  CO2 26 29 30   GLUCOSE 207* 193* 146*  BUN 21 23 21   CREATININE 1.53* 1.37* 1.29*  CALCIUM 8.6* 8.4* 8.2*   LFTs Recent Labs    09/21/18 1211 09/22/18 0419 09/23/18 0442  BILITOT 6.0* 5.1* 5.2*  ALKPHOS 144* 136* 130*  AST 48* 45* 43*  ALT 23 19 20   PROT 5.9* 5.2* 5.1*  ALBUMIN 2.8* 2.4* 2.3*   No results for input(s): LIPASE in the last 72 hours. PT/INR Recent Labs    09/21/18 1211 09/22/18 1650  LABPROT 16.8* 17.1*  INR 1.37 1.41      Imaging Studies: Dg Chest 1 View  Result Date: 09/21/2018 CLINICAL DATA:  Altered mental status, cough and congestion, history coronary artery disease post MI, cirrhosis, COPD,  type II diabetes mellitus, hypertension EXAM: CHEST  1 VIEW COMPARISON:  Portable exam 1243 hours compared to 07/08/2018 FINDINGS: Normal heart size post median sternotomy, MVR and AVR. Mediastinal contours and pulmonary vascularity normal. Low lung volumes with mild RIGHT basilar atelectasis. No definite infiltrate, pleural effusion or pneumothorax. Bones demineralized. IMPRESSION: Low lung volumes with RIGHT basilar atelectasis. Electronically Signed   By: Lavonia Dana M.D.   On: 09/21/2018 13:04   Ct Head Wo Contrast  Result Date: 09/21/2018 CLINICAL DATA:  Altered level of consciousness. EXAM: CT HEAD WITHOUT CONTRAST TECHNIQUE: Contiguous axial images were obtained from the base of the skull through the vertex without intravenous contrast. COMPARISON:  CT scan of December 17, 2017. FINDINGS: Brain: No evidence of acute infarction, hemorrhage, hydrocephalus, extra-axial collection or mass lesion/mass effect. Vascular: No hyperdense vessel or unexpected calcification. Skull: Normal. Negative for fracture or focal lesion. Sinuses/Orbits: No acute finding. Other: None. IMPRESSION: Normal head CT. Electronically Signed   By: Marijo Conception, M.D.   On: 09/21/2018 14:11  [2 weeks]   Assessment:  67 year old female with history of Nash cirrhosis, followed by [facility] and transplant, with decompensation and encephalopathy.  Meld this admission 24, back in September it was 23.  Family reported she is on lactulose and Xifaxan but typically has a bowel movement once daily.  No overt signs of infection to precipitate encephalopathy.  Head CT this admission unremarkable.  Patient continues to be obtunded this admission receiving lactulose enemas 3 times daily.  Imaging most recently completed at [facility] Aug 2019 US liver and doppler protocol, negative for PVT. CT also from Aug 2019. From note dated 09/04/2018 at [facility], she underwent several paras in Aug/September. No prior history of SBP  We reached out to her  transplant team at [facility], no returned call at this time.  Plan: 1. Increase lactulose enema frequency. If patient does not become more alert within next 24 hours, consider neurology consult.  2. Strict n.p.o. for now as patient is not safe to take oral medications. 3. Agree with palliative care consult.Marland Kitchen  Laureen Ochs. Bernarda Caffey [facility] [phone] 10/16/20191:22 PM     LOS: 1 day

## 2018-09-24 DIAGNOSIS — R188 Other ascites: Secondary | ICD-10-CM

## 2018-09-24 DIAGNOSIS — R4182 Altered mental status, unspecified: Secondary | ICD-10-CM

## 2018-09-24 DIAGNOSIS — K7460 Unspecified cirrhosis of liver: Secondary | ICD-10-CM

## 2018-09-24 DIAGNOSIS — Z7189 Other specified counseling: Secondary | ICD-10-CM

## 2018-09-24 LAB — MAGNESIUM: Magnesium: 2.2 mg/dL (ref 1.7–2.4)

## 2018-09-24 LAB — GLUCOSE, CAPILLARY
GLUCOSE-CAPILLARY: 176 mg/dL — AB (ref 70–99)
GLUCOSE-CAPILLARY: 211 mg/dL — AB (ref 70–99)
Glucose-Capillary: 182 mg/dL — ABNORMAL HIGH (ref 70–99)
Glucose-Capillary: 260 mg/dL — ABNORMAL HIGH (ref 70–99)
Glucose-Capillary: 235 mg/dL — ABNORMAL HIGH (ref 70–99)

## 2018-09-24 LAB — CBC WITH DIFFERENTIAL/PLATELET
Abs Immature Granulocytes: 0.06 10*3/uL (ref 0.00–0.07)
BASOS ABS: 0.1 10*3/uL (ref 0.0–0.1)
Basophils Relative: 1 %
EOS ABS: 0.2 10*3/uL (ref 0.0–0.5)
EOS PCT: 3 %
HEMATOCRIT: 32.5 % — AB (ref 36.0–46.0)
Hemoglobin: 10.6 g/dL — ABNORMAL LOW (ref 12.0–15.0)
Immature Granulocytes: 1 %
LYMPHS ABS: 1.7 10*3/uL (ref 0.7–4.0)
Lymphocytes Relative: 24 %
MCH: 32 pg (ref 26.0–34.0)
MCHC: 32.6 g/dL (ref 30.0–36.0)
MCV: 98.2 fL (ref 80.0–100.0)
Monocytes Absolute: 0.8 10*3/uL (ref 0.1–1.0)
Monocytes Relative: 11 %
NRBC: 0 % (ref 0.0–0.2)
Neutro Abs: 4.3 10*3/uL (ref 1.7–7.7)
Neutrophils Relative %: 60 %
Platelets: 76 10*3/uL — ABNORMAL LOW (ref 150–400)
RBC: 3.31 MIL/uL — ABNORMAL LOW (ref 3.87–5.11)
RDW: 16.8 % — AB (ref 11.5–15.5)
WBC: 7.1 10*3/uL (ref 4.0–10.5)

## 2018-09-24 LAB — COMPREHENSIVE METABOLIC PANEL
ALK PHOS: 131 U/L — AB (ref 38–126)
ALT: 23 U/L (ref 0–44)
AST: 54 U/L — AB (ref 15–41)
Albumin: 2.3 g/dL — ABNORMAL LOW (ref 3.5–5.0)
Anion gap: 10 (ref 5–15)
BILIRUBIN TOTAL: 5.3 mg/dL — AB (ref 0.3–1.2)
BUN: 19 mg/dL (ref 8–23)
CALCIUM: 8.2 mg/dL — AB (ref 8.9–10.3)
CO2: 25 mmol/L (ref 22–32)
Chloride: 99 mmol/L (ref 98–111)
Creatinine, Ser: 1.07 mg/dL — ABNORMAL HIGH (ref 0.44–1.00)
GFR calc Af Amer: >60 mL/min (ref >60)
GFR, EST NON AFRICAN AMERICAN: 54 mL/min — AB (ref >60)
GLUCOSE: 197 mg/dL — AB (ref 70–99)
Potassium: 3.4 mmol/L — ABNORMAL LOW (ref 3.5–5.1)
Sodium: 134 mmol/L — ABNORMAL LOW (ref 135–145)
Total Protein: 5.1 g/dL — ABNORMAL LOW (ref 6.5–8.1)

## 2018-09-24 LAB — AMMONIA: Ammonia: 73 umol/L — ABNORMAL HIGH (ref 9–35)

## 2018-09-24 MED ORDER — INFLUENZA VAC SPLIT QUAD 0.5 ML IM SUSY
0.5000 mL | PREFILLED_SYRINGE | INTRAMUSCULAR | Status: DC
  Filled 2018-09-24 (×4): qty 0.5

## 2018-09-24 MED ORDER — INSULIN ASPART 100 UNIT/ML ~~LOC~~ SOLN
0.0000 [IU] | SUBCUTANEOUS | Status: DC
  Administered 2018-09-24 (×2): 3 [IU] via SUBCUTANEOUS
  Administered 2018-09-25: 2 [IU] via SUBCUTANEOUS
  Administered 2018-09-25: 3 [IU] via SUBCUTANEOUS
  Administered 2018-09-25 (×4): 2 [IU] via SUBCUTANEOUS
  Administered 2018-09-26: 1 [IU] via SUBCUTANEOUS
  Administered 2018-09-26 (×2): 3 [IU] via SUBCUTANEOUS
  Administered 2018-09-26: 2 [IU] via SUBCUTANEOUS
  Administered 2018-09-26 – 2018-09-27 (×2): 5 [IU] via SUBCUTANEOUS
  Administered 2018-09-27: 2 [IU] via SUBCUTANEOUS
  Administered 2018-09-27: 3 [IU] via SUBCUTANEOUS
  Administered 2018-09-27: 2 [IU] via SUBCUTANEOUS
  Administered 2018-09-27: 5 [IU] via SUBCUTANEOUS
  Administered 2018-09-27: 1 [IU] via SUBCUTANEOUS
  Administered 2018-09-28 (×3): 3 [IU] via SUBCUTANEOUS
  Administered 2018-09-28 (×3): 2 [IU] via SUBCUTANEOUS
  Administered 2018-09-29: 3 [IU] via SUBCUTANEOUS
  Administered 2018-09-29: 2 [IU] via SUBCUTANEOUS
  Administered 2018-09-29: 5 [IU] via SUBCUTANEOUS
  Administered 2018-09-29: 3 [IU] via SUBCUTANEOUS
  Administered 2018-09-29: 2 [IU] via SUBCUTANEOUS

## 2018-09-24 NOTE — Progress Notes (Signed)
Subjective:  Patient is very lethargic. Yesterday had some period of being more alert. Took oral lactulose three doses since 1540 yesterday. Trial of enema last night, patient could not retain lactulose. Took her xifaxan and Linzess this morning.   Objective: Vital signs in last 24 hours: Temp:  [97.6 F (36.4 C)-98.4 F (36.9 C)] 97.6 F (36.4 C) (10/17 0500) Pulse Rate:  [86-88] 88 (10/17 0500) Resp:  [16] 16 (10/17 0500) BP: (115-129)/(47-65) 115/47 (10/17 0500) SpO2:  [99 %-100 %] 99 % (10/17 0500) Last BM Date: 09/24/18 General:  Lethargic but arousable. Quickly falls back to sleep. Chronically ill appearing.  Head:  Normocephalic and atraumatic. Eyes:  Sclera clear, + icterus.  Abdomen:  Soft, nontender, slight distention. Normal bowel sounds, without guarding, and without rebound.   Extremities:  Without clubbing, deformity or edema. Neurologic: lethargic Skin:  jaundice   Intake/Output from previous day: 10/16 0701 - 10/17 0700 In: -  Out: 425 [Urine:425] Intake/Output this shift: No intake/output data recorded.  Lab Results: CBC Recent Labs    09/22/18 0419 09/23/18 0442 09/24/18 0459  WBC 7.8 6.8 7.1  HGB 10.5* 10.4* 10.6*  HCT 32.0* 32.4* 32.5*  MCV 95.2 99.4 98.2  PLT 84* 79* 76*   BMET Recent Labs    09/22/18 0419 09/23/18 0442 09/24/18 0459  NA 134* 135 134*  K 3.7 3.3* 3.4*  CL 97* 96* 99  CO2 29 30 25   GLUCOSE 193* 146* 197*  BUN 23 21 19   CREATININE 1.37* 1.29* 1.07*  CALCIUM 8.4* 8.2* 8.2*   LFTs Recent Labs    09/22/18 0419 09/23/18 0442 09/24/18 0459  BILITOT 5.1* 5.2* 5.3*  ALKPHOS 136* 130* 131*  AST 45* 43* 54*  ALT 19 20 23   PROT 5.2* 5.1* 5.1*  ALBUMIN 2.4* 2.3* 2.3*   No results for input(s): LIPASE in the last 72 hours. PT/INR Recent Labs    09/21/18 1211 09/22/18 1650  LABPROT 16.8* 17.1*  INR 1.37 1.41      Imaging Studies: Dg Chest 1 View  Result Date: 09/21/2018 CLINICAL DATA:  Altered mental status,  cough and congestion, history coronary artery disease post MI, cirrhosis, COPD, type II diabetes mellitus, hypertension EXAM: CHEST  1 VIEW COMPARISON:  Portable exam 1243 hours compared to 07/08/2018 FINDINGS: Normal heart size post median sternotomy, MVR and AVR. Mediastinal contours and pulmonary vascularity normal. Low lung volumes with mild RIGHT basilar atelectasis. No definite infiltrate, pleural effusion or pneumothorax. Bones demineralized. IMPRESSION: Low lung volumes with RIGHT basilar atelectasis. Electronically Signed   By: Lavonia Dana M.D.   On: 09/21/2018 13:04   Ct Head Wo Contrast  Result Date: 09/21/2018 CLINICAL DATA:  Altered level of consciousness. EXAM: CT HEAD WITHOUT CONTRAST TECHNIQUE: Contiguous axial images were obtained from the base of the skull through the vertex without intravenous contrast. COMPARISON:  CT scan of December 17, 2017. FINDINGS: Brain: No evidence of acute infarction, hemorrhage, hydrocephalus, extra-axial collection or mass lesion/mass effect. Vascular: No hyperdense vessel or unexpected calcification. Skull: Normal. Negative for fracture or focal lesion. Sinuses/Orbits: No acute finding. Other: None. IMPRESSION: Normal head CT. Electronically Signed   By: Marijo Conception, M.D.   On: 09/21/2018 14:11  [2 weeks]   Assessment: 67 year old female with history of Nash cirrhosis, followed by [facility], with decompensation and encephalopathy.  Meld this admission 24, in September it was 36.  As noted yesterday, patient is not consistent with taking her lactulose but has been taking Xifaxan regularly.  Having a bowel movement every other day or so at home.  No overt signs of infection to precipitate encephalopathy.  Head CT this admission unremarkable  Imaging most recently completed at [facility] Aug 2019 US liver and doppler protocol, negative for PVT. CT also from Aug 2019. From note dated 09/04/2018 at [facility], she underwent several paras in Aug/September. No prior history  of SBP  Received call from [facility], transplant coordinator yesterday afternoon, case has been closed at [facility] for transplant evaluation due to multiple comorbidities and decline.  Her hepatologist is being notified to ensure appointment once discharged.  Plan: 1. Continue oral lactulose, Linzess, Xifaxan, lactulose enemas today. Goal of discontinuing lactulose oral/PR was more alert and continuing Linzess/Xifaxan as outpatient.  2. Consider neurology consultation.  3. Labs in am.   Laureen Ochs. Bernarda Caffey [facility] [phone] 10/17/20199:50 AM     LOS: 2 days

## 2018-09-24 NOTE — Progress Notes (Signed)
Patient is very lethargic but was able to be aroused enough for her oral meds this morning including her lactulose. Patient is very confused and has not said much other than "my toes" a few times and will not answer any questions.

## 2018-09-24 NOTE — Progress Notes (Signed)
Inpatient Diabetes Program Recommendations  AACE/ADA: New Consensus Statement on Inpatient Glycemic Control (2015)  Target Ranges:  Prepandial:   less than 140 mg/dL      Peak postprandial:   less than 180 mg/dL (1-2 hours)      Critically ill patients:  140 - 180 mg/dL   Results for LYNETT, BRASIL (MRN [id number]) as of 09/24/2018 12:25  Ref. Range 09/23/2018 07:50 09/23/2018 11:35 09/23/2018 16:46 09/23/2018 21:17  Glucose-Capillary Latest Ref Range: 70 - 99 mg/dL 134 (H)  1 unit NOVOLOG  117 (H)    220 (H)  3 units NOVOLOG  240 (H)   Results for GINNI, EICHLER (MRN [id number]) as of 09/24/2018 12:25  Ref. Range 09/24/2018 07:34 09/24/2018 11:40  Glucose-Capillary Latest Ref Range: 70 - 99 mg/dL 182 (H)  2 units NOVOLOG  260 (H)    Home DM Meds: Humalog 75/25 Insulin- 20 units BID  Current Orders: Novolog Sensitive Correction Scale/ SSI (0-9 units) TID AC       MD- If PO intake poor, would you consider increasing frequency of Novolog Correction scale (SSI) to Q4 hours?      --Will follow patient during hospitalization--  Wyn Quaker RN, MSN, CDE Diabetes Coordinator Inpatient Glycemic Control Team Team Pager: [phone] (8a-5p)   Now on CL diet but unsure how much patient is actually consuming.  Having CBGs >200 mg/dl.

## 2018-09-24 NOTE — Progress Notes (Signed)
PROGRESS NOTE  Denise Cuevas  UXL:[id number]  DOB: 12/09/1951  DOA: 09/21/2018 PCP: Dineen Kid, MD  Brief Admission Hx: Pt was admitted with severe NASH end stage liver disease with multiple recent admissions to [facility] for hepatic encephalopathy presented to ED with recurrent symptoms of encephalopathy.  Her confusion has been much worse in the past couple of days prior to admission.    MDM/Assessment & Plan:   1. Hepatic encephalopathy -appreciate GI consultation and excellent recommendations.  The patient has been having intermittent mental status changes with some brief periods of alertness followed by longer periods of lethargy.  GI prescribing oral and rectal lactulose. However she has not been able to retain recent lactulose enemas.  Ammonia levels have been fluctuating.  Continue supportive care.  GI has notified her transplant team at [facility].  I agree that her prognosis remains poor at this time and a palliative medicine consult is appropriate.  I have placed a request for palliative medicine consultation.   2. Cirrhosis secondary to NASH - GI team restarting on oral medications which she has been able to take.    3. GERD - protonix for GI protection.   4. AKI - likely secondary to acute urinary retention - GI team started gentle hydration and foley placed for relief of obstruction.   5. Acute urinary retention - Foley catheter was placed 10/15 due to recurrent retention failing void trials. 6. Chronic systolic CHF- her home oral meds have been resumed.    DVT prophylaxis: SCDs Code Status: Full  Family Communication: husband Disposition Plan: inpatient treatment required for supportive therapies and specialty consultation  Consultants:  GI  Palliative medicine  Subjective: Pt too lethargic and confused to participate  Objective: Vitals:   09/23/18 0627 09/23/18 1452 09/23/18 2121 09/24/18 0500  BP: 117/66 116/65 129/62 (!) 115/47  Pulse: 83 86 88 88  Resp: 16  16 16     Temp: 97.9 F (36.6 C) 98.1 F (36.7 C) 98.4 F (36.9 C) 97.6 F (36.4 C)  TempSrc: Oral Oral Axillary   SpO2: 98% 100% 100% 99%  Weight:      Height:        Intake/Output Summary (Last 24 hours) at 09/24/2018 1329 Last data filed at 09/24/2018 1200 Gross per 24 hour  Intake 460 ml  Output 425 ml  Net 35 ml   Filed Weights   09/21/18 1157 09/23/18 0500  Weight: 83 kg 68.5 kg   REVIEW OF SYSTEMS  Unable to obtain due to patient condition  Exam:  General exam: chronically ill appearing female.  Pt remains lethargic but more easily arousable, then drifts back to sleep.    Respiratory system: Shallow breathing bilateral. Cardiovascular system: S1 & S2 heard.  Gastrointestinal system: Abdomen is distended. Normal bowel sounds heard. Central nervous system: Obtunded.   Extremities: no cyanosis.   Data Reviewed: Basic Metabolic Panel: Recent Labs  Lab 09/21/18 1211 09/22/18 0419 09/23/18 0442 09/24/18 0459  NA 133* 134* 135 134*  K 3.7 3.7 3.3* 3.4*  CL 93* 97* 96* 99  CO2 26 29 30 25   GLUCOSE 207* 193* 146* 197*  BUN 21 23 21 19   CREATININE 1.53* 1.37* 1.29* 1.07*  CALCIUM 8.6* 8.4* 8.2* 8.2*  MG  --   --  2.2 2.2   Liver Function Tests: Recent Labs  Lab 09/21/18 1211 09/22/18 0419 09/23/18 0442 09/24/18 0459  AST 48* 45* 43* 54*  ALT 23 19 20 23   ALKPHOS 144* 136* 130* 131*  BILITOT 6.0* 5.1* 5.2* 5.3*  PROT 5.9* 5.2* 5.1* 5.1*  ALBUMIN 2.8* 2.4* 2.3* 2.3*   No results for input(s): LIPASE, AMYLASE in the last 168 hours. Recent Labs  Lab 09/21/18 1211 09/22/18 0420 09/23/18 0442 09/24/18 0459  AMMONIA 51* 94* 56* 73*   CBC: Recent Labs  Lab 09/21/18 1211 09/22/18 0419 09/23/18 0442 09/24/18 0459  WBC 9.7 7.8 6.8 7.1  NEUTROABS  --   --  3.9 4.3  HGB 11.5* 10.5* 10.4* 10.6*  HCT 34.6* 32.0* 32.4* 32.5*  MCV 97.2 95.2 99.4 98.2  PLT 98* 84* 79* 76*   Cardiac Enzymes: No results for input(s): CKTOTAL, CKMB, CKMBINDEX, TROPONINI in  the last 168 hours. CBG (last 3)  Recent Labs    09/24/18 0357 09/24/18 0734 09/24/18 1140  GLUCAP 176* 182* 260*   No results found for this or any previous visit (from the past 240 hour(s)).   Studies: No results found. Scheduled Meds: . DULoxetine  30 mg Oral Q lunch  . DULoxetine  60 mg Oral Q lunch  . feeding supplement (PRO-STAT SUGAR FREE 64)  30 mL Oral BID  . furosemide  40 mg Oral Daily  . [START ON 09/25/2018] Influenza vac split quadrivalent PF  0.5 mL Intramuscular Tomorrow-1000  . insulin aspart  0-9 Units Subcutaneous Q4H  . lactulose  20 g Oral TID  . lactulose  300 mL Rectal Q6H  . levothyroxine  75 mcg Oral QAC breakfast  . linaclotide  145 mcg Oral QAC breakfast  . mouth rinse  15 mL Mouth Rinse BID  . pantoprazole  40 mg Oral Daily  . rifaximin  550 mg Oral BID  . sodium chloride flush  3 mL Intravenous Q12H  . spironolactone  25 mg Oral Daily   Continuous Infusions: . sodium chloride    . 0.9 % NaCl with KCl 20 mEq / L 50 mL/hr at 09/23/18 1743   Principal Problem:   Altered mental status Active Problems:   Acute hepatic encephalopathy   Hepatic encephalopathy (HCC)   Acute urinary retention   Type 2 diabetes mellitus, uncontrolled (HCC)   Hypothyroidism   Cirrhosis of liver with ascites (HCC)   Protein-calorie malnutrition, severe (HCC)   Goals of care, counseling/discussion   Palliative care by specialist   DNR (do not resuscitate) discussion  Time spent:   Irwin Brakeman, MD, FAAFP [facility] Pager [phone]  If 7PM-7AM, please contact night-coverage [email or web address] Password [email or web address] 09/24/2018, 1:29 PM    LOS: 2 days

## 2018-09-24 NOTE — Progress Notes (Signed)
Palliative: [name] is lying quietly in bed.  She does not open her eyes unless asked.  She is oriented to self only again today.  Present today at bedside is husband can and his mother Denise Cuevas.  Denise Cuevas and I talk about Denise Cuevas's chronic illness pathway.  He states that her illness started about 1 year ago.  He states that she has done everything that she needed for the transplant evaluation including rehab for valve replacement but she is "too weak" to be on the transplant list.  He tells me that they follow-up approximately every 30 days, but this has been delayed due to her illnesses.  I share a diagram of the chronic illness pathway, what is normal and expected.  We talked about continued declines and illness as normal.  We talked about the treatment plan in detail including information from GI providers.  Denise Cuevas tells me that Denise Cuevas has been unable to keep down her lactulose, and I shared that she was unable to retain the lactulose enema last night.  We talked about healthcare power of attorney, Denise Cuevas is Denise Cuevas, Denise Cuevas.  They do have an adult son. We talked about CODE STATUS in detail.  Denise Cuevas states that at first Denise Cuevas said no CPR, no intubation.  He shares that they have both changed their thoughts to "1 round".  We talked about her continued decline, my worry that she would be come so sick that CPR and intubation would only call suffering.  Also share statistics with him related to in-hospital survival rates.  We talk about "treat the treatable", but no extraordinary measures.  We also briefly discuss the concept of "let nature take it's course". I encourage Denise Cuevas to consider future choices. Conversation with hospitalist related to Denise Cuevas discusstion, [place] discussions. 78 minutes  Denise Axe, NP Palliative Medicine Team Team Phone # [phone]  Greater than 50% of this time was spent counseling and coordinating care related to the above assessment and plan.

## 2018-09-24 NOTE — Progress Notes (Signed)
Patient unable to hold enema. Patient lethargic and displays weak sphincter tone evident by enema immediately exiting rectum upon administration. Patient and bed cleaned and repositioned to a position of stated comfort. Will continue to monitor and treat per MD orders.

## 2018-09-25 DIAGNOSIS — K7200 Acute and subacute hepatic failure without coma: Principal | ICD-10-CM

## 2018-09-25 LAB — CBC WITH DIFFERENTIAL/PLATELET
Abs Immature Granulocytes: 0.08 10*3/uL — ABNORMAL HIGH (ref 0.00–0.07)
BASOS ABS: 0.1 10*3/uL (ref 0.0–0.1)
Basophils Relative: 1 %
EOS PCT: 2 %
Eosinophils Absolute: 0.2 10*3/uL (ref 0.0–0.5)
HCT: 32.3 % — ABNORMAL LOW (ref 36.0–46.0)
HEMOGLOBIN: 10.6 g/dL — AB (ref 12.0–15.0)
Immature Granulocytes: 1 %
LYMPHS ABS: 2.5 10*3/uL (ref 0.7–4.0)
LYMPHS PCT: 27 %
MCH: 31.6 pg (ref 26.0–34.0)
MCHC: 32.8 g/dL (ref 30.0–36.0)
MCV: 96.4 fL (ref 80.0–100.0)
MONO ABS: 1.2 10*3/uL — AB (ref 0.1–1.0)
Monocytes Relative: 13 %
NRBC: 0 % (ref 0.0–0.2)
Neutro Abs: 5.1 10*3/uL (ref 1.7–7.7)
Neutrophils Relative %: 56 %
Platelets: 87 10*3/uL — ABNORMAL LOW (ref 150–400)
RBC: 3.35 MIL/uL — ABNORMAL LOW (ref 3.87–5.11)
RDW: 16.7 % — ABNORMAL HIGH (ref 11.5–15.5)
WBC: 9.1 10*3/uL (ref 4.0–10.5)

## 2018-09-25 LAB — COMPREHENSIVE METABOLIC PANEL
ALK PHOS: 127 U/L — AB (ref 38–126)
ALT: 26 U/L (ref 0–44)
AST: 61 U/L — ABNORMAL HIGH (ref 15–41)
Albumin: 2.3 g/dL — ABNORMAL LOW (ref 3.5–5.0)
Anion gap: 4 — ABNORMAL LOW (ref 5–15)
BUN: 15 mg/dL (ref 8–23)
CALCIUM: 7.8 mg/dL — AB (ref 8.9–10.3)
CO2: 28 mmol/L (ref 22–32)
Chloride: 101 mmol/L (ref 98–111)
Creatinine, Ser: 0.96 mg/dL (ref 0.44–1.00)
GFR calc Af Amer: >60 mL/min (ref >60)
GFR calc non Af Amer: >60 mL/min (ref >60)
GLUCOSE: 199 mg/dL — AB (ref 70–99)
Potassium: 3.8 mmol/L (ref 3.5–5.1)
SODIUM: 133 mmol/L — AB (ref 135–145)
Total Bilirubin: 5.5 mg/dL — ABNORMAL HIGH (ref 0.3–1.2)
Total Protein: 5.1 g/dL — ABNORMAL LOW (ref 6.5–8.1)

## 2018-09-25 LAB — GLUCOSE, CAPILLARY
GLUCOSE-CAPILLARY: 189 mg/dL — AB (ref 70–99)
Glucose-Capillary: 159 mg/dL — ABNORMAL HIGH (ref 70–99)
Glucose-Capillary: 155 mg/dL — ABNORMAL HIGH (ref 70–99)
Glucose-Capillary: 222 mg/dL — ABNORMAL HIGH (ref 70–99)
Glucose-Capillary: 180 mg/dL — ABNORMAL HIGH (ref 70–99)
Glucose-Capillary: 163 mg/dL — ABNORMAL HIGH (ref 70–99)
Glucose-Capillary: 138 mg/dL — ABNORMAL HIGH (ref 70–99)

## 2018-09-25 LAB — AMMONIA: Ammonia: 60 umol/L — ABNORMAL HIGH (ref 9–35)

## 2018-09-25 LAB — PROTIME-INR
INR: 1.56
PROTHROMBIN TIME: 18.5 s — AB (ref 11.4–15.2)

## 2018-09-25 MED ORDER — LACTULOSE ENEMA
300.0000 mL | Freq: Four times a day (QID) | ORAL | Status: DC | PRN
  Filled 2018-09-25: qty 300

## 2018-09-25 MED ORDER — SPIRONOLACTONE 25 MG PO TABS
100.0000 mg | ORAL_TABLET | Freq: Every day | ORAL | Status: DC
  Administered 2018-09-25 – 2018-09-29 (×5): 100 mg via ORAL
  Filled 2018-09-25 (×5): qty 4

## 2018-09-25 MED ORDER — FUROSEMIDE 40 MG PO TABS
40.0000 mg | ORAL_TABLET | Freq: Every day | ORAL | Status: DC
  Administered 2018-09-25 – 2018-09-29 (×5): 40 mg via ORAL
  Filled 2018-09-25 (×5): qty 1

## 2018-09-25 MED ORDER — LACTULOSE 10 GM/15ML PO SOLN
20.0000 g | Freq: Four times a day (QID) | ORAL | Status: DC
  Administered 2018-09-25 – 2018-09-29 (×15): 20 g via ORAL
  Filled 2018-09-25 (×16): qty 30

## 2018-09-25 NOTE — Evaluation (Signed)
Physical Therapy Evaluation Patient Details Name: Denise Cuevas MRN: [id number] DOB: 1951-09-14 Today's Date: 09/25/2018   History of Present Illness  Denise Cuevas  is a 67 y.o. female, w hypertension, hyperlipidemia, Dm2 hypothyroidism, CAD s/p CABG, CHF, (EF ), Hepatic cirrhosis, apparently presents with altered mental status and sent by home health nurse to ER.     Clinical Impression  Patient limited to a few steps at bedside due to weakness, poor standing balance and c/o fatigue demonstrating slow labored movement.  Patient able to transfer to a wheelchair and tolerated sitting up at bedside with family members present.  Patient will benefit from continued physical therapy in hospital and recommended venue below to increase strength, balance, endurance for safe ADLs and gait.    Follow Up Recommendations SNF;Supervision/Assistance - 24 hour;Supervision for mobility/OOB    Equipment Recommendations  None recommended by PT    Recommendations for Other Services       Precautions / Restrictions Precautions Precautions: Fall Restrictions Weight Bearing Restrictions: No      Mobility  Bed Mobility Overal bed mobility: Needs Assistance Bed Mobility: Supine to Sit     Supine to sit: Mod assist     General bed mobility comments: slow labored movement  Transfers Overall transfer level: Needs assistance Equipment used: Rolling walker (2 wheeled) Transfers: Sit to/from [facility] Sit to Stand: Mod assist Stand pivot transfers: Mod assist       General transfer comment: poor balance/tolerance for standing  Ambulation/Gait Ambulation/Gait assistance: Mod assist Gait Distance (Feet): 4 Feet Assistive device: Rolling walker (2 wheeled) Gait Pattern/deviations: Decreased step length - right;Decreased step length - left;Decreased stride length Gait velocity: slow   General Gait Details: limited to 5-6 slow labored short steps at bedside due to BLE  weakness, fatigue and poor standing balance  Stairs            Wheelchair Mobility    Modified Rankin (Stroke Patients Only)       Balance Overall balance assessment: Needs assistance Sitting-balance support: Feet supported;No upper extremity supported Sitting balance-Leahy Scale: Good     Standing balance support: During functional activity;Bilateral upper extremity supported Standing balance-Leahy Scale: Poor Standing balance comment: fair/poor with RW                             Pertinent Vitals/Pain Pain Assessment: No/denies pain    Home Living Family/patient expects to be discharged to:: Private residence Living Arrangements: Spouse/significant other Available Help at Discharge: Family;Available 24 hours/day Type of Home: House Home Access: Ramped entrance     Home Layout: Multi-level;Able to live on main level with bedroom/bathroom Home Equipment: Wheelchair - [employer] - 2 wheels;Bedside commode Additional Comments: ramped entrance per patient    Prior Function Level of Independence: Needs assistance   Gait / Transfers Assistance Needed: hand held assisted for short distanced household gait, uses wheelchair for longer distances  ADL's / Homemaking Assistance Needed: assisted by family        Hand Dominance        Extremity/Trunk Assessment   Upper Extremity Assessment Upper Extremity Assessment: Generalized weakness    Lower Extremity Assessment Lower Extremity Assessment: Generalized weakness    Cervical / Trunk Assessment Cervical / Trunk Assessment: Kyphotic  Communication   Communication: No difficulties  Cognition Arousal/Alertness: Awake/alert Behavior During Therapy: WFL for tasks assessed/performed Overall Cognitive Status: Within Functional Limits for tasks assessed  General Comments      Exercises     Assessment/Plan    PT Assessment Patient needs  continued PT services  PT Problem List Decreased strength;Decreased activity tolerance;Decreased balance;Decreased mobility       PT Treatment Interventions Gait training;Stair training;Functional mobility training;Therapeutic activities;Therapeutic exercise;Patient/family education    PT Goals (Current goals can be found in the Care Plan section)  Acute Rehab PT Goals Patient Stated Goal: return home after rehab PT Goal Formulation: With patient/family Time For Goal Achievement: 10/09/18 Potential to Achieve Goals: Good    Frequency Min 3X/week   Barriers to discharge        Co-evaluation               AM-PAC PT "6 Clicks" Daily Activity  Outcome Measure Difficulty turning over in bed (including adjusting bedclothes, sheets and blankets)?: A Little Difficulty moving from lying on back to sitting on the side of the bed? : A Lot Difficulty sitting down on and standing up from a chair with arms (e.g., wheelchair, bedside commode, etc,.)?: A Lot Help needed moving to and from a bed to chair (including a wheelchair)?: A Lot Help needed walking in hospital room?: A Lot Help needed climbing 3-5 steps with a railing? : Total 6 Click Score: 12    End of Session Equipment Utilized During Treatment: Gait belt Activity Tolerance: Patient tolerated treatment well;Patient limited by fatigue Patient left: in chair;with call bell/phone within reach;with family/visitor present(left in wheelchair) Nurse Communication: Mobility status PT Visit Diagnosis: Unsteadiness on feet (R26.81);Other abnormalities of gait and mobility (R26.89);Muscle weakness (generalized) (M62.81)    Time: 6578-4696 PT Time Calculation (min) (ACUTE ONLY): 32 min   Charges:   PT Evaluation $PT Eval Moderate Complexity: 1 Mod PT Treatments $Therapeutic Activity: 23-37 mins        2:40 PM, 09/25/18 Lonell Grandchild, MPT Physical Therapist with [facility] [phone] office [phone]  mobile phone

## 2018-09-25 NOTE — Care Management Note (Signed)
Case Management Note  Patient Details  Name: Denise Cuevas MRN: [id number] Date of Birth: [birth year]    Pt from home with husband. Active with [facility]. Pt is at baseline able to provide some assistance doing her ADL's. She is ambulatory. PT has recommended SNF. Husband is unsure at this time. He would like to know facility options. Okay with being faxed out to 5 locations in the county with possible plan of returning home with resumption of HH. Will depend on how much pt's functional status improves while in hospital. Discussed with CSW.                   Expected Discharge Date:                  Expected Discharge Plan:     In-House Referral:  Clinical Social Work  Discharge planning Services  CM Consult  Post Acute Care Choice:    Choice offered to:     DME Arranged:    DME Agency:     HH Arranged:  RN, PT [place] Agency:  [place]  Status of Service:  In process, will continue to follow  If discussed at Long Length of Stay Meetings, dates discussed:    Additional Comments:  Sherald Barge, RN 09/25/2018, 12:49 PM

## 2018-09-25 NOTE — NC FL2 (Signed)
[place] LEVEL OF CARE SCREENING TOOL     IDENTIFICATION  Patient Name: Denise Cuevas Birthdate: Aug 10, 1951 Sex: female Admission Date (Current Location): 09/21/2018  [facility] and [place] Number:  [employer] and Address:  [facility] [address] [place]      Provider Number: [id number]  Attending Physician Name and Address:  Kathie Dike, MD  Relative Name and Phone Number:       Current Level of Care: Hospital Recommended Level of Care: [place] Prior Approval Number:    Date Approved/Denied:   PASRR Number:    Discharge Plan: SNF    Current Diagnoses: Patient Active Problem List   Diagnosis Date Noted  . Hyperbilirubinemia   . DNR (do not resuscitate) discussion   . Goals of care, counseling/discussion   . Palliative care by specialist   . Hepatic encephalopathy ([place]) 09/22/2018  . Acute urinary retention 09/22/2018  . Altered mental status 09/21/2018  . Anemia 07/09/2018  . Thrombocytopenia ([place]) 07/09/2018  . HCAP (healthcare-associated pneumonia) 07/09/2018  . AKI (acute kidney injury) ([place]) 07/09/2018  . Lactic acidosis 07/09/2018  . Acute hepatic encephalopathy 12/17/2017  . Atrial fibrillation ([place]) [I48.91] 11/28/2017  . Hospital discharge follow-up 10/30/2017  . Nonrheumatic aortic valve stenosis   . Chronic bronchitis ([place])   . Other chest pain 10/17/2017  . Peripheral edema   . Unstable angina ([place]) 10/15/2017  . Asthma, cough variant   . Cirrhosis of liver with ascites ([place]) 11/20/2016  . Pancytopenia ([place]) 11/20/2016  . Protein-calorie malnutrition, severe ([place]) 11/20/2016  . Acute diastolic CHF (congestive heart failure) ([place])   . Congestive heart failure ([place]) 11/19/2016  . Chest pain 05/29/2015  . Memory loss 05/29/2015  . Essential hypertension 03/20/2015  . Dyspnea 02/02/2015  . Acute respiratory distress 02/02/2015  . Depression   . GERD (gastroesophageal  reflux disease)   . Unstable angina pectoris ([place]) 11/25/2011  . Hypothyroidism   . CAD (coronary artery disease)   . Other and unspecified angina pectoris 11/19/2011  . Type 2 diabetes mellitus, uncontrolled ([place]) 11/19/2011  . Myocardial infarction ([place]) 11/09/2011    Orientation RESPIRATION BLADDER Height & Weight     Self  Normal Incontinent Weight: 151 lb 10.8 oz (68.8 kg) Height:  5' (152.4 cm)  BEHAVIORAL SYMPTOMS/MOOD NEUROLOGICAL BOWEL NUTRITION STATUS      Incontinent Diet(see discharge summary )  AMBULATORY STATUS COMMUNICATION OF NEEDS Skin   Extensive Assist Verbally Normal                       Personal Care Assistance Level of Assistance  Bathing, Feeding, Dressing Bathing Assistance: Maximum assistance Feeding assistance: Limited assistance Dressing Assistance: Maximum assistance     Functional Limitations Info  Sight, Hearing, Speech Sight Info: Adequate Hearing Info: Adequate Speech Info: Adequate    SPECIAL CARE FACTORS FREQUENCY  PT (By licensed PT), Restorative feeding program     PT Frequency: 5x/week              Contractures Contractures Info: Not present    Additional Factors Info  Allergies, Psychotropic, Code Status Code Status Info: Full Code Allergies Info: Exenative, Ace Inhibitors Psychotropic Info: Cymbalta         Current Medications (09/25/2018):  This is the current hospital active medication list Current Facility-Administered Medications  Medication Dose Route Frequency Provider Last Rate Last Dose  . 0.9 %  sodium chloride infusion  250 mL  Intravenous PRN Jani Gravel, MD      . 0.9 % NaCl with KCl 20 mEq/ L  infusion   Intravenous Continuous Danie Binder, MD 50 mL/hr at 09/24/18 1938    . albuterol (PROVENTIL) (2.5 MG/3ML) 0.083% nebulizer solution 2.5 mg  2.5 mg Nebulization Q2H PRN Jani Gravel, MD      . DULoxetine (CYMBALTA) DR capsule 30 mg  30 mg Oral Q lunch Jani Gravel, MD   30 mg at 09/25/18 1023  .  DULoxetine (CYMBALTA) DR capsule 60 mg  60 mg Oral Q lunch Jani Gravel, MD   60 mg at 09/25/18 1023  . feeding supplement (PRO-STAT SUGAR FREE 64) liquid 30 mL  30 mL Oral BID Jani Gravel, MD   30 mL at 09/24/18 2203  . furosemide (LASIX) tablet 40 mg  40 mg Oral Daily Jani Gravel, MD   40 mg at 09/25/18 1000  . Influenza vac split quadrivalent PF (FLUARIX) injection 0.5 mL  0.5 mL Intramuscular Tomorrow-1000 Johnson, Clanford L, MD      . insulin aspart (novoLOG) injection 0-9 Units  0-9 Units Subcutaneous Q4H Johnson, Clanford L, MD   3 Units at 09/25/18 1312  . lactulose (CHRONULAC) 10 GM/15ML solution 20 g  20 g Oral TID Jani Gravel, MD   20 g at 09/25/18 0817  . lactulose (CHRONULAC) enema 200 gm  300 mL Rectal Q6H Mahala Menghini, PA-C   300 mL at 09/23/18 2349  . levothyroxine (SYNTHROID, LEVOTHROID) tablet 75 mcg  75 mcg Oral QAC breakfast Wynetta Emery, Clanford L, MD   75 mcg at 09/25/18 0818  . linaclotide (LINZESS) capsule 145 mcg  145 mcg Oral QAC breakfast Mahala Menghini, PA-C   145 mcg at 09/25/18 1001  . MEDLINE mouth rinse  15 mL Mouth Rinse BID Jani Gravel, MD   15 mL at 09/25/18 1002  . ondansetron (ZOFRAN) injection 4 mg  4 mg Intravenous Q6H PRN Jani Gravel, MD      . pantoprazole (PROTONIX) EC tablet 40 mg  40 mg Oral Daily Jani Gravel, MD   40 mg at 09/25/18 1779  . rifaximin (XIFAXAN) tablet 550 mg  550 mg Oral BID Jani Gravel, MD   550 mg at 09/25/18 0818  . sodium chloride flush (NS) 0.9 % injection 3 mL  3 mL Intravenous Q12H Jani Gravel, MD   3 mL at 09/25/18 1002  . sodium chloride flush (NS) 0.9 % injection 3 mL  3 mL Intravenous PRN Jani Gravel, MD      . spironolactone (ALDACTONE) tablet 25 mg  25 mg Oral Daily Jani Gravel, MD   25 mg at 09/25/18 3903     Discharge Medications: Please see discharge summary for a list of discharge medications.  Relevant Imaging Results:  Relevant Lab Results:   Additional Information SSN [id number], Clydene Pugh, LCSW

## 2018-09-25 NOTE — Progress Notes (Signed)
Subjective: Patient's father and daughter at bedside. Patient is sleepy but arousable. Remains confused. AAOx1 (thinks she's at elementary school, doesn't know the year and the current president is "The President"). Had a BM at 0500 per nursing staff. Denies abdominal pain. Husband states she was nauseated with lactulose and with liquids. No other GI complaints at this time.   Nursing staff states patient took 2 doses of lactulose sucessfully yesterday.  Objective: Vital signs in last 24 hours: Temp:  [97.9 F (36.6 C)-98.2 F (36.8 C)] 98.2 F (36.8 C) (10/18 0523) Pulse Rate:  [89-91] 91 (10/18 0523) Resp:  [18] 18 (10/18 0523) BP: (101-119)/(65-69) 101/65 (10/18 0523) SpO2:  [96 %-100 %] 100 % (10/18 0523) Weight:  [68.8 kg] 68.8 kg (10/18 0523) Last BM Date: 09/24/18 General:   Sleepy and disoriented, pleasant Head:  Normocephalic and atraumatic. Eyes:  Noted scleral icterus. Conjuctiva pink.  Neck:  Supple, without thyromegaly or masses.  Heart:  S1, S2 present, no murmurs noted.  Lungs: Clear to auscultation bilaterally, without wheezing, rales, or rhonchi.  Abdomen:  Bowel sounds present, non-tender, mildly distended but soft. No HSM or hernias noted. No rebound or guarding. No masses appreciated. Jaundice noted. GU: Foley bag with darkened/brown urine noted. Msk:  Symmetrical without gross deformities. Pulses:  Normal bilateral DP pulses noted. Extremities:  Without clubbing or edema. Neurologic:  Sleepy and oriented x 1 Psych:  Sleepy but pleasant mood and affect.  Intake/Output from previous day: 10/17 0701 - 10/18 0700 In: 2112.2 [P.O.:680; I.V.:1432.2] Out: 450 [Urine:450] Intake/Output this shift: No intake/output data recorded.  Lab Results: Recent Labs    09/23/18 0442 09/24/18 0459 09/25/18 0459  WBC 6.8 7.1 9.1  HGB 10.4* 10.6* 10.6*  HCT 32.4* 32.5* 32.3*  PLT 79* 76* 87*   BMET Recent Labs    09/23/18 0442 09/24/18 0459 09/25/18 0459  NA  135 134* 133*  K 3.3* 3.4* 3.8  CL 96* 99 101  CO2 30 25 28   GLUCOSE 146* 197* 199*  BUN 21 19 15   CREATININE 1.29* 1.07* 0.96  CALCIUM 8.2* 8.2* 7.8*   LFT Recent Labs    09/23/18 0442 09/24/18 0459 09/25/18 0459  PROT 5.1* 5.1* 5.1*  ALBUMIN 2.3* 2.3* 2.3*  AST 43* 54* 61*  ALT 20 23 26   ALKPHOS 130* 131* 127*  BILITOT 5.2* 5.3* 5.5*   PT/INR Recent Labs    09/22/18 1650 09/25/18 0459  LABPROT 17.1* 18.5*  INR 1.41 1.56   Hepatitis Panel No results for input(s): HEPBSAG, HCVAB, HEPAIGM, HEPBIGM in the last 72 hours.   Studies/Results: No results found.  Assessment: 67 year old female with history of [place] cirrhosis, followed by [facility], with decompensation and encephalopathy.  Meld this admission 24, in September it was 32.  As noted yesterday, patient is not consistent with taking her lactulose but has been taking Xifaxan regularly.  Having a bowel movement every other day or so at home.  No overt signs of infection to precipitate encephalopathy.  Head CT this admission unremarkable  Imaging most recently completed at [facility] Aug 2019 US liver and doppler protocol, negative for PVT. CT also from Aug 2019. From note dated 09/04/2018 at [facility], she underwent several paras in Aug/September. No prior history of SBP  Our PA received call from [place], transplant coordinatorWednesday afternoon, case has been closed at [facility] for transplant evaluation due to multiple comorbidities and decline.  Her hepatologist is being notified to ensure appointment once discharged.  Despite lactulose, Xifaxan,  Linzess the patient remains confused. Still with jaundice (bili today 5.5). Overall LFTs stable, INR stable. Persistent but stable thrombocytopenia. MELD-Na today is 21 (19.6% 3-month mortality) and Child-Pugh C (12 points; life expectancy 1-3 years)  Given that she is not a transplant candidate, overall her prognosis is poor/grim.  Plan: 1. Continue lactulose po and enemas, Xifaxan,  Linzess 2. Hopefully can transition to po Linzess and Xifaxan (poor outpatient lactulose tolerance) 3. Appreciate palliative care consult 4. Consider PT eval 5. Supportive measures   Thank you for allowing [place] to participate in the care of Lacie Draft, DNP, AGNP-C Adult & Gerontological Nurse Practitioner [facility]     LOS: 3 days    09/25/2018, 8:48 AM

## 2018-09-25 NOTE — Care Management Important Message (Signed)
Important Message  Patient Details  Name: Denise Cuevas MRN: [id number] Date of Birth: 11-15-1951   Medicare Important Message Given:  Yes    Sherald Barge, RN 09/25/2018, 12:52 PM

## 2018-09-25 NOTE — Progress Notes (Signed)
PROGRESS NOTE  Denise Cuevas  TDS:[id number]  DOB: 01/19/1951  DOA: 09/21/2018 PCP: Dineen Kid, MD  Brief Admission Hx: Pt was admitted with severe NASH end stage liver disease with multiple recent admissions to [facility] for hepatic encephalopathy presented to ED with recurrent symptoms of encephalopathy.  Her confusion has been much worse in the past couple of days prior to admission.    MDM/Assessment & Plan:   1. Hepatic encephalopathy -appreciate GI consultation and excellent recommendations.  The patient has been having intermittent mental status changes with some brief periods of alertness followed by longer periods of lethargy.  GI prescribing oral and rectal lactulose. However she has not been able to retain recent lactulose enemas.  Ammonia levels have been fluctuating.  Continue supportive care.  GI has notified her transplant team at [facility].  Apparently, she is no longer a transplant candidate.  Palliative medicine met with the patient and her husband and they wish to continue current treatments at this time.  Her husband does report that she has been eating more today. 2. Cirrhosis secondary to NASH - GI team restarting on oral medications which she has been able to take.   To the lactulose, Xifaxan and Linzess 3. GERD - protonix for GI protection.   4. AKI - likely secondary to acute urinary retention - GI team started gentle hydration and foley placed for relief of obstruction.  Creatinine has improved and is now normal range 5. Acute urinary retention - Foley catheter was placed 10/15 due to recurrent retention failing void trials. 6. Chronic systolic CHF- her home oral meds have been resumed.    DVT prophylaxis: SCDs Code Status: Full  Family Communication: husband Disposition Plan: Pending skilled nursing facility placement  Consultants:  GI  Palliative medicine  Subjective: She denies any pain or shortness of breath.  Objective: Vitals:   09/24/18 2048 09/24/18 2124  09/25/18 0523 09/25/18 1434  BP:  119/69 101/65 (!) 117/50  Pulse:  89 91 93  Resp:  18 18 18   Temp:  97.9 F (36.6 C) 98.2 F (36.8 C) 97.6 F (36.4 C)  TempSrc:   Oral Oral  SpO2: 96% 98% 100% 99%  Weight:   68.8 kg   Height:        Intake/Output Summary (Last 24 hours) at 09/25/2018 1916 Last data filed at 09/25/2018 1800 Gross per 24 hour  Intake 1171.47 ml  Output 450 ml  Net 721.47 ml   Filed Weights   09/21/18 1157 09/23/18 0500 09/25/18 0523  Weight: 83 kg 68.5 kg 68.8 kg   REVIEW OF SYSTEMS  Unable to obtain due to patient condition  Exam:  General exam: Alert, awake, no distress Respiratory system: Clear to auscultation. Respiratory effort normal. Cardiovascular system:RRR. No murmurs, rubs, gallops. Gastrointestinal system: Abdomen is nondistended, soft and nontender. No organomegaly or masses felt. Normal bowel sounds heard. Central nervous system: No focal neurological deficits. Extremities: No C/C/E, +pedal pulses Skin: No rashes, lesions or ulcers Psychiatry: Awake, slow to respond, has difficulty answering basic questions, but can carry on a conversation about other topics.    Data Reviewed: Basic Metabolic Panel: Recent Labs  Lab 09/21/18 1211 09/22/18 0419 09/23/18 0442 09/24/18 0459 09/25/18 0459  NA 133* 134* 135 134* 133*  K 3.7 3.7 3.3* 3.4* 3.8  CL 93* 97* 96* 99 101  CO2 26 29 30 25 28   GLUCOSE 207* 193* 146* 197* 199*  BUN 21 23 21 19 15   CREATININE 1.53* 1.37* 1.29* 1.07*  0.96  CALCIUM 8.6* 8.4* 8.2* 8.2* 7.8*  MG  --   --  2.2 2.2  --    Liver Function Tests: Recent Labs  Lab 09/21/18 1211 09/22/18 0419 09/23/18 0442 09/24/18 0459 09/25/18 0459  AST 48* 45* 43* 54* 61*  ALT 23 19 20 23 26   ALKPHOS 144* 136* 130* 131* 127*  BILITOT 6.0* 5.1* 5.2* 5.3* 5.5*  PROT 5.9* 5.2* 5.1* 5.1* 5.1*  ALBUMIN 2.8* 2.4* 2.3* 2.3* 2.3*   No results for input(s): LIPASE, AMYLASE in the last 168 hours. Recent Labs  Lab  09/21/18 1211 09/22/18 0420 09/23/18 0442 09/24/18 0459 09/25/18 0459  AMMONIA 51* 94* 56* 73* 60*   CBC: Recent Labs  Lab 09/21/18 1211 09/22/18 0419 09/23/18 0442 09/24/18 0459 09/25/18 0459  WBC 9.7 7.8 6.8 7.1 9.1  NEUTROABS  --   --  3.9 4.3 5.1  HGB 11.5* 10.5* 10.4* 10.6* 10.6*  HCT 34.6* 32.0* 32.4* 32.5* 32.3*  MCV 97.2 95.2 99.4 98.2 96.4  PLT 98* 84* 79* 76* 87*   Cardiac Enzymes: No results for input(s): CKTOTAL, CKMB, CKMBINDEX, TROPONINI in the last 168 hours. CBG (last 3)  Recent Labs    09/25/18 0754 09/25/18 1134 09/25/18 1703  GLUCAP 155* 222* 180*   No results found for this or any previous visit (from the past 240 hour(s)).   Studies: No results found. Scheduled Meds: . DULoxetine  30 mg Oral Q lunch  . DULoxetine  60 mg Oral Q lunch  . feeding supplement (PRO-STAT SUGAR FREE 64)  30 mL Oral BID  . furosemide  40 mg Oral Daily  . Influenza vac split quadrivalent PF  0.5 mL Intramuscular Tomorrow-1000  . insulin aspart  0-9 Units Subcutaneous Q4H  . lactulose  20 g Oral QID  . levothyroxine  75 mcg Oral QAC breakfast  . linaclotide  145 mcg Oral QAC breakfast  . mouth rinse  15 mL Mouth Rinse BID  . pantoprazole  40 mg Oral Daily  . rifaximin  550 mg Oral BID  . sodium chloride flush  3 mL Intravenous Q12H  . spironolactone  100 mg Oral Daily   Continuous Infusions: . sodium chloride    . 0.9 % NaCl with KCl 20 mEq / L 50 mL/hr at 09/24/18 1938   Principal Problem:   Altered mental status Active Problems:   Type 2 diabetes mellitus, uncontrolled (HCC)   Hypothyroidism   Cirrhosis of liver with ascites (HCC)   Protein-calorie malnutrition, severe (HCC)   Acute hepatic encephalopathy   Hepatic encephalopathy ([place])   Acute urinary retention   Goals of care, counseling/discussion   Palliative care by specialist   DNR (do not resuscitate) discussion   Hyperbilirubinemia  Time spent: 75mns  JKathie Dike MD [facility] Pager [phone] If 7PM-7AM, please contact night-coverage [email or web address] Password [email or web address] 09/25/2018, 7:16 PM    LOS: 3 days

## 2018-09-25 NOTE — Progress Notes (Signed)
Palliative:  Denise Cuevas, [place], is sitting up in the wheelchair in her room.  Physical therapist is finishing their evaluation.  Present today at bedside is husband Chrissie Noa.  I asked Fraser Din how she feels today, and as expected she has very slowed response.  Physical therapist asked about the possibility of rehab, and although Ken's desires for Fraser Din to return to their home, he does not decline rehab.  Instead, he states that it is Pat's choice.  Fraser Din asks about location of rehab.  I name our local rehab providers and advised that social work can further discussions if they desire. I share that Fraser Din still needs time, supportive care.  She is clearly still confused.  Fraser Din and Yvone Neu deny questions or concerns at this time. 73 minutes Quinn Axe, NP Palliative Medicine Team Team Phone # [phone]  Greater than 50% of this time was spent counseling and coordinating care related to the above assessment and plan.

## 2018-09-25 NOTE — Plan of Care (Signed)
  Problem: Acute Rehab PT Goals(only PT should resolve) Goal: Pt Will Go Supine/Side To Sit Outcome: Progressing Flowsheets (Taken 09/25/2018 1442) Pt will go Supine/Side to Sit: with minimal assist Goal: Patient Will Transfer Sit To/From Stand Outcome: Progressing Flowsheets (Taken 09/25/2018 1442) Patient will transfer sit to/from stand: with minimal assist Goal: Pt Will Transfer Bed To Chair/Chair To Bed Outcome: Progressing Flowsheets (Taken 09/25/2018 1442) Pt will Transfer Bed to Chair/Chair to Bed: with min assist Goal: Pt Will Ambulate Outcome: Progressing Flowsheets (Taken 09/25/2018 1442) Pt will Ambulate: 25 feet; with minimal assist; with rolling walker   2:43 PM, 09/25/18 Lonell Grandchild, MPT Physical Therapist with [facility] [phone] office [phone] mobile phone

## 2018-09-26 LAB — CBC WITH DIFFERENTIAL/PLATELET
Abs Immature Granulocytes: 0.07 10*3/uL (ref 0.00–0.07)
BASOS ABS: 0.1 10*3/uL (ref 0.0–0.1)
Basophils Relative: 1 %
EOS ABS: 0.2 10*3/uL (ref 0.0–0.5)
EOS PCT: 2 %
HEMATOCRIT: 32.8 % — AB (ref 36.0–46.0)
Hemoglobin: 10.8 g/dL — ABNORMAL LOW (ref 12.0–15.0)
IMMATURE GRANULOCYTES: 1 %
Lymphocytes Relative: 27 %
Lymphs Abs: 2.4 10*3/uL (ref 0.7–4.0)
MCH: 32.4 pg (ref 26.0–34.0)
MCHC: 32.9 g/dL (ref 30.0–36.0)
MCV: 98.5 fL (ref 80.0–100.0)
Monocytes Absolute: 1 10*3/uL (ref 0.1–1.0)
Monocytes Relative: 11 %
NEUTROS PCT: 58 %
NRBC: 0 % (ref 0.0–0.2)
Neutro Abs: 5.1 10*3/uL (ref 1.7–7.7)
Platelets: 82 10*3/uL — ABNORMAL LOW (ref 150–400)
RBC: 3.33 MIL/uL — AB (ref 3.87–5.11)
RDW: 16.2 % — AB (ref 11.5–15.5)
WBC: 8.8 10*3/uL (ref 4.0–10.5)

## 2018-09-26 LAB — GLUCOSE, CAPILLARY
GLUCOSE-CAPILLARY: 167 mg/dL — AB (ref 70–99)
GLUCOSE-CAPILLARY: 174 mg/dL — AB (ref 70–99)
GLUCOSE-CAPILLARY: 232 mg/dL — AB (ref 70–99)
GLUCOSE-CAPILLARY: 240 mg/dL — AB (ref 70–99)
GLUCOSE-CAPILLARY: 282 mg/dL — AB (ref 70–99)
Glucose-Capillary: 142 mg/dL — ABNORMAL HIGH (ref 70–99)
Glucose-Capillary: 165 mg/dL — ABNORMAL HIGH (ref 70–99)

## 2018-09-26 LAB — COMPREHENSIVE METABOLIC PANEL
ALK PHOS: 125 U/L (ref 38–126)
ALT: 28 U/L (ref 0–44)
ANION GAP: 7 (ref 5–15)
AST: 71 U/L — ABNORMAL HIGH (ref 15–41)
Albumin: 2.3 g/dL — ABNORMAL LOW (ref 3.5–5.0)
BUN: 15 mg/dL (ref 8–23)
CALCIUM: 8 mg/dL — AB (ref 8.9–10.3)
CO2: 27 mmol/L (ref 22–32)
CREATININE: 1 mg/dL (ref 0.44–1.00)
Chloride: 97 mmol/L — ABNORMAL LOW (ref 98–111)
GFR, EST NON AFRICAN AMERICAN: 58 mL/min — AB (ref >60)
Glucose, Bld: 168 mg/dL — ABNORMAL HIGH (ref 70–99)
Potassium: 4.1 mmol/L (ref 3.5–5.1)
SODIUM: 131 mmol/L — AB (ref 135–145)
Total Bilirubin: 5.5 mg/dL — ABNORMAL HIGH (ref 0.3–1.2)
Total Protein: 5.1 g/dL — ABNORMAL LOW (ref 6.5–8.1)

## 2018-09-26 LAB — AMMONIA: AMMONIA: 37 umol/L — AB (ref 9–35)

## 2018-09-26 NOTE — Progress Notes (Signed)
PROGRESS NOTE  Denise Cuevas  YYT:[id number]  DOB: 20-Jul-1951  DOA: 09/21/2018 PCP: Dineen Kid, MD  Brief Admission Hx: Pt was admitted with severe NASH end stage liver disease with multiple recent admissions to [facility] for hepatic encephalopathy presented to ED with recurrent symptoms of encephalopathy.  Her confusion has been much worse in the past couple of days prior to admission.    MDM/Assessment & Plan:   1. Hepatic encephalopathy -appreciate GI consultation and excellent recommendations.  The patient has been having intermittent mental status changes with some brief periods of alertness followed by longer periods of lethargy.  GI prescribing oral and rectal lactulose. However she has not been able to retain recent lactulose enemas.  Ammonia levels have been fluctuating.  Continue supportive care.  GI has notified her transplant team at [facility].  Apparently, she is no longer a transplant candidate.  Palliative medicine met with the patient and her husband and they wish to continue current treatments at this time.  Her husband does report that her oral intake is improving. 2. Cirrhosis secondary to NASH - GI team restarting on oral medications which she has been able to take.   Continue on lactulose, Xifaxan and Linzess 3. GERD - protonix for GI protection.   4. AKI - likely secondary to acute urinary retention - GI team started gentle hydration and foley placed for relief of obstruction.  Creatinine has improved and is now normal range 5. Acute urinary retention - Foley catheter was placed 10/15 due to recurrent retention failing void trials. 6. Chronic systolic CHF- her home oral meds have been resumed.    DVT prophylaxis: SCDs Code Status: Full  Family Communication: husband Disposition Plan: Pending skilled nursing facility placement  Consultants:  GI  Palliative medicine  Subjective: She ate some breakfast this morning.  Denies any pain or shortness of  breath.  Objective: Vitals:   09/25/18 2056 09/25/18 2132 09/26/18 0406 09/26/18 1437  BP:  117/67 (!) 119/59 (!) 108/49  Pulse:  91 88 91  Resp:  _0 Temp:  98.2 F (36.8 C) 98 F (36.7 C) 98.2 F (36.8 C)  TempSrc:  Oral Oral Oral  SpO2: 97% 100% 97% 100%  Weight:   66.7 kg   Height:        Intake/Output Summary (Last 24 hours) at 09/26/2018 1838 Last data filed at 09/26/2018 1803 Gross per 24 hour  Intake 480 ml  Output 750 ml  Net -270 ml   Filed Weights   09/23/18 0500 09/25/18 0523 09/26/18 0406  Weight: 68.5 kg 68.8 kg 66.7 kg    Exam:  General exam: Alert, awake, no distress Respiratory system: Clear to auscultation. Respiratory effort normal. Cardiovascular system:RRR. No murmurs, rubs, gallops. Gastrointestinal system: Abdomen is nondistended, soft and nontender. No organomegaly or masses felt. Normal bowel sounds heard. Central nervous system: No focal neurological deficits. Extremities: No C/C/E, +pedal pulses Skin: No rashes, lesions or ulcers Psychiatry: She is more interactive, confused at times, but answering questions..   Data Reviewed: Basic Metabolic Panel: Recent Labs  Lab 09/22/18 0419 09/23/18 0442 09/24/18 0459 09/25/18 0459 09/26/18 0702  NA 134* 135 134* 133* 131*  K 3.7 3.3* 3.4* 3.8 4.1  CL 97* 96* 99 101 97*  CO2 _1 GLUCOSE 193* 146* 197* 199* 168*  BUN _2 CREATININE 1.37* 1.29* 1.07* 0.96 1.00  CALCIUM 8.4* 8.2* 8.2* 7.8* 8.0*  MG  --  2.2  2.2  --   --    Liver Function Tests: Recent Labs  Lab 09/22/18 0419 09/23/18 0442 09/24/18 0459 09/25/18 0459 09/26/18 0702  AST 45* 43* 54* 61* 71*  ALT 19 20 23 26 28  ALKPHOS 136* 130* 131* 127* 125  BILITOT 5.1* 5.2* 5.3* 5.5* 5.5*  PROT 5.2* 5.1* 5.1* 5.1* 5.1*  ALBUMIN 2.4* 2.3* 2.3* 2.3* 2.3*   No results for input(s): LIPASE, AMYLASE in the last 168 hours. Recent Labs  Lab 09/22/18 0420 09/23/18 0442 09/24/18 0459 09/25/18 0459  09/26/18 0702  AMMONIA 94* 56* 73* 60* 37*   CBC: Recent Labs  Lab 09/22/18 0419 09/23/18 0442 09/24/18 0459 09/25/18 0459 09/26/18 0702  WBC 7.8 6.8 7.1 9.1 8.8  NEUTROABS  --  3.9 4.3 5.1 5.1  HGB 10.5* 10.4* 10.6* 10.6* 10.8*  HCT 32.0* 32.4* 32.5* 32.3* 32.8*  MCV 95.2 99.4 98.2 96.4 98.5  PLT 84* 79* 76* 87* 82*   Cardiac Enzymes: No results for input(s): CKTOTAL, CKMB, CKMBINDEX, TROPONINI in the last 168 hours. CBG (last 3)  Recent Labs    09/26/18 0742 09/26/18 1109 09/26/18 1632  GLUCAP 167* 232* 240*   No results found for this or any previous visit (from the past 240 hour(s)).   Studies: No results found. Scheduled Meds: . DULoxetine  30 mg Oral Q lunch  . DULoxetine  60 mg Oral Q lunch  . feeding supplement (PRO-STAT SUGAR FREE 64)  30 mL Oral BID  . furosemide  40 mg Oral Daily  . Influenza vac split quadrivalent PF  0.5 mL Intramuscular Tomorrow-1000  . insulin aspart  0-9 Units Subcutaneous Q4H  . lactulose  20 g Oral QID  . levothyroxine  75 mcg Oral QAC breakfast  . linaclotide  145 mcg Oral QAC breakfast  . mouth rinse  15 mL Mouth Rinse BID  . pantoprazole  40 mg Oral Daily  . rifaximin  550 mg Oral BID  . sodium chloride flush  3 mL Intravenous Q12H  . spironolactone  100 mg Oral Daily   Continuous Infusions: . sodium chloride    . 0.9 % NaCl with KCl 20 mEq / L 50 mL/hr at 09/24/18 1938   Principal Problem:   Altered mental status Active Problems:   Type 2 diabetes mellitus, uncontrolled (HCC)   Hypothyroidism   Cirrhosis of liver with ascites (HCC)   Protein-calorie malnutrition, severe (HCC)   Acute hepatic encephalopathy   Hepatic encephalopathy (HCC)   Acute urinary retention   Goals of care, counseling/discussion   Palliative care by specialist   DNR (do not resuscitate) discussion   Hyperbilirubinemia  Time spent: 25mins  Jehanzeb Memon, MD [facility] Pager [phone]  If 7PM-7AM, please contact  night-coverage [email or web address] Password [email or web address] 09/26/2018, 6:38 PM    LOS: 4 days   

## 2018-09-26 NOTE — Progress Notes (Signed)
Patient ID: Denise Cuevas, female   DOB: July 16, 1951, 67 y.o.   MRN: [id number]   Assessment/Plan: ADMITTED WITH HEPATIC ENCEPHALOPATHY. CLINICALLY IMPROVED. CONTINUES WITH MILD LETHARGY.  PLAN:  1. CONTINUE XIFAXAN AND LACTULOSE. 2. PARACENTESIS ON MON OCT 21. 3. D/C DAILY LABS. NEXT LABS TUES OCT 22.      Subjective: Since I last evaluated the patient [place]. EATING MORE. FAMILY THINKING ABOUT DISPOSITION. PT RECOMMENDED 24 HR CARE/ THEY WILL DECIDE IF THEY CAN CARE FOR HER AT HOME OR IF SHE WILL GO TO A SNF.  Objective: Vital signs in last 24 hours: Vitals:   09/25/18 2132 09/26/18 0406  BP: 117/67 (!) 119/59  Pulse: 91 88  Resp: 18 18  Temp: 98.2 F (36.8 C) 98 F (36.7 C)  SpO2: 100% 97%   General appearance: alert, cooperative and no distress, SKIN/SCLEAR ICTERIC Resp: clear to auscultation bilaterally Cardio: regular rate and rhythm GI: soft, non-tender, DISTENDED; bowel sounds normal;  Lab Results:  K 4.1 T BILI 5.5 Hb 10.8   Studies/Results: No results found.  Medications: I have reviewed the patient's current medications.

## 2018-09-27 LAB — GLUCOSE, CAPILLARY
GLUCOSE-CAPILLARY: 149 mg/dL — AB (ref 70–99)
Glucose-Capillary: 160 mg/dL — ABNORMAL HIGH (ref 70–99)
Glucose-Capillary: 256 mg/dL — ABNORMAL HIGH (ref 70–99)
Glucose-Capillary: 295 mg/dL — ABNORMAL HIGH (ref 70–99)
Glucose-Capillary: 276 mg/dL — ABNORMAL HIGH (ref 70–99)

## 2018-09-27 MED ORDER — LACTULOSE ENEMA
300.0000 mL | Freq: Three times a day (TID) | ORAL | Status: DC
  Administered 2018-09-27: 300 mL via RECTAL
  Filled 2018-09-27 (×11): qty 300

## 2018-09-27 NOTE — Progress Notes (Addendum)
Patient ID: Denise Cuevas, female   DOB: 05-21-1951, 67 y.o.   MRN: [id number]   Assessment/Plan: ADMITTED WITH MS CHANGES/HEPATIC ENCEPHALOPATHY DUE TO DECOMPENSATED LIVER DISEASE-CHILD PUGH C(11)/MELD SCORE 21.  PLAN: 1. SUPPORTIVE CARE 2. RE-START LACTULOSE ENEMAS. 3. CONSIDER DISPOSITION WITHIN 24-48 HRS: HOME V. SNF. 4. PALLIATIVE CARE INPUT APPRECIATED. CONSIDERING POOR PROGNOSIS WE SHOULD DISCUSS PT BEING MADE A DNR. 5. CONTINUE LASIX/ALDACTONE, & LACTULOSE PO/XIFAXAN.   Subjective: Since I last evaluated the patient she IS LESS INTERACTIVE. HAD ONE LARGE BM YESTERDAY. SEEMS MORE SLEEPY TODAY @ TO HUSBAND. TOLERATING POs.  Objective: Vital signs in last 24 hours: Vitals:   09/26/18 2226 09/27/18 0700  BP: (!) 113/57 (!) 117/58  Pulse: 89 76  Resp: 15 18  Temp: 98.3 F (36.8 C) 98.7 F (37.1 C)  SpO2: 98% 96%   General appearance: icteric and no distress Resp: clear to auscultation bilaterally Cardio: regular rate and rhythm GI: soft, non-tender, MILDLY DISTENDED; bowel sounds normal;   Lab Results:  T BILI 5.5 ALB 2.3 Cr 1.00 Na 131 Hb 10.8   Studies/Results: No results found.  Medications: I have reviewed the patient's current medications.

## 2018-09-27 NOTE — Progress Notes (Signed)
Lactulose enema administered.  Patient was able to retain approx half of the dose for only about a minute or so, then all contents released - attempted to give more of the enema but all medication just leaking out immediately.  When entering room to administer enema, patent was found to have had a large BM already as well.

## 2018-09-27 NOTE — Progress Notes (Signed)
PROGRESS NOTE  Denise Cuevas  QSX:[id number]  DOB: 1951/06/03  DOA: 09/21/2018 PCP: Dineen Kid, MD  Brief Admission Hx: Pt was admitted with severe NASH end stage liver disease with multiple recent admissions to [facility] for hepatic encephalopathy presented to ED with recurrent symptoms of encephalopathy.  Her confusion has been much worse in the past couple of days prior to admission.    MDM/Assessment & Plan:   1. Hepatic encephalopathy -appreciate GI consultation and recommendations.  The patient has been having intermittent mental status changes with some brief periods of alertness followed by longer periods of lethargy.  GI prescribing oral and rectal lactulose. However she has not been able to retain recent lactulose enemas.  Ammonia levels have been fluctuating.  Continue supportive care.  GI has notified her transplant team at [facility].  Apparently, she is no longer a transplant candidate.  Palliative care has met with the patient and her husband to discuss goals of care.  I had a prolonged conversation with her husband today.  The patient did not appear to be alert enough to participate in conversation.  I reviewed her current state of health and her expected prognosis.  I recommend DO NOT RESUSCITATE status.  After a lengthy conversation, her husband does agree to DO NOT RESUSCITATE status.  I have also recommend possible hospice services.  Her husband will consider this. 2. Cirrhosis secondary to NASH - GI team restarting on oral medications which she has been able to take.   Continue on lactulose, Xifaxan and Linzess.  She is also on Lasix and Aldactone.  Plans are for possible paracentesis on 10/21 3. GERD - protonix for GI protection.   4. AKI - likely secondary to acute urinary retention - GI team started gentle hydration and foley placed for relief of obstruction.  Creatinine has improved and is now normal range 5. Acute urinary retention - Foley catheter was placed 10/15 due to recurrent  retention failing void trials. 6. Chronic systolic CHF- her home oral meds have been resumed.    DVT prophylaxis: SCDs Code Status: Full  Family Communication: husband Disposition Plan: Skilled nursing facility placement versus hospice  Consultants:  GI  Palliative medicine  Subjective: Speech is difficult to understand.  She is more lethargic today.  Husband says that she has been drinking liquids, but p.o. intake has been poor  Objective: Vitals:   09/26/18 1437 09/26/18 2226 09/27/18 0700 09/27/18 1539  BP: (!) 108/49 (!) 113/57 (!) 117/58 (!) 132/57  Pulse: 91 89 76 99  Resp: _0 Temp: 98.2 F (36.8 C) 98.3 F (36.8 C) 98.7 F (37.1 C) 98 F (36.7 C)  TempSrc: Oral Oral Oral   SpO2: 100% 98% 96% 98%  Weight:   69.4 kg   Height:        Intake/Output Summary (Last 24 hours) at 09/27/2018 1825 Last data filed at 09/27/2018 1600 Gross per 24 hour  Intake 847.42 ml  Output 350 ml  Net 497.42 ml   Filed Weights   09/25/18 0523 09/26/18 0406 09/27/18 0700  Weight: 68.8 kg 66.7 kg 69.4 kg    Exam:  General exam: Somnolent, no distress Respiratory system: Clear to auscultation. Respiratory effort normal. Cardiovascular system:RRR. No murmurs, rubs, gallops. Gastrointestinal system: Abdomen is distended, soft and nontender. No organomegaly or masses felt. Normal bowel sounds heard. Central nervous system: No focal neurological deficits. Extremities: No C/C/E, +pedal pulses Skin: No rashes, lesions or ulcers Psychiatry: She is more somnolent today,  less interactive.  She is confused and speech is incoherent at times  Data Reviewed: Basic Metabolic Panel: Recent Labs  Lab 09/22/18 0419 09/23/18 0442 09/24/18 0459 09/25/18 0459 09/26/18 0702  NA 134* 135 134* 133* 131*  K 3.7 3.3* 3.4* 3.8 4.1  CL 97* 96* 99 101 97*  CO2 _0 GLUCOSE 193* 146* 197* 199* 168*  BUN _1 CREATININE 1.37* 1.29* 1.07* 0.96 1.00  CALCIUM 8.4*  8.2* 8.2* 7.8* 8.0*  MG  --  2.2 2.2  --   --    Liver Function Tests: Recent Labs  Lab 09/22/18 0419 09/23/18 0442 09/24/18 0459 09/25/18 0459 09/26/18 0702  AST 45* 43* 54* 61* 71*  ALT _2 ALKPHOS 136* 130* 131* 127* 125  BILITOT 5.1* 5.2* 5.3* 5.5* 5.5*  PROT 5.2* 5.1* 5.1* 5.1* 5.1*  ALBUMIN 2.4* 2.3* 2.3* 2.3* 2.3*   No results for input(s): LIPASE, AMYLASE in the last 168 hours. Recent Labs  Lab 09/22/18 0420 09/23/18 0442 09/24/18 0459 09/25/18 0459 09/26/18 0702  AMMONIA 94* 56* 73* 60* 37*   CBC: Recent Labs  Lab 09/22/18 0419 09/23/18 0442 09/24/18 0459 09/25/18 0459 09/26/18 0702  WBC 7.8 6.8 7.1 9.1 8.8  NEUTROABS  --  3.9 4.3 5.1 5.1  HGB 10.5* 10.4* 10.6* 10.6* 10.8*  HCT 32.0* 32.4* 32.5* 32.3* 32.8*  MCV 95.2 99.4 98.2 96.4 98.5  PLT 84* 79* 76* 87* 82*   Cardiac Enzymes: No results for input(s): CKTOTAL, CKMB, CKMBINDEX, TROPONINI in the last 168 hours. CBG (last 3)  Recent Labs    09/27/18 0749 09/27/18 1159 09/27/18 1714  GLUCAP 160* 256* 295*   No results found for this or any previous visit (from the past 240 hour(s)).   Studies: No results found. Scheduled Meds: . DULoxetine  30 mg Oral Q lunch  . DULoxetine  60 mg Oral Q lunch  . feeding supplement (PRO-STAT SUGAR FREE 64)  30 mL Oral BID  . furosemide  40 mg Oral Daily  . Influenza vac split quadrivalent PF  0.5 mL Intramuscular Tomorrow-1000  . insulin aspart  0-9 Units Subcutaneous Q4H  . lactulose  20 g Oral QID  . lactulose  300 mL Rectal TID BM  . levothyroxine  75 mcg Oral QAC breakfast  . linaclotide  145 mcg Oral QAC breakfast  . mouth rinse  15 mL Mouth Rinse BID  . pantoprazole  40 mg Oral Daily  . rifaximin  550 mg Oral BID  . sodium chloride flush  3 mL Intravenous Q12H  . spironolactone  100 mg Oral Daily   Continuous Infusions: . sodium chloride     Principal Problem:   Altered mental status Active Problems:   Type 2 diabetes mellitus,  uncontrolled (HCC)   Hypothyroidism   Cirrhosis of liver with ascites (HCC)   Protein-calorie malnutrition, severe (HCC)   Acute hepatic encephalopathy   Hepatic encephalopathy ([place])   Acute urinary retention   Goals of care, counseling/discussion   Palliative care by specialist   DNR (do not resuscitate) discussion   Hyperbilirubinemia  Time spent: 35mns  JKathie Dike MD [facility] Pager [phone] If 7PM-7AM, please contact night-coverage [email or web address] Password [email or web address] 09/27/2018, 6:25 PM    LOS: 5 days

## 2018-09-28 ENCOUNTER — Inpatient Hospital Stay (HOSPITAL_COMMUNITY): Payer: Medicare Other

## 2018-09-28 ENCOUNTER — Encounter (HOSPITAL_COMMUNITY): Payer: Self-pay

## 2018-09-28 DIAGNOSIS — Z7189 Other specified counseling: Secondary | ICD-10-CM

## 2018-09-28 LAB — COMPREHENSIVE METABOLIC PANEL
ALBUMIN: 2.2 g/dL — AB (ref 3.5–5.0)
ALK PHOS: 134 U/L — AB (ref 38–126)
ALT: 32 U/L (ref 0–44)
AST: 83 U/L — ABNORMAL HIGH (ref 15–41)
Anion gap: 9 (ref 5–15)
BILIRUBIN TOTAL: 5 mg/dL — AB (ref 0.3–1.2)
BUN: 18 mg/dL (ref 8–23)
CALCIUM: 8.1 mg/dL — AB (ref 8.9–10.3)
CO2: 23 mmol/L (ref 22–32)
CREATININE: 1.06 mg/dL — AB (ref 0.44–1.00)
Chloride: 99 mmol/L (ref 98–111)
GFR calc Af Amer: >60 mL/min (ref >60)
GFR calc non Af Amer: 54 mL/min — ABNORMAL LOW (ref >60)
GLUCOSE: 183 mg/dL — AB (ref 70–99)
Potassium: 3.6 mmol/L (ref 3.5–5.1)
SODIUM: 131 mmol/L — AB (ref 135–145)
TOTAL PROTEIN: 5.1 g/dL — AB (ref 6.5–8.1)

## 2018-09-28 LAB — GRAM STAIN

## 2018-09-28 LAB — GLUCOSE, CAPILLARY
Glucose-Capillary: 193 mg/dL — ABNORMAL HIGH (ref 70–99)
Glucose-Capillary: 194 mg/dL — ABNORMAL HIGH (ref 70–99)
Glucose-Capillary: 174 mg/dL — ABNORMAL HIGH (ref 70–99)
Glucose-Capillary: 243 mg/dL — ABNORMAL HIGH (ref 70–99)
Glucose-Capillary: 204 mg/dL — ABNORMAL HIGH (ref 70–99)
Glucose-Capillary: 223 mg/dL — ABNORMAL HIGH (ref 70–99)

## 2018-09-28 LAB — BODY FLUID CELL COUNT WITH DIFFERENTIAL
EOS FL: 0 %
Lymphs, Fluid: 93 %
Monocyte-Macrophage-Serous Fluid: 6 % — ABNORMAL LOW (ref 50–90)
NEUTROPHIL FLUID: 1 % (ref 0–25)
OTHER CELLS FL: 1 %
WBC FLUID: 100 uL (ref 0–1000)

## 2018-09-28 MED ORDER — PANTOPRAZOLE SODIUM 40 MG PO TBEC
40.0000 mg | DELAYED_RELEASE_TABLET | Freq: Every day | ORAL | Status: DC
  Administered 2018-09-28 – 2018-09-29 (×2): 40 mg via ORAL
  Filled 2018-09-28 (×2): qty 1

## 2018-09-28 NOTE — Progress Notes (Addendum)
    Subjective: Alert to person only, slowed response. Denies abdominal pain, N/V. Ate small amount of breakfast with assistance from nurse tech. Wants toes out from under blanket. Believes it is 2017. No family at bedside. 1 BM yesterday per nursing staff, 1 BM today although not charted yet in epic per nursing staff.   Objective: Vital signs in last 24 hours: Temp:  [97.6 F (36.4 C)-99.2 F (37.3 C)] 97.6 F (36.4 C) (10/21 0500) Pulse Rate:  [80-99] 80 (10/21 0500) Resp:  [14-18] 18 (10/21 0500) BP: (114-132)/(57-74) 114/74 (10/21 0500) SpO2:  [98 %-100 %] 100 % (10/21 0500) Weight:  [69.5 kg-70.2 kg] 70.2 kg (10/21 0544) Last BM Date: 09/27/18 General:   Flat affect, jaundiced, slowed responses. Does not keep eye contact. Oriented to person only and name.  Head:  Normocephalic and atraumatic. Eyes:  +scleral icterus  Lungs: scattered rhonchi  Abdomen:  Bowel sounds present, soft but distended, particularly upper abdomen, no rebound or guarding Extremities:  Without  edema.  Intake/Output from previous day: 10/20 0701 - 10/21 0700 In: 500 [P.O.:500] Out: 525 [Urine:525] Intake/Output this shift: No intake/output data recorded.  Lab Results: Recent Labs    09/26/18 0702  WBC 8.8  HGB 10.8*  HCT 32.8*  PLT 82*   BMET Recent Labs    09/26/18 0702 09/28/18 0623  NA 131* 131*  K 4.1 3.6  CL 97* 99  CO2 27 23  GLUCOSE 168* 183*  BUN 15 18  CREATININE 1.00 1.06*  CALCIUM 8.0* 8.1*   LFT Recent Labs    09/26/18 0702 09/28/18 0623  PROT 5.1* 5.1*  ALBUMIN 2.3* 2.2*  AST 71* 83*  ALT 28 32  ALKPHOS 125 134*  BILITOT 5.5* 5.0*    Assessment: 67 year old female admitted with hepatic encephalopathy secondary to decompensated liver disease. Not a transplant candidate. She is now a DNR. Poor prognosis. More alert this morning, answering slowly with one word responses. Oriented to person only. Unable to retain lactulose enema yesterday evening.    Plan: Continue oral lactulose and Xifaxan Continue lactulose enemas if tolerated Continue Linzess Protonix daily [place] para today for therapeutic purposes: will ensure Albumin IV is given  Check CBC, CMP, INR on 10/22 Supportive care Anticipate SNF or hospice   Annitta Needs, PhD, ANP-BC [facility]     LOS: 6 days    09/28/2018, 8:44 AM

## 2018-09-28 NOTE — Progress Notes (Signed)
Daily Progress Note   Patient Name: Denise Cuevas       Date: 09/28/2018 DOB: 09-Feb-1951  Age: 67 y.o. MRN#: [id number] Attending Physician: Kathie Dike, MD Primary Care Physician: Dineen Kid, MD Admit Date: 09/21/2018  Reason for Consultation/Follow-up: Establishing goals of care  Subjective: Patient in bed, opens eyes, states she feels "out of it". Reports abdominal pain. RN at bedside- states patient has not received pain medication. Discussed continued aggressive medical care vs transition to comfort care. Husband states patient keeps saying she wants to go home and his greatest hope would be to get her home.  Discussed possible Hospice and [place] with spouse. He understands that patient has end stage illness. Is uncertain of GOC and wishes to try and get spouse alert enough to discuss with her. He has clear understanding of Hospice philosophy and does feel that patient would best be served by going home with Hospice services. He requests Hospice agency come to discuss services with him.   Review of Systems  Unable to perform ROS: Mental acuity    Length of Stay: 6  Current Medications: Scheduled Meds:  . DULoxetine  30 mg Oral Q lunch  . DULoxetine  60 mg Oral Q lunch  . feeding supplement (PRO-STAT SUGAR FREE 64)  30 mL Oral BID  . furosemide  40 mg Oral Daily  . Influenza vac split quadrivalent PF  0.5 mL Intramuscular Tomorrow-1000  . insulin aspart  0-9 Units Subcutaneous Q4H  . lactulose  20 g Oral QID  . lactulose  300 mL Rectal TID BM  . levothyroxine  75 mcg Oral QAC breakfast  . linaclotide  145 mcg Oral QAC breakfast  . mouth rinse  15 mL Mouth Rinse BID  . pantoprazole  40 mg Oral Daily  . rifaximin  550 mg Oral BID  . sodium chloride flush  3 mL Intravenous  Q12H  . spironolactone  100 mg Oral Daily    Continuous Infusions: . sodium chloride      PRN Meds: sodium chloride, albuterol, ondansetron (ZOFRAN) IV, sodium chloride flush  Physical Exam  Constitutional:  cachetic  Pulmonary/Chest: Effort normal.  Abdominal: She exhibits distension.  Musculoskeletal:  Temporal muscle wasting noted  Neurological:  lethargic  Skin:  Jaundice, scattered petichiae  Nursing note and vitals reviewed.  Vital Signs: BP 114/74 (BP Location: Left Arm)   Pulse 80   Temp 97.6 F (36.4 C) (Oral)   Resp 18   Ht 5' (1.524 m)   Wt 70.2 kg   LMP  (LMP Unknown)   SpO2 100%   BMI 30.23 kg/m  SpO2: SpO2: 100 % O2 Device: O2 Device: Room Air O2 Flow Rate:    Intake/output summary:   Intake/Output Summary (Last 24 hours) at 09/28/2018 1148 Last data filed at 09/28/2018 0948 Gross per 24 hour  Intake 243 ml  Output 525 ml  Net -282 ml   LBM: Last BM Date: 09/27/18 Baseline Weight: Weight: 83 kg Most recent weight: Weight: 70.2 kg       Palliative Assessment/Data: PPS: 20%    Flowsheet Rows     Most Recent Value  Intake Tab  Referral Department  Hospitalist  Unit at Time of Referral  Cardiac/Telemetry Unit  Palliative Care Primary Diagnosis  Other (Comment) [GI/NASH]  Date Notified  09/22/18  Palliative Care Type  New Palliative care  Reason for referral  Clarify Goals of Care  Date of Admission  09/21/18  Date first seen by Palliative Care  09/23/18  # of days Palliative referral response time  1 Day(s)  # of days IP prior to Palliative referral  1  Clinical Assessment  Palliative Performance Scale Score  30%  Pain Max last 24 hours  Not able to report  Pain Min Last 24 hours  Not able to report  Dyspnea Max Last 24 Hours  Not able to report  Dyspnea Min Last 24 hours  Not able to report  Psychosocial & Spiritual Assessment  Palliative Care Outcomes      Patient Active Problem List   Diagnosis Date Noted  .  Hyperbilirubinemia   . DNR (do not resuscitate) discussion   . Goals of care, counseling/discussion   . Palliative care by specialist   . Hepatic encephalopathy ([place]) 09/22/2018  . Acute urinary retention 09/22/2018  . Altered mental status 09/21/2018  . Anemia 07/09/2018  . Thrombocytopenia ([place]) 07/09/2018  . HCAP (healthcare-associated pneumonia) 07/09/2018  . AKI (acute kidney injury) ([place]) 07/09/2018  . Lactic acidosis 07/09/2018  . Acute hepatic encephalopathy 12/17/2017  . Atrial fibrillation ([place]) [I48.91] 11/28/2017  . Hospital discharge follow-up 10/30/2017  . Nonrheumatic aortic valve stenosis   . Chronic bronchitis ([place])   . Other chest pain 10/17/2017  . Peripheral edema   . Unstable angina ([place]) 10/15/2017  . Asthma, cough variant   . Cirrhosis of liver with ascites ([place]) 11/20/2016  . Pancytopenia ([place]) 11/20/2016  . Protein-calorie malnutrition, severe ([place]) 11/20/2016  . Acute diastolic CHF (congestive heart failure) ([place])   . Congestive heart failure ([place]) 11/19/2016  . Chest pain 05/29/2015  . Memory loss 05/29/2015  . Essential hypertension 03/20/2015  . Dyspnea 02/02/2015  . Acute respiratory distress 02/02/2015  . Depression   . GERD (gastroesophageal reflux disease)   . Unstable angina pectoris ([place]) 11/25/2011  . Hypothyroidism   . CAD (coronary artery disease)   . Other and unspecified angina pectoris 11/19/2011  . Type 2 diabetes mellitus, uncontrolled ([place]) 11/19/2011  . Myocardial infarction [facility]) 11/09/2011    Palliative Care Assessment & Plan   Patient Profile: 67 y.o. female  with past medical history of hypertension, hyperlipidemia, Dm2 hypothyroidism, CAD s/p CABG, CHF, (EF ), Hepatic cirrhosis admitted on 09/21/2018 with hepatic encephalopathy.  Assessment/Recommendations/Plan   Care manager consult to have Hospice just to discuss their service-  patient's spouse not certain that is his choice of path  Frequent pain  assessment   Code Status:  DNR  Prognosis:   < 6 months  Discharge Planning:  To Be Determined  Care plan was discussed with patient's spouse- Yvone Neu.  Thank you for allowing the Palliative Medicine Team to assist in the care of this patient.   Time In: 1120 Time Out: 1200 Total Time 40 mins Prolonged Time Billed no      Greater than 50%  of this time was spent counseling and coordinating care related to the above assessment and plan.  Mariana Kaufman, AGNP-C Palliative Medicine   Please contact Palliative Medicine Team phone at [phone] for questions and concerns.

## 2018-09-28 NOTE — Care Management (Addendum)
CM contacted pt's husband to discuss hospice services in the home. Per husband he wants to take pt home when she is ready for discharge, he never expected her to stay in the hospital this long, she beds to be taken home every time she wakes up. CM discussed differences between hospice services and home health services. Husband would like hospice referral sent to [place] after discussion of choices. Husband aware pt will no longer be able to get PT at home. Will need hospital bed. definitive DC date unknown at this time, pt to receive a paracentesis today. Mentation still fluctuating. Will cont to follow.

## 2018-09-28 NOTE — Progress Notes (Signed)
PROGRESS NOTE  Denise Cuevas  STM:[id number]  DOB: June 03, 1951  DOA: 09/21/2018 PCP: Dineen Kid, MD  Brief Admission Hx: Pt was admitted with severe NASH end stage liver disease with multiple recent admissions to [facility] for hepatic encephalopathy presented to ED with recurrent symptoms of encephalopathy.  Her confusion has been much worse in the past couple of days prior to admission.    MDM/Assessment & Plan:   1. Hepatic encephalopathy -appreciate GI consultation and recommendations.  The patient has been having intermittent mental status changes with some brief periods of alertness followed by longer periods of lethargy.  GI prescribing oral and rectal lactulose. However she has not been able to retain recent lactulose enemas.  Ammonia levels have been fluctuating.  Continue supportive care.  GI has notified her transplant team at [facility].  Apparently, she is no longer a transplant candidate.  Palliative care has met with the patient and her husband to discuss goals of care.  I had a prolonged conversation with her husband on 10/20.  The patient did not appear to be alert enough to participate in conversation.  I reviewed her current state of health with him and her expected prognosis.  I recommend DO NOT RESUSCITATE status.  After a lengthy conversation, her husband does agree to DO NOT RESUSCITATE status.  I have also recommend possible hospice services.  Her husband says that they will meet with hospice tomorrow to discuss this further 2. Cirrhosis secondary to NASH - GI team restarting on oral medications which she has been able to take.   Continue on lactulose, Xifaxan and Linzess.  She is also on Lasix and Aldactone.  Paracentesis done on 10/21 with removal of 3.9 L of fluid 3. GERD - protonix for GI protection.   4. AKI - likely secondary to acute urinary retention - GI team started gentle hydration and foley placed for relief of obstruction.  Creatinine has improved and is now normal  range 5. Acute urinary retention - Foley catheter was placed 10/15 due to recurrent retention failing void trials. 6. Chronic systolic CHF- her home oral meds have been resumed.    DVT prophylaxis: SCDs Code Status: Full  Family Communication: Brief conversation with patient's husband over the phone.  He describes that he will meet with hospice tomorrow.  He also describes that he initiated this conversation with the patient.  Unfortunately, we could not going to further details since his cell phone died.  We will try and reconnect tomorrow. Disposition Plan: Skilled nursing facility placement versus hospice  Consultants:  GI  Palliative medicine  Subjective: She says she had a conversation with her husband today, but does not go into further detail.  She states that she has not had a bowel movement today.  Objective: Vitals:   09/28/18 0500 09/28/18 0544 09/28/18 1345 09/28/18 1539  BP: 114/74  117/64 (!) 109/55  Pulse: 80  88 91  Resp: 18  20 16   Temp: 97.6 F (36.4 C)   97.8 F (36.6 C)  TempSrc: Oral   Oral  SpO2: 100%  99% 100%  Weight: 69.5 kg 70.2 kg    Height:        Intake/Output Summary (Last 24 hours) at 09/28/2018 1922 Last data filed at 09/28/2018 1700 Gross per 24 hour  Intake 723 ml  Output 300 ml  Net 423 ml   Filed Weights   09/27/18 0700 09/28/18 0500 09/28/18 0544  Weight: 69.4 kg 69.5 kg 70.2 kg    Exam:  General exam: Awake, no distress Respiratory system: Clear to auscultation. Respiratory effort normal. Cardiovascular system:RRR. No murmurs, rubs, gallops. Gastrointestinal system: Abdomen is distended, soft and mildly tender throughout abdomen. No organomegaly or masses felt. Normal bowel sounds heard. Central nervous system: No focal neurological deficits. Extremities: No C/C/E, +pedal pulses Skin: No rashes, lesions or ulcers Psychiatry: She is awake, slow to respond  Data Reviewed: Basic Metabolic Panel: Recent Labs  Lab  09/23/18 0442 09/24/18 0459 09/25/18 0459 09/26/18 0702 09/28/18 0623  NA 135 134* 133* 131* 131*  K 3.3* 3.4* 3.8 4.1 3.6  CL 96* 99 101 97* 99  CO2 30 25 28 27 23   GLUCOSE 146* 197* 199* 168* 183*  BUN 21 19 15 15 18   CREATININE 1.29* 1.07* 0.96 1.00 1.06*  CALCIUM 8.2* 8.2* 7.8* 8.0* 8.1*  MG 2.2 2.2  --   --   --    Liver Function Tests: Recent Labs  Lab 09/23/18 0442 09/24/18 0459 09/25/18 0459 09/26/18 0702 09/28/18 0623  AST 43* 54* 61* 71* 83*  ALT 20 23 26 28  32  ALKPHOS 130* 131* 127* 125 134*  BILITOT 5.2* 5.3* 5.5* 5.5* 5.0*  PROT 5.1* 5.1* 5.1* 5.1* 5.1*  ALBUMIN 2.3* 2.3* 2.3* 2.3* 2.2*   No results for input(s): LIPASE, AMYLASE in the last 168 hours. Recent Labs  Lab 09/22/18 0420 09/23/18 0442 09/24/18 0459 09/25/18 0459 09/26/18 0702  AMMONIA 94* 56* 73* 60* 37*   CBC: Recent Labs  Lab 09/22/18 0419 09/23/18 0442 09/24/18 0459 09/25/18 0459 09/26/18 0702  WBC 7.8 6.8 7.1 9.1 8.8  NEUTROABS  --  3.9 4.3 5.1 5.1  HGB 10.5* 10.4* 10.6* 10.6* 10.8*  HCT 32.0* 32.4* 32.5* 32.3* 32.8*  MCV 95.2 99.4 98.2 96.4 98.5  PLT 84* 79* 76* 87* 82*   Cardiac Enzymes: No results for input(s): CKTOTAL, CKMB, CKMBINDEX, TROPONINI in the last 168 hours. CBG (last 3)  Recent Labs    09/28/18 0728 09/28/18 1110 09/28/18 1630  GLUCAP 174* 243* 204*   Recent Results (from the past 240 hour(s))  Gram stain     Status: None   Collection Time: 09/28/18  2:18 PM  Result Value Ref Range Status   Specimen Description ASCITIC  Final   Special Requests 10CC  Final   Gram Stain   Final    CYTOSPIN SMEAR NO ORGANISMS SEEN WBC PRESENT, PREDOMINANTLY MONONUCLEAR Performed at [facility], [name], [place], [place] [address]    Report Status 09/28/2018 FINAL  Final     Studies: US Paracentesis  Result Date: 09/28/2018 INDICATION: Idiopathic cirrhosis, ascites, CHF, COPD, diabetes mellitus, hypertension EXAM: ULTRASOUND GUIDED DIAGNOSTIC AND  THERAPEUTIC PARACENTESIS MEDICATIONS: None. COMPLICATIONS: None immediate. PROCEDURE: Procedure, benefits, and risks of procedure were discussed with patient. Written informed consent for procedure was obtained. Time out protocol followed. Adequate collection of ascites localized by ultrasound in RIGHT lower quadrant. Skin prepped and draped in usual sterile fashion. Skin and soft tissues anesthetized with 10 mL of 1% lidocaine. 5 [place] Yueh catheter placed into peritoneal cavity. 3.9 L of yellow ascitic fluid fluid aspirated by vacuum bottle suction. Procedure tolerated well by patient without immediate complication. FINDINGS: A total of approximately 3.9 L of ascitic fluid was removed. Samples were sent to the laboratory as requested by the clinical team. IMPRESSION: Successful ultrasound-guided paracentesis yielding 3.9 liters of peritoneal fluid. Electronically Signed   By: Lavonia Dana M.D.   On: 09/28/2018 15:18   Scheduled Meds: . DULoxetine  30 mg Oral Q lunch  . DULoxetine  60 mg Oral Q lunch  . feeding supplement (PRO-STAT SUGAR FREE 64)  30 mL Oral BID  . furosemide  40 mg Oral Daily  . Influenza vac split quadrivalent PF  0.5 mL Intramuscular Tomorrow-1000  . insulin aspart  0-9 Units Subcutaneous Q4H  . lactulose  20 g Oral QID  . lactulose  300 mL Rectal TID BM  . levothyroxine  75 mcg Oral QAC breakfast  . linaclotide  145 mcg Oral QAC breakfast  . mouth rinse  15 mL Mouth Rinse BID  . pantoprazole  40 mg Oral Daily  . rifaximin  550 mg Oral BID  . sodium chloride flush  3 mL Intravenous Q12H  . spironolactone  100 mg Oral Daily   Continuous Infusions: . sodium chloride     Principal Problem:   Altered mental status Active Problems:   Type 2 diabetes mellitus, uncontrolled (HCC)   Hypothyroidism   Cirrhosis of liver with ascites (HCC)   Protein-calorie malnutrition, severe (HCC)   Acute hepatic encephalopathy   Hepatic encephalopathy ([place])   Acute urinary retention    Goals of care, counseling/discussion   Palliative care by specialist   DNR (do not resuscitate) discussion   Hyperbilirubinemia   Advanced care planning/counseling discussion  Time spent: 19mns  JKathie Dike MD [facility] Pager [phone] If 7PM-7AM, please contact night-coverage [email or web address] Password [email or web address] 09/28/2018, 7:22 PM    LOS: 6 days

## 2018-09-28 NOTE — Procedures (Signed)
PreOperative Dx: Cirrhosis, ascites Postoperative Dx: Cirrhosis, ascites Procedure:   US guided paracentesis Radiologist:  Danita Proud Anesthesia:  10 ml of1% lidocaine Specimen:  3.9 L of yellow ascitic fluid EBL:   < 1 ml Complications: None  

## 2018-09-28 NOTE — Care Management Important Message (Signed)
Important Message  Patient Details  Name: Denise Cuevas MRN: [id number] Date of Birth: 22-Mar-1951   Medicare Important Message Given:  Yes    Shelda Altes 09/28/2018, 12:55 PM

## 2018-09-28 NOTE — Care Management (Signed)
Patient Information  SS # [id number]  Patient Name Denise Cuevas, Denise Cuevas ([id number]) Sex Female DOB 1951-08-11  Room Bed  A316 A316-01  Patient Demographics   Address [place] [address] Phone [phone] (Home) *[phone] (Mobile) E-mail Address [email or web address]  Patient Ethnicity & Race   Ethnic Group Patient Race  Not Hispanic or Latino White or Caucasian  Emergency Contact(s)   Name Relation Home Work Mobile  Cuevas Spouse [phone]  [phone]  Denise, [name]  [phone]  Denise, Cuevas Relative [phone]    Documents on File    Status Date Received Description  Documents for the Patient  [place] Received 03/31/17 [place] E-Signature HIPAA Notice of Privacy Received 02/26/13   [place] E-Signature HIPAA Notice of Privacy Spanish Not Received    Driver's License Not Received    Advance Directives/Living Will/HCPOA/POA Not Received    Financial Application Not Received    Insurance Card Not Received    [place] HIPAA NOTICE OF PRIVACY - Scanned Not Received    Release of Information Not Received    Release of Information Not Received    Release of Information Not Received    [place] Not Received    Insurance Card Not Received    Release of Information Received 12/26/11 Cardiac Rehab Informed Consent  Insurance Card Received 05/19/18 [employer]  Insurance Card Not Received    [place] HIPAA NOTICE OF PRIVACY - Scanned Received 05/19/18 [employer]  Insurance Card Not Received    [place] HIPAA NOTICE OF PRIVACY - Scanned Received 02/19/12 315  Release of Information Received 03/25/12 AUTH LHC 03/24/12 INPT NSTEMI  Release of Information Not Received    Insurance Card Not Received    Insurance Card Not Received    Insurance Card Received 08/17/12 BCBS STATE ISS. 06/08/12/UHC SPOUSE  Release of Information  Not Received    Advanced Beneficiary Notice (ABN) Not Received    [place] E-Signature HIPAA Notice of Privacy Received 10/15/13   Insurance Card Received 08/24/14 BCBS/UHC 15  Insurance Card Received 10/15/13 bcbs/uhc  Driver's License Not Received    Driver's License Received 27/78/24 BCG  Insurance Card Received 12/07/13 BCG  Other Photo ID Not Received    Insurance Card Received 04/20/15 UHC Choice/BCBS State Health 2016  [place] HIPAA NOTICE OF PRIVACY - Scanned Received 05/26/15 [facility]  Insurance Card Received 05/26/15 [facility]  Insurance Card Received 07/21/15 BCG  Insurance Card Received 09/03/16 [facility]  [place] HIPAA NOTICE OF PRIVACY - Scanned Received 05/19/18 [id number]  [place] HIPAA NOTICE OF PRIVACY - Scanned Received 09/03/16 wmc  Insurance Card Received 10/23/16 bcg  Insurance Card Received 03/31/17 [facility] MCARE/UHC/  Driver's License Not Received  NCDL/WMC  Release of Information   DPR [facility] 12/23/16  E-Signature AOB Spanish Not Received    Insurance Card   [employer] UHC 2018  HIM ROI Authorization  03/04/17 NO [place] E-Signature HIPAA Notice of Privacy Signed 03/10/17 [facility] ED 03/2017  HIM ROI Authorization (Expired) 04/03/17 Authorization for batch [id number]  Insurance Card Received 07/30/17 tfc-uhc medicare/uhc secondary 2018  Insurance Card Received 10/10/17 [place] Card Received 10/29/17 uhc secondary ins  AMB Correspondence Received 11/04/17 LETTER [facility]  AMB HH/NH/Hospice Received 43/15/40 CERTIFICATION&POC [facility]  AMB HH/NH/Hospice Received 12/12/17 [place] Card Received 12/23/17 NO  NEW CARDS  AMB HH/NH/Hospice Received 12/25/17 ORDER [facility]  AMB HH/NH/Hospice Received 12/31/17 ORDER [facility]  [place] HIPAA NOTICE OF PRIVACY - Scanned Accepted 01/01/18 [place]  AMB HH/NH/Hospice Received 01/01/18 ORDER [facility]  AMB  HH/NH/Hospice Received 01/07/18 1/16 ORDERS [facility]  AMB Correspondence Received 01/11/18 IMPLANTED DEVICE [employer]  AMB HH/NH/Hospice Received 01/20/18 12/18-1/19 ORDER [facility]  Insurance Card     Insurance Card Received 02/02/18 tfac- uhc/uhc 2019  HIM ROI Authorization  02/11/18 UHC Denial  Insurance Card   New Medicare ID 2019  AMB HH/NH/Hospice Received 12/12/17 ORDERS [facility]  HIM ROI Authorization  03/27/18 [facility]  HIM ROI Authorization  03/27/18 [place]  HIM ROI Authorization  03/30/18 [facility]  Release of Information Received 05/29/18 consent  HIM ROI Authorization (Expired) 06/04/18 Authorization for batch CIOX/UnitedHealthCare 2019 HEDIS Review  Insurance Card Received (Deleted) 12/24/11 [facility]  HIM Release of Information Output  02/05/18 Medication Administration Record, Notes Individual, Progress Note, Lab Reports, Operative and Procedural Report  HIM Release of Information Output  02/11/18 Template Released Report  HIM Release of Information Output (Deleted) 03/27/18 Requested records  HIM Release of Information Output (Deleted) 03/27/18 Requested records  HIM Release of Information Output (Deleted) 03/30/18 Requested records  HIM Release of Information Output  06/05/18 Requested records  Documents for the Encounter  AOB (Assignment of Insurance Benefits) Not Received    E-signature AOB Signed 09/21/18   MEDICARE RIGHTS Not Received    E-signature Medicare Rights Signed 09/21/18   ED Patient Billing Extract   ED PB Billing Extract  Cardiac Monitoring Strip Shift Summary Received 09/21/18   Cardiac Monitoring Strip Received 09/23/18   EKG Received 09/22/18   Admission Information   Attending Provider Admitting Provider Admission Type Admission Date/Time  Kathie Dike, MD Jani Gravel, MD Emergency 09/21/18 1149  Discharge Date Hospital Service Auth/Cert Status Service  Area   Acute Care Incomplete [facility]  Unit Room/Bed Admission Status   AP-DEPT 300 A316/A316-01 Admission (Confirmed)   Admission   Complaint  --  Hospital Account   Name Acct ID Class Status Primary Coverage  Denise Cuevas, [place] [id number] Inpatient [place]      Guarantor Account (for Hospital Account [id number])   Name Relation to Pt Service Area Active? Acct Type  Laruth Bouchard Self CHSA Yes Personal/Family  Address Phone    [place], [place] [address] [phone])        Coverage Information (for Hospital Account [id number])   F/O Payor/Plan Precert #  [facility] [place] #  [place], [place] [id number]  [place] [address] [phone]

## 2018-09-29 LAB — COMPREHENSIVE METABOLIC PANEL
ALK PHOS: 143 U/L — AB (ref 38–126)
ALT: 31 U/L (ref 0–44)
ANION GAP: 10 (ref 5–15)
AST: 77 U/L — ABNORMAL HIGH (ref 15–41)
Albumin: 2.1 g/dL — ABNORMAL LOW (ref 3.5–5.0)
BILIRUBIN TOTAL: 4.4 mg/dL — AB (ref 0.3–1.2)
BUN: 18 mg/dL (ref 8–23)
CALCIUM: 7.7 mg/dL — AB (ref 8.9–10.3)
CO2: 20 mmol/L — ABNORMAL LOW (ref 22–32)
Chloride: 97 mmol/L — ABNORMAL LOW (ref 98–111)
Creatinine, Ser: 0.96 mg/dL (ref 0.44–1.00)
Glucose, Bld: 179 mg/dL — ABNORMAL HIGH (ref 70–99)
Potassium: 3.5 mmol/L (ref 3.5–5.1)
Sodium: 127 mmol/L — ABNORMAL LOW (ref 135–145)
TOTAL PROTEIN: 4.9 g/dL — AB (ref 6.5–8.1)

## 2018-09-29 LAB — GLUCOSE, CAPILLARY
GLUCOSE-CAPILLARY: 202 mg/dL — AB (ref 70–99)
GLUCOSE-CAPILLARY: 231 mg/dL — AB (ref 70–99)
Glucose-Capillary: 185 mg/dL — ABNORMAL HIGH (ref 70–99)
Glucose-Capillary: 165 mg/dL — ABNORMAL HIGH (ref 70–99)
Glucose-Capillary: 267 mg/dL — ABNORMAL HIGH (ref 70–99)

## 2018-09-29 LAB — CBC WITH DIFFERENTIAL/PLATELET
Abs Immature Granulocytes: 0.11 10*3/uL — ABNORMAL HIGH (ref 0.00–0.07)
Basophils Absolute: 0.1 10*3/uL (ref 0.0–0.1)
Basophils Relative: 1 %
EOS PCT: 2 %
Eosinophils Absolute: 0.2 10*3/uL (ref 0.0–0.5)
HCT: 33 % — ABNORMAL LOW (ref 36.0–46.0)
HEMOGLOBIN: 10.9 g/dL — AB (ref 12.0–15.0)
Immature Granulocytes: 1 %
LYMPHS PCT: 25 %
Lymphs Abs: 2.5 10*3/uL (ref 0.7–4.0)
MCH: 32.1 pg (ref 26.0–34.0)
MCHC: 33 g/dL (ref 30.0–36.0)
MCV: 97.1 fL (ref 80.0–100.0)
MONO ABS: 1.4 10*3/uL — AB (ref 0.1–1.0)
Monocytes Relative: 14 %
Neutro Abs: 5.6 10*3/uL (ref 1.7–7.7)
Neutrophils Relative %: 57 %
Platelets: 82 10*3/uL — ABNORMAL LOW (ref 150–400)
RBC: 3.4 MIL/uL — AB (ref 3.87–5.11)
RDW: 16.9 % — ABNORMAL HIGH (ref 11.5–15.5)
WBC: 9.9 10*3/uL (ref 4.0–10.5)
nRBC: 0 % (ref 0.0–0.2)

## 2018-09-29 LAB — PROTIME-INR
INR: 1.51
PROTHROMBIN TIME: 18.1 s — AB (ref 11.4–15.2)

## 2018-09-29 MED ORDER — FUROSEMIDE 40 MG PO TABS: 40.0000 mg | ORAL_TABLET | Freq: Every day | ORAL | 0 refills | Status: AC

## 2018-09-29 MED ORDER — HUMALOG MIX 75/25 KWIKPEN (75-25) 100 UNIT/ML ~~LOC~~ SUPN: 10.0000 [IU] | PEN_INJECTOR | Freq: Two times a day (BID) | SUBCUTANEOUS | 10 refills | Status: AC

## 2018-09-29 MED ORDER — SPIRONOLACTONE 100 MG PO TABS: 100.0000 mg | ORAL_TABLET | Freq: Every day | ORAL | 0 refills | Status: AC

## 2018-09-29 MED ORDER — XIFAXAN 550 MG PO TABS: 550.0000 mg | ORAL_TABLET | Freq: Two times a day (BID) | ORAL | 5 refills | Status: AC

## 2018-09-29 MED ORDER — LINACLOTIDE 145 MCG PO CAPS: 145.0000 ug | ORAL_CAPSULE | Freq: Every day | ORAL | 0 refills | Status: AC

## 2018-09-29 MED ORDER — LACTULOSE 10 GM/15ML PO SOLN: 20.0000 g | Freq: Four times a day (QID) | ORAL | 0 refills | Status: AC

## 2018-09-29 NOTE — Discharge Summary (Signed)
Physician Discharge Summary  Denise Cuevas OEU:[id number] DOB: [birth year] DOA: 09/21/2018  PCP: Dineen Kid, MD  Admit date: 09/21/2018 Discharge date: 09/29/2018  Admitted From: home Disposition:  home  Recommendations for Outpatient Follow-up:  1. Follow up with PCP in 1-2 weeks 2. Patient has been set up with urology for urinary retention and she will be discharged home with Foley catheter in place 3. Patient will follow-up with [facility] as needed 4. Her husband has information for [facility] and plans on contacting them when patient's condition deteriorated  Home Health:home health resumed on discharge Equipment/Devices:foley catheter for urinary retention, hospital bed  Discharge Condition: stable CODE STATUS: DNR Diet recommendation: heart healthy, low carb  Brief/Interim Summary: 67 year old female with end-stage liver disease and Nash cirrhosis, with multiple recent admissions to [facility] for hepatic encephalopathy, presents to the emergency room with recurrent symptoms of encephalopathy.  She was admitted for further treatments.  Discharge Diagnoses:  Principal Problem:   Altered mental status Active Problems:   Type 2 diabetes mellitus, uncontrolled (HCC)   Hypothyroidism   Cirrhosis of liver with ascites (HCC)   Protein-calorie malnutrition, severe ([place])   Acute hepatic encephalopathy   Hepatic encephalopathy (HCC)   Acute urinary retention   Goals of care, counseling/discussion   Palliative care by specialist   DNR (do not resuscitate) discussion   Hyperbilirubinemia   Advanced care planning/counseling discussion  1. Hepatic encephalopathy.  Patient was followed by GI during her hospitalization.  She was started on lactulose, Xifaxan as well as Linzess.  She also received lactulose enemas.  Overall mental status has improved.  Mental status still does wax and wane, but for the most part she has been more clear and interactive in  conversation.  She will be continued on lactulose, Xifaxan and Linzess. 2. Cirrhosis secondary to [place].  Her case was discussed by GI team with liver transplant team at [facility] and they were notified that patient is no longer a transplant candidate.  She can follow-up with gastroenterology clinic at [facility] as needed.  She did undergo paracentesis on 10/21 with removal of 3.9 L of fluid.  She is continued on Lasix and Aldactone. 3. GERD.  Treated with Protonix 4. Acute kidney injury.  Likely secondary to acute urinary retention.  Patient was placed with Foley catheter.  Creatinine is back now normal range.  She will be discharged home with a Foley catheter and follow-up with urology has been arranged. 5. Chronic systolic congestive heart failure.  She does not have any shortness of breath.  Overall volume status is stable. 6. Goals of care.  Several conversations were had with palliative care, the patient's husband and the patient.  After several conversations, patient has been has agreed to make patient DNR.  Recommendations were made for hospice services.  At this time, he would like to take the patient home with home health physical therapy in order to help her become more mobile.  If she begins a decline, he already has the contact number for hospice and is familiar with them.  He will reach out to them if the condition requires.  Discharge Instructions  Discharge Instructions    Diet - low sodium heart healthy   Complete by:  As directed    Increase activity slowly   Complete by:  As directed      Allergies as of 09/29/2018      Reactions   Exenatide Nausea And Vomiting   Ace Inhibitors Other (See Comments)  Pseudoasthma. Pharmacy asked about this and patient does not recall so not sure if true allergy      Medication List    STOP taking these medications   bumetanide 2 MG tablet Commonly known as:  BUMEX   GENERLAC 10 GM/15ML Soln Generic drug:  lactulose (encephalopathy)    KRISTALOSE 20 g packet Generic drug:  lactulose Replaced by:  lactulose 10 GM/15ML solution     TAKE these medications   albuterol (2.5 MG/3ML) 0.083% nebulizer solution Commonly known as:  PROVENTIL Take 3 mLs (2.5 mg total) by nebulization every 2 (two) hours as needed for wheezing.   albuterol 108 (90 Base) MCG/ACT inhaler Commonly known as:  PROVENTIL HFA;VENTOLIN HFA Inhale 2 puffs into the lungs every 6 (six) hours as needed for wheezing or shortness of breath.   DULoxetine 30 MG capsule Commonly known as:  CYMBALTA Take 30 mg by mouth daily with lunch. In conjunction with one 60 mg capsule to equal a total of 90 milligrams   DULoxetine 60 MG capsule Commonly known as:  CYMBALTA Take 60 mg by mouth daily with lunch. In conjunction with one 30 mg capsule to equal a total of 90 milligrams   furosemide 40 MG tablet Commonly known as:  LASIX Take 1 tablet (40 mg total) by mouth daily.   HUMALOG MIX 75/25 KWIKPEN (75-25) 100 UNIT/ML Kwikpen Generic drug:  Insulin Lispro Prot & Lispro Inject 10 Units into the skin 2 (two) times daily. What changed:  how much to take   ketoconazole 2 % cream Commonly known as:  NIZORAL Apply 1 application topically daily as needed for irritation.   lactulose 10 GM/15ML solution Commonly known as:  CHRONULAC Take 30 mLs (20 g total) by mouth 4 (four) times daily. Replaces:  KRISTALOSE 20 g packet   levothyroxine 75 MCG tablet Commonly known as:  SYNTHROID, LEVOTHROID Take 75 mcg by mouth daily before breakfast.   linaclotide 145 MCG Caps capsule Commonly known as:  LINZESS Take 1 capsule (145 mcg total) by mouth daily before breakfast. Start taking on:  09/30/2018   mometasone-formoterol 100-5 MCG/ACT Aero Commonly known as:  DULERA Inhale 2 puffs into the lungs 2 (two) times daily as needed for wheezing or shortness of breath.   NON FORMULARY Apply 1 application topically daily as needed (for muscle cramps; Theraworx (Olibanum  8x HPUS)).   ondansetron 4 MG disintegrating tablet Commonly known as:  ZOFRAN-ODT Take 4 mg by mouth every 6 (six) hours as needed for nausea or vomiting.   pantoprazole 40 MG tablet Commonly known as:  PROTONIX Take 40 mg by mouth 2 (two) times daily.   Propylene Glycol 0.6 % Soln Apply 1 drop to eye daily as needed (for dry eye relief).   spironolactone 100 MG tablet Commonly known as:  ALDACTONE Take 1 tablet (100 mg total) by mouth daily. What changed:    medication strength  how much to take   XIFAXAN 550 MG Tabs tablet Generic drug:  rifaximin Take 1 tablet (550 mg total) by mouth 2 (two) times daily.            Durable Medical Equipment  (From admission, onward)         Start     Ordered   09/29/18 1239  For home use only DME Hospital bed  Once    Question Answer Comment  Patient has (list medical condition): end stage liver disease   The above medical condition requires: Patient requires the ability to  reposition frequently   Head must be elevated greater than: 30 degrees   Bed type Semi-electric      09/29/18 1238         Follow-up Information    [facility].   Why:  The office will be calling the patient with an appointment. Contact information: [address] [place] [phone]         Allergies  Allergen Reactions  . Exenatide Nausea And Vomiting  . Ace Inhibitors Other (See Comments)    Pseudoasthma. Pharmacy asked about this and patient does not recall so not sure if true allergy    Consultations:  Gastroenterology  Palliative care   Procedures/Studies: Dg Chest 1 View  Result Date: 09/21/2018 CLINICAL DATA:  Altered mental status, cough and congestion, history coronary artery disease post MI, cirrhosis, COPD, type II diabetes mellitus, hypertension EXAM: CHEST  1 VIEW COMPARISON:  Portable exam 1243 hours compared to 07/08/2018 FINDINGS: Normal heart size post median  sternotomy, MVR and AVR. Mediastinal contours and pulmonary vascularity normal. Low lung volumes with mild RIGHT basilar atelectasis. No definite infiltrate, pleural effusion or pneumothorax. Bones demineralized. IMPRESSION: Low lung volumes with RIGHT basilar atelectasis. Electronically Signed   By: Lavonia Dana M.D.   On: 09/21/2018 13:04   Ct Head Wo Contrast  Result Date: 09/21/2018 CLINICAL DATA:  Altered level of consciousness. EXAM: CT HEAD WITHOUT CONTRAST TECHNIQUE: Contiguous axial images were obtained from the base of the skull through the vertex without intravenous contrast. COMPARISON:  CT scan of December 17, 2017. FINDINGS: Brain: No evidence of acute infarction, hemorrhage, hydrocephalus, extra-axial collection or mass lesion/mass effect. Vascular: No hyperdense vessel or unexpected calcification. Skull: Normal. Negative for fracture or focal lesion. Sinuses/Orbits: No acute finding. Other: None. IMPRESSION: Normal head CT. Electronically Signed   By: Marijo Conception, M.D.   On: 09/21/2018 14:11   US Paracentesis  Result Date: 09/28/2018 INDICATION: Idiopathic cirrhosis, ascites, CHF, COPD, diabetes mellitus, hypertension EXAM: ULTRASOUND GUIDED DIAGNOSTIC AND THERAPEUTIC PARACENTESIS MEDICATIONS: None. COMPLICATIONS: None immediate. PROCEDURE: Procedure, benefits, and risks of procedure were discussed with patient. Written informed consent for procedure was obtained. Time out protocol followed. Adequate collection of ascites localized by ultrasound in RIGHT lower quadrant. Skin prepped and draped in usual sterile fashion. Skin and soft tissues anesthetized with 10 mL of 1% lidocaine. 5 [place] Yueh catheter placed into peritoneal cavity. 3.9 L of yellow ascitic fluid fluid aspirated by vacuum bottle suction. Procedure tolerated well by patient without immediate complication. FINDINGS: A total of approximately 3.9 L of ascitic fluid was removed. Samples were sent to the laboratory as requested  by the clinical team. IMPRESSION: Successful ultrasound-guided paracentesis yielding 3.9 liters of peritoneal fluid. Electronically Signed   By: Lavonia Dana M.D.   On: 09/28/2018 15:18       Subjective: Patient is more awake today and engages in conversation. Less abdominal discomfort after paracentesis yesterday. Wants to go home.  Discharge Exam: Vitals:   09/28/18 2132 09/29/18 0429 09/29/18 0558 09/29/18 1419  BP: 102/63  128/66 105/61  Pulse: 85  92 90  Resp:    18  Temp: 98.3 F (36.8 C)  98.1 F (36.7 C)   TempSrc: Oral  Oral   SpO2: 100%  99%   Weight:  65.7 kg    Height:        General: Pt is alert, awake, not in acute distress Cardiovascular: RRR, S1/S2 +, no rubs, no gallops Respiratory:  CTA bilaterally, no wheezing, no rhonchi Abdominal: Soft, NT, mild distention, bowel sounds + Extremities: no edema, no cyanosis    The results of significant diagnostics from this hospitalization (including imaging, microbiology, ancillary and laboratory) are listed below for reference.     Microbiology: Recent Results (from the past 240 hour(s))  Culture, body fluid-bottle     Status: None (Preliminary result)   Collection Time: 09/28/18  2:18 PM  Result Value Ref Range Status   Specimen Description ASCITIC  Final   Special Requests BOTTLES DRAWN AEROBIC AND ANAEROBIC 10CC  Final   Culture   Final    NO GROWTH < 24 HOURS Performed at [facility], [address], [place], [place] [address]    Report Status PENDING  Incomplete  Gram stain     Status: None   Collection Time: 09/28/18  2:18 PM  Result Value Ref Range Status   Specimen Description ASCITIC  Final   Special Requests 10CC  Final   Gram Stain   Final    CYTOSPIN SMEAR NO ORGANISMS SEEN WBC PRESENT, PREDOMINANTLY MONONUCLEAR Performed at [facility], [address], [place], [place] [address]    Report Status 09/28/2018 FINAL  Final     Labs: BNP (last 3 results) Recent Labs    10/15/17 1304  10/15/17 1843  BNP 125.3* 662.9*   Basic Metabolic Panel: Recent Labs  Lab 09/23/18 0442 09/24/18 0459 09/25/18 0459 09/26/18 0702 09/28/18 0623 09/29/18 0635  NA 135 134* 133* 131* 131* 127*  K 3.3* 3.4* 3.8 4.1 3.6 3.5  CL 96* 99 101 97* 99 97*  CO2 30 25 28 27 23  20*  GLUCOSE 146* 197* 199* 168* 183* 179*  BUN 21 19 15 15 18 18   CREATININE 1.29* 1.07* 0.96 1.00 1.06* 0.96  CALCIUM 8.2* 8.2* 7.8* 8.0* 8.1* 7.7*  MG 2.2 2.2  --   --   --   --    Liver Function Tests: Recent Labs  Lab 09/24/18 0459 09/25/18 0459 09/26/18 0702 09/28/18 0623 09/29/18 0635  AST 54* 61* 71* 83* 77*  ALT 23 26 28  32 31  ALKPHOS 131* 127* 125 134* 143*  BILITOT 5.3* 5.5* 5.5* 5.0* 4.4*  PROT 5.1* 5.1* 5.1* 5.1* 4.9*  ALBUMIN 2.3* 2.3* 2.3* 2.2* 2.1*   No results for input(s): LIPASE, AMYLASE in the last 168 hours. Recent Labs  Lab 09/23/18 0442 09/24/18 0459 09/25/18 0459 09/26/18 0702  AMMONIA 56* 73* 60* 37*   CBC: Recent Labs  Lab 09/23/18 0442 09/24/18 0459 09/25/18 0459 09/26/18 0702 09/29/18 0635  WBC 6.8 7.1 9.1 8.8 9.9  NEUTROABS 3.9 4.3 5.1 5.1 5.6  HGB 10.4* 10.6* 10.6* 10.8* 10.9*  HCT 32.4* 32.5* 32.3* 32.8* 33.0*  MCV 99.4 98.2 96.4 98.5 97.1  PLT 79* 76* 87* 82* 82*   Cardiac Enzymes: No results for input(s): CKTOTAL, CKMB, CKMBINDEX, TROPONINI in the last 168 hours. BNP: Invalid input(s): POCBNP CBG: Recent Labs  Lab 09/29/18 0006 09/29/18 0423 09/29/18 0729 09/29/18 1127 09/29/18 1617  GLUCAP 202* 185* 165* 267* 231*   D-Dimer No results for input(s): DDIMER in the last 72 hours. Hgb A1c No results for input(s): HGBA1C in the last 72 hours. Lipid Profile No results for input(s): CHOL, HDL, LDLCALC, TRIG, CHOLHDL, LDLDIRECT in the last 72 hours. Thyroid function studies No results for input(s): TSH, T4TOTAL, T3FREE, THYROIDAB in the last 72 hours.  Invalid input(s): FREET3 Anemia work up No results for input(s): VITAMINB12, FOLATE,  FERRITIN, TIBC, IRON,  RETICCTPCT in the last 72 hours. Urinalysis    Component Value Date/Time   COLORURINE AMBER (A) 09/21/2018 1200   APPEARANCEUR HAZY (A) 09/21/2018 1200   LABSPEC 1.018 09/21/2018 1200   PHURINE 5.0 09/21/2018 1200   GLUCOSEU 50 (A) 09/21/2018 1200   HGBUR NEGATIVE 09/21/2018 1200   BILIRUBINUR NEGATIVE 09/21/2018 1200   KETONESUR NEGATIVE 09/21/2018 1200   PROTEINUR NEGATIVE 09/21/2018 1200   UROBILINOGEN 1.0 02/02/2015 1539   NITRITE NEGATIVE 09/21/2018 1200   LEUKOCYTESUR NEGATIVE 09/21/2018 1200   Sepsis Labs Invalid input(s): PROCALCITONIN,  WBC,  LACTICIDVEN Microbiology Recent Results (from the past 240 hour(s))  Culture, body fluid-bottle     Status: None (Preliminary result)   Collection Time: 09/28/18  2:18 PM  Result Value Ref Range Status   Specimen Description ASCITIC  Final   Special Requests BOTTLES DRAWN AEROBIC AND ANAEROBIC 10CC  Final   Culture   Final    NO GROWTH < 24 HOURS Performed at [facility], [address], [place], [place] [address]    Report Status PENDING  Incomplete  Gram stain     Status: None   Collection Time: 09/28/18  2:18 PM  Result Value Ref Range Status   Specimen Description ASCITIC  Final   Special Requests 10CC  Final   Gram Stain   Final    CYTOSPIN SMEAR NO ORGANISMS SEEN WBC PRESENT, PREDOMINANTLY MONONUCLEAR Performed at [facility], [address], [place],  [address]    Report Status 09/28/2018 FINAL  Final     Time coordinating discharge: 69mins  SIGNED:   Kathie Dike, MD  [facility] 09/29/2018, 5:07 PM Pager   If 7PM-7AM, please contact night-coverage [email or web address] Password [email or web address]

## 2018-09-29 NOTE — Progress Notes (Signed)
PT Cancellation Note  Patient Details Name: Denise Cuevas MRN: [id number] DOB: 01-27-1951   Cancelled Treatment:    Reason Eval/Treat Not Completed: Other (comment). Pt was unwilling to expend her limited energy on treatment today.  Try at another time as pt allows.   Ramond Dial 09/29/2018, 2:48 PM   2:49 PM, 09/29/18 Mee Hives, PT, MS Physical Therapist - [place] [phone] (Office)

## 2018-09-29 NOTE — Progress Notes (Signed)
Subjective: Still slower to respond but making jokes with husband and myself at bedside. Knows the hospital, city, president. Knows her own DOB, does not know the year. Feels better after para yesterday. Wants to go home.   Objective: Vital signs in last 24 hours: Temp:  [97.8 F (36.6 C)-98.3 F (36.8 C)] 98.1 F (36.7 C) (10/22 0558) Pulse Rate:  [85-92] 92 (10/22 0558) Resp:  [16-20] 16 (10/21 1539) BP: (102-128)/(55-66) 128/66 (10/22 0558) SpO2:  [99 %-100 %] 99 % (10/22 0558) Weight:  [65.7 kg] 65.7 kg (10/22 0429) Last BM Date: 09/28/18 General:   Alert and oriented to person, place, situation, not year. Chronically-ill appearing, sallow complexion.  Head:  Normocephalic and atraumatic. Eyes:  +scleral icterus  Abdomen:  Bowel sounds present, soft, non-tender, non-distended. Extremities:  Without edema. Neurologic:  Alert and  oriented to person, place, situation, slowed responses but appropriate.    Intake/Output from previous day: 10/21 0701 - 10/22 0700 In: 723 [P.O.:720; I.V.:3] Out: 475 [Urine:475] Intake/Output this shift: No intake/output data recorded.  Lab Results: Recent Labs    09/29/18 0635  WBC 9.9  HGB 10.9*  HCT 33.0*  PLT 82*   BMET Recent Labs    09/28/18 0623 09/29/18 0635  NA 131* 127*  K 3.6 3.5  CL 99 97*  CO2 23 20*  GLUCOSE 183* 179*  BUN 18 18  CREATININE 1.06* 0.96  CALCIUM 8.1* 7.7*   LFT Recent Labs    09/28/18 0623 09/29/18 0635  PROT 5.1* 4.9*  ALBUMIN 2.2* 2.1*  AST 83* 77*  ALT 32 31  ALKPHOS 134* 143*  BILITOT 5.0* 4.4*   PT/INR Recent Labs    09/29/18 0635  LABPROT 18.1*  INR 1.51     Studies/Results: US Paracentesis  Result Date: 09/28/2018 INDICATION: Idiopathic cirrhosis, ascites, CHF, COPD, diabetes mellitus, hypertension EXAM: ULTRASOUND GUIDED DIAGNOSTIC AND THERAPEUTIC PARACENTESIS MEDICATIONS: None. COMPLICATIONS: None immediate. PROCEDURE: Procedure, benefits, and risks of procedure  were discussed with patient. Written informed consent for procedure was obtained. Time out protocol followed. Adequate collection of ascites localized by ultrasound in RIGHT lower quadrant. Skin prepped and draped in usual sterile fashion. Skin and soft tissues anesthetized with 10 mL of 1% lidocaine. 5 [place] Yueh catheter placed into peritoneal cavity. 3.9 L of yellow ascitic fluid fluid aspirated by vacuum bottle suction. Procedure tolerated well by patient without immediate complication. FINDINGS: A total of approximately 3.9 L of ascitic fluid was removed. Samples were sent to the laboratory as requested by the clinical team. IMPRESSION: Successful ultrasound-guided paracentesis yielding 3.9 liters of peritoneal fluid. Electronically Signed   By: Lavonia Dana M.D.   On: 09/28/2018 15:18    Assessment: 67 year old female admitted with hepatic encephalopathy secondary to decompensated liver disease. Not a transplant candidate. Continues with improved mentation, much improved from yesterday.  Husband at bedside and desires for her to be home and follow-up with [facility] if possible before deciding on hospice.   Hyponatremia: multifactorial. Has already received lasix 40 mg and aldactone 100 mg this morning. Follow closely, titrating diuretics as needed.   Ascites: s/p paracentesis yesterday with 3.9 liters removed. Negative cell count.   Encephalopathy: much improved. Continue with oral lactulose, Xifaxan BID, Linzess. Will stop enemas for now.   Will follow peripherally at this point; hopefully, she will be discharged soon with hospice, SNF.    Plan: Continue lactulose orally, Xifaxan BID, Linzess Follow BMP as outpatient Discontinue lactulose enemas Follow-up at [facility] if health allows  at husband's request Will follow peripherally   Annitta Needs, PhD, ANP-BC [facility]     LOS: 7 days    09/29/2018, 9:29 AM

## 2018-09-29 NOTE — Care Management (Addendum)
Pt's husband still uncertain if he will choose HH vs Hospice in the end. CM discussed with Ron, Hospice rep, who will have visit made to pt's home once DC'd. Pt needs hospital bed, CM has sent referral to [facility] for easy transfer if admitted to hospice. Car. Apoth. Aware bed need to be delivered to pt home prior to DC. Potential DC home today. Pt will need EMS transport home.   Addendum: Per husbands conversation with MD. Husband will plan to resume [facility] services through [facility], aware they have 48 hrs to make first resumption visit. With plan to call in hospice if pt does not do well.

## 2018-09-29 NOTE — Progress Notes (Signed)
Pt discharged home today per Dr. Roderic Palau. Pt's IV site D/C'd and WDL. Pt's VSS. Pt's foley WDL. Pt provided with home medication list, discharge instructions and prescriptions. Verbalized understanding. Pt currently awaiting arrival of EMS to transport home.

## 2018-10-03 LAB — CULTURE, BODY FLUID-BOTTLE

## 2018-10-03 LAB — CULTURE, BODY FLUID W GRAM STAIN -BOTTLE: Culture: NO GROWTH

## 2018-10-22 IMAGING — DX DG CHEST 1V PORT
1 series · 1 of 1 positions shown · non-contrast
Comparison: 11/12/2017

CLINICAL DATA: Postop

EXAM:
PORTABLE CHEST 1 VIEW

[chest ap]
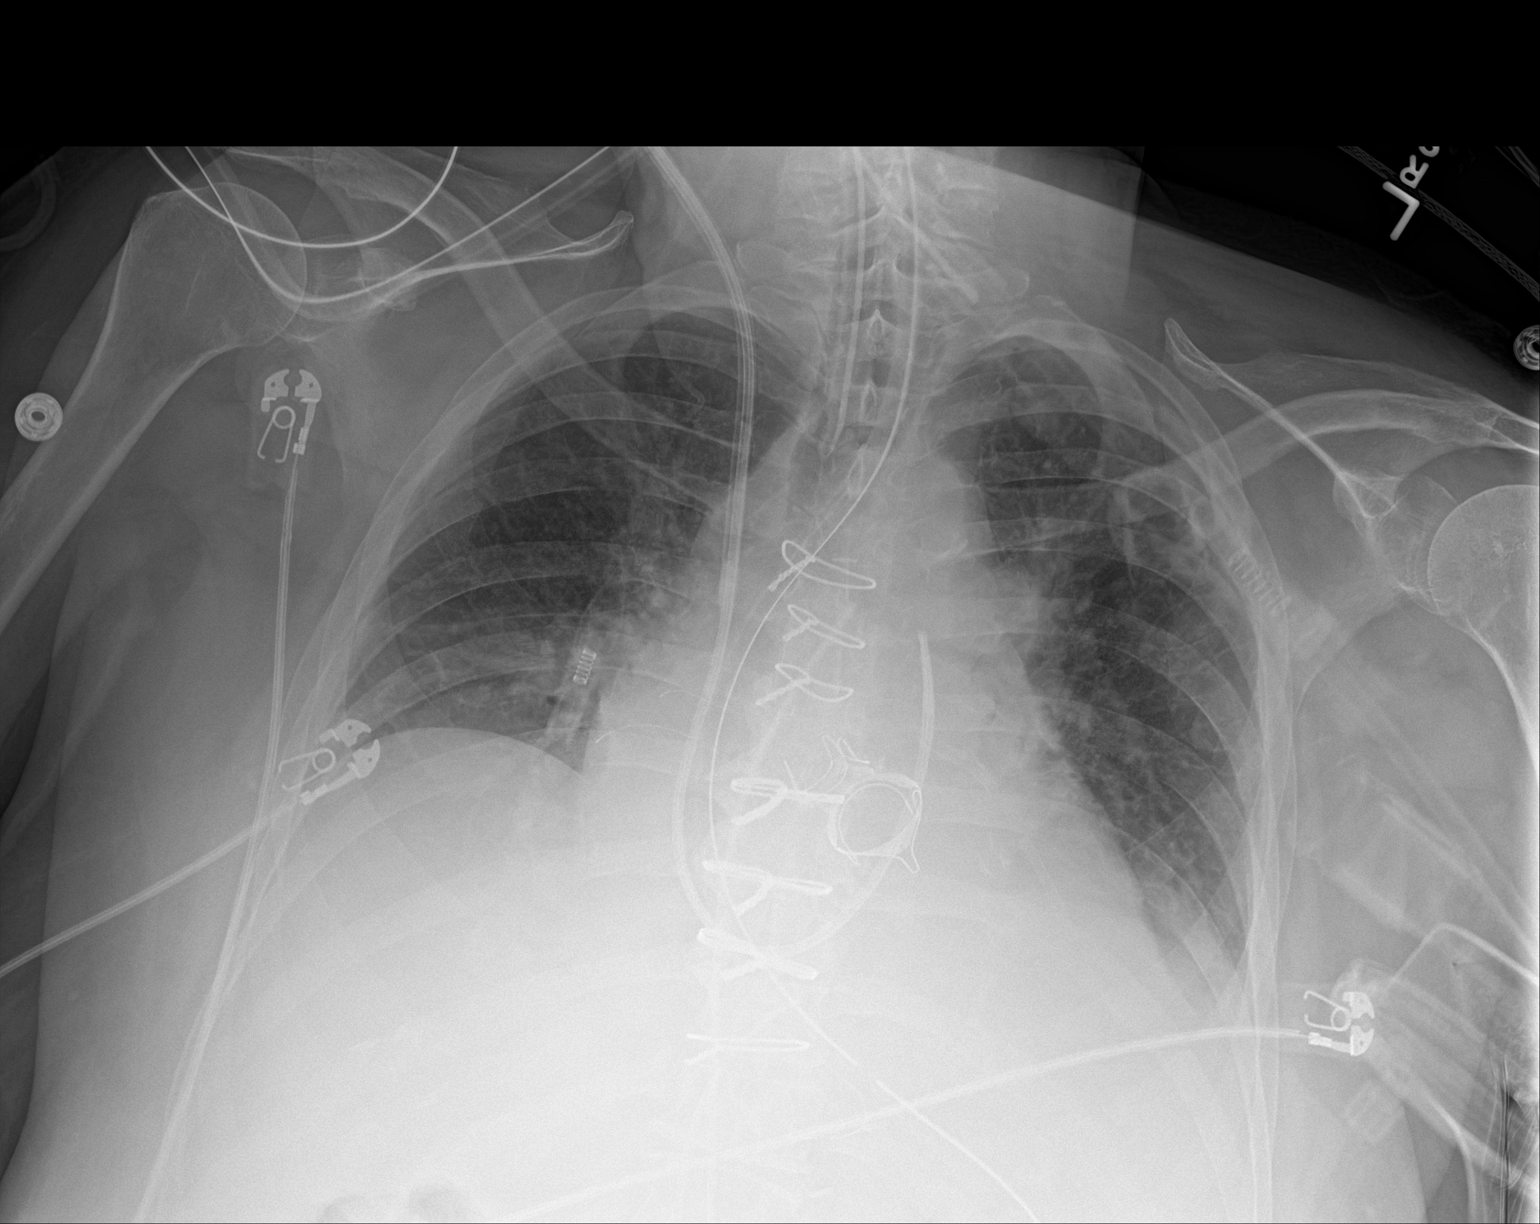

[1 of 1 positions shown; findings below may reference images not displayed]

FINDINGS: Cardiomegaly with vascular congestion and bibasilar atelectasis.
Improving aeration since prior study. No pneumothorax. Support
devices are unchanged.
IMPRESSION: Improving aeration. Continued vascular congestion and bibasilar
atelectasis.

## 2018-10-24 IMAGING — DX DG CHEST 1V PORT
1 series · 1 of 1 positions shown · non-contrast
Comparison: 11/14/2017 and older exams

CLINICAL DATA: Pleural effusion.

Mitral valve and aortic valve replacement on 11/12/2017
EXAM:
PORTABLE CHEST 1 VIEW

[chest ap]
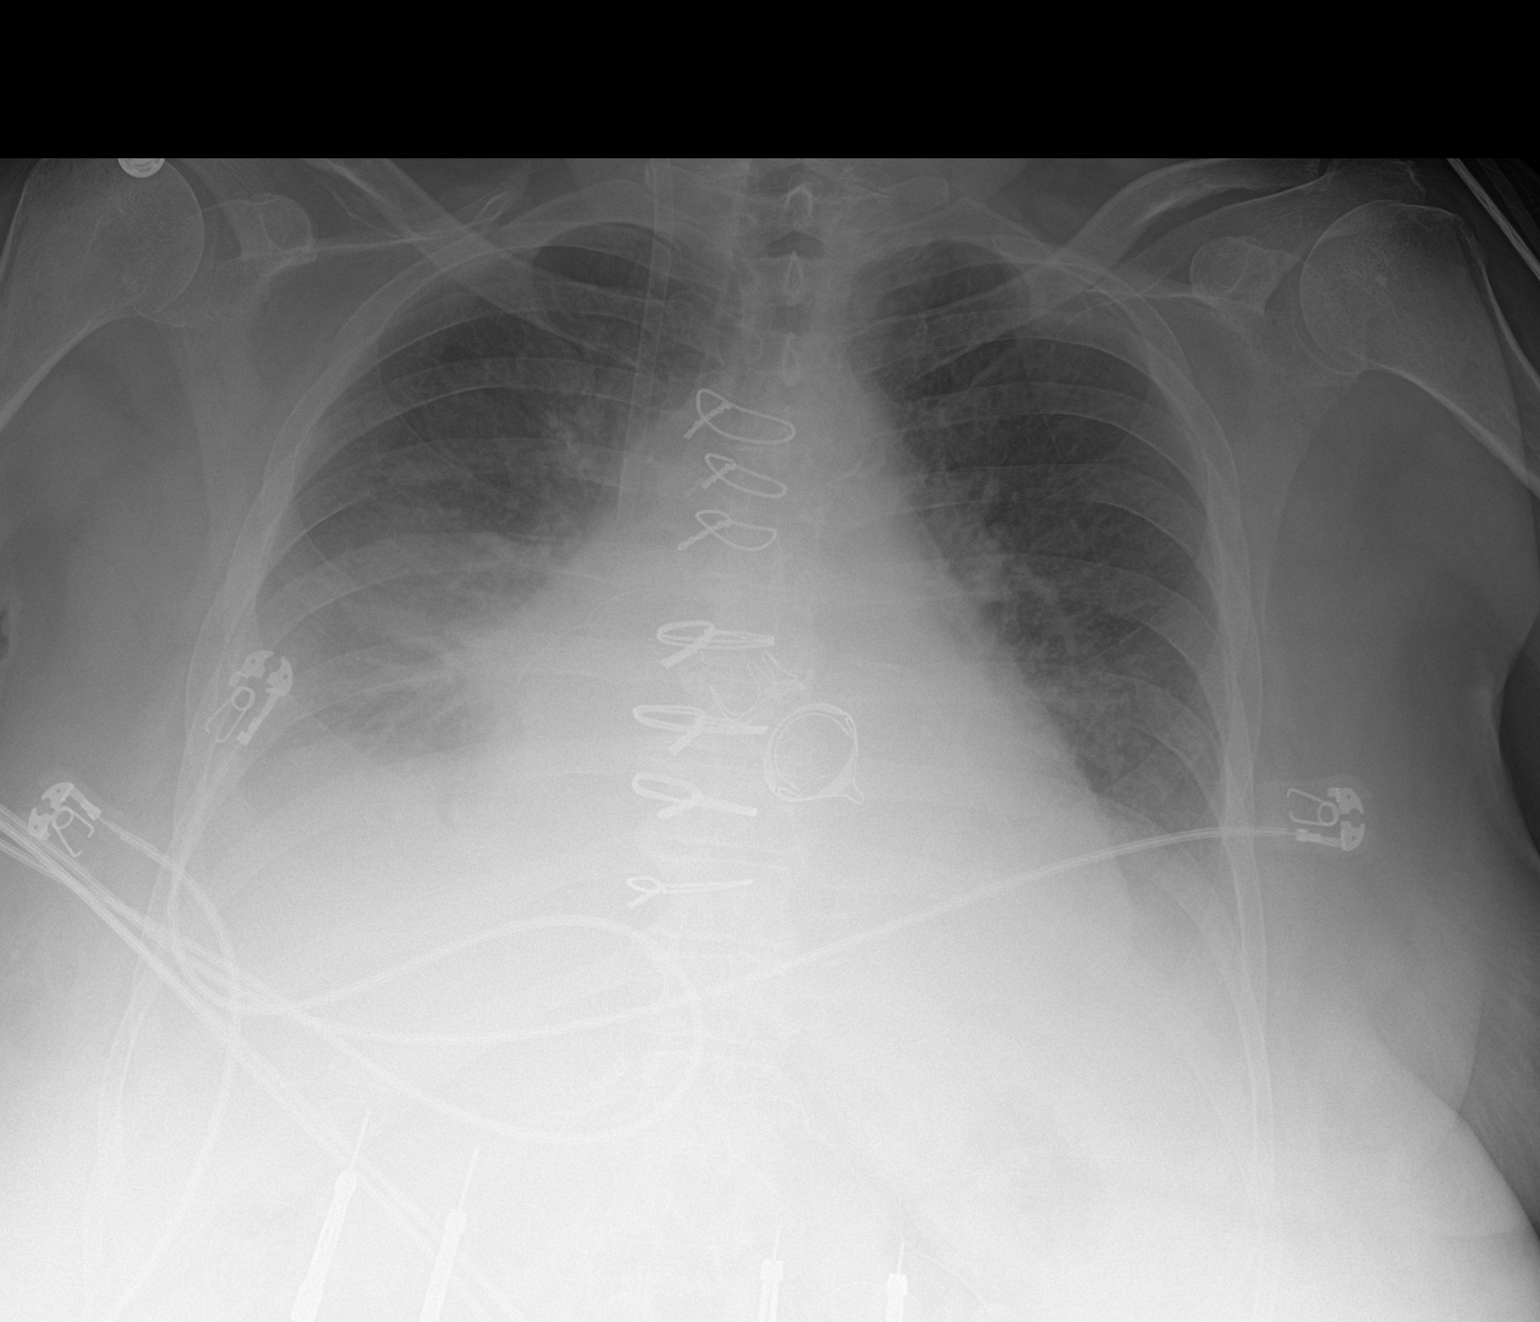

[1 of 1 positions shown; findings below may reference images not displayed]

FINDINGS: Changes from the recent cardiac surgery are again noted. There is no
mediastinal widening. Cardiac silhouette is mildly enlarged, stable.

There is increased opacity at the lung bases. This is likely
combination of small effusions with atelectasis. No convincing
pulmonary edema.

No pneumothorax.

Right internal jugular introducer sheath is stable.
IMPRESSION: 1. Mild worsening in lung aeration since the prior study with
increased lung base opacity, greater on the right, likely due to a
combination of small pleural effusions with atelectasis.
2. No convincing pulmonary edema. No pneumothorax. No mediastinal
widening.

## 2018-10-29 IMAGING — DX DG CHEST 1V PORT
1 series · 1 of 1 positions shown · non-contrast
Comparison: 11/17/2017 and earlier.

CLINICAL DATA: 66-year-old female postoperative day 8 status post
aortic and mitral valve replacement.

EXAM:
PORTABLE CHEST 1 VIEW

[chest ap]
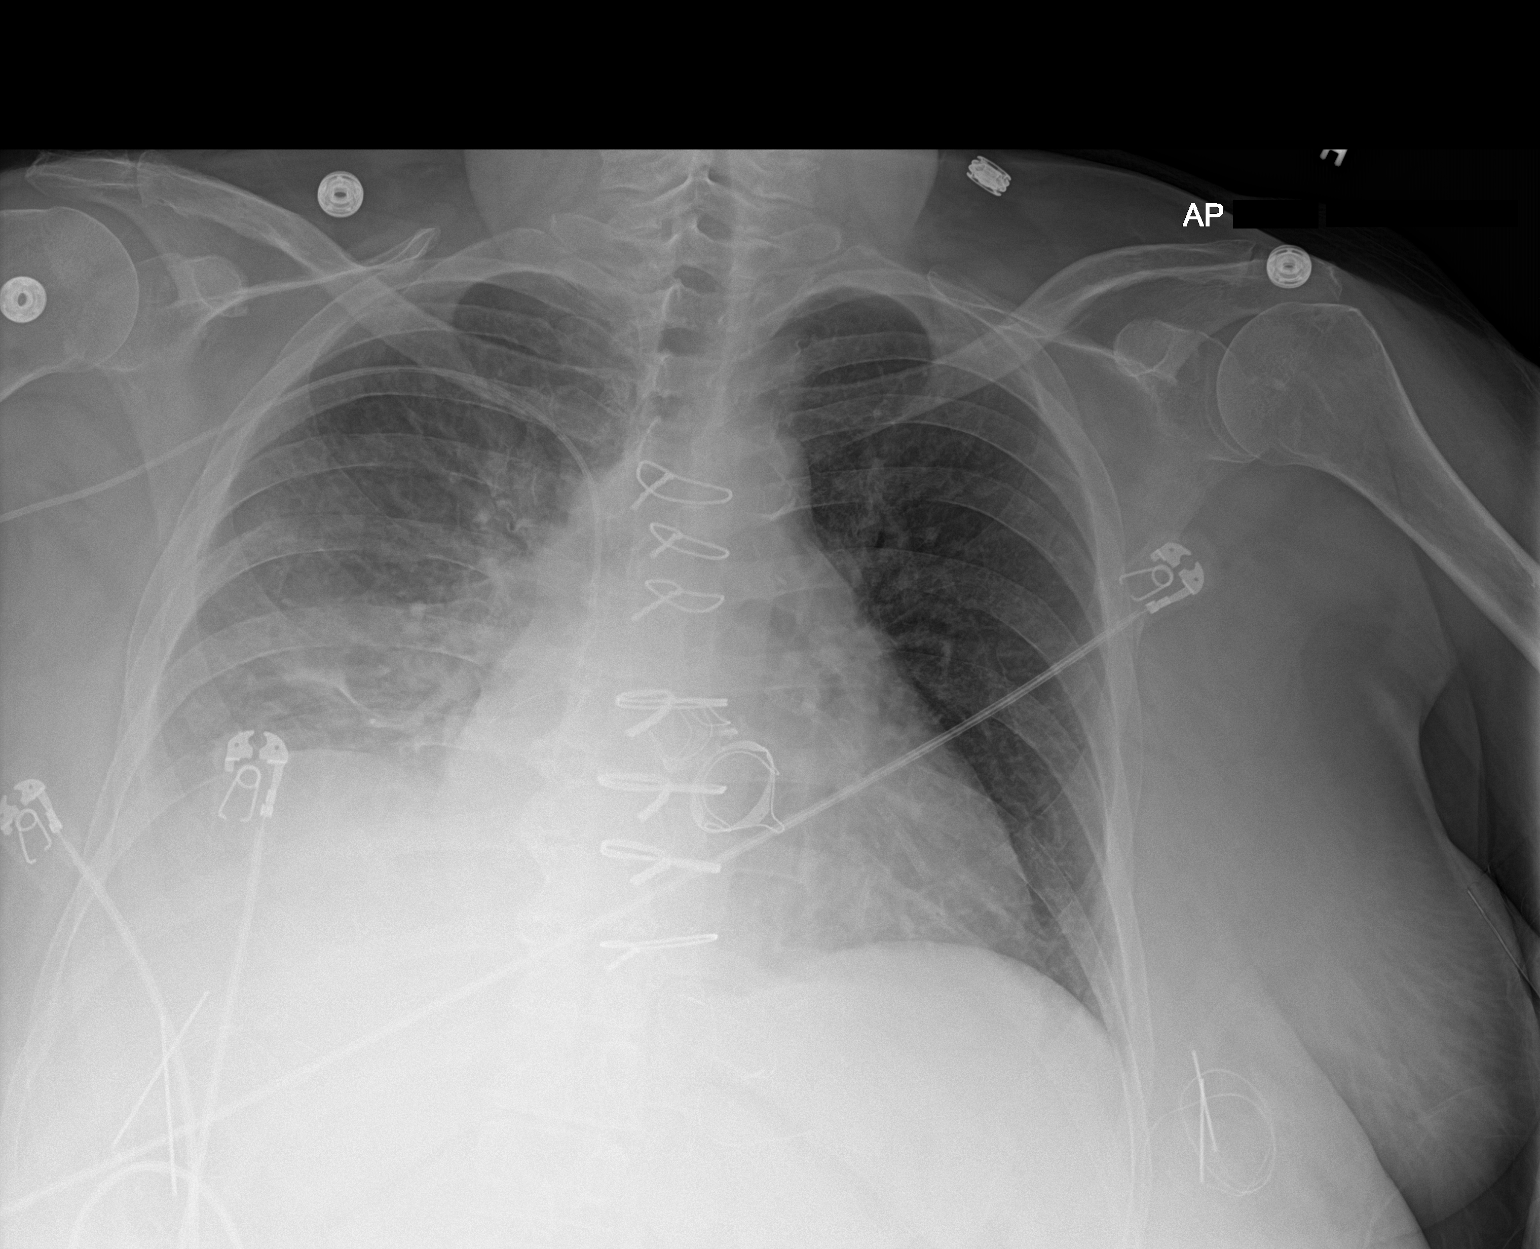

[1 of 1 positions shown; findings below may reference images not displayed]

FINDINGS: Portable AP semi upright view at 7009 hours. Right IJ introducer
sheath removed. Stable right PICC line. Stable cardiac size and
mediastinal contours. Prosthetic mitral and aortic valves. Continued
veiling opacity at the right lung base. No pulmonary edema. No
pneumothorax. Epicardial pacer wires in place. Allowing for portable
technique the left lung is clear.
IMPRESSION: 1. Right IJ introducer sheath removed.  Right PICC line remains.
2. Suspect small right pleural effusion with atelectasis. No other
acute cardiopulmonary abnormality.

## 2018-11-08 DEATH — deceased

## 2018-12-10 IMAGING — CR DG CHEST 2V
2 series · 2 of 2 positions shown · non-contrast
Comparison: [DATE]

CLINICAL DATA: Status post coronary bypass grafting with shortness
of Breath

EXAM:
CHEST  2 VIEW

[w chest pa]
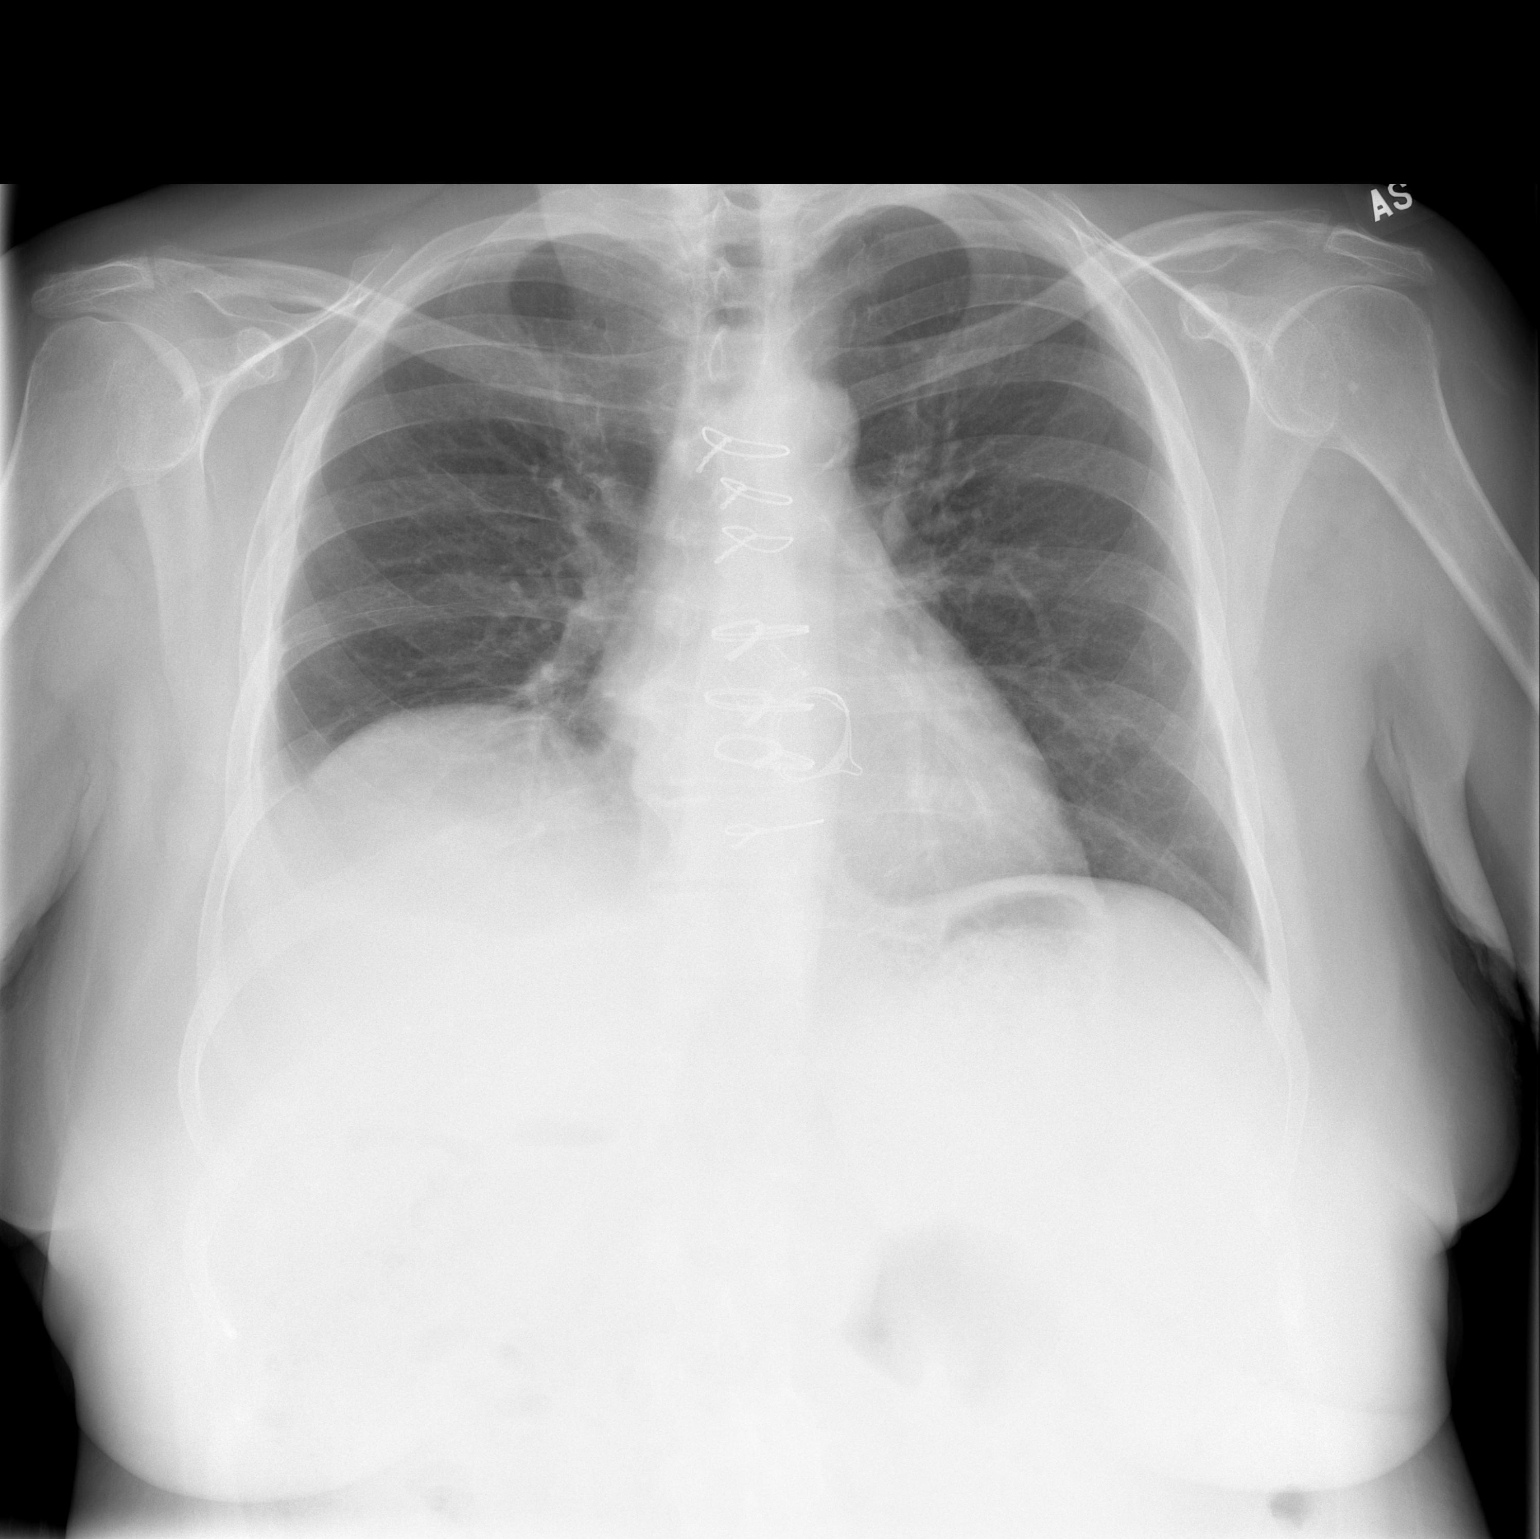

[w chest lat]
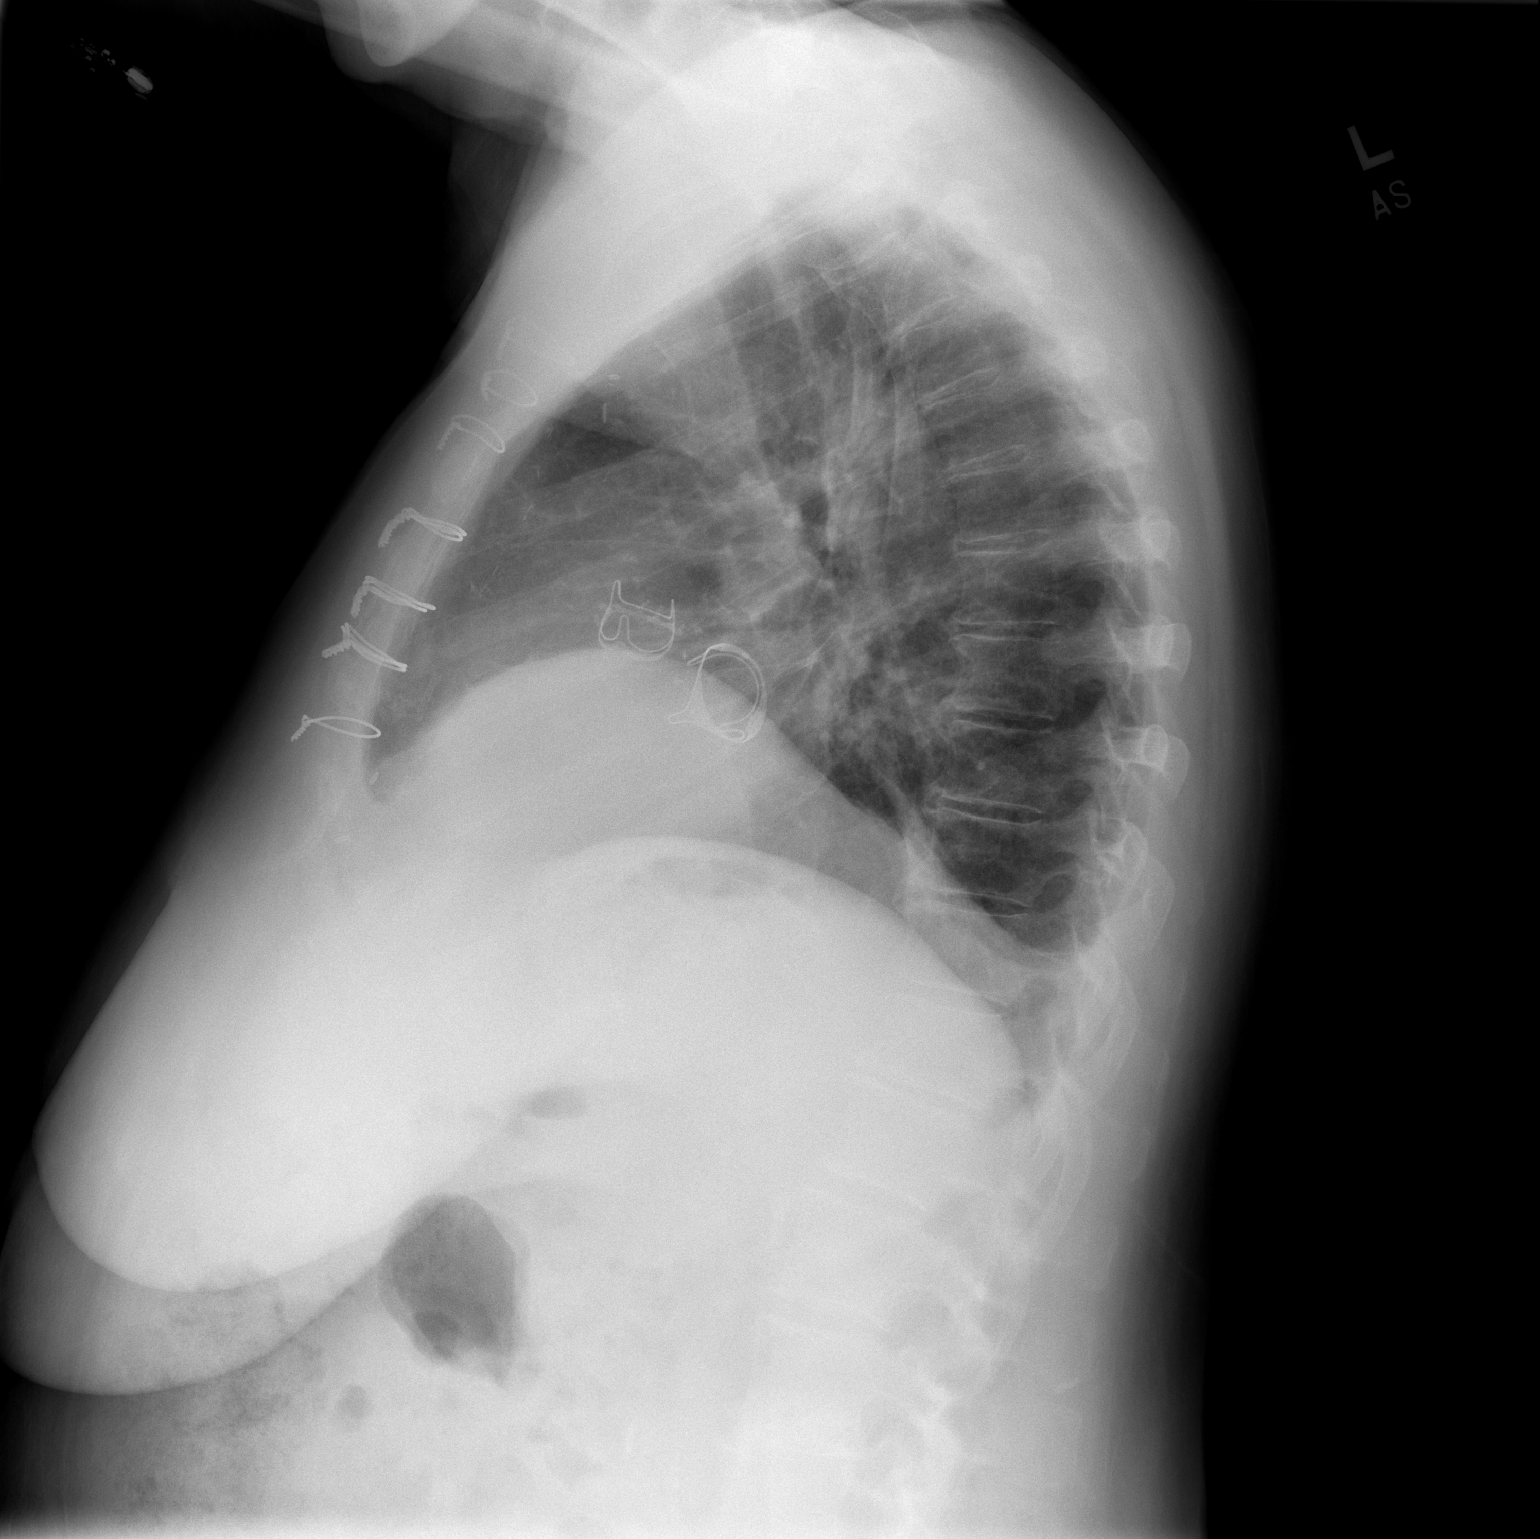

[2 of 2 positions shown; findings below may reference images not displayed]

FINDINGS: Cardiac shadow is within normal limits. Postsurgical changes are
again seen and stable. Elevation the right hemidiaphragm is noted.
Small right-sided pleural effusion is seen. There is likely some
underlying atelectasis/early infiltrate. The left lung is clear. No
bony abnormality is noted.
IMPRESSION: Small right-sided pleural effusion with right basilar changes.
